# Patient Record
Sex: Female | Born: 1950 | Race: White | Hispanic: No | Smoking: Never smoker
Health system: Southern US, Community
[De-identification: ages and names within clinical notes are randomized; demographics above are authoritative.]

## PROBLEM LIST (undated history)

## (undated) DIAGNOSIS — E785 Hyperlipidemia, unspecified: Secondary | ICD-10-CM

## (undated) DIAGNOSIS — G4700 Insomnia, unspecified: Secondary | ICD-10-CM

## (undated) DIAGNOSIS — C801 Malignant (primary) neoplasm, unspecified: Secondary | ICD-10-CM

## (undated) DIAGNOSIS — N946 Dysmenorrhea, unspecified: Secondary | ICD-10-CM

## (undated) DIAGNOSIS — F419 Anxiety disorder, unspecified: Secondary | ICD-10-CM

## (undated) DIAGNOSIS — C779 Secondary and unspecified malignant neoplasm of lymph node, unspecified: Secondary | ICD-10-CM

## (undated) DIAGNOSIS — E2740 Unspecified adrenocortical insufficiency: Secondary | ICD-10-CM

## (undated) DIAGNOSIS — E538 Deficiency of other specified B group vitamins: Secondary | ICD-10-CM

## (undated) DIAGNOSIS — F329 Major depressive disorder, single episode, unspecified: Secondary | ICD-10-CM

## (undated) DIAGNOSIS — R609 Edema, unspecified: Secondary | ICD-10-CM

## (undated) DIAGNOSIS — Z9151 Personal history of suicidal behavior: Secondary | ICD-10-CM

## (undated) DIAGNOSIS — I10 Essential (primary) hypertension: Secondary | ICD-10-CM

## (undated) DIAGNOSIS — M5412 Radiculopathy, cervical region: Secondary | ICD-10-CM

## (undated) DIAGNOSIS — E271 Primary adrenocortical insufficiency: Secondary | ICD-10-CM

## (undated) DIAGNOSIS — Z915 Personal history of self-harm: Secondary | ICD-10-CM

## (undated) DIAGNOSIS — C4371 Malignant melanoma of right lower limb, including hip: Secondary | ICD-10-CM

## (undated) DIAGNOSIS — N943 Premenstrual tension syndrome: Secondary | ICD-10-CM

## (undated) DIAGNOSIS — Z9221 Personal history of antineoplastic chemotherapy: Secondary | ICD-10-CM

## (undated) DIAGNOSIS — F32A Depression, unspecified: Secondary | ICD-10-CM

## (undated) HISTORY — DX: Anxiety disorder, unspecified: F41.9

## (undated) HISTORY — PX: BREAST BIOPSY: SHX20

## (undated) HISTORY — PX: TUBAL LIGATION: SHX77

## (undated) HISTORY — DX: Premenstrual tension syndrome: N94.3

## (undated) HISTORY — DX: Secondary and unspecified malignant neoplasm of lymph node, unspecified: C77.9

## (undated) HISTORY — DX: Hyperlipidemia, unspecified: E78.5

## (undated) HISTORY — DX: Deficiency of other specified B group vitamins: E53.8

## (undated) HISTORY — DX: Insomnia, unspecified: G47.00

## (undated) HISTORY — DX: Radiculopathy, cervical region: M54.12

## (undated) HISTORY — PX: POLYPECTOMY: SHX149

## (undated) HISTORY — DX: Unspecified adrenocortical insufficiency: E27.40

## (undated) HISTORY — DX: Depression, unspecified: F32.A

## (undated) HISTORY — DX: Personal history of suicidal behavior: Z91.51

## (undated) HISTORY — DX: Personal history of self-harm: Z91.5

## (undated) HISTORY — DX: Essential (primary) hypertension: I10

## (undated) HISTORY — PX: TOTAL ABDOMINAL HYSTERECTOMY W/ BILATERAL SALPINGOOPHORECTOMY: SHX83

## (undated) HISTORY — DX: Dysmenorrhea, unspecified: N94.6

## (undated) HISTORY — DX: Malignant melanoma of right lower limb, including hip: C43.71

## (undated) HISTORY — PX: ABDOMINAL HYSTERECTOMY: SHX81

## (undated) HISTORY — DX: Personal history of antineoplastic chemotherapy: Z92.21

## (undated) HISTORY — DX: Edema, unspecified: R60.9

## (undated) HISTORY — PX: DILATION AND CURETTAGE OF UTERUS: SHX78

## (undated) HISTORY — PX: LUMBAR FUSION: SHX111

## (undated) HISTORY — DX: Primary adrenocortical insufficiency: E27.1

## (undated) HISTORY — PX: CHOLECYSTECTOMY: SHX55

## (undated) HISTORY — DX: Major depressive disorder, single episode, unspecified: F32.9

## (undated) SURGERY — PERIPHERAL VASCULAR THROMBECTOMY
Anesthesia: Moderate Sedation | Laterality: Left

---

## 1998-07-10 ENCOUNTER — Encounter: Admission: RE | Admit: 1998-07-10 | Discharge: 1998-10-09 | Payer: Self-pay | Admitting: *Deleted

## 1998-09-03 ENCOUNTER — Emergency Department (HOSPITAL_COMMUNITY): Admission: EM | Admit: 1998-09-03 | Discharge: 1998-09-03 | Payer: Self-pay | Admitting: Emergency Medicine

## 1998-10-15 ENCOUNTER — Encounter: Admission: RE | Admit: 1998-10-15 | Discharge: 1998-11-05 | Payer: Self-pay

## 1998-11-13 ENCOUNTER — Encounter: Admission: RE | Admit: 1998-11-13 | Discharge: 1999-02-11 | Payer: Self-pay | Admitting: Anesthesiology

## 1999-03-05 ENCOUNTER — Encounter: Admission: RE | Admit: 1999-03-05 | Discharge: 1999-04-11 | Payer: Self-pay | Admitting: Anesthesiology

## 1999-03-28 ENCOUNTER — Encounter: Payer: Self-pay | Admitting: Physical Medicine and Rehabilitation

## 1999-03-28 ENCOUNTER — Ambulatory Visit (HOSPITAL_COMMUNITY)
Admission: RE | Admit: 1999-03-28 | Discharge: 1999-03-28 | Payer: Self-pay | Admitting: Physical Medicine and Rehabilitation

## 1999-07-18 ENCOUNTER — Encounter: Admission: RE | Admit: 1999-07-18 | Discharge: 1999-08-22 | Payer: Self-pay | Admitting: *Deleted

## 1999-08-27 ENCOUNTER — Encounter: Admission: RE | Admit: 1999-08-27 | Discharge: 1999-09-11 | Payer: Self-pay | Admitting: *Deleted

## 1999-09-15 ENCOUNTER — Other Ambulatory Visit: Admission: RE | Admit: 1999-09-15 | Discharge: 1999-09-15 | Payer: Self-pay | Admitting: Internal Medicine

## 1999-10-20 ENCOUNTER — Encounter: Admission: RE | Admit: 1999-10-20 | Discharge: 1999-10-20 | Payer: Self-pay

## 1999-11-18 ENCOUNTER — Encounter
Admission: RE | Admit: 1999-11-18 | Discharge: 1999-12-09 | Payer: Self-pay | Admitting: Physical Medicine and Rehabilitation

## 1999-11-22 ENCOUNTER — Emergency Department (HOSPITAL_COMMUNITY): Admission: EM | Admit: 1999-11-22 | Discharge: 1999-11-23 | Payer: Self-pay | Admitting: *Deleted

## 1999-11-24 ENCOUNTER — Inpatient Hospital Stay (HOSPITAL_COMMUNITY): Admission: EM | Admit: 1999-11-24 | Discharge: 1999-12-01 | Payer: Self-pay

## 1999-11-24 ENCOUNTER — Encounter: Payer: Self-pay | Admitting: Orthopedic Surgery

## 1999-11-26 ENCOUNTER — Encounter: Payer: Self-pay | Admitting: Orthopedic Surgery

## 1999-11-27 ENCOUNTER — Encounter: Payer: Self-pay | Admitting: Orthopedic Surgery

## 2000-02-04 ENCOUNTER — Encounter: Payer: Self-pay | Admitting: Neurological Surgery

## 2000-02-04 ENCOUNTER — Encounter: Admission: RE | Admit: 2000-02-04 | Discharge: 2000-02-04 | Payer: Self-pay | Admitting: Neurological Surgery

## 2000-03-17 ENCOUNTER — Encounter: Admission: RE | Admit: 2000-03-17 | Discharge: 2000-03-17 | Payer: Self-pay | Admitting: Neurological Surgery

## 2000-03-17 ENCOUNTER — Encounter: Payer: Self-pay | Admitting: Neurological Surgery

## 2000-03-24 ENCOUNTER — Encounter: Payer: Self-pay | Admitting: Neurological Surgery

## 2000-03-24 ENCOUNTER — Encounter: Admission: RE | Admit: 2000-03-24 | Discharge: 2000-03-24 | Payer: Self-pay | Admitting: Neurological Surgery

## 2000-07-16 ENCOUNTER — Encounter: Admission: RE | Admit: 2000-07-16 | Discharge: 2000-09-22 | Payer: Self-pay | Admitting: Neurological Surgery

## 2000-10-14 ENCOUNTER — Other Ambulatory Visit: Admission: RE | Admit: 2000-10-14 | Discharge: 2000-10-14 | Payer: Self-pay | Admitting: Internal Medicine

## 2001-01-29 ENCOUNTER — Inpatient Hospital Stay (HOSPITAL_COMMUNITY): Admission: AD | Admit: 2001-01-29 | Discharge: 2001-02-01 | Payer: Self-pay | Admitting: *Deleted

## 2001-08-01 ENCOUNTER — Encounter: Payer: Self-pay | Admitting: Neurological Surgery

## 2001-08-01 ENCOUNTER — Encounter: Admission: RE | Admit: 2001-08-01 | Discharge: 2001-08-01 | Payer: Self-pay | Admitting: Neurological Surgery

## 2001-08-05 ENCOUNTER — Encounter: Payer: Self-pay | Admitting: Emergency Medicine

## 2001-08-05 ENCOUNTER — Inpatient Hospital Stay (HOSPITAL_COMMUNITY): Admission: EM | Admit: 2001-08-05 | Discharge: 2001-08-17 | Payer: Self-pay | Admitting: Emergency Medicine

## 2001-08-06 ENCOUNTER — Encounter: Payer: Self-pay | Admitting: Pulmonary Disease

## 2001-08-07 ENCOUNTER — Encounter: Payer: Self-pay | Admitting: Pulmonary Disease

## 2001-08-08 ENCOUNTER — Encounter: Payer: Self-pay | Admitting: Pulmonary Disease

## 2001-08-09 ENCOUNTER — Encounter: Payer: Self-pay | Admitting: Pulmonary Disease

## 2001-08-10 ENCOUNTER — Encounter: Payer: Self-pay | Admitting: Pulmonary Disease

## 2001-08-12 ENCOUNTER — Encounter: Payer: Self-pay | Admitting: Internal Medicine

## 2001-08-15 ENCOUNTER — Encounter: Payer: Self-pay | Admitting: Internal Medicine

## 2001-08-17 ENCOUNTER — Inpatient Hospital Stay (HOSPITAL_COMMUNITY): Admission: EM | Admit: 2001-08-17 | Discharge: 2001-08-23 | Payer: Self-pay | Admitting: Psychiatry

## 2001-08-24 ENCOUNTER — Other Ambulatory Visit (HOSPITAL_COMMUNITY): Admission: RE | Admit: 2001-08-24 | Discharge: 2001-08-29 | Payer: Self-pay | Admitting: Psychiatry

## 2001-10-07 ENCOUNTER — Encounter: Payer: Self-pay | Admitting: Neurological Surgery

## 2001-10-11 ENCOUNTER — Encounter: Payer: Self-pay | Admitting: Neurological Surgery

## 2001-10-11 ENCOUNTER — Inpatient Hospital Stay (HOSPITAL_COMMUNITY): Admission: RE | Admit: 2001-10-11 | Discharge: 2001-10-20 | Payer: Self-pay | Admitting: Neurological Surgery

## 2001-10-14 ENCOUNTER — Encounter: Payer: Self-pay | Admitting: Neurological Surgery

## 2001-10-17 ENCOUNTER — Encounter: Payer: Self-pay | Admitting: Neurological Surgery

## 2001-11-21 ENCOUNTER — Ambulatory Visit (HOSPITAL_COMMUNITY): Admission: RE | Admit: 2001-11-21 | Discharge: 2001-11-21 | Payer: Self-pay | Admitting: Neurological Surgery

## 2002-01-06 ENCOUNTER — Other Ambulatory Visit: Admission: RE | Admit: 2002-01-06 | Discharge: 2002-01-06 | Payer: Self-pay | Admitting: Internal Medicine

## 2002-06-05 ENCOUNTER — Inpatient Hospital Stay (HOSPITAL_COMMUNITY): Admission: EM | Admit: 2002-06-05 | Discharge: 2002-06-08 | Payer: Self-pay | Admitting: Psychiatry

## 2002-11-03 ENCOUNTER — Other Ambulatory Visit: Admission: RE | Admit: 2002-11-03 | Discharge: 2002-11-03 | Payer: Self-pay | Admitting: Internal Medicine

## 2003-02-06 ENCOUNTER — Encounter (INDEPENDENT_AMBULATORY_CARE_PROVIDER_SITE_OTHER): Payer: Self-pay | Admitting: Specialist

## 2003-02-07 ENCOUNTER — Inpatient Hospital Stay (HOSPITAL_COMMUNITY): Admission: RE | Admit: 2003-02-07 | Discharge: 2003-02-08 | Payer: Self-pay | Admitting: *Deleted

## 2004-02-12 ENCOUNTER — Other Ambulatory Visit (HOSPITAL_COMMUNITY): Admission: RE | Admit: 2004-02-12 | Discharge: 2004-05-12 | Payer: Self-pay | Admitting: Psychiatry

## 2004-02-13 ENCOUNTER — Ambulatory Visit: Payer: Self-pay | Admitting: Psychiatry

## 2004-04-26 ENCOUNTER — Inpatient Hospital Stay: Payer: Self-pay | Admitting: Internal Medicine

## 2004-05-16 ENCOUNTER — Ambulatory Visit (HOSPITAL_COMMUNITY): Admission: RE | Admit: 2004-05-16 | Discharge: 2004-05-16 | Payer: Self-pay | Admitting: Internal Medicine

## 2004-07-18 ENCOUNTER — Emergency Department (HOSPITAL_COMMUNITY): Admission: EM | Admit: 2004-07-18 | Discharge: 2004-07-18 | Payer: Self-pay | Admitting: Emergency Medicine

## 2005-06-10 ENCOUNTER — Encounter: Admission: RE | Admit: 2005-06-10 | Discharge: 2005-06-10 | Payer: Self-pay | Admitting: General Surgery

## 2005-06-10 ENCOUNTER — Encounter (INDEPENDENT_AMBULATORY_CARE_PROVIDER_SITE_OTHER): Payer: Self-pay | Admitting: Specialist

## 2005-12-28 ENCOUNTER — Encounter: Admission: RE | Admit: 2005-12-28 | Discharge: 2005-12-28 | Payer: Self-pay | Admitting: Internal Medicine

## 2006-07-06 ENCOUNTER — Inpatient Hospital Stay (HOSPITAL_COMMUNITY): Admission: RE | Admit: 2006-07-06 | Discharge: 2006-07-08 | Payer: Self-pay | Admitting: Psychiatry

## 2006-07-06 ENCOUNTER — Emergency Department (HOSPITAL_COMMUNITY): Admission: EM | Admit: 2006-07-06 | Discharge: 2006-07-07 | Payer: Self-pay | Admitting: *Deleted

## 2006-07-06 ENCOUNTER — Ambulatory Visit: Payer: Self-pay | Admitting: Psychiatry

## 2007-05-16 ENCOUNTER — Emergency Department (HOSPITAL_COMMUNITY): Admission: EM | Admit: 2007-05-16 | Discharge: 2007-05-17 | Payer: Self-pay | Admitting: Emergency Medicine

## 2007-05-22 ENCOUNTER — Emergency Department: Payer: Self-pay | Admitting: Emergency Medicine

## 2007-05-31 ENCOUNTER — Encounter: Admission: RE | Admit: 2007-05-31 | Discharge: 2007-05-31 | Payer: Self-pay | Admitting: Internal Medicine

## 2007-10-27 ENCOUNTER — Ambulatory Visit: Payer: Self-pay | Admitting: Internal Medicine

## 2008-07-31 ENCOUNTER — Ambulatory Visit: Payer: Self-pay | Admitting: Internal Medicine

## 2009-01-01 ENCOUNTER — Encounter: Admission: RE | Admit: 2009-01-01 | Discharge: 2009-01-01 | Payer: Self-pay | Admitting: Internal Medicine

## 2009-01-01 ENCOUNTER — Ambulatory Visit: Payer: Self-pay | Admitting: Internal Medicine

## 2009-05-09 ENCOUNTER — Inpatient Hospital Stay (HOSPITAL_COMMUNITY): Admission: EM | Admit: 2009-05-09 | Discharge: 2009-05-12 | Payer: Self-pay | Admitting: Emergency Medicine

## 2009-05-12 ENCOUNTER — Ambulatory Visit: Payer: Self-pay | Admitting: Psychiatry

## 2009-05-27 ENCOUNTER — Emergency Department (HOSPITAL_COMMUNITY): Admission: EM | Admit: 2009-05-27 | Discharge: 2009-05-27 | Payer: Self-pay | Admitting: Emergency Medicine

## 2009-05-28 ENCOUNTER — Ambulatory Visit: Payer: Self-pay | Admitting: Internal Medicine

## 2009-08-13 ENCOUNTER — Ambulatory Visit: Payer: Self-pay | Admitting: Internal Medicine

## 2009-12-31 ENCOUNTER — Ambulatory Visit: Payer: Self-pay | Admitting: Internal Medicine

## 2010-02-03 ENCOUNTER — Ambulatory Visit: Admit: 2010-02-03 | Payer: Self-pay | Admitting: Internal Medicine

## 2010-02-11 ENCOUNTER — Ambulatory Visit
Admission: RE | Admit: 2010-02-11 | Discharge: 2010-02-11 | Payer: Self-pay | Source: Home / Self Care | Attending: Internal Medicine | Admitting: Internal Medicine

## 2010-04-08 LAB — COMPREHENSIVE METABOLIC PANEL
ALT: 30 U/L (ref 0–35)
AST: 22 U/L (ref 0–37)
AST: 20 U/L (ref 0–37)
AST: 38 U/L — ABNORMAL HIGH (ref 0–37)
Albumin: 3.2 g/dL — ABNORMAL LOW (ref 3.5–5.2)
Albumin: 4 g/dL (ref 3.5–5.2)
BUN: 11 mg/dL (ref 6–23)
CO2: 25 mEq/L (ref 19–32)
CO2: 30 mEq/L (ref 19–32)
Calcium: 9.1 mg/dL (ref 8.4–10.5)
Calcium: 8.5 mg/dL (ref 8.4–10.5)
Creatinine, Ser: 0.79 mg/dL (ref 0.4–1.2)
Creatinine, Ser: 0.85 mg/dL (ref 0.4–1.2)
Creatinine, Ser: 0.86 mg/dL (ref 0.4–1.2)
GFR calc Af Amer: >60 mL/min (ref >60)
GFR calc non Af Amer: >60 mL/min (ref >60)
Glucose, Bld: 105 mg/dL — ABNORMAL HIGH (ref 70–99)
Glucose, Bld: 98 mg/dL (ref 70–99)
Potassium: 3.2 mEq/L — ABNORMAL LOW (ref 3.5–5.1)
Sodium: 140 mEq/L (ref 135–145)
Total Bilirubin: 0.5 mg/dL (ref 0.3–1.2)
Total Protein: 6.7 g/dL (ref 6.0–8.3)
Total Protein: 6.2 g/dL (ref 6.0–8.3)

## 2010-04-08 LAB — CBC
HCT: 38.3 % (ref 36.0–46.0)
HCT: 42.1 % (ref 36.0–46.0)
Hemoglobin: 14.2 g/dL (ref 12.0–15.0)
MCHC: 33.4 g/dL (ref 30.0–36.0)
MCHC: 33.7 g/dL (ref 30.0–36.0)
MCV: 91.4 fL (ref 78.0–100.0)
Platelets: 223 10*3/uL (ref 150–400)
Platelets: 282 10*3/uL (ref 150–400)
RDW: 13.8 % (ref 11.5–15.5)
RDW: 14.1 % (ref 11.5–15.5)
WBC: 7 10*3/uL (ref 4.0–10.5)
WBC: 13.2 10*3/uL — ABNORMAL HIGH (ref 4.0–10.5)

## 2010-04-08 LAB — HEPATIC FUNCTION PANEL
ALT: 24 U/L (ref 0–35)
Alkaline Phosphatase: 88 U/L (ref 39–117)
Bilirubin, Direct: <0.1 mg/dL (ref 0.0–0.3)
Total Bilirubin: 0.7 mg/dL (ref 0.3–1.2)
Total Protein: 6.1 g/dL (ref 6.0–8.3)

## 2010-04-08 LAB — URINALYSIS, ROUTINE W REFLEX MICROSCOPIC
Bilirubin Urine: NEGATIVE
Glucose, UA: NEGATIVE mg/dL
Glucose, UA: NEGATIVE mg/dL
Ketones, ur: NEGATIVE mg/dL
Nitrite: NEGATIVE
Protein, ur: NEGATIVE mg/dL
Specific Gravity, Urine: 1.024 (ref 1.005–1.030)
Specific Gravity, Urine: 1.013 (ref 1.005–1.030)
Specific Gravity, Urine: 1.018 (ref 1.005–1.030)
Urobilinogen, UA: 0.2 mg/dL (ref 0.0–1.0)
Urobilinogen, UA: 0.2 mg/dL (ref 0.0–1.0)
Urobilinogen, UA: 1 mg/dL (ref 0.0–1.0)
pH: 6 (ref 5.0–8.0)
pH: 6 (ref 5.0–8.0)

## 2010-04-08 LAB — DIFFERENTIAL
Basophils Relative: 0 % (ref 0–1)
Eosinophils Relative: 1 % (ref 0–5)
Eosinophils Relative: 5 % (ref 0–5)
Lymphocytes Relative: 16 % (ref 12–46)
Lymphs Abs: 1.2 10*3/uL (ref 0.7–4.0)
Lymphs Abs: 1.5 10*3/uL (ref 0.7–4.0)
Monocytes Absolute: 0.4 10*3/uL (ref 0.1–1.0)
Monocytes Absolute: 0.4 10*3/uL (ref 0.1–1.0)
Monocytes Relative: 5 % (ref 3–12)
Neutro Abs: 11.2 10*3/uL — ABNORMAL HIGH (ref 1.7–7.7)

## 2010-04-08 LAB — URINE MICROSCOPIC-ADD ON

## 2010-04-08 LAB — BASIC METABOLIC PANEL
CO2: 27 mEq/L (ref 19–32)
Calcium: 8.4 mg/dL (ref 8.4–10.5)
GFR calc non Af Amer: >60 mL/min (ref >60)
Glucose, Bld: 125 mg/dL — ABNORMAL HIGH (ref 70–99)
Potassium: 3.9 mEq/L (ref 3.5–5.1)

## 2010-04-08 LAB — MAGNESIUM: Magnesium: 2.5 mg/dL (ref 1.5–2.5)

## 2010-04-08 LAB — PHOSPHORUS: Phosphorus: 3.5 mg/dL (ref 2.3–4.6)

## 2010-04-08 LAB — RAPID URINE DRUG SCREEN, HOSP PERFORMED
Barbiturates: NOT DETECTED
Benzodiazepines: POSITIVE — AB
Cocaine: NOT DETECTED

## 2010-04-08 LAB — MRSA PCR SCREENING: MRSA by PCR: NEGATIVE

## 2010-05-12 ENCOUNTER — Ambulatory Visit (INDEPENDENT_AMBULATORY_CARE_PROVIDER_SITE_OTHER): Payer: Medicare Other | Admitting: Internal Medicine

## 2010-05-12 DIAGNOSIS — E785 Hyperlipidemia, unspecified: Secondary | ICD-10-CM

## 2010-05-12 DIAGNOSIS — M545 Low back pain: Secondary | ICD-10-CM

## 2010-06-06 NOTE — Discharge Summary (Signed)
[name], [name] NO.:  [id number]   MEDICAL RECORD NO.:  [id number]          PATIENT TYPE:  EMS   LOCATION:  ED                           FACILITY:  [facility]   PHYSICIAN:  Geoffery Lyons, M.D.      DATE OF BIRTH:  09/03/1950   DATE OF ADMISSION:  07/06/2006  DATE OF DISCHARGE:  07/07/2006                               DISCHARGE SUMMARY   CHIEF COMPLAINT AND HISTORY OF PRESENT ILLNESS:  This was the second  admission to [facility] for this 60 year old white  widowed female voluntarily admitted.  History of depression, suicidal  thoughts.  States she may have said or implied to her sister that she  wanted to die between Father's Day and husband's death.  She got very  upset with visions of the husband that wanted her to go with him.  Claimed compliance with medication.   PAST PSYCHIATRIC HISTORY:  Admitted to [facility] in  2004 due to depression and anxiety.  Sees Dr. Nolen Mu and sees Areta Haber for therapy.  Had prior history in 2004 of overdose on was  various medications.   HABITS:  Denies history as a use of any substances.   MEDICAL HISTORY:  Noncontributory.   MEDICATIONS:  1. Neurontin 300 three times a day.  2. Effexor-XR 375 mg per day.  3. Lasix 40 mg per day.  4. Lamictal 100 mg per day.  5. Ambien 10 mg at bedtime for sleep.  6. Klonopin 1 mg three times a day.   PHYSICAL EXAMINATION:  Performed and failed to show any acute findings.   LABORATORY WORK:  Not available in the chart.   MENTAL STATUS EXAM:  Reveals alert cooperative female.  Mood anxious.  Affect anxious.  Thought processes logical, coherent, and relevant  though she did imply that she was going to kill herself and was having a  very difficult time, still grieving the death of the husband.  No  delusions.  No active suicidal ideas, no hallucinations.  Cognition well-  preserved.   DIAGNOSIS:  AXIS I: Major depressive disorder.  AXIS II:  No diagnosis.  AXIS III:  Hypercholesterolemia.  AXIS IV: Moderate.  AXIS V:  On admission 30, highest in the last year 75.   COURSE IN THE HOSPITAL:  She was admitted.  She was started in  individual and group psychotherapy.  She was maintained on her  medications.  She endorsed that Father's Day was too hard,  father had  died in March 26, 2022, a baby- a grandniece died, and 5 years ago she lost  her husband.  Had a vision where she saw her father, husband and grand  niece and they seemed to ask do as her to come with them.  Living by  herself.  She has history of arterial hypertension and they had and the  blood pressure was extremely high when she was admitted.  She was  focusing that she thought the problem with the high blood pressure did  admit to the depression, mostly so, due to the death in the  family.  She  apparently took an overdose of pills five years prior to this admission  when her husband died and the daughter found her.  She had to be  resuscitated but she endorsed that she was not wanting to hurt herself.  On 07/08/2006, she was in full contact with reality.  There were again  no active suicidal or homicidal ideas, no hallucinations or delusions.  She continued to endorse that she needed a plan, that she was going to  herself.  She is grieving the death but endorsed she would never do it.  We spoke with Dr. Nolen Mu that was agreeable with the plan.  We went  ahead and discharged to outpatient follow-up.  She was going to follow  with Dr. Nolen Mu and Areta Haber.   DISCHARGE DIAGNOSIS:  AXIS I: Major depression recurrent.  AXIS II:  No diagnosis.  AXIS III:  Hypercholesterolemia.  Increased blood pressure.  AXIS IV: Moderate.  AXIS V:  On discharge, 60.   DISCHARGE MEDICATIONS:  1. Neurontin 300 three times a day.,  2. Effexor-XR 75 mg per day.  3. Lamictal 100 mg per day.  4. Klonopin 1 mg three times a day.  5. Risperdal 0.25 at night.  6. Lasix 40 mg twice  a day.  7. Ambien 10, one at bedtime as needed for sleep.   FOLLOW UP:  With Dr. Nolen Mu and Areta Haber.      Geoffery Lyons, M.D.  Electronically Signed     IL/MEDQ  D:  07/29/2006  T:  07/30/2006  Job:  [id number]

## 2010-06-06 NOTE — Discharge Summary (Signed)
Sarah [name], Sarah [name]                            ACCOUNT NO.:  [id number]   MEDICAL RECORD NO.:  [id number]                   PATIENT TYPE:  INP   LOCATION:  0478                                 FACILITY:  [facility]   PHYSICIAN:  Pershing Cox, M.D.            DATE OF BIRTH:  [birth year]   DATE OF ADMISSION:  02/06/2003  DATE OF DISCHARGE:  02/08/2003                                 DISCHARGE SUMMARY   ADMISSION DIAGNOSES:  1. Genital prolapse.  2. Abnormal bleeding.   DISCHARGE DIAGNOSES:  1. Genital prolapse.  2. Abnormal bleeding.   CONDITION ON DISCHARGE:  Stable and improved.   HISTORY:  For details of the patient's admission history and physical, see  the dictated note dated February 06, 2003.   HOSPITAL COURSE:  On the morning of admission, the patient was taken to the  operating room and under general anesthesia, exploratory laparotomy,  laparoscopic assisted vaginal hysterectomy, bilateral salpingo-oophorectomy,  anterior and posterior repair, and perineorrhaphy were performed.  The  procedure was uncomplicated.  On the evening of surgery the patient was  alert, conversant, using incentive spirometer well, and beginning to sip  liquids.  On the morning of postoperative day #1, she had no nausea, she had  normal vital signs.  Her laboratory data were normal.  She was begun on  liquids and then a diet.  She was begun on p.o. medication and was given  discharge instructions.  The patient was able to void.  An in-and-out cath  was less than a residual of 100.  She was able to be discharged that evening  to home.   DISCHARGE MEDICATIONS:  1. Percocet one p.o. q.4h. #40.  2. Darvocet one p.o. q.4h. #40.  3. Vivelle Dot 0.05 mg to change twice weekly.  4. KCL 10 mEq to take daily.   The patient had a problem with hypokalemia at the beginning of her  procedure.  Final pathology returned consistent with all benign diagnoses.  The patient had adenomyosis and a small  leiomyoma.                                               Pershing Cox, M.D.    MAJ/MEDQ  D:  02/20/2003  T:  02/20/2003  Job:  [id number]

## 2010-06-06 NOTE — Discharge Summary (Signed)
NAME:  Sarah [name], Sarah [name]                          ACCOUNT NO.:  [id number]   MEDICAL RECORD NO.:  [id number]                   PATIENT TYPE:  INP   LOCATION:  5034                                 FACILITY:  [facility]   PHYSICIAN:  Luanna Cole. Lenord Fellers, M.D.                DATE OF BIRTH:  02/13/1950   DATE OF ADMISSION:  08/05/2001  DATE OF DISCHARGE:  08/17/2001                                 DISCHARGE SUMMARY   FINAL DIAGNOSES:  1. Multidrug overdose - suicide attempt.  2. Depression.  3. Bilateral aspiration pneumonia.  4. Chronic severe low back pain for three years secondary to degenerative     disk disease with prior history of discitis.   HISTORY:  This 60 year old white female with chronic severe lumbar back pain  for the past three years was found at home by her daughter on the morning of  August 05, 2001, after an apparent drug overdose in a suicide attempt.  She  left a note for her daughter.  The patient says that she overdosed on  Ambien, Seroquel, and Restoril.  The patient made a similar gesture in  December 2002.  At that time, her husband was able to stop her from  ingesting pills in a suicide attempt.  He took her to the [facility] where she apparently was hospitalized for three days.  I did not know  about this admission until she came to my office in the spring of this year  and told me.  Her husband was dying with pancreatic cancer at the time.  He  past away four months ago.  The patient has been very depressed.  She and  her husband had a good marriage.  It was a big loss for her.  Her only  child, a daughter, is married and has been supportive.  The patient has  chronic lumbar back pain and currently is under the care of Dr. Barnett Abu  and Dr. Thyra Breed, pain management specialists.  This stems from a fall  she suffered at work during 2000.  She apparently tripped on an object left  on some stairs.  She has been tried on multiple medications without  much  relief from the chronic low back pain according to Dr. Vear Clock.  She is  seeing a psychologist in his office.  Previously was seen by Dr. Elna Breslow  and most recently has been maintained on Lexapro and Seroquel.  She has  complained bitterly of insomnia secondary to back pain.  Has been tried on  Ambien and temazepam (Restoril).  The patient says that she tried to take  her life because of severe back pain.  She felt that her chronic back pain  with resultant disability over the past three years was unbearable.  She is  ambulating with a walker at this point.  She is unable to return to work  because  of the severe back pain.  She has had to limit her activities.  Previously was a very active person and worked full-time job with [employer].  I have been the patient's primary  care physician for a number of years but was not involved initially with  this hospitalization.   HOSPITAL COURSE:  When she presented to the emergency department, I was  contacted by Dr. Bruce Donath, who indicated  she had probable aspiration  pneumonia and had to be intubated because of the multi-drug overdose.  We  then referred the patient to the critical care service where she remained in  the intensive care unit under their care until August 10, 2001, when I was  asked to assume her care.  She ws transferred to the floor from the ICU on  August 10, 2001.  While in the ICU, she was treated for aspiration pneumonia  initially with clindamycin and then subsequently Flagyl and Tequin.  She has  bilateral infiltrates which have been very slow to resolve.  Most recent  chest x-ray was done August 15, 2001, showing a confluent infiltrate in the  posterior and left lower lobe without significant change.  There was  interval improvement in infiltrate in the right lower lobe.  The heart size  is normal.  She did have a percutaneous intravenous central catheter (PICC)  line placed by  radiology in the basilic vein right forearm on August 08, 2001,  for IV access.  This was pulled by the IV team on August 16, 2001.  The  patient currently is on room air without the need for oxygen with just  ordinary activity.  However, it is unlikely the patient will be able to do  much physical activity for the next several weeks, and certainly she is very  limited in what she can do because of her chronic low back pain and need to  ambulate with a walker.  The patient most recently has been somewhat  hypokalemic.  It is not clear why she was so hypokalemic except for frequent  stools approximately four times a day while on antibiotic therapy.  The  diarrhea subsided with use of Imodium AD.  She also had very poor intake,  p.o., over the past week, so she had no appetite to speak of but has been  doing better with oral intake over the last couple of days.  Arterial blood  gas on room air was checked on August 15, 2001, and her PO2 was found to be  58.4 on room air with a pH of 7.48 and a PCO2 of 31.5.  Despite this blood  gas, she looks much better clinically.  She is not short of breath at rest  or with minimal exertion.  She does not seem to need oxygen, and her O2  saturations have been in the mid 90% range.  Her hypokalemia responded to  oral potassium and a small amount of IV potassium.  Potassium rechecked on  August 16, 2001, was normal at 3.7.  The patient did have some elevated serum  glucoses, i.e., 145 on August 15, 2001, but this was a nonfasting glucose  drawn at noon.  Her calcium has been low, but that is secondary to her low  albumin.  Once her albumin corrects with proper nutrition, her calcium  should be just fine.  BUN and creatinine have been normal.  Sodium has been  within normal range.  We did check a  glycosylated hemoglobin to rule out  diabetes mellitus, and that also has proven to be normal.  For complaint of  diarrhea, we did a stool for C. difficile toxin, and that was  negative.  The patient currently is on Flagyl 500 mg q.6h.  She should continue this for  another seven days, and then it can be discontinued.  Dr. Shelle Iron had  maintained her on Protonix 40 mg daily, and I think that is a good idea for  now.  I restarted her Lexapro, but she currently is only on 10 mg daily and  previously I believe was on 40 mg daily.  She is also on Tequin 400 mg daily  for pneumonia and should continue with this for an additional 7 days and  then have it discontinued.  She is also on Fenesin (guaifenesin) 600 mg po  b.i.d., although she has very little sputum production at this point.  This  should be continued for 7 more days and then discontinued.  She is currently  on Ambien 10 mg h.s.  I have taken Dr. Teddy Spike approach and limited her  pain medication.  I did allow her to have some Hycodan 1 tsp. p.o. q.8h. for  cough.  The patient complained bitterly she was unable to sleep secondary to  cough; however, I have not heard much coughing while I was in the room with  her.  She did have some unusual lesions on both feet, her left forearm, and  forehead.  These were seen in consultation by Dr. Amy Swaziland, dermatologist.  It is not clear if this represents some type of trauma or some adverse drug  reaction.  Consideration was given to biopsy of these lesions, but Dr.  Swaziland indicates in her note that the patient decided against a skin biopsy.  Dr. Swaziland suggested that these lesions be treated with Lidex cream 0.05%,  and we have been applying this cream to these lesions b.i.d. and cleaning  these lesions well with peroxide and dressing them b.i.d.  Also, Dr. Shelle Iron  had ordered Ultram 1 p.o. t.i.d. for pain along with extra-strength Tylenol  p.r.n.  I have ordered a multivitamin with iron daily, and that should be  continued until her nutritional status has improved.  She has been receiving  incentive spirometry - has this device at the bed side and should continue   with that an additional seven days.  She is going to need follow-up with  either Dr. Shelle Iron or myself in approximately four weeks and will need repeat  chest x-ray at that time.  Dr. Danielle Dess stopped by to see her and indicated he  would like to follow up with her again once she has been discharged from the  [facility].  The patient exhibits, I think, dependent  personality traits.  She said that Dr. Danielle Dess has in mind some type of  spinal fusion surgery.  She seems to think that that is going to take care  of her problem.  I have told her that it would not be wise to count on this  procedure, and she must come to terms with the possibility of having long-  term chronic low back pain affecting some of her activities.  Her daughter  has expressed to me some frustrations.  She has been sad and also angry with  her mother.  She is angry that she found her mother lying in bed in a small  pool of blood.  Apparently the patient  had vomited and aspirated after taking the overdose.  The daughter works a part-time job has a Interior and spatial designer  and also has a Astronomer.  Indicates that she is very concerned that the  patient may try another suicide attempt and this time thinks that it will be  very devastating.  She is very concerned about her mother's mood swings,  raising the possible question of bipolar disorder.  She indicates that her  mother has had multiple containers of narcotics and other drugs in the house  for some time and seems to have them hidden.  I have asked the patient's  daughter to go to the home and remove any guns or medications that could  possibly be used in a suicide attempt.  I do not know if this has been done.  The patient's mother has a history of psychiatric illness.  Both of her  parents are still living and reside in [place] near [place].  She wants to  return to their home after discharge from [facility], but I do  not know if that is wise.  I do  not know if they have guns or medications  available in their home.  The patient has been worried quite a bit about an  impending case with [facility].  Apparently, she has employed a Clinical research associate,  Mr. Levonne Spiller Dinnon, who is handling her workman's compensation case against  the county.  The patient has been on disability after a fall at work some  three years ago and has a Teacher, music.  She is also  worried with details of settling her husband's estate.  She says a Conservation officer, historic buildings came to call on her recently and assured her that things would be  okay, but she indicates she has had to borrow money from her parents and  siblings to make house payments, etc.  The patient has been on a regular  medical floor since August 10, 2001, on suicide precautions and has had a  sitter with her at all times.  I might add she was on the ventilator in the  ICU from August 05, 2001 until August 08, 2001, at which time she was extubated  and has done well.  The patient says that she is not going to try suicide  again, but her daughter is very skeptical about this.  She told a number of  people in the ICU that she would try suicide again, and this is very  concerning.  Apparently over the past three years, the patient has tried  epidural steroid injections which did not work.  There was a procedure done  on her back in [place] by an orthopedist, Dr. Alois Cliche, which also  failed to provide pain relief.  She continued to have severe pain and was  later found here to have a discitis which was treated with antibiotics.  Dr.  Danielle Dess has followed her closely over the past several months and has found  no recurrence of the discitis, but the patient relates that she has  degenerative disk disease and "arthritis."  She is unable to sit in a chair  for long periods of time, so she cannot get comfortable in the bed or in any  chair that was provided here at the hospital.  However, when I have been  in to see her on rounds, she does not appear to be in any extreme pain, and we  have just been maintaining her on small amounts of  Hycodan and Ultram.  The  patient says she likes her psychologist in Dr. Thyra Breed' office very  much and would like to continue with that outpatient counseling.  I might  add that the patient has a prior history of hypertension.  She had been  maintained on Maxzide in the past, but her blood pressure here in the  hospital has been normal, and we have not restarted that medication.  Thus,  the patient is being discharged to the [facility].  It is her  daughter's hope that the patient will be kept at the [facility] for a period of several weeks to get the proper help.  She is very  concerned that the patient will be discharged "too soon."  I have explained  to the patient's daughter that length of stay may well depend upon her  insurance coverage and other factors.  I have assured her that the  [facility] will take every precaution and do a good job of  evaluating the patient.  She has mentioned possibility of having the patient  admitted elsewhere but has not done anything that I am aware of to look into  any other facilities.  I have not actually not seen her at the hospital, but  I know that she has been by to visit the patient a number of times.   DISCHARGE MEDICATIONS:  1. Lexapro 10 mg q.d.  2. Protonix 40 mg q.d. which should be continued while the patient is at the     [facility].  3. Flagyl 500 mg p.o. q.6h. x 7 more days, then discontinue.  4. Tequin 400 mg q.d. x 7 more days, then discontinue.  5. Fenesin 600 mg p.o. b.i.d. x 7 more days, then discontinue.  6. Ambien 10 mg p.o. h.s.  7. Lidex 0.05% cream to skin lesions b.i.d. until healed.  8. Hycodan 1 tsp. p.o. q.8h. p.r.n. cough x 7 days, then would recommend     discontinuing it.  9. Ultram 1 p.o. t.i.d. for back pain.  10.       Multivitamin with iron daily.  11.      Extra-Strength Tylenol 2 p.o. q.i.d. p.r.n. pain.   DIET SUGGESTED:  A 4 gram sodium, fat modified.  No glucose restrictions.    DISCHARGE INSTRUCTIONS:  The patient has incentive spirometer at bedside,  and that should be used q.i.d. for seven more days, and then it can be  discontinued.  She will need a follow-up chest x-ray in four weeks.  If the  [facility] physicians have any questions about her general medical  care, please feel free to contact me.  Pulmonary questions can be directed  to Dr. Marcelyn Bruins.                                               Luanna Cole. Lenord Fellers, M.D.    MJB/MEDQ  D:  08/16/2001  T:  08/16/2001  Job:  [id number]   cc:   Barbaraann Share, M.D.   Adelene Amas. Will[place]ord, M.D.   [name], M.D.   Kathrin Penner. Vear Clock, M.D.

## 2010-06-06 NOTE — H&P (Signed)
[facility]  Patient:    Sarah [name], BECHTEL Visit Number: [id number] MRN: [id number]          Service Type: PSY Location: 300 0300 01 Attending Physician:  Rachael Fee Dictated by:   Candi Leash. Orsini, N.P. Admit Date:  01/29/2001 Discharge Date: 02/01/2001                     Psychiatric Admission Assessment  DATE OF ADMISSION:  January 29, 2001  PATIENT IDENTIFICATION:  This is a 60 year old married white female voluntarily admitted on January 29, 2001, for depression and suicidal thoughts.  HISTORY OF PRESENT ILLNESS:  The patient presents with a history of depression and suicidal ideation.  The patient was told that during her last visit with her M.D. that her back pain was not going to improve and that she would just have to live with it like it is.  The patient was feeling very hopeless and helpless and felt she was unable to take care of things like she used to such as caring for her husband who was ill.  The patient was having suicidal thoughts to overdose, was stopped by her husband.  The patient stated she had been isolating herself and experiencing anxiety in crowds.  Her sleep had been decreased.  Her appetite had decreased.  She had a 30 pound weight loss. Denied any psychotic symptoms.  PAST PSYCHIATRIC HISTORY:  First visit to [facility].  No other hospitalization.  On an outpatient basis sees Dr. Elna Breslow; last visit was on Tuesday of this week.  No prior suicide attempts.  SUBSTANCE ABUSE HISTORY:  She is a nonsmoker.  She denies any alcohol or substance abuse.  PAST MEDICAL HISTORY:  Primary care Sharifah Champine: Luanna Cole. Lenord Fellers, M.D. in [place].  Medical problems: Chronic back pain, hypertension.  Sees [name], M.D. for her back pain.  MEDICATIONS: 1. Celexa 20 mg q.h.s. 2. Lexapro 5 mg q.a.m.; the patient is titrating her Celexa and increasing her    Lexapro. 3. Xanax 2 mg p.o. b.i.d. 4. Maxzide 37.5  mg q.d. 5. Percocet 10/325 mg every four hours p.r.n. for pain.  DRUG ALLERGIES:  PENICILLIN, OXYCONTIN.  PHYSICAL EXAMINATION:  GENERAL:  Performed at [facility] Emergency Department.  The patient does walk with a walker.  LABORATORY DATA:  Urine drug screen was positive for benzodiazepines.  CBC was within normal limits.  Potassium initially was 2.9, is up to 3.4.  Urine pregnancy test was negative.  SOCIAL HISTORY:  She is a 60 year old married white female married for 30 years, one child.  Lives with her husband.  Her husband has liver cancer.  FAMILY HISTORY:  Mother with depression, is on Celexa.  MENTAL STATUS EXAMINATION:  Alert, middle-aged Caucasian female cooperative with good eye contact.  Speech is normal and relevant.  Mood is depressed. Affect is sad, depressed, crying, flat.  Thought processes are coherent; No evidence of psychosis, no auditory or visual hallucinations, no suicidal or homicidal ideations, no paranoia.  Cognitive: Intact.  Judgment is fair. Insight is fair.  Memory is good.  ADMISSION DIAGNOSES: Axis I:    Major depressive disorder. Axis II:   Deferred. Axis III:  Chronic back pain. Axis IV:   Problems with primary support group and medical problems. Axis V:    Current is 30, estimated this past year is 70.  INITIAL PLAN OF CARE:  Plan is a voluntary admission to [facility] for depression and  suicidal thoughts.  Contract for safety.  Check every 15 minutes.  Will resume her routine medications.  Will replace her potassium and check her laboratories.  The patient is considered to be a high fall risk, is to use her walker as needed.  Will continue to taper her Celexa and titrate her Lexapro.  Goal is to stabilize her mood and thinking so the patient can be safe, to follow up with Dr. Katrinka Blazing.  ESTIMATED LENGTH OF STAY:  Three to five days.Dictated by:   Candi Leash. Orsini, .P. Attending Physician:  Rachael Fee DD:  02/01/01 TD:   02/01/01 Job: [id number]

## 2010-06-06 NOTE — Op Note (Signed)
NAME:  Sarah [name], Sarah [name]                          ACCOUNT NO.:  [id number]   MEDICAL RECORD NO.:  [id number]                   PATIENT TYPE:  INP   LOCATION:  3014                                 FACILITY:  [facility]   PHYSICIAN:  [name], M.D.               DATE OF BIRTH:  Jul 09, 1950   DATE OF PROCEDURE:  10/17/2001  DATE OF DISCHARGE:                                 OPERATIVE REPORT   PREOPERATIVE DIAGNOSES:  Inadequate fixation, L4, L5 and sacrum with pedicle  screws.   POSTOPERATIVE DIAGNOSES:  Inadequate fixation, L4, L5 and sacrum with  pedicle screws.   OPERATION PERFORMED:  Revision of pedicle fixation, L4, L5 and sacrum.   SURGEON:  [name], M.D.   ASSISTANT:  None.   ANESTHESIA:  General endotracheal.   INDICATIONS FOR PROCEDURE:  The patient is a 60 year old individual who  underwent posterior interbody fusion and minimally invasive pedicular  fixation from L4 to the sacrum.  The patient developed postoperative right  leg pain and follow-up films demonstrated that there was cutout of the L5  and the S1 screws medially on the right side.  The patient was advised  regarding the problem and is returned now to the operating room to undergo  revision of her fixation.   DESCRIPTION OF PROCEDURE:  The patient was brought to the operating room  supine on the stretcher.  After smooth induction of general endotracheal  anesthesia, she was turned prone, the back was prepped with alcohol and  after removing the Dermabond dressing, it was then prepped with DuraPrep.  The three stab incisions on the right side from the percutaneous pedicle  screw placement were connected to open incision approximately 8 cm in  length.  Dissection was then continued through the subcutaneous fascia until  the pedicle screw heads were identified.  These areas were then  decompressed.  The screw heads were removed and the pedicle screws were  sequentially removed.  Pedicle entry sites  were revised at L5 and the  sacrum.  However, at L5, no gross cutout could be identified.  At the  sacrum, a cutout could be identified inferiorly and medially.  Screws were  placed further laterally and superiorly and then sounded with pedicle probe.  Localizing radiographs were taken to identify adequate placement of the  holes and they were tapped with a 5.5 mm tap and then the screws at L4, L5  and the sacrum were replaced into the original positions.  At L4 the screw  was removed so that the hole could be probed.  No cut out was noted.  The  inferior margin of the facet which was noted on the CT scan to have a small  crack in it was resected and then by addressing the bone in this region, the  screw was replaced and the rod was reconnected and retorqued into its final  position.  The area was copiously irrigated with antibiotic irrigating  solution and then the lumbodorsal fascia was closed with  #1 Vicryl in interrupted fashion and 2-0 Vicryl was used in the subcutaneous  tissues and subcuticular tissues.  Dermabond was used on the skin.  The  patient tolerated the procedure well and was returned to the recovery room  in stable condition.                                                 [name], M.D.    Merla Riches  D:  10/17/2001  T:  10/18/2001  Job:  [id number]

## 2010-06-06 NOTE — Op Note (Signed)
Sarah [name], Sarah [name]                            ACCOUNT NO.:  [id number]   MEDICAL RECORD NO.:  [id number]                   PATIENT TYPE:  AMB   LOCATION:  DAY                                  FACILITY:  [facility]   PHYSICIAN:  Pershing Cox, M.D.            DATE OF BIRTH:  [birth year]   DATE OF PROCEDURE:  02/06/2003  DATE OF DISCHARGE:                                 OPERATIVE REPORT   PREOPERATIVE DIAGNOSIS:  Genital prolapse and dysfunctional uterine bleeding  with endometrial polyp on sonogram.   POSTOPERATIVE DIAGNOSIS:  Genital prolapse and dysfunctional uterine  bleeding with endometrial polyp on sonogram.   OPERATION/PROCEDURE:  1. Diagnostic laparoscopy.  2. Laparoscopic-assisted vaginal hysterectomy.  3. Bilateral salpingo-oophorectomy.  4. Anterior and posterior repair.  5. Perineorrhaphy.   ANESTHESIA:  General endotracheal anesthesia.   SURGEON:  Pershing Cox, M.D.   ASSISTANT:  Richardean Sale, M.D.   INDICATIONS FOR PROCEDURE:  Sarah [name] presented to my office with  dysfunctional uterine bleeding and heavy vaginal flow.  She underwent  endometrial biopsy which was benign.  There was a filling defect in the  uterus consistent with a polyp.  Her cervix descended to her introitus.  Because of her abnormal bleeding, a plan was made to bring her to the  operating room to remove her uterus.  She requested that she have salpingo-  oophorectomy at the same time and, therefore, laparoscopic approach was  planned.  She had a small cystocele and a very large rectocele and was  scheduled for anterior posterior repair.   OPERATIVE FINDINGS:  The patient's uterus was approximately 8-10 weeks in  size.  The ovaries were normal in appearance and slightly atrophic.  The  patient had a hypertrophic, elongation of her cervix with the cervix being  at least 10 cm in length.  This complicated the surgery significantly,  elongating the entry into both anterior and  posterior cul-de-sacs and  requiring at least an additional hour to an hour-and-a-half of dissection to  complete the procedure.   DESCRIPTION OF PROCEDURE:  The patient was brought to the operating room  with an IV in place.  In the holding area, she received 500 mg of vancomycin  in preparation for surgery.  She also had thigh-high PAS stockings placed.  Supine on the OR table, endotracheal anesthesia was administered.  She was  then placed in the Franklin stirrups and examination under anesthesia was  performed.  Hibiclens was used to prep the anterior abdominal wall and  vagina.  Sterile linens were used to drape for sterile vaginal procedure.   We entered the abdomen using an open scope technique.  A previous  subumbilical incision was opened and brought down to the fascia which was  identified and divided.  We entered the peritoneum atraumatically and a  pursestring suture was placed around the umbilicus and attached to the  Hasson cannula.  The patient was placed into steep Trendelenburg.  She had had a Hulka  tenaculum placed into her uterine cavity for manipulation.  After looking at  the uterus, we decided that it was feasible to proceed with the operation  and two 5 mm ports were placed in the lower abdomen under direct  visualization.  Marcaine anesthesia was used for both the subumbilical and  the lower abdominal ports.   The tripolar cautery was used to complete our incision and cautery.  We  began on the patient's left side.  The infundibulum ligament was lifted and  we could see the ureter and its movement.  Infundibulopelvic ligament was  coagulated.  Mesosalpinx beneath the ovary was coagulated bringing [place] to the  level of the uterus.  Just above the uterine artery stopped the dissection.  The peritoneum was lifted superiorly and separated from the lower uterine  segment, again using the tripolar cautery.   The procedure was identical on the patient's left.  However,  on this side  there was some bleeding from the infundibulopelvic ligament and it had  retracted significantly.  Attempts to complete the cauterization  were  probably adequate.  However, my lack of security in this area lead me to  place several clips along the top of this infundibulopelvic ligament.  There  was no evidence of bleeding from this during the remainder of this  procedure, even after we deflated our pneumoperitoneum.  On the right side  of the pelvis, the ureter was identified and the infundibulopelvic ligament  was transected in a manner identical to the left.  We brought the dissection  down to the level of the uterine artery and connected it to the peritoneal  dissection which separated the bladder.  Using the tripolar cautery, the  bladder was separated using blunt dissection.  This lifted it off the lower  uterine segment.  At this point, we decided to go below and complete the  operation.   Weighted vaginal speculum was placed in the vagina and Gerilyn Pilgrim tenaculums were  placed on the anterior and posterior cervix.  Dilute Pitressin solution was  infiltrated in the pericervical tissues and cautery was used to incise  approximately 2 cm from the exocervix.  The midline was marked carefully for  the beginning of our anterior repair.  From this point, we began to have  significant difficulty because of the length of the patient's cervix.  The  uterosacral ligaments could not be adequately identified and, therefore, I  was unable to find the cul-de-sac of  Douglas.  Clamps were placed on the  posterior vaginal wall in an area where I felt the uterosacral ligaments  came closest to approximation and I began a dissection here.  The dissection  of the posterior cul-de-sac took approximately 45 minutes.  This was a very  lengthy procedure as we were trying very hard to enter the cul-de-sac of Douglas without injuring the underlying rectum.  Peritoneum had been  significantly  advanced by the growth of the cervix.  I was finally able to  enter the cul-de-sac of Douglas and when I did, the peritoneum was then  identified and sewn to the incision on the posterior vaginal wall.  At this  point, I was able to identify the uterosacral ligaments.  It was impossible  to use a curved clamp and, therefore, a straight Heaney was used to clamp  both of these ligaments.  They were held until later in the case and a  second bite was taken to be certain that I had uterosacral ligaments in my  grasp.  These were later sewn together.   At this point, I was able to go to the front and enter the anterior cul-de-  sac and the bladder was protected by placing a narrow Deaver and lifting the  bladder.   The LigaSure was then used to dissect the lateral walls from the uterosacral  ligaments to the area of the utero-ovarian artery.  The uterine arteries  were individually identified on each side and coagulated separately.  Once  these had been coagulated, the cervix was excised from the body of the  uterus.  I was not able to reach the upper attachments of the broad ligament  and, therefore, the uterus was bivalved.  The previously noted polyp was  seen as this was completed.  I was then able to coagulate the residual  pieces of tissue using the LigaSure.   There was a moderate amount of bleeding from the left side and there were no  specific bleeders seen.  Therefore, figure-of-eight suture was placed with 0  Vicryl suture.  The remainder of the sidewalls were visualized and there was  no evidence of bleeding.  Uterosacral ligaments were then identified and a  McCall stitch was placed from uterosacral to uterosacral webbing across the  peritoneum between these two.  This was tied.  The peritoneum beneath the  bladder was identified and attached to the anterior vagina.  The vagina was  then closed with interrupted sutures of 0 Vicryl.  The uterosacral ligaments  were tied together  in the midline.  Estrace-soaked packing was used to pack  the vagina.   At this point, anterior repair was completed.  There was a very small  cystocele.  An incision was made on just above the closure of the vagina and  the anterior vaginal mucosa was separated from the bladder a length of  approximately 3.5-4 cm.  I stopped 1 cm below the urethra.  The bladder was  dissected off the vaginal wall to a length of approximately 2 cm on each  side.  A pursestring suture buried the bladder and the redundant vaginal  mucosa was then excised and the vaginal edges were brought together with  interrupted 0 Vicryl suture.   Vasopressin was injected into the perineum.  The patient had virtually no  perineal body.  The posterior vaginal wall was also infiltrated with this  solution.  Allis clamps were used to grasp the lateral edges of what should be the perineal body.  A knife was used to incise a very shallow triangle of  skin here.  Using Allis clamps then the vaginal mucosa was lifted and using  the Strully scissors and sharp dissection, the mucosa was separated from the  underlying rectum.  This was a Producer, television/film/video.  The total length was  approximately 10 cm prior to the completion.  Placing Allises along the  vaginal margin, the lateral vaginal walls were lifted and dissected from the  underlying rectum.  The rectum was made hemostatic and then a very large  pursestring suture was placed to invert it.  Interrupted mattress sutures  were then placed over the top of the pursestring repair.  Redundant vaginal  mucosa was excised and discarded.  0 Vicryl suture was used to close the  posterior vaginal wall over the rectal repair.  This was closed down to the  level of the hymen. The perineum was then  brought together with interrupted  0 Vicryl sutures.  2-0 Vicryl suture was used to close the skin over this  area.   At this point we went above again and after creating a pneumoperitoneum,  were  able to visualize the repair.  There was no evidence of bleeding from  the residual left infundibulopelvic ligament.  There was very little  bleeding from the vaginal cuff.  The tripolar cautery was used here to  obtain good hemostasis.  The gas was evacuated and under direct  visualization all of our operative sites were carefully inspected.  There  was no evidence of bleeding.                                               Pershing Cox, M.D.    MAJ/MEDQ  D:  02/06/2003  T:  02/06/2003  Job:  [id number]   cc:   Luanna Cole. Lenord Fellers, M.D.  [address], [place]  [place]  [place] [address]  Fax: [phone]   Richardean Sale, M.D.

## 2010-06-06 NOTE — Discharge Summary (Signed)
[facility]  Patient:    Sarah [name], Sarah [name] Visit Number: [id number] MRN: [id number]          Service Type: PSY Location: 300 0300 01 Attending Physician:  Rachael Fee Dictated by:   [name] Dub Mikes, M.D. Admit Date:  01/29/2001 Discharge Date: 02/01/2001                             Discharge Summary  CHIEF COMPLAINT AND HISTORY OF PRESENT ILLNESS:  This was the first admission to [facility] for this 60 year old female admitted for depression and suicidal thoughts.  Presented with a history of depression. Told during her last visit with her M.D. that her back pain was not going to improve and that she would just have to live with it.  The patient was feeling very hopeless and helpless, said she was unable to take care of things like she used to do such as caring for her husband who was ill.  The patient was having suicidal thoughts to overdose, was stopped by her husband.  The patient stated that she had been isolating herself and experiencing anxiety.  Sleep has been decreased.  Appetite has decreased.  She has had a 30 pound weight loss.  Denied any psychotic symptoms.  PAST PSYCHIATRIC HISTORY:  First time at [facility].  Sees Dr. Elna Breslow for outpatient treatment.  SUBSTANCE ABUSE HISTORY:  Denies alcohol or drugs.  PAST MEDICAL HISTORY: 1. Arterial hypertension. 2. Chronic back pain.  MEDICATIONS ON ADMISSION: 1. Celexa 20 mg per day. 2. Lexapro 5 mg in the morning. 3. Xanax 2 mg twice a day. 4. Maxzide 37.5 mg every day. 5. Percocet 10/325 mg every four hours as needed.  DRUG ALLERGIES:  Denied any drug allergies.  PHYSICAL EXAMINATION:  GENERAL:  Performed and failed to show any acute findings.  MENTAL STATUS EXAMINATION ON ADMISSION:  Well-nourished, well-developed female, good eye contact.  Speech was normal and relevant.  Mood was depressed.  Affect was depressed, crying, flat.  Thought  processes: Coherent, no evidence of psychosis, no auditory or visual hallucinations, no suicidal or homicidal ideation, no paranoia.  Cognitive: Well preserved.  ADMITTING DIAGNOSES: Axis I:    Major depression. Axis II:   No diagnosis. Axis III:  Chronic back pain. Axis IV:   Moderate. Axis V:    Global assessment of functioning upon admission 30, highest global            assessment of functioning in the last year 70.  HOSPITAL COURSE:  She was admitted and started in intensive individual and group psychotherapy.  In counseling she was able to address the stressors the way she felt about the situation that she has had at home.  She did some grieving having to do with her physical health.  We went ahead and finally discontinued Celexa and increased Lexapro to 10 mg per day.  She was given Seroquel 25 mg at bedtime to help with sleep.  By January 14, she was much better, in full contact with reality, mood improved, affect brighter, no suicidal ideas, no homicidal ideas, willing and motivated to pursue further outpatient treatment, more accepting of her situation and willing to work further on adjusting to it.  No suicidal ideas, no homicidal ideas for which discharge was considered and granted.  DISCHARGE DIAGNOSES: Axis I:    Major depression. Axis II:   No diagnosis. Axis III:  Chronic back pain. Axis IV:   Moderate. Axis V:    Global assessment of functioning upon discharge 55-60.  DISCHARGE MEDICATIONS: 1. Xanax 2 mg twice a day. 2. Maxzide 37.5/25 mg. 3. Zocor 20 mg daily. 4. Peri-Colace one twice a day. 5. Lexapro 10 mg daily. 6. Seroquel 25 mg at bedtime.  FOLLOWUP:  Dr. Elna Breslow. Dictated by:   [name] Dub Mikes, M.D. Attending Physician:  Rachael Fee DD:  03/09/01 TD:  03/10/01 Job: [id number]

## 2010-06-06 NOTE — Discharge Summary (Signed)
NAME:  Sarah [name], Sarah [name] NO.:  [id number]   MEDICAL RECORD NO.:  [id number]                   PATIENT TYPE:  IPS   LOCATION:  0507                                 FACILITY:  [facility]   PHYSICIAN:  Jeanice Lim, MD                DATE OF BIRTH:  11/03/1950   DATE OF ADMISSION:  08/17/2001  DATE OF DISCHARGE:  08/23/2001                                 DISCHARGE SUMMARY   IDENTIFYING DATA:  This is a 60 year old Caucasian female voluntarily  admitted after self-inflicted suicide attempt taking nearly full overdose of  Ambien, Risperdal and Seroquel.  Intubated for five days with developing  aspiration pneumonia.  Husband died 2001/03/23 and depression had  increased since that time.   PAST PSYCHIATRIC HISTORY:  The patient is followed by Dr. Jerrye Beavers and  Rozanna Box.  Has a history of major depression and psychosis.   MEDICATIONS:  Xanax, Ambien, Seroquel and Lexapro.   ALLERGIES:  PENICILLIN and OXYCONTIN.   PHYSICAL EXAMINATION:  Essentially within normal limits.  Medically cleared  before transfer to [facility].   LABORATORY DATA:  Routine admission labs essentially within normal limits.   MENTAL STATUS EXAM:  Middle-aged female pale, neutral, fully alert,  cooperative.  Speech within normal limits.  Mood mostly euthymic.  Affect  restricted.  Thought process goal directed.  Thought content with some  minimization of seriousness of suicide attempt.  Cognitively intact.  Judgment and insight fair.   ADMISSION DIAGNOSES:   AXIS I:  Major depression, recurrent, severe.   AXIS II:  None.   AXIS III:  1. Chronic back pain.  2. Status post polypharmacy overdose.  3. Status post aspiration pneumonia.   AXIS IV:  Severe (lifestyle changes).   AXIS V:  29/55.   HOSPITAL COURSE:  The patient was admitted and underwent further monitoring.  She was ordered routine p.r.n. medications and encouraged to participate in  individual, group and milieu therapy.  The patient was continued on Lexapro,  Protonix, Flagyl, Tequin, Ambien, Lidex, incentive spirometry.  Family  sessions were held to discuss aftercare plan and treatment issues and safety  issues.  The patient denied suicidal or dangerous ideation throughout  hospitalization, appeared to have gained insight and improved judgment  following the overdose and had a positive sense of the future.   CONDITION ON DISCHARGE:  Improved.  Mood was more euthymic.  Affect  brighter.  Thought processes goal directed.  Thought content negative for  dangerous ideation or psychotic symptoms.  The patient reported motivation  to be compliant with the followup plan.   DISCHARGE MEDICATIONS:  1. Ambien 10 mg q.h.s.  2. Ultram 50 mg t.i.d.  3. Lexapro 10 mg q. a.m.  4. Protonix 40 mg q. a.m.  5. Flagyl 500 mg q.i.d.  6. Tequin 400 mg at 4 p.m. for one day.   FOLLOWUP:  [facility]ensive [facility]patient Program on August 24, 2001 at 9 a.m.   DISCHARGE DIAGNOSES:   AXIS I:  Major depression, recurrent, severe.   AXIS II:  None.   AXIS III:  1. Chronic back pain.  2. Status post polypharmacy overdose.  3. Status post aspiration pneumonia.   AXIS IV:  Severe (lifestyle changes).   AXIS V:  Global Assessment of Functioning on discharge 50.                                               Jeanice Lim, MD    JEM/MEDQ  D:  09/22/2001  T:  09/22/2001  Job:  [id number]

## 2010-06-06 NOTE — Op Note (Signed)
NAME:  Sarah [name], Sarah [name]                          ACCOUNT NO.:  [id number]   MEDICAL RECORD NO.:  [id number]                   PATIENT TYPE:  INP   LOCATION:  3014                                 FACILITY:  [facility]   PHYSICIAN:  [name], M.D.               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  Status post  L4-L5 diskitis.   PROCEDURES:  Posterolateral laminotomy, posterior interbody arthrodesis with  TLIF bone spacer and Vitoss bone expander plus local autograft L4-L5 and L5-  S1 segmental pedicle fixation with Sexton minimally-invasive pedicle screw  construct L4, L5, and sacrum.   SURGEON:  [name], M.D.   ASSISTANT:  Mena Goes. Franky Macho, M.D.   ANESTHESIA:  General endotracheal.   INDICATIONS:  The patient is a 60 year old individual, who has had  significant back pain and bilateral leg pain more significantly on the left  side.  She had slipped and fell initially in June 2000.  The patient  complained of significant back pain and was treated conservatively for a  long period of time.  She was requiring increasing doses of pain medication,  including OxyContin and ultimately underwent an IDET procedure in May 2001  at L4-L5.  The patient was noted to have significant degeneration of the  endplates at L5 and S1.  She had had continued problems with chronic back  pain and had a severe diskitis, which ultimately was treated with  antibiotics.  She continued to have persistent back and leg pain.  The  patient had become depressed and despondent because of chronic back pain, in  addition to the recent passing of her husband secondary to cancer.  She  became so depressed that she attempted suicide.  She is  also taking high  doses of OxyContin.  After this episode, she was weaned off of all narcotic  medication and at this time although the patient has chronic back pain and  leg pain, she did undergo a diskogram, which demonstrated concordant  reproduction of her symptoms at L4-5 and L5-S1.  L3-4 was used as a control  and this was negative.  The patient was advised regarding surgical fusion by  minimally-invasive technique using __________ to do a posterior interbody  arthrodesis and then a pedicle fixation.  She is now admitted for that  procedure.   PROCEDURE:  The patient was brought to the operating room supine on the  stretcher.  After smooth induction of general endotracheal anesthesia, she  was turned prone, the back was shaved, prepped with Duraprep, and draped in  a sterile fashion.  Using fluoroscopic illumination, the entry sites of the  pedicles at  L4, L5 and the sacrum were marked off.  Then, a paramedian  incision on the left side was made to place a Sexton evenly divided between  L4-5 and L5-S1.  Pedicle probes were then passed over a K-wire to the L4-5  interspace and the area was dilated to a 22 mm dilator on the left side then  a laminotomy was created at L4-5 and taken out to the lateral aspect of the  facet.  The facet was taken down at L4-5.  The common dural tube was  retracted medially.  Underneath this was noted to be a significant bulge of  the disk at L4-5.  This was opened with a 15-blade then using a combination  of curets and rongeurs, the disk space was evacuated of a significant  quantity of markedly degenerated disk material.  Through this aperture, the  diskectomy was performed.  However, to control venous bleeding,  microdissection with the use of the operating microscope was performed so as  to dissect the epidural veins and resect them from the common dural tube and  the takeoff of the L5 nerve root and the L4 nerve root superiorly.  The bulk  of the  diskectomy itself however was done with loupe magnification.  Once  this was achieved, the endplates were curetted smooth and clear.  Once all  of the bony material was cleared from the endplates, a TLIF spacer was  placed in the interspace.  This was fitted with the 11 mm TLIF spacer at the  L4-L5 space.  Then, behind this, Vitoss bone expander was mixed with some of  the patient's local autograft and this was packed into the interspace.  Once  this was sufficiently packed, hemostasis was achieved and attention was  turned to L5-S1.  A laminotomy was created here and dissection was carried  out to the lateral recess.  The L5 nerve root was decompressed as was the S1  nerve root.  The disk space here was entered and it was noted to be severely  compressed and rather sclerotic.  The disk was evacuated in a similar  fashion using a combination of curets and rongeurs to evacuate the bulk of  the disk at L5-S1.  In the end, curettage of the endplates was performed so  as to remove any remnants of cartilaginous endplates.  This space was then  similarly sized and it was found to be fitted with a 9 mm TLIF spacer.  This  was placed into position and tamped ventrally.  Beyond this, again, a  combination of autograft and Vitoss was impacted into the interspaces.  Once  this was accomplished, attention was then turned to choosing pedicle entry  sites fluoroscopically at L4 and the sacrum first on the left side.  The  sacral hole was a little difficult to find at first secondary to the  performance of a laminotomy, however, with some dissection and replacement  of the _________ cannula to identify an entry site, the entry site was found  and a probe was passed through the pedicle of the sacrum into the vertebral  body.  At L4, 45mm x 6.5 mm screws were tapped into position after being carefully located in the center of the pedicle with a K-wire.  At the  sacrum, a 40 mm screw was placed.  At L5 in  between a 40 mm screw was placed  after drilling and tapping using the Sexton system to identify the best  positioning of the screw  itself.  Once the screws were tapped, the Henderson Newcomer  was used to pass a 70 mm precurved rod between L4, L5 and the sacrum.  The  screws were then anchored in slight compression.  The rod was released, the  Henderson Newcomer was removed, and attention was turned to the right side, where a  similar procedure was carried out.  Again, locating the pedicle in the  sacrum on that right side was somewhat difficult, however, once this  adequately achieved and the radiographic landmarks were all in line the same  series of screws were placed on the right side in slight compression.  The  rods were released, the screw caps were released and the system was removed.  The patient's wounds were then irrigated copiously with antibiotic  irrigating solution and 3-0 Vicryl was used to close the subcuticular skin.  Dermabond was placed on the skin.  Blood loss was estimated 250 cc.  The  procedure lasted a little over 8 hours in totality.                                               [name], M.D.    Merla Riches  D:  10/11/2001  T:  10/12/2001  Job:  [id number]

## 2010-06-06 NOTE — H&P (Signed)
NAME:  Sarah [name], WEASE NO.:  [id number]   MEDICAL RECORD NO.:  [id number]                   PATIENT TYPE:  IPS   LOCATION:  0304                                 [facility]ACILITY[facility]  [facility]   PHYSICIAN:  Jeanice Lim, M.D.              DATE OF BIRTH:  09-Sep-1950   DATE OF ADMISSION:  06/05/2002  DATE OF DISCHARGE:  06/08/2002                         PSYCHIATRIC ADMISSION ASSESSMENT   IDENTIFYING INFORMATION:  This is a 60 year old married white female who is  a voluntary admission.   HISTORY OF PRESENT ILLNESS:  This patient with a history of depression and  previous overdose after her husband's death remarried in 04-04-2022 of this year  and they currently have a good relationship.  The patient however reports  increased depression and anxiety over the past 2 weeks as she has been  dealing more attentively with her worker's comp lawsuit over her chronic  back pain.  She was injured in 2000 and was forced to retire from work  because of the injury and feels hopeless and powerless dealing with  insurance company.  She reports suicidal ideation for the past 2-4 days and  did not realize that she was expressing suicidal thoughts until her husband  noted that she had made allusions to making out her will and not wanting to  live and wanting to say goodbye to her family.  After talking with her, she  agreed to allow him to bring her to the hospital for assessment and she was  referred for inpatient care.  The patient endorses panic in crowds for the  past 1-2 weeks, markedly worse since meeting with her attorney about the  worker's comp lawsuit.  She also reports a depressed mood, feeling anxious,  insecure and terminal insomnia, sleeping about 6 hours per night, and  awakening at 4 a.m.  She also notes some increased irritability, a strong  sense of hopelessness and worthlessness and powerlessness.   PAST PSYCHIATRIC HISTORY:  The patient sees Dr. Elna Breslow.  This is  her [facility]  admission.  She has a history of prior admissions in August of 2003 and  January of 2003.  She does have a history of prior overdose  in August of  2003.   SOCIAL HISTORY:  The patient was widowed in 2003 after caring for her  husband of many years who had cancer for the 14 months prior to his death.  She currently has a second husband and reports a supportive relationship.  They have been married since 04-Apr-2002.  The patient also has 1 daughter  and 1 grandchild and states that they are her primary deterrents to suicide.  The patient was a former Merchandiser, retail for [employer]  and has been impaired, unable to work since she suffered a back injury,  slipping and falling there in 2000.  She has no legal charges.   FAMILY  HISTORY:  The patient denies any family history of mental illness or  substance abuse.   ALCOHOL AND DRUG HISTORY:  She endorses rare use of alcohol, denies any  other substance abuse.   PAST MEDICAL HISTORY:  The patient is followed by Dr. Danielle Dess, M.D., who is  her neurosurgeon, for her chronic back pain.  Her primary care physician is  Marlan Palau, M.D.  Medical problems include chronic back pain and  hypertension.  Past medical history is remarkable for a bilateral tubal  ligation and 2 prior back surgeries.   MEDICATIONS:  Effexor XR 150 for the past 2 months, Klonopin 1 mg 3-4  tablets daily, usually 4 tablets daily, Amaryl 50 mg b.i.d. for pain.  She  has been weaning herself down consistently and now takes 1 early in the  morning and 1 before bed.  Triamterene 75 mg daily, hydrochlorothiazide 50  mg daily.  She has discussed the use of Neurontin to control her pain with  Dr. Danielle Dess but has not yet started on that.   DRUG ALLERGIES:  PENICILLIN which causes a rash and shortness of breath.   POSITIVE PHYSICAL FINDINGS:  This is a well-nourished, well-developed female  who is in no acute distress.  She is healthy in appearance,  meticulously  groomed, hair is done, nails are done.  Vital signs, temperature 98.2, pulse  67, respirations 24, blood pressure 153/97 at the time of admission.  She  weighs approximately 178 pounds and is 5 feet 6 inches tall.  Her physical  examination is noted in the progress notes.  We do find that motor movements  are smooth, motor is normal, sensory is grossly intact, facial symmetry  present, grip strength equal bilaterally, no focal neurological signs.   REVIEW OF SYSTEMS:  Remarkable for malodorous urine but no symptoms of  dysuria.  The patient denies frequent urinary tract infection and states she  does not think she has ever had one before in her life.   LABORATORY DATA:  Normal CBC.  Her metabolic panel also reveals very  slightly decreased potassium at 3.3 but otherwise within normal limits.  BUN  18, creatinine 1.1.  Thyroid panel is within normal limits.  Her urine drug  screen is positive for benzodiazepines.  Urinalysis is remarkable for cloudy  urine with a specific gravity of 1.014.  She has moderate leukocyte  esterase, many bacteria and urine WBCs of 11-20 per high powered field.   MENTAL STATUS EXAM:  This is a fully alert female who is meticulously made  up, with neat grooming, some psychomotor slowing is present but it is very  slight.  Speech is within normal limits.  Mood is definitely depressed, with  sense of hopelessness and powerlessness.  Thought process is remarkable for  suicidal ideation without any clear plan.  It is somewhat passive.  She  wishes that it would all go away and she would not have to wake up in the  morning or get up and face these problems and she sees no end to them.  Hospital evidence of homicidal ideation, no paranoia or psychosis.  Cognitively she is intact and oriented x3.   ADMISSION DIAGNOSIS:   AXIS I:  Major depressive disorder, recurrent, severe.   AXIS II:  No diagnosis.  AXIS III:  Chronic back pain, hypertension, and  urinary tract infection.   AXIS IV:  Moderate stress, with legal and worker's compensation issues.  Having a supportive husband and other family members is an  asset to her.   AXIS V:  Current 36, past year 71.   INITIAL PLAN OF CARE:  Voluntarily admit the patient to treat her suicidal  ideation and to evaluate her anxiety with a goal of alleviating her suicidal  ideation.  We are going to keep her on her Klonopin but reduce it slightly  at 1 mg t.i.d.  We will continue her Demerol as is at 50 mg b.i.d., increase  her Effexor XR to 225 mg p.o. daily, and add Neurontin 300 mg q.a.m. and  h.s. and we are increasing this to address both her anxiety and her pain.  Meanwhile, we will start her on Septra b.i.d. for urinary tract infection  and we will get a clean catch urine for culture and sensitivity.  We have  discussed the risks and benefits of the plan with the patient and she is in  agreement with the plan.   ESTIMATED LENGTH OF STAY:  5 days.     Margaret A. Stephannie Peters                   Jeanice Lim, M.D.   MAS/MEDQ  D:  06/08/2002  T:  06/08/2002  Job:  [id number]

## 2010-06-06 NOTE — Discharge Summary (Signed)
NAME:  Sarah [name], Sarah [name] NO.:  [id number]   MEDICAL RECORD NO.:  [id number]                   PATIENT TYPE:  IPS   LOCATION:  0304                                 FACILITY:  [facility]   PHYSICIAN:  Jeanice Lim, M.D.              DATE OF BIRTH:  22-Apr-1950   DATE OF ADMISSION:  06/05/2002  DATE OF DISCHARGE:  06/08/2002                                 DISCHARGE SUMMARY   IDENTIFYING DATA:  This is a 60 year old married Caucasian female  voluntarily admitted with a history of depression and previous overdose.  The patient had increased depression and anxiety over the past two weeks,  dealing more intensively with workman's compensation lawsuit over chronic  back pain, forced to retire from work.  She felt hopeless and powerless  dealing with the insurance company.  She had suicidal ideation for two to  four days.   MEDICATIONS:  1. Effexor XR 150 mg.  2. Klonopin 1 mg three to four times daily.  3. Amaryl 50 mg b.i.d.  4. Triamterene 75 mg daily.  5. Hydrochlorothiazide 50 mg daily.  6. Neurontin to control pain.  [name], M.D., had recommended this     but it had not been started yet.   DRUG ALLERGIES:  PENICILLIN.   PHYSICAL EXAMINATION:  GENERAL:  Essentially within normal limits.  NEUROLOGIC:  Nonfocal.   LABORATORY DATA:  Routine admission labs: Normal CBC.  CMET: Within normal  limits except for a low potassium of 3.3.  Thyroid panel was within normal  limits.  Urine drug screen: Positive for benzodiazepines.  Wbc's in the UA.   MENTAL STATUS EXAM:  Fully alert female, very neatly groomed with some  psychomotor slowing.  Speech: Within normal limits.  Mood: Depressed with  hopelessness and powerlessness.  Thought process: Goal directed.  Thought  content: Positive for suicidal ideation without plan, somewhat passive,  wished that she would go all the way so that she would not wake up.  Cognitive: Intact.  Judgment and insight: Fair  to poor.   ADMISSION DIAGNOSES:   AXIS I:  Major depressive disorder, recurrent, severe.   AXIS II:  Deferred.   AXIS III:  1. Chronic back pain.  2. Hypertension.  3. Urinary tract infection.   AXIS IV:  Moderate stress over legal and workman's compensation issues.   AXIS V:  [id number]   HOSPITAL COURSE:  The patient was admitted, ordered routine p.r.n.  medications, underwent further monitoring, and was encouraged to participate  in individual, group, and milieu therapy, learn healthier coping skills, and  was monitored regarding her safety.  The patient was resumed on  psychotropics.  Demerol and Ultram were used to control pain.  The patient  reported a positive response to clinical intervention, reported resolution  of suicidal thoughts.   CONDITION ON DISCHARGE:  Her condition on discharge was markedly improved.  Mood  was more euthymic.  Affect: Brighter.  Thought processes: Goal  directed.  Thought content: Negative for dangerous ideation or psychotic  symptoms.  The patient reported motivation to be compliant with the  aftercare plan.   DISCHARGE MEDICATIONS:  1. Effexor XR 75 mg three q.a.m.  2. Neurontin 300 mg b.i.d.  3. Klonopin 1 mg t.i.d.  4. Maxzide 75/50 mg in the a.m.  5. Protonix 40 mg q.a.m.  6. Naprosyn 500 mg b.i.d.  7. Septra DS twice a day.  8. Ultram 100 mg q.8h. as needed.  9. The patient was to stop Demerol as tolerated.   FOLLOW UP:  The patient was to follow up with [name], M.D., and  [name] on May 27.   DISCHARGE DIAGNOSES:   AXIS I:  Major depressive disorder, recurrent, severe.   AXIS II:  Deferred.   AXIS III:  1. Chronic back pain.  2. Hypertension.  3. Urinary tract infection.   AXIS IV:  Moderate stress over legal and workman's compensation issues.   AXIS V:  Global assessment of functioning on discharge was 55.                                               Jeanice Lim, M.D.    JEM/MEDQ  D:   07/06/2002  T:  07/06/2002  Job:  [id number]

## 2010-06-06 NOTE — Discharge Summary (Signed)
NAME:  Sarah [name], Sarah [name]                          ACCOUNT NO.:  [id number]   MEDICAL RECORD NO.:  [id number]                   PATIENT TYPE:  INP   LOCATION:  3014                                 FACILITY:  [facility]   PHYSICIAN:  [name], M.D.               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   OPERATIONS:  1. L4-5, L5-S1 posterior lumbar interbody fusion with Synthase T-lift bone     spacer and structural allograft, minimally invasive pedicle screw     fixation L4, L5 and the sacrum segmentally on 10/11/01.  2. Revision of right pedical fixation L4, right L5 and S1 on 10/17/01.   CONDITION ON DISCHARGE:  Improved.   HOSPITAL COURSE:  The patient is a 60 year old individual who has had  significant back pain with bilateral leg pain.  She had undergone extensive  conservative management and ultimately was treated with interdistal  electrothermy by another specialist at [facility].  She did poorly  after that and was found to have a diskitis at the L4-5 level.  She was  treated with antibiotics.  After treatment she had continued problems with  severe back pain and bilateral leg pain.  The patient had become very  despondent over the pain, and in fact had a suicide attempt.  Ultimately she  underwent further evaluation including diskography which was concordant at  the L4-5 and L5-S1 levels.  After careful consideration of her options she  was admitted to undergo surgical decompression and stabilization with a  minimally invasive technique from L4 to the sacrum.  This was carried out on  10/11/01.  During her postoperative period the patient noted that she had  significant  right lower extremity pain.  Followup studies included a CT scan  which demonstrated that there was cut-out of the pedicle screws on the right  side at the L5 and S1 levels.  She responded well to anti-inflammatories and  some use of steroids, however, because of the findings on the radiographs I  decided to take her back to the operating room on the 29th of September  where the right-sided screws were replaced.   Postoperatively she in fact had significant improvement in the right lumbar  radiculopathy and the patient was ambulatory the day after surgery with  significantly decreased right lower extremity pain.  Her incisions have been  clean, dry and are healing nicely and at this time the patient is discharged  home with a prescription for [facility] #100 without refills, Flexeril  10 mg #60 with p.r.n. refills as needed for muscle spasms.  Her followup  will be in  three weeks in my office.  She is walking independently though  she is sent home with the use of a walker.  Her family has been supportive  and the patient in fact feels significantly improved and it is hopeful that  her pain syndrome is indeed changing and improving all for the better.  We  will see how she does as an outpatient.                                               [name], M.D.    Merla Riches  D:  10/20/2001  T:  10/24/2001  Job:  [id number]

## 2010-06-06 NOTE — Discharge Summary (Signed)
[facility]  Patient:    Sarah [name], Sarah [name]                       MRN: [id number] Adm. Date:  [id number] Disc. Date: [id number] Attending:  Dominica Severin Dictator:   Alexzandrew L. Julien Girt, P.A.-C. CC:         Bari Edward, M.D.  [name], M.D.   Discharge Summary  ADMITTING DIAGNOSIS:  Intractable low back pain.  DISCHARGE DIAGNOSIS:  Intractable low back pain.  PROCEDURES:  No operative procedures were performed during this hospitalization.  CONSULTS:  Infectious disease - Dr. Carolyn Stare, Dr. Ninetta Lights.  Pending consult following discharge - Dr. Danielle Dess.  BRIEF HISTORY:  The patient is a 60 year old female who has been seen by several physicians concerning her low back pain with consults and evaluations done by Dr. Turner Daniels, Dr. Gerlene Fee, Dr. Neomia Dear, Dr. Murray Hodgkins, and recently Dr. Ethelene Hal.  The patient has been seen by Dr. Ethelene Hal with an injection approximately three weeks ago without relief concerning her intractable low back pain.  She also has been undergoing physical therapy which she believes has reexacerbated her pain.  She is seen and evaluated in the [facility] emergency department for the above-stated condition.  Pain has been increasing and unrelenting over the past two weeks.  Pain is located into the lower lumbar spine.  Upon evaluation in the emergency room, it was determined that she has evaluation currently pending with Dr. Danielle Dess, however, she was unable to carry on until the time of her visit.  Therefore, she was seen and evaluated in the emergency department.  Due to the fact that she was originally seen by Dr. Ethelene Hal, [facility] was consulted.  Dr. Cheree Ditto was on call.  Patient was seen and evaluated and due to the level of her pain, she was admitted for pain control and evaluation of workup.  It would be decided, if needed, we would have Dr. Danielle Dess evaluate the patient in-house or have her follow back up following the  in-house evaluation.  The patient was subsequently admitted to [facility].  LABORATORY DATA:  CBC on admission showed a hemoglobin of 12.2, hematocrit of 33.1.  She had a normal white count of 9.2, a red cell count of 3.93. Differential showed slight elevation of her neutrophils of 78, remaining differential within normal limits.  Sed rate was elevated on admission at 40. PT and PTT on admission were 12.5 and 28, respectively, with an INR of 1.0. Chem panel is slightly low.  Potassium of 3.3, slightly elevated glucose of 128, slightly decreased albumin of 3.4.  She had an elevated ALT level of 61.  Remaining chem panel all within normal limits. Her C-reactive protein was normal at 0.7.  Urinalysis on November 25, 1999 showed trace ketones, small leukocyte esterase with 6-10 white cells and many bacteria. Urine culture was taken which proved to be positive for E. coli with pansensitivities noted in chart.  Blood cultures x 2 taken November 28, 1999, both negative for growth at five days.  X-RAYS AND DIAGNOSTIC STUDIES:  Lumbar spine films taken on admission of November 24, 1999 showed no acute abnormalities with some mild degenerative disk disease noted at L4-5.  SI joints were normal.  Slight loss of disk space at L4-5, some degenerative changes in the facet joints at L4-5 and L5-S1.  MRI of the lumbar spine taken on November 25, 1999 - I do not have the transcribed report.  This  is taken from Dr. Brunetta Genera notes.  Mild spinal stenosis at L4-5 secondary to a broad-based shallow disk herniation with some flattening of the anterior thecal sac.  Also some right foraminal disk protrusion but no mass effect or cord compression.  Again noted, some mild disk degeneration.  Apparently an MRI of the pelvis was ordered also on November 25, 1999 and was unable to be performed.  On the following day, the patient was taken for the MRI of the pelvis done on November 26, 1999.  Unremarkable MR of  the pelvis.  Bony pelvis and hips are normal.  No significant intrapelvic process.  Endometrial stripe is thickened but this could be due to secretory phase of ovulation.  Please note that the patient is not postmenopausal and appeared to be a normal finding.  There was also noted an abnormal signal intensity noted in the L5 and S1 vertebral body from the left-hand side.  Recommend correlation with recent MRI of the lumbar spine.  Recommended follow-up MR with contrast.  On the following day, the patient was taken back for CT of the abdomen and pelvis with contrast.  CT of the abdomen and pelvis taken on November 27, 1999 shows post cholecystectomy changes noted.  A 1 cm nonspecific mass in the left adrenal region which may represent an adenoma; however, recommend follow-up limited CT scan in three months without IV or oral contrast.  No evidence of abdominal adenopathy or fluid collections.  Within the pelvis, there was no evidence of pelvic masses, adenopathy, or fluid collections.  Bone windows for the entire study were removed and no acute abnormalities were noted.  Negative pelvis CT.  Patient did undergo a follow-up MRI of the lumbar spine.  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  There may be associated discitis but no enhancement of the disks was noted.  The vertebral end plates appeared intact.  Findings were called to Dr. Amanda Pea.  HOSPITAL COURSE:  Patient is subsequently admitted following evaluation in the ER on November 24, 1999.  She was placed at bed rest, analgesic pain medications were ordered.  Workup was initiated as per Dr. Amanda Pea. Diagnostic tests were ordered throughout this hospitalization.  Please refer to the laboratory data, and x-ray data and diagnostic study dictations as above.  Patient was found to have a high level of pain, therefore, she was  admitted, placed  at bed rest.  The initial MRI was ordered which was performed on November 6.  This did show mild shallow disk herniation with also degenerative disk disease.  Otherwise no obvious abnormalities were seen.  She was unable to perform the MRI of the pelvis due to pain levels at that time. She was transferred back from the MRI unit over to the hospital, again placed at bedrest.  These findings were discussed with her.  It was felt that we would need the MRI of the pelvis, therefore, she was transferred back to [facility] on the following day.  She did undergo the MRI of the pelvis. The uterine stripe was noted which was felt to be normal for a premenopausal patient.  During this exam, there was an abnormal signal intensity noted at the L5-S1.  Follow up MRI was recommended.  She was initially started on PO and IM analgesic for pain control following surgery.  These meditations had to be adjusted early in the hospital course due to multiple transfers to the  MRI and stretcher.  However, throughout the hospital course, after bed rest, her pain level did improve and she was later weaned over to p.o. analgesics. Following the MRI of the pelvis, patient had follow up studies of the abdomen and pelvis via CT with contrast.  These results are as above. Again, it was recommended by the radiologist that the patient undergo repeat MRI of the lumbar with contrast due to the significant findings as stated earlier in the level of L5-S1.  It was felt there was a chance or some findings consistent with possible osteomyelitis.  This was discussed at length with the patient by Dr. Amanda Pea at the bedside.  During the hospital course, due to the findings found on admission urine with the bacteria, a urine culture was sent.  Urine culture proved to be positive for E. coli.  She was started on Cipro by mouth. Blood cultures were taken to rule out uroseptocemia.  Blood cultures were found to be negative.  The  follow-up lumbar MRI did have findings consistent with possible osteomyelitis which was felt to be the possible cause of the intractable back pain.  After extensive questioning and discussions with the patient, it was found that she had an IDET procedure months prior to admission.  These findings were discussed at length with several radiologists. They felt there was a possibility that this could be some changes associated with her previous procedure, however, due to the level of her pain, it was concerning that this could also possibly be possible infection.  There was some consideration with the small fluid collection that was noted anterior to the vertebral bodies that the patient undergo a CT-guided aspiration.  An infectious disease consult was called in.  Patient was initially seen by Dr. Roxan Hockey on November 29, 1999.  It was felt that, by infectious disease, that the patient possibly may not have a deep osteomyelitis but it was felt the patient would benefit from continued antibiotics for several weeks.  It is recommended by infectious disease to have a repeat MRI in two weeks.  The aspiration had not been done at that time.  In open surgery, however, definitive would certainly not help her back pain.  Therefore, it was recommended for antibiotics and follow-up study.  Due to this lengthy hospitalization and multiple testing, the patient had heard several things thorughout this hospitalization that varied in opinion from disk herniation ro possible infection to possible no infection.  Dr. Amanda Pea took much care in talking to the patient and her husband about the different findings, cross coverage care from ID also included Dr. Ninetta Lights.  Dr. Ninetta Lights evaluated the patient also and changed the patients Cipro antibiotic over to Tequin due to the fact the Cipro was being rationed nationally.  He also agreed with continued antibiotics with repeat imaging and studies in two weeks. After  much discussion by Dr. Amanda Pea and the fact that the patient had an outpatient visit already scheduled with Dr. Danielle Dess, Dr. Amanda Pea talked with Dr. Danielle Dess personally about the patient and her case and it was decided that since the patient had improved on her pain control and had been able to start ambulating with a walker assistant, we would allow the patient to be discharged home on p.o. antibiotics, schedule her for follow-up diagnostic study, and have her follow up with Dr. Danielle Dess for further care and evaluation of her current condition.  She and her husband were in agreement with this and it was decided that the patient be  later discharged from the hospital on December 01, 1999.  DISCHARGE PLAN:  Patient was discharged home on December 01, 1999.  DISCHARGE DIAGNOSIS:  Same as above.  DISCHARGE MEDICATIONS:  1.  Percocet p.r.n. pain.  2.  Tequin 400 mg daily for two weeks.  3.  Robaxin p.r.n. spasm.  4.  Colace.  DIET:  As tolerated.  ACTIVITY:  She is placed on bed rest with only limited activity and bathroom privileges.  She is also placed on back precautions.  FOLLOW-UP:  She is going to follow up with Dr. Danielle Dess on Wednesday following discharge and to follow-up with Dr. Danielle Dess for further subsequent care concerning her condition.  DISPOSITION:  She is discharged home with her husband.  CONDITION UPON DISCHARGE:  Stable.  FINAL DIAGNOSIS:  Intractable back pain, etiology unknown.  Currently undergoing further workup and followup Dr. Danielle Dess. DD:  01/01/00 TD:  01/02/00 Job: [id number]

## 2010-08-23 ENCOUNTER — Inpatient Hospital Stay (HOSPITAL_COMMUNITY)
Admission: EM | Admit: 2010-08-23 | Discharge: 2010-08-26 | DRG: 917 | Disposition: A | Discharge status: Other Acute Inpatient Hospital | Payer: Medicare Other | Attending: Pulmonary Disease | Admitting: Pulmonary Disease

## 2010-08-23 ENCOUNTER — Emergency Department (HOSPITAL_COMMUNITY): Payer: Medicare Other

## 2010-08-23 DIAGNOSIS — F332 Major depressive disorder, recurrent severe without psychotic features: Secondary | ICD-10-CM | POA: Diagnosis present

## 2010-08-23 DIAGNOSIS — T424X4A Poisoning by benzodiazepines, undetermined, initial encounter: Secondary | ICD-10-CM

## 2010-08-23 DIAGNOSIS — E876 Hypokalemia: Secondary | ICD-10-CM | POA: Diagnosis not present

## 2010-08-23 DIAGNOSIS — T4271XA Poisoning by unspecified antiepileptic and sedative-hypnotic drugs, accidental (unintentional), initial encounter: Secondary | ICD-10-CM | POA: Diagnosis present

## 2010-08-23 DIAGNOSIS — K922 Gastrointestinal hemorrhage, unspecified: Secondary | ICD-10-CM | POA: Diagnosis present

## 2010-08-23 DIAGNOSIS — J9600 Acute respiratory failure, unspecified whether with hypoxia or hypercapnia: Secondary | ICD-10-CM | POA: Diagnosis present

## 2010-08-23 DIAGNOSIS — M549 Dorsalgia, unspecified: Secondary | ICD-10-CM | POA: Diagnosis present

## 2010-08-23 DIAGNOSIS — T43591A Poisoning by other antipsychotics and neuroleptics, accidental (unintentional), initial encounter: Secondary | ICD-10-CM

## 2010-08-23 DIAGNOSIS — J690 Pneumonitis due to inhalation of food and vomit: Secondary | ICD-10-CM | POA: Diagnosis present

## 2010-08-23 DIAGNOSIS — T503X4A Poisoning by electrolytic, caloric and water-balance agents, undetermined, initial encounter: Secondary | ICD-10-CM | POA: Diagnosis present

## 2010-08-23 DIAGNOSIS — T426X1A Poisoning by other antiepileptic and sedative-hypnotic drugs, accidental (unintentional), initial encounter: Secondary | ICD-10-CM | POA: Diagnosis present

## 2010-08-23 DIAGNOSIS — G4700 Insomnia, unspecified: Secondary | ICD-10-CM | POA: Diagnosis present

## 2010-08-23 DIAGNOSIS — I10 Essential (primary) hypertension: Secondary | ICD-10-CM | POA: Diagnosis present

## 2010-08-23 DIAGNOSIS — Z880 Allergy status to penicillin: Secondary | ICD-10-CM

## 2010-08-23 DIAGNOSIS — I959 Hypotension, unspecified: Secondary | ICD-10-CM | POA: Diagnosis present

## 2010-08-23 DIAGNOSIS — Z79899 Other long term (current) drug therapy: Secondary | ICD-10-CM

## 2010-08-23 DIAGNOSIS — T426X2A Poisoning by other antiepileptic and sedative-hypnotic drugs, intentional self-harm, initial encounter: Secondary | ICD-10-CM | POA: Diagnosis present

## 2010-08-23 DIAGNOSIS — G8929 Other chronic pain: Secondary | ICD-10-CM | POA: Diagnosis present

## 2010-08-23 DIAGNOSIS — E785 Hyperlipidemia, unspecified: Secondary | ICD-10-CM | POA: Diagnosis present

## 2010-08-23 DIAGNOSIS — Y92009 Unspecified place in unspecified non-institutional (private) residence as the place of occurrence of the external cause: Secondary | ICD-10-CM

## 2010-08-23 DIAGNOSIS — T4272XA Poisoning by unspecified antiepileptic and sedative-hypnotic drugs, intentional self-harm, initial encounter: Secondary | ICD-10-CM | POA: Diagnosis present

## 2010-08-23 DIAGNOSIS — T43502A Poisoning by unspecified antipsychotics and neuroleptics, intentional self-harm, initial encounter: Secondary | ICD-10-CM | POA: Diagnosis present

## 2010-08-23 DIAGNOSIS — T43294A Poisoning by other antidepressants, undetermined, initial encounter: Secondary | ICD-10-CM | POA: Diagnosis present

## 2010-08-23 DIAGNOSIS — T50992A Poisoning by other drugs, medicaments and biological substances, intentional self-harm, initial encounter: Secondary | ICD-10-CM | POA: Diagnosis present

## 2010-08-23 LAB — URINALYSIS, ROUTINE W REFLEX MICROSCOPIC
Bilirubin Urine: NEGATIVE
Hgb urine dipstick: NEGATIVE
Specific Gravity, Urine: 1.014 (ref 1.005–1.030)
Urobilinogen, UA: 0.2 mg/dL (ref 0.0–1.0)
pH: 5 (ref 5.0–8.0)

## 2010-08-23 LAB — POCT I-STAT, CHEM 8
BUN: 40 mg/dL — ABNORMAL HIGH (ref 6–23)
Chloride: 100 mEq/L (ref 96–112)
Creatinine, Ser: 1.7 mg/dL — ABNORMAL HIGH (ref 0.50–1.10)
Glucose, Bld: 119 mg/dL — ABNORMAL HIGH (ref 70–99)
HCT: 42 % (ref 36.0–46.0)
Potassium: 3.9 mEq/L (ref 3.5–5.1)

## 2010-08-23 LAB — BASIC METABOLIC PANEL
BUN: 41 mg/dL — ABNORMAL HIGH (ref 6–23)
CO2: 25 mEq/L (ref 19–32)
Calcium: 9.4 mg/dL (ref 8.4–10.5)
Chloride: 93 mEq/L — ABNORMAL LOW (ref 96–112)
Creatinine, Ser: 1.74 mg/dL — ABNORMAL HIGH (ref 0.50–1.10)
Glucose, Bld: 134 mg/dL — ABNORMAL HIGH (ref 70–99)

## 2010-08-23 LAB — HEPATIC FUNCTION PANEL
Alkaline Phosphatase: 104 U/L (ref 39–117)
Indirect Bilirubin: 0.7 mg/dL (ref 0.3–0.9)
Total Bilirubin: 0.8 mg/dL (ref 0.3–1.2)

## 2010-08-23 LAB — BLOOD GAS, ARTERIAL
Acid-Base Excess: 1.4 mmol/L (ref 0.0–2.0)
Drawn by: 331471
MECHVT: 500 mL
RATE: 12 resp/min
pCO2 arterial: 41.8 mmHg (ref 35.0–45.0)
pH, Arterial: 7.406 — ABNORMAL HIGH (ref 7.350–7.400)

## 2010-08-23 LAB — ETHANOL: Alcohol, Ethyl (B): <11 mg/dL (ref 0–11)

## 2010-08-23 LAB — CBC
HCT: 43 % (ref 36.0–46.0)
MCV: 87 fL (ref 78.0–100.0)
RDW: 13.3 % (ref 11.5–15.5)
WBC: 17.4 10*3/uL — ABNORMAL HIGH (ref 4.0–10.5)

## 2010-08-23 LAB — RAPID URINE DRUG SCREEN, HOSP PERFORMED
Amphetamines: NOT DETECTED
Cocaine: NOT DETECTED
Opiates: NOT DETECTED
Tetrahydrocannabinol: NOT DETECTED

## 2010-08-23 LAB — DIFFERENTIAL
Eosinophils Relative: 1 % (ref 0–5)
Lymphocytes Relative: 7 % — ABNORMAL LOW (ref 12–46)
Lymphs Abs: 1.3 10*3/uL (ref 0.7–4.0)

## 2010-08-24 ENCOUNTER — Inpatient Hospital Stay (HOSPITAL_COMMUNITY): Payer: Medicare Other

## 2010-08-24 DIAGNOSIS — J9600 Acute respiratory failure, unspecified whether with hypoxia or hypercapnia: Secondary | ICD-10-CM

## 2010-08-24 DIAGNOSIS — T43591A Poisoning by other antipsychotics and neuroleptics, accidental (unintentional), initial encounter: Secondary | ICD-10-CM

## 2010-08-24 DIAGNOSIS — F331 Major depressive disorder, recurrent, moderate: Secondary | ICD-10-CM

## 2010-08-24 DIAGNOSIS — T424X4A Poisoning by benzodiazepines, undetermined, initial encounter: Secondary | ICD-10-CM

## 2010-08-24 LAB — GLUCOSE, CAPILLARY
Glucose-Capillary: 87 mg/dL (ref 70–99)
Glucose-Capillary: 97 mg/dL (ref 70–99)
Glucose-Capillary: 106 mg/dL — ABNORMAL HIGH (ref 70–99)

## 2010-08-24 LAB — CBC
HCT: 38.7 % (ref 36.0–46.0)
Hemoglobin: 12.9 g/dL (ref 12.0–15.0)
MCV: 88.8 fL (ref 78.0–100.0)
RBC: 4.36 MIL/uL (ref 3.87–5.11)
WBC: 13.4 10*3/uL — ABNORMAL HIGH (ref 4.0–10.5)

## 2010-08-24 LAB — BASIC METABOLIC PANEL
BUN: 36 mg/dL — ABNORMAL HIGH (ref 6–23)
CO2: 27 mEq/L (ref 19–32)
CO2: 26 mEq/L (ref 19–32)
Calcium: 8.1 mg/dL — ABNORMAL LOW (ref 8.4–10.5)
Chloride: 107 mEq/L (ref 96–112)
Creatinine, Ser: 1.3 mg/dL — ABNORMAL HIGH (ref 0.50–1.10)
GFR calc non Af Amer: 48 mL/min — ABNORMAL LOW (ref >60)
Glucose, Bld: 94 mg/dL (ref 70–99)
Potassium: 3.6 mEq/L (ref 3.5–5.1)
Potassium: 3.4 mEq/L — ABNORMAL LOW (ref 3.5–5.1)
Sodium: 144 mEq/L (ref 135–145)

## 2010-08-25 DIAGNOSIS — F331 Major depressive disorder, recurrent, moderate: Secondary | ICD-10-CM

## 2010-08-25 LAB — BASIC METABOLIC PANEL
Chloride: 109 mEq/L (ref 96–112)
GFR calc Af Amer: >60 mL/min (ref >60)
GFR calc non Af Amer: >60 mL/min (ref >60)
Potassium: 3.2 mEq/L — ABNORMAL LOW (ref 3.5–5.1)
Sodium: 143 mEq/L (ref 135–145)

## 2010-08-25 LAB — GLUCOSE, CAPILLARY
Glucose-Capillary: 115 mg/dL — ABNORMAL HIGH (ref 70–99)
Glucose-Capillary: 89 mg/dL (ref 70–99)
Glucose-Capillary: 130 mg/dL — ABNORMAL HIGH (ref 70–99)
Glucose-Capillary: 104 mg/dL — ABNORMAL HIGH (ref 70–99)
Glucose-Capillary: 116 mg/dL — ABNORMAL HIGH (ref 70–99)

## 2010-08-25 LAB — CBC
HCT: 32.8 % — ABNORMAL LOW (ref 36.0–46.0)
MCHC: 32.9 g/dL (ref 30.0–36.0)
RDW: 13.4 % (ref 11.5–15.5)
WBC: 9.6 10*3/uL (ref 4.0–10.5)

## 2010-08-26 DIAGNOSIS — J9600 Acute respiratory failure, unspecified whether with hypoxia or hypercapnia: Secondary | ICD-10-CM

## 2010-08-26 DIAGNOSIS — T424X4A Poisoning by benzodiazepines, undetermined, initial encounter: Secondary | ICD-10-CM

## 2010-08-26 DIAGNOSIS — T43591A Poisoning by other antipsychotics and neuroleptics, accidental (unintentional), initial encounter: Secondary | ICD-10-CM

## 2010-08-26 LAB — BASIC METABOLIC PANEL
BUN: 13 mg/dL (ref 6–23)
GFR calc Af Amer: >60 mL/min (ref >60)
GFR calc non Af Amer: >60 mL/min (ref >60)
Potassium: 3.3 mEq/L — ABNORMAL LOW (ref 3.5–5.1)

## 2010-08-26 LAB — GLUCOSE, CAPILLARY: Glucose-Capillary: 136 mg/dL — ABNORMAL HIGH (ref 70–99)

## 2010-08-28 NOTE — Consult Note (Signed)
  NAMEORA, Sarah [name] NO.:  [id number]  MEDICAL RECORD NO.:  [id number]  LOCATION:  1224                         FACILITY:  [facility]  PHYSICIAN:  Eulogio Ditch, MD DATE OF BIRTH:  1950/04/01  DATE OF CONSULTATION:  08/24/2010 DATE OF DISCHARGE:                                CONSULTATION   REASON FOR CONSULTATION:  Drug overdose.  HISTORY OF PRESENT ILLNESS:  60 year old female who was admitted in the ICU after she overdosed on Effexor and Ativan.  The patient was brought to the hospital by the daughter as she was found on the floor.  The patient has a long history of major depressive disorder, recurrent type. The patient told me that she was feeling lonely.  She is living alone for the last 10 years after the death of her husband because of the cancer.  The patient's family told me that the mother was telling them that once discharged from the hospital she is going to do that again as she really wanted to kill herself.  The patient do not want to be admitted to behavioral health for a short-term.  Family is very concerned about the patient and they want long-term treatment for the patient.  They told me the medications for depression and the counseling outside is not helping her.  PAST MEDICAL HISTORY: 1. Hypertension. 2. Dyslipidemia. 3. Chronic back pain.  ALLERGIES:  The patient is allergic to: 1. PENICILLIN. 2. OXYCODONE. 3. CLINDAMYCIN. 4. LATEX.  SUBSTANCE ABUSE HISTORY:  The patient denies abusing any illicit drugs or alcohol.  MENTAL STATUS EXAMINATION:  The patient is calm, cooperative during the interview, but is not forthcoming in providing the history.  Her speech is soft, slow.  Mood depressed.  The patient was crying during the interview.  Was redirectable during the interview.  No psychotic or manic symptoms noticed.  The patient is alert, awake, oriented x3. Memory immediate, recent remote fair.  Attention and concentration  poor. Abstraction ability fair.  Insight and judgment poor.  DIAGNOSES:  Axis I:  Major depressive disorder, recurrent type. Axis II:  Deferred. Axis III:  See medical notes. Axis IV:  Long history of depression, living alone. Axis V:  30.  RECOMMENDATIONS:  I discussed with the family and they want the patient to be admitted for long-term facility.  [facility] the patient [facility].  I will follow up on this patient in the morning.     Eulogio Ditch, MD     SA/MEDQ  D:  08/24/2010  T:  08/24/2010  Job:  [id number]  Electronically Signed by Eulogio Ditch  on 08/28/2010 08:55:55 AM

## 2010-08-30 LAB — CULTURE, BLOOD (ROUTINE X 2)
Culture  Setup Time: 201208050051
Culture: NO GROWTH

## 2010-08-31 NOTE — Discharge Summary (Signed)
  NAMESHAOLIN, ARMAS NO.:  [id number]  MEDICAL RECORD NO.:  [id number]  LOCATION:  1224                         FACILITY:  [facility]  PHYSICIAN:  Oretha Milch, MD      DATE OF BIRTH:  12-Jul-1950  DATE OF ADMISSION:  08/23/2010 DATE OF DISCHARGE:                              DISCHARGE SUMMARY   DISCHARGE DIAGNOSES: 1. Acute respiratory failure, suspected aspiration. 2. Hypertension. 3. Benzodiazepine overdose. 4. Change in mental status and presence of depression, severe. 5. Hypocalcemia.  HISTORY OF PRESENT ILLNESS:  Ms. Littles is a 60 year old white female with known depression, hypertension, who had a suicidal overdose with lorazepam, Effexor XR and possibly other medications.  She is intubated for airway protection and questionable lingual aspiration and pneumonia. She was admitted to the ICU, [facility] for further evaluation and treatment.  Lines and endotracheal tube from August 4, to August 5, and arterial line from August 5 to August 6.  Antibiotics consist of Avelox from August 4 to August 6.  No culture data with positive evidence.  On August 4, she was hypertensive, responded to fluids and no further incidents.  LABORATORY DATA:  Hemoglobin 10.8, hematocrit 32.8, platelets were 208, and WBCs 9.6.  Sodium 143, potassium 3.3, chloride 107, CO2 is 28, BUN is 13, creatinine 0.85, glucose 114, albumin is 3.4, AST is 18, ALT is 17, alkaline phosphatase is 104, total bilirubin 0.8, and calcium 8.0. No positive blood cultures.  RADIOGRAPHIC DATA:  Chest x-ray on August 5, demonstrates increasing atelectasis on the left base.  HOSPITAL COURSE/DISCHARGE DIAGNOSES: 1. Acute respiratory failure secondary to overdose.  She was     successfully provided mechanical ventilatory support within 24     hours.  There was a suspected aspiration and antibiotics have been     discontinued.  She has no further indication of being infected. 2. Hypertension.   This was transient, resolved with IV fluids. 3. Benzodiazepine overdose. 4. Effexor overdose and questionable other medications overdose.  She     had a psychiatric evaluation per Psychiatry.  She will be     transported to a long-term inpatient psychiatric institution at the     time of discharge. 5. Depression.  This is improved. 6. Hypoxemia and hypokalemia.  These both have been repleted.  She     awaits transfer to inpatient psychiatric unit.  DISCHARGE MEDICATIONS:  She is on no medications at this time.  DIET:  Heart-healthy diet.  DISPOSITION AT THE TIME OF TRANSFER:  Ventilator-dependent respiratory failure has resolved.  Her suicide ideation has been treated with further treatment in an inpatient psychiatric facility.  She is being transferred in improved condition.     Devra Dopp, MSN, ACNP   ______________________________ Oretha Milch, MD    SM/MEDQ  D:  08/26/2010  T:  08/26/2010  Job:  [id number]  Electronically Signed by Devra Dopp MSN ACNP on 08/28/2010 03:01:59 PM Electronically Signed by Cyril Mourning MD on 08/31/2010 09:33:15 PM

## 2010-08-31 NOTE — H&P (Signed)
NAMEKAESHA, KIRSCH NO.:  [id number]  MEDICAL RECORD NO.:  [id number]  LOCATION:  WLED                         FACILITY:  [facility]  PHYSICIAN:  Oretha Milch, MD      DATE OF BIRTH:  21-Oct-1950  DATE OF ADMISSION:  08/23/2010 DATE OF DISCHARGE:                             HISTORY & PHYSICAL   PRIMARY CARE PHYSICIAN:  Luanna Cole. Lenord Fellers, MD  PSYCHIATRIST:  ? Andee Poles, MD  REASON FOR ADMISSION:  Acute respiratory failure, benzodiazepine overdose.  HISTORY OF PRESENT ILLNESS:  Ms. Faro is currently intubated and the history is obtained after discussing with the daughter who only has limited knowledge of her medical history, reviewing her medical record, and speaking to the emergency room attending and nurse.  She was last seen normal by her daughter on Thursday.  The daughter was called by other family members, including her grandmother, about 2 hours prior to admission that her mother was acting strange on the phone.  She went over to her house and found her on the floor of her bedroom.  There was some coffee-ground emesis and blood around her nose and mouth.  She admitted to taking a handful of her Effexor pills and said that she wanted to die.  She apparently also admitted this to EMS and to the trooper who responded.  On arrival to the emergency room at 17:07, temperature was 97.4, blood pressure was 74/49, heart rate 71, respirations 14.  She was lethargic and started dropping her oxygen saturation, at which point she was intubated using etomidate and rocuronium.  Naloxone 2 mg IV was administered prior to this.  For the low blood pressure, 2 L of IV fluids of normal saline was administered. Her urine output in the emergency room so far was 1.2 L.  Blood pressure seems to have improved and stayed in the low 100s for the last 30 minutes or more.  After review of her medication list, it is postulated that she may have consumed lorazepam, Zolpidem,  Effexor, and also possibly Zocor and furosemide.  Please note the medications listed below.  She has history of a similar overdose in April 2011 and admission and psychiatric evaluation noted in the E-chart.  PAST MEDICAL HISTORY:  Includes major depression, hypertension, dyslipidemia, insomnia, and chronic back pain.  ALLERGIES:  Include PENICILLIN, OXYCODONE, CLINDAMYCIN, and LATEX.  PAST SURGICAL HISTORY:  Back surgery in 2003, laparoscopic-assisted vaginal hysterectomy and bilateral salpingo-oophorectomy in January 2005.  SOCIAL HISTORY:  She lives alone.  There is no history of tobacco, alcohol or illicit drug use.  FAMILY HISTORY:  Mother had diabetes and coronary artery disease. Father died of cancer of unknown kind.  REVIEW OF SYSTEMS:  Unobtainable at the current time.  MEDICATION LIST:  As obtained by reviewing the empty pill bottles that the daughter has brought with her: 1. Lorazepam 1 mg t.i.d. p.r.n. x 90 pills, filled on 07/20. 2. Zolpidem 10 mg x 30 pills, filled on 07/20. 3. Potassium chloride CR 20 mEq 2 tablets b.i.d. x 60, filled on     07/10/09. 4. Furosemide 40 b.i.d. x 60, filled on 08/08/10. 5. Gabapentin 300 mg t.i.d. x  90 pills, filled on 02/14/09. 6. Furosemide 40 b.i.d. x 60 pills, filled on 08/08/10. 7. Benzonatate 100 mg daily x 60 pills, filled on 12/31/09. 8. Effexor XR 150 mg x 30 pills, filled on 08/08/10. 9. Losartan 50 mg daily x 30 pills, filled on 02/10/10. 10.There is an unknown quantity of vitamin E and another pill bottle     that the daughter does not know about.  PHYSICAL EXAM:  GENERAL:  Adult woman, well-nourished, orally intubated, appears sedated in no apparent respiratory distress. VITAL SIGNS:  Heart rate 78 per minute, sinus on monitor; blood pressure 118/72; respirations 14 per minute.  Ventilator settings PRVC 500, respiratory rate of 12%, 100% PEEP of 5. HEENT:  Pupils 3 mm reactive to light. NECK:  Supple.  No JVD.  No  lymphadenopathy.  No meningeal signs. CARDIOVASCULAR:  S1, S2 normal.  No murmurs. CHEST:  Bilateral air entry.  Peak pressure 20 on the respirator. ABDOMEN:  Soft, nontender. NEUROLOGIC:  Nonfocal. EXTREMITIES:  No edema.  PERTINENT LABORATORY AND X-RAY DATA:  Chest x-ray initially showed edema prior and a lingular infiltrate.  On the film post intubation, ET tube is 1 cm above the carina directed towards the right mainstem bronchus and there is hyperinflation of the left lower lobe.  WBC count 17.5, hemoglobin 14.7, platelets 317, 88% neutrophils.  Sodium 140, potassium 3.9, BUN/creatinine 40/1.7, glucose 119.  Ionized calcium 1.0.  UA:  Nitrite negative, leukocyte esterase negative, lactate 3.0. Urine drug screen positive for benzodiazepine.  Tylenol level less than 15.  Normal LFTs.  Alcohol and salicylate levels normal.  INR 1.0. Arterial blood gas was 7.41/42/356/100%  on 100% PEEP of 5.  IMPRESSION: 1. Acute respiratory failure. 2. Overdose of multiple pills including benzodiazepines (lorazepam and     zolpidem), Effexor, potassium, and possibly other pills.  RECOMMEND: 1. Ventilator settings will be PRVC/500/100%/PEEP of 5 with a     respiratory rate of 12.  Arterial blood gas seems to be okay on     these settings.  FIO2 will be lowered.  ET tube will be withdrawn 1     cm. 2. Neuro:  Very likely she will not need sedation.  Will wait for     these long-acting medicines to wear off. 3. Urine output appears to be good.  Potassium will be checked every 6     hours x3. 4. Hypotension seems to have resolved with fluids and a good urine     output indicates good organ perfusion.  Normal saline will be     continued at 100 an hour and D5-half will be started 50 an hour to     avoid hypoglycemia. 5. Upper GI bleeding:  Hemoglobin will be monitored.  IV Protonix will     be used for likely gastritis induced by these medications. 6. If she develops a fever or if infiltrate  worsens on x-ray,     antibiotic can be started for aspiration.  Note that she is     allergic to penicillin and clindamycin.  Likely Avelox can be used. 7. SCDs will be used for DVT prophylaxis.  If GI bleeding does not     worsen, Lovenox can be started in 24 hours.  This plan of care was discussed with the daughter.  She will bring in the remaining pill bottles and contact the nurse after she gets home to check on that.  Total Critical Care time spent was 50 minutes.     Comer Locket  Vassie Loll, MD     RVA/MEDQ  D:  08/23/2010  T:  08/23/2010  Job:  [id number]  cc:   Luanna Cole. Lenord Fellers, M.D. Fax: [phone]  Electronically Signed by Cyril Mourning MD on 08/31/2010 09:33:12 PM

## 2010-09-12 ENCOUNTER — Encounter: Payer: Self-pay | Admitting: Internal Medicine

## 2010-09-16 ENCOUNTER — Encounter: Payer: Self-pay | Admitting: Internal Medicine

## 2010-09-16 ENCOUNTER — Ambulatory Visit (INDEPENDENT_AMBULATORY_CARE_PROVIDER_SITE_OTHER): Payer: Medicare Other | Admitting: Internal Medicine

## 2010-09-16 DIAGNOSIS — E039 Hypothyroidism, unspecified: Secondary | ICD-10-CM

## 2010-09-16 DIAGNOSIS — E538 Deficiency of other specified B group vitamins: Secondary | ICD-10-CM

## 2010-09-16 DIAGNOSIS — Z79899 Other long term (current) drug therapy: Secondary | ICD-10-CM

## 2010-09-16 DIAGNOSIS — E119 Type 2 diabetes mellitus without complications: Secondary | ICD-10-CM

## 2010-09-16 DIAGNOSIS — R609 Edema, unspecified: Secondary | ICD-10-CM

## 2010-09-16 DIAGNOSIS — F329 Major depressive disorder, single episode, unspecified: Secondary | ICD-10-CM

## 2010-09-16 DIAGNOSIS — I10 Essential (primary) hypertension: Secondary | ICD-10-CM

## 2010-09-16 DIAGNOSIS — E785 Hyperlipidemia, unspecified: Secondary | ICD-10-CM

## 2010-09-16 DIAGNOSIS — E876 Hypokalemia: Secondary | ICD-10-CM

## 2010-09-16 LAB — BASIC METABOLIC PANEL
BUN: 11 mg/dL (ref 6–23)
CO2: 25 mEq/L (ref 19–32)
Chloride: 105 mEq/L (ref 96–112)
Creat: 0.78 mg/dL (ref 0.50–1.10)
Potassium: 3.5 mEq/L (ref 3.5–5.3)

## 2010-09-17 ENCOUNTER — Encounter: Payer: Self-pay | Admitting: Internal Medicine

## 2010-09-17 DIAGNOSIS — E785 Hyperlipidemia, unspecified: Secondary | ICD-10-CM | POA: Insufficient documentation

## 2010-09-17 DIAGNOSIS — F39 Unspecified mood [affective] disorder: Secondary | ICD-10-CM | POA: Insufficient documentation

## 2010-09-17 DIAGNOSIS — E1159 Type 2 diabetes mellitus with other circulatory complications: Secondary | ICD-10-CM | POA: Insufficient documentation

## 2010-09-17 DIAGNOSIS — R609 Edema, unspecified: Secondary | ICD-10-CM | POA: Insufficient documentation

## 2010-09-17 DIAGNOSIS — E119 Type 2 diabetes mellitus without complications: Secondary | ICD-10-CM | POA: Insufficient documentation

## 2010-09-17 DIAGNOSIS — F32A Depression, unspecified: Secondary | ICD-10-CM | POA: Insufficient documentation

## 2010-09-17 DIAGNOSIS — F329 Major depressive disorder, single episode, unspecified: Secondary | ICD-10-CM | POA: Insufficient documentation

## 2010-09-17 DIAGNOSIS — E1169 Type 2 diabetes mellitus with other specified complication: Secondary | ICD-10-CM | POA: Insufficient documentation

## 2010-09-17 DIAGNOSIS — I152 Hypertension secondary to endocrine disorders: Secondary | ICD-10-CM | POA: Insufficient documentation

## 2010-09-17 DIAGNOSIS — E876 Hypokalemia: Secondary | ICD-10-CM | POA: Insufficient documentation

## 2010-09-17 DIAGNOSIS — I10 Essential (primary) hypertension: Secondary | ICD-10-CM | POA: Insufficient documentation

## 2010-09-17 NOTE — Progress Notes (Signed)
Subjective:    Patient ID: Sarah [name], female    DOB: 06/26/1950, 60 y.o.   MRN: [id number]  HPI  60 year old white female recently admitted to intensive care unit 08/23/2010 through 08/26/2010 with acute respiratory failure failure with suspected aspiration after an overdose with lorazepam, Effexor and possibly other medications. She was intubated for airway protection and had questionable lingular pneumonia. Was admitted to ICU at [facility] long period white blood cell count was 9600. Antibiotics were prescribed and subsequently discontinued. She was noted to be hypertensive. She had a psychiatric evaluation and was subsequently was transferred to [facility] psychiatric unit. She is now living back at home. Long-standing history of major depressive disorder. She says psychiatrist found her to be B12 deficient. However hemoglobin in the hospital was 10.8 g with an MCV of 88.9. She was given IM B12 weekly and is now on oral B12. B12 level now is within normal limits. Potassium in the hospital was 3.2. She does take Lasix twice daily for dependent edema. This is long-standing. Patient tells me that she does not remember what happened to her. She says everything was going well and she was not acutely depressed. Doesn't remember taking the overdose.  Hospitalized previously April 2011 with drug overdose including clonazepam Ambien and gabapentin.  Long-standing history of hypertension, hyperlipidemia, dependent edema, hypokalemia secondary to diuretic therapy.  Family history remarkable for mother with mental problems.  Social history: she has one daughter who has been married twice,  one grandson ; relationship with daughter has frequently been strained due to daughter being not very responsible with money. Husband worked for [employer] but died of complications of pancreatic cancer and she has been depressed ever since. In reviewing old records she was actually being treated for depression since  Jun 05, 1996. Husband died in Jun 05, 2000. She subsequently remarried but that marriage did not work out and she is now divorced.  Past medical history bilateral tubal ligation by Dr. Judson Roch, D&C in 06/06/87, laparoscopic cholecystectomy 1992-06-05, lumbar fusion 06/05/2001, hysterectomy and bilateral salpingo-oophorectomy 06-Jun-2003. Fractured left elbow 06/05/04. Also diagnosed with diabetes Jun 05, 2009.  In July 2003 she took a nearly full dose of Ambien Restoril and Seroquel. [facility]-06-02 [facility] She's had several different psychiatrist. She has seen Dr. Elna Breslow and is now seeing PA in Dr. Loralie Champagne office. While she worked for [employer] she slipped on a PN lying on some steps in Jun 06, 1998 and began to have severe back problems which subsequently resulted in her getting disability. She was under the care of Dr. Vear Clock for pain management for a long time. She began to get better after Dr. Danielle Dess did lumbar fusion in Jun 05, 2001.    Review of Systems     Objective:   Physical Exam  chest clear; cardiac exam regular rate and rhythm; extremities trace edema. Patient smiles when she tells me she doesn't recall anything that happened to result in her recent her recent hospitalization which is disturbing to me. She denies being depressed or having a suicide attempt. However she's had 2 similar drug overdoses that were suicide attempts. She is to see Dr. Loralie Champagne PA and followup very soon. She is now on lithium. Lithium level was drawn today. She now thinks she is B12 deficient based on what she was told with recent psychiatric admission. She is taking oral B12 now. She's also back on Premarin for hot flashes.       Assessment &  Plan:  Respiratory failure secondary to multidrug ingestion-suspect suicide attempt  Depression  Hyperlipidemia  Diabetes mellitus  Dependent edema  Hypokalemia secondary to diuretic therapy  Hypertension  ? B12 deficiency  Plan:.  Encourage her to followup with Dr. Loralie Champagne physician assistant. I will see her again in 3 months and draw a B12 level at that time. We'll asked patient to get lab studies showing where B12 level was drawn and was supposedly low from [facility].    Time spent with patient 40 minutes

## 2010-10-14 LAB — POCT I-STAT, CHEM 8
BUN: 17
Calcium, Ion: 0.97 — ABNORMAL LOW
Chloride: 105
Creatinine, Ser: 1.1
Sodium: 140
TCO2: 27

## 2010-10-31 ENCOUNTER — Other Ambulatory Visit: Payer: Self-pay | Admitting: *Deleted

## 2010-10-31 MED ORDER — SIMVASTATIN 10 MG PO TABS: 10.0000 mg | ORAL_TABLET | Freq: Every day | ORAL | Status: DC

## 2010-11-05 LAB — CBC
Hemoglobin: 13.7
MCHC: 33.6
MCV: 85.3
RBC: 4.77
RDW: 12.8

## 2010-11-05 LAB — BASIC METABOLIC PANEL
CO2: 27
Calcium: 9.3
Chloride: 106
Glucose, Bld: 105 — ABNORMAL HIGH
Sodium: 140

## 2010-11-05 LAB — DIFFERENTIAL
Basophils Absolute: 0
Basophils Relative: 1
Eosinophils Absolute: 0.2
Eosinophils Relative: 2
Monocytes Absolute: 0.5
Monocytes Relative: 6
Neutro Abs: 5.2

## 2010-11-18 ENCOUNTER — Ambulatory Visit: Payer: Medicare Other | Admitting: Internal Medicine

## 2010-12-19 ENCOUNTER — Ambulatory Visit (INDEPENDENT_AMBULATORY_CARE_PROVIDER_SITE_OTHER): Payer: Medicare Other | Admitting: Internal Medicine

## 2010-12-19 ENCOUNTER — Encounter: Payer: Self-pay | Admitting: Internal Medicine

## 2010-12-19 VITALS — BP 108/72 | HR 64 | Temp 97.8°F | Wt 205.0 lb

## 2010-12-19 DIAGNOSIS — E785 Hyperlipidemia, unspecified: Secondary | ICD-10-CM

## 2010-12-19 DIAGNOSIS — R609 Edema, unspecified: Secondary | ICD-10-CM

## 2010-12-19 DIAGNOSIS — I10 Essential (primary) hypertension: Secondary | ICD-10-CM

## 2010-12-19 DIAGNOSIS — E119 Type 2 diabetes mellitus without complications: Secondary | ICD-10-CM

## 2010-12-19 DIAGNOSIS — E876 Hypokalemia: Secondary | ICD-10-CM

## 2010-12-19 DIAGNOSIS — Z79899 Other long term (current) drug therapy: Secondary | ICD-10-CM

## 2010-12-19 DIAGNOSIS — Z23 Encounter for immunization: Secondary | ICD-10-CM

## 2010-12-19 DIAGNOSIS — F329 Major depressive disorder, single episode, unspecified: Secondary | ICD-10-CM

## 2010-12-19 LAB — LIPID PANEL
Cholesterol: 266 mg/dL — ABNORMAL HIGH (ref 0–200)
HDL: 53 mg/dL (ref >39)
LDL Cholesterol: 181 mg/dL — ABNORMAL HIGH (ref 0–99)
Total CHOL/HDL Ratio: 5 Ratio
Triglycerides: 158 mg/dL — ABNORMAL HIGH (ref <150)
VLDL: 32 mg/dL (ref 0–40)

## 2010-12-19 LAB — HEPATIC FUNCTION PANEL
ALT: 8 U/L (ref 0–35)
AST: 10 U/L (ref 0–37)
Albumin: 4.2 g/dL (ref 3.5–5.2)
Alkaline Phosphatase: 77 U/L (ref 39–117)
Bilirubin, Direct: 0.1 mg/dL (ref 0.0–0.3)
Indirect Bilirubin: 0.4 mg/dL (ref 0.0–0.9)
Total Bilirubin: 0.5 mg/dL (ref 0.3–1.2)
Total Protein: 6.5 g/dL (ref 6.0–8.3)

## 2010-12-19 LAB — HEMOGLOBIN A1C
Hgb A1c MFr Bld: 5.9 % — ABNORMAL HIGH (ref <5.7)
Mean Plasma Glucose: 123 mg/dL — ABNORMAL HIGH (ref <117)

## 2010-12-20 NOTE — Progress Notes (Signed)
  Subjective:    Patient ID: Sarah [name], female    DOB: 1950-04-09, 60 y.o.   MRN: [id number]  HPI patient here today for six-month recheck. Has run out of simvastatin 10 mg daily just today. History of dependent edema and hypertension. History of depression and has had several overdoses. The last one was a few months ago and she says she does not remember anything about it. She sees PA with Dr. Andee Poles for psychiatric treatment. Says she's doing well today with no complaints and seems happy. Spend Thanksgiving with her daughter and grandson. Is not taking potassium supplement. History of glucose intolerance.    Review of Systems     Objective:   Physical Exam chest clear; cardiac exam regular rate and rhythm without murmur; extremities without edema. Diabetic foot exam is normal.        Assessment & Plan:  Hypertension  Hyperlipidemia  Hypokalemia secondary to diuretic therapy  Dependent edema  Glucose intolerance  Depression with history of several suicide attempts  Plan: Fasting lipid panel, liver functions,  and hemoglobin A1c drawn today along with b- met. Return in 6 months for physical examination. Influenza immunization given today  Addendum: Based on fasting lipid panel would increase simvastatin to 20 mg daily and recheck in 6 and a new prescription mailed to her today with six-month refills

## 2010-12-20 NOTE — Patient Instructions (Signed)
Continue current antihypertensive medications. Would encourage you to take potassium supplement. We have drawn today fasting lipid panel liver functions basic metabolic panel and hemoglobin J1B. Please try to watch her diet. Try to exercise. Return in 6 months for physical exam. Influenza immunization given today. We will notify you if we need to increase simvastatin dose based on your blood work today

## 2011-03-03 ENCOUNTER — Other Ambulatory Visit: Payer: Self-pay

## 2011-03-03 MED ORDER — FUROSEMIDE 40 MG PO TABS: 40.0000 mg | ORAL_TABLET | Freq: Two times a day (BID) | ORAL | Status: DC

## 2011-04-06 ENCOUNTER — Other Ambulatory Visit: Payer: Self-pay

## 2011-04-06 MED ORDER — ESTROGENS CONJUGATED 0.625 MG PO TABS: 0.6250 mg | ORAL_TABLET | Freq: Every day | ORAL | Status: DC

## 2011-06-03 ENCOUNTER — Observation Stay: Payer: Self-pay | Admitting: Internal Medicine

## 2011-06-03 LAB — COMPREHENSIVE METABOLIC PANEL
Albumin: 3.3 g/dL — ABNORMAL LOW (ref 3.4–5.0)
Bilirubin,Total: 0.4 mg/dL (ref 0.2–1.0)
Calcium, Total: 8.2 mg/dL — ABNORMAL LOW (ref 8.5–10.1)
Chloride: 108 mmol/L — ABNORMAL HIGH (ref 98–107)
EGFR (African American): >60
EGFR (Non-African Amer.): >60
Glucose: 107 mg/dL — ABNORMAL HIGH (ref 65–99)
Osmolality: 284 (ref 275–301)
Potassium: 3.4 mmol/L — ABNORMAL LOW (ref 3.5–5.1)
SGOT(AST): 15 U/L (ref 15–37)
SGPT (ALT): 12 U/L
Sodium: 142 mmol/L (ref 136–145)
Total Protein: 6.3 g/dL — ABNORMAL LOW (ref 6.4–8.2)

## 2011-06-03 LAB — URINALYSIS, COMPLETE
Blood: NEGATIVE
Ketone: NEGATIVE
Leukocyte Esterase: NEGATIVE
Protein: 30
RBC,UR: 1 /HPF (ref 0–5)
Specific Gravity: 1.025 (ref 1.003–1.030)
Squamous Epithelial: 30
WBC UR: 2 /HPF (ref 0–5)

## 2011-06-03 LAB — CBC
HGB: 13.3 g/dL (ref 12.0–16.0)
MCH: 30.8 pg (ref 26.0–34.0)
MCHC: 35.3 g/dL (ref 32.0–36.0)
MCV: 87 fL (ref 80–100)
Platelet: 237 10*3/uL (ref 150–440)
RBC: 4.33 10*6/uL (ref 3.80–5.20)
RDW: 13.2 % (ref 11.5–14.5)
WBC: 6.2 10*3/uL (ref 3.6–11.0)

## 2011-06-03 LAB — CK TOTAL AND CKMB (NOT AT ARMC)
CK, Total: 54 U/L (ref 21–215)
CK-MB: 1 ng/mL (ref 0.5–3.6)

## 2011-06-03 LAB — TROPONIN I
Troponin-I: <0.02 ng/mL
Troponin-I: <0.02 ng/mL

## 2011-06-03 LAB — PROTIME-INR: Prothrombin Time: 13 secs (ref 11.5–14.7)

## 2011-06-04 DIAGNOSIS — I200 Unstable angina: Secondary | ICD-10-CM

## 2011-06-04 LAB — LIPID PANEL
Ldl Cholesterol, Calc: 106 mg/dL — ABNORMAL HIGH (ref 0–100)
Triglycerides: 256 mg/dL — ABNORMAL HIGH (ref 0–200)
VLDL Cholesterol, Calc: 51 mg/dL — ABNORMAL HIGH (ref 5–40)

## 2011-06-04 LAB — MAGNESIUM: Magnesium: 1.9 mg/dL

## 2011-06-04 LAB — CBC WITH DIFFERENTIAL/PLATELET
Basophil #: 0.1 10*3/uL (ref 0.0–0.1)
HCT: 34.8 % — ABNORMAL LOW (ref 35.0–47.0)
Lymphocyte #: 2 10*3/uL (ref 1.0–3.6)
Lymphocyte %: 34.1 %
MCH: 30.2 pg (ref 26.0–34.0)
MCHC: 34.3 g/dL (ref 32.0–36.0)
MCV: 88 fL (ref 80–100)
Monocyte #: 0.4 x10 3/mm (ref 0.2–0.9)
Neutrophil %: 54.6 %
Platelet: 219 10*3/uL (ref 150–440)
RBC: 3.95 10*6/uL (ref 3.80–5.20)
RDW: 13.2 % (ref 11.5–14.5)

## 2011-06-04 LAB — BASIC METABOLIC PANEL
Anion Gap: 10 (ref 7–16)
BUN: 10 mg/dL (ref 7–18)
Chloride: 110 mmol/L — ABNORMAL HIGH (ref 98–107)
Creatinine: 0.7 mg/dL (ref 0.60–1.30)
EGFR (African American): >60
Osmolality: 290 (ref 275–301)
Sodium: 146 mmol/L — ABNORMAL HIGH (ref 136–145)

## 2011-06-04 LAB — CK TOTAL AND CKMB (NOT AT ARMC)
CK, Total: 44 U/L (ref 21–215)
CK-MB: 0.6 ng/mL (ref 0.5–3.6)

## 2011-06-16 ENCOUNTER — Ambulatory Visit (INDEPENDENT_AMBULATORY_CARE_PROVIDER_SITE_OTHER): Payer: Medicare Other | Admitting: Internal Medicine

## 2011-06-16 ENCOUNTER — Encounter: Payer: Self-pay | Admitting: Internal Medicine

## 2011-06-16 VITALS — BP 138/82 | HR 80 | Ht 64.25 in | Wt 218.0 lb

## 2011-06-16 DIAGNOSIS — M792 Neuralgia and neuritis, unspecified: Secondary | ICD-10-CM

## 2011-06-16 DIAGNOSIS — G5790 Unspecified mononeuropathy of unspecified lower limb: Secondary | ICD-10-CM

## 2011-06-16 DIAGNOSIS — D539 Nutritional anemia, unspecified: Secondary | ICD-10-CM

## 2011-06-16 DIAGNOSIS — F329 Major depressive disorder, single episode, unspecified: Secondary | ICD-10-CM

## 2011-06-16 DIAGNOSIS — I10 Essential (primary) hypertension: Secondary | ICD-10-CM

## 2011-06-16 DIAGNOSIS — E119 Type 2 diabetes mellitus without complications: Secondary | ICD-10-CM

## 2011-06-16 DIAGNOSIS — E559 Vitamin D deficiency, unspecified: Secondary | ICD-10-CM

## 2011-06-16 DIAGNOSIS — E785 Hyperlipidemia, unspecified: Secondary | ICD-10-CM

## 2011-06-16 DIAGNOSIS — E669 Obesity, unspecified: Secondary | ICD-10-CM

## 2011-06-16 LAB — LIPID PANEL
Cholesterol: 235 mg/dL — ABNORMAL HIGH (ref 0–200)
HDL: 46 mg/dL (ref >39)
LDL Cholesterol: 149 mg/dL — ABNORMAL HIGH (ref 0–99)
Triglycerides: 198 mg/dL — ABNORMAL HIGH (ref <150)
VLDL: 40 mg/dL (ref 0–40)

## 2011-06-17 LAB — VITAMIN D 25 HYDROXY (VIT D DEFICIENCY, FRACTURES): Vit D, 25-Hydroxy: 36 ng/mL (ref 30–89)

## 2011-07-03 ENCOUNTER — Other Ambulatory Visit: Payer: Self-pay

## 2011-07-03 MED ORDER — SIMVASTATIN 10 MG PO TABS: 10.0000 mg | ORAL_TABLET | Freq: Every day | ORAL | Status: DC

## 2011-09-19 NOTE — Patient Instructions (Addendum)
Continue same medications and return in 6 months 

## 2011-09-19 NOTE — Progress Notes (Signed)
Subjective:    Patient ID: Sarah [name], female    DOB: [birth year], 61 y.o.   MRN: [id number]  HPI 61 year old white female with history of type 2 diabetes mellitus, hypertension, hyperlipidemia, dependent edema, depression, hypokalemia secondary to diuretic therapy in today for health maintenance and evaluation of medical problems. She has had multiple suicide attempts by overdose. Is followed by physician assistant in Sarah [name]'s office. Last attempt was August 2012. At that time was sent to [facility] psychiatric unit. Long-standing history of major depressive disorder.  Hospitalized April 2011 with drug overdose including clonazepam, Ambien, and gabapentin. Husband died of complications of pancreatic cancer in 2002. She has been depressed since that time but actually was treated for depression since 1998. She subsequently remarried but that marriage did not work out. She is now divorced and resides alone. Has one daughter and one grandson.  In July 2003 she overdosed on Ambien Restoril and Seroquel. In 2004 she was hospitalized for 3 days at [facility]. She's had several different psychiatrist.  While she worked for [employer], she slipped on some steps in 2000 and began to have severe back problems which subsequently resulted in her getting disability. She was under the care of Sarah [name] for pain management for a long time. She began to get better after Sarah [name] did lumbar fusion in 2003.  Past medical history: Bilateral tubal ligation 1982, D&C 1989, laparoscopic cholecystectomy 1994, lumbar fusion 2003, hysterectomy with BSO 2005. Fractured left elbow 2006. Diagnosed with diabetes in May 2011  In May was hospitalized at [facility] with chest pain. MI was ruled out. There is some history of having B12 deficiency when hospitalized at [facility]. Apparently had low hemoglobin at that time but recent  hemoglobin at [facility] was normal at 13.3 g. She's been taking some oral B12. She is on Viibryd and Ativan for depression& she had a stress test the day after admission which showed no evidence of  ischemia. Was felt to have anxiety.  She is allergic to penicillin  Family history: Mother with history of bipolar disorder and and history of CABG. Father died with lung cancer but was a nonsmoker.  Patient denies smoking or alcohol consumption.     Review of Systems  Constitutional: Positive for fatigue.  HENT: Negative.   Eyes: Negative.   Respiratory: Negative.   Cardiovascular: Positive for leg swelling.  Hematological: Negative.   Psychiatric/Behavioral: Positive for dysphoric mood.       Depression and multiple admissions for drug overdoses in the past several years       Objective:   Physical Exam  Vitals reviewed. Constitutional: She is oriented to person, place, and time. She appears well-developed and well-nourished. No distress.  HENT:  Head: Normocephalic and atraumatic.  Right Ear: External ear normal.  Left Ear: External ear normal.  Mouth/Throat: Oropharynx is clear and moist. No oropharyngeal exudate.  Eyes: Conjunctivae and EOM are normal. Pupils are equal, round, and reactive to light. Right eye exhibits no discharge. Left eye exhibits no discharge. No scleral icterus.  Neck: Neck supple. No JVD present. No thyromegaly present.  Cardiovascular: Normal rate, regular rhythm and normal heart sounds.   No murmur heard. Pulmonary/Chest: Effort normal and breath sounds normal. No respiratory distress. She has no wheezes. She has no rales.       Tender parasternal area consistent with chest wall pain  Abdominal: Soft. Bowel sounds are normal. She  exhibits no distension and no mass. There is no tenderness. There is no rebound and no guarding.  Genitourinary:       Deferred status post hysterectomy BSO  Musculoskeletal: Normal range of motion. She  exhibits no edema.  Lymphadenopathy:    She has no cervical adenopathy.  Neurological: She is alert and oriented to person, place, and time. No cranial nerve deficit. Coordination normal.  Skin: Skin is warm and dry. No rash noted. She is not diaphoretic.  Psychiatric: She has a normal mood and affect. Her behavior is normal. Judgment and thought content normal.          Assessment & Plan:   Type 2 diabetes mellitus-controlled  Hypertension-controlled  Hyperlipidemia  Recent hospitalization for chest pain-negative stress test-suspect chest wall pain  Long-standing history of major depression status post multiple suicide attempts with drug overdoses last one was in August 2012. Has psychiatric followup on an ongoing basis  Dependent edema-treated with diuretics  Hypokalemia secondary to diuretic therapy  Obesity  Plan: Patient will return in 6 months for office visit, hemoglobin A1c, fasting lipid panel, B12 level and b-met. Recommend annual influenza immunization. Needs annual mammogram. Has not had recent bone density study. Suggested colonoscopy. Given 3 Hemoccult cards

## 2011-09-30 ENCOUNTER — Other Ambulatory Visit: Payer: Self-pay

## 2011-09-30 MED ORDER — ESTROGENS CONJUGATED 0.625 MG PO TABS: 0.6250 mg | ORAL_TABLET | Freq: Every day | ORAL | Status: DC

## 2011-12-24 ENCOUNTER — Encounter: Payer: Self-pay | Admitting: Internal Medicine

## 2011-12-24 ENCOUNTER — Ambulatory Visit (INDEPENDENT_AMBULATORY_CARE_PROVIDER_SITE_OTHER): Payer: Medicare Other | Admitting: Internal Medicine

## 2011-12-24 VITALS — BP 166/100 | HR 80 | Temp 97.1°F | Wt 218.0 lb

## 2011-12-24 DIAGNOSIS — M25512 Pain in left shoulder: Secondary | ICD-10-CM

## 2011-12-24 DIAGNOSIS — E119 Type 2 diabetes mellitus without complications: Secondary | ICD-10-CM

## 2011-12-24 DIAGNOSIS — Z23 Encounter for immunization: Secondary | ICD-10-CM

## 2011-12-24 DIAGNOSIS — M25519 Pain in unspecified shoulder: Secondary | ICD-10-CM

## 2011-12-24 DIAGNOSIS — F329 Major depressive disorder, single episode, unspecified: Secondary | ICD-10-CM

## 2011-12-24 DIAGNOSIS — E785 Hyperlipidemia, unspecified: Secondary | ICD-10-CM

## 2011-12-24 DIAGNOSIS — R609 Edema, unspecified: Secondary | ICD-10-CM

## 2011-12-24 DIAGNOSIS — I10 Essential (primary) hypertension: Secondary | ICD-10-CM

## 2011-12-24 DIAGNOSIS — Z79899 Other long term (current) drug therapy: Secondary | ICD-10-CM

## 2011-12-24 DIAGNOSIS — F319 Bipolar disorder, unspecified: Secondary | ICD-10-CM

## 2011-12-24 LAB — HEMOGLOBIN A1C
Hgb A1c MFr Bld: 5.8 % — ABNORMAL HIGH (ref <5.7)
Mean Plasma Glucose: 120 mg/dL — ABNORMAL HIGH (ref <117)

## 2011-12-24 LAB — BASIC METABOLIC PANEL
Calcium: 9.4 mg/dL (ref 8.4–10.5)
Glucose, Bld: 108 mg/dL — ABNORMAL HIGH (ref 70–99)
Potassium: 4.1 mEq/L (ref 3.5–5.3)
Sodium: 139 mEq/L (ref 135–145)

## 2011-12-24 LAB — HEPATIC FUNCTION PANEL
ALT: 12 U/L (ref 0–35)
Albumin: 4.4 g/dL (ref 3.5–5.2)
Bilirubin, Direct: 0.1 mg/dL (ref 0.0–0.3)
Total Protein: 6.8 g/dL (ref 6.0–8.3)

## 2011-12-24 LAB — LIPID PANEL
HDL: 51 mg/dL (ref >39)
Total CHOL/HDL Ratio: 4 Ratio
VLDL: 36 mg/dL (ref 0–40)

## 2011-12-24 MED ORDER — CLONIDINE HCL 0.1 MG PO TABS: 0.1000 mg | ORAL_TABLET | Freq: Two times a day (BID) | ORAL | Status: DC

## 2011-12-24 MED ORDER — LOSARTAN POTASSIUM 50 MG PO TABS: 50.0000 mg | ORAL_TABLET | Freq: Every day | ORAL | Status: DC

## 2011-12-25 DIAGNOSIS — Z23 Encounter for immunization: Secondary | ICD-10-CM

## 2011-12-26 DIAGNOSIS — F3341 Major depressive disorder, recurrent, in partial remission: Secondary | ICD-10-CM | POA: Insufficient documentation

## 2011-12-26 DIAGNOSIS — F319 Bipolar disorder, unspecified: Secondary | ICD-10-CM | POA: Insufficient documentation

## 2011-12-26 NOTE — Patient Instructions (Addendum)
Restart antihypertensive medication. Blood pressures not well controlled. Hemoglobin A1c is stable. Lipid panel is stable. Flu vaccine given today. Return in 3 weeks for reevaluation. Take pain medication sparingly for shoulder pain. Consider physical therapy if shoulder pain has not improved.

## 2011-12-26 NOTE — Progress Notes (Signed)
  Subjective:    Patient ID: Sarah [name], female    DOB: [birth year], 61 y.o.   MRN: [id number]  HPI 61 year old white female in today for followup of multiple medical problems including hypertension, hyperlipidemia, bipolar disorder, depression, hypokalemia secondary to diuretic therapy, dependent edema, type 2 diabetes mellitus.  Unfortunately she has stopped taking all antihypertensive medications including Cozaar and Lasix. She can't give a clear reason why she discontinued these medications. Suspect she may not have been taking generic Zocor either. We did a lipid panel liver functions today. Blood pressure is markedly elevated today at 166/100. B-met was drawn today before we knew she had not been taking medication.  Followed for bipolar disorder and depression and Dr. Rolly Pancake [facility]'s office. History of multiple drug overdoses do to mental issues.  Patient complaining of left shoulder and arm pain which is new. Just started a few days ago. She can move her arm up over her head although it is somewhat painful.    Review of Systems     Objective:   Physical Exam Chest is clear to auscultation; cardiac exam regular rate and rhythm; extremities without pitting edema; affect is slightly flat insight had, diabetic foot exam normal without calluses or fungal infection. She can abduct and adduct her left upper extremity. Muscle strength is normal in the left upper extremity. Deep tendon reflexes 2+ and symmetrical. Tender in the left parascapular area.       Assessment & Plan:  Hypertension-has not been taking Cozaar  Hyperlipidemia-not sure she's compliant with statin. However results showed lipids to be under fairly good control so I suspect she is compliant with this.  Bipolar disorder  Depression  History of hypokalemia secondary to diuretic therapy-currently not compliant with antihypertensive medication-stable  Dependent edema- suspect she's been taking diuretic but not  Cozaar  Type 2 diabetes mellitus-stable with hemoglobin A1c 5.8%  Left parascapular pain-musculoskeletal  Noncompliance with medication regimen  Plan: Patient agrees to restart medications. Is return in 2-3 weeks for reevaluation. May take Norco 5/325 up to twice daily for musculoskeletal pain. Patient advised to use sparingly. Pain does not improve in arm and shoulder, refer to physical therapy.

## 2012-01-05 ENCOUNTER — Telehealth: Payer: Self-pay | Admitting: Internal Medicine

## 2012-01-05 NOTE — Telephone Encounter (Signed)
Patient advised.

## 2012-01-05 NOTE — Telephone Encounter (Signed)
Patient should call [facility] orthopedics for appt. [phone]

## 2012-01-05 NOTE — Telephone Encounter (Signed)
Pt stated that she's still having right shoulder pain and finished taking Vicodin but pain is still there. She stated that she's been putting ice on it for 20 minutes but its not helping and she started taking two Advil last night but nothing is helping with her pain. Wants to know what Dr. Lenord Fellers recommends to do.

## 2012-01-18 ENCOUNTER — Ambulatory Visit (INDEPENDENT_AMBULATORY_CARE_PROVIDER_SITE_OTHER): Payer: Medicare Other | Admitting: Internal Medicine

## 2012-01-18 ENCOUNTER — Encounter: Payer: Self-pay | Admitting: Internal Medicine

## 2012-01-18 VITALS — BP 122/64 | Temp 97.8°F | Wt 220.0 lb

## 2012-01-18 DIAGNOSIS — I10 Essential (primary) hypertension: Secondary | ICD-10-CM

## 2012-01-18 DIAGNOSIS — E785 Hyperlipidemia, unspecified: Secondary | ICD-10-CM

## 2012-01-18 DIAGNOSIS — E669 Obesity, unspecified: Secondary | ICD-10-CM

## 2012-01-18 DIAGNOSIS — E119 Type 2 diabetes mellitus without complications: Secondary | ICD-10-CM

## 2012-01-18 DIAGNOSIS — Z8659 Personal history of other mental and behavioral disorders: Secondary | ICD-10-CM

## 2012-01-18 DIAGNOSIS — R609 Edema, unspecified: Secondary | ICD-10-CM

## 2012-01-18 NOTE — Progress Notes (Signed)
  Subjective:    Patient ID: Sarah [name], female    DOB: Mar 12, 1950, 61 y.o.   MRN: [id number]  HPI Patient here for follow up of HTN and hyperlipidemia. Had quit taking all meds . Now back on meds. Orthopedist treated her recently with a 6 day steroid dose pack for shoulder pain but did not help.  Sees orthopedist again on January 8th. No other complaints. No depression symptoms. Now on Lexapro instead of Vybryd per psychiatrist.    Review of Systems     Objective:   Physical Exam Chest clear to auscultation. Cardiac exam: regular rate and rhythm normal S1 and S2. Extremities: without significant pitting edema        Assessment & Plan:  Hypertension  Hyperlipidemia  Type 2 diabetes mellitus  History of depression  Left shoulder pain-under treatment orthopedist  Plan: Return in 6 months for office visit, lipid panel liver functions and hemoglobin A1c. Counseled patient to continue taking medications as directed.

## 2012-01-18 NOTE — Patient Instructions (Addendum)
Return in 6 months for office visit lipid panel liver functions and hemoglobin A1c. Continue with medications

## 2012-01-29 ENCOUNTER — Other Ambulatory Visit: Payer: Self-pay

## 2012-01-29 MED ORDER — SIMVASTATIN 10 MG PO TABS: 10.0000 mg | ORAL_TABLET | Freq: Every day | ORAL | Status: DC

## 2012-07-05 ENCOUNTER — Other Ambulatory Visit: Payer: Self-pay

## 2012-07-05 MED ORDER — LOSARTAN POTASSIUM 50 MG PO TABS: 50.0000 mg | ORAL_TABLET | Freq: Every day | ORAL | Status: DC

## 2012-07-19 ENCOUNTER — Encounter: Payer: Self-pay | Admitting: Internal Medicine

## 2012-07-19 ENCOUNTER — Ambulatory Visit (INDEPENDENT_AMBULATORY_CARE_PROVIDER_SITE_OTHER): Payer: Medicare Other | Admitting: Internal Medicine

## 2012-07-19 VITALS — BP 116/74 | HR 72 | Wt 211.0 lb

## 2012-07-19 DIAGNOSIS — Z79899 Other long term (current) drug therapy: Secondary | ICD-10-CM

## 2012-07-19 DIAGNOSIS — E785 Hyperlipidemia, unspecified: Secondary | ICD-10-CM

## 2012-07-19 DIAGNOSIS — E876 Hypokalemia: Secondary | ICD-10-CM

## 2012-07-19 DIAGNOSIS — I10 Essential (primary) hypertension: Secondary | ICD-10-CM

## 2012-07-19 DIAGNOSIS — G609 Hereditary and idiopathic neuropathy, unspecified: Secondary | ICD-10-CM

## 2012-07-19 DIAGNOSIS — E119 Type 2 diabetes mellitus without complications: Secondary | ICD-10-CM

## 2012-07-19 LAB — LIPID PANEL
Cholesterol: 201 mg/dL — ABNORMAL HIGH (ref 0–200)
HDL: 48 mg/dL (ref >39)
Triglycerides: 117 mg/dL (ref <150)

## 2012-07-19 LAB — HEPATIC FUNCTION PANEL
ALT: 12 U/L (ref 0–35)
AST: 12 U/L (ref 0–37)
Albumin: 3.9 g/dL (ref 3.5–5.2)
Alkaline Phosphatase: 62 U/L (ref 39–117)
Indirect Bilirubin: 0.4 mg/dL (ref 0.0–0.9)
Total Protein: 6.3 g/dL (ref 6.0–8.3)

## 2012-07-19 LAB — BASIC METABOLIC PANEL
CO2: 27 mEq/L (ref 19–32)
Calcium: 9.1 mg/dL (ref 8.4–10.5)
Chloride: 102 mEq/L (ref 96–112)
Creat: 1.02 mg/dL (ref 0.50–1.10)
Sodium: 139 mEq/L (ref 135–145)

## 2012-07-19 LAB — FOLATE: Folate: 12.1 ng/mL

## 2012-07-19 LAB — TSH: TSH: 1.654 u[IU]/mL (ref 0.350–4.500)

## 2012-07-19 NOTE — Progress Notes (Signed)
  Subjective:    Patient ID: Sarah [name], female    DOB: [birth year], 62 y.o.   MRN: [id number]  HPI 62 year old White female with history of bipolar disorder/ depression status post multiple suicide attempts. History of diabetes mellitus, hypertension, hypokalemia, hyperlipidemia, dependent edema. Has developed some numbness in her toes. Mother has similar issues. Diabetic control is always been good so not sure she has neuropathy due to diabetes. She does have history of lumbar spine issues and suspect is more related to that. It's not very disabling. She feels well. Her affect is bright today. Says life is good. She's taking care of grandchildren. Does a lot of quilting. Says Accu-Cheks are running quite good around 102.  Labs pending today include b- met, hemoglobin A1c, lipid panel liver functions. Will also check TSH B12 and folate  No issues with diabetic feet in terms of calluses or ulcers.   Reminded about annual eye exam.    Review of Systems     Objective:   Physical Exam Skin is warm and dry. Nodes none. Neck is supple without thyromegaly. No JVD. No carotid bruits. Chest clear to auscultation. Cardiac exam regular rate and rhythm normal S1 and S2. Extremities. No pitting edema. Pulses are normal in feet. No diabetic foot ulcers or calluses.        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 Continue same medications. Lab work is pending.

## 2012-08-25 ENCOUNTER — Other Ambulatory Visit: Payer: Self-pay

## 2012-08-25 MED ORDER — SIMVASTATIN 10 MG PO TABS: 10.0000 mg | ORAL_TABLET | Freq: Every day | ORAL | Status: DC

## 2012-10-03 ENCOUNTER — Other Ambulatory Visit: Payer: Self-pay

## 2012-10-03 MED ORDER — FUROSEMIDE 40 MG PO TABS: 40.0000 mg | ORAL_TABLET | Freq: Two times a day (BID) | ORAL | Status: DC

## 2012-12-27 ENCOUNTER — Other Ambulatory Visit: Payer: Self-pay | Admitting: *Deleted

## 2012-12-27 MED ORDER — LOSARTAN POTASSIUM 50 MG PO TABS: 50.0000 mg | ORAL_TABLET | Freq: Every day | ORAL | Status: DC

## 2013-01-15 NOTE — Patient Instructions (Signed)
Continue same medications and return in 6 months. Reminded about diabetic eye exam. Watch diet.

## 2013-01-26 ENCOUNTER — Other Ambulatory Visit: Payer: Self-pay | Admitting: Internal Medicine

## 2013-01-26 ENCOUNTER — Encounter: Payer: Medicare Other | Admitting: Internal Medicine

## 2013-02-16 ENCOUNTER — Ambulatory Visit (INDEPENDENT_AMBULATORY_CARE_PROVIDER_SITE_OTHER): Payer: Medicare Other | Admitting: Internal Medicine

## 2013-02-16 ENCOUNTER — Encounter: Payer: Self-pay | Admitting: Internal Medicine

## 2013-02-16 VITALS — BP 120/74 | HR 72 | Temp 97.7°F | Ht 64.5 in | Wt 219.0 lb

## 2013-02-16 DIAGNOSIS — Z130 Encounter for screening for diseases of the blood and blood-forming organs and certain disorders involving the immune mechanism: Secondary | ICD-10-CM

## 2013-02-16 DIAGNOSIS — E8881 Metabolic syndrome: Secondary | ICD-10-CM

## 2013-02-16 DIAGNOSIS — Z79899 Other long term (current) drug therapy: Secondary | ICD-10-CM

## 2013-02-16 DIAGNOSIS — G609 Hereditary and idiopathic neuropathy, unspecified: Secondary | ICD-10-CM

## 2013-02-16 DIAGNOSIS — E785 Hyperlipidemia, unspecified: Secondary | ICD-10-CM

## 2013-02-16 DIAGNOSIS — Z1329 Encounter for screening for other suspected endocrine disorder: Secondary | ICD-10-CM

## 2013-02-16 DIAGNOSIS — Z8659 Personal history of other mental and behavioral disorders: Secondary | ICD-10-CM

## 2013-02-16 DIAGNOSIS — I10 Essential (primary) hypertension: Secondary | ICD-10-CM

## 2013-02-16 DIAGNOSIS — E119 Type 2 diabetes mellitus without complications: Secondary | ICD-10-CM

## 2013-02-16 DIAGNOSIS — Z0000 Encounter for general adult medical examination without abnormal findings: Secondary | ICD-10-CM

## 2013-02-16 DIAGNOSIS — R609 Edema, unspecified: Secondary | ICD-10-CM

## 2013-02-16 DIAGNOSIS — Z13228 Encounter for screening for other metabolic disorders: Secondary | ICD-10-CM

## 2013-02-16 DIAGNOSIS — Z23 Encounter for immunization: Secondary | ICD-10-CM

## 2013-02-16 LAB — CBC WITH DIFFERENTIAL/PLATELET
Basophils Absolute: 0 10*3/uL (ref 0.0–0.1)
Basophils Relative: 0 % (ref 0–1)
EOS ABS: 0.2 10*3/uL (ref 0.0–0.7)
EOS PCT: 4 % (ref 0–5)
HCT: 37.6 % (ref 36.0–46.0)
Hemoglobin: 13.1 g/dL (ref 12.0–15.0)
LYMPHS ABS: 1.5 10*3/uL (ref 0.7–4.0)
Lymphocytes Relative: 22 % (ref 12–46)
MCH: 30 pg (ref 26.0–34.0)
MCHC: 34.8 g/dL (ref 30.0–36.0)
MCV: 86 fL (ref 78.0–100.0)
MONO ABS: 0.4 10*3/uL (ref 0.1–1.0)
MONOS PCT: 6 % (ref 3–12)
Neutro Abs: 4.6 10*3/uL (ref 1.7–7.7)
Neutrophils Relative %: 68 % (ref 43–77)
PLATELETS: 282 10*3/uL (ref 150–400)
RBC: 4.37 MIL/uL (ref 3.87–5.11)
RDW: 13.3 % (ref 11.5–15.5)
WBC: 6.8 10*3/uL (ref 4.0–10.5)

## 2013-02-16 LAB — POCT URINALYSIS DIPSTICK
Bilirubin, UA: NEGATIVE
Blood, UA: NEGATIVE
GLUCOSE UA: NEGATIVE
Ketones, UA: NEGATIVE
Leukocytes, UA: NEGATIVE
Nitrite, UA: NEGATIVE
PROTEIN UA: NEGATIVE
Spec Grav, UA: 1.015
Urobilinogen, UA: NEGATIVE
pH, UA: 6.5

## 2013-02-16 LAB — COMPREHENSIVE METABOLIC PANEL
ALT: 11 U/L (ref 0–35)
AST: 11 U/L (ref 0–37)
Albumin: 4 g/dL (ref 3.5–5.2)
Alkaline Phosphatase: 67 U/L (ref 39–117)
BILIRUBIN TOTAL: 0.5 mg/dL (ref 0.2–1.2)
BUN: 14 mg/dL (ref 6–23)
CO2: 31 meq/L (ref 19–32)
CREATININE: 0.75 mg/dL (ref 0.50–1.10)
Calcium: 8.8 mg/dL (ref 8.4–10.5)
Chloride: 100 mEq/L (ref 96–112)
GLUCOSE: 112 mg/dL — AB (ref 70–99)
Potassium: 4.1 mEq/L (ref 3.5–5.3)
Sodium: 140 mEq/L (ref 135–145)
Total Protein: 6.3 g/dL (ref 6.0–8.3)

## 2013-02-16 LAB — LIPID PANEL
CHOL/HDL RATIO: 4.1 ratio
CHOLESTEROL: 211 mg/dL — AB (ref 0–200)
HDL: 51 mg/dL (ref >39)
LDL Cholesterol: 125 mg/dL — ABNORMAL HIGH (ref 0–99)
TRIGLYCERIDES: 175 mg/dL — AB (ref <150)
VLDL: 35 mg/dL (ref 0–40)

## 2013-02-16 LAB — HEMOGLOBIN A1C
Hgb A1c MFr Bld: 6.1 % — ABNORMAL HIGH (ref <5.7)
Mean Plasma Glucose: 128 mg/dL — ABNORMAL HIGH (ref <117)

## 2013-02-16 LAB — TSH: TSH: 1.37 u[IU]/mL (ref 0.350–4.500)

## 2013-02-16 MED ORDER — GABAPENTIN 300 MG PO CAPS: ORAL_CAPSULE | ORAL | Status: DC

## 2013-02-16 MED ORDER — TETANUS-DIPHTH-ACELL PERTUSSIS 5-2.5-18.5 LF-MCG/0.5 IM SUSP: 0.5000 mL | Freq: Once | INTRAMUSCULAR | Status: DC

## 2013-02-16 NOTE — Patient Instructions (Addendum)
Increase Gabapentin to 2 caps tid. Return in 6 months. Otherwise continue same medications. Return in 3 Hemoccult cards.

## 2013-02-17 LAB — MICROALBUMIN, URINE: Microalb, Ur: 0.5 mg/dL (ref 0.00–1.89)

## 2013-02-17 LAB — VITAMIN D 25 HYDROXY (VIT D DEFICIENCY, FRACTURES): VIT D 25 HYDROXY: 29 ng/mL — AB (ref 30–89)

## 2013-02-24 ENCOUNTER — Other Ambulatory Visit: Payer: Self-pay | Admitting: Internal Medicine

## 2013-04-09 NOTE — Progress Notes (Signed)
Subjective:    Patient ID: Sarah [name], female    DOB: 1950/03/24, 63 y.o.   MRN: [id number]  HPI 63 year old white female in today for health maintenance and evaluation of medical issues. Patient is on disability for back issues and has issues of significant depression for a number of years. History of type 2 diabetes mellitus, hypertension, hyperlipidemia, dependent edema, hypokalemia secondary to diuretic therapy. Has had multiple suicide attempts by overdose. Last attempt was August 2012. Long-standing history of major depressive disorder.  Hospitalized April 2011 with drug overdose including clonazepam, Ambien, and gabapentin. Has been depressed since husband died of complications of pancreatic cancer in 2002. In July 2003 she overdosed on Ambien, Restoril, and Seroquel. In 2004 she was hospitalized for 3 days at [facility]. She's had several different psychiatrists. Currently followed by Dr. Caprice Beaver.  Social history: She has an adult daughter and one grandson. Patient remarried but that marriage did not work out and she is now divorced and resides alone. She formerly worked for [employer] She slipped on some steps in 2000 and began to have severe back problems which subsequently resulted in her obtaining disability. She was under the care of Dr. Hardin Negus for pain management for a long time. She began to get some better after Dr. Ellene Route did lumbar fusion in 2003.  Past medical history: Bilateral tubal ligation 1982, D&C 1989, laparoscopic cholecystectomy 1994, lumbar fusion 2003, hysterectomy with BSO in 2005. Fractured left elbow 2006.  Previous hospitalization for chest pain at [facility] where MI was ruled out.  She is allergic to Penicillin.  Patient denies smoking or alcohol consumption.  Family history: Mother with history of bipolar disorder and history of CABG. Father died with lung cancer but was a  nonsmoker.      Review of Systems  Constitutional: Negative for appetite change and fatigue.  Respiratory: Negative.   Cardiovascular: Negative.   Gastrointestinal: Negative.   Endocrine: Negative.   Genitourinary: Negative.   Neurological: Negative.        Complains of numbness in feet which is previously been treated with gabapentin  Hematological: Negative.   Psychiatric/Behavioral:       Travels to [place] from time to time to take care of her mother which she says is stressful.       Objective:   Physical Exam  Vitals reviewed. Constitutional: She is oriented to person, place, and time. She appears well-developed and well-nourished. No distress.  HENT:  Head: Normocephalic and atraumatic.  Right Ear: External ear normal.  Left Ear: External ear normal.  Mouth/Throat: Oropharynx is clear and moist. No oropharyngeal exudate.  Eyes: Conjunctivae and EOM are normal. Right eye exhibits no discharge. No scleral icterus.  Neck: Neck supple. No JVD present. No thyromegaly present.  Cardiovascular: Normal rate, regular rhythm, normal heart sounds and intact distal pulses.   No murmur heard. Pulmonary/Chest: No respiratory distress. She has no rales. She exhibits no tenderness.  Breasts normal female  Abdominal: Soft. Bowel sounds are normal. She exhibits no distension and no mass. There is no tenderness. There is no rebound and no guarding.  Genitourinary:  Deferred status post hysterectomy with BSO  Musculoskeletal: Normal range of motion. She exhibits no edema.  Lymphadenopathy:    She has no cervical adenopathy.  Neurological: She is alert and oriented to person, place, and time. She has normal reflexes. No cranial nerve deficit. Coordination normal.  Skin: Skin is dry. No rash noted. She  is not diaphoretic.  Psychiatric: She has a normal mood and affect. Her behavior is normal. Judgment and thought content normal.          Assessment & Plan:  History of major  depressive disorder followed by psychiatrist  Hyperlipidemia  Hypertension  History of vitamin D deficiency  Obesity  History of type 2 diabetes controlled  Metabolic syndrome  Peripheral neuropathy-etiology unclear  Plan: Continue same medications and return in 6 months. Encouraged diet and exercise. Overall seems to be doing fairly well. Increase gabapentin to 2 tablets 3 times daily. Subjective:   Patient presents for Medicare Annual/Subsequent preventive examination.   Review Past Medical/Family/Social: See above   Risk Factors  Current exercise habits: Mostly sedentary Dietary issues discussed: Low-fat low-carb  Cardiac risk factors: Hypertension, hyperlipidemia, diabetes  Depression Screen  (Note: if answer to either of the following is "Yes", a more complete depression screening is indicated)   Over the past two weeks, have you felt down, depressed or hopeless? Sometimes down but not frankly depressed Over the past two weeks, have you felt little interest or pleasure in doing things? No-most stressful thing right now is taking care of her mother Have you lost interest or pleasure in daily life? No Do you often feel hopeless? No Do you cry easily over simple problems? No   Activities of Daily Living  In your present state of health, do you have any difficulty performing the following activities?:   Driving? No  Managing money? No  Feeding yourself? No  Getting from bed to chair? No  Climbing a flight of stairs? No  Preparing food and eating?: No  Bathing or showering? No  Getting dressed: No  Getting to the toilet? No  Using the toilet:No  Moving around from place to place: No  In the past year have you fallen or had a near fall?:No  Are you sexually active? No  Do you have more than one partner? No   Hearing Difficulties: No  Do you often ask people to speak up or repeat themselves? No  Do you experience ringing or noises in your ears? No  Do you have  difficulty understanding soft or whispered voices? No  Do you feel that you have a problem with memory? No Do you often misplace items? No    Home Safety:  Do you have a smoke alarm at your residence? Yes Do you have grab bars in the bathroom? - Do you have throw rugs in your house? -   Cognitive Testing  Alert? Yes Normal Appearance?Yes  Oriented to person? Yes Place? Yes  Time? Yes  Recall of three objects? Yes  Can perform simple calculations? Yes  Displays appropriate judgment?Yes  Can read the correct time from a watch face?Yes   List the Names of Other Physician/Practitioners you currently use:  Dr. Caprice Beaver    Review of Systems: See above   Objective:     General appearance: Appears stated age and mildly obese  Head: Normocephalic, without obvious abnormality, atraumatic  Eyes: conj clear, EOMi PEERLA  Ears: normal TM's and external ear canals both ears  Nose: Nares normal. Septum midline. Mucosa normal. No drainage or sinus tenderness.  Throat: lips, mucosa, and tongue normal; teeth and gums normal  Neck: no adenopathy, no carotid bruit, no JVD, supple, symmetrical, trachea midline and thyroid not enlarged, symmetric, no tenderness/mass/nodules  No CVA tenderness.  Lungs: clear to auscultation bilaterally  Breasts: normal appearance, no masses or tenderness, top of the  pacemaker on left upper chest. Incision well-healed. It is tender.  Heart: regular rate and rhythm, S1, S2 normal, no murmur, click, rub or gallop  Abdomen: soft, non-tender; bowel sounds normal; no masses, no organomegaly  Musculoskeletal: ROM normal in all joints, no crepitus, no deformity, Normal muscle strengthen. Back  is symmetric, no curvature. Skin: Skin color, texture, turgor normal. No rashes or lesions  Lymph nodes: Cervical, supraclavicular, and axillary nodes normal.  Neurologic: CN 2 -12 Normal, Normal symmetric reflexes. Normal coordination and gait  Psych: Alert & Oriented x 3,  Mood appear stable.    Assessment:    Annual wellness medicare exam   Plan:    During the course of the visit the patient was educated and counseled about appropriate screening and preventive services including:  Annual mammogram  3 Hemoccult cards      Patient Instructions (the written plan) was given to the patient.  Medicare Attestation  I have personally reviewed:  The patient's medical and social history  Their use of alcohol, tobacco or illicit drugs  Their current medications and supplements  The patient's functional ability including ADLs,fall risks, home safety risks, cognitive, and hearing and visual impairment  Diet and physical activities  Evidence for depression or mood disorders  The patient's weight, height, BMI, and visual acuity have been recorded in the chart. I have made referrals, counseling, and provided education to the patient based on review of the above and I have provided the patient with a written personalized care plan for preventive services.

## 2013-07-26 ENCOUNTER — Other Ambulatory Visit: Payer: Self-pay | Admitting: Internal Medicine

## 2013-08-17 ENCOUNTER — Ambulatory Visit: Payer: Medicare Other | Admitting: Internal Medicine

## 2013-09-04 ENCOUNTER — Ambulatory Visit (INDEPENDENT_AMBULATORY_CARE_PROVIDER_SITE_OTHER): Payer: Medicare Other | Admitting: Internal Medicine

## 2013-09-04 VITALS — BP 122/76 | HR 68 | Temp 98.3°F | Wt 206.0 lb

## 2013-09-04 DIAGNOSIS — R609 Edema, unspecified: Secondary | ICD-10-CM

## 2013-09-04 DIAGNOSIS — Z862 Personal history of diseases of the blood and blood-forming organs and certain disorders involving the immune mechanism: Secondary | ICD-10-CM

## 2013-09-04 DIAGNOSIS — E119 Type 2 diabetes mellitus without complications: Secondary | ICD-10-CM

## 2013-09-04 DIAGNOSIS — Z79899 Other long term (current) drug therapy: Secondary | ICD-10-CM

## 2013-09-04 DIAGNOSIS — Z8639 Personal history of other endocrine, nutritional and metabolic disease: Secondary | ICD-10-CM

## 2013-09-04 DIAGNOSIS — F319 Bipolar disorder, unspecified: Secondary | ICD-10-CM

## 2013-09-04 DIAGNOSIS — E785 Hyperlipidemia, unspecified: Secondary | ICD-10-CM

## 2013-09-05 LAB — HEMOGLOBIN A1C
Hgb A1c MFr Bld: 5.9 % — ABNORMAL HIGH (ref <5.7)
MEAN PLASMA GLUCOSE: 123 mg/dL — AB (ref <117)

## 2013-09-05 LAB — LIPID PANEL
CHOL/HDL RATIO: 3.8 ratio
Cholesterol: 213 mg/dL — ABNORMAL HIGH (ref 0–200)
HDL: 56 mg/dL (ref >39)
LDL CALC: 133 mg/dL — AB (ref 0–99)
Triglycerides: 119 mg/dL (ref <150)
VLDL: 24 mg/dL (ref 0–40)

## 2013-09-05 LAB — HEPATIC FUNCTION PANEL
ALK PHOS: 78 U/L (ref 39–117)
ALT: 13 U/L (ref 0–35)
AST: 13 U/L (ref 0–37)
Albumin: 4.4 g/dL (ref 3.5–5.2)
BILIRUBIN DIRECT: 0.1 mg/dL (ref 0.0–0.3)
BILIRUBIN INDIRECT: 0.4 mg/dL (ref 0.2–1.2)
Total Bilirubin: 0.5 mg/dL (ref 0.2–1.2)
Total Protein: 6.5 g/dL (ref 6.0–8.3)

## 2013-09-07 ENCOUNTER — Ambulatory Visit: Payer: Medicare Other | Admitting: Internal Medicine

## 2013-10-05 ENCOUNTER — Other Ambulatory Visit: Payer: Self-pay | Admitting: Internal Medicine

## 2013-10-15 ENCOUNTER — Encounter: Payer: Self-pay | Admitting: Internal Medicine

## 2013-10-15 NOTE — Progress Notes (Signed)
   Subjective:    Patient ID: Alejandrina Raimer, female    DOB: [birth year], 63 y.o.   MRN: [id number]  HPI  In today for six-month recheck of hypertension, hyperlipidemia, dependent edema. Says things are going well. Depression seems to be stable. Enjoying life. Bipolar disorder under the care of Dr. Caprice Beaver. History of hypokalemia related to diuretic therapy.  With regard to hemoglobin A1c it has improved from 6.1% to 5.9%. LDL cholesterol has increased from 125-133. Triglycerides have improved from 175-119.  Patient probably doesn't diet and exercise as much as she should.    Review of Systems     Objective:   Physical Exam  Neck is supple without JVD thyromegaly or carotid bruits. Chest clear to auscultation. Cardiac exam regular rate and rhythm normal S1 and S2. Extremities without edema.      Assessment & Plan:  Hypertension-stable  Hyperlipidemia-triglycerides improved. Encouraged diet and exercise  Type 2 diabetes mellitus-hemoglobin A1c stable  Dependent edema-on diuretic therapy  History of hypokalemia secondary to diuretic therapy  History of bipolar disorder  Obesity  Plan: Continue same medications and return in 6 months. Encouraged diet exercise and weight loss

## 2013-10-15 NOTE — Patient Instructions (Signed)
Encouraged diet exercise and weight loss. Continue same medications. Return in 6 months for physical exam.

## 2013-11-10 ENCOUNTER — Other Ambulatory Visit: Payer: Self-pay | Admitting: Internal Medicine

## 2014-01-15 ENCOUNTER — Other Ambulatory Visit: Payer: Self-pay | Admitting: Internal Medicine

## 2014-02-13 ENCOUNTER — Other Ambulatory Visit: Payer: Self-pay | Admitting: Internal Medicine

## 2014-02-13 NOTE — Telephone Encounter (Signed)
Refill sent in for Catapres

## 2014-02-13 NOTE — Telephone Encounter (Signed)
Refill x 6 months 

## 2014-03-09 ENCOUNTER — Encounter: Payer: Medicare Other | Admitting: Internal Medicine

## 2014-03-14 ENCOUNTER — Other Ambulatory Visit: Payer: Self-pay | Admitting: Internal Medicine

## 2014-03-19 ENCOUNTER — Encounter: Payer: Self-pay | Admitting: Internal Medicine

## 2014-03-19 ENCOUNTER — Ambulatory Visit (INDEPENDENT_AMBULATORY_CARE_PROVIDER_SITE_OTHER): Payer: Medicare Other | Admitting: Internal Medicine

## 2014-03-19 VITALS — BP 100/64 | HR 66 | Temp 97.6°F | Wt 215.0 lb

## 2014-03-19 DIAGNOSIS — E8881 Metabolic syndrome: Secondary | ICD-10-CM

## 2014-03-19 DIAGNOSIS — E119 Type 2 diabetes mellitus without complications: Secondary | ICD-10-CM

## 2014-03-19 DIAGNOSIS — E669 Obesity, unspecified: Secondary | ICD-10-CM | POA: Diagnosis not present

## 2014-03-19 DIAGNOSIS — G609 Hereditary and idiopathic neuropathy, unspecified: Secondary | ICD-10-CM

## 2014-03-19 DIAGNOSIS — F329 Major depressive disorder, single episode, unspecified: Secondary | ICD-10-CM

## 2014-03-19 DIAGNOSIS — E559 Vitamin D deficiency, unspecified: Secondary | ICD-10-CM

## 2014-03-19 DIAGNOSIS — F32A Depression, unspecified: Secondary | ICD-10-CM

## 2014-03-19 DIAGNOSIS — I10 Essential (primary) hypertension: Secondary | ICD-10-CM | POA: Diagnosis not present

## 2014-03-19 DIAGNOSIS — E785 Hyperlipidemia, unspecified: Secondary | ICD-10-CM

## 2014-03-19 DIAGNOSIS — E875 Hyperkalemia: Secondary | ICD-10-CM | POA: Diagnosis not present

## 2014-03-19 DIAGNOSIS — Z0000 Encounter for general adult medical examination without abnormal findings: Secondary | ICD-10-CM | POA: Diagnosis not present

## 2014-03-19 DIAGNOSIS — R5383 Other fatigue: Secondary | ICD-10-CM

## 2014-03-19 DIAGNOSIS — G629 Polyneuropathy, unspecified: Secondary | ICD-10-CM | POA: Diagnosis not present

## 2014-03-19 LAB — CBC WITH DIFFERENTIAL/PLATELET
BASOS PCT: 1 % (ref 0–1)
Basophils Absolute: 0.1 10*3/uL (ref 0.0–0.1)
Eosinophils Absolute: 1.7 10*3/uL — ABNORMAL HIGH (ref 0.0–0.7)
Eosinophils Relative: 22 % — ABNORMAL HIGH (ref 0–5)
HCT: 39.3 % (ref 36.0–46.0)
Hemoglobin: 13.2 g/dL (ref 12.0–15.0)
LYMPHS ABS: 1.7 10*3/uL (ref 0.7–4.0)
Lymphocytes Relative: 21 % (ref 12–46)
MCH: 30 pg (ref 26.0–34.0)
MCHC: 33.6 g/dL (ref 30.0–36.0)
MCV: 89.3 fL (ref 78.0–100.0)
MPV: 10.2 fL (ref 8.6–12.4)
Monocytes Absolute: 0.5 10*3/uL (ref 0.1–1.0)
Monocytes Relative: 6 % (ref 3–12)
NEUTROS ABS: 4 10*3/uL (ref 1.7–7.7)
NEUTROS PCT: 50 % (ref 43–77)
Platelets: 259 10*3/uL (ref 150–400)
RBC: 4.4 MIL/uL (ref 3.87–5.11)
RDW: 13.9 % (ref 11.5–15.5)
WBC: 7.9 10*3/uL (ref 4.0–10.5)

## 2014-03-19 LAB — HEMOGLOBIN A1C
Hgb A1c MFr Bld: 6.3 % — ABNORMAL HIGH (ref <5.7)
MEAN PLASMA GLUCOSE: 134 mg/dL — AB (ref <117)

## 2014-03-19 LAB — COMPREHENSIVE METABOLIC PANEL
ALK PHOS: 82 U/L (ref 39–117)
ALT: 13 U/L (ref 0–35)
AST: 13 U/L (ref 0–37)
Albumin: 4.2 g/dL (ref 3.5–5.2)
BILIRUBIN TOTAL: 0.6 mg/dL (ref 0.2–1.2)
BUN: 14 mg/dL (ref 6–23)
CO2: 29 mEq/L (ref 19–32)
Calcium: 9.5 mg/dL (ref 8.4–10.5)
Chloride: 102 mEq/L (ref 96–112)
Creat: 0.84 mg/dL (ref 0.50–1.10)
GLUCOSE: 104 mg/dL — AB (ref 70–99)
Potassium: 4.6 mEq/L (ref 3.5–5.3)
Sodium: 143 mEq/L (ref 135–145)
Total Protein: 6.2 g/dL (ref 6.0–8.3)

## 2014-03-19 LAB — LIPID PANEL
Cholesterol: 226 mg/dL — ABNORMAL HIGH (ref 0–200)
HDL: 53 mg/dL (ref >=46)
LDL Cholesterol: 139 mg/dL — ABNORMAL HIGH (ref 0–99)
Total CHOL/HDL Ratio: 4.3 Ratio
Triglycerides: 172 mg/dL — ABNORMAL HIGH (ref <150)
VLDL: 34 mg/dL (ref 0–40)

## 2014-03-19 LAB — TSH: TSH: 1.677 u[IU]/mL (ref 0.350–4.500)

## 2014-03-19 LAB — POCT URINALYSIS DIPSTICK
Bilirubin, UA: NEGATIVE
Blood, UA: NEGATIVE
GLUCOSE UA: NEGATIVE
Ketones, UA: NEGATIVE
Leukocytes, UA: NEGATIVE
NITRITE UA: NEGATIVE
Protein, UA: NEGATIVE
SPEC GRAV UA: 1.01
UROBILINOGEN UA: NEGATIVE
pH, UA: 6

## 2014-03-19 LAB — VITAMIN B12: Vitamin B-12: 354 pg/mL (ref 211–911)

## 2014-03-19 NOTE — Patient Instructions (Addendum)
Continue same medications and return in 6 months. Lab work is pending. Diet exercise and weight loss. Try to lose 15 pounds before next visit. 3 Hemoccult cards is not willing to have colonoscopy.

## 2014-03-19 NOTE — Progress Notes (Signed)
Subjective:    Patient ID: Sarah [name], female    DOB: 1950/03/20, 64 y.o.   MRN: [id number]  HPI 64 year old white female in today for health maintenance exam and evaluation of medical problems. She is on disability for back issues and has issues of significant depression for a number of years. History of type 2 diabetes mellitus, hypertension, obesity, hyperlipidemia, dependent edema, hypokalemia secondary to diuretic therapy, metabolic syndrome. Has had multiple suicide attempts by overdose. Last attempt was August 2012. Long-standing history of major depressive disorder followed by psychiatrist. Currently seeing Karie Georges in Dr. Arvil Persons office for counseling.  Hospitalized April 2011 with drug overdose including clonazepam, Ambien, gabapentin. Has been depressed since her husband died of complications of pancreatic cancer in 2002. In July 2003 she overdosed on Ambien, Restoril, and Seroquel. In 2004 she was hospitalized for 3 days at [facility].  Social history: She has an adult daughter and one grandson. Patient remarried after husband died of that marriage did not work out. She is now divorced and resides alone. Recently met a man that she has dinner with once weekly. Not sexually active. She formerly worked for [employer] She slipped on some steps in 2000 and began to have severe back problems which subsequently resulted in her obtaining disability. She was under the care of Dr. Hardin Negus for pain management for long. Of time. She began to get some better after Dr. Ellene Route did lumbar fusion in 2003.  Patient does not smoke or consume alcohol  She is allergic penicillin  Hospitalized for chest pain at [facility] where MI was ruled out a few years ago.  Family history: Mother with history of bipolar disorder and history of CABG. Father died with lung cancer but was a nonsmoker. One brother age 45 in good health. One  sister.  Past history: Bilateral tubal ligation 1982, D&C 1989, laparoscopic cholecystectomy 1994, lumbar fusion 2003, hysterectomy with BSO in 2005. Fractured left elbow 2006.    Review of Systems  Constitutional: Negative.   HENT: Negative.   Respiratory: Negative.   Cardiovascular: Negative.   Gastrointestinal: Negative.   Neurological:       History of numbness in feet and which she has taken gabapentin.       Objective:   Physical Exam  Constitutional: She is oriented to person, place, and time. She appears well-developed and well-nourished. No distress.  HENT:  Head: Normocephalic and atraumatic.  Right Ear: External ear normal.  Left Ear: External ear normal.  Mouth/Throat: Oropharynx is clear and moist. No oropharyngeal exudate.  Eyes: Conjunctivae and EOM are normal. Pupils are equal, round, and reactive to light. Right eye exhibits no discharge. Left eye exhibits no discharge. No scleral icterus.  Neck: Neck supple. No JVD present. No thyromegaly present.  Cardiovascular: Normal rate, regular rhythm and normal heart sounds.   No murmur heard. Pulmonary/Chest: Effort normal and breath sounds normal. No respiratory distress. She has no wheezes. She has no rales. She exhibits no tenderness.  Breasts normal female without masses  Abdominal: Bowel sounds are normal. She exhibits no distension and no mass. There is no tenderness. There is no rebound and no guarding.  Genitourinary:  Deferred status post hysterectomy BSO  Musculoskeletal: Normal range of motion. She exhibits no edema.  Neurological: She is alert and oriented to person, place, and time. She has normal reflexes. She displays normal reflexes. No cranial nerve deficit. Coordination normal.  Skin: Skin is warm and  dry. No rash noted. She is not diaphoretic.  Psychiatric: She has a normal mood and affect. Her behavior is normal. Judgment and thought content normal.  Vitals reviewed.         Assessment &  Plan:  Type 2 diabetes mellitus-says glucoses been higher recently. Labs pending  Essential hypertension-blood pressure stable  Hyperlipidemia-labs pending  History of depression-treated by psychiatrist  Metabolic syndrome  Obesity  Hypokalemia secondary to diuretic therapy on potassium replacement  Dependent edema-none present today  Peripheral neuropathy-check B-12 level today  Plan: Continue same medications and return in 6 months. Fasting labs drawn and are pending. Recommend annual mammogram. Patient has lost 4 pounds since last physical exam. Would like for her to weigh under 200 pounds. She says she tried Nutrisystem which was very expensive and lost 17 pounds in one month. Considering Weight Watchers.     Subjective:   Patient presents for Medicare Annual/Subsequent preventive examination.  Review Past Medical/Family/Social: See above   Risk Factors  Current exercise habits: Says she walks 10,000 steps daily Dietary issues discussed: Diabetic diet-low carb low-fat  Cardiac risk factors: Diabetes mellitus, hypertension, hyperlipidemia  Depression Screen  (Note: if answer to either of the following is "Yes", a more complete depression screening is indicated)   Over the past two weeks, have you felt down, depressed or hopeless? No  Over the past two weeks, have you felt little interest or pleasure in doing things? No Have you lost interest or pleasure in daily life? No Do you often feel hopeless? No Do you cry easily over simple problems? No   Activities of Daily Living  In your present state of health, do you have any difficulty performing the following activities?:   Driving? No  Managing money? No  Feeding yourself? No  Getting from bed to chair? No  Climbing a flight of stairs? No  Preparing food and eating?: No  Bathing or showering? No  Getting dressed: No  Getting to the toilet? No  Using the toilet:No  Moving around from place to place: No  In  the past year have you fallen or had a near fall?:yes Are you sexually active? No  Do you have more than one partner? No   Hearing Difficulties: No  Do you often ask people to speak up or repeat themselves? No  Do you experience ringing or noises in your ears? No  Do you have difficulty understanding soft or whispered voices? No  Do you feel that you have a problem with memory? No Do you often misplace items? No    Home Safety:  Do you have a smoke alarm at your residence? Yes Do you have grab bars in the bathroom? No Do you have throw rugs in your house? Yes   Cognitive Testing  Alert? Yes Normal Appearance?Yes  Oriented to person? Yes Place? Yes  Time? Yes  Recall of three objects? Yes  Can perform simple calculations? Yes  Displays appropriate judgment?Yes  Can read the correct time from a watch face?Yes   List the Names of Other Physician/Practitioners you currently use:  See referral list for the physicians patient is currently seeing.  Counselor with Dr. Caprice Beaver   Review of Systems: See above   Objective:     General appearance: Appears stated age and  obese  Head: Normocephalic, without obvious abnormality, atraumatic  Eyes: conj clear, EOMi PEERLA  Ears: normal TM's and external ear canals both ears  Nose: Nares normal. Septum midline. Mucosa normal. No  drainage or sinus tenderness.  Throat: lips, mucosa, and tongue normal; teeth and gums normal  Neck: no adenopathy, no carotid bruit, no JVD, supple, symmetrical, trachea midline and thyroid not enlarged, symmetric, no tenderness/mass/nodules  No CVA tenderness.  Lungs: clear to auscultation bilaterally  Breasts: normal appearance, no masses or tenderness Heart: regular rate and rhythm, S1, S2 normal, no murmur, click, rub or gallop  Abdomen: soft, non-tender; bowel sounds normal; no masses, no organomegaly  Musculoskeletal: ROM normal in all joints, no crepitus, no deformity, Normal muscle strengthen. Back   is symmetric, no curvature. Skin: Skin color, texture, turgor normal. No rashes or lesions  Lymph nodes: Cervical, supraclavicular, and axillary nodes normal.  Neurologic: CN 2 -12 Normal, Normal symmetric reflexes. Normal coordination and gait  Psych: Alert & Oriented x 3, Mood appear stable.    Assessment:    Annual wellness medicare exam   Plan:    During the course of the visit the patient was educated and counseled about appropriate screening and preventive services including:   Annual mammogram     Patient Instructions (the written plan) was given to the patient.  Medicare Attestation  I have personally reviewed:  The patient's medical and social history  Their use of alcohol, tobacco or illicit drugs  Their current medications and supplements  The patient's functional ability including ADLs,fall risks, home safety risks, cognitive, and hearing and visual impairment  Diet and physical activities  Evidence for depression or mood disorders  The patient's weight, height, BMI, and visual acuity have been recorded in the chart. I have made referrals, counseling, and provided education to the patient based on review of the above and I have provided the patient with a written personalized care plan for preventive services.

## 2014-03-20 LAB — MICROALBUMIN / CREATININE URINE RATIO
Creatinine, Urine: 29.2 mg/dL
Microalb Creat Ratio: 6.8 mg/g (ref 0.0–30.0)
Microalb, Ur: 0.2 mg/dL

## 2014-03-20 LAB — VITAMIN D 25 HYDROXY (VIT D DEFICIENCY, FRACTURES): VIT D 25 HYDROXY: 16 ng/mL — AB (ref 30–100)

## 2014-05-13 NOTE — Discharge Summary (Signed)
PATIENT NAME:  Sarah [name], Sarah [name] MR#:  [id number] DATE OF BIRTH:  1950/04/10  DATE OF ADMISSION:  06/03/2011 DATE OF DISCHARGE:  06/04/2011  HISTORY OF PRESENT ILLNESS: For a detailed note, please take a look at the history and physical done by Dr. Gladstone Lighter on admission.   DIAGNOSES AT DISCHARGE:  1. Chest pain, likely related to anxiety.  2. Hypertension.  3. Hyperlipidemia.  4. Depression.   DIET: The patient is being discharged on a low-sodium diet.   ACTIVITY: As tolerated.   FOLLOW-UP: Follow-up Dr. Tedra Senegal in [place] in the next 1 to 2 weeks.   DISCHARGE MEDICATIONS:  1. Ambien 10 mg at bedtime as needed.  2. Lasix 40 mg b.i.d.  3. Viibryd 40 mg daily.  4. Lorazepam 1 mg t.i.d. as needed.  5. Simvastatin 20 mg daily.  6. Gabapentin 300 mg daily.  7. Clonidine 0.1 mg b.i.d.  8. Premarin 0.625 one tab daily.  9. Vitamin D3 400 units 1 tab daily.   PERTINENT STUDIES DURING HOSPITAL COURSE: Chest x-ray done on admission showing no acute cardiopulmonary disease. Nuclear medicine stress test done on the day after admission showed no evidence of ischemia or infarct, normal LV function, ejection fraction 60%,   HOSPITAL COURSE: This is a 64 year old female with medical problems as mentioned above presented to the hospital with chest pain suspicious for angina.   1. Chest pain. The patient's chest pain is likely related to anxiety. The patient was observed on telemetry, had three sets of cardiac markers which were negative. She was maintained on aspirin, nitroglycerin, and statin while here. Had a stress test day the after admission which showed no evidence of any acute myocardial ischemia and, therefore, was discharged home.  2. Hypertension. The patient remained hemodynamically stable. She will resume clonidine and Lasix.  3. Depression. The patient was maintained on Viibryd and Ativan. She will resume that upon discharge.  4. Hyperlipidemia. The patient was maintained  on simvastatin. She will resume that upon discharge.   CODE STATUS: The patient is a FULL CODE.   TIME SPENT: 35 minutes.    ____________________________ Belia Heman. Verdell Carmine, MD [id number] D: 06/12/2011 15:38:28 ET T: 06/15/2011 11:51:45 ET JOB#: [id number]  cc: Belia Heman. Verdell Carmine, MD, <Dictator> Dr. Tedra Senegal in [facility] MD ELECTRONICALLY SIGNED 06/15/2011 15:25

## 2014-05-13 NOTE — H&P (Signed)
PATIENT NAME:  Sarah [name], Sarah [name] MR#:  [id number] DATE OF BIRTH:  1950/11/09  DATE OF ADMISSION:  06/03/2011  ADMITTING PHYSICIAN: Dr. Gladstone Lighter PRIMARY CARE PHYSICIAN: Dr. Tedra Senegal in [place]: Chest pain.   HISTORY OF PRESENT ILLNESS: Sarah [name] is a 64 year old Caucasian female with past medical history significant for hypertension, depression and anxiety who lives at home by herself comes to the hospital complaining of chest pain that started last night. Patient says she did some housework and yard work over the last couple of days which she usually does, nothing over exertional. By the end of the day she started having heavy pain in her chest in the midsternal area which was nonradiating, was quite tearful about it when she spoke. Was also associated with difficulty catching breathing and also dizziness. She tried to lie down but couldn't sleep because of the pain. Pain persisted in the morning and her neighbor came over to check on her and insisted on coming to the hospital. Patient's blood pressure on arrival to the ED was 233/105, but she was also in pain and very anxious. She did have a cardiac catheterization back in 2006 which was normal at that time. Her blood pressure has been elevated during her PCPs visit last fall which is when she was started on clonidine.    PAST MEDICAL HISTORY:  1. Hypertension.  2. Depression and anxiety.   PAST SURGICAL HISTORY:  1. Cholecystectomy.  2. Hysterectomy.  3. Back surgery twice.  4. Cardiac catheterization in 2006, which was normal   ALLERGIES TO MEDICATIONS: Penicillin.   MEDICATIONS AT HOME:  1. Ambien 10 mg p.o. at bedtime.  2. Clonidine 0.1 mg p.o. b.i.d.  3. Lasix 40 mg p.o. b.i.d.  4. Gabapentin 300 mg p.o. daily.   5. Lorazepam 1 mg p.o. t.i.d.  6. Premarin 0.625 mg daily.  7. Simvastatin 20 mg p.o. daily.  8. Viibryd 40 mg p.o. daily.  9. Vitamin D3 1 tablet daily.   SOCIAL HISTORY: She is widowed.  Lives at home by herself. No smoking or alcohol use.   FAMILY HISTORY: Mom is still alive but has heart disease and also history of bypass graft surgery. Father died young with lung cancer, though he was not a smoker.   REVIEW OF SYSTEMS: CONSTITUTIONAL: No fever, fatigue or weakness. EYES: No blurred vision, double vision. Uses reading glasses. No glaucoma or cataracts. ENT: No tinnitus, ear pain, hearing loss, epistaxis or discharge. RESPIRATORY: No cough, wheeze, hemoptysis, or chronic obstructive pulmonary disease. CARDIOVASCULAR: Positive for chest pain. No orthopnea, edema, arrhythmia, palpitations, or syncope. GASTROINTESTINAL: No nausea, vomiting, abdominal pain, hematemesis, or melena. GENITOURINARY: No dysuria, hematuria, renal calculus, frequency, or incontinence. ENDOCRINE: No polyuria, nocturia, thyroid problems, heat or cold intolerance. HEMATOLOGY: No anemia, easy bruising or bleeding. SKIN: No acne, rash, or lesions. MUSCULOSKELETAL: No neck, back, shoulder pain, arthritis, or gout. NEUROLOGIC: No numbness, weakness, cerebrovascular accident, transient ischemic attack, or seizures. PSYCHOLOGICAL: Positive for anxiety. No insomnia or depression.   PHYSICAL EXAMINATION:  VITAL SIGNS: Temperature 97.6 degrees Fahrenheit, pulse 80, respirations 22, blood pressure 233/105, improved up to 139/90, pulse ox 100% on room air.   GENERAL: Well built, well nourished female, very tearful currently, not in any acute distress.   HEENT: Normocephalic, atraumatic. Pupils equal, round, reacting to light. Anicteric sclerae. Extraocular movements intact. Oropharynx clear without erythema, mass, or exudates.   NECK: Supple. No thyromegaly, JVD or carotid bruits. No lymphadenopathy.   LUNGS: Clear  to auscultation bilaterally. No wheeze or crackles. No use of accessory muscles for breathing.   CARDIOVASCULAR: S1, S2 regular rate and rhythm. No murmurs, rubs, or gallops.   ABDOMEN: Soft, nontender,  nondistended. No hepatosplenomegaly. Normal bowel sounds.   EXTREMITIES: No pedal edema. No clubbing or cyanosis. 2+ dorsalis pedis pulses palpable bilaterally.   SKIN: No acne, rash, or lesions.   LYMPHATICS: No cervical lymphadenopathy.   NEUROLOGICAL: Cranial nerves intact. No focal motor or sensory deficits.   PSYCH: Patient is awake, alert, oriented x3.   LABORATORY, DIAGNOSTIC AND RADIOLOGICAL DATA: WBC 6.2, hemoglobin 13.3, hematocrit 37.7, platelet count 237.Marland Kitchen   Sodium 142, potassium 3.4, chloride 108, bicarbonate 26, BUN 13, creatinine 0.74, glucose 107, calcium 8.2.  ALT 12, AST 15, alkaline phosphatase 56, total bilirubin 0.4, albumin 3.3. CK 61, CK-MB 1.1, troponin less than 0.02. INR 0.9. Urinalysis negative for any infection. Chest x-ray showing clear lung fields. No acute changes identified. EKG showing normal sinus rhythm with left axis deviation. No acute ST-T wave changes present.   ASSESSMENT AND PLAN: 64 year old female with hypertension, depression and anxiety comes in with chest pain. Her cardiac enzymes, both sets, are negative and blood pressure elevated at 233/105.  1. Chest pain. Could be unstable angina. She does have risk factors with hypertension and family history. Last cardiac catheterization was in 2006, which was normal. Has not had any stress test lately so will admit the patient to telemetry under observation. Get Myoview in the morning. Continue aspirin and nitroglycerin for now. Check lipid profile and add statin if needed. Blood pressure poorly controlled. Cannot add any beta blocker because her heart rate is in the 50s.  2. Hypertensive urgency. Continue clonidine and Lasix which are her home medications. Blood pressure is at 139/60. Cannot add any other blood pressure medications at this time so will do hydralazine IV p.r.n. She is also on nitro paste currently.  3. Depression and anxiety. Continue home medications. 4. GI and DVT prophylaxis. On Protonix  and Lovenox.  5. CODE STATUS: FULL CODE.   TIME SPENT ON ADMISSION: 50 minutes.   ____________________________ Gladstone Lighter, MD rk:cms D: 06/03/2011 12:48:39 ET T: 06/03/2011 13:20:33 ET JOB#: [id number]  cc: Gladstone Lighter, MD, <Dictator> Dr. Tedra Senegal in [facility] MD ELECTRONICALLY SIGNED 06/03/2011 14:23

## 2014-05-14 ENCOUNTER — Other Ambulatory Visit: Payer: Self-pay | Admitting: Internal Medicine

## 2014-08-10 ENCOUNTER — Other Ambulatory Visit: Payer: Self-pay | Admitting: Internal Medicine

## 2014-09-20 ENCOUNTER — Encounter: Payer: Self-pay | Admitting: Internal Medicine

## 2014-09-20 ENCOUNTER — Ambulatory Visit
Admission: RE | Admit: 2014-09-20 | Discharge: 2014-09-20 | Disposition: A | Discharge status: Home / Self Care | Payer: Medicare Other | Source: Ambulatory Visit | Attending: Internal Medicine | Admitting: Internal Medicine

## 2014-09-20 ENCOUNTER — Ambulatory Visit (INDEPENDENT_AMBULATORY_CARE_PROVIDER_SITE_OTHER): Payer: Medicare Other | Admitting: Internal Medicine

## 2014-09-20 VITALS — BP 108/68 | HR 63 | Temp 97.9°F | Wt 202.5 lb

## 2014-09-20 DIAGNOSIS — M542 Cervicalgia: Secondary | ICD-10-CM

## 2014-09-20 DIAGNOSIS — E119 Type 2 diabetes mellitus without complications: Secondary | ICD-10-CM

## 2014-09-20 DIAGNOSIS — Z23 Encounter for immunization: Secondary | ICD-10-CM | POA: Diagnosis not present

## 2014-09-20 DIAGNOSIS — E785 Hyperlipidemia, unspecified: Secondary | ICD-10-CM

## 2014-09-20 DIAGNOSIS — Z79899 Other long term (current) drug therapy: Secondary | ICD-10-CM | POA: Diagnosis not present

## 2014-09-20 LAB — HEPATIC FUNCTION PANEL
ALBUMIN: 4.2 g/dL (ref 3.6–5.1)
ALK PHOS: 77 U/L (ref 33–130)
ALT: 11 U/L (ref 6–29)
AST: 11 U/L (ref 10–35)
BILIRUBIN TOTAL: 0.6 mg/dL (ref 0.2–1.2)
Bilirubin, Direct: 0.1 mg/dL (ref <=0.2)
Indirect Bilirubin: 0.5 mg/dL (ref 0.2–1.2)
TOTAL PROTEIN: 6.4 g/dL (ref 6.1–8.1)

## 2014-09-20 LAB — HEMOGLOBIN A1C
HEMOGLOBIN A1C: 5.9 % — AB (ref <5.7)
MEAN PLASMA GLUCOSE: 123 mg/dL — AB (ref <117)

## 2014-09-20 LAB — LIPID PANEL
CHOL/HDL RATIO: 4 ratio (ref <=5.0)
Cholesterol: 179 mg/dL (ref 125–200)
HDL: 45 mg/dL — ABNORMAL LOW (ref >=46)
LDL CALC: 112 mg/dL (ref <130)
Triglycerides: 110 mg/dL (ref <150)
VLDL: 22 mg/dL (ref <30)

## 2014-09-20 MED ORDER — CYCLOBENZAPRINE HCL 10 MG PO TABS
ORAL_TABLET | ORAL | Status: DC
Start: 2014-09-20 — End: 2016-08-11

## 2014-09-20 NOTE — Addendum Note (Signed)
Addended by: Leota Jacobsen on: 09/20/2014 10:30 AM   Modules accepted: Orders

## 2014-09-20 NOTE — Patient Instructions (Signed)
Neck Xrays. Ice to neck 20 minutes twice a day. Flexeril at night. RTC in 6 months for CPE Labs pending.

## 2014-09-20 NOTE — Progress Notes (Signed)
   Subjective:    Patient ID: Sarah [name], female    DOB: [birth year], 64 y.o.   MRN: [id number]  HPI Here today for six-month recheck on diabetes essential hypertension, bipolar disorder, hypokalemia secondary to diuretic therapy, hyperlipidemia. Fasting labs drawn and pending.  On Tuesday, August 23 she missed a step going up several steps inside her home and fell backwards down approximately 10 steps. Having some left neck pain. No numbness or tingling in the extremities. No weakness. Hurts to turn her neck to the right.    Review of Systems     Objective:   Physical Exam Deep tendon reflexes in the upper extremity steep plus and symmetrical. Muscle strength is normal. Has palpable muscle spasm left sternocleidomastoid muscle area. She is intact without focal deficits. Funduscopic exam is benign.       Assessment & Plan:  Neck pain secondary to fall down steps on August 23 with muscle spasm  Control tight 2 diabetes mellitus-hemoglobin A1c drawn and pending  Essential hypertension-stable on current regimen  Hyperlipidemia-lipid panel liver functions pending  Plan: Reminded about diabetic eye exam. Flu vaccine given. Return in 6 months for physical exam. Have C-spine films. Flexeril 10 mg 1/2-1 by mouth daily at bedtime. Ice neck down for 20 minutes twice daily. If neck pain does not improve, refer to physical therapy

## 2014-09-21 ENCOUNTER — Telehealth: Payer: Self-pay | Admitting: *Deleted

## 2014-09-21 NOTE — Telephone Encounter (Signed)
Spoke with patient reviewed xray results. 

## 2014-09-21 NOTE — Telephone Encounter (Signed)
Left message for patient to call back to review xray results

## 2014-10-10 ENCOUNTER — Other Ambulatory Visit: Payer: Self-pay | Admitting: Internal Medicine

## 2014-11-05 ENCOUNTER — Other Ambulatory Visit: Payer: Self-pay | Admitting: Internal Medicine

## 2015-02-20 ENCOUNTER — Other Ambulatory Visit: Payer: Self-pay | Admitting: Internal Medicine

## 2015-03-07 ENCOUNTER — Encounter (HOSPITAL_COMMUNITY): Payer: Self-pay | Admitting: Emergency Medicine

## 2015-03-07 ENCOUNTER — Emergency Department (HOSPITAL_COMMUNITY)
Admission: EM | Admit: 2015-03-07 | Discharge: 2015-03-07 | Disposition: A | Discharge status: Home / Self Care | Payer: Medicare Other | Attending: Emergency Medicine | Admitting: Emergency Medicine

## 2015-03-07 ENCOUNTER — Emergency Department (HOSPITAL_COMMUNITY): Payer: Medicare Other

## 2015-03-07 ENCOUNTER — Other Ambulatory Visit: Payer: Self-pay

## 2015-03-07 DIAGNOSIS — S3992XA Unspecified injury of lower back, initial encounter: Secondary | ICD-10-CM | POA: Insufficient documentation

## 2015-03-07 DIAGNOSIS — M545 Low back pain, unspecified: Secondary | ICD-10-CM

## 2015-03-07 DIAGNOSIS — G4700 Insomnia, unspecified: Secondary | ICD-10-CM | POA: Diagnosis not present

## 2015-03-07 DIAGNOSIS — S4992XA Unspecified injury of left shoulder and upper arm, initial encounter: Secondary | ICD-10-CM | POA: Diagnosis not present

## 2015-03-07 DIAGNOSIS — Z8719 Personal history of other diseases of the digestive system: Secondary | ICD-10-CM | POA: Diagnosis not present

## 2015-03-07 DIAGNOSIS — F419 Anxiety disorder, unspecified: Secondary | ICD-10-CM | POA: Diagnosis not present

## 2015-03-07 DIAGNOSIS — Y9389 Activity, other specified: Secondary | ICD-10-CM | POA: Diagnosis not present

## 2015-03-07 DIAGNOSIS — E785 Hyperlipidemia, unspecified: Secondary | ICD-10-CM | POA: Diagnosis not present

## 2015-03-07 DIAGNOSIS — E119 Type 2 diabetes mellitus without complications: Secondary | ICD-10-CM | POA: Diagnosis not present

## 2015-03-07 DIAGNOSIS — Z915 Personal history of self-harm: Secondary | ICD-10-CM | POA: Insufficient documentation

## 2015-03-07 DIAGNOSIS — F329 Major depressive disorder, single episode, unspecified: Secondary | ICD-10-CM | POA: Diagnosis not present

## 2015-03-07 DIAGNOSIS — R079 Chest pain, unspecified: Secondary | ICD-10-CM

## 2015-03-07 DIAGNOSIS — Y998 Other external cause status: Secondary | ICD-10-CM | POA: Insufficient documentation

## 2015-03-07 DIAGNOSIS — I10 Essential (primary) hypertension: Secondary | ICD-10-CM | POA: Insufficient documentation

## 2015-03-07 DIAGNOSIS — M542 Cervicalgia: Secondary | ICD-10-CM

## 2015-03-07 DIAGNOSIS — Z880 Allergy status to penicillin: Secondary | ICD-10-CM | POA: Diagnosis not present

## 2015-03-07 DIAGNOSIS — Y9289 Other specified places as the place of occurrence of the external cause: Secondary | ICD-10-CM | POA: Insufficient documentation

## 2015-03-07 DIAGNOSIS — S29001A Unspecified injury of muscle and tendon of front wall of thorax, initial encounter: Secondary | ICD-10-CM | POA: Diagnosis not present

## 2015-03-07 DIAGNOSIS — Z8742 Personal history of other diseases of the female genital tract: Secondary | ICD-10-CM | POA: Insufficient documentation

## 2015-03-07 DIAGNOSIS — W108XXA Fall (on) (from) other stairs and steps, initial encounter: Secondary | ICD-10-CM | POA: Insufficient documentation

## 2015-03-07 DIAGNOSIS — Z79899 Other long term (current) drug therapy: Secondary | ICD-10-CM | POA: Diagnosis not present

## 2015-03-07 DIAGNOSIS — S199XXA Unspecified injury of neck, initial encounter: Secondary | ICD-10-CM | POA: Diagnosis not present

## 2015-03-07 LAB — CBC WITH DIFFERENTIAL/PLATELET
BASOS PCT: 0 %
Basophils Absolute: 0 10*3/uL (ref 0.0–0.1)
EOS ABS: 0.4 10*3/uL (ref 0.0–0.7)
Eosinophils Relative: 5 %
HCT: 40.8 % (ref 36.0–46.0)
HEMOGLOBIN: 13.7 g/dL (ref 12.0–15.0)
Lymphocytes Relative: 24 %
Lymphs Abs: 2 10*3/uL (ref 0.7–4.0)
MCH: 29.9 pg (ref 26.0–34.0)
MCHC: 33.6 g/dL (ref 30.0–36.0)
MCV: 89.1 fL (ref 78.0–100.0)
MONOS PCT: 6 %
Monocytes Absolute: 0.5 10*3/uL (ref 0.1–1.0)
NEUTROS PCT: 65 %
Neutro Abs: 5.4 10*3/uL (ref 1.7–7.7)
Platelets: 241 10*3/uL (ref 150–400)
RBC: 4.58 MIL/uL (ref 3.87–5.11)
RDW: 13.1 % (ref 11.5–15.5)
WBC: 8.4 10*3/uL (ref 4.0–10.5)

## 2015-03-07 LAB — COMPREHENSIVE METABOLIC PANEL
ALK PHOS: 80 U/L (ref 38–126)
ALT: 25 U/L (ref 14–54)
AST: 28 U/L (ref 15–41)
Albumin: 4.2 g/dL (ref 3.5–5.0)
Anion gap: 12 (ref 5–15)
BILIRUBIN TOTAL: 0.4 mg/dL (ref 0.3–1.2)
BUN: 21 mg/dL — ABNORMAL HIGH (ref 6–20)
CALCIUM: 9.2 mg/dL (ref 8.9–10.3)
CO2: 28 mmol/L (ref 22–32)
CREATININE: 1.09 mg/dL — AB (ref 0.44–1.00)
Chloride: 102 mmol/L (ref 101–111)
GFR, EST NON AFRICAN AMERICAN: 53 mL/min — AB (ref >60)
Glucose, Bld: 126 mg/dL — ABNORMAL HIGH (ref 65–99)
Potassium: 3.7 mmol/L (ref 3.5–5.1)
Sodium: 142 mmol/L (ref 135–145)
TOTAL PROTEIN: 7.1 g/dL (ref 6.5–8.1)

## 2015-03-07 LAB — I-STAT TROPONIN, ED: TROPONIN I, POC: 0 ng/mL (ref 0.00–0.08)

## 2015-03-07 MED ORDER — OXYCODONE-ACETAMINOPHEN 5-325 MG PO TABS: 1.0000 | ORAL_TABLET | ORAL | Status: DC | PRN

## 2015-03-07 MED ORDER — METHOCARBAMOL 500 MG PO TABS: 500.0000 mg | ORAL_TABLET | Freq: Two times a day (BID) | ORAL | Status: DC

## 2015-03-07 MED ORDER — ONDANSETRON 4 MG PO TBDP
4.0000 mg | ORAL_TABLET | Freq: Once | ORAL | Status: AC
  Administered 2015-03-07: 4 mg via ORAL
  Filled 2015-03-07: qty 1

## 2015-03-07 MED ORDER — IOHEXOL 300 MG/ML  SOLN
100.0000 mL | Freq: Once | INTRAMUSCULAR | Status: AC | PRN
  Administered 2015-03-07: 100 mL via INTRAVENOUS

## 2015-03-07 MED ORDER — IBUPROFEN 800 MG PO TABS: 800.0000 mg | ORAL_TABLET | Freq: Three times a day (TID) | ORAL | Status: DC

## 2015-03-07 MED ORDER — GI COCKTAIL ~~LOC~~
30.0000 mL | Freq: Once | ORAL | Status: AC
  Administered 2015-03-07: 30 mL via ORAL
  Filled 2015-03-07: qty 30

## 2015-03-07 MED ORDER — METHOCARBAMOL 500 MG PO TABS
1000.0000 mg | ORAL_TABLET | Freq: Once | ORAL | Status: AC
  Administered 2015-03-07: 1000 mg via ORAL
  Filled 2015-03-07: qty 2

## 2015-03-07 MED ORDER — OXYCODONE-ACETAMINOPHEN 5-325 MG PO TABS
2.0000 | ORAL_TABLET | Freq: Once | ORAL | Status: AC
  Administered 2015-03-07: 2 via ORAL
  Filled 2015-03-07: qty 2

## 2015-03-07 MED ORDER — KETOROLAC TROMETHAMINE 60 MG/2ML IM SOLN
30.0000 mg | Freq: Once | INTRAMUSCULAR | Status: AC
  Administered 2015-03-07: 30 mg via INTRAMUSCULAR
  Filled 2015-03-07: qty 2

## 2015-03-07 MED ORDER — OXYCODONE-ACETAMINOPHEN 5-325 MG PO TABS
1.0000 | ORAL_TABLET | Freq: Once | ORAL | Status: AC
  Administered 2015-03-07: 1 via ORAL
  Filled 2015-03-07: qty 1

## 2015-03-07 MED ORDER — HYDROMORPHONE HCL 1 MG/ML IJ SOLN
1.0000 mg | Freq: Once | INTRAMUSCULAR | Status: AC
  Administered 2015-03-07: 1 mg via INTRAMUSCULAR
  Filled 2015-03-07: qty 1

## 2015-03-07 NOTE — ED Notes (Signed)
Patient complains of upper abdominal pain.  PA at the bedside.  Patient reports feeling nauseated.

## 2015-03-07 NOTE — ED Notes (Signed)
MD at bedside. 

## 2015-03-07 NOTE — ED Provider Notes (Signed)
2323 - Patient care assumed from [place], [place] at shift change. Patient pending CT chest/abd/pelvis to evaluate for lower chest/epigastric pain which began as patient was getting ready to be discharge. Pain has improved, per patient, but is starting to become more severe. Will add Percocet. Troponin negative. EKG stable compared to priors. CT scans are also reassuring; negative for acute findings. Suspect indigestion vs MSK etiology from fall. Patient stable for d/c with instructions for supportive care. Return precautions given at discharge.    Results for orders placed or performed during the hospital encounter of 03/07/15  Comprehensive metabolic panel  Result Value Ref Range   Sodium 142 135 - 145 mmol/L   Potassium 3.7 3.5 - 5.1 mmol/L   Chloride 102 101 - 111 mmol/L   CO2 28 22 - 32 mmol/L   Glucose, Bld 126 (H) 65 - 99 mg/dL   BUN 21 (H) 6 - 20 mg/dL   Creatinine, Ser 1.09 (H) 0.44 - 1.00 mg/dL   Calcium 9.2 8.9 - 10.3 mg/dL   Total Protein 7.1 6.5 - 8.1 g/dL   Albumin 4.2 3.5 - 5.0 g/dL   AST 28 15 - 41 U/L   ALT 25 14 - 54 U/L   Alkaline Phosphatase 80 38 - 126 U/L   Total Bilirubin 0.4 0.3 - 1.2 mg/dL   GFR calc non Af Amer 53 (L) >60 mL/min   GFR calc Af Amer >60 >60 mL/min   Anion gap 12 5 - 15  CBC with Differential  Result Value Ref Range   WBC 8.4 4.0 - 10.5 K/uL   RBC 4.58 3.87 - 5.11 MIL/uL   Hemoglobin 13.7 12.0 - 15.0 g/dL   HCT 40.8 36.0 - 46.0 %   MCV 89.1 78.0 - 100.0 fL   MCH 29.9 26.0 - 34.0 pg   MCHC 33.6 30.0 - 36.0 g/dL   RDW 13.1 11.5 - 15.5 %   Platelets 241 150 - 400 K/uL   Neutrophils Relative % 65 %   Neutro Abs 5.4 1.7 - 7.7 K/uL   Lymphocytes Relative 24 %   Lymphs Abs 2.0 0.7 - 4.0 K/uL   Monocytes Relative 6 %   Monocytes Absolute 0.5 0.1 - 1.0 K/uL   Eosinophils Relative 5 %   Eosinophils Absolute 0.4 0.0 - 0.7 K/uL   Basophils Relative 0 %   Basophils Absolute 0.0 0.0 - 0.1 K/uL  I-stat troponin, ED  Result Value Ref Range    Troponin i, poc 0.00 0.00 - 0.08 ng/mL   Comment 3           Dg Lumbar Spine Complete  03/07/2015  CLINICAL DATA:  65 year old female with left shoulder pain and left-sided lumbar spine pain after falling down 18 stairs today at home. EXAM: LUMBAR SPINE - COMPLETE 4+ VIEW COMPARISON:  Prior lumbar spine MRI 05/16/2007 FINDINGS: Surgical changes of prior L4-S1 posterior lumbar interbody fusion with bilateral pedicle screw and rod construct. There is successful bony ankylosis of both levels. Heterotopic ossification with productive bone growth about the right-sided L4 pedicle screw posteriorly. This likely represents hypertrophy of the facet. Unremarkable visualized bowel gas pattern. No lytic or blastic osseous lesion. IMPRESSION: 1. Surgical changes of prior L4-S1 posterior lumbar interbody fusion with bilateral pedicle screw and rod construct. Successful bony ankylosis of both levels. 2. Lower lumbar facet hypertrophy most significant on the right at L4 with bone growth around the pedicle screw and rod interface. Electronically Signed   By: Jacqulynn Cadet  M.D.   On: 03/07/2015 19:08   Ct Head Wo Contrast  03/07/2015  CLINICAL DATA:  Acute neck and left shoulder pain after falling down stairs today. EXAM: CT HEAD WITHOUT CONTRAST CT CERVICAL SPINE WITHOUT CONTRAST TECHNIQUE: Multidetector CT imaging of the head and cervical spine was performed following the standard protocol without intravenous contrast. Multiplanar CT image reconstructions of the cervical spine were also generated. COMPARISON:  CT scan of head of July 06, 2006. FINDINGS: CT HEAD FINDINGS Bony calvarium appears intact. No mass effect or midline shift is noted. Ventricular size is within normal limits. There is no evidence of mass lesion, hemorrhage or acute infarction. CT CERVICAL SPINE FINDINGS No fracture is noted. Grade 1 anterolisthesis of C5-6 is noted secondary to posterior facet joint hypertrophy. Mild degenerative disc disease is  noted at C6-7. Degenerative joint disease is seen involving multiple facet joints bilaterally. Visualized lung apices appear normal. IMPRESSION: Normal head CT. Mild degenerative changes as described above. No acute abnormality seen in the cervical spine. Electronically Signed   By: Marijo Conception, M.D.   On: 03/07/2015 19:19   Ct Chest W Contrast  03/07/2015  CLINICAL DATA:  65 year old female with upper abdominal pain and nausea EXAM: CT CHEST, ABDOMEN, AND PELVIS WITH CONTRAST TECHNIQUE: Multidetector CT imaging of the chest, abdomen and pelvis was performed following the standard protocol during bolus administration of intravenous contrast. CONTRAST:  129mL OMNIPAQUE IOHEXOL 300 MG/ML  SOLN COMPARISON:  Prior CT abdomen/ pelvis 05/22/2007; Prior adrenal protocol MRI 05/31/2007 FINDINGS: CT CHEST Mediastinum: Unremarkable CT appearance of the thyroid gland. No suspicious mediastinal or hilar adenopathy. No soft tissue mediastinal mass. The thoracic esophagus is unremarkable. Heart/Vascular: No evidence of aortic aneurysm or dissection. Conventional 3 vessel arch anatomy. No central pulmonary embolus. Calcifications along the left anterior descending coronary artery. Heart is normal in size. No pericardial effusion. Lungs/Pleura: The lungs are clear.  No pleural effusion. Bones/Soft Tissues: No acute fracture or aggressive appearing lytic or blastic osseous lesion. CT ABDOMEN AND PELVIS Abdomen: Unremarkable CT appearance of the stomach, duodenum, spleen, and right adrenal gland. 2.9 cm enhancing left adrenal nodule is slightly increased compared to 2.5 cm in May of 2009. This was previously characterized as a benign adenoma. The slight interval size change over more than 10 years is not unexpected. Normal hepatic contour and morphology. Geographic high attenuation in the posterior aspect of hepatic segment 3 is nonspecific but very likely a transient hepatic attenuation difference rather than true lesion. The  gallbladder is surgically absent. No intra or extrahepatic biliary ductal dilatation. Unremarkable appearance of the bilateral kidneys. No focal solid lesion, hydronephrosis or nephrolithiasis. No evidence of obstruction or focal bowel wall thickening. Normal appendix in the right lower quadrant. The terminal ileum is unremarkable. No free fluid or suspicious adenopathy. Pelvis: Surgical changes of prior hysterectomy. Pelvic floor laxity. Unremarkable bladder. No free fluid or adenopathy. Musculoskeletal: No acute fracture or aggressive appearing lytic or blastic osseous lesion. Prior L4-S1 posterior lumbar interbody fusion with bilateral pedicle screw and rod construct. Vascular: Atherosclerotic vascular disease without significant stenosis or aneurysmal dilatation. IMPRESSION: CT CHEST 1. No acute cardiopulmonary process. 2. Atherosclerotic calcifications along the left anterior descending coronary artery. Please note that although the presence of coronary artery calcium documents the presence of coronary artery disease, the severity of this disease and any potential stenosis cannot be assessed on this non-gated CT examination. Assessment for potential risk factor modification, dietary therapy or pharmacologic therapy may be warranted, if clinically  indicated. CT ABD/PELVIS 1. No acute abnormality in the abdomen or pelvis. 2. Left adrenal adenoma has minimally enlarged compared to May of 2009. 3. Pelvic floor laxity. 4. Prior L4-S1 PLIF. 5. Scattered mild atherosclerotic vascular calcifications. 6. Probable incidental and benign hepatic perfusion anomaly in the posterior aspect of segment 3. 7. Surgical changes of prior cholecystectomy and hysterectomy. Electronically Signed   By: Jacqulynn Cadet M.D.   On: 03/07/2015 22:55   Ct Cervical Spine Wo Contrast  03/07/2015  CLINICAL DATA:  Acute neck and left shoulder pain after falling down stairs today. EXAM: CT HEAD WITHOUT CONTRAST CT CERVICAL SPINE WITHOUT  CONTRAST TECHNIQUE: Multidetector CT imaging of the head and cervical spine was performed following the standard protocol without intravenous contrast. Multiplanar CT image reconstructions of the cervical spine were also generated. COMPARISON:  CT scan of head of July 06, 2006. FINDINGS: CT HEAD FINDINGS Bony calvarium appears intact. No mass effect or midline shift is noted. Ventricular size is within normal limits. There is no evidence of mass lesion, hemorrhage or acute infarction. CT CERVICAL SPINE FINDINGS No fracture is noted. Grade 1 anterolisthesis of C5-6 is noted secondary to posterior facet joint hypertrophy. Mild degenerative disc disease is noted at C6-7. Degenerative joint disease is seen involving multiple facet joints bilaterally. Visualized lung apices appear normal. IMPRESSION: Normal head CT. Mild degenerative changes as described above. No acute abnormality seen in the cervical spine. Electronically Signed   By: Marijo Conception, M.D.   On: 03/07/2015 19:19   Ct Abdomen Pelvis W Contrast  03/07/2015  CLINICAL DATA:  65 year old female with upper abdominal pain and nausea EXAM: CT CHEST, ABDOMEN, AND PELVIS WITH CONTRAST TECHNIQUE: Multidetector CT imaging of the chest, abdomen and pelvis was performed following the standard protocol during bolus administration of intravenous contrast. CONTRAST:  158mL OMNIPAQUE IOHEXOL 300 MG/ML  SOLN COMPARISON:  Prior CT abdomen/ pelvis 05/22/2007; Prior adrenal protocol MRI 05/31/2007 FINDINGS: CT CHEST Mediastinum: Unremarkable CT appearance of the thyroid gland. No suspicious mediastinal or hilar adenopathy. No soft tissue mediastinal mass. The thoracic esophagus is unremarkable. Heart/Vascular: No evidence of aortic aneurysm or dissection. Conventional 3 vessel arch anatomy. No central pulmonary embolus. Calcifications along the left anterior descending coronary artery. Heart is normal in size. No pericardial effusion. Lungs/Pleura: The lungs are clear.   No pleural effusion. Bones/Soft Tissues: No acute fracture or aggressive appearing lytic or blastic osseous lesion. CT ABDOMEN AND PELVIS Abdomen: Unremarkable CT appearance of the stomach, duodenum, spleen, and right adrenal gland. 2.9 cm enhancing left adrenal nodule is slightly increased compared to 2.5 cm in May of 2009. This was previously characterized as a benign adenoma. The slight interval size change over more than 10 years is not unexpected. Normal hepatic contour and morphology. Geographic high attenuation in the posterior aspect of hepatic segment 3 is nonspecific but very likely a transient hepatic attenuation difference rather than true lesion. The gallbladder is surgically absent. No intra or extrahepatic biliary ductal dilatation. Unremarkable appearance of the bilateral kidneys. No focal solid lesion, hydronephrosis or nephrolithiasis. No evidence of obstruction or focal bowel wall thickening. Normal appendix in the right lower quadrant. The terminal ileum is unremarkable. No free fluid or suspicious adenopathy. Pelvis: Surgical changes of prior hysterectomy. Pelvic floor laxity. Unremarkable bladder. No free fluid or adenopathy. Musculoskeletal: No acute fracture or aggressive appearing lytic or blastic osseous lesion. Prior L4-S1 posterior lumbar interbody fusion with bilateral pedicle screw and rod construct. Vascular: Atherosclerotic vascular disease without significant stenosis  or aneurysmal dilatation. IMPRESSION: CT CHEST 1. No acute cardiopulmonary process. 2. Atherosclerotic calcifications along the left anterior descending coronary artery. Please note that although the presence of coronary artery calcium documents the presence of coronary artery disease, the severity of this disease and any potential stenosis cannot be assessed on this non-gated CT examination. Assessment for potential risk factor modification, dietary therapy or pharmacologic therapy may be warranted, if clinically  indicated. CT ABD/PELVIS 1. No acute abnormality in the abdomen or pelvis. 2. Left adrenal adenoma has minimally enlarged compared to May of 2009. 3. Pelvic floor laxity. 4. Prior L4-S1 PLIF. 5. Scattered mild atherosclerotic vascular calcifications. 6. Probable incidental and benign hepatic perfusion anomaly in the posterior aspect of segment 3. 7. Surgical changes of prior cholecystectomy and hysterectomy. Electronically Signed   By: Jacqulynn Cadet M.D.   On: 03/07/2015 22:55   Dg Shoulder Left  03/07/2015  CLINICAL DATA:  Left shoulder pain.  Fall down steps today. EXAM: LEFT SHOULDER - 2+ VIEW COMPARISON:  None. FINDINGS: There is no evidence of fracture or dislocation. Soft tissues are unremarkable. IMPRESSION: Negative. Electronically Signed   By: Monte Fantasia M.D.   On: 03/07/2015 19:07     EKG Interpretation  Date/Time:  Thursday March 07 2015 22:00:21 EST Ventricular Rate:  56 PR Interval:  162 QRS Duration: 108 QT Interval:  468 QTC Calculation: 451 R Axis:   -35 Text Interpretation:  Sinus bradycardia Left axis deviation Abnormal ECG No significant change since last tracing Confirmed by KNAPP  MD-J, JON ([id number]) on 03/07/2015 10:14:03 PM          Antonietta Breach, PA-C 03/07/15 [id number]  Varney Biles, MD 03/08/15 0020

## 2015-03-07 NOTE — Discharge Instructions (Signed)
You have been seen today for evaluation following a fall. Your imaging showed no abnormalities. Follow up with orthopedics should symptoms persist. Follow up with PCP as needed. Return to ED should symptoms worsen.   Our imaging is normal in the ER. We think you are having a cervical sprain/spasms, however, to be absolutely sure we are not missing a significant ligament injury - we are sending you home with a cervical collar. Keep the collar on until the pain ceases, at which point you can take the collar off. If the symptoms get worse, you start having numbness, tingling, weakness in your arms or hands, return to the ER right away. If the symptoms don't improve in 1 week, call the primary care doctor for a followup.

## 2015-03-07 NOTE — ED Provider Notes (Signed)
CSN: [id number]     Arrival date & time 03/07/15  1739 History  By signing my name below, I, Sarah [name], attest that this documentation has been prepared under the direction and in the presence of Aneliese Beaudry, PA-C. Electronically Signed: Jolayne [name], Scribe. 03/07/2015. 6:26 PM.   Chief Complaint  Patient presents with  . Fall   The history is provided by the patient. No language interpreter was used.   HPI Comments: Sarah [name] is a 65 y.o. female with a hx of two back surgeries, who presents to the Emergency Department complaining of sudden onset, constant, moderate, left-sided neck pain with radiation down the left side of her back, after falling down 18 steps about two hours ago while trying to carry her dog down the stairs. Pt also reports associated nausea and left shoulder pain with movement. She also states that she received a cortisone shot in her left shoulder two weeks ago as well, for treatment of possible degenerative neck discs. She denies onset of dizziness, chest pain and SOB prior to the fall and currently denies chest pain, jaw pain, dysuria, head pain, and bowel and bladder incontinence. Pt does not take anticoagulants or aspirin.   Past Medical History  Diagnosis Date  . Hypertension   . Depression   . Anxiety   . Diabetes mellitus   . Hyperlipidemia   . Dependent edema   . Insomnia   . PMS (premenstrual syndrome)   . Dysmenorrhea   . Constipation   . H/O: suicide attempt    Past Surgical History  Procedure Laterality Date  . Abdominal hysterectomy    . Cholecystectomy    . Lumbar fusion    . Tubal ligation    . Dilation and curettage of uterus     Family History  Problem Relation Age of Onset  . Diabetes Mother   . Mental illness Mother    Social History  Substance Use Topics  . Smoking status: Never Smoker   . Smokeless tobacco: None  . Alcohol Use: None   OB History    No data available     Review of Systems  Respiratory:  Negative for shortness of breath.   Cardiovascular: Negative for chest pain.  Gastrointestinal: Positive for nausea.  Genitourinary: Negative for dysuria.  Musculoskeletal: Positive for myalgias, back pain, arthralgias and neck pain.  Neurological: Negative for dizziness and headaches.    Allergies  Penicillins  Home Medications   Prior to Admission medications   Medication Sig Start Date End Date Taking? Authorizing Provider  buPROPion [facility]) 100 MG tablet  10/31/11   Historical Provider, MD  clonazePAM (KLONOPIN) 1 MG tablet  08/24/13   Historical Provider, MD  cloNIDine (CATAPRES) 0.1 MG tablet TAKE 1 TABLET BY MOUTH TWICE A DAY 02/21/15   Elby Showers, MD  cyclobenzaprine (FLEXERIL) 10 MG tablet One half to one tablet by mouth daily at bedtime when necessary neck pain 09/20/14   Elby Showers, MD  escitalopram (LEXAPRO) 20 MG tablet Take 20 mg by mouth daily.    Historical Provider, MD  etodolac (LODINE) 400 MG tablet  02/06/14   Historical Provider, MD  furosemide (LASIX) 40 MG tablet TAKE 1 TABLET BY MOUTH TWICE A DAY 10/10/14   Elby Showers, MD  gabapentin (NEURONTIN) 300 MG capsule Take 2 po tid for peripheral neuropathy 02/16/13   Elby Showers, MD  ibuprofen (ADVIL,MOTRIN) 800 MG tablet Take 1 tablet (800 mg total) by mouth 3 (three)  times daily. 03/07/15   Leanda Padmore C Stanisha Lorenz, PA-C  LORazepam (ATIVAN) 1 MG tablet Take 1 mg by mouth 3 (three) times daily as needed.      Historical Provider, MD  losartan (COZAAR) 50 MG tablet TAKE 1 TABLET BY MOUTH DAILY 11/05/14   Elby Showers, MD  methocarbamol (ROBAXIN) 500 MG tablet Take 1 tablet (500 mg total) by mouth 2 (two) times daily. 03/07/15   Capri Raben C Chantea Surace, PA-C  oxyCODONE-acetaminophen (PERCOCET/ROXICET) 5-325 MG tablet Take 1-2 tablets by mouth every 4 (four) hours as needed for severe pain. 03/07/15   Christon Parada C Reshonda Koerber, PA-C  potassium chloride SA (K-DUR,KLOR-CON) 20 MEQ tablet Take 20 mEq by mouth 2 (two) times daily.      Historical Provider, MD   simvastatin (ZOCOR) 10 MG tablet TAKE ONE TABLET BY MOUTH EVERY NIGHT AT BEDTIME 11/10/13   Elby Showers, MD  zolpidem (AMBIEN) 10 MG tablet Take 10 mg by mouth at bedtime as needed.      Historical Provider, MD   BP 185/91 mmHg  Pulse 65  Temp(Src) 97.7 F (36.5 C) (Oral)  Resp 15  SpO2 95% Physical Exam  Constitutional: She is oriented to person, place, and time. She appears well-developed and well-nourished. No distress.  HENT:  Head: Normocephalic and atraumatic.  Eyes: Conjunctivae and EOM are normal. Pupils are equal, round, and reactive to light.  Neck: Normal range of motion. Neck supple.  Cardiovascular: Normal rate, regular rhythm and normal heart sounds.   Pulmonary/Chest: Effort normal and breath sounds normal.  Abdominal: Soft. She exhibits no distension. There is no tenderness.  Musculoskeletal: She exhibits no edema or tenderness.  Tenderness to left musculature of the neck. Tenderness to the lumbar musculature. No paraspinal tenderness. Full ROM in all extremities and spine.   Neurological: She is alert and oriented to person, place, and time. She has normal reflexes.  No sensory deficits. Strength 5/5 in all extremities. No gait disturbance. Coordination intact. Cranial nerves III-XII grossly intact. No facial droop.   Skin: Skin is warm and dry.  Psychiatric: She has a normal mood and affect.  Nursing note and vitals reviewed.   ED Course  Procedures  DIAGNOSTIC STUDIES:    Oxygen Saturation is 95% on RA, adequate by my interpretation.   COORDINATION OF CARE:  6:04 PM Will order CT scan of pt's neck and head. Will order x ray of lower back. Discussed treatment plan with pt at bedside and pt agreed to plan.   Imaging Review Dg Lumbar Spine Complete  03/07/2015  CLINICAL DATA:  65 year old female with left shoulder pain and left-sided lumbar spine pain after falling down 18 stairs today at home. EXAM: LUMBAR SPINE - COMPLETE 4+ VIEW COMPARISON:  Prior lumbar  spine MRI 05/16/2007 FINDINGS: Surgical changes of prior L4-S1 posterior lumbar interbody fusion with bilateral pedicle screw and rod construct. There is successful bony ankylosis of both levels. Heterotopic ossification with productive bone growth about the right-sided L4 pedicle screw posteriorly. This likely represents hypertrophy of the facet. Unremarkable visualized bowel gas pattern. No lytic or blastic osseous lesion. IMPRESSION: 1. Surgical changes of prior L4-S1 posterior lumbar interbody fusion with bilateral pedicle screw and rod construct. Successful bony ankylosis of both levels. 2. Lower lumbar facet hypertrophy most significant on the right at L4 with bone growth around the pedicle screw and rod interface. Electronically Signed   By: Jacqulynn Cadet M.D.   On: 03/07/2015 19:08   Ct Head Wo Contrast  03/07/2015  CLINICAL DATA:  Acute neck and left shoulder pain after falling down stairs today. EXAM: CT HEAD WITHOUT CONTRAST CT CERVICAL SPINE WITHOUT CONTRAST TECHNIQUE: Multidetector CT imaging of the head and cervical spine was performed following the standard protocol without intravenous contrast. Multiplanar CT image reconstructions of the cervical spine were also generated. COMPARISON:  CT scan of head of July 06, 2006. FINDINGS: CT HEAD FINDINGS Bony calvarium appears intact. No mass effect or midline shift is noted. Ventricular size is within normal limits. There is no evidence of mass lesion, hemorrhage or acute infarction. CT CERVICAL SPINE FINDINGS No fracture is noted. Grade 1 anterolisthesis of C5-6 is noted secondary to posterior facet joint hypertrophy. Mild degenerative disc disease is noted at C6-7. Degenerative joint disease is seen involving multiple facet joints bilaterally. Visualized lung apices appear normal. IMPRESSION: Normal head CT. Mild degenerative changes as described above. No acute abnormality seen in the cervical spine. Electronically Signed   By: Marijo Conception,  M.D.   On: 03/07/2015 19:19   Ct Cervical Spine Wo Contrast  03/07/2015  CLINICAL DATA:  Acute neck and left shoulder pain after falling down stairs today. EXAM: CT HEAD WITHOUT CONTRAST CT CERVICAL SPINE WITHOUT CONTRAST TECHNIQUE: Multidetector CT imaging of the head and cervical spine was performed following the standard protocol without intravenous contrast. Multiplanar CT image reconstructions of the cervical spine were also generated. COMPARISON:  CT scan of head of July 06, 2006. FINDINGS: CT HEAD FINDINGS Bony calvarium appears intact. No mass effect or midline shift is noted. Ventricular size is within normal limits. There is no evidence of mass lesion, hemorrhage or acute infarction. CT CERVICAL SPINE FINDINGS No fracture is noted. Grade 1 anterolisthesis of C5-6 is noted secondary to posterior facet joint hypertrophy. Mild degenerative disc disease is noted at C6-7. Degenerative joint disease is seen involving multiple facet joints bilaterally. Visualized lung apices appear normal. IMPRESSION: Normal head CT. Mild degenerative changes as described above. No acute abnormality seen in the cervical spine. Electronically Signed   By: Marijo Conception, M.D.   On: 03/07/2015 19:19   Dg Shoulder Left  03/07/2015  CLINICAL DATA:  Left shoulder pain.  Fall down steps today. EXAM: LEFT SHOULDER - 2+ VIEW COMPARISON:  None. FINDINGS: There is no evidence of fracture or dislocation. Soft tissues are unremarkable. IMPRESSION: Negative. Electronically Signed   By: Monte Fantasia M.D.   On: 03/07/2015 19:07   I have personally reviewed and evaluated these images as part of my medical decision-making.   MDM   Final diagnoses:  Fall (on) (from) other stairs and steps, initial encounter  Neck pain  Left-sided low back pain without sciatica    Gertrude Claywell presents for evaluation following a fall. Pt complains of neck, back and left shoulder pain.   Findings and plan of care discussed with Varney Biles, MD. Dr. Kathrynn Humble personally assessed and examined this patient.   Suspect that the patient's symptoms are stemming from MSK injury. Patient is well-appearing, alert and oriented, has a normal neuro exam, and is in no apparent distress. Pain management and pertinent imaging ordered. 7:19 PM Patient states that her pain has not improved despite Toradol and 2 Percocet. Patient's pain improved after 1 mg of Dilaudid. Pt began to have midline c-spine tenderness upon re-exam, despite no acute abnormalities on CT. Pt was placed in a soft C-collar and advised to follow up with orthopedics within the next week.  9:06 PM Pt has now began to complain  of epigastric pain. Pt states, "It feels like indigestion, but I don't know." Pt states she has had this pain before and it tends to go away on its own. Pt adds, "I had some chili before I fell. Maybe that is giving me indigestion." Pt localizes the pain to the epigastric region, rates it 8/10, describes it as a burning, and it radiates up into her chest. Zofran and a GI cocktail were ordered.  9:25 PM End of shift patient care handoff report given to Antonietta Breach, PA-C. Plan: Follow up on patient's epigastric pain and discharge with previously laid out plan.  9:42 PM Spoke with Dr. Kathrynn Humble, who was updated on the patient's new complaint. Dr. Kathrynn Humble and I agreed that the patient should be further evaluated due to her mechanism of injury. A chest and abdominal CT was ordered as well as an EKG and labs. These assessment tools were pending upon the end of my shift. Antonietta Breach was updated with this new development. The charge nurse, Karna Christmas, was also updated on the patient's status as well as the need to move the patient to the acute side of the ED.  I personally performed the services described in this documentation, which was scribed in my presence. The recorded information has been reviewed and is accurate.   Lorayne Bender, PA-C 03/08/15 [place], MD 03/09/15 [phone]

## 2015-03-07 NOTE — ED Notes (Signed)
Pt states she was walking down stairs and fell.  C/o neck and back pain

## 2015-03-07 NOTE — ED Notes (Addendum)
Patient transported to X-ray 

## 2015-03-21 ENCOUNTER — Encounter: Payer: Medicare Other | Admitting: Internal Medicine

## 2015-03-25 ENCOUNTER — Telehealth: Payer: Self-pay | Admitting: Internal Medicine

## 2015-03-25 NOTE — Telephone Encounter (Signed)
Patient fell 2 weeks ago down a flight of 18 stairs.  She's being seen and followed by Dr. Vickki Hearing [facility]) for this injury.  She is going to put off her CPE until this injury has healed.  She'll call back to reschedule at that time.  Notified Dr. Renold Genta to make her aware of this information (verbally).

## 2015-05-06 ENCOUNTER — Other Ambulatory Visit: Payer: Self-pay | Admitting: Internal Medicine

## 2015-05-23 ENCOUNTER — Other Ambulatory Visit: Payer: Self-pay | Admitting: Internal Medicine

## 2015-05-23 ENCOUNTER — Other Ambulatory Visit: Payer: Medicare Other | Admitting: Internal Medicine

## 2015-05-23 DIAGNOSIS — Z1329 Encounter for screening for other suspected endocrine disorder: Secondary | ICD-10-CM

## 2015-05-23 DIAGNOSIS — E559 Vitamin D deficiency, unspecified: Secondary | ICD-10-CM

## 2015-05-23 DIAGNOSIS — Z130 Encounter for screening for diseases of the blood and blood-forming organs and certain disorders involving the immune mechanism: Secondary | ICD-10-CM

## 2015-05-23 DIAGNOSIS — E785 Hyperlipidemia, unspecified: Secondary | ICD-10-CM

## 2015-05-23 DIAGNOSIS — I10 Essential (primary) hypertension: Secondary | ICD-10-CM

## 2015-05-23 DIAGNOSIS — Z0000 Encounter for general adult medical examination without abnormal findings: Secondary | ICD-10-CM

## 2015-05-23 DIAGNOSIS — Z79899 Other long term (current) drug therapy: Secondary | ICD-10-CM

## 2015-05-23 LAB — CBC WITH DIFFERENTIAL/PLATELET
BASOS PCT: 0 %
Basophils Absolute: 0 cells/uL (ref 0–200)
EOS PCT: 6 %
Eosinophils Absolute: 432 cells/uL (ref 15–500)
HCT: 39.1 % (ref 35.0–45.0)
Hemoglobin: 13.1 g/dL (ref 11.7–15.5)
LYMPHS PCT: 18 %
Lymphs Abs: 1296 cells/uL (ref 850–3900)
MCH: 30.3 pg (ref 27.0–33.0)
MCHC: 33.5 g/dL (ref 32.0–36.0)
MCV: 90.3 fL (ref 80.0–100.0)
MONO ABS: 504 {cells}/uL (ref 200–950)
MONOS PCT: 7 %
MPV: 10.5 fL (ref 7.5–12.5)
Neutro Abs: 4968 cells/uL (ref 1500–7800)
Neutrophils Relative %: 69 %
PLATELETS: 260 10*3/uL (ref 140–400)
RBC: 4.33 MIL/uL (ref 3.80–5.10)
RDW: 13.9 % (ref 11.0–15.0)
WBC: 7.2 10*3/uL (ref 3.8–10.8)

## 2015-05-23 LAB — COMPLETE METABOLIC PANEL WITH GFR
ALT: 14 U/L (ref 6–29)
AST: 11 U/L (ref 10–35)
Albumin: 3.8 g/dL (ref 3.6–5.1)
Alkaline Phosphatase: 83 U/L (ref 33–130)
BILIRUBIN TOTAL: 0.6 mg/dL (ref 0.2–1.2)
BUN: 13 mg/dL (ref 7–25)
CO2: 27 mmol/L (ref 20–31)
CREATININE: 0.83 mg/dL (ref 0.50–0.99)
Calcium: 8.9 mg/dL (ref 8.6–10.4)
Chloride: 101 mmol/L (ref 98–110)
GFR, EST NON AFRICAN AMERICAN: 75 mL/min (ref >=60)
GFR, Est African American: 87 mL/min (ref >=60)
GLUCOSE: 137 mg/dL — AB (ref 65–99)
Potassium: 4 mmol/L (ref 3.5–5.3)
SODIUM: 140 mmol/L (ref 135–146)
TOTAL PROTEIN: 5.8 g/dL — AB (ref 6.1–8.1)

## 2015-05-23 LAB — LIPID PANEL
Cholesterol: 259 mg/dL — ABNORMAL HIGH (ref 125–200)
HDL: 46 mg/dL (ref >=46)
LDL CALC: 182 mg/dL — AB (ref <130)
Total CHOL/HDL Ratio: 5.6 Ratio — ABNORMAL HIGH (ref <=5.0)
Triglycerides: 157 mg/dL — ABNORMAL HIGH (ref <150)
VLDL: 31 mg/dL — ABNORMAL HIGH (ref <30)

## 2015-05-23 LAB — TSH: TSH: 1.68 m[IU]/L

## 2015-05-24 ENCOUNTER — Encounter: Payer: Self-pay | Admitting: Internal Medicine

## 2015-05-24 ENCOUNTER — Ambulatory Visit (INDEPENDENT_AMBULATORY_CARE_PROVIDER_SITE_OTHER): Payer: Medicare Other | Admitting: Internal Medicine

## 2015-05-24 VITALS — BP 106/60 | HR 76 | Temp 98.0°F | Resp 18 | Ht 65.5 in | Wt 214.0 lb

## 2015-05-24 DIAGNOSIS — Z23 Encounter for immunization: Secondary | ICD-10-CM | POA: Diagnosis not present

## 2015-05-24 DIAGNOSIS — M542 Cervicalgia: Secondary | ICD-10-CM | POA: Diagnosis not present

## 2015-05-24 DIAGNOSIS — E119 Type 2 diabetes mellitus without complications: Secondary | ICD-10-CM | POA: Diagnosis not present

## 2015-05-24 DIAGNOSIS — E559 Vitamin D deficiency, unspecified: Secondary | ICD-10-CM

## 2015-05-24 DIAGNOSIS — I10 Essential (primary) hypertension: Secondary | ICD-10-CM

## 2015-05-24 DIAGNOSIS — Z0000 Encounter for general adult medical examination without abnormal findings: Secondary | ICD-10-CM

## 2015-05-24 DIAGNOSIS — E669 Obesity, unspecified: Secondary | ICD-10-CM | POA: Diagnosis not present

## 2015-05-24 DIAGNOSIS — E785 Hyperlipidemia, unspecified: Secondary | ICD-10-CM | POA: Diagnosis not present

## 2015-05-24 DIAGNOSIS — Z8659 Personal history of other mental and behavioral disorders: Secondary | ICD-10-CM

## 2015-05-24 DIAGNOSIS — R609 Edema, unspecified: Secondary | ICD-10-CM | POA: Diagnosis not present

## 2015-05-24 LAB — POCT URINALYSIS DIPSTICK
BILIRUBIN UA: NEGATIVE
GLUCOSE UA: NEGATIVE
KETONES UA: NEGATIVE
Leukocytes, UA: NEGATIVE
Nitrite, UA: NEGATIVE
Protein, UA: NEGATIVE
RBC UA: NEGATIVE
SPEC GRAV UA: 1.015
Urobilinogen, UA: 0.2
pH, UA: 7.5

## 2015-05-24 LAB — VITAMIN D 25 HYDROXY (VIT D DEFICIENCY, FRACTURES): Vit D, 25-Hydroxy: 15 ng/mL — ABNORMAL LOW (ref 30–100)

## 2015-05-24 LAB — HEMOGLOBIN A1C
HEMOGLOBIN A1C: 6.4 % — AB (ref <5.7)
Mean Plasma Glucose: 137 mg/dL

## 2015-05-24 MED ORDER — ERGOCALCIFEROL 1.25 MG (50000 UT) PO CAPS: ORAL_CAPSULE | ORAL | Status: DC

## 2015-05-24 NOTE — Progress Notes (Signed)
Subjective:    Patient ID: Sarah [name], female    DOB: Mar 02, 1950, 65 y.o.   MRN: [id number]  HPI 65 year old Female receives disability benefits for health maintenance exam and evaluation of medical issues. She has a long-standing history of back issues and issues of significant depression for a number of years. History of type 2 diabetes mellitus, hypertension, obesity, hyperlipidemia, dependent edema, hypokalemia secondary to diuretic therapy, metabolic syndrome. Has had multiple suicide attempts by overdose. Last attempt was August 2012. Currently followed in Dr. Arvil Persons office for major depressive disorder.  Hospitalized April 2011 with drug overdose including clonazepam, Ambien, gabapentin. Has been depressed since her husband died a couple occasions of pancreatic cancer in 2002. In July 2003 she overdosed on Ambien, Restoril, Seroquel. In 2004 she was hospitalized for 3 days at [facility].  Patient does not smoke or consume alcohol  She is allergic to penicillin  Was hospitalized for chest pain at [facility] where MI was ruled out a few years ago.  Bilateral tubal ligation 1982, D&C 1989, laparoscopic cholecystectomy 1994, lumbar fusion 2003, hysterectomy with BSO 2005, fractured left elbow 2006  Social history: She has an adult daughter and one grandson. Patient remarried after her husband died but that marriage did not work out. She is now divorced and resides alone. Formerly worked for [employer] She slipped on some steps in 2000 and began to have severe back problems which subsequently resulted in her obtaining disability. She was under the care of Dr. Hardin Negus for pain management for a long time. She did get some better after Dr. Ellene Route did lumbar fusion in 2003.  Family history: Mother with history of bipolar disorder and history of CABG still living. Some dementia issues with mother. Father died with lung  cancer but was a nonsmoker. One brother age 58 in good health. One sister.       Review of Systems history of numbness in feet for which she has taken gabapentin. New issue today is left-sided neck pain likely musculoskeletal pain/spasm     Objective:   Physical Exam  Constitutional: She is oriented to person, place, and time. She appears well-developed and well-nourished. No distress.  HENT:  Head: Normocephalic and atraumatic.  Right Ear: External ear normal.  Left Ear: External ear normal.  Mouth/Throat: Oropharynx is clear and moist. No oropharyngeal exudate.  Eyes: Conjunctivae and EOM are normal. Pupils are equal, round, and reactive to light. Right eye exhibits no discharge. Left eye exhibits no discharge. No scleral icterus.  Neck: Neck supple. No JVD present. No thyromegaly present.  Cardiovascular: Normal rate, regular rhythm, normal heart sounds and intact distal pulses.   No murmur heard. Pulmonary/Chest: Effort normal and breath sounds normal. She has no wheezes. She has no rales.  Breasts normal female  Abdominal: She exhibits no distension and no mass. There is no tenderness. There is no rebound and no guarding.  Genitourinary:  Deferred status post hysterectomy BSO  Musculoskeletal: Normal range of motion. She exhibits no edema.  Lymphadenopathy:    She has no cervical adenopathy.  Neurological: She is alert and oriented to person, place, and time. She has normal reflexes. No cranial nerve deficit. Coordination normal.  Skin: Skin is warm and dry. No rash noted. She is not diaphoretic.  Psychiatric: She has a normal mood and affect. Her behavior is normal. Judgment and thought content normal.  Vitals reviewed.         Assessment &  Plan:  Normal health maintenance exam  Controlled type 2 diabetes mellitus-hemoglobin A1c increased from 5.9% to 6.4%. Needs to diet and exercise.  Essential hypertension-excellent on current regimen  Hyperlipidemia-total  cholesterol 259, triglycerides 157, LDL cholesterol 182. Previously these IUs were normal 6 months ago. Exercise taking her medication and working on diet.Reminded to take lipid medication daily  History of depression-mildly depressed over daughter's sickness  Metabolic syndrome  Dependent edema-no dependent edema at present time  Hypokalemia secondary to diuretic therapy on potassium replacement-potassium normal today  Peripheral neuropathy  Neck pain-musculoskeletal. She has muscle relaxants. Apply ice for 20 minutes a couple of times daily. Consider physical therapy.  Vitamin D deficiency-50,000 units Drisdol weekly for 12 weeks then take 2000 units vitamin D 3 daily  Plan: Continue same medications and return in 3 months. Continue to work on diet and exercise. Make sure you are taking her medications regularly.  Subjective:   Patient presents for Medicare Annual/Subsequent preventive examination.  Review Past Medical/Family/Social:See above   Risk Factors  Current exercise habits: Mostly sedentary Dietary issues discussed: Low fat low carbohydrate recommended  Cardiac risk factors:Hypertension, hyperlipidemia, diabetes mellitus, family history of CABG in mother  Depression Screen  (Note: if answer to either of the following is "Yes", a more complete depression screening is indicated)   Over the past two weeks, have you felt down, depressed or hopeless? Yes due to daughter having possible surgery in [place] in July Over the past two weeks, have you felt little interest or pleasure in doing things? No Have you lost interest or pleasure in daily life? No Do you often feel hopeless? No Do you cry easily over simple problems? No   Activities of Daily Living  In your present state of health, do you have any difficulty performing the following activities?:   Driving? No  Managing money? No  Feeding yourself? No  Getting from bed to chair? No  Climbing a flight of  stairs? No  Preparing food and eating?: No  Bathing or showering? No  Getting dressed: No  Getting to the toilet? No  Using the toilet:No  Moving around from place to place: No  In the past year have you fallen or had a near fall?:Yes-fell down 18 steps at home Are you sexually active? No  Do you have more than one partner? No   Hearing Difficulties: No  Do you often ask people to speak up or repeat themselves? No  Do you experience ringing or noises in your ears? No  Do you have difficulty understanding soft or whispered voices? No  Do you feel that you have a problem with memory? No Do you often misplace items? No    Home Safety:  Do you have a smoke alarm at your residence? Yes Do you have grab bars in the bathroom?No Do you have throw rugs in your house? Yes   Cognitive Testing  Alert? Yes Normal Appearance?Yes  Oriented to person? Yes Place? Yes  Time? Yes  Recall of three objects? Yes  Can perform simple calculations? Yes  Displays appropriate judgment?Yes  Can read the correct time from a watch face?Yes   List the Names of Other Physician/Practitioners you currently use:  See referral list for the physicians patient is currently seeing.  See above   Review of Systems: As above   Objective:     General appearance: Appears stated age and obese  Head: Normocephalic, without obvious abnormality, atraumatic  Eyes: conj clear, EOMi PEERLA  Ears: normal TM's and external ear canals both ears  Nose: Nares normal. Septum midline. Mucosa normal. No drainage or sinus tenderness.  Throat: lips, mucosa, and tongue normal; teeth and gums normal  Neck: no adenopathy, no carotid bruit, no JVD, supple, symmetrical, trachea midline and thyroid not enlarged, symmetric, no tenderness/mass/nodules  No CVA tenderness.  Lungs: clear to auscultation bilaterally  Breasts: normal appearance, no masses or tenderness,  Heart: regular rate and rhythm, S1, S2 normal, no murmur, click,  rub or gallop  Abdomen: soft, non-tender; bowel sounds normal; no masses, no organomegaly  Musculoskeletal: ROM normal in all joints, no crepitus, no deformity, Normal muscle strengthen. Back  is symmetric, no curvature. Skin: Skin color, texture, turgor normal. No rashes or lesions  Lymph nodes: Cervical, supraclavicular, and axillary nodes normal.  Neurologic: CN 2 -12 Normal, Normal symmetric reflexes. Normal coordination and gait  Psych: Alert & Oriented x 3, Mood appear stable.    Assessment:    Annual wellness medicare exam   Plan:    During the course of the visit the patient was educated and counseled about appropriate screening and preventive services including:   Annual mammogram  Annual flu vaccine     Patient Instructions (the written plan) was given to the patient.  Medicare Attestation  I have personally reviewed:  The patient's medical and social history  Their use of alcohol, tobacco or illicit drugs  Their current medications and supplements  The patient's functional ability including ADLs,fall risks, home safety risks, cognitive, and hearing and visual impairment  Diet and physical activities  Evidence for depression or mood disorders  The patient's weight, height, BMI, and visual acuity have been recorded in the chart. I have made referrals, counseling, and provided education to the patient based on review of the above and I have provided the patient with a written personalized care plan for preventive services.

## 2015-05-24 NOTE — Patient Instructions (Signed)
Take 50,000 units weekly of Drisdol for 12 weeks in 2000 units vitamin D 3 daily. Have added hemoglobin A1c today to lab work. Return in 3 months for fasting lipid panel and liver functions without office visit. Make sure you are taking statin medication on a daily basis. Try to diet exercise and lose weight. It was a pleasure to see you today. Prevnar given.

## 2015-05-25 LAB — MICROALBUMIN, URINE

## 2015-05-27 ENCOUNTER — Other Ambulatory Visit: Payer: Self-pay

## 2015-05-27 MED ORDER — CLONIDINE HCL 0.1 MG PO TABS: 0.1000 mg | ORAL_TABLET | Freq: Two times a day (BID) | ORAL | Status: DC

## 2015-05-27 MED ORDER — LOSARTAN POTASSIUM 50 MG PO TABS: 50.0000 mg | ORAL_TABLET | Freq: Every day | ORAL | Status: DC

## 2015-08-13 ENCOUNTER — Other Ambulatory Visit: Payer: Self-pay | Admitting: Internal Medicine

## 2015-08-20 ENCOUNTER — Other Ambulatory Visit: Payer: Medicare Other | Admitting: Internal Medicine

## 2015-08-20 DIAGNOSIS — I10 Essential (primary) hypertension: Secondary | ICD-10-CM

## 2015-08-20 DIAGNOSIS — E785 Hyperlipidemia, unspecified: Secondary | ICD-10-CM

## 2015-08-20 LAB — HEPATIC FUNCTION PANEL
ALBUMIN: 3.8 g/dL (ref 3.6–5.1)
ALK PHOS: 92 U/L (ref 33–130)
ALT: 19 U/L (ref 6–29)
AST: 12 U/L (ref 10–35)
BILIRUBIN TOTAL: 0.6 mg/dL (ref 0.2–1.2)
Bilirubin, Direct: 0.1 mg/dL (ref <=0.2)
Indirect Bilirubin: 0.5 mg/dL (ref 0.2–1.2)
TOTAL PROTEIN: 6 g/dL — AB (ref 6.1–8.1)

## 2015-08-20 LAB — LIPID PANEL
CHOLESTEROL: 222 mg/dL — AB (ref 125–200)
HDL: 51 mg/dL (ref >=46)
LDL Cholesterol: 146 mg/dL — ABNORMAL HIGH (ref <130)
TRIGLYCERIDES: 125 mg/dL (ref <150)
Total CHOL/HDL Ratio: 4.4 Ratio (ref <=5.0)
VLDL: 25 mg/dL (ref <30)

## 2015-11-22 ENCOUNTER — Ambulatory Visit: Payer: Medicare Other | Admitting: Internal Medicine

## 2015-12-02 ENCOUNTER — Encounter: Payer: Self-pay | Admitting: Internal Medicine

## 2015-12-02 ENCOUNTER — Ambulatory Visit (INDEPENDENT_AMBULATORY_CARE_PROVIDER_SITE_OTHER): Payer: Medicare Other | Admitting: Internal Medicine

## 2015-12-02 VITALS — BP 110/74 | HR 74 | Temp 97.0°F | Ht 64.5 in | Wt 209.0 lb

## 2015-12-02 DIAGNOSIS — Z23 Encounter for immunization: Secondary | ICD-10-CM | POA: Diagnosis not present

## 2015-12-02 DIAGNOSIS — E7800 Pure hypercholesterolemia, unspecified: Secondary | ICD-10-CM

## 2015-12-02 DIAGNOSIS — I10 Essential (primary) hypertension: Secondary | ICD-10-CM | POA: Diagnosis not present

## 2015-12-02 DIAGNOSIS — E559 Vitamin D deficiency, unspecified: Secondary | ICD-10-CM | POA: Diagnosis not present

## 2015-12-02 DIAGNOSIS — F3289 Other specified depressive episodes: Secondary | ICD-10-CM

## 2015-12-02 DIAGNOSIS — R7302 Impaired glucose tolerance (oral): Secondary | ICD-10-CM

## 2015-12-02 LAB — HEPATIC FUNCTION PANEL
ALK PHOS: 91 U/L (ref 33–130)
ALT: 14 U/L (ref 6–29)
AST: 9 U/L — AB (ref 10–35)
Albumin: 4 g/dL (ref 3.6–5.1)
BILIRUBIN DIRECT: 0.1 mg/dL (ref <=0.2)
BILIRUBIN INDIRECT: 0.5 mg/dL (ref 0.2–1.2)
BILIRUBIN TOTAL: 0.6 mg/dL (ref 0.2–1.2)
Total Protein: 6.2 g/dL (ref 6.1–8.1)

## 2015-12-02 LAB — BASIC METABOLIC PANEL
BUN: 10 mg/dL (ref 7–25)
CO2: 30 mmol/L (ref 20–31)
CREATININE: 0.86 mg/dL (ref 0.50–0.99)
Calcium: 9.1 mg/dL (ref 8.6–10.4)
Chloride: 101 mmol/L (ref 98–110)
GLUCOSE: 131 mg/dL — AB (ref 65–99)
Potassium: 3.7 mmol/L (ref 3.5–5.3)
Sodium: 141 mmol/L (ref 135–146)

## 2015-12-02 LAB — LIPID PANEL
CHOL/HDL RATIO: 3.9 ratio (ref <5.0)
CHOLESTEROL: 189 mg/dL (ref <200)
HDL: 49 mg/dL — ABNORMAL LOW (ref >50)
LDL Cholesterol: 112 mg/dL — ABNORMAL HIGH (ref <100)
Triglycerides: 142 mg/dL (ref <150)
VLDL: 28 mg/dL (ref <30)

## 2015-12-02 LAB — HEMOGLOBIN A1C
Hgb A1c MFr Bld: 6.2 % — ABNORMAL HIGH (ref <5.7)
Mean Plasma Glucose: 131 mg/dL

## 2015-12-02 NOTE — Patient Instructions (Signed)
Flu vaccine given. Lab work drawn and pending with further instructions to follow. 3 Hemoccult cards given to be returned and processed. Return in 6 months for physical exam.

## 2015-12-02 NOTE — Progress Notes (Signed)
   Subjective:    Patient ID: Sarah [name], female    DOB: 12/01/1950, 65 y.o.   MRN: [id number]  HPI   In today for 6 month recheck. Submitted hemoccult cards in may but no record of having developed them in chart. Needs follow up on AIC, HTN and Hyperlipidemia. New set of Hemoccult cards given today. She declines colonoscopy. Vitamin D level drawn as well. History of hypokalemia on diuretic therapy. Potassium checked today. Long-standing history of dependent edema and hypertension. No new complaints.  Has had some left neck injections at [facility] for neck pain which is chronic.  She is on Neurontin for peripheral neuropathy. She is on Zocor for hyperlipidemia, losartan for hypertension and Lasix for dependent edema.  She is taking Klonopin Wellbutrin and Lexapro for long-standing history of depression   Review of Systems see above. Has some tingling in her toes which she has had for many years even prior to developing glucose intolerance.  At last visit hemoglobin A1c was elevated at 6.4% and previously was 5.9% year ago.     Objective:   Physical Exam Skin warm and dry. Nodes none. Neck is supple without JVD thyromegaly or carotid bruits. Chest clear to auscultation. Cardiac exam regular rate and rhythm. Extremities without pitting edema       Assessment & Plan:  History of depression-stable  Hyperlipidemia-lipid panel pending on Zocor  Essential hypertension-stable on current regimen  Hypokalemia-b- met checked  Screen for vitamin D deficiency-vitamin D level drawn  Screen for colon cancer-3 Hemoccult cards given  Controlled type 2 diabetes mellitus-hemoglobin A1c pending. No known diabetic medication at the present time. Just controlling it with diet alone.  Plan: Physical exam in 6 months with further instructions after lab work results obtained.

## 2015-12-03 LAB — VITAMIN D 25 HYDROXY (VIT D DEFICIENCY, FRACTURES): Vit D, 25-Hydroxy: 19 ng/mL — ABNORMAL LOW (ref 30–100)

## 2015-12-07 ENCOUNTER — Other Ambulatory Visit: Payer: Self-pay | Admitting: Internal Medicine

## 2016-01-20 HISTORY — PX: FOOT SURGERY: SHX648

## 2016-01-20 HISTORY — PX: OTHER SURGICAL HISTORY: SHX169

## 2016-02-10 ENCOUNTER — Other Ambulatory Visit: Payer: Self-pay | Admitting: Internal Medicine

## 2016-05-25 ENCOUNTER — Other Ambulatory Visit: Payer: Medicare Other | Admitting: Internal Medicine

## 2016-05-28 ENCOUNTER — Encounter: Payer: Medicare Other | Admitting: Internal Medicine

## 2016-06-05 ENCOUNTER — Other Ambulatory Visit: Payer: Self-pay | Admitting: Internal Medicine

## 2016-06-08 ENCOUNTER — Other Ambulatory Visit: Payer: Self-pay | Admitting: Internal Medicine

## 2016-07-20 ENCOUNTER — Encounter: Payer: Self-pay | Admitting: Internal Medicine

## 2016-07-20 ENCOUNTER — Ambulatory Visit (INDEPENDENT_AMBULATORY_CARE_PROVIDER_SITE_OTHER): Payer: Medicare Other | Admitting: Internal Medicine

## 2016-07-20 VITALS — BP 110/72 | HR 72 | Temp 98.0°F | Ht 64.25 in | Wt 228.0 lb

## 2016-07-20 DIAGNOSIS — Z1239 Encounter for other screening for malignant neoplasm of breast: Secondary | ICD-10-CM

## 2016-07-20 DIAGNOSIS — E8881 Metabolic syndrome: Secondary | ICD-10-CM | POA: Diagnosis not present

## 2016-07-20 DIAGNOSIS — L84 Corns and callosities: Secondary | ICD-10-CM

## 2016-07-20 DIAGNOSIS — E119 Type 2 diabetes mellitus without complications: Secondary | ICD-10-CM

## 2016-07-20 DIAGNOSIS — Z1329 Encounter for screening for other suspected endocrine disorder: Secondary | ICD-10-CM | POA: Diagnosis not present

## 2016-07-20 DIAGNOSIS — R609 Edema, unspecified: Secondary | ICD-10-CM | POA: Diagnosis not present

## 2016-07-20 DIAGNOSIS — Z0000 Encounter for general adult medical examination without abnormal findings: Secondary | ICD-10-CM | POA: Diagnosis not present

## 2016-07-20 DIAGNOSIS — Z6838 Body mass index (BMI) 38.0-38.9, adult: Secondary | ICD-10-CM

## 2016-07-20 DIAGNOSIS — E785 Hyperlipidemia, unspecified: Secondary | ICD-10-CM

## 2016-07-20 DIAGNOSIS — Z1231 Encounter for screening mammogram for malignant neoplasm of breast: Secondary | ICD-10-CM | POA: Diagnosis not present

## 2016-07-20 DIAGNOSIS — E876 Hypokalemia: Secondary | ICD-10-CM | POA: Diagnosis not present

## 2016-07-20 DIAGNOSIS — I10 Essential (primary) hypertension: Secondary | ICD-10-CM | POA: Diagnosis not present

## 2016-07-20 LAB — COMPLETE METABOLIC PANEL WITH GFR
ALK PHOS: 94 U/L (ref 33–130)
ALT: 14 U/L (ref 6–29)
AST: 10 U/L (ref 10–35)
Albumin: 3.9 g/dL (ref 3.6–5.1)
BILIRUBIN TOTAL: 0.5 mg/dL (ref 0.2–1.2)
BUN: 13 mg/dL (ref 7–25)
CALCIUM: 8.9 mg/dL (ref 8.6–10.4)
CO2: 26 mmol/L (ref 20–31)
CREATININE: 0.84 mg/dL (ref 0.50–0.99)
Chloride: 101 mmol/L (ref 98–110)
GFR, EST AFRICAN AMERICAN: 85 mL/min (ref >=60)
GFR, Est Non African American: 74 mL/min (ref >=60)
Glucose, Bld: 149 mg/dL — ABNORMAL HIGH (ref 65–99)
Potassium: 3.9 mmol/L (ref 3.5–5.3)
Sodium: 140 mmol/L (ref 135–146)
TOTAL PROTEIN: 6.1 g/dL (ref 6.1–8.1)

## 2016-07-20 LAB — CBC WITH DIFFERENTIAL/PLATELET
BASOS ABS: 0 {cells}/uL (ref 0–200)
BASOS PCT: 0 %
EOS ABS: 355 {cells}/uL (ref 15–500)
Eosinophils Relative: 5 %
HEMATOCRIT: 40.2 % (ref 35.0–45.0)
Hemoglobin: 13 g/dL (ref 11.7–15.5)
Lymphocytes Relative: 21 %
Lymphs Abs: 1491 cells/uL (ref 850–3900)
MCH: 28.6 pg (ref 27.0–33.0)
MCHC: 32.3 g/dL (ref 32.0–36.0)
MCV: 88.5 fL (ref 80.0–100.0)
MONO ABS: 497 {cells}/uL (ref 200–950)
MPV: 10.6 fL (ref 7.5–12.5)
Monocytes Relative: 7 %
NEUTROS ABS: 4757 {cells}/uL (ref 1500–7800)
Neutrophils Relative %: 67 %
Platelets: 266 10*3/uL (ref 140–400)
RBC: 4.54 MIL/uL (ref 3.80–5.10)
RDW: 14 % (ref 11.0–15.0)
WBC: 7.1 10*3/uL (ref 3.8–10.8)

## 2016-07-20 LAB — POCT URINALYSIS DIPSTICK
Bilirubin, UA: NEGATIVE
Blood, UA: NEGATIVE
Glucose, UA: NEGATIVE
KETONES UA: NEGATIVE
Leukocytes, UA: NEGATIVE
Nitrite, UA: NEGATIVE
PROTEIN UA: NEGATIVE
SPEC GRAV UA: 1.015 (ref 1.010–1.025)
Urobilinogen, UA: 0.2 E.U./dL
pH, UA: 6.5 (ref 5.0–8.0)

## 2016-07-20 LAB — LIPID PANEL
CHOL/HDL RATIO: 4 ratio (ref <5.0)
CHOLESTEROL: 183 mg/dL (ref <200)
HDL: 46 mg/dL — ABNORMAL LOW (ref >50)
LDL Cholesterol: 109 mg/dL — ABNORMAL HIGH (ref <100)
Triglycerides: 138 mg/dL (ref <150)
VLDL: 28 mg/dL (ref <30)

## 2016-07-20 LAB — TSH: TSH: 1.6 m[IU]/L

## 2016-07-20 MED ORDER — DOXYCYCLINE HYCLATE 100 MG PO TABS: 100.0000 mg | ORAL_TABLET | Freq: Two times a day (BID) | ORAL | 0 refills | Status: DC

## 2016-07-20 MED ORDER — FUROSEMIDE 40 MG PO TABS: 40.0000 mg | ORAL_TABLET | Freq: Two times a day (BID) | ORAL | 3 refills | Status: DC

## 2016-07-20 NOTE — Progress Notes (Signed)
Subjective:    Patient ID: Sarah [name], female    DOB: 1950/05/03, 66 y.o.   MRN: [id number]  HPI 66 year old Female for health maintenance exam and evaluation of medical issues.  Now on Lyrica instead of gabapentin per Dr. Rolena Infante. Having steroid neck injections at [facility] for hx of cervical disc disease.  Has been going to [facility] for knee injections- seems to have helped.  Has gained 14 pounds since last CPE  Bipolar disorder used to be treated by Dr. Caprice Beaver- now sees Hancock O'Neil,NP and has been stable.  No recent mammogram on file and was reminded of this. Order placed.  History of type 2 diabetes mellitus, hypertension, obesity, hyperlipidemia, dependent edema, metabolic syndrome, hypokalemia secondary to diuretic therapy. Has had multiple suicide attempts by overdose. Last attempt was August 2012.  Hospitalized April 2011 with drug overdose including clonazepam, Ambien, gabapentin. Has been depressed since her husband died of complications of pancreatic cancer in 2002. In July 2003 she overdosed on Ambien, Restoril, Seroquel. In 2004 she was hospitalized for 3 days at [facility].  She does not smoke or consume alcohol.  She is allergic penicillin  Several years ago was hospitalized for chest pain at [facility] where MI was ruled out.  Bilateral tubal ligation 1982, D&C 1989, laparoscopic cholecystectomy 1994, lumbar fusion 2003, hysterectomy with BSO 2005, fractured left elbow 2006.  Social history: She has noted dull daughter and one grandson. She remarried after her husband died but that marriage did not work out. She is now divorced and resides alone. Formerly worked for [employer] She slipped on some steps in 2000 and began to have severe back problems which subsequently resulted her obtaining disability benefits.  She was under the care of Dr. Hardin Negus for pain management for a long time.  She did get some better after Dr. Ellene Route did lumbar fusion in 2003.  Family history: Mother with history of bipolar disorder and history of CABG still living. Some dementia issues with mother. Father died with lung cancer but was a nonsmoker. One brother with history of lupus and one sister with history of COPD.   Review of Systems  Respiratory: Negative.   Cardiovascular: Negative.   Gastrointestinal: Negative.   Musculoskeletal: Positive for arthralgias.       Objective:   Physical Exam  Constitutional: She is oriented to person, place, and time. She appears well-developed and well-nourished. No distress.  HENT:  Head: Normocephalic and atraumatic.  Right Ear: External ear normal.  Left Ear: External ear normal.  Mouth/Throat: Oropharynx is clear and moist. No oropharyngeal exudate.  Eyes: Conjunctivae and EOM are normal. Pupils are equal, round, and reactive to light. Right eye exhibits no discharge. Left eye exhibits no discharge. No scleral icterus.  Neck: Neck supple. No JVD present. No thyromegaly present.  Cardiovascular: Normal rate, regular rhythm, normal heart sounds and intact distal pulses.   No murmur heard. Pulmonary/Chest: Effort normal and breath sounds normal. She has no wheezes. She has no rales.  Abdominal: Soft. Bowel sounds are normal. She exhibits no distension and no mass. There is no tenderness. There is no rebound and no guarding.  Musculoskeletal: She exhibits no edema.  Lymphadenopathy:    She has no cervical adenopathy.  Neurological: She is alert and oriented to person, place, and time. She has normal reflexes. No cranial nerve deficit. Coordination normal.  Skin: No rash noted. She is not diaphoretic.  Psychiatric: She has  a normal mood and affect. Her behavior is normal. Judgment and thought content normal.  Vitals reviewed.         Assessment & Plan:  Dependent edema-Long-standing issue. Hot weather this summer has not helped this.  Obesity-  14 pound weight gain- advise return to [employer]  Abrasion right foot-? Infection-This was a result of having a pedicure for callus was debridement with an instrument at nail salon. Doxycycline 100 mg twice daily for 10 days follow-up July 24.  Essential hypertension-stable on current regimen  Controlled type 2 diabetes mellitus-hemoglobin A1c 6.9% and previously was 6.2%. Needs to work on diet exercise and weight loss.  Hyperlipidemia  Metabolic syndrome  History of hypokalemia secondary to diuretic therapy  Dependent edema-watch salt intake and take Lasix twice daily.  History of depression and bipolar disorder  Plan: See above return July 24 of follow-up on foot issue and dependent edema.  Subjective:   Patient presents for Medicare Annual/Subsequent preventive examination.  Review Past Medical/Family/Social:See above   Risk Factors  Current exercise habits: Not a lot of exercise recently Dietary issues discussed: Low fat low carbohydrate  Cardiac risk factors:Family history, diabetes, hyperlipidemia, hypertension  Depression Screen  (Note: if answer to either of the following is "Yes", a more complete depression screening is indicated)   Over the past two weeks, have you felt down, depressed or hopeless? No  Over the past two weeks, have you felt little interest or pleasure in doing things? No Have you lost interest or pleasure in daily life? No Do you often feel hopeless? No Do you cry easily over simple problems? No   Activities of Daily Living  In your present state of health, do you have any difficulty performing the following activities?:   Driving? No  Managing money? No  Feeding yourself? No  Getting from bed to chair? No  Climbing a flight of stairs?Stairs are steep and narrow and mild home so I do have difficult  Preparing food and eating?: No  Bathing or showering? No  Getting dressed: No  Getting to the toilet? No  Using the toilet:No  Moving  around from place to place: No  In the past year have you fallen or had a near fall?: Yes lost balance Are you sexually active? No  Do you have more than one partner? No   Hearing Difficulties: No  Do you often ask people to speak up or repeat themselves? Yes Do you experience ringing or noises in your ears? No  Do you have difficulty understanding soft or whispered voices? Yes Do you feel that you have a problem with memory? No Do you often misplace items? No    Home Safety:  Do you have a smoke alarm at your residence? No Do you have grab bars in the bathroom? No Do you have throw rugs in your house? Yes   Cognitive Testing  Alert? Yes Normal Appearance?Yes  Oriented to person? Yes Place? Yes  Time? Yes  Recall of three objects? Yes  Can perform simple calculations? Yes  Displays appropriate judgment?Yes  Can read the correct time from a watch face?Yes   List the Names of Other Physician/Practitioners you currently use:  See referral list for the physicians patient is currently seeing.     Review of Systems: See above   Objective:     General appearance: Appears stated age and  obese  Head: Normocephalic, without obvious abnormality, atraumatic  Eyes: conj clear, EOMi PEERLA  Ears: normal TM's  and external ear canals both ears  Nose: Nares normal. Septum midline. Mucosa normal. No drainage or sinus tenderness.  Throat: lips, mucosa, and tongue normal; teeth and gums normal  Neck: no adenopathy, no carotid bruit, no JVD, supple, symmetrical, trachea midline and thyroid not enlarged, symmetric, no tenderness/mass/nodules  No CVA tenderness.  Lungs: clear to auscultation bilaterally  Breasts: normal appearance, no masses or tenderness Heart: regular rate and rhythm, S1, S2 normal, no murmur, click, rub or gallop  Abdomen: soft, non-tender; bowel sounds normal; no masses, no organomegaly  Musculoskeletal: ROM normal in all joints, no crepitus, no deformity, Normal  muscle strengthen. Back  is symmetric, no curvature. Skin: Skin color, texture, turgor normal. No rashes or lesions  Lymph nodes: Cervical, supraclavicular, and axillary nodes normal.  Neurologic: CN 2 -12 Normal, Normal symmetric reflexes. Normal coordination and gait  Psych: Alert & Oriented x 3, Mood appear stable.    Assessment:    Annual wellness medicare exam   Plan:    During the course of the visit the patient was educated and counseled about appropriate screening and preventive services including:   Annual mammogram  Recommend annual flu vaccine     Patient Instructions (the written plan) was given to the patient.  Medicare Attestation  I have personally reviewed:  The patient's medical and social history  Their use of alcohol, tobacco or illicit drugs  Their current medications and supplements  The patient's functional ability including ADLs,fall risks, home safety risks, cognitive, and hearing and visual impairment  Diet and physical activities  Evidence for depression or mood disorders  The patient's weight, height, BMI, and visual acuity have been recorded in the chart. I have made referrals, counseling, and provided education to the patient based on review of the above and I have provided the patient with a written personalized care plan for preventive services.

## 2016-07-21 LAB — HEMOGLOBIN A1C
HEMOGLOBIN A1C: 6.9 % — AB (ref <5.7)
MEAN PLASMA GLUCOSE: 151 mg/dL

## 2016-07-21 LAB — MICROALBUMIN / CREATININE URINE RATIO
Creatinine, Urine: 21 mg/dL (ref 20–320)
Microalb, Ur: <0.2 mg/dL

## 2016-08-03 ENCOUNTER — Ambulatory Visit
Admission: RE | Admit: 2016-08-03 | Discharge: 2016-08-03 | Disposition: A | Discharge status: Home / Self Care | Payer: Medicare Other | Source: Ambulatory Visit | Attending: Internal Medicine | Admitting: Internal Medicine

## 2016-08-03 ENCOUNTER — Encounter (INDEPENDENT_AMBULATORY_CARE_PROVIDER_SITE_OTHER): Payer: Self-pay

## 2016-08-03 DIAGNOSIS — Z1239 Encounter for other screening for malignant neoplasm of breast: Secondary | ICD-10-CM

## 2016-08-03 DIAGNOSIS — Z0000 Encounter for general adult medical examination without abnormal findings: Secondary | ICD-10-CM

## 2016-08-11 ENCOUNTER — Ambulatory Visit
Admission: RE | Admit: 2016-08-11 | Discharge: 2016-08-11 | Disposition: A | Discharge status: Home / Self Care | Payer: Medicare Other | Source: Ambulatory Visit | Attending: Internal Medicine | Admitting: Internal Medicine

## 2016-08-11 ENCOUNTER — Encounter: Payer: Self-pay | Admitting: Internal Medicine

## 2016-08-11 ENCOUNTER — Ambulatory Visit (INDEPENDENT_AMBULATORY_CARE_PROVIDER_SITE_OTHER): Payer: Medicare Other | Admitting: Internal Medicine

## 2016-08-11 VITALS — BP 104/68 | HR 74 | Temp 98.2°F | Wt 225.0 lb

## 2016-08-11 DIAGNOSIS — M79671 Pain in right foot: Secondary | ICD-10-CM

## 2016-08-11 DIAGNOSIS — R609 Edema, unspecified: Secondary | ICD-10-CM | POA: Diagnosis not present

## 2016-08-11 DIAGNOSIS — I10 Essential (primary) hypertension: Secondary | ICD-10-CM | POA: Diagnosis not present

## 2016-08-11 DIAGNOSIS — F3176 Bipolar disorder, in full remission, most recent episode depressed: Secondary | ICD-10-CM

## 2016-08-11 LAB — BASIC METABOLIC PANEL
BUN: 11 mg/dL (ref 7–25)
CALCIUM: 8.6 mg/dL (ref 8.6–10.4)
CO2: 28 mmol/L (ref 20–31)
CREATININE: 0.87 mg/dL (ref 0.50–0.99)
Chloride: 100 mmol/L (ref 98–110)
GLUCOSE: 129 mg/dL — AB (ref 65–99)
Potassium: 3.3 mmol/L — ABNORMAL LOW (ref 3.5–5.3)
SODIUM: 141 mmol/L (ref 135–146)

## 2016-08-11 MED ORDER — TORSEMIDE 20 MG PO TABS: 20.0000 mg | ORAL_TABLET | Freq: Two times a day (BID) | ORAL | 1 refills | Status: DC

## 2016-08-11 NOTE — Progress Notes (Signed)
   Subjective:    Patient ID: Sarah [name], female    DOB: [birth year], 66 y.o.   MRN: [id number]  HPI   66 year old Female with dependent edema here today for follow-up. I thought at last visit she was taking Lasix just once daily but she actually has been taking it twice daily which is something I overlooked. She still complaining bitterly of edema by the end of the day although today it doesn't look very bad at all here in the office around 11 AM. The weather is cooler this week.  Regarding plantar aspect of right foot. She had a callus that was debrided at a local nail salon several weeks ago. Bleeding ensued during the procedure. At last visit I placed her on doxycycline for 10 days. There was an abraded area in the center of the callus that had not healed. I was concerned about a secondary infection. She says it still quite sore. I have ordered  Xray of her right foot today. We will refer her to podiatrist for further evaluation. X-ray is to see if there is osteomyelitis.  She is still concerned about her daughter. Daughter is having lots of medical issues.    Review of Systems see above     Objective:   Physical Exam No significant edema of the lower extremities today. She has a callus over right distal metatarsal plantar aspect with central abraded area.       Assessment & Plan:  ? Infected callus-rule out osteomyelitis. Patient to  have x-ray of foot. Refer to podiatrist.  Dependent edema-not an issue today but patient says gets worse during the day. We are going to change Lasix to torsemide 20 mg twice daily and she will follow-up in 3-4 weeks with B- met and nurse visit

## 2016-08-11 NOTE — Patient Instructions (Addendum)
X-ray of right foot to check for osteomyelitis. Change Lasix to torsemide 20 mg twice daily and follow-up with nurse visit and basic metabolic panel in 3-4 weeks. Refer to podiatrist regarding callus.

## 2016-08-16 NOTE — Patient Instructions (Signed)
Doxycycline for possible foot infection and return July 24 for follow-up on that and dependent edema.

## 2016-09-03 ENCOUNTER — Other Ambulatory Visit: Payer: Medicare Other | Admitting: Internal Medicine

## 2016-09-03 DIAGNOSIS — R609 Edema, unspecified: Secondary | ICD-10-CM

## 2016-09-04 LAB — BASIC METABOLIC PANEL WITH GFR
BUN: 14 mg/dL (ref 7–25)
CO2: 24 mmol/L (ref 20–32)
Calcium: 8.8 mg/dL (ref 8.6–10.4)
Chloride: 100 mmol/L (ref 98–110)
Creat: 0.89 mg/dL (ref 0.50–0.99)
Glucose, Bld: 136 mg/dL — ABNORMAL HIGH (ref 65–99)
Potassium: 3.7 mmol/L (ref 3.5–5.3)
Sodium: 142 mmol/L (ref 135–146)

## 2016-09-13 ENCOUNTER — Other Ambulatory Visit: Payer: Self-pay | Admitting: Internal Medicine

## 2016-09-13 NOTE — Telephone Encounter (Signed)
Refill all  For 6 months

## 2016-11-17 DIAGNOSIS — C4371 Malignant melanoma of right lower limb, including hip: Secondary | ICD-10-CM | POA: Insufficient documentation

## 2016-12-18 DIAGNOSIS — L089 Local infection of the skin and subcutaneous tissue, unspecified: Secondary | ICD-10-CM | POA: Insufficient documentation

## 2016-12-21 DIAGNOSIS — T8149XA Infection following a procedure, other surgical site, initial encounter: Secondary | ICD-10-CM | POA: Insufficient documentation

## 2016-12-24 MED ORDER — GABAPENTIN 300 MG PO CAPS
300.00 | ORAL_CAPSULE | ORAL | Status: DC
Start: 2016-12-24 — End: 2016-12-24

## 2016-12-24 MED ORDER — POLYETHYLENE GLYCOL 3350 17 G PO PACK
17.00 | PACK | ORAL | Status: DC
Start: 2016-12-24 — End: 2016-12-24

## 2016-12-24 MED ORDER — BUPROPION HCL ER (SR) 100 MG PO TB12
100.00 | ORAL_TABLET | ORAL | Status: DC
Start: 2016-12-24 — End: 2016-12-24

## 2016-12-24 MED ORDER — GENERIC EXTERNAL MEDICATION
1000.00 | Status: DC
Start: 2016-12-25 — End: 2016-12-24

## 2016-12-24 MED ORDER — OXYCODONE HCL 5 MG PO TABS
5.00 | ORAL_TABLET | ORAL | Status: DC
Start: ? — End: 2016-12-24

## 2016-12-24 MED ORDER — IBUPROFEN 400 MG PO TABS
400.00 | ORAL_TABLET | ORAL | Status: DC
Start: ? — End: 2016-12-24

## 2016-12-24 MED ORDER — LORAZEPAM 1 MG PO TABS
1.00 | ORAL_TABLET | ORAL | Status: DC
Start: 2016-12-24 — End: 2016-12-24

## 2016-12-24 MED ORDER — BISACODYL 10 MG RE SUPP
10.00 | RECTAL | Status: DC
Start: ? — End: 2016-12-24

## 2016-12-24 MED ORDER — ESCITALOPRAM OXALATE 10 MG PO TABS
10.00 | ORAL_TABLET | ORAL | Status: DC
Start: 2016-12-25 — End: 2016-12-24

## 2016-12-24 MED ORDER — ENOXAPARIN SODIUM 40 MG/0.4ML ~~LOC~~ SOLN
40.00 | SUBCUTANEOUS | Status: DC
Start: 2016-12-25 — End: 2016-12-24

## 2016-12-24 MED ORDER — ONDANSETRON HCL 4 MG/2ML IJ SOLN
4.00 | INTRAMUSCULAR | Status: DC
Start: ? — End: 2016-12-24

## 2016-12-24 MED ORDER — LOSARTAN POTASSIUM 50 MG PO TABS
50.00 | ORAL_TABLET | ORAL | Status: DC
Start: 2016-12-25 — End: 2016-12-24

## 2016-12-24 MED ORDER — DOXYCYCLINE MONOHYDRATE 100 MG PO TABS
100.00 | ORAL_TABLET | ORAL | Status: DC
Start: 2016-12-24 — End: 2016-12-24

## 2016-12-24 MED ORDER — SIMVASTATIN 20 MG PO TABS
10.00 | ORAL_TABLET | ORAL | Status: DC
Start: 2016-12-24 — End: 2016-12-24

## 2016-12-24 MED ORDER — ZOLPIDEM TARTRATE 10 MG PO TABS
10.00 | ORAL_TABLET | ORAL | Status: DC
Start: 2016-12-24 — End: 2016-12-24

## 2016-12-24 MED ORDER — ACETAMINOPHEN 325 MG PO TABS
975.00 | ORAL_TABLET | ORAL | Status: DC
Start: 2016-12-24 — End: 2016-12-24

## 2017-01-06 MED ORDER — GENERIC EXTERNAL MEDICATION
Status: DC
Start: ? — End: 2017-01-06

## 2017-01-06 MED ORDER — SIMVASTATIN 20 MG PO TABS
10.00 | ORAL_TABLET | ORAL | Status: DC
Start: 2017-01-06 — End: 2017-01-06

## 2017-01-06 MED ORDER — HYDROXYZINE HCL 10 MG PO TABS
10.00 | ORAL_TABLET | ORAL | Status: DC
Start: ? — End: 2017-01-06

## 2017-01-06 MED ORDER — GABAPENTIN 300 MG PO CAPS
300.00 | ORAL_CAPSULE | ORAL | Status: DC
Start: 2017-01-06 — End: 2017-01-06

## 2017-01-06 MED ORDER — MAGNESIUM HYDROXIDE 400 MG/5ML PO SUSP
15.00 | ORAL | Status: DC
Start: 2017-01-06 — End: 2017-01-06

## 2017-01-06 MED ORDER — POTASSIUM CHLORIDE CRYS ER 20 MEQ PO TBCR
20.00 | EXTENDED_RELEASE_TABLET | ORAL | Status: DC
Start: 2017-01-07 — End: 2017-01-06

## 2017-01-06 MED ORDER — HYDROCORTISONE 1 % EX CREA
TOPICAL_CREAM | CUTANEOUS | Status: DC
Start: ? — End: 2017-01-06

## 2017-01-06 MED ORDER — GENERIC EXTERNAL MEDICATION
750.00 | Status: DC
Start: 2017-01-06 — End: 2017-01-06

## 2017-01-06 MED ORDER — LOSARTAN POTASSIUM 50 MG PO TABS
50.00 | ORAL_TABLET | ORAL | Status: DC
Start: 2017-01-07 — End: 2017-01-06

## 2017-01-06 MED ORDER — BUPROPION HCL ER (SR) 100 MG PO TB12
100.00 | ORAL_TABLET | ORAL | Status: DC
Start: 2017-01-06 — End: 2017-01-06

## 2017-01-06 MED ORDER — ENOXAPARIN SODIUM 40 MG/0.4ML ~~LOC~~ SOLN
40.00 | SUBCUTANEOUS | Status: DC
Start: 2017-01-07 — End: 2017-01-06

## 2017-01-06 MED ORDER — CLONAZEPAM 0.25 MG PO TBDP
1.00 | ORAL_TABLET | ORAL | Status: DC
Start: ? — End: 2017-01-06

## 2017-01-06 MED ORDER — EPINEPHRINE PF 1 MG/ML IJ SOLN
0.30 | INTRAMUSCULAR | Status: DC
Start: ? — End: 2017-01-06

## 2017-01-06 MED ORDER — FLUTICASONE PROPIONATE 50 MCG/ACT NA SUSP
1.00 | NASAL | Status: DC
Start: ? — End: 2017-01-06

## 2017-01-06 MED ORDER — POLYETHYLENE GLYCOL 3350 17 G PO PACK
17.00 | PACK | ORAL | Status: DC
Start: 2017-01-06 — End: 2017-01-06

## 2017-01-06 MED ORDER — DIPHENHYDRAMINE HCL 50 MG/ML IJ SOLN
25.00 | INTRAMUSCULAR | Status: DC
Start: ? — End: 2017-01-06

## 2017-01-06 MED ORDER — ACETAMINOPHEN 325 MG PO TABS
975.00 | ORAL_TABLET | ORAL | Status: DC
Start: 2017-01-06 — End: 2017-01-06

## 2017-01-06 MED ORDER — ESCITALOPRAM OXALATE 10 MG PO TABS
10.00 | ORAL_TABLET | ORAL | Status: DC
Start: 2017-01-07 — End: 2017-01-06

## 2017-01-06 MED ORDER — CALCIUM CARBONATE ANTACID 750 MG PO CHEW
CHEWABLE_TABLET | ORAL | Status: DC
Start: ? — End: 2017-01-06

## 2017-01-06 MED ORDER — ZOLPIDEM TARTRATE 5 MG PO TABS
10.00 | ORAL_TABLET | ORAL | Status: DC
Start: 2017-01-06 — End: 2017-01-06

## 2017-01-06 MED ORDER — TRIAMCINOLONE ACETONIDE 0.1 % EX CREA
TOPICAL_CREAM | CUTANEOUS | Status: DC
Start: 2017-01-06 — End: 2017-01-06

## 2017-01-07 DIAGNOSIS — C779 Secondary and unspecified malignant neoplasm of lymph node, unspecified: Secondary | ICD-10-CM

## 2017-01-07 DIAGNOSIS — C439 Malignant melanoma of skin, unspecified: Secondary | ICD-10-CM | POA: Insufficient documentation

## 2017-01-07 DIAGNOSIS — Z85820 Personal history of malignant melanoma of skin: Secondary | ICD-10-CM | POA: Insufficient documentation

## 2017-03-25 ENCOUNTER — Other Ambulatory Visit: Payer: Self-pay | Admitting: Internal Medicine

## 2017-04-15 ENCOUNTER — Other Ambulatory Visit: Payer: Self-pay | Admitting: Internal Medicine

## 2017-04-28 DIAGNOSIS — R110 Nausea: Secondary | ICD-10-CM | POA: Insufficient documentation

## 2017-05-21 DIAGNOSIS — F332 Major depressive disorder, recurrent severe without psychotic features: Secondary | ICD-10-CM | POA: Insufficient documentation

## 2017-06-19 ENCOUNTER — Other Ambulatory Visit: Payer: Self-pay | Admitting: Internal Medicine

## 2017-07-07 ENCOUNTER — Telehealth: Payer: Self-pay

## 2017-07-07 NOTE — Telephone Encounter (Signed)
Left voice mail, patient is due for a physical.

## 2017-09-03 ENCOUNTER — Emergency Department (HOSPITAL_COMMUNITY): Payer: Medicare Other

## 2017-09-03 ENCOUNTER — Other Ambulatory Visit: Payer: Self-pay

## 2017-09-03 ENCOUNTER — Observation Stay (HOSPITAL_COMMUNITY)
Admission: EM | Admit: 2017-09-03 | Discharge: 2017-09-06 | Disposition: A | Discharge status: Home / Self Care | Payer: Medicare Other | Attending: Internal Medicine | Admitting: Internal Medicine

## 2017-09-03 DIAGNOSIS — R2689 Other abnormalities of gait and mobility: Secondary | ICD-10-CM | POA: Diagnosis not present

## 2017-09-03 DIAGNOSIS — R5381 Other malaise: Secondary | ICD-10-CM

## 2017-09-03 DIAGNOSIS — F39 Unspecified mood [affective] disorder: Secondary | ICD-10-CM | POA: Diagnosis present

## 2017-09-03 DIAGNOSIS — I10 Essential (primary) hypertension: Secondary | ICD-10-CM | POA: Diagnosis present

## 2017-09-03 DIAGNOSIS — E1159 Type 2 diabetes mellitus with other circulatory complications: Secondary | ICD-10-CM | POA: Diagnosis present

## 2017-09-03 DIAGNOSIS — E1169 Type 2 diabetes mellitus with other specified complication: Secondary | ICD-10-CM

## 2017-09-03 DIAGNOSIS — R197 Diarrhea, unspecified: Secondary | ICD-10-CM

## 2017-09-03 DIAGNOSIS — E876 Hypokalemia: Secondary | ICD-10-CM | POA: Diagnosis not present

## 2017-09-03 DIAGNOSIS — E860 Dehydration: Secondary | ICD-10-CM

## 2017-09-03 DIAGNOSIS — E119 Type 2 diabetes mellitus without complications: Secondary | ICD-10-CM

## 2017-09-03 DIAGNOSIS — F419 Anxiety disorder, unspecified: Secondary | ICD-10-CM | POA: Diagnosis not present

## 2017-09-03 DIAGNOSIS — R627 Adult failure to thrive: Secondary | ICD-10-CM | POA: Diagnosis present

## 2017-09-03 DIAGNOSIS — M6281 Muscle weakness (generalized): Secondary | ICD-10-CM | POA: Diagnosis not present

## 2017-09-03 DIAGNOSIS — R0600 Dyspnea, unspecified: Secondary | ICD-10-CM | POA: Diagnosis not present

## 2017-09-03 DIAGNOSIS — R531 Weakness: Secondary | ICD-10-CM

## 2017-09-03 DIAGNOSIS — I152 Hypertension secondary to endocrine disorders: Secondary | ICD-10-CM | POA: Diagnosis present

## 2017-09-03 DIAGNOSIS — F32A Depression, unspecified: Secondary | ICD-10-CM | POA: Diagnosis present

## 2017-09-03 DIAGNOSIS — R112 Nausea with vomiting, unspecified: Secondary | ICD-10-CM | POA: Diagnosis present

## 2017-09-03 DIAGNOSIS — R2681 Unsteadiness on feet: Secondary | ICD-10-CM | POA: Diagnosis not present

## 2017-09-03 DIAGNOSIS — R51 Headache: Secondary | ICD-10-CM | POA: Diagnosis not present

## 2017-09-03 DIAGNOSIS — F329 Major depressive disorder, single episode, unspecified: Secondary | ICD-10-CM | POA: Diagnosis present

## 2017-09-03 DIAGNOSIS — W19XXXA Unspecified fall, initial encounter: Secondary | ICD-10-CM

## 2017-09-03 DIAGNOSIS — I4581 Long QT syndrome: Secondary | ICD-10-CM | POA: Diagnosis not present

## 2017-09-03 DIAGNOSIS — R9431 Abnormal electrocardiogram [ECG] [EKG]: Secondary | ICD-10-CM | POA: Diagnosis present

## 2017-09-03 DIAGNOSIS — K529 Noninfective gastroenteritis and colitis, unspecified: Secondary | ICD-10-CM | POA: Diagnosis not present

## 2017-09-03 LAB — URINALYSIS, ROUTINE W REFLEX MICROSCOPIC
Bilirubin Urine: NEGATIVE
Glucose, UA: NEGATIVE mg/dL
Hgb urine dipstick: NEGATIVE
KETONES UR: 5 mg/dL — AB
LEUKOCYTES UA: NEGATIVE
NITRITE: NEGATIVE
PH: 5 (ref 5.0–8.0)
Protein, ur: NEGATIVE mg/dL
Specific Gravity, Urine: 1.016 (ref 1.005–1.030)

## 2017-09-03 LAB — COMPREHENSIVE METABOLIC PANEL
ALT: 19 U/L (ref 0–44)
ANION GAP: 16 — AB (ref 5–15)
AST: 19 U/L (ref 15–41)
Albumin: 3.7 g/dL (ref 3.5–5.0)
Alkaline Phosphatase: 69 U/L (ref 38–126)
BUN: 12 mg/dL (ref 8–23)
CHLORIDE: 95 mmol/L — AB (ref 98–111)
CO2: 26 mmol/L (ref 22–32)
Calcium: 9.1 mg/dL (ref 8.9–10.3)
Creatinine, Ser: 1.09 mg/dL — ABNORMAL HIGH (ref 0.44–1.00)
GFR calc non Af Amer: 52 mL/min — ABNORMAL LOW (ref >60)
GFR, EST AFRICAN AMERICAN: 60 mL/min — AB (ref >60)
Glucose, Bld: 158 mg/dL — ABNORMAL HIGH (ref 70–99)
Potassium: 3.4 mmol/L — ABNORMAL LOW (ref 3.5–5.1)
SODIUM: 137 mmol/L (ref 135–145)
Total Bilirubin: 1.1 mg/dL (ref 0.3–1.2)
Total Protein: 6.7 g/dL (ref 6.5–8.1)

## 2017-09-03 LAB — CBC
HCT: 41.9 % (ref 36.0–46.0)
Hemoglobin: 14.9 g/dL (ref 12.0–15.0)
MCH: 31 pg (ref 26.0–34.0)
MCHC: 35.6 g/dL (ref 30.0–36.0)
MCV: 87.1 fL (ref 78.0–100.0)
PLATELETS: 317 10*3/uL (ref 150–400)
RBC: 4.81 MIL/uL (ref 3.87–5.11)
RDW: 12.7 % (ref 11.5–15.5)
WBC: 6.3 10*3/uL (ref 4.0–10.5)

## 2017-09-03 LAB — BRAIN NATRIURETIC PEPTIDE: B NATRIURETIC PEPTIDE 5: 23.2 pg/mL (ref 0.0–100.0)

## 2017-09-03 LAB — TROPONIN I

## 2017-09-03 MED ORDER — PROCHLORPERAZINE EDISYLATE 10 MG/2ML IJ SOLN
5.0000 mg | Freq: Once | INTRAMUSCULAR | Status: AC
  Administered 2017-09-03: 5 mg via INTRAVENOUS
  Filled 2017-09-03: qty 2

## 2017-09-03 MED ORDER — SODIUM CHLORIDE 0.9 % IV BOLUS
1000.0000 mL | Freq: Once | INTRAVENOUS | Status: AC
  Administered 2017-09-03: 1000 mL via INTRAVENOUS

## 2017-09-03 MED ORDER — DIPHENHYDRAMINE HCL 50 MG/ML IJ SOLN: 25.0000 mg | Freq: Once | INTRAMUSCULAR | Status: DC

## 2017-09-03 MED ORDER — DIPHENHYDRAMINE HCL 50 MG/ML IJ SOLN
12.5000 mg | Freq: Once | INTRAMUSCULAR | Status: AC
  Administered 2017-09-03: 12.5 mg via INTRAVENOUS
  Filled 2017-09-03: qty 1

## 2017-09-03 MED ORDER — ONDANSETRON HCL 4 MG/2ML IJ SOLN
4.0000 mg | Freq: Once | INTRAMUSCULAR | Status: AC
  Administered 2017-09-03: 4 mg via INTRAVENOUS
  Filled 2017-09-03: qty 2

## 2017-09-03 NOTE — ED Notes (Signed)
Placed Purwik

## 2017-09-03 NOTE — ED Triage Notes (Addendum)
Pt to ED via POV with c/o N/V/D for the past 2 weeks. 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 Pt is currently not taking any chemo treatments. Pt states progressive weakness that has caused her to fall down the stairs two days ago. Pt is complaining of left shoulder and left foot pain 6/10. Pt is A&O x4.

## 2017-09-03 NOTE — ED Provider Notes (Signed)
[facility] Emergency Department Provider Note MRN:  [id number]  Arrival date & time: 09/04/17     Chief Complaint   Fall; Nausea; and Weakness   History of Present Illness   Sarah [name] is a 67 y.o. year-old female with a history of stage IV melanoma presenting to the ED with chief complaint of weakness, malaise.  Patient has been taking a biweekly IV medication to treat her stage IV melanoma.  For the past 1 to 2 months she is experienced significant malaise, nausea, weight loss that appears to be a side effect of this medication.  Has not had this medication for the past month due to intolerance.  Patient fell down 10 stairs 2 days ago, denies head trauma, no loss of consciousness, no significant pain since the fall.  Her malaise and nausea have been worse since that time.  Now with constant mild to moderate severity headaches.  Denies fever, no chest pain, endorsing dyspnea with exertion.  No abdominal pain, no dysuria.  No exacerbating or relieving factors.  Review of Systems  A complete 10 system review of systems was obtained and all systems are negative except as noted in the HPI and PMH.   Patient's Health History    Past Medical History:  Diagnosis Date  . Anxiety   . Constipation   . Dependent edema   . Depression   . Diabetes mellitus   . Dysmenorrhea   . H/O: suicide attempt   . Hyperlipidemia   . Hypertension   . Insomnia   . PMS (premenstrual syndrome)     Past Surgical History:  Procedure Laterality Date  . ABDOMINAL HYSTERECTOMY    . BREAST BIOPSY Right    benign  . CHOLECYSTECTOMY    . DILATION AND CURETTAGE OF UTERUS    . LUMBAR FUSION    . TUBAL LIGATION      Family History  Problem Relation Age of Onset  . Diabetes Mother   . Mental illness Mother     Social History   Socioeconomic History  . Marital status: Widowed    Spouse name: Not on file  . Number of children: Not on file  . Years of education: Not on file  .  Highest education level: Not on file  Occupational History  . Not on file  Social Needs  . Financial resource strain: Not on file  . Food insecurity:    Worry: Not on file    Inability: Not on file  . Transportation needs:    Medical: Not on file    Non-medical: Not on file  Tobacco Use  . Smoking status: Never Smoker  . Smokeless tobacco: Never Used  Substance and Sexual Activity  . Alcohol use: Not on file  . Drug use: Not on file  . Sexual activity: Not on file  Lifestyle  . Physical activity:    Days per week: Not on file    Minutes per session: Not on file  . Stress: Not on file  Relationships  . Social connections:    Talks on phone: Not on file    Gets together: Not on file    Attends religious service: Not on file    Active member of club or organization: Not on file    Attends meetings of clubs or organizations: Not on file    Relationship status: Not on file  . Intimate partner violence:    Fear of current or ex partner: Not on file  Emotionally abused: Not on file    Physically abused: Not on file    Forced sexual activity: Not on file  Other Topics Concern  . Not on file  Social History Narrative  . Not on file     Physical Exam  Vital Signs and Nursing Notes reviewed Vitals:   09/03/17 1921 09/03/17 2329  BP: 108/72 (!) 123/98  Pulse: 81 79  Resp: 15 16  Temp: 99.2 F (37.3 C) 98.4 F (36.9 C)  SpO2: 95% 95%    CONSTITUTIONAL: Tired-appearing, NAD NEURO:  Alert and oriented x 3, no focal deficits EYES:  eyes equal and reactive ENT/NECK:  no LAD, no JVD CARDIO: Regular rate, well-perfused, normal S1 and S2 PULM:  CTAB no wheezing or rhonchi GI/GU:  normal bowel sounds, non-distended, non-tender MSK/SPINE:  No gross deformities, no edema SKIN:  no rash, atraumatic PSYCH:  Appropriate speech and behavior  Diagnostic and Interventional Summary    EKG Interpretation  Date/Time:  Friday September 03 2017 16:39:14 EDT Ventricular Rate:   84 PR Interval:    QRS Duration: 127 QT Interval:  448 QTC Calculation: 530 R Axis:   -45 Text Interpretation:  Sinus rhythm Left bundle branch block Confirmed by Gerlene Fee [phone]) on 09/03/2017 4:52:50 PM      Labs Reviewed  COMPREHENSIVE METABOLIC PANEL - Abnormal; Notable for the following components:      Result Value   Potassium 3.4 (*)    Chloride 95 (*)    Glucose, Bld 158 (*)    Creatinine, Ser 1.09 (*)    GFR calc non Af Amer 52 (*)    GFR calc Af Amer 60 (*)    Anion gap 16 (*)    All other components within normal limits  URINALYSIS, ROUTINE W REFLEX MICROSCOPIC - Abnormal; Notable for the following components:   Ketones, ur 5 (*)    All other components within normal limits  CBC  BRAIN NATRIURETIC PEPTIDE  TROPONIN I    CT Head Wo Contrast  Final Result    DG Chest 2 View  Final Result      Medications  sodium chloride 0.9 % bolus 1,000 mL (0 mLs Intravenous Stopped 09/03/17 1813)  ondansetron (ZOFRAN) injection 4 mg (4 mg Intravenous Given 09/03/17 1652)  prochlorperazine (COMPAZINE) injection 5 mg (5 mg Intravenous Given 09/03/17 1653)  diphenhydrAMINE (BENADRYL) injection 12.5 mg (12.5 mg Intravenous Given 09/03/17 1653)  sodium chloride 0.9 % bolus 1,000 mL (0 mLs Intravenous Stopped 09/03/17 2157)     Procedures Critical Care  ED Course and Medical Decision Making  I have reviewed the triage vital signs and the nursing notes.  Pertinent labs & imaging results that were available during my care of the patient were reviewed by me and considered in my medical decision making (see below for details). Clinical Course as of Sep 04 5  Fri Sep 04, 1547  2541 67 year old female history of metastatic melanoma here with acute on chronic worsening of vague symptoms of malaise, fatigue ever since starting chemotherapy.  No chemo for 1 month but continuation of symptoms.  Considering continued side effect of chemotherapy, metabolic disarray, UTI.  Also  considering subdural hematoma given recent fall.  Work-up largely unremarkable, awaiting UA.  Patient still feeling unwell, will provide further IV fluids and reassess.   [MB]    Clinical Course User Index [MB] Maudie Flakes, MD    Patient with no urine production for 6 hours despite 2 L normal saline.  Required INO catheter to confirm no UTI today.  Continued malaise, p.o. intolerance.  Will consult hospitalist for admission.  Barth Kirks. Sedonia Small, MD [place] [email or web address]  Final Clinical Impressions(s) / ED Diagnoses     ICD-10-CM   1. Dehydration E86.0   2. Dyspnea R06.00 DG Chest 2 View    DG Chest 2 View  3. Weakness R53.1   4. Fall, initial encounter W19.XXXA   5. Malaise R53.81     ED Discharge Orders    None         Maudie Flakes, MD 09/04/17 [phone]

## 2017-09-04 ENCOUNTER — Observation Stay (HOSPITAL_COMMUNITY): Payer: Medicare Other

## 2017-09-04 ENCOUNTER — Encounter (HOSPITAL_COMMUNITY): Payer: Self-pay | Admitting: *Deleted

## 2017-09-04 DIAGNOSIS — R627 Adult failure to thrive: Secondary | ICD-10-CM | POA: Diagnosis present

## 2017-09-04 DIAGNOSIS — F419 Anxiety disorder, unspecified: Secondary | ICD-10-CM | POA: Diagnosis present

## 2017-09-04 DIAGNOSIS — W19XXXA Unspecified fall, initial encounter: Secondary | ICD-10-CM

## 2017-09-04 DIAGNOSIS — F3289 Other specified depressive episodes: Secondary | ICD-10-CM

## 2017-09-04 DIAGNOSIS — I10 Essential (primary) hypertension: Secondary | ICD-10-CM

## 2017-09-04 DIAGNOSIS — R197 Diarrhea, unspecified: Secondary | ICD-10-CM | POA: Diagnosis not present

## 2017-09-04 DIAGNOSIS — E876 Hypokalemia: Secondary | ICD-10-CM

## 2017-09-04 DIAGNOSIS — R112 Nausea with vomiting, unspecified: Secondary | ICD-10-CM | POA: Diagnosis not present

## 2017-09-04 DIAGNOSIS — I4581 Long QT syndrome: Secondary | ICD-10-CM | POA: Diagnosis not present

## 2017-09-04 DIAGNOSIS — E118 Type 2 diabetes mellitus with unspecified complications: Secondary | ICD-10-CM

## 2017-09-04 DIAGNOSIS — K529 Noninfective gastroenteritis and colitis, unspecified: Secondary | ICD-10-CM | POA: Diagnosis not present

## 2017-09-04 DIAGNOSIS — R9431 Abnormal electrocardiogram [ECG] [EKG]: Secondary | ICD-10-CM | POA: Diagnosis present

## 2017-09-04 DIAGNOSIS — E860 Dehydration: Secondary | ICD-10-CM

## 2017-09-04 LAB — C DIFFICILE QUICK SCREEN W PCR REFLEX
C DIFFICILE (CDIFF) INTERP: NOT DETECTED
C DIFFICLE (CDIFF) ANTIGEN: NEGATIVE
C Diff toxin: NEGATIVE

## 2017-09-04 LAB — LIPASE, BLOOD: Lipase: 27 U/L (ref 11–51)

## 2017-09-04 LAB — GLUCOSE, CAPILLARY
GLUCOSE-CAPILLARY: 123 mg/dL — AB (ref 70–99)
GLUCOSE-CAPILLARY: 93 mg/dL (ref 70–99)
GLUCOSE-CAPILLARY: 117 mg/dL — AB (ref 70–99)
GLUCOSE-CAPILLARY: 111 mg/dL — AB (ref 70–99)
Glucose-Capillary: 107 mg/dL — ABNORMAL HIGH (ref 70–99)

## 2017-09-04 LAB — BASIC METABOLIC PANEL
Anion gap: 11 (ref 5–15)
BUN: 10 mg/dL (ref 8–23)
CHLORIDE: 101 mmol/L (ref 98–111)
CO2: 25 mmol/L (ref 22–32)
Calcium: 8.4 mg/dL — ABNORMAL LOW (ref 8.9–10.3)
Creatinine, Ser: 0.89 mg/dL (ref 0.44–1.00)
GFR calc Af Amer: >60 mL/min (ref >60)
GFR calc non Af Amer: >60 mL/min (ref >60)
Glucose, Bld: 122 mg/dL — ABNORMAL HIGH (ref 70–99)
POTASSIUM: 2.8 mmol/L — AB (ref 3.5–5.1)
SODIUM: 137 mmol/L (ref 135–145)

## 2017-09-04 LAB — CBC
HCT: 37.3 % (ref 36.0–46.0)
Hemoglobin: 12.7 g/dL (ref 12.0–15.0)
MCH: 30 pg (ref 26.0–34.0)
MCHC: 34 g/dL (ref 30.0–36.0)
MCV: 88.2 fL (ref 78.0–100.0)
Platelets: 254 10*3/uL (ref 150–400)
RBC: 4.23 MIL/uL (ref 3.87–5.11)
RDW: 12.9 % (ref 11.5–15.5)
WBC: 5.6 10*3/uL (ref 4.0–10.5)

## 2017-09-04 LAB — TSH: TSH: 1.673 u[IU]/mL (ref 0.350–4.500)

## 2017-09-04 LAB — HIV ANTIBODY (ROUTINE TESTING W REFLEX): HIV Screen 4th Generation wRfx: NONREACTIVE

## 2017-09-04 LAB — MAGNESIUM: MAGNESIUM: 1.5 mg/dL — AB (ref 1.7–2.4)

## 2017-09-04 LAB — HEMOGLOBIN A1C
Hgb A1c MFr Bld: 6.1 % — ABNORMAL HIGH (ref 4.8–5.6)
MEAN PLASMA GLUCOSE: 128.37 mg/dL

## 2017-09-04 MED ORDER — ONDANSETRON HCL 4 MG/2ML IJ SOLN: 4.0000 mg | Freq: Four times a day (QID) | INTRAMUSCULAR | Status: DC | PRN

## 2017-09-04 MED ORDER — ENSURE ENLIVE PO LIQD
237.0000 mL | Freq: Two times a day (BID) | ORAL | Status: DC
  Administered 2017-09-04 (×2): 237 mL via ORAL

## 2017-09-04 MED ORDER — ZOLPIDEM TARTRATE 5 MG PO TABS
2.5000 mg | ORAL_TABLET | Freq: Every day | ORAL | Status: DC
  Administered 2017-09-04: 5 mg via ORAL
  Administered 2017-09-05: 2.5 mg via ORAL
  Filled 2017-09-04 (×2): qty 1

## 2017-09-04 MED ORDER — ACETAMINOPHEN 650 MG RE SUPP: 650.0000 mg | Freq: Four times a day (QID) | RECTAL | Status: DC | PRN

## 2017-09-04 MED ORDER — POTASSIUM CHLORIDE CRYS ER 20 MEQ PO TBCR
20.0000 meq | EXTENDED_RELEASE_TABLET | Freq: Two times a day (BID) | ORAL | Status: DC
  Administered 2017-09-04 – 2017-09-06 (×5): 20 meq via ORAL
  Filled 2017-09-04 (×5): qty 1

## 2017-09-04 MED ORDER — SIMVASTATIN 10 MG PO TABS
10.0000 mg | ORAL_TABLET | Freq: Every day | ORAL | Status: DC
  Administered 2017-09-04 – 2017-09-05 (×3): 10 mg via ORAL
  Filled 2017-09-04 (×3): qty 1

## 2017-09-04 MED ORDER — IOHEXOL 300 MG/ML  SOLN
100.0000 mL | Freq: Once | INTRAMUSCULAR | Status: AC | PRN
  Administered 2017-09-04: 100 mL via INTRAVENOUS

## 2017-09-04 MED ORDER — IOPAMIDOL (ISOVUE-300) INJECTION 61%
INTRAVENOUS | Status: AC
  Filled 2017-09-04: qty 30

## 2017-09-04 MED ORDER — CLONAZEPAM 1 MG PO TABS: 1.0000 mg | ORAL_TABLET | Freq: Three times a day (TID) | ORAL | Status: DC | PRN

## 2017-09-04 MED ORDER — ORAL CARE MOUTH RINSE
15.0000 mL | Freq: Two times a day (BID) | OROMUCOSAL | Status: DC
  Administered 2017-09-04 – 2017-09-05 (×2): 15 mL via OROMUCOSAL

## 2017-09-04 MED ORDER — ENOXAPARIN SODIUM 40 MG/0.4ML ~~LOC~~ SOLN
40.0000 mg | SUBCUTANEOUS | Status: DC
  Administered 2017-09-04 – 2017-09-06 (×3): 40 mg via SUBCUTANEOUS
  Filled 2017-09-04 (×2): qty 0.4

## 2017-09-04 MED ORDER — LOSARTAN POTASSIUM 50 MG PO TABS
50.0000 mg | ORAL_TABLET | Freq: Every day | ORAL | Status: DC
  Administered 2017-09-04 – 2017-09-06 (×3): 50 mg via ORAL
  Filled 2017-09-04 (×3): qty 1

## 2017-09-04 MED ORDER — FLUOXETINE HCL 20 MG PO CAPS
40.0000 mg | ORAL_CAPSULE | Freq: Every day | ORAL | Status: DC
  Administered 2017-09-04 – 2017-09-06 (×3): 40 mg via ORAL
  Filled 2017-09-04 (×3): qty 2

## 2017-09-04 MED ORDER — INSULIN ASPART 100 UNIT/ML ~~LOC~~ SOLN
0.0000 [IU] | Freq: Three times a day (TID) | SUBCUTANEOUS | Status: DC
  Administered 2017-09-05 (×2): 1 [IU] via SUBCUTANEOUS

## 2017-09-04 MED ORDER — ONDANSETRON HCL 4 MG PO TABS: 4.0000 mg | ORAL_TABLET | Freq: Four times a day (QID) | ORAL | Status: DC | PRN

## 2017-09-04 MED ORDER — MAGNESIUM SULFATE 2 GM/50ML IV SOLN
2.0000 g | Freq: Once | INTRAVENOUS | Status: AC
  Administered 2017-09-04: 2 g via INTRAVENOUS
  Filled 2017-09-04: qty 50

## 2017-09-04 MED ORDER — ACETAMINOPHEN 325 MG PO TABS
650.0000 mg | ORAL_TABLET | Freq: Four times a day (QID) | ORAL | Status: DC | PRN
  Administered 2017-09-04 – 2017-09-06 (×3): 650 mg via ORAL
  Filled 2017-09-04 (×3): qty 2

## 2017-09-04 MED ORDER — CLONIDINE HCL 0.1 MG PO TABS
0.1000 mg | ORAL_TABLET | Freq: Two times a day (BID) | ORAL | Status: DC
  Administered 2017-09-04 – 2017-09-06 (×5): 0.1 mg via ORAL
  Filled 2017-09-04 (×5): qty 1

## 2017-09-04 MED ORDER — ALBUTEROL SULFATE (2.5 MG/3ML) 0.083% IN NEBU: 2.5000 mg | INHALATION_SOLUTION | Freq: Four times a day (QID) | RESPIRATORY_TRACT | Status: DC | PRN

## 2017-09-04 MED ORDER — SODIUM CHLORIDE 0.9 % IV SOLN
INTRAVENOUS | Status: DC
  Administered 2017-09-04 – 2017-09-06 (×5): via INTRAVENOUS

## 2017-09-04 MED ORDER — INSULIN ASPART 100 UNIT/ML ~~LOC~~ SOLN: 0.0000 [IU] | Freq: Every day | SUBCUTANEOUS | Status: DC

## 2017-09-04 MED ORDER — LIP MEDEX EX OINT
TOPICAL_OINTMENT | CUTANEOUS | Status: DC | PRN
  Filled 2017-09-04: qty 7

## 2017-09-04 MED ORDER — POTASSIUM CHLORIDE 10 MEQ/100ML IV SOLN
10.0000 meq | INTRAVENOUS | Status: AC
  Administered 2017-09-04 (×3): 10 meq via INTRAVENOUS
  Filled 2017-09-04 (×3): qty 100

## 2017-09-04 MED ORDER — POTASSIUM CHLORIDE CRYS ER 20 MEQ PO TBCR
20.0000 meq | EXTENDED_RELEASE_TABLET | ORAL | Status: AC
  Administered 2017-09-04: 20 meq via ORAL
  Filled 2017-09-04: qty 1

## 2017-09-04 NOTE — Progress Notes (Signed)
[facility]                                                                              Patient Demographics  Sarah [name], is a 67 y.o. female, DOB - [birth year], UJW:[id number]  Admit date - 09/03/2017   Admitting Physician Norval Morton, MD  Outpatient Primary MD for the patient is Baxley, Cresenciano Lick, MD  Outpatient specialists:   LOS - 0  days   Medical records reviewed and are as summarized below:    Chief Complaint  Patient presents with  . Fall  . Nausea  . Weakness       Brief summary   Sarah [name] is a 67 y.o. female with medical history significant of  metastatic melanoma followed Dr. Raynelle Chary at [facility], HTN, HLD, diabetes mellitus type 2, anxiety, presented with 3 weeks history of nausea, vomiting, diarrhea with generalized weakness.  She reports being unable to tolerate any food.  She feels like food does not even get down her throat prior to her regurgitating it up.  Emesis is nonbloody and nonbilious in appearance.  She also complains of having 6-7 episodes of nonbloody diarrhea per day.  Associated symptoms include intermittent subjective fevers, chills, dry cough, intermittent palpitations, 60 pound weight loss over last 4 months, falls, anxiety, depression, lower extremity swelling, and shortness of breath with exertion.  The torsemide  that she takes for high blood pressure was recently increased because of lower extremity swelling.  Denies having any abdominal pain, dysuria. Patient had been on biweekly infusions of nivolumab up until 6/26.  However, reported developing a rash which treatments have been on hold.    Assessment & Plan    Principal Problem:   Nausea, vomiting, and diarrhea -Unclear etiology, patient reports persistent symptoms over the last 3 weeks -UA negative for UTI, follow C. difficile, GI pathogen panel -Abdominal x-ray negative -Obtain CT abdomen for further work-up, rule out colitis -Continue IV fluids, antiemetics  Active  Problems: Failure to thrive with weight loss -Patient has a history of melanoma, obtain CT abdomen pelvis.  If any progression noted, will obtain CT chest and consult oncology -PT OT evaluation  Hypokalemia, hypomagnesemia -Replaced IV  Prolonged QTC -Correct electrolytes, will recheck EKG  Mild acute kidney injury -Presented with creatinine of 1.09, improved to 0.8 -Holding diuretics.  History of metastatic melanoma -Currently followed by Dr. Raynelle Chary at [facility], has been on infusions of nivolumab weekly, last treatment on 6/26 -If progression of melanoma noted, will consult oncology  Essential hypertension -Continue clonidine, losartan, hold torsemide for now  Diabetes mellitus type 2 -Hemoglobin A1c 6.1 on 8/17 -Continue carb modified diet  Hyperlipidemia -Continue simvastatin   Anxiety/depression -Continue Prozac, Klonopin as needed   Code Status: Full CODE STATUS DVT Prophylaxis:  Lovenox  Family Communication: Discussed in detail with the patient, all imaging results, lab results explained to the patient   Disposition Plan: When medically ready  Time Spent in minutes:35 minutes  Procedures:  None  Consultants:   None  Antimicrobials:      Medications  Scheduled Meds: . cloNIDine  0.1 mg Oral BID  . enoxaparin (LOVENOX)  injection  40 mg Subcutaneous Q24H  . feeding supplement (ENSURE ENLIVE)  237 mL Oral BID BM  . FLUoxetine  40 mg Oral Daily  . insulin aspart  0-5 Units Subcutaneous QHS  . insulin aspart  0-9 Units Subcutaneous TID WC  . losartan  50 mg Oral Daily  . mouth rinse  15 mL Mouth Rinse BID  . potassium chloride SA  20 mEq Oral BID  . simvastatin  10 mg Oral QHS  . zolpidem  2.5-5 mg Oral QHS   Continuous Infusions: . sodium chloride Stopped (09/04/17 0553)   PRN Meds:.acetaminophen **OR** acetaminophen, albuterol, clonazePAM, lip balm   Antibiotics   Anti-infectives (From admission, onward)   None        Subjective:    Haniyah Maciolek was seen and examined today.  Still feels weak, no diarrhea this morning.  Patient denies dizziness, chest pain, shortness of breath, new weakness, numbess, tingling.  Low-grade temp of 99.2 F.    Objective:   Vitals:   09/03/17 1857 09/03/17 1921 09/03/17 2329 09/04/17 0308  BP: 113/81 108/72 (!) 123/98 (!) 135/98  Pulse:  81 79 80  Resp: 20 15 16 20   Temp:  99.2 F (37.3 C) 98.4 F (36.9 C) 98.4 F (36.9 C)  TempSrc:  Oral Oral Oral  SpO2:  95% 95% 97%  Weight:    85 kg  Height:    5\' 5"  (1.651 m)    Intake/Output Summary (Last 24 hours) at 09/04/2017 1245 Last data filed at 09/04/2017 0600 Gross per 24 hour  Intake 1247.12 ml  Output -  Net 1247.12 ml     Wt Readings from Last 3 Encounters:  09/04/17 85 kg  08/11/16 102.1 kg  07/20/16 103.4 kg     Exam  General: Alert and oriented x 3, NAD  Eyes:  HEENT:  Atraumatic, normocephalic  Cardiovascular: S1 S2 auscultated, Regular rate and rhythm.  respiratory: Clear to auscultation bilaterally, no wheezing, rales or rhonchi  Gastrointestinal: Soft, nontender, nondistended, + bowel sounds  Ext: no pedal edema bilaterally  Neuro: no new deficits  Musculoskeletal: No digital cyanosis, clubbing  Skin: No rashes  Psych: Normal affect and demeanor, alert and oriented x3    Data Reviewed:  I have personally reviewed following labs and imaging studies  Micro Results No results found for this or any previous visit (from the past 240 hour(s)).  Radiology Reports Dg Chest 2 View  Result Date: 09/03/2017 CLINICAL DATA:  Metastatic melanoma.  Nausea and vomiting EXAM: CHEST - 2 VIEW COMPARISON:  Chest radiograph Jun 03, 2011; chest CT March 07, 2015 FINDINGS: Lungs are clear. Heart size and pulmonary vascularity are normal. No evident adenopathy. No bone lesions are appreciable. IMPRESSION: No neoplastic focus evident.  Lungs clear.  No evident adenopathy. Electronically Signed   By: Lowella Grip III M.D.   On: 09/03/2017 17:22   Ct Head Wo Contrast  Result Date: 09/03/2017 CLINICAL DATA:  67 y/o F; progressive weakness recent fall. History of stage IV melanoma. EXAM: CT HEAD WITHOUT CONTRAST TECHNIQUE: Contiguous axial images were obtained from the base of the skull through the vertex without intravenous contrast. COMPARISON:  None. FINDINGS: Brain: No evidence of acute infarction, hemorrhage, hydrocephalus, extra-axial collection or mass lesion/mass effect. Vascular: Mild calcific atherosclerosis of the carotid siphons. No hyperdense vessel identified. Skull: Normal. Negative for fracture or focal lesion. Sinuses/Orbits: No acute finding. Other: None. IMPRESSION: Stable negative CT of the head. Electronically Signed   By: Mia Creek  F[place]-Stratton M.D.   On: 09/03/2017 17:41   Dg Abd Acute W/chest  Result Date: 09/04/2017 CLINICAL DATA:  Patient with nausea, vomiting and diarrhea. EXAM: DG ABDOMEN ACUTE W/ 1V CHEST COMPARISON:  Chest radiograph 09/03/2017 FINDINGS: Monitoring leads overlie the patient. Stable cardiac and mediastinal contours. No consolidative pulmonary opacities. Gas is demonstrated within nondilated loops of large and small bowel. No evidence for free intraperitoneal air. Cholecystectomy clips. Lumbar spinal fusion hardware. IMPRESSION: No acute cardiopulmonary process. Nonobstructed bowel gas pattern. Electronically Signed   By: Lovey Newcomer M.D.   On: 09/04/2017 02:30    Lab Data:  CBC: Recent Labs  Lab 09/03/17 1623 09/04/17 0308  WBC 6.3 5.6  HGB 14.9 12.7  HCT 41.9 37.3  MCV 87.1 88.2  PLT 317 409   Basic Metabolic Panel: Recent Labs  Lab 09/03/17 1623 09/04/17 0308  NA 137 137  K 3.4* 2.8*  CL 95* 101  CO2 26 25  GLUCOSE 158* 122*  BUN 12 10  CREATININE 1.09* 0.89  CALCIUM 9.1 8.4*  MG  --  1.5*   GFR: Estimated Creatinine Clearance: 66.9 mL/min (by C-G formula based on SCr of 0.89 mg/dL). Liver Function Tests: Recent Labs  Lab  09/03/17 1623  AST 19  ALT 19  ALKPHOS 69  BILITOT 1.1  PROT 6.7  ALBUMIN 3.7   Recent Labs  Lab 09/04/17 0308  LIPASE 27   No results for input(s): AMMONIA in the last 168 hours. Coagulation Profile: No results for input(s): INR, PROTIME in the last 168 hours. Cardiac Enzymes: Recent Labs  Lab 09/03/17 1623  TROPONINI <0.03   BNP (last 3 results) No results for input(s): PROBNP in the last 8760 hours. HbA1C: Recent Labs    09/04/17 0308  HGBA1C 6.1*   CBG: Recent Labs  Lab 09/04/17 0246 09/04/17 0742 09/04/17 1230  GLUCAP 123* 107* 93   Lipid Profile: No results for input(s): CHOL, HDL, LDLCALC, TRIG, CHOLHDL, LDLDIRECT in the last 72 hours. Thyroid Function Tests: Recent Labs    09/04/17 0308  TSH 1.673   Anemia Panel: No results for input(s): VITAMINB12, FOLATE, FERRITIN, TIBC, IRON, RETICCTPCT in the last 72 hours. Urine analysis:    Component Value Date/Time   COLORURINE YELLOW 09/03/2017 2314   APPEARANCEUR CLEAR 09/03/2017 2314   APPEARANCEUR Cloudy 06/03/2011 0945   LABSPEC 1.016 09/03/2017 2314   LABSPEC 1.025 06/03/2011 0945   PHURINE 5.0 09/03/2017 2314   GLUCOSEU NEGATIVE 09/03/2017 2314   GLUCOSEU Negative 06/03/2011 0945   HGBUR NEGATIVE 09/03/2017 [place] 09/03/2017 2314   BILIRUBINUR negative 07/20/2016 1019   BILIRUBINUR Negative 06/03/2011 0945   KETONESUR 5 (A) 09/03/2017 2314   PROTEINUR NEGATIVE 09/03/2017 2314   UROBILINOGEN 0.2 07/20/2016 1019   UROBILINOGEN 0.2 08/23/2010 1709   NITRITE NEGATIVE 09/03/2017 2314   LEUKOCYTESUR NEGATIVE 09/03/2017 2314   LEUKOCYTESUR Negative 06/03/2011 0945     Bellina Tokarczyk M.D. [facility] 09/04/2017, 12:45 PM  Pager: [phone] Between 7am to 7pm - call Pager - [phone]  After 7pm go to [email or web address] - password [email or web address]  Call night coverage person covering after 7pm

## 2017-09-04 NOTE — ED Notes (Signed)
ED TO INPATIENT HANDOFF REPORT  Name/Age/Gender Sarah [name] 67 y.o. female  Code Status    Code Status Orders  (From admission, onward)         Start     Ordered   09/04/17 0055  Full code  Continuous     09/04/17 0058        Code Status History    This patient has a current code status but no historical code status.    Advance Directive Documentation     Most Recent Value  Type of Advance Directive  Living will, Healthcare Power of Attorney  Pre-existing out of facility DNR order (yellow form or pink MOST form)  -  "MOST" Form in Place?  -      Home/SNF/Other Home  Chief Complaint Fall (2 days ago), emesis  Level of Care/Admitting Diagnosis ED Disposition    ED Disposition Condition [place]: [place] [100102]  Level of Care: Med-Surg [16]  Diagnosis: Failure to thrive in adult [358490]  Admitting Physician: Norval Morton [[id number]]  Attending Physician: Norval Morton [[id number]]  PT Class (Do Not Modify): Observation [104]  PT Acc Code (Do Not Modify): Observation [10022]       Medical History Past Medical History:  Diagnosis Date  . Anxiety   . Constipation   . Dependent edema   . Depression   . Diabetes mellitus   . Dysmenorrhea   . H/O: suicide attempt   . Hyperlipidemia   . Hypertension   . Insomnia   . PMS (premenstrual syndrome)     Allergies Allergies  Allergen Reactions  . Doxycycline Rash  . Ciprofloxacin Rash    "very bad rash"  . Penicillins Rash    Has patient had a PCN reaction causing immediate rash, facial/tongue/throat swelling, SOB or lightheadedness with hypotension: No Has patient had a PCN reaction causing severe rash involving mucus membranes or skin necrosis: No Has patient had a PCN reaction that required hospitalization: No Has patient had a PCN reaction occurring within the last 10 years: No If all of the above answers are "NO", then may proceed with Cephalosporin  use.     IV Location/Drains/Wounds Patient Lines/Drains/Airways Status   Active Line/Drains/Airways    Name:   Placement date:   Placement time:   Site:   Days:   Peripheral IV 09/03/17 Right Forearm   09/03/17    1651    Forearm   1          Labs/Imaging Results for orders placed or performed during the hospital encounter of 09/03/17 (from the past 48 hour(s))  CBC     Status: None   Collection Time: 09/03/17  4:23 PM  Result Value Ref Range   WBC 6.3 4.0 - 10.5 K/uL   RBC 4.81 3.87 - 5.11 MIL/uL   Hemoglobin 14.9 12.0 - 15.0 g/dL   HCT 41.9 36.0 - 46.0 %   MCV 87.1 78.0 - 100.0 fL   MCH 31.0 26.0 - 34.0 pg   MCHC 35.6 30.0 - 36.0 g/dL   RDW 12.7 11.5 - 15.5 %   Platelets 317 150 - 400 K/uL    Comment: Performed at [facility], [place] [address] [place], [place] [address]  Comprehensive metabolic panel     Status: Abnormal   Collection Time: 09/03/17  4:23 PM  Result Value Ref Range   Sodium 137 135 - 145 mmol/L   Potassium 3.4 (L)  3.5 - 5.1 mmol/L   Chloride 95 (L) 98 - 111 mmol/L   CO2 26 22 - 32 mmol/L   Glucose, Bld 158 (H) 70 - 99 mg/dL   BUN 12 8 - 23 mg/dL   Creatinine, Ser 1.09 (H) 0.44 - 1.00 mg/dL   Calcium 9.1 8.9 - 10.3 mg/dL   Total Protein 6.7 6.5 - 8.1 g/dL   Albumin 3.7 3.5 - 5.0 g/dL   AST 19 15 - 41 U/L   ALT 19 0 - 44 U/L   Alkaline Phosphatase 69 38 - 126 U/L   Total Bilirubin 1.1 0.3 - 1.2 mg/dL   GFR calc non Af Amer 52 (L) >60 mL/min   GFR calc Af Amer 60 (L) >60 mL/min    Comment: (NOTE) The eGFR has been calculated using the CKD EPI equation. This calculation has not been validated in all clinical situations. eGFR's persistently <60 mL/min signify possible Chronic Kidney Disease.    Anion gap 16 (H) 5 - 15    Comment: Performed at [facility], [place] [address] [place], [place] [address]  Brain natriuretic peptide     Status: None   Collection Time: 09/03/17  4:23 PM  Result Value Ref Range   B  Natriuretic Peptide 23.2 0.0 - 100.0 pg/mL    Comment: Performed at [facility], [place] [address] [place], [place] [address]  Troponin I     Status: None   Collection Time: 09/03/17  4:23 PM  Result Value Ref Range   Troponin I <0.03 <0.03 ng/mL    Comment: Performed at [facility], [place] [address], [place], [place] [address]  Urinalysis, Routine w reflex microscopic     Status: Abnormal   Collection Time: 09/03/17 11:14 PM  Result Value Ref Range   Color, Urine YELLOW YELLOW   APPearance CLEAR CLEAR   Specific Gravity, Urine 1.016 1.005 - 1.030   pH 5.0 5.0 - 8.0   Glucose, UA NEGATIVE NEGATIVE mg/dL   Hgb urine dipstick NEGATIVE NEGATIVE   Bilirubin Urine NEGATIVE NEGATIVE   Ketones, ur 5 (A) NEGATIVE mg/dL   Protein, ur NEGATIVE NEGATIVE mg/dL   Nitrite NEGATIVE NEGATIVE   Leukocytes, UA NEGATIVE NEGATIVE    Comment: Performed at [place] [address] [place], [place] [address]   Dg Chest 2 View  Result Date: 09/03/2017 CLINICAL DATA:  Metastatic melanoma.  Nausea and vomiting EXAM: CHEST - 2 VIEW COMPARISON:  Chest radiograph Jun 03, 2011; chest CT March 07, 2015 FINDINGS: Lungs are clear. Heart size and pulmonary vascularity are normal. No evident adenopathy. No bone lesions are appreciable. IMPRESSION: No neoplastic focus evident.  Lungs clear.  No evident adenopathy. Electronically Signed   By: Lowella Grip III M.D.   On: 09/03/2017 17:22   Ct Head Wo Contrast  Result Date: 09/03/2017 CLINICAL DATA:  67 y/o F; progressive weakness recent fall. History of stage IV melanoma. EXAM: CT HEAD WITHOUT CONTRAST TECHNIQUE: Contiguous axial images were obtained from the base of the skull through the vertex without intravenous contrast. COMPARISON:  None. FINDINGS: Brain: No evidence of acute infarction, hemorrhage, hydrocephalus, extra-axial collection or mass lesion/mass effect. Vascular: Mild calcific atherosclerosis  of the carotid siphons. No hyperdense vessel identified. Skull: Normal. Negative for fracture or focal lesion. Sinuses/Orbits: No acute finding. Other: None. IMPRESSION: Stable negative CT of the head. Electronically Signed   By: Kristine Garbe M.D.   On: 09/03/2017 17:41  Pending Labs Unresulted Labs (From admission, onward)    Start     Ordered   09/04/17 0500  HIV antibody (Routine Testing)  Tomorrow morning,   R     09/04/17 0058   09/04/17 0500  Hemoglobin A1c  Tomorrow morning,   R     09/04/17 0058   09/04/17 0500  CBC  Tomorrow morning,   R     09/04/17 0058   09/04/17 7159  Basic metabolic panel  Tomorrow morning,   R     09/04/17 0058   09/04/17 0136  C difficile quick scan w PCR reflex  (C Difficile quick screen w PCR reflex panel)  Once, for 24 hours,   R     09/04/17 0136   09/04/17 0136  Gastrointestinal Panel by PCR , Stool  (Gastrointestinal Panel by PCR, Stool)  Once,   R     09/04/17 0136   09/04/17 0105  TSH  Add-on,   R     09/04/17 0104   09/04/17 0056  Lipase, blood  Add-on,   R     09/04/17 0058          Vitals/Pain Today's Vitals   09/03/17 1844 09/03/17 1857 09/03/17 1921 09/03/17 2329  BP: (!) 120/50 113/81 108/72 (!) 123/98  Pulse:   81 79  Resp: '15 20 15 16  ' Temp:   99.2 F (37.3 C) 98.4 F (36.9 C)  TempSrc:   Oral Oral  SpO2:   95% 95%  Weight:      Height:      PainSc:        Isolation Precautions Enteric precautions (UV disinfection)  Medications Medications  cloNIDine (CATAPRES) tablet 0.1 mg (has no administration in time range)  losartan (COZAAR) tablet 50 mg (has no administration in time range)  simvastatin (ZOCOR) tablet 10 mg (has no administration in time range)  clonazePAM (KLONOPIN) tablet 1 mg (has no administration in time range)  zolpidem (AMBIEN) tablet 2.5-5 mg (has no administration in time range)  FLUoxetine (PROZAC) capsule 40 mg (has no administration in time range)  enoxaparin (LOVENOX) injection 40  mg (has no administration in time range)  potassium chloride SA (K-DUR,KLOR-CON) CR tablet 20 mEq (has no administration in time range)  0.9 %  sodium chloride infusion (has no administration in time range)  ondansetron (ZOFRAN) tablet 4 mg (has no administration in time range)    Or  ondansetron (ZOFRAN) injection 4 mg (has no administration in time range)  acetaminophen (TYLENOL) tablet 650 mg (has no administration in time range)    Or  acetaminophen (TYLENOL) suppository 650 mg (has no administration in time range)  albuterol (PROVENTIL) (2.5 MG/3ML) 0.083% nebulizer solution 2.5 mg (has no administration in time range)  insulin aspart (novoLOG) injection 0-9 Units (has no administration in time range)  insulin aspart (novoLOG) injection 0-5 Units (has no administration in time range)  sodium chloride 0.9 % bolus 1,000 mL (0 mLs Intravenous Stopped 09/03/17 1813)  ondansetron (ZOFRAN) injection 4 mg (4 mg Intravenous Given 09/03/17 1652)  prochlorperazine (COMPAZINE) injection 5 mg (5 mg Intravenous Given 09/03/17 1653)  diphenhydrAMINE (BENADRYL) injection 12.5 mg (12.5 mg Intravenous Given 09/03/17 1653)  sodium chloride 0.9 % bolus 1,000 mL (0 mLs Intravenous Stopped 09/03/17 2157)    Mobility Walks

## 2017-09-04 NOTE — Evaluation (Signed)
Physical Therapy Evaluation Patient Details Name: Sarah [name] MRN: [id number] DOB: 1950/04/09 Today's Date: 09/04/2017   History of Present Illness  Patient is a 67 y/o female presenting to the ED on 09/03/17 with nausea, vomiting, and diarrhea. PMH  significant ofmetastatic melanoma followed Dr. Margarita Mail, HTN, HLD, diabetes mellitus type 2, anxiety, depression, history of suicide attempt. Continued work-up.    Clinical Impression  [name] is a very pleasant 67 y/o female admitted with the above listed diagnosis. Patient reports that prior to admission she was independent with all mobility, ADLs, and IADLs. Patient today requiring general min guard assist for mobility for general safety - no physical assist needed for stability. Patient currently using RW for mobility hopeful for progression to no AD. Will assess stair navigation at next visit. PT to follow acutely to maximize functional mobility prior to d/c.    Follow Up Recommendations No PT follow up    Equipment Recommendations  Rolling walker with 5" wheels(will continue to assess need for AD - hopeful will not need)    Recommendations for Other Services       Precautions / Restrictions Precautions Precautions: Fall Restrictions Weight Bearing Restrictions: No      Mobility  Bed Mobility Overal bed mobility: Modified Independent                Transfers Overall transfer level: Needs assistance Equipment used: Rolling walker (2 wheeled) Transfers: Sit to/from Stand Sit to Stand: Supervision         General transfer comment: for safety and immedaite standing balance  Ambulation/Gait Ambulation/Gait assistance: Min guard Gait Distance (Feet): 100 Feet Assistive device: Rolling walker (2 wheeled) Gait Pattern/deviations: Step-through pattern;Decreased stride length Gait velocity: decreased   General Gait Details: min guard for general safety - does not require physical assist. limited by  fatigue  Stairs            Wheelchair Mobility    Modified Rankin (Stroke Patients Only)       Balance Overall balance assessment: Mild deficits observed, not formally tested                                           Pertinent Vitals/Pain Pain Assessment: No/denies pain    Home Living Family/patient expects to be discharged to:: Private residence Living Arrangements: Alone   Type of Home: House Home Access: Level entry     Home Layout: Two level Home Equipment: None      Prior Function Level of Independence: Independent               Hand Dominance        Extremity/Trunk Assessment        Lower Extremity Assessment Lower Extremity Assessment: Generalized weakness    Cervical / Trunk Assessment Cervical / Trunk Assessment: Normal  Communication   Communication: No difficulties  Cognition Arousal/Alertness: Awake/alert Behavior During Therapy: WFL for tasks assessed/performed Overall Cognitive Status: Within Functional Limits for tasks assessed                                        General Comments      Exercises     Assessment/Plan    PT Assessment Patient needs continued PT services  PT Problem List Decreased strength;Decreased balance;Decreased activity tolerance;Decreased  mobility;Decreased knowledge of use of DME;Decreased safety awareness       PT Treatment Interventions DME instruction;Gait training;Stair training;Functional mobility training;Therapeutic activities;Therapeutic exercise;Balance training;Patient/family education    PT Goals (Current goals can be found in the Care Plan section)  Acute Rehab PT Goals Patient Stated Goal: regain strength PT Goal Formulation: With patient Time For Goal Achievement: 09/18/17 Potential to Achieve Goals: Good    Frequency Min 3X/week   Barriers to discharge        Co-evaluation               AM-PAC PT "6 Clicks" Daily Activity   Outcome Measure Difficulty turning over in bed (including adjusting bedclothes, sheets and blankets)?: A Little Difficulty moving from lying on back to sitting on the side of the bed? : A Little Difficulty sitting down on and standing up from a chair with arms (e.g., wheelchair, bedside commode, etc,.)?: A Little Help needed moving to and from a bed to chair (including a wheelchair)?: A Little Help needed walking in hospital room?: A Little Help needed climbing 3-5 steps with a railing? : A Little 6 Click Score: 18    End of Session Equipment Utilized During Treatment: Gait belt Activity Tolerance: Patient tolerated treatment well Patient left: in chair;with call bell/phone within reach Nurse Communication: Mobility status PT Visit Diagnosis: Unsteadiness on feet (R26.81);Other abnormalities of gait and mobility (R26.89);Muscle weakness (generalized) (M62.81)    Time: 0929-5747 PT Time Calculation (min) (ACUTE ONLY): 22 min   Charges:   PT Evaluation $PT Eval Moderate Complexity: 1 Mod         Lanney Gins, PT, DPT 09/04/17 2:34 PM Pager: [phone]

## 2017-09-04 NOTE — H&P (Signed)
History and Physical    Sarah [name] RWE:[id number] DOB: 01-Jan-1951 DOA: 09/03/2017  Referring MD/NP/PA: Kristeen Miss, MD PCP: Elby Showers, MD  Patient coming from: Home  Chief Complaint: Nausea, vomiting, and diarrhea  I have personally briefly reviewed patient's old medical records in [place]   HPI: Sarah [name] is a 67 y.o. female with medical history significant of  metastatic melanoma followed Dr. Raynelle Chary at [facility], HTN, HLD, diabetes mellitus type 2, anxiety, depression, history of suicide attempt; who presents with planes of 3 weeks of nausea, vomiting, diarrhea, with generalized weakness.  She reports being unable to tolerate any food.  She feels like food does not even get down her throat prior to her regurgitating it up.  Emesis is nonbloody and nonbilious in appearance.  She also complains of having 6-7 episodes of nonbloody diarrhea per day.  Associated symptoms include intermittent subjective fevers, chills, dry cough, intermittent palpitations, 60 pound weight loss over last 4 months, falls, anxiety, depression, lower extremity swelling, and shortness of breath with exertion.  The torsemide  that she takes for high blood pressure was recently increased because of lower extremity swelling.  Denies having any abdominal pain, dysuria.   Patient had been on biweekly infusions of nivolumab up until 6/26.  However, reported developing a rash which treatments have been on hold.   ED Course: Vital signs were noted to be relatively within normal limits.  Labs revealed normal CBC, potassium 3.4, BUN 12, creatinine 1.09, and glucose 158.  Review of Systems  Constitutional: Positive for chills, fever, malaise/fatigue and weight loss.  HENT: Negative for congestion and nosebleeds.   Eyes: Negative for pain.  Respiratory: Positive for cough and shortness of breath. Negative for sputum production.   Cardiovascular: Positive for palpitations and leg swelling. Negative for chest pain.    Gastrointestinal: Positive for diarrhea, nausea and vomiting. Negative for abdominal pain and blood in stool.  Genitourinary: Negative for dysuria and frequency.  Musculoskeletal: Positive for back pain and falls.  Neurological: Positive for weakness.  Psychiatric/Behavioral: Negative for substance abuse. The patient is nervous/anxious.     Past Medical History:  Diagnosis Date  . Anxiety   . Constipation   . Dependent edema   . Depression   . Diabetes mellitus   . Dysmenorrhea   . H/O: suicide attempt   . Hyperlipidemia   . Hypertension   . Insomnia   . PMS (premenstrual syndrome)     Past Surgical History:  Procedure Laterality Date  . ABDOMINAL HYSTERECTOMY    . BREAST BIOPSY Right    benign  . CHOLECYSTECTOMY    . DILATION AND CURETTAGE OF UTERUS    . LUMBAR FUSION    . TUBAL LIGATION       reports that she has never smoked. She has never used smokeless tobacco. Her alcohol and drug histories are not on file.  Allergies  Allergen Reactions  . Doxycycline Rash  . Ciprofloxacin Rash    "very bad rash"  . Penicillins Rash    Has patient had a PCN reaction causing immediate rash, facial/tongue/throat swelling, SOB or lightheadedness with hypotension: No Has patient had a PCN reaction causing severe rash involving mucus membranes or skin necrosis: No Has patient had a PCN reaction that required hospitalization: No Has patient had a PCN reaction occurring within the last 10 years: No If all of the above answers are "NO", then may proceed with Cephalosporin use.     Family History  Problem Relation  Age of Onset  . Diabetes Mother   . Mental illness Mother     Prior to Admission medications   Medication Sig Start Date End Date Taking? Authorizing Provider  clonazePAM (KLONOPIN) 1 MG tablet Take 1 mg by mouth 3 (three) times daily as needed for anxiety.  08/24/13  Yes [provider]  cloNIDine (CATAPRES) 0.1 MG tablet Take 1 tablet (0.1 mg total) by  mouth 2 (two) times daily. 05/27/15  Yes Baxley, Cresenciano Lick, MD  ergocalciferol (VITAMIN D2) 50000 units capsule Take 1 weekly for 12 weeks 05/24/15  Yes Elby Showers, MD  FLUoxetine (PROZAC) 40 MG capsule Take 40 mg by mouth daily. 07/02/17  Yes [provider]  gabapentin (NEURONTIN) 300 MG capsule Take 300 mg by mouth 3 (three) times daily. 07/23/17  Yes [provider]  losartan (COZAAR) 50 MG tablet TAKE 1 TABLET BY MOUTH DAILY 06/21/17  Yes Baxley, Cresenciano Lick, MD  potassium chloride SA (K-DUR,KLOR-CON) 20 MEQ tablet Take 20 mEq by mouth 2 (two) times daily.     Yes [provider]  simvastatin (ZOCOR) 10 MG tablet TAKE ONE TABLET BY MOUTH EVERY NIGHT AT BEDTIME 03/26/17  Yes Elby Showers, MD  torsemide (DEMADEX) 20 MG tablet TAKE 1 TABLET BY MOUTH TWICE A DAY 04/15/17  Yes Baxley, Cresenciano Lick, MD  triamcinolone cream (KENALOG) 0.1 % Apply 1 application topically 2 (two) times daily. 08/19/17  Yes [provider]  vitamin B-12 (CYANOCOBALAMIN) 1000 MCG tablet Take 1,000 mcg by mouth daily.   Yes [provider]  zolpidem (AMBIEN) 10 MG tablet Take 2.5-5 mg by mouth at bedtime. Alternate 2.5mg  and 5mg  QOD   Yes [provider]    Physical Exam:  Constitutional: NAD, calm, comfortable Vitals:   09/03/17 1844 09/03/17 1857 09/03/17 1921 09/03/17 2329  BP: (!) 120/50 113/81 108/72 (!) 123/98  Pulse:   81 79  Resp: 15 20 15 16   Temp:   99.2 F (37.3 C) 98.4 F (36.9 C)  TempSrc:   Oral Oral  SpO2:   95% 95%  Weight:      Height:       Eyes: PERRL, lids and conjunctivae normal ENMT: Mucous membranes are moist. Posterior pharynx clear of any exudate or lesions.Normal dentition.  Neck: normal, supple, no masses, no thyromegaly Respiratory: clear to auscultation bilaterally, no wheezing, no crackles. Normal respiratory effort. No accessory muscle use.  Cardiovascular: Regular rate and rhythm, no murmurs / rubs / gallops. No extremity edema. 2+ pedal pulses.  No carotid bruits.  Abdomen: no tenderness, no masses palpated. No hepatosplenomegaly. Bowel sounds positive.  Musculoskeletal: no clubbing / cyanosis. No joint deformity upper and lower extremities. Good ROM, no contractures. Normal muscle tone.  Skin: no rashes, lesions, ulcers. No induration Neurologic: CN 2-12 grossly intact. Sensation intact, DTR normal. Strength 5/5 in all 4.  Psychiatric: Normal judgment and insight. Alert and oriented x 3. Normal mood.     Labs on Admission: I have personally reviewed following labs and imaging studies  CBC: Recent Labs  Lab 09/03/17 1623  WBC 6.3  HGB 14.9  HCT 41.9  MCV 87.1  PLT 093   Basic Metabolic Panel: Recent Labs  Lab 09/03/17 1623  NA 137  K 3.4*  CL 95*  CO2 26  GLUCOSE 158*  BUN 12  CREATININE 1.09*  CALCIUM 9.1   GFR: Estimated Creatinine Clearance: 53.5 mL/min (A) (by C-G formula based on SCr of 1.09 mg/dL (H)). Liver Function  Tests: Recent Labs  Lab 09/03/17 1623  AST 19  ALT 19  ALKPHOS 69  BILITOT 1.1  PROT 6.7  ALBUMIN 3.7   No results for input(s): LIPASE, AMYLASE in the last 168 hours. No results for input(s): AMMONIA in the last 168 hours. Coagulation Profile: No results for input(s): INR, PROTIME in the last 168 hours. Cardiac Enzymes: Recent Labs  Lab 09/03/17 1623  TROPONINI <0.03   BNP (last 3 results) No results for input(s): PROBNP in the last 8760 hours. HbA1C: No results for input(s): HGBA1C in the last 72 hours. CBG: No results for input(s): GLUCAP in the last 168 hours. Lipid Profile: No results for input(s): CHOL, HDL, LDLCALC, TRIG, CHOLHDL, LDLDIRECT in the last 72 hours. Thyroid Function Tests: No results for input(s): TSH, T4TOTAL, FREET4, T3FREE, THYROIDAB in the last 72 hours. Anemia Panel: No results for input(s): VITAMINB12, FOLATE, FERRITIN, TIBC, IRON, RETICCTPCT in the last 72 hours. Urine analysis:    Component Value Date/Time   COLORURINE YELLOW 09/03/2017  2314   APPEARANCEUR CLEAR 09/03/2017 2314   APPEARANCEUR Cloudy 06/03/2011 0945   LABSPEC 1.016 09/03/2017 2314   LABSPEC 1.025 06/03/2011 0945   PHURINE 5.0 09/03/2017 2314   GLUCOSEU NEGATIVE 09/03/2017 2314   GLUCOSEU Negative 06/03/2011 0945   HGBUR NEGATIVE 09/03/2017 2314   BILIRUBINUR NEGATIVE 09/03/2017 2314   BILIRUBINUR negative 07/20/2016 1019   BILIRUBINUR Negative 06/03/2011 0945   KETONESUR 5 (A) 09/03/2017 2314   PROTEINUR NEGATIVE 09/03/2017 2314   UROBILINOGEN 0.2 07/20/2016 1019   UROBILINOGEN 0.2 08/23/2010 1709   NITRITE NEGATIVE 09/03/2017 2314   LEUKOCYTESUR NEGATIVE 09/03/2017 2314   LEUKOCYTESUR Negative 06/03/2011 0945   Sepsis Labs: No results found for this or any previous visit (from the past 240 hour(s)).   Radiological Exams on Admission: Dg Chest 2 View  Result Date: 09/03/2017 CLINICAL DATA:  Metastatic melanoma.  Nausea and vomiting EXAM: CHEST - 2 VIEW COMPARISON:  Chest radiograph Jun 03, 2011; chest CT March 07, 2015 FINDINGS: Lungs are clear. Heart size and pulmonary vascularity are normal. No evident adenopathy. No bone lesions are appreciable. IMPRESSION: No neoplastic focus evident.  Lungs clear.  No evident adenopathy. Electronically Signed   By: Lowella Grip III M.D.   On: 09/03/2017 17:22   Ct Head Wo Contrast  Result Date: 09/03/2017 CLINICAL DATA:  67 y/o F; progressive weakness recent fall. History of stage IV melanoma. EXAM: CT HEAD WITHOUT CONTRAST TECHNIQUE: Contiguous axial images were obtained from the base of the skull through the vertex without intravenous contrast. COMPARISON:  None. FINDINGS: Brain: No evidence of acute infarction, hemorrhage, hydrocephalus, extra-axial collection or mass lesion/mass effect. Vascular: Mild calcific atherosclerosis of the carotid siphons. No hyperdense vessel identified. Skull: Normal. Negative for fracture or focal lesion. Sinuses/Orbits: No acute finding. Other: None. IMPRESSION: Stable  negative CT of the head. Electronically Signed   By: Kristine Garbe M.D.   On: 09/03/2017 17:41    EKG: Independently reviewed.  Sinus rhythm at 84 bpm with QT prolonged at 530  Assessment/Plan Nausea, vomiting, and diarrhea: Acute.  Patient reports having persistent symptoms over the last 3 weeks.  She denies any abdominal pain complaints.  Urinalysis was negative for signs of infection.  Unclear cause of symptoms at this time.  Question if symptoms related to diuretic vs. Infection vs. other. - Admit to a MedSurg bed - Monitor intake and output - Advance diet as tolerated - Check x-ray of abdomen - Check C. difficile and GI panel -  Check lipase, hemoglobin A1c, and TSH (TSH on care everywhere 2.58 on 07/2017) - Antiemetics on hold due to prolonged QT interval - IV fluids of normal saline at 75 mL/h overnight  - May warrant further investigation with further testing and/or imaging  Failure to thrive, weight loss: Patient notes 60 pound weight loss over the last 4 months.  Question possible progression of melanoma.   Falls and generalized weakness. - Physical therapy to eval and treat  Hypokalemia: Acute.  Mild hypokalemia noted at 3.4 on admission. - Give 20 mEq of potassium chloride  - Continue to monitor and replace as needed  Prolonged QT interval: Acute.  Initial EKG noted to QTc interval of 530. - Correct electrolyte abnormalities - recheck EKG  Renal insufficiency: Patient presents with a mild elevation in creatinine to 1.09 with BUN 12.  Patient on high-dose torsemide. - IV fluids as seen above - Held diuretic - Recheck kidney function in a.m.  History of metastatic melanoma: Patient currently being followed by Dr. Raynelle Chary at [facility].  Patient had been on infusions of nivolumab biweekly with last treatment 6/26 due to reports of rash. - Continue outpatient follow-up  Essential hypertension  - Continue clonidine and losartan as tolerated - Held torsemide    Diabetes mellitus type 2: Patient reports no history of ever being diagnosed with diabetes and currently is not on any medication for treatment.  Last available hemoglobin A1c noted to be 6.9 on 07/20/2016. - Hypoglycemic protocol - Carb modified diet - Check hemoglobin A1c - CBGs q. before meals and at bedtime with sensitive SSI  Hyperlipidemia  - Continue simvastatin  Depression/anxiety - Continue Prozac, Klonopin as needed for anxiety  DVT prophylaxis: Lovenox Code Status: full Family Communication: Discussed plan of care with the patient family present at bedside Disposition Plan: Likely discharge home in 1 to 2 days Consults called: none Admission status: Observation  Norval Morton MD [facility] Pager [phone]   If 7PM-7AM, please contact night-coverage [email or web address] Password [email or web address]  09/04/2017, 12:30 AM    \

## 2017-09-05 DIAGNOSIS — R197 Diarrhea, unspecified: Secondary | ICD-10-CM | POA: Diagnosis not present

## 2017-09-05 DIAGNOSIS — R112 Nausea with vomiting, unspecified: Secondary | ICD-10-CM | POA: Diagnosis not present

## 2017-09-05 DIAGNOSIS — E860 Dehydration: Secondary | ICD-10-CM | POA: Diagnosis not present

## 2017-09-05 DIAGNOSIS — F419 Anxiety disorder, unspecified: Secondary | ICD-10-CM | POA: Diagnosis not present

## 2017-09-05 LAB — CBC
HCT: 33.7 % — ABNORMAL LOW (ref 36.0–46.0)
HEMOGLOBIN: 11.7 g/dL — AB (ref 12.0–15.0)
MCH: 30.7 pg (ref 26.0–34.0)
MCHC: 34.7 g/dL (ref 30.0–36.0)
MCV: 88.5 fL (ref 78.0–100.0)
PLATELETS: 267 10*3/uL (ref 150–400)
RBC: 3.81 MIL/uL — AB (ref 3.87–5.11)
RDW: 13 % (ref 11.5–15.5)
WBC: 4.6 10*3/uL (ref 4.0–10.5)

## 2017-09-05 LAB — GASTROINTESTINAL PANEL BY PCR, STOOL (REPLACES STOOL CULTURE)

## 2017-09-05 LAB — BASIC METABOLIC PANEL
ANION GAP: 6 (ref 5–15)
BUN: 6 mg/dL — ABNORMAL LOW (ref 8–23)
CHLORIDE: 106 mmol/L (ref 98–111)
CO2: 24 mmol/L (ref 22–32)
Calcium: 8.4 mg/dL — ABNORMAL LOW (ref 8.9–10.3)
Creatinine, Ser: 0.79 mg/dL (ref 0.44–1.00)
GFR calc Af Amer: >60 mL/min (ref >60)
Glucose, Bld: 126 mg/dL — ABNORMAL HIGH (ref 70–99)
POTASSIUM: 3.4 mmol/L — AB (ref 3.5–5.1)
SODIUM: 136 mmol/L (ref 135–145)

## 2017-09-05 LAB — GLUCOSE, CAPILLARY
GLUCOSE-CAPILLARY: 134 mg/dL — AB (ref 70–99)
GLUCOSE-CAPILLARY: 140 mg/dL — AB (ref 70–99)
Glucose-Capillary: 113 mg/dL — ABNORMAL HIGH (ref 70–99)

## 2017-09-05 MED ORDER — ALUM & MAG HYDROXIDE-SIMETH 200-200-20 MG/5ML PO SUSP
30.0000 mL | Freq: Four times a day (QID) | ORAL | Status: DC | PRN
  Administered 2017-09-05: 30 mL via ORAL
  Filled 2017-09-05: qty 30

## 2017-09-05 MED ORDER — LOPERAMIDE HCL 2 MG PO CAPS: 2.0000 mg | ORAL_CAPSULE | Freq: Four times a day (QID) | ORAL | Status: DC | PRN

## 2017-09-05 MED ORDER — PHENOL 1.4 % MT LIQD
1.0000 | OROMUCOSAL | Status: DC | PRN
  Filled 2017-09-05: qty 177

## 2017-09-05 MED ORDER — ONDANSETRON HCL 4 MG/2ML IJ SOLN
4.0000 mg | Freq: Four times a day (QID) | INTRAMUSCULAR | Status: DC | PRN
  Administered 2017-09-05: 4 mg via INTRAVENOUS
  Filled 2017-09-05: qty 2

## 2017-09-05 NOTE — Progress Notes (Signed)
[facility]                                                                              Patient Demographics  Sarah [name], is a 67 y.o. female, DOB - 03/12/1950, SFK:[id number]  Admit date - 09/03/2017   Admitting Physician Norval Morton, MD  Outpatient Primary MD for the patient is Baxley, Cresenciano Lick, MD  Outpatient specialists:   LOS - 0  days   Medical records reviewed and are as summarized below:    Chief Complaint  Patient presents with  . Fall  . Nausea  . Weakness       Brief summary   Sarah [name] is a 67 y.o. female with medical history significant of  metastatic melanoma followed Dr. Raynelle Chary at [facility], HTN, HLD, diabetes mellitus type 2, anxiety, presented with 3 weeks history of nausea, vomiting, diarrhea with generalized weakness.  She reports being unable to tolerate any food.  She feels like food does not even get down her throat prior to her regurgitating it up.  Emesis is nonbloody and nonbilious in appearance.  She also complains of having 6-7 episodes of nonbloody diarrhea per day.  Associated symptoms include intermittent subjective fevers, chills, dry cough, intermittent palpitations, 60 pound weight loss over last 4 months, falls, anxiety, depression, lower extremity swelling, and shortness of breath with exertion.  The torsemide  that she takes for high blood pressure was recently increased because of lower extremity swelling.  Denies having any abdominal pain, dysuria. Patient had been on biweekly infusions of nivolumab up until 6/26.  However, reported developing a rash which treatments have been on hold.    Assessment & Plan    Principal Problem:   Nausea, vomiting, and diarrhea, likely viral gastroenteritis -Unclear etiology, patient reports persistent symptoms over the last 3 weeks -UA negative for UTI, C. difficile negative, GI pathogen panel negative -Abdominal x-ray negative.  CT abdomen pelvis negative for acute abdominal  pathology. -Feels a lot better today, advance to solid diet  Active Problems: Failure to thrive with weight loss -Patient has a history of melanoma, CT abdomen pelvis negative for any metastasis or acute abdominal pathology.   -PT OT evaluation recommended no PT follow-up, rolling walker  Hypokalemia -Patient on oral replacement  Prolonged QTC -Correct electrolytes, will recheck EKG  Mild acute kidney injury -Presented with creatinine of 1.09, improved to 0.8 -Holding diuretics.  History of metastatic melanoma -Currently followed by Dr. Raynelle Chary at [facility], has been on infusions of nivolumab weekly, last treatment on 6/26   Essential hypertension -Continue clonidine, losartan, hold torsemide for now  Diabetes mellitus type 2 -Hemoglobin A1c 6.1 on 8/17 -Continue carb modified diet  Hyperlipidemia -Continue simvastatin  Anxiety/depression -Continue Prozac, Klonopin as needed   Code Status: Full CODE STATUS DVT Prophylaxis:  Lovenox  Family Communication: Discussed in detail with the patient, all imaging results, lab results explained to the patient   Disposition Plan: Possible DC home in a.m. if tolerating solid diet, no further vomiting or diarrhea  Time Spent in minutes: 25 minutes  Procedures:  CT abdomen pelvis  Consultants:   None  Antimicrobials:  Medications  Scheduled Meds: . cloNIDine  0.1 mg Oral BID  . enoxaparin (LOVENOX) injection  40 mg Subcutaneous Q24H  . feeding supplement (ENSURE ENLIVE)  237 mL Oral BID BM  . FLUoxetine  40 mg Oral Daily  . insulin aspart  0-5 Units Subcutaneous QHS  . insulin aspart  0-9 Units Subcutaneous TID WC  . losartan  50 mg Oral Daily  . mouth rinse  15 mL Mouth Rinse BID  . potassium chloride SA  20 mEq Oral BID  . simvastatin  10 mg Oral QHS  . zolpidem  2.5-5 mg Oral QHS   Continuous Infusions: . sodium chloride 75 mL/hr at 09/05/17 0600   PRN Meds:.acetaminophen **OR** acetaminophen,  albuterol, clonazePAM, lip balm, loperamide, phenol   Antibiotics   Anti-infectives (From admission, onward)   None        Subjective:   Sarah [name] was seen and examined today.  Feels a lot better today, states had diarrhea yesterday 3 times watery but none today.  No nausea vomiting.  Feeling better.  No fevers or chills.  Objective:   Vitals:   09/04/17 0308 09/04/17 1401 09/04/17 2100 09/05/17 0405  BP: (!) 135/98 (!) 100/52 105/62 90/65  Pulse: 80 72 70 79  Resp: 20 20 16 18   Temp: 98.4 F (36.9 C) 98.1 F (36.7 C)  99.5 F (37.5 C)  TempSrc: Oral   Oral  SpO2: 97% 99% 100% 100%  Weight: 85 kg     Height: 5\' 5"  (1.651 m)       Intake/Output Summary (Last 24 hours) at 09/05/2017 1222 Last data filed at 09/05/2017 0600 Gross per 24 hour  Intake 2031.46 ml  Output -  Net 2031.46 ml     Wt Readings from Last 3 Encounters:  09/04/17 85 kg  08/11/16 102.1 kg  07/20/16 103.4 kg     Exam   General: Alert and oriented x 3, NAD  Eyes:   HEENT:  Atraumatic, normocephalic  Cardiovascular: S1 S2 auscultated, Regular rate and rhythm. No pedal edema b/l  Respiratory: Clear to auscultation bilaterally, no wheezing, rales or rhonchi  Gastrointestinal: Soft, nontender, nondistended, + bowel sounds  Ext: no pedal edema bilaterally  Neuro: no new deficits  Musculoskeletal: No digital cyanosis, clubbing  Skin: No rashes  Psych: Normal affect and demeanor, alert and oriented x3      Data Reviewed:  I have personally reviewed following labs and imaging studies  Micro Results Recent Results (from the past 240 hour(s))  C difficile quick scan w PCR reflex     Status: None   Collection Time: 09/04/17  1:40 PM  Result Value Ref Range Status   C Diff antigen NEGATIVE NEGATIVE Final   C Diff toxin NEGATIVE NEGATIVE Final   C Diff interpretation No C. difficile detected.  Final    Comment: Performed at [facility], [place] [address]  [place], [place] [address]  Gastrointestinal Panel by PCR , Stool     Status: None   Collection Time: 09/04/17  1:40 PM  Result Value Ref Range Status   Campylobacter species NOT DETECTED NOT DETECTED Final   Plesimonas shigelloides NOT DETECTED NOT DETECTED Final   Salmonella species NOT DETECTED NOT DETECTED Final   Yersinia enterocolitica NOT DETECTED NOT DETECTED Final   Vibrio species NOT DETECTED NOT DETECTED Final   Vibrio cholerae NOT DETECTED NOT DETECTED Final   Enteroaggregative E coli (EAEC) NOT DETECTED NOT DETECTED Final   Enteropathogenic E coli (  EPEC) NOT DETECTED NOT DETECTED Final   Enterotoxigenic E coli (ETEC) NOT DETECTED NOT DETECTED Final   Shiga like toxin producing E coli (STEC) NOT DETECTED NOT DETECTED Final   Shigella/Enteroinvasive E coli (EIEC) NOT DETECTED NOT DETECTED Final   Cryptosporidium NOT DETECTED NOT DETECTED Final   Cyclospora cayetanensis NOT DETECTED NOT DETECTED Final   Entamoeba histolytica NOT DETECTED NOT DETECTED Final   Giardia lamblia NOT DETECTED NOT DETECTED Final   Adenovirus F40/41 NOT DETECTED NOT DETECTED Final   Astrovirus NOT DETECTED NOT DETECTED Final   Norovirus GI/GII NOT DETECTED NOT DETECTED Final   Rotavirus A NOT DETECTED NOT DETECTED Final   Sapovirus (I, II, IV, and V) NOT DETECTED NOT DETECTED Final    Comment: Performed at [facility], [address], [place], [place] [address]    Radiology Reports Dg Chest 2 View  Result Date: 09/03/2017 CLINICAL DATA:  Metastatic melanoma.  Nausea and vomiting EXAM: CHEST - 2 VIEW COMPARISON:  Chest radiograph Jun 03, 2011; chest CT March 07, 2015 FINDINGS: Lungs are clear. Heart size and pulmonary vascularity are normal. No evident adenopathy. No bone lesions are appreciable. IMPRESSION: No neoplastic focus evident.  Lungs clear.  No evident adenopathy. Electronically Signed   By: Lowella Grip III M.D.   On: 09/03/2017 17:22   Ct Head Wo Contrast  Result Date:  09/03/2017 CLINICAL DATA:  67 y/o F; progressive weakness recent fall. History of stage IV melanoma. EXAM: CT HEAD WITHOUT CONTRAST TECHNIQUE: Contiguous axial images were obtained from the base of the skull through the vertex without intravenous contrast. COMPARISON:  None. FINDINGS: Brain: No evidence of acute infarction, hemorrhage, hydrocephalus, extra-axial collection or mass lesion/mass effect. Vascular: Mild calcific atherosclerosis of the carotid siphons. No hyperdense vessel identified. Skull: Normal. Negative for fracture or focal lesion. Sinuses/Orbits: No acute finding. Other: None. IMPRESSION: Stable negative CT of the head. Electronically Signed   By: Kristine Garbe M.D.   On: 09/03/2017 17:41   Ct Abdomen Pelvis W Contrast  Result Date: 09/04/2017 CLINICAL DATA:  Nausea vomiting diarrhea.  Weight loss. EXAM: CT ABDOMEN AND PELVIS WITH CONTRAST TECHNIQUE: Multidetector CT imaging of the abdomen and pelvis was performed using the standard protocol following bolus administration of intravenous contrast. CONTRAST:  184mL OMNIPAQUE IOHEXOL 300 MG/ML  SOLN COMPARISON:  Body CT 03/07/2015 FINDINGS: Lower chest: Calcific atherosclerotic disease of the coronary arteries. Hepatobiliary: Hepatic steatosis.  Postcholecystectomy. Pancreas: Unremarkable. No pancreatic ductal dilatation or surrounding inflammatory changes. Spleen: Normal in size without focal abnormality. Adrenals/Urinary Tract: 3.0 cm left adrenal mass, stable from 2017. Normal appearance of bilateral kidneys, right adrenal gland, ureters and urinary bladder. Stomach/Bowel: Stomach is within normal limits. Appendix appears normal. No evidence of bowel wall thickening, distention, or inflammatory changes. Vascular/Lymphatic: Aortic atherosclerosis. No enlarged abdominal or pelvic lymph nodes. Shotty sub pathologic by CT criteria mesenteric and retroperitoneal lymph nodes. Reproductive: Status post hysterectomy. No adnexal masses.  Other: No abdominal wall hernia or abnormality. No abdominopelvic ascites. Musculoskeletal: Stable appearance of L4-S1 PLIF. IMPRESSION: Hepatic steatosis. Stable 3 cm left adrenal mass. Small hiatal hernia. Calcific atherosclerotic disease of the coronary arteries and aorta. Electronically Signed   By: Fidela Salisbury M.D.   On: 09/04/2017 18:33   Dg Abd Acute W/chest  Result Date: 09/04/2017 CLINICAL DATA:  Patient with nausea, vomiting and diarrhea. EXAM: DG ABDOMEN ACUTE W/ 1V CHEST COMPARISON:  Chest radiograph 09/03/2017 FINDINGS: Monitoring leads overlie the patient. Stable cardiac and mediastinal contours. No consolidative pulmonary  opacities. Gas is demonstrated within nondilated loops of large and small bowel. No evidence for free intraperitoneal air. Cholecystectomy clips. Lumbar spinal fusion hardware. IMPRESSION: No acute cardiopulmonary process. Nonobstructed bowel gas pattern. Electronically Signed   By: Lovey Newcomer M.D.   On: 09/04/2017 02:30    Lab Data:  CBC: Recent Labs  Lab 09/03/17 1623 09/04/17 0308 09/05/17 0451  WBC 6.3 5.6 4.6  HGB 14.9 12.7 11.7*  HCT 41.9 37.3 33.7*  MCV 87.1 88.2 88.5  PLT 317 254 277   Basic Metabolic Panel: Recent Labs  Lab 09/03/17 1623 09/04/17 0308 09/05/17 0451  NA 137 137 136  K 3.4* 2.8* 3.4*  CL 95* 101 106  CO2 26 25 24   GLUCOSE 158* 122* 126*  BUN 12 10 6*  CREATININE 1.09* 0.89 0.79  CALCIUM 9.1 8.4* 8.4*  MG  --  1.5*  --    GFR: Estimated Creatinine Clearance: 74.5 mL/min (by C-G formula based on SCr of 0.79 mg/dL). Liver Function Tests: Recent Labs  Lab 09/03/17 1623  AST 19  ALT 19  ALKPHOS 69  BILITOT 1.1  PROT 6.7  ALBUMIN 3.7   Recent Labs  Lab 09/04/17 0308  LIPASE 27   No results for input(s): AMMONIA in the last 168 hours. Coagulation Profile: No results for input(s): INR, PROTIME in the last 168 hours. Cardiac Enzymes: Recent Labs  Lab 09/03/17 1623  TROPONINI <0.03   BNP (last 3  results) No results for input(s): PROBNP in the last 8760 hours. HbA1C: Recent Labs    09/04/17 0308  HGBA1C 6.1*   CBG: Recent Labs  Lab 09/04/17 1230 09/04/17 1629 09/04/17 2111 09/05/17 0724 09/05/17 1132  GLUCAP 93 117* 111* 113* 134*   Lipid Profile: No results for input(s): CHOL, HDL, LDLCALC, TRIG, CHOLHDL, LDLDIRECT in the last 72 hours. Thyroid Function Tests: Recent Labs    09/04/17 0308  TSH 1.673   Anemia Panel: No results for input(s): VITAMINB12, FOLATE, FERRITIN, TIBC, IRON, RETICCTPCT in the last 72 hours. Urine analysis:    Component Value Date/Time   COLORURINE YELLOW 09/03/2017 2314   APPEARANCEUR CLEAR 09/03/2017 2314   APPEARANCEUR Cloudy 06/03/2011 0945   LABSPEC 1.016 09/03/2017 2314   LABSPEC 1.025 06/03/2011 0945   PHURINE 5.0 09/03/2017 2314   GLUCOSEU NEGATIVE 09/03/2017 2314   GLUCOSEU Negative 06/03/2011 0945   HGBUR NEGATIVE 09/03/2017 [place] 09/03/2017 2314   BILIRUBINUR negative 07/20/2016 1019   BILIRUBINUR Negative 06/03/2011 0945   KETONESUR 5 (A) 09/03/2017 2314   PROTEINUR NEGATIVE 09/03/2017 2314   UROBILINOGEN 0.2 07/20/2016 1019   UROBILINOGEN 0.2 08/23/2010 1709   NITRITE NEGATIVE 09/03/2017 2314   LEUKOCYTESUR NEGATIVE 09/03/2017 2314   LEUKOCYTESUR Negative 06/03/2011 0945     Ripudeep Rai M.D. [facility] 09/05/2017, 12:22 PM  Pager: [phone] Between 7am to 7pm - call Pager - [phone]  After 7pm go to [email or web address] - password [email or web address]  Call night coverage person covering after 7pm

## 2017-09-06 DIAGNOSIS — R197 Diarrhea, unspecified: Secondary | ICD-10-CM | POA: Diagnosis not present

## 2017-09-06 DIAGNOSIS — F419 Anxiety disorder, unspecified: Secondary | ICD-10-CM | POA: Diagnosis not present

## 2017-09-06 DIAGNOSIS — R112 Nausea with vomiting, unspecified: Secondary | ICD-10-CM | POA: Diagnosis not present

## 2017-09-06 DIAGNOSIS — E860 Dehydration: Secondary | ICD-10-CM | POA: Diagnosis not present

## 2017-09-06 LAB — BASIC METABOLIC PANEL
ANION GAP: 5 (ref 5–15)
CHLORIDE: 111 mmol/L (ref 98–111)
CO2: 22 mmol/L (ref 22–32)
Calcium: 8.3 mg/dL — ABNORMAL LOW (ref 8.9–10.3)
Creatinine, Ser: 0.74 mg/dL (ref 0.44–1.00)
Glucose, Bld: 106 mg/dL — ABNORMAL HIGH (ref 70–99)
POTASSIUM: 3.8 mmol/L (ref 3.5–5.1)
SODIUM: 138 mmol/L (ref 135–145)

## 2017-09-06 LAB — CBC
HCT: 33.7 % — ABNORMAL LOW (ref 36.0–46.0)
Hemoglobin: 11.5 g/dL — ABNORMAL LOW (ref 12.0–15.0)
MCH: 30.4 pg (ref 26.0–34.0)
MCHC: 34.1 g/dL (ref 30.0–36.0)
MCV: 89.2 fL (ref 78.0–100.0)
Platelets: 233 10*3/uL (ref 150–400)
RBC: 3.78 MIL/uL — AB (ref 3.87–5.11)
RDW: 13 % (ref 11.5–15.5)
WBC: 3.9 10*3/uL — AB (ref 4.0–10.5)

## 2017-09-06 LAB — GLUCOSE, CAPILLARY: GLUCOSE-CAPILLARY: 102 mg/dL — AB (ref 70–99)

## 2017-09-06 MED ORDER — LOPERAMIDE HCL 2 MG PO CAPS: 2.0000 mg | ORAL_CAPSULE | Freq: Four times a day (QID) | ORAL | 0 refills | Status: DC | PRN

## 2017-09-06 MED ORDER — PROCHLORPERAZINE MALEATE 10 MG PO TABS: 10.0000 mg | ORAL_TABLET | Freq: Three times a day (TID) | ORAL | 0 refills | Status: DC | PRN

## 2017-09-06 NOTE — Progress Notes (Signed)
Physical Therapy Treatment Patient Details Name: Sarah [name] MRN: [id number] DOB: [birth year] Today's Date: 09/06/2017    History of Present Illness Patient is a 67 y/o female presenting to the ED on 09/03/17 with nausea, vomiting, and diarrhea. PMH  significant ofmetastatic melanoma followed Dr. Margarita Mail, HTN, HLD, diabetes mellitus type 2, anxiety, depression, history of suicide attempt. Continued work-up.    PT Comments    Assisted OOB to bathroom then in hallway.  Pt required use of walker for mild instability and overall weakness.  Pt plans to live on main level of home until she feels stronger.    Follow Up Recommendations  No PT follow up     Equipment Recommendations  Rolling walker with 5" wheels    Recommendations for Other Services       Precautions / Restrictions Precautions Precautions: Fall Restrictions Weight Bearing Restrictions: No    Mobility  Bed Mobility Overal bed mobility: Modified Independent             General bed mobility comments: increased time  Transfers Overall transfer level: Needs assistance Equipment used: Rolling walker (2 wheeled) Transfers: Sit to/from Stand Sit to Stand: Supervision         General transfer comment: increased time  Ambulation/Gait Ambulation/Gait assistance: Supervision;Min guard Gait Distance (Feet): 120 Feet Assistive device: Rolling walker (2 wheeled) Gait Pattern/deviations: Step-through pattern;Decreased stride length     General Gait Details: good alternating gait and safety cognition   Stairs             Wheelchair Mobility    Modified Rankin (Stroke Patients Only)       Balance                                            Cognition Arousal/Alertness: Awake/alert Behavior During Therapy: WFL for tasks assessed/performed Overall Cognitive Status: Within Functional Limits for tasks assessed                                         Exercises      General Comments        Pertinent Vitals/Pain Pain Assessment: No/denies pain    Home Living                      Prior Function            PT Goals (current goals can now be found in the care plan section) Progress towards PT goals: Progressing toward goals    Frequency    Min 3X/week      PT Plan Current plan remains appropriate    Co-evaluation              AM-PAC PT "6 Clicks" Daily Activity  Outcome Measure  Difficulty turning over in bed (including adjusting bedclothes, sheets and blankets)?: A Little Difficulty moving from lying on back to sitting on the side of the bed? : A Little Difficulty sitting down on and standing up from a chair with arms (e.g., wheelchair, bedside commode, etc,.)?: A Little Help needed moving to and from a bed to chair (including a wheelchair)?: A Little Help needed walking in hospital room?: A Little Help needed climbing 3-5 steps with a railing? : A Little 6 Click Score: 18  End of Session Equipment Utilized During Treatment: Gait belt Activity Tolerance: Patient tolerated treatment well Patient left: in bed;with call bell/phone within reach Nurse Communication: Mobility status PT Visit Diagnosis: Unsteadiness on feet (R26.81);Other abnormalities of gait and mobility (R26.89);Muscle weakness (generalized) (M62.81)     Time: 1025-1050 PT Time Calculation (min) (ACUTE ONLY): 25 min  Charges:  $Gait Training: 8-22 mins $Therapeutic Activity: 8-22 mins                     Rica Koyanagi  PTA [facility]  Acute  Rehab Pager      [phone]

## 2017-09-06 NOTE — Discharge Summary (Signed)
Physician Discharge Summary   Patient ID: Sarah [name] MRN: [id number] DOB/AGE: April 22, 1950 67 y.o.  Admit date: 09/03/2017 Discharge date: 09/06/2017  Primary Care Physician:  Sarah Showers, MD   Recommendations for Outpatient Follow-up:  1. Follow up with PCP in 1-2 weeks  Home Health: None Equipment/Devices: None  Discharge Condition: stable  CODE STATUS: FULL  Diet recommendation: Patient recommended soft bland diet for couple days and then advance as tolerated   Discharge Diagnoses:   Acute gastroenteritis . Failure to thrive in adult . Prolonged Q-T interval on ECG . Nausea, vomiting, and diarrhea resolved . Hypokalemia . Hypertension . Depression . Anxiety   Consults: None    Allergies:   Allergies  Allergen Reactions  . Doxycycline Rash  . Ciprofloxacin Rash    "very bad rash"  . Penicillins Rash    Has patient had a PCN reaction causing immediate rash, facial/tongue/throat swelling, SOB or lightheadedness with hypotension: No Has patient had a PCN reaction causing severe rash involving mucus membranes or skin necrosis: No Has patient had a PCN reaction that required hospitalization: No Has patient had a PCN reaction occurring within the last 10 years: No If all of the above answers are "NO", then may proceed with Cephalosporin use.      DISCHARGE MEDICATIONS: Allergies as of 09/06/2017      Reactions   Doxycycline Rash   Ciprofloxacin Rash   "very bad rash"   Penicillins Rash   Has patient had a PCN reaction causing immediate rash, facial/tongue/throat swelling, SOB or lightheadedness with hypotension: No Has patient had a PCN reaction causing severe rash involving mucus membranes or skin necrosis: No Has patient had a PCN reaction that required hospitalization: No Has patient had a PCN reaction occurring within the last 10 years: No If all of the above answers are "NO", then may proceed with Cephalosporin use.      Medication List    TAKE  these medications   clonazePAM 1 MG tablet Commonly known as:  KLONOPIN Take 1 mg by mouth 3 (three) times daily as needed for anxiety.   cloNIDine 0.1 MG tablet Commonly known as:  CATAPRES Take 1 tablet (0.1 mg total) by mouth 2 (two) times daily.   ergocalciferol 50000 units capsule Commonly known as:  VITAMIN D2 Take 1 weekly for 12 weeks   FLUoxetine 40 MG capsule Commonly known as:  PROZAC Take 40 mg by mouth daily.   gabapentin 300 MG capsule Commonly known as:  NEURONTIN Take 300 mg by mouth 3 (three) times daily.   loperamide 2 MG capsule Commonly known as:  IMODIUM Take 1 capsule (2 mg total) by mouth every 6 (six) hours as needed for diarrhea or loose stools.   losartan 50 MG tablet Commonly known as:  COZAAR TAKE 1 TABLET BY MOUTH DAILY   potassium chloride SA 20 MEQ tablet Commonly known as:  K-DUR,KLOR-CON Take 20 mEq by mouth 2 (two) times daily.   prochlorperazine 10 MG tablet Commonly known as:  COMPAZINE Take 1 tablet (10 mg total) by mouth every 8 (eight) hours as needed for nausea or vomiting.   simvastatin 10 MG tablet Commonly known as:  ZOCOR TAKE ONE TABLET BY MOUTH EVERY NIGHT AT BEDTIME   torsemide 20 MG tablet Commonly known as:  DEMADEX TAKE 1 TABLET BY MOUTH TWICE A DAY   triamcinolone cream 0.1 % Commonly known as:  KENALOG Apply 1 application topically 2 (two) times daily.   vitamin B-12 1000 MCG tablet  Commonly known as:  CYANOCOBALAMIN Take 1,000 mcg by mouth daily.   zolpidem 10 MG tablet Commonly known as:  AMBIEN Take 2.5-5 mg by mouth at bedtime. Alternate 2.5mg  and 5mg  QOD        Brief H and P: For complete details please refer to admission H and P, but in brief Sarah [name] a 67 y.o.femalewith medical history significant ofmetastatic melanoma followed Dr. Margarita [name], HTN, HLD, diabetes mellitus type 2, anxiety, presented with 3 weeks history of nausea, vomiting, diarrhea with generalized weakness. She  reports being unable to tolerate any food. She feels like food does not even get down her throat prior to her regurgitating it up. Emesis is nonbloody and nonbilious in appearance. She also complains of having 6-7 episodes of nonbloody diarrhea per day. Associated symptoms include intermittent subjective fevers, chills, dry cough, intermittent palpitations, 60 pound weight loss over last 4 months, falls, anxiety, depression, lower extremity swelling, and shortness of breath with exertion. The torsemidethat she takes for high blood pressure was recently increased because of lower extremity swelling. Denies having any abdominal pain, dysuria. Patient had been on biweekly infusions ofnivolumabup until 6/26. However, reported developing a rash which treatments have been on hold.    Hospital Course:  Nausea, vomiting, and diarrhea, likely viral gastroenteritis -Unclear etiology, patient reports persistent symptoms over the last 3 weeks -UA negative for UTI, C. difficile negative, GI pathogen panel negative -Abdominal x-ray negative.  CT abdomen pelvis negative for acute abdominal pathology. -Patient has been tolerating solid diet without any difficulty, no further nausea vomiting or diarrhea.   Failure to thrive with weight loss -Patient has a history of melanoma, CT abdomen pelvis negative for any metastasis or acute abdominal pathology.   -PT OT evaluation recommended no PT follow-up, rolling walker  Hypokalemia -Resolved, 3.8 at the time of discharge.  Prolonged QTC -Follow EKG intermittently, avoid QTC prolonging medications   Mild acute kidney injury -Presented with creatinine of 1.09 Diuretics were held.  Creatinine 0.74, patient may restart torsemide tomorrow  History of metastatic melanoma -Currently followed by Dr. Raynelle [name] at [facility], has been on infusions of nivolumab weekly, last treatment on 6/26   Essential hypertension -Continue clonidine, losartan  Diabetes  mellitus type 2 -Hemoglobin A1c 6.1 on 8/17 -Continue carb modified diet  Hyperlipidemia -Continue simvastatin  Anxiety/depression -Continue Prozac, Klonopin as needed  Day of Discharge S: Feels a lot better today, no further nausea vomiting or diarrhea, hoping to go home  BP 117/60 (BP Location: Left Arm)   Pulse 72   Temp 98.3 F (36.8 C) (Oral)   Resp 16   Ht 5\' 5"  (1.651 m)   Wt 85 kg   SpO2 100%   BMI 31.18 kg/m   Physical Exam: General: Alert and awake oriented x3 not in any acute distress. HEENT: anicteric sclera, pupils reactive to light and accommodation CVS: S1-S2 clear no murmur rubs or gallops Chest: clear to auscultation bilaterally, no wheezing rales or rhonchi Abdomen: soft nontender, nondistended, normal bowel sounds Extremities: no cyanosis, clubbing or edema noted bilaterally Neuro: Cranial nerves II-XII intact, no focal neurological deficits   The results of significant diagnostics from this hospitalization (including imaging, microbiology, ancillary and laboratory) are listed below for reference.      Procedures/Studies:  Dg Chest 2 View  Result Date: 09/03/2017 CLINICAL DATA:  Metastatic melanoma.  Nausea and vomiting EXAM: CHEST - 2 VIEW COMPARISON:  Chest radiograph Jun 03, 2011; chest CT March 07, 2015 FINDINGS: Lungs are clear.  Heart size and pulmonary vascularity are normal. No evident adenopathy. No bone lesions are appreciable. IMPRESSION: No neoplastic focus evident.  Lungs clear.  No evident adenopathy. Electronically Signed   By: Lowella Grip III M.D.   On: 09/03/2017 17:22   Ct Head Wo Contrast  Result Date: 09/03/2017 CLINICAL DATA:  67 y/o F; progressive weakness recent fall. History of stage IV melanoma. EXAM: CT HEAD WITHOUT CONTRAST TECHNIQUE: Contiguous axial images were obtained from the base of the skull through the vertex without intravenous contrast. COMPARISON:  None. FINDINGS: Brain: No evidence of acute infarction,  hemorrhage, hydrocephalus, extra-axial collection or mass lesion/mass effect. Vascular: Mild calcific atherosclerosis of the carotid siphons. No hyperdense vessel identified. Skull: Normal. Negative for fracture or focal lesion. Sinuses/Orbits: No acute finding. Other: None. IMPRESSION: Stable negative CT of the head. Electronically Signed   By: Kristine Garbe M.D.   On: 09/03/2017 17:41   Ct Abdomen Pelvis W Contrast  Result Date: 09/04/2017 CLINICAL DATA:  Nausea vomiting diarrhea.  Weight loss. EXAM: CT ABDOMEN AND PELVIS WITH CONTRAST TECHNIQUE: Multidetector CT imaging of the abdomen and pelvis was performed using the standard protocol following bolus administration of intravenous contrast. CONTRAST:  131mL OMNIPAQUE IOHEXOL 300 MG/ML  SOLN COMPARISON:  Body CT 03/07/2015 FINDINGS: Lower chest: Calcific atherosclerotic disease of the coronary arteries. Hepatobiliary: Hepatic steatosis.  Postcholecystectomy. Pancreas: Unremarkable. No pancreatic ductal dilatation or surrounding inflammatory changes. Spleen: Normal in size without focal abnormality. Adrenals/Urinary Tract: 3.0 cm left adrenal mass, stable from 2017. Normal appearance of bilateral kidneys, right adrenal gland, ureters and urinary bladder. Stomach/Bowel: Stomach is within normal limits. Appendix appears normal. No evidence of bowel wall thickening, distention, or inflammatory changes. Vascular/Lymphatic: Aortic atherosclerosis. No enlarged abdominal or pelvic lymph nodes. Shotty sub pathologic by CT criteria mesenteric and retroperitoneal lymph nodes. Reproductive: Status post hysterectomy. No adnexal masses. Other: No abdominal wall hernia or abnormality. No abdominopelvic ascites. Musculoskeletal: Stable appearance of L4-S1 PLIF. IMPRESSION: Hepatic steatosis. Stable 3 cm left adrenal mass. Small hiatal hernia. Calcific atherosclerotic disease of the coronary arteries and aorta. Electronically Signed   By: Fidela Salisbury M.D.    On: 09/04/2017 18:33   Dg Abd Acute W/chest  Result Date: 09/04/2017 CLINICAL DATA:  Patient with nausea, vomiting and diarrhea. EXAM: DG ABDOMEN ACUTE W/ 1V CHEST COMPARISON:  Chest radiograph 09/03/2017 FINDINGS: Monitoring leads overlie the patient. Stable cardiac and mediastinal contours. No consolidative pulmonary opacities. Gas is demonstrated within nondilated loops of large and small bowel. No evidence for free intraperitoneal air. Cholecystectomy clips. Lumbar spinal fusion hardware. IMPRESSION: No acute cardiopulmonary process. Nonobstructed bowel gas pattern. Electronically Signed   By: Lovey Newcomer M.D.   On: 09/04/2017 02:30       LAB RESULTS: Basic Metabolic Panel: Recent Labs  Lab 09/04/17 0308 09/05/17 0451 09/06/17 0533  NA 137 136 138  K 2.8* 3.4* 3.8  CL 101 106 111  CO2 25 24 22   GLUCOSE 122* 126* 106*  BUN 10 6* <5*  CREATININE 0.89 0.79 0.74  CALCIUM 8.4* 8.4* 8.3*  MG 1.5*  --   --    Liver Function Tests: Recent Labs  Lab 09/03/17 1623  AST 19  ALT 19  ALKPHOS 69  BILITOT 1.1  PROT 6.7  ALBUMIN 3.7   Recent Labs  Lab 09/04/17 0308  LIPASE 27   No results for input(s): AMMONIA in the last 168 hours. CBC: Recent Labs  Lab 09/05/17 0451 09/06/17 0533  WBC 4.6 3.9*  HGB  11.7* 11.5*  HCT 33.7* 33.7*  MCV 88.5 89.2  PLT 267 233   Cardiac Enzymes: Recent Labs  Lab 09/03/17 1623  TROPONINI <0.03   BNP: Invalid input(s): POCBNP CBG: Recent Labs  Lab 09/05/17 1759 09/06/17 1206  GLUCAP 140* 102*      Disposition and Follow-up: Discharge Instructions    Diet - low sodium heart healthy   Complete by:  As directed    Discharge instructions   Complete by:  As directed    DIET: soft bland diet for a couple of days, then advance as tolerated   Increase activity slowly   Complete by:  As directed        DISPOSITION: Home   DISCHARGE FOLLOW-UP Follow-up Information    Sarah Showers, MD. Schedule an appointment as soon as  possible for a visit in 2 week(s).   Specialty:  Internal Medicine Contact information: [address] [address] [phone]            Time coordinating discharge:  35 minutes  Signed:   Estill Cotta M.D. [facility] 09/06/2017, 12:44 PM Pager: [phone]

## 2017-09-07 ENCOUNTER — Telehealth: Payer: Self-pay | Admitting: Internal Medicine

## 2017-09-08 LAB — GLUCOSE, CAPILLARY
Glucose-Capillary: 93 mg/dL (ref 70–99)
Glucose-Capillary: 108 mg/dL — ABNORMAL HIGH (ref 70–99)

## 2017-09-20 ENCOUNTER — Other Ambulatory Visit: Payer: Self-pay | Admitting: Internal Medicine

## 2017-09-21 ENCOUNTER — Telehealth: Payer: Self-pay

## 2017-09-21 NOTE — Telephone Encounter (Signed)
Her insurance company is expecting a follow up here as is medicare. Please reshedule.

## 2017-09-21 NOTE — Telephone Encounter (Signed)
Patient called to cancel today's appointment she was scheduled for a hospital follow up at 12:00pm, she said she goes to [facility] 3-4 times a week to see different specialists and she doesn't seem the need to follow up here. She said she has an appt with dermatology for her skin issues. She said she will have [facility] send you office notes.

## 2017-09-27 ENCOUNTER — Other Ambulatory Visit: Payer: Self-pay | Admitting: Internal Medicine

## 2017-09-27 NOTE — Telephone Encounter (Signed)
LM for patient on her voicemail.  Stressed the importance of following up with her PCP.  Also advised this is a guideline that Medicare/Insurance Companies set in place and not something that the physicians ask that she does.  So, due to insurance guidelines, we are asking that she make her f/u appointment.    Waiting for her to call back and make her F/U hospital discharge appointment.

## 2017-10-04 ENCOUNTER — Emergency Department (HOSPITAL_COMMUNITY): Payer: Medicare Other

## 2017-10-04 ENCOUNTER — Other Ambulatory Visit: Payer: Self-pay

## 2017-10-04 ENCOUNTER — Encounter (HOSPITAL_COMMUNITY): Payer: Self-pay | Admitting: Emergency Medicine

## 2017-10-04 ENCOUNTER — Emergency Department (HOSPITAL_COMMUNITY)
Admission: EM | Admit: 2017-10-04 | Discharge: 2017-10-04 | Disposition: A | Discharge status: Home / Self Care | Payer: Medicare Other | Attending: Emergency Medicine | Admitting: Emergency Medicine

## 2017-10-04 DIAGNOSIS — Y939 Activity, unspecified: Secondary | ICD-10-CM | POA: Insufficient documentation

## 2017-10-04 DIAGNOSIS — Z79899 Other long term (current) drug therapy: Secondary | ICD-10-CM | POA: Insufficient documentation

## 2017-10-04 DIAGNOSIS — E119 Type 2 diabetes mellitus without complications: Secondary | ICD-10-CM | POA: Diagnosis not present

## 2017-10-04 DIAGNOSIS — R51 Headache: Secondary | ICD-10-CM | POA: Diagnosis present

## 2017-10-04 DIAGNOSIS — R42 Dizziness and giddiness: Secondary | ICD-10-CM | POA: Diagnosis not present

## 2017-10-04 DIAGNOSIS — I10 Essential (primary) hypertension: Secondary | ICD-10-CM | POA: Insufficient documentation

## 2017-10-04 DIAGNOSIS — Y929 Unspecified place or not applicable: Secondary | ICD-10-CM | POA: Insufficient documentation

## 2017-10-04 DIAGNOSIS — W19XXXA Unspecified fall, initial encounter: Secondary | ICD-10-CM

## 2017-10-04 DIAGNOSIS — W010XXA Fall on same level from slipping, tripping and stumbling without subsequent striking against object, initial encounter: Secondary | ICD-10-CM | POA: Insufficient documentation

## 2017-10-04 DIAGNOSIS — Y999 Unspecified external cause status: Secondary | ICD-10-CM | POA: Insufficient documentation

## 2017-10-04 DIAGNOSIS — M6281 Muscle weakness (generalized): Secondary | ICD-10-CM | POA: Diagnosis not present

## 2017-10-04 DIAGNOSIS — Z85820 Personal history of malignant melanoma of skin: Secondary | ICD-10-CM | POA: Insufficient documentation

## 2017-10-04 DIAGNOSIS — R519 Headache, unspecified: Secondary | ICD-10-CM

## 2017-10-04 HISTORY — DX: Malignant (primary) neoplasm, unspecified: C80.1

## 2017-10-04 LAB — URINALYSIS, ROUTINE W REFLEX MICROSCOPIC
BACTERIA UA: NONE SEEN
Bilirubin Urine: NEGATIVE
Glucose, UA: NEGATIVE mg/dL
Hgb urine dipstick: NEGATIVE
Ketones, ur: NEGATIVE mg/dL
Nitrite: NEGATIVE
PH: 5 (ref 5.0–8.0)
Protein, ur: NEGATIVE mg/dL
Specific Gravity, Urine: 1.009 (ref 1.005–1.030)

## 2017-10-04 LAB — BASIC METABOLIC PANEL
Anion gap: 12 (ref 5–15)
BUN: 24 mg/dL — AB (ref 8–23)
CHLORIDE: 103 mmol/L (ref 98–111)
CO2: 26 mmol/L (ref 22–32)
Calcium: 9.5 mg/dL (ref 8.9–10.3)
Creatinine, Ser: 0.91 mg/dL (ref 0.44–1.00)
GFR calc Af Amer: >60 mL/min (ref >60)
GFR calc non Af Amer: >60 mL/min (ref >60)
Glucose, Bld: 166 mg/dL — ABNORMAL HIGH (ref 70–99)
Potassium: 4 mmol/L (ref 3.5–5.1)
Sodium: 141 mmol/L (ref 135–145)

## 2017-10-04 LAB — CBC
HCT: 42.8 % (ref 36.0–46.0)
Hemoglobin: 14.5 g/dL (ref 12.0–15.0)
MCH: 30.4 pg (ref 26.0–34.0)
MCHC: 33.9 g/dL (ref 30.0–36.0)
MCV: 89.7 fL (ref 78.0–100.0)
PLATELETS: 246 10*3/uL (ref 150–400)
RBC: 4.77 MIL/uL (ref 3.87–5.11)
RDW: 13 % (ref 11.5–15.5)
WBC: 12.2 10*3/uL — ABNORMAL HIGH (ref 4.0–10.5)

## 2017-10-04 LAB — BRAIN NATRIURETIC PEPTIDE: B Natriuretic Peptide: 22 pg/mL (ref 0.0–100.0)

## 2017-10-04 MED ORDER — FLUTICASONE PROPIONATE 50 MCG/ACT NA SUSP: 1.0000 | Freq: Every day | NASAL | 0 refills | Status: DC

## 2017-10-04 MED ORDER — MECLIZINE HCL 25 MG PO TABS: 25.0000 mg | ORAL_TABLET | Freq: Three times a day (TID) | ORAL | 0 refills | Status: DC | PRN

## 2017-10-04 NOTE — ED Notes (Signed)
Patient transported to MRI 

## 2017-10-04 NOTE — ED Triage Notes (Signed)
Pt reports that she fell this morning after getting dizzy when getting out of the bed going to the restroom.  Pt unsure if lost consciousness or not. Pt reports that she had headache since yesterday. Denies taking blood thinners.

## 2017-10-04 NOTE — ED Provider Notes (Signed)
   Face-to-face evaluation   History: She is here for evaluation of worsening dizziness.  She contacted her doctor at [facility] who was concerned so recommended that she come to the ED.  Physical exam: Alert, ambulating with a walker, steady gait.   Medical screening examination/treatment/procedure(s) were conducted as a shared visit with non-physician practitioner(s) and myself.  I personally evaluated the patient during the encounter     Daleen Bo, MD 10/07/17 2216

## 2017-10-04 NOTE — Discharge Instructions (Signed)
Continue taking home medication as prescribed. Make sure you are staying well-hydrated with water. Use Flonase daily for nasal congestion and dizziness. Use meclizine as needed for persistent dizziness.  Have caution, as this may make you more dehydrated. Follow-up with your primary care doctor for further evaluation of your dizziness and weakness. Return if you develop slurred speech, vision changes, numbness/weakness of the arm or leg, or with any new or concerning symptoms

## 2017-10-04 NOTE — ED Provider Notes (Signed)
[name]   CSN: [id number] Arrival date & time: 10/04/17  1447     History   Chief Complaint Chief Complaint  Patient presents with  . Fall  . Headache  . Loss of Consciousness    HPI Sarah [name] is a 67 y.o. female presenting for evaluation of dizziness, fall, and headache.  Patient states she felt dizzy when she went from sitting to standing today when waking up.  This normally resolves when she stands for extended period of time, but she had continued dizziness today.  This caused her to fall, she hit her head and she is unsure if she lost consciousness.  She reports a mild headache which is been present since yesterday.  This is not improved with Tylenol.  Patient states she called her doctor at [facility], who recommended she comes to the emergency room for further evaluation.  Patient reports over the past several weeks, she has been having increasing tiredness/weakness.  She has not been eating very well.  She has stage III melanoma, currently not receiving any treatment.  States she has been having increasing difficulty walking over the past month, possibly due to weakness but also due to dizziness.  She is not on blood thinners. Patient denies recent fevers, chills, chest pain, shortness of breath, nausea, vomiting, abdominal pain, urinary symptoms, abnormal bowel movements.  Reports mild dizziness currently, worse when going from sitting to standing.  Not worse when moving her head from one side to the other  Additional history obtained from chart review, patient with a history of diabetes, hyperlipidemia, hypertension, depression, failure to thrive, melanoma.  Pt recently diagnosed with adrenal insufficiency, on steroids. Has been taking it as prescribed.   HPI  Past Medical History:  Diagnosis Date  . Anxiety   . Cancer ([place])    melinoma  . Constipation   . Dependent edema   . Depression   . Diabetes mellitus   .  Dysmenorrhea   . H/O: suicide attempt   . Hyperlipidemia   . Hypertension   . Insomnia   . PMS (premenstrual syndrome)     Patient Active Problem List   Diagnosis Date Noted  . Failure to thrive in adult 09/04/2017  . Prolonged Q-T interval on ECG 09/04/2017  . Nausea, vomiting, and diarrhea 09/04/2017  . Anxiety 09/04/2017  . Bipolar disorder ([place]) 12/26/2011  . Depression 09/17/2010  . Hypokalemia 09/17/2010  . Hypertension 09/17/2010  . Hyperlipidemia 09/17/2010  . Dependent edema 09/17/2010  . Diabetes mellitus ([place]) 09/17/2010    Past Surgical History:  Procedure Laterality Date  . ABDOMINAL HYSTERECTOMY    . BREAST BIOPSY Right    benign  . CHOLECYSTECTOMY    . DILATION AND CURETTAGE OF UTERUS    . LUMBAR FUSION    . TUBAL LIGATION       OB History   None      Home Medications    Prior to Admission medications   Medication Sig Start Date End Date Taking? Authorizing Provider  acetaminophen (TYLENOL) 500 MG tablet Take 1,000 mg by mouth daily as needed for moderate pain.   Yes [provider]  clonazePAM (KLONOPIN) 1 MG disintegrating tablet Take 1 mg by mouth 3 (three) times daily. 09/27/17  Yes [provider]  cloNIDine (CATAPRES) 0.1 MG tablet Take 1 tablet (0.1 mg total) by mouth 2 (two) times daily. 05/27/15  Yes Baxley, Cresenciano Lick, MD  ergocalciferol (VITAMIN D2) 50000 units capsule Take  1 weekly for 12 weeks 05/24/15  Yes Baxley, Cresenciano Lick, MD  FLUoxetine (PROZAC) 40 MG capsule Take 40 mg by mouth daily. 07/02/17  Yes [provider]  gabapentin (NEURONTIN) 300 MG capsule Take 300 mg by mouth 3 (three) times daily. 07/23/17  Yes [provider]  hydrocortisone (CORTEF) 10 MG tablet Take 20 mg by mouth 3 (three) times daily. 09/27/17  Yes [provider]  losartan (COZAAR) 50 MG tablet TAKE 1 TABLET BY MOUTH DAILY 06/21/17  Yes Baxley, Cresenciano Lick, MD  potassium chloride SA (K-DUR,KLOR-CON) 20 MEQ tablet Take 20 mEq by mouth 2 (two)  times daily.     Yes [provider]  simvastatin (ZOCOR) 10 MG tablet TAKE ONE TABLET BY MOUTH EVERY NIGHT AT BEDTIME 09/20/17  Yes Elby Showers, MD  torsemide (DEMADEX) 20 MG tablet TAKE 1 TABLET BY MOUTH TWICE A DAY 04/15/17  Yes Baxley, Cresenciano Lick, MD  triamcinolone ointment (KENALOG) 0.5 % Apply 1 application topically 2 (two) times daily. 09/19/17  Yes [provider]  zolpidem (AMBIEN) 10 MG tablet Take 2.5-5 mg by mouth at bedtime. Alternate 2.5mg  and 5mg  QOD   Yes [provider]  fluticasone (FLONASE) 50 MCG/ACT nasal spray Place 1 spray into both nostrils daily. 10/04/17   Diara Chaudhari, PA-C  loperamide (IMODIUM) 2 MG capsule Take 1 capsule (2 mg total) by mouth every 6 (six) hours as needed for diarrhea or loose stools. Patient not taking: Reported on 10/04/2017 09/06/17   Rai, Vernelle Emerald, MD  meclizine (ANTIVERT) 25 MG tablet Take 1 tablet (25 mg total) by mouth 3 (three) times daily as needed for dizziness. 10/04/17   Celene Pippins, PA-C  prochlorperazine (COMPAZINE) 10 MG tablet Take 1 tablet (10 mg total) by mouth every 8 (eight) hours as needed for nausea or vomiting. Patient not taking: Reported on 10/04/2017 09/06/17   Mendel Corning, MD    Family History Family History  Problem Relation Age of Onset  . Diabetes Mother   . Mental illness Mother     Social History Social History   Tobacco Use  . Smoking status: Never Smoker  . Smokeless tobacco: Never Used  Substance Use Topics  . Alcohol use: Not Currently  . Drug use: Not on file     Allergies   Doxycycline; Ciprofloxacin; and Penicillins   Review of Systems Review of Systems  Constitutional: Positive for appetite change (Decreased appetite).  Neurological: Positive for dizziness, weakness (Over the past several months) and headaches.  All other systems reviewed and are negative.    Physical Exam Updated Vital Signs BP 112/71   Pulse 61   Temp 98.1 F (36.7 C) (Oral)   Resp  13   Ht 5\' 5"  (1.651 m)   Wt 80.7 kg   SpO2 95%   BMI 29.62 kg/m   Physical Exam  Constitutional: She is oriented to person, place, and time. She appears well-developed and well-nourished. No distress.  Elderly female in no acute distress  HENT:  Head: Normocephalic and atraumatic.  No obvious head injury.  OP clear without tonsillar swelling or exudate.  Uvula midline with equal palate rise.  TMs nonerythematous and not bulging bilaterally.  Eyes: Pupils are equal, round, and reactive to light. Conjunctivae and EOM are normal.  EOMI and PERRLA.  No nystagmus.  Neck: Normal range of motion. Neck supple.  Cardiovascular: Normal rate, regular rhythm and intact distal pulses.  Pulmonary/Chest: Effort normal and breath sounds normal. No respiratory distress.  She has no wheezes.  Abdominal: Soft. She exhibits no distension and no mass. There is no tenderness. There is no rebound and no guarding.  Musculoskeletal: Normal range of motion.  Strength intact x4.  Sensation intact x4.  Radial and pedal pulses intact bilaterally.  Soft compartments.  Neurological: She is alert and oriented to person, place, and time. No cranial nerve deficit or sensory deficit. Coordination normal.  No obvious neurologic deficits.  Nose to finger intact.  Grip strength intact.  CN intact.  Fine movement and coordination intact.  Skin: Skin is warm and dry. Capillary refill takes less than 2 seconds.  Psychiatric: She has a normal mood and affect.  Nursing note and vitals reviewed.    ED Treatments / Results  Labs (all labs ordered are listed, but only abnormal results are displayed) Labs Reviewed  BASIC METABOLIC PANEL - Abnormal; Notable for the following components:      Result Value   Glucose, Bld 166 (*)    BUN 24 (*)    All other components within normal limits  CBC - Abnormal; Notable for the following components:   WBC 12.2 (*)    All other components within normal limits  URINALYSIS, ROUTINE W  REFLEX MICROSCOPIC - Abnormal; Notable for the following components:   Color, Urine STRAW (*)    Leukocytes, UA LARGE (*)    All other components within normal limits  URINE CULTURE  BRAIN NATRIURETIC PEPTIDE    EKG None  Radiology Dg Chest 2 View  Result Date: 10/04/2017 CLINICAL DATA:  Patient fell this morning after being dizzy.  Cough. EXAM: CHEST - 2 VIEW COMPARISON:  CXR 09/04/2017 FINDINGS: The heart size and mediastinal contours are within normal limits. Both lungs are clear. The visualized skeletal structures are unremarkable. IMPRESSION: No active cardiopulmonary disease. Electronically Signed   By: Ashley Royalty M.D.   On: 10/04/2017 19:01   Mr Brain Wo Contrast  Result Date: 10/04/2017 CLINICAL DATA:  67 y/o F; fall this morning with dizziness getting out of bed. Uncertain loss of consciousness. Headache since yesterday. EXAM: MRI HEAD WITHOUT CONTRAST MRA HEAD WITHOUT CONTRAST TECHNIQUE: Multiplanar, multiecho pulse sequences of the brain and surrounding structures were obtained without intravenous contrast. Angiographic images of the head were obtained using MRA technique without contrast. COMPARISON:  09/03/2017 CT head. FINDINGS: MRI HEAD FINDINGS Brain: No acute infarction, hemorrhage, hydrocephalus, extra-axial collection or mass lesion. No structural or signal abnormality of the brain identified. Vascular: Normal flow voids. Skull and upper cervical spine: Normal marrow signal. Sinuses/Orbits: Moderate right sphenoid sinus mucosal thickening. No abnormal signal of mastoid air cells. Orbits are unremarkable. Other: None. MRA HEAD FINDINGS Internal carotid arteries:  Patent. Anterior cerebral arteries:  Patent. Middle cerebral arteries: Patent. Anterior communicating artery: Patent. Posterior communicating arteries:  Patent.  Fetal right PCA. Posterior cerebral arteries:  Patent. Basilar artery:  Patent. Vertebral arteries: Dominant vertebral artery. The right vertebral artery  largely terminates in the right PICA, normal variant. No evidence of high-grade stenosis, large vessel occlusion, or aneurysm. IMPRESSION: 1. No acute intracranial abnormality. Unremarkable MRI of the brain. 2. Moderate right sphenoid sinus disease. 3. Normal MRA of the head. Electronically Signed   By: Kristine Garbe M.D.   On: 10/04/2017 20:52   Mr Jodene Nam Head (cerebral Arteries)  Result Date: 10/04/2017 CLINICAL DATA:  67 y/o F; fall this morning with dizziness getting out of bed. Uncertain loss of consciousness. Headache since yesterday. EXAM: MRI HEAD WITHOUT CONTRAST MRA HEAD WITHOUT CONTRAST  TECHNIQUE: Multiplanar, multiecho pulse sequences of the brain and surrounding structures were obtained without intravenous contrast. Angiographic images of the head were obtained using MRA technique without contrast. COMPARISON:  09/03/2017 CT head. FINDINGS: MRI HEAD FINDINGS Brain: No acute infarction, hemorrhage, hydrocephalus, extra-axial collection or mass lesion. No structural or signal abnormality of the brain identified. Vascular: Normal flow voids. Skull and upper cervical spine: Normal marrow signal. Sinuses/Orbits: Moderate right sphenoid sinus mucosal thickening. No abnormal signal of mastoid air cells. Orbits are unremarkable. Other: None. MRA HEAD FINDINGS Internal carotid arteries:  Patent. Anterior cerebral arteries:  Patent. Middle cerebral arteries: Patent. Anterior communicating artery: Patent. Posterior communicating arteries:  Patent.  Fetal right PCA. Posterior cerebral arteries:  Patent. Basilar artery:  Patent. Vertebral arteries: Dominant vertebral artery. The right vertebral artery largely terminates in the right PICA, normal variant. No evidence of high-grade stenosis, large vessel occlusion, or aneurysm. IMPRESSION: 1. No acute intracranial abnormality. Unremarkable MRI of the brain. 2. Moderate right sphenoid sinus disease. 3. Normal MRA of the head. Electronically Signed   By:  Kristine Garbe M.D.   On: 10/04/2017 20:52    Procedures Procedures (including critical care time)  Medications Ordered in ED Medications - No data to display   Initial Impression / Assessment and Plan / ED Course  I have reviewed the triage vital signs and the nursing notes.  Pertinent labs & imaging results that were available during my care of the patient were reviewed by me and considered in my medical decision making (see chart for details).     Patient presenting for evaluation of dizziness, headache, and fall.  Physical exam reassuring, no obvious neurologic deficits.  However, patient reporting more persistent dizziness today than her baseline.  At higher risk for stroke due to nontreated melanoma.  Labs show mild leukocytosis at 12.2.  Otherwise reassuring.  Creatinine stable.  Urine pending.  Will obtain chest x-ray and urine to rule out infection.  Will obtain MRI due to worsening dizziness.  EKG without STEMI.  Chest x-ray viewed interpreted by me, no pneumonia, pneumothorax, effusion.  MRI without acute findings including stroke, swelling, tumor, or bleed. Shows possible sinus inflammation.  Urine without obvious infection.  Shows large leuks, no bacteria.  Patient without urinary symptoms.  Will not treat for UTI at this time. Urine cx sent.  On reassessment, patient reports dizziness has continued to improve. HA resolving. She is no longer feeling dizzy.  Discussed sinus inflammation.  Will treat with Flonase, meclizine given as needed for dizziness.  Patient to follow-up with her PCP regarding decreased appetite, weakness, and dizziness.  Patient encouraged to drink more water.  This time, patient appears safe for discharge.  Return precautions given.  Patient and daughter state they understand and agree to plan.   Final Clinical Impressions(s) / ED Diagnoses   Final diagnoses:  Dizziness  Fall, initial encounter  Acute nonintractable headache, unspecified  headache type    ED Discharge Orders         Ordered    fluticasone (FLONASE) 50 MCG/ACT nasal spray  Daily     10/04/17 2153    meclizine (ANTIVERT) 25 MG tablet  3 times daily PRN     10/04/17 2153           Franchot Heidelberg, PA-C 10/04/17 2316    Daleen Bo, MD 10/07/17 2216

## 2017-10-05 LAB — URINE CULTURE

## 2017-10-05 NOTE — Telephone Encounter (Signed)
Patient was hospitalized 8/16 - 8/19.  Contacted patient on 8/20 and left message @ 4:35 p.m. For patient to contact me back to set up a 2 wk follow up appointment for hospital f/u with Dr. Renold Genta.  Patient never called back.    Araceli then spoke with patient on 9/3.  See that message for further details.

## 2017-10-11 ENCOUNTER — Ambulatory Visit (INDEPENDENT_AMBULATORY_CARE_PROVIDER_SITE_OTHER): Payer: Medicare Other | Admitting: Internal Medicine

## 2017-10-11 ENCOUNTER — Encounter: Payer: Self-pay | Admitting: Internal Medicine

## 2017-10-11 VITALS — BP 90/60 | HR 78 | Temp 98.1°F | Ht 65.0 in | Wt 188.0 lb

## 2017-10-11 DIAGNOSIS — Z8639 Personal history of other endocrine, nutritional and metabolic disease: Secondary | ICD-10-CM

## 2017-10-11 DIAGNOSIS — R531 Weakness: Secondary | ICD-10-CM | POA: Diagnosis not present

## 2017-10-11 DIAGNOSIS — R8290 Unspecified abnormal findings in urine: Secondary | ICD-10-CM

## 2017-10-11 DIAGNOSIS — R42 Dizziness and giddiness: Secondary | ICD-10-CM

## 2017-10-11 DIAGNOSIS — R7302 Impaired glucose tolerance (oral): Secondary | ICD-10-CM

## 2017-10-11 DIAGNOSIS — I951 Orthostatic hypotension: Secondary | ICD-10-CM | POA: Diagnosis not present

## 2017-10-11 DIAGNOSIS — C4371 Malignant melanoma of right lower limb, including hip: Secondary | ICD-10-CM

## 2017-10-11 LAB — POCT URINALYSIS DIPSTICK
BILIRUBIN UA: NEGATIVE
Blood, UA: NEGATIVE
GLUCOSE UA: NEGATIVE
Ketones, UA: NEGATIVE
Nitrite, UA: NEGATIVE
Protein, UA: NEGATIVE
Spec Grav, UA: 1.01 (ref 1.010–1.025)
Urobilinogen, UA: 0.2 E.U./dL
pH, UA: 6.5 (ref 5.0–8.0)

## 2017-10-11 NOTE — Progress Notes (Signed)
Subjective:    Patient ID: Sarah [name], female    DOB: 08/27/1950, 68 y.o.   MRN: [id number]  HPI 67 year old Female with adrenal insufficiency thought to be due to [facility] for follow-up of episode of dizziness associated with a fall on September 16 resulting in emergency department evaluation.  MRI of the brain was normal without evidence of stroke or tumor.  She is on hydrocortisone orally per endocrinologist at [facility] for adrenal insufficiency.  At the time of her evaluation at the emergency department her white blood cell count was elevated at 12,200.  Patient says that she had reached up to get a towel out of a closet to take shower and became acutely dizzy.  Apparently about 1 month ago she had a white blood cell count of 3900.  She is been taken off of Nivolumab which was prescribed for stage IIb melanoma of right foot.  Patient has a history of bipolar disorder and currently seeing psychiatrist at [facility].  Patient has been depressed recently.  Her mother died earlier in the summer.  She was in her 62s and apparently suffered a stroke.  She had a female friend who lives in [place] that visited her from time to time who had COPD and he became ill and passed away.  Her daughter has GI issues, has gained a lot of weight and is considering bariatric surgery.  In October 2018 podiatrist performed right foot biopsy and diagnosis was malignant melanoma resolute 2.8 mm.  She had CT and showing no evidence of metastatic disease in the chest abdomen and pelvis in November 2018.  She also at that time had wide local excision was found to have invasive melanoma, acral lentiginous thickness 4.9 mm.  Right inguinal lymph node positive for melanoma x2.  In December 2018 was readmitted with right lower extremity soft tissue infection.  It was thought to have cellulitis based on MRI of the right foot.  In April 2019 MRI of the brain showed no evidence of tumor.  In May 2019 CT of  the abdomen chest and pelvis revealed stable changes in the right groin with no evidence of metastatic disease.     Had Hgb AIC of 6.1 % in August and was 6.9% one year previously Review of Systems weight loss from 225 to 188 pounds since July 2018. Now on Prozac instead of Lexapro per psychiatrist at [facility]. Trying to wean off Ambien. TSH in August was WNL.     Objective:   Physical Exam Neck is supple.  Chest clear.  Cardiac exam regular rate and rhythm.  Extremities without edema.       Assessment & Plan:  Orthostatic hypotension likely due to adrenal insufficiency.  Needs to continue with oral hydrocortisone.  Recently had fall related to dizziness which was likely orthostatic hypotension.  History of malignant melanoma right foot stage IIb with right inguinal lymph node x 2 intolerant of Nivolumab  Impaired glucose tolerance  Abnormal urine dipstick in the emergency department September 16.  Culture grew multiple species.  Dipstick UA today shows small LE and culture was sent today.   weight loss- continue to monitor  History of depression treated by psychiatrist at [facility] with Klonopin and Prozac  Grief reaction and situational stress due to loss of mother and female friend  GE reflux treated with omeprazole  History of essential hypertension currently on losartan and Demadex twice daily for history of dependent edema which she says [facility]  wants her to continue.  I think we should consider holding losartan since she is dizzy today.  Blood pressure drops from 583 systolically lying to 90 systolically standing.  She will need to continue to monitor blood pressure at home.

## 2017-10-11 NOTE — Patient Instructions (Addendum)
Hold losartan continue to monitor blood pressure.  Follow-up here in December for physical examination.  Urine culture sent

## 2017-10-12 LAB — URINE CULTURE
MICRO NUMBER:: 91139471
SPECIMEN QUALITY:: ADEQUATE

## 2017-10-13 ENCOUNTER — Telehealth: Payer: Self-pay | Admitting: Internal Medicine

## 2017-10-13 NOTE — Telephone Encounter (Signed)
Spoke with patient this morning; ask patient to discontinue the Losartan per Dr. Verlene Mayer orders to see if her BP improves as well as her dizziness.  She is to check her BP 1-2 times daily and record these readings and call with these readings in 3-4 days.    Patient states she will have to get a monitor.  She will do this and begin recording pressures and call back.

## 2017-10-17 DIAGNOSIS — Z9221 Personal history of antineoplastic chemotherapy: Secondary | ICD-10-CM | POA: Insufficient documentation

## 2017-10-18 ENCOUNTER — Telehealth: Payer: Self-pay | Admitting: Internal Medicine

## 2017-10-18 NOTE — Telephone Encounter (Signed)
Can see her tomorrow. Put note in to get orthostatic BPs on check in

## 2017-10-18 NOTE — Telephone Encounter (Signed)
Patient has stopped taking the Losartin and is calling with readings from Thursday until today.    Thursday:  5:45 p.m.  138/65    9:47 p.m.  131/63 Friday:  9/27  12:20 p.m.  176/79  3:15 p.m.  153/77     Saturday:  9/28  11:15 a.m.  112/78  4:00 p.m.  107/69    Sunday:  9/29   11:00 a.m.  102/62 Monday:  9/30  10:50 a.m.  143/86    States she has been keeping a headache that just won't go away.  She fell Saturday.  She had a shower and when she got out of the shower, she fell up against the vanity.  She hit her head against the vanity and she caught herself.  She showered about 2:00 p.m.  Her daughter stated that she was very cold.  She didn't have any complaints.  She was sore from the hit, but didn't have a bruise on the head.  Didn't hit any other part of her body.   She is still complaining of a dry, hacky cough, non productive.  Little to no congestion.  A little ear ache, but not much to speak of.  Under her eyes are sore, probably her sinuses.  No fever.  Little sore throat, maybe from drainage.  She took some Nyquil day care (gel caps) today (2 capsules).  The cough has been going on for a while, but it seems to be getting worse over the past 2 days.  Her daughter says she coughed all night last night.    Do you want to see her for the cough symptoms?      Phone:  [phone]

## 2017-10-19 ENCOUNTER — Ambulatory Visit (INDEPENDENT_AMBULATORY_CARE_PROVIDER_SITE_OTHER): Payer: Medicare Other | Admitting: Internal Medicine

## 2017-10-19 ENCOUNTER — Encounter: Payer: Self-pay | Admitting: Internal Medicine

## 2017-10-19 VITALS — BP 120/80 | HR 80 | Temp 98.0°F | Ht 65.0 in | Wt 172.0 lb

## 2017-10-19 DIAGNOSIS — C4371 Malignant melanoma of right lower limb, including hip: Secondary | ICD-10-CM | POA: Diagnosis not present

## 2017-10-19 DIAGNOSIS — I951 Orthostatic hypotension: Secondary | ICD-10-CM | POA: Diagnosis not present

## 2017-10-19 DIAGNOSIS — J22 Unspecified acute lower respiratory infection: Secondary | ICD-10-CM

## 2017-10-19 MED ORDER — CEFTRIAXONE SODIUM 1 G IJ SOLR
1.0000 g | Freq: Once | INTRAMUSCULAR | Status: AC
  Administered 2017-10-19: 1 g via INTRAMUSCULAR

## 2017-10-19 MED ORDER — BENZONATATE 100 MG PO CAPS: 100.0000 mg | ORAL_CAPSULE | Freq: Three times a day (TID) | ORAL | 1 refills | Status: DC | PRN

## 2017-10-19 MED ORDER — AZITHROMYCIN 250 MG PO TABS: ORAL_TABLET | ORAL | 0 refills | Status: DC

## 2017-10-19 NOTE — Progress Notes (Signed)
   Subjective:    Patient ID: Monzerat Handler, female    DOB: 1950-10-13, 67 y.o.   MRN: [id number]  HPI 67 year old Female in today with complaint of cough for several days.  Was seen in late September diagnosed with orthostatic hypotension.  Seems to be feeling better and staying well-hydrated.  Does continue to have orthostatic drop with position change from lying to standing.  Was seen by Oncology at [facility] September 25.  Had 10 cycles of Nivolumab from [facility] 2019.  History of melanoma of right foot.  Stage III.  Breslow 2.8 mm with no evidence of metastatic disease in chest abdomen and pelvis.  Had wide local excision with positive right inguinal lymph node x2    Review of Systems no fever or shaking chills     Objective:   Physical Exam Skin warm and dry.  Cervical nodes: None.  Neck is supple.  Chest clear to auscultation without rales or wheezing.  TMs and pharynx are clear.       Assessment & Plan:  Acute lower respiratory infection present for several days  Plan: Rocephin 1 g IM.  Zithromax 2 p.o. day 1 followed by 1 p.o. days 2 through 5.  Tessalon Perles 100 mg 3 times a day as needed for cough.  Patient request referral to local neurologist for peripheral neuropathy as neurologist at [facility] has left.

## 2017-10-19 NOTE — Patient Instructions (Addendum)
Rocephin one gram IM. Zithromax 2 po day 1 followed by one po days 2-5. Pt requests referral for peripheral neuropathy as Neurologist at [facility] has left. Lavella Lemons perles 100 mg 3 times a day as needed for cough

## 2017-10-20 NOTE — Telephone Encounter (Signed)
Patient was scheduled for 10/1 at 4:00 p.m.

## 2017-11-13 ENCOUNTER — Other Ambulatory Visit: Payer: Self-pay | Admitting: Internal Medicine

## 2017-11-16 IMAGING — MG 2D DIGITAL SCREENING BILATERAL MAMMOGRAM WITH CAD AND ADJUNCT TO
8 of 13 series · 8 of 29 positions shown · non-contrast
Comparison: Previous exam(s).

ACR Breast Density Category a: The breast tissue is almost entirely
fatty.

CLINICAL DATA: Screening.

EXAM:
2D DIGITAL SCREENING BILATERAL MAMMOGRAM WITH CAD AND ADJUNCT TOMO

[R CC (1 of 2)]
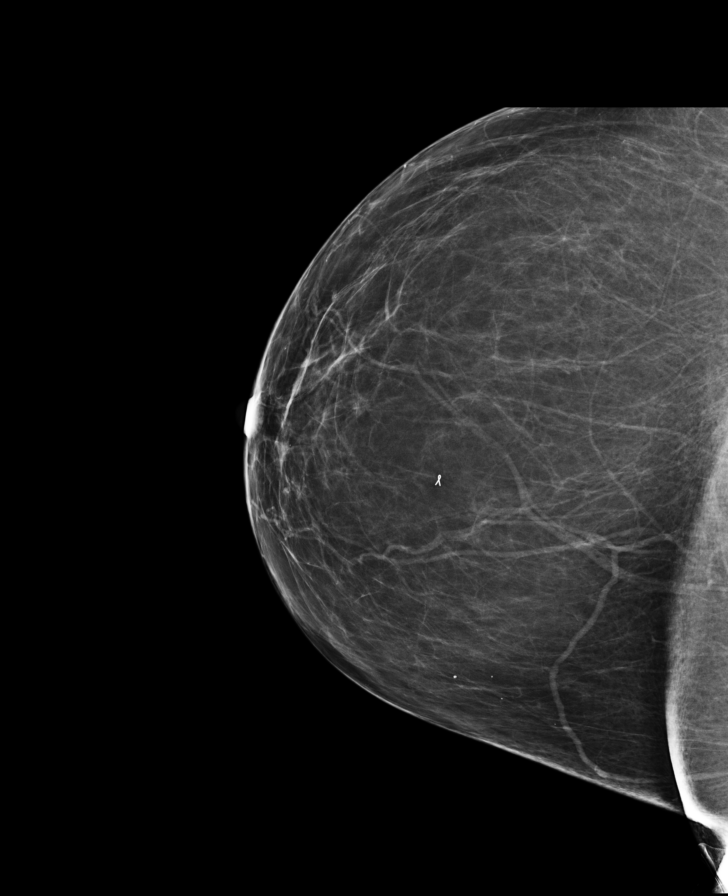

[R MLO]
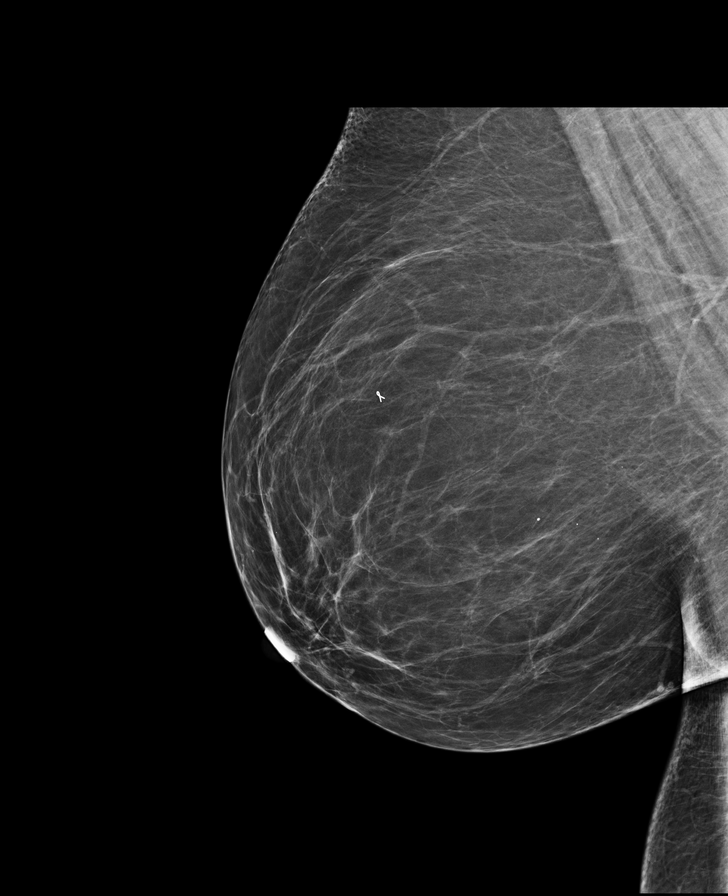

[L MLO]
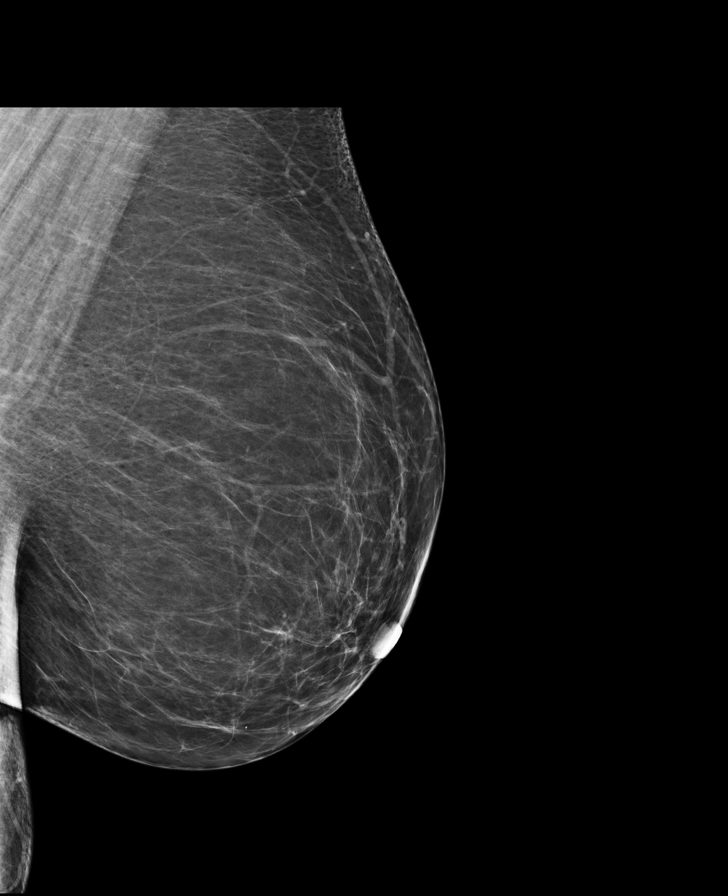

[R CC synth-2D]
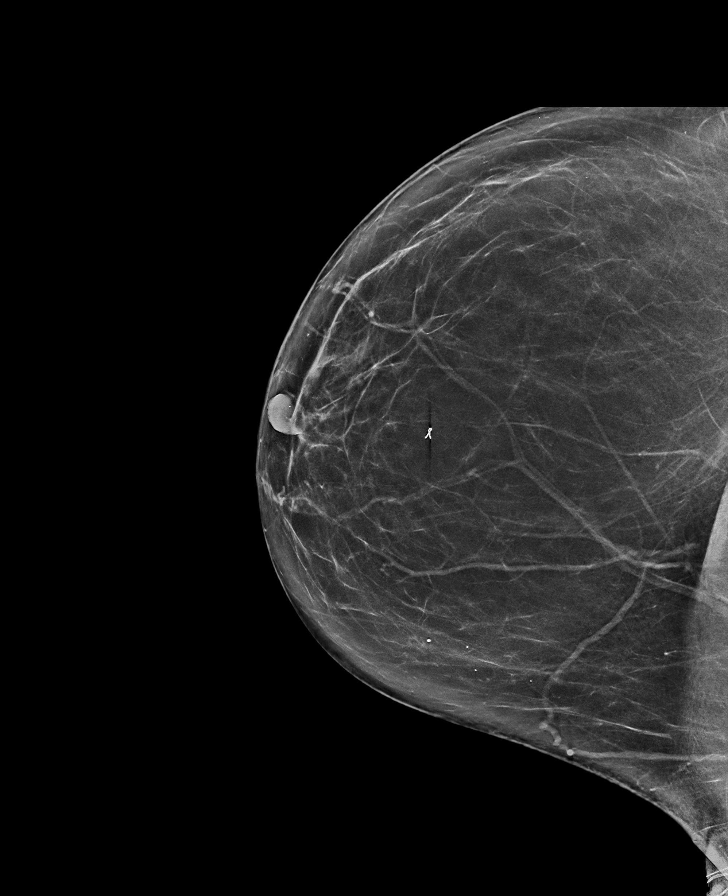

[R CC (2 of 2)]
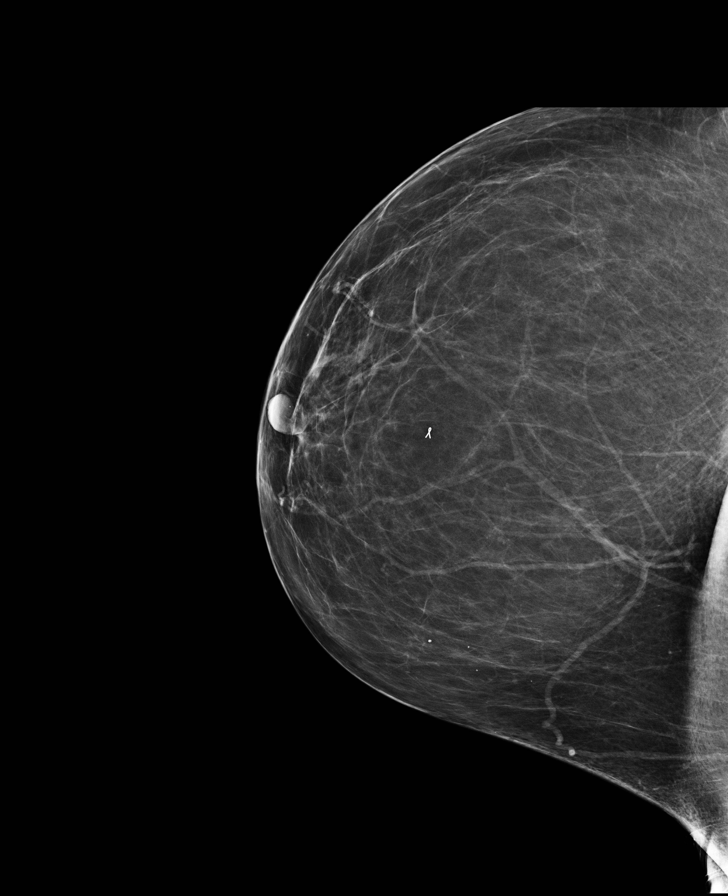

[R MLO synth-2D]
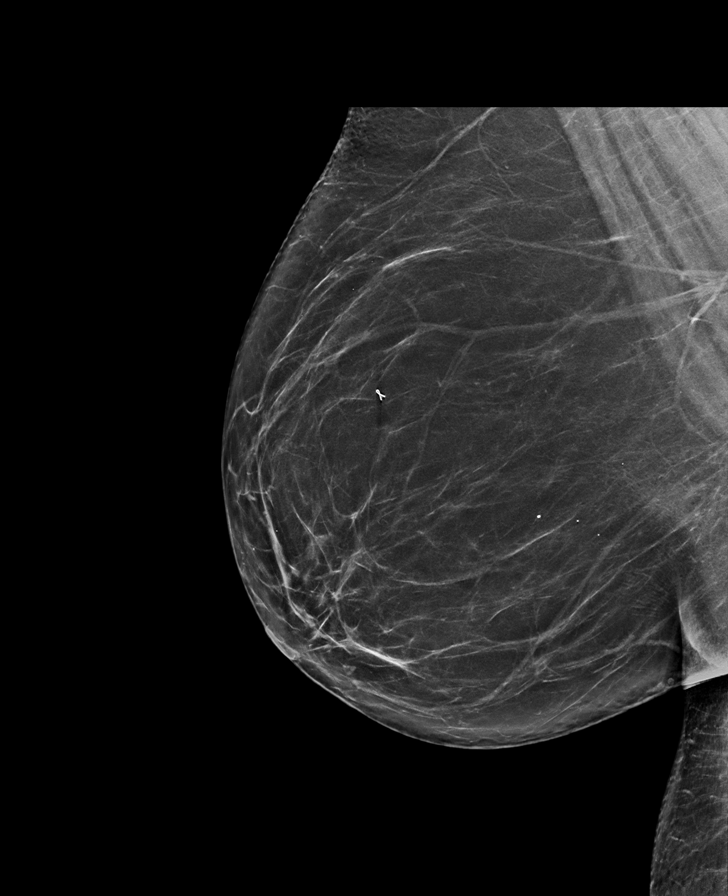

[L CC]
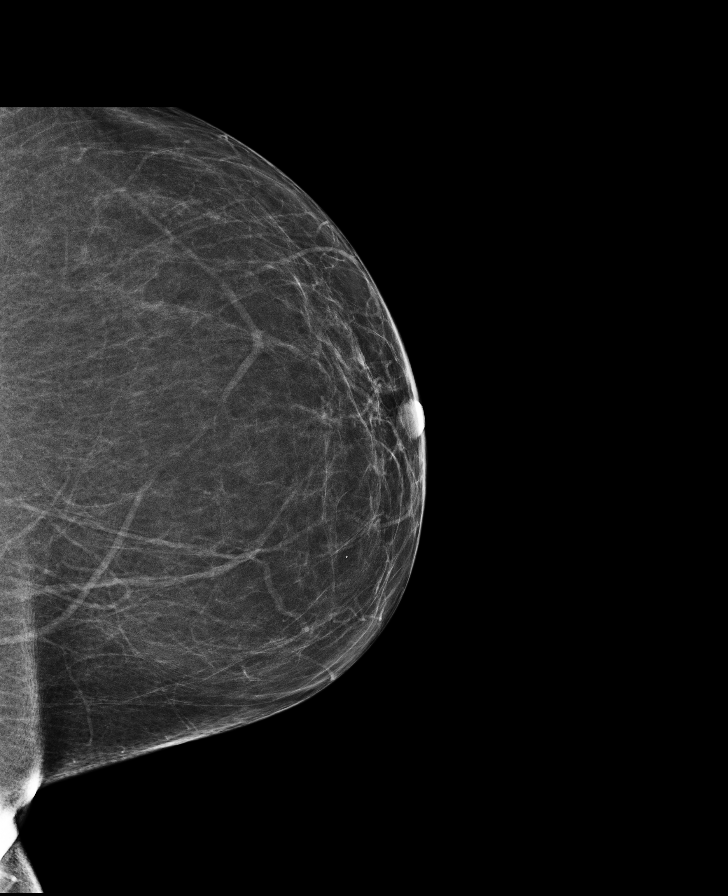

[L CC synth-2D]
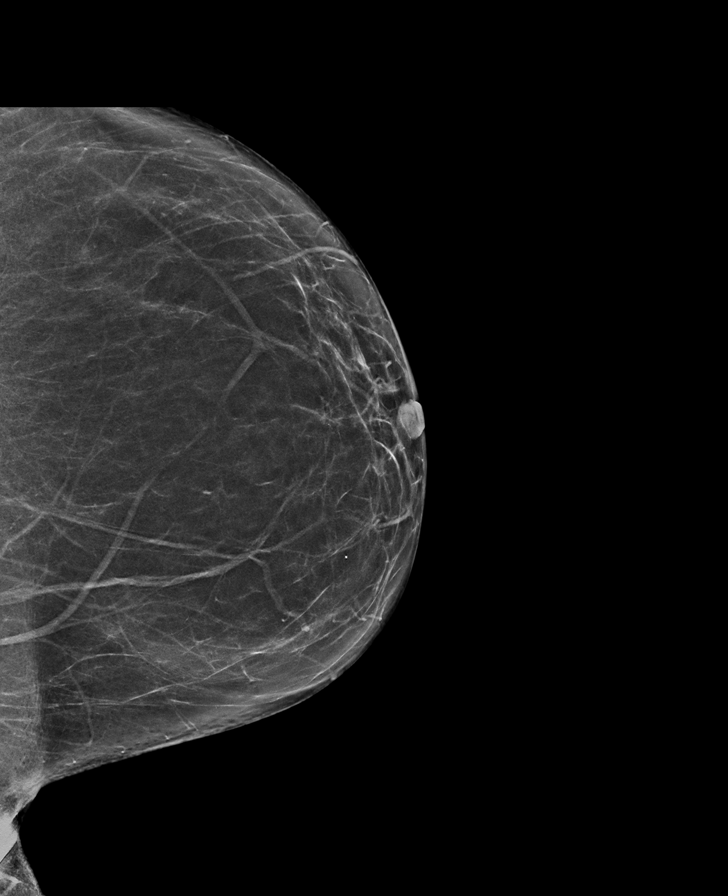

[8 of 29 positions shown; findings below may reference images not displayed]

FINDINGS: There are no findings suspicious for malignancy. Images were
processed with CAD.
IMPRESSION: No mammographic evidence of malignancy. A result letter of this
screening mammogram will be mailed directly to the patient.

RECOMMENDATION:
Screening mammogram in one year. (Code:1B-X-8P0)

BI-RADS CATEGORY  1: Negative.

## 2017-11-18 ENCOUNTER — Other Ambulatory Visit: Payer: Self-pay | Admitting: Internal Medicine

## 2017-12-17 ENCOUNTER — Other Ambulatory Visit: Payer: Self-pay

## 2017-12-17 NOTE — Patient Outreach (Signed)
[facility]) Care Management  12/17/2017  Sarah [name] 23-Jun-1950 [id number]   Medication Adherence call to Sarah [name] left a message fro patient to call back patient is due on Losartan 50 mg under [place].   [facility] Management Direct Dial [phone]  Fax [phone] [name]

## 2018-01-09 ENCOUNTER — Other Ambulatory Visit: Payer: Self-pay | Admitting: Internal Medicine

## 2018-01-10 ENCOUNTER — Encounter: Payer: Self-pay | Admitting: Internal Medicine

## 2018-01-10 ENCOUNTER — Ambulatory Visit (INDEPENDENT_AMBULATORY_CARE_PROVIDER_SITE_OTHER): Payer: Medicare Other | Admitting: Internal Medicine

## 2018-01-10 VITALS — BP 120/80 | HR 72 | Temp 98.0°F | Ht 64.0 in | Wt 185.0 lb

## 2018-01-10 DIAGNOSIS — Z6838 Body mass index (BMI) 38.0-38.9, adult: Secondary | ICD-10-CM

## 2018-01-10 DIAGNOSIS — R7302 Impaired glucose tolerance (oral): Secondary | ICD-10-CM

## 2018-01-10 DIAGNOSIS — J029 Acute pharyngitis, unspecified: Secondary | ICD-10-CM | POA: Diagnosis not present

## 2018-01-10 DIAGNOSIS — I10 Essential (primary) hypertension: Secondary | ICD-10-CM

## 2018-01-10 DIAGNOSIS — Z0000 Encounter for general adult medical examination without abnormal findings: Secondary | ICD-10-CM | POA: Diagnosis not present

## 2018-01-10 DIAGNOSIS — Z85820 Personal history of malignant melanoma of skin: Secondary | ICD-10-CM

## 2018-01-10 DIAGNOSIS — E785 Hyperlipidemia, unspecified: Secondary | ICD-10-CM

## 2018-01-10 DIAGNOSIS — I951 Orthostatic hypotension: Secondary | ICD-10-CM

## 2018-01-10 DIAGNOSIS — E7800 Pure hypercholesterolemia, unspecified: Secondary | ICD-10-CM

## 2018-01-10 DIAGNOSIS — F321 Major depressive disorder, single episode, moderate: Secondary | ICD-10-CM

## 2018-01-10 DIAGNOSIS — E2839 Other primary ovarian failure: Secondary | ICD-10-CM

## 2018-01-10 DIAGNOSIS — E271 Primary adrenocortical insufficiency: Secondary | ICD-10-CM

## 2018-01-10 DIAGNOSIS — E8881 Metabolic syndrome: Secondary | ICD-10-CM

## 2018-01-10 LAB — POCT URINALYSIS DIPSTICK
Appearance: NEGATIVE
Bilirubin, UA: NEGATIVE
Blood, UA: NEGATIVE
Glucose, UA: NEGATIVE
KETONES UA: NEGATIVE
LEUKOCYTES UA: NEGATIVE
NITRITE UA: NEGATIVE
ODOR: NEGATIVE
PH UA: 6 (ref 5.0–8.0)
PROTEIN UA: NEGATIVE
SPEC GRAV UA: 1.01 (ref 1.010–1.025)
UROBILINOGEN UA: 0.2 U/dL

## 2018-01-10 LAB — POCT RAPID STREP A (OFFICE): Rapid Strep A Screen: NEGATIVE

## 2018-01-10 MED ORDER — SIMVASTATIN 10 MG PO TABS: 10.0000 mg | ORAL_TABLET | Freq: Every day | ORAL | 3 refills | Status: DC

## 2018-01-10 NOTE — Progress Notes (Signed)
Subjective:    Patient ID: Sarah [name], female    DOB: [birth year], 67 y.o.   MRN: [id number]  HPI 67 year old Female  For Medicare wellness, health maintenance exam and evaluattion of medical issues.  Hx stage III melanoma diagnosed October 2018 after podiatrist performed a right foot biopsy on lesion.  Breslow 2.8 mm with ulceration.  Treated at [facility] with wide local excision, took Nivolumab x 10 treatments.  No evidence of metastatic disease in chest abdomen or pelvis.  Had right inguinal node positive for melanoma x2.  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  Continues to have frequent CT surveillance.  Sees psychiatrist at [facility] for depression.  Notes from [facility] indicate that Prozac may be not as effective for depression.  History of left shoulder arthropathy treated in the fall with cortisone injection with some improvement.  Peripheral neuropathy- due for testing at [facility] soon.  Has had steroid neck injections at [facility] clinic with history of cervical disc disease.  Has been tried on Lyrica and gabapentin by physiatrist at [facility] around 2018  Has had knee injections at [place]  Has gained 13 pounds since October.   Had orthostasis Sept 2019. No more episodes.  Fasting labs drawn and pending.  Needs mammogram.  Last mammogram on file was 2018  Needs bone density study  History of bipolar disorder used to be treated by Dr. Letta Moynahan who retired from [employer] and then saw Jill Poling nurse practitioner.  Now being seen at [facility].  History of type 2 diabetes mellitus, hypertension, obesity, hyperlipidemia, dependent edema, metabolic syndrome, hypokalemia secondary to diuretic therapy.  Has had multiple suicide attempts by overdose.  Last attempt was August 2012.  Hospitalized April 2011 with drug overdose including clonazepam, Ambien, gabapentin.  Has been depressed since her husband died of complications of  pancreatic cancer in 2002.  In July 2003 she overdosed on Ambien, Restoril, Seroquel.  In 2004 she was hospitalized for 3 days at [facility].  She is allergic to Penicillin  She does not smoke or consume alcohol  Several years ago she was hospitalized for chest pain at [facility] where MI was ruled out.  Bilateral tubal ligation 1982, D&C 1989, laparoscopic cholecystectomy 1994, lumbar fusion 2003, hysterectomy with BSO 2005, fractured left elbow 2006  She was under the care of Dr Elta Guadeloupe. Phillips for chronic pain management for a long time.  She did have lumbar fusion in 2003 for chronic back pain by Dr. Ellene Route and that did seem to help her chronic back pain.  She slipped on some steps at work in 2000 and began to have severe back problems which subsequently resulted in her obtaining disability benefits.  Social history: She has 1 adult daughter and 1 grandson.  Daughter has had some health problems.  Patient remarried after her husband died but that second marriage did not work out.  She is now divorced.  Formerly worked for [employer]  Family history: Mother resided in [place] died approximately  2 years ago.  There were some issues settling her estate.  Mother had history of bipolar disorder and dementia with history of CABG.  Father died with lung cancer but was a non-smoker.  One brother with history of lupus.  One sister with history of COPD.        Review of Systems  Constitutional: Positive for fatigue.  Respiratory: Negative.   Cardiovascular: Negative.   Gastrointestinal: Negative.  Genitourinary: Negative.   Neurological:       Complaining of paresthesias in feet  Psychiatric/Behavioral: Positive for dysphoric mood. Negative for suicidal ideas.       Affect is flat       Objective:   Physical Exam Vitals signs reviewed.  Constitutional:      General: She is not in acute distress.     Appearance: Normal appearance. She is obese. She is not ill-appearing, toxic-appearing or diaphoretic.  HENT:     Head: Normocephalic and atraumatic.     Right Ear: Tympanic membrane normal.     Left Ear: Tympanic membrane normal.     Nose: Nose normal. No congestion.     Mouth/Throat:     Mouth: Mucous membranes are moist.     Pharynx: Oropharynx is clear.  Eyes:     General: No scleral icterus.       Right eye: No discharge.        Left eye: No discharge.     Extraocular Movements: Extraocular movements intact.     Conjunctiva/sclera: Conjunctivae normal.     Pupils: Pupils are equal, round, and reactive to light.  Neck:     Musculoskeletal: Neck supple. No neck rigidity.     Vascular: No carotid bruit.  Cardiovascular:     Rate and Rhythm: Normal rate and regular rhythm.     Pulses: Normal pulses.     Heart sounds: Normal heart sounds. No murmur.     Comments: Breasts normal female without masses Pulmonary:     Effort: Pulmonary effort is normal.     Breath sounds: Normal breath sounds. No wheezing.  Abdominal:     General: Bowel sounds are normal.     Palpations: Abdomen is soft. There is no mass.     Tenderness: There is no abdominal tenderness. There is no rebound.  Genitourinary:    Comments: Status post hysterectomy BSO 2005 Musculoskeletal:     Right lower leg: No edema.     Left lower leg: No edema.  Lymphadenopathy:     Cervical: No cervical adenopathy.  Skin:    General: Skin is warm and dry.  Neurological:     General: No focal deficit present.     Mental Status: She is alert and oriented to person, place, and time.     Cranial Nerves: No cranial nerve deficit.     Sensory: No sensory deficit.     Motor: No weakness.     Coordination: Coordination normal.  Psychiatric:        Behavior: Behavior normal.        Thought Content: Thought content normal.        Judgment: Judgment normal.     Comments: Affect is flat           Assessment & Plan:    History of melanoma treated at [facility] stage III with positive lymph nodes in right inguinal area x2 status post 10 chemotherapy treatments  Peripheral neuropathy-likely related to chemotherapy  History of depression longstanding with history of bipolar disorder-now seeing psychiatrist at [place] hypertension-stable on current regimen  History of dependent edema-not present today but is on diuretic and potassium therapy  History of hypokalemia secondary to diuretic therapy  Controlled type 2 diabetes mellitus-encourage diet exercise and weight loss  History of left shoulder arthropathy  Family history of coronary artery disease in mother  Orthostasis-diagnosed with adrenal insufficiency at [facility] in November and now on oral cortisone.  Prior to that was having syncope and falls.  Has improved.  Had follow-up in August 2019 with syncope.  MRI was done at [facility] which was negative.  Plan: Continue current treatment and follow-up in 6 to 12 months.  She will continue to be seen and carefully followed in [facility].  Subjective:   Patient presents for Medicare Annual/Subsequent preventive examination.  Review Past Medical/Family/Social: See above   Risk Factors  Current exercise habits: Sedentary Dietary issues discussed: Low-fat low carbohydrate  Cardiac risk factors: Family history in mother, hyperlipidemia, impaired glucose tolerance  Depression Screen  (Note: if answer to either of the following is "Yes", a more complete depression screening is indicated)   Over the past two weeks, have you felt down, depressed or hopeless?  Yes Over the past two weeks, have you felt little interest or pleasure in doing things?  Yes Have you lost interest or pleasure in daily life?  Yes Do you often feel hopeless?  Yes Do you cry easily over simple problems? No   Activities of Daily Living  In your present state of health, do you have any  difficulty performing the following activities?:   Driving? No  Managing money? No  Feeding yourself? No  Getting from bed to chair? No  Climbing a flight of stairs? No  Preparing food and eating?: No  Bathing or showering? No  Getting dressed: No  Getting to the toilet? No  Using the toilet:No  Moving around from place to place: No  In the past year have you fallen or had a near fall?:  Yes August 2019 associated with orthostasis Are you sexually active? No  Do you have more than one partner? No   Hearing Difficulties: No  Do you often ask people to speak up or repeat themselves?  Yes Do you experience ringing or noises in your ears? No  Do you have difficulty understanding soft or whispered voices?  Yes do you feel that you have a problem with memory? No Do you often misplace items? No    Home Safety:  Do you have a smoke alarm at your residence? Yes Do you have grab bars in the bathroom?  Yes Do you have throw rugs in your house?  Yes   Cognitive Testing  Alert? Yes Normal Appearance?Yes  Oriented to person? Yes Place? Yes  Time? Yes  Recall of three objects? Yes  Can perform simple calculations? Yes  Displays appropriate judgment?Yes  Can read the correct time from a watch face?Yes   List the Names of Other Physician/Practitioners you currently use:  See referral list for the physicians patient is currently seeing.  Endocrinologist at [facility], oncologist at [place]: See above   Objective:     General appearance: Appears stated age and obese  Head: Normocephalic, without obvious abnormality, atraumatic  Eyes: conj clear, EOMi PEERLA  Ears: normal TM's and external ear canals both ears  Nose: Nares normal. Septum midline. Mucosa normal. No drainage or sinus tenderness.  Throat: lips, mucosa, and tongue normal; teeth and gums normal  Neck: no adenopathy, no carotid bruit, no JVD, supple, symmetrical, trachea midline and  thyroid not enlarged, symmetric, no tenderness/mass/nodules  No CVA tenderness.  Lungs: clear to auscultation bilaterally  Breasts: normal appearance, no masses or tenderness Heart: regular rate and rhythm, S1, S2 normal, no murmur, click, rub or gallop  Abdomen: soft, non-tender; bowel sounds normal; no masses, no organomegaly  Musculoskeletal: ROM normal in all joints, no crepitus, no deformity, Normal muscle strengthen. Back  is symmetric, no curvature. Skin: Skin color, texture, turgor normal. No rashes or lesions  Lymph nodes: Cervical, supraclavicular, and axillary nodes normal.  Neurologic: CN 2 -12 Normal, Normal symmetric reflexes. Normal coordination and gait  Psych: Alert & Oriented x 3, Mood appear stable.    Assessment:    Annual wellness medicare exam   Plan:    During the course of the visit the patient was educated and counseled about appropriate screening and preventive services including:   Have recommended annual flu vaccine  Had Prevnar 13 in 2017, had Zostavax vaccine in 2015, had Tdap vaccine in 2015  We will need Oncology permission to repeat pneumococcal vaccines  Needs to have mammogram .  Last one on file 2018.  New order placed.  History of falls-related to orthostasis and adrenal insufficiency.  Have ordered bone density study  Needs colon cancer screening-cannot see recent colonoscopy     Patient Instructions (the written plan) was given to the patient.  Medicare Attestation  I have personally reviewed:  The patient's medical and social history  Their use of alcohol, tobacco or illicit drugs  Their current medications and supplements  The patient's functional ability including ADLs,fall risks, home safety risks, cognitive, and hearing and visual impairment  Diet and physical activities  Evidence for depression or mood disorders  The patient's weight, height, BMI, and visual acuity have been recorded in the chart. I have made referrals,  counseling, and provided education to the patient based on review of the above and I have provided the patient with a written personalized care plan for preventive services.

## 2018-01-11 LAB — CBC WITH DIFFERENTIAL/PLATELET
Absolute Monocytes: 756 cells/uL (ref 200–950)
Basophils Absolute: 101 cells/uL (ref 0–200)
Basophils Relative: 0.8 %
EOS ABS: 365 {cells}/uL (ref 15–500)
Eosinophils Relative: 2.9 %
HCT: 38.1 % (ref 35.0–45.0)
Hemoglobin: 13.1 g/dL (ref 11.7–15.5)
Lymphs Abs: 1474 cells/uL (ref 850–3900)
MCH: 30.3 pg (ref 27.0–33.0)
MCHC: 34.4 g/dL (ref 32.0–36.0)
MCV: 88.2 fL (ref 80.0–100.0)
MPV: 10.5 fL (ref 7.5–12.5)
Monocytes Relative: 6 %
Neutro Abs: 9904 cells/uL — ABNORMAL HIGH (ref 1500–7800)
Neutrophils Relative %: 78.6 %
Platelets: 304 10*3/uL (ref 140–400)
RBC: 4.32 10*6/uL (ref 3.80–5.10)
RDW: 13 % (ref 11.0–15.0)
Total Lymphocyte: 11.7 %
WBC: 12.6 10*3/uL — ABNORMAL HIGH (ref 3.8–10.8)

## 2018-01-11 LAB — HEMOGLOBIN A1C
EAG (MMOL/L): 7 (calc)
HEMOGLOBIN A1C: 6 %{Hb} — AB (ref <5.7)
MEAN PLASMA GLUCOSE: 126 (calc)

## 2018-01-11 LAB — COMPLETE METABOLIC PANEL WITH GFR
AG Ratio: 1.7 (calc) (ref 1.0–2.5)
ALBUMIN MSPROF: 3.8 g/dL (ref 3.6–5.1)
ALT: 18 U/L (ref 6–29)
AST: 10 U/L (ref 10–35)
Alkaline phosphatase (APISO): 74 U/L (ref 33–130)
BUN: 17 mg/dL (ref 7–25)
CO2: 33 mmol/L — AB (ref 20–32)
Calcium: 8.8 mg/dL (ref 8.6–10.4)
Chloride: 98 mmol/L (ref 98–110)
Creat: 0.83 mg/dL (ref 0.50–0.99)
GFR, EST AFRICAN AMERICAN: 85 mL/min/{1.73_m2} (ref >=60)
GFR, Est Non African American: 73 mL/min/{1.73_m2} (ref >=60)
GLOBULIN: 2.2 g/dL (ref 1.9–3.7)
Glucose, Bld: 114 mg/dL — ABNORMAL HIGH (ref 65–99)
Potassium: 3.6 mmol/L (ref 3.5–5.3)
SODIUM: 140 mmol/L (ref 135–146)
TOTAL PROTEIN: 6 g/dL — AB (ref 6.1–8.1)
Total Bilirubin: 0.7 mg/dL (ref 0.2–1.2)

## 2018-01-11 LAB — LIPID PANEL
CHOLESTEROL: 323 mg/dL — AB (ref <200)
HDL: 52 mg/dL (ref >50)
LDL CHOLESTEROL (CALC): 231 mg/dL — AB
Non-HDL Cholesterol (Calc): 271 mg/dL (calc) — ABNORMAL HIGH (ref <130)
TRIGLYCERIDES: 213 mg/dL — AB (ref <150)
Total CHOL/HDL Ratio: 6.2 (calc) — ABNORMAL HIGH (ref <5.0)

## 2018-01-11 LAB — MICROALBUMIN / CREATININE URINE RATIO
Creatinine, Urine: 14 mg/dL — ABNORMAL LOW (ref 20–275)
Microalb, Ur: <0.2 mg/dL

## 2018-01-11 LAB — TSH: TSH: 1.41 m[IU]/L (ref 0.40–4.50)

## 2018-02-01 DIAGNOSIS — E538 Deficiency of other specified B group vitamins: Secondary | ICD-10-CM | POA: Insufficient documentation

## 2018-02-01 DIAGNOSIS — G6289 Other specified polyneuropathies: Secondary | ICD-10-CM | POA: Insufficient documentation

## 2018-02-11 ENCOUNTER — Encounter: Payer: Self-pay | Admitting: Internal Medicine

## 2018-02-11 NOTE — Patient Instructions (Signed)
Please try to diet exercise and lose weight.  Return in 6 months.  Have mammogram and bone density studies.  Please ask oncologist at [facility] if you can have pneumococcal vaccines.

## 2018-05-10 ENCOUNTER — Other Ambulatory Visit: Payer: Medicare Other

## 2018-05-10 ENCOUNTER — Ambulatory Visit: Payer: Medicare Other

## 2018-05-12 ENCOUNTER — Telehealth: Payer: Self-pay | Admitting: Internal Medicine

## 2018-05-12 NOTE — Telephone Encounter (Signed)
LVM to CB and schedule 6 month fu due in June

## 2018-06-27 ENCOUNTER — Ambulatory Visit: Payer: Medicare Other

## 2018-06-27 ENCOUNTER — Other Ambulatory Visit: Payer: Medicare Other

## 2018-07-04 ENCOUNTER — Telehealth: Payer: Self-pay | Admitting: Internal Medicine

## 2018-07-04 NOTE — Telephone Encounter (Signed)
Needs to make appt fro December CPE before refilling diuretic. Pharmacy informed

## 2018-07-04 NOTE — Telephone Encounter (Signed)
Schedule 6 month fu

## 2018-07-04 NOTE — Telephone Encounter (Signed)
Scheduled 6 month fu and CPE

## 2018-07-11 ENCOUNTER — Encounter: Payer: Self-pay | Admitting: Internal Medicine

## 2018-07-11 ENCOUNTER — Ambulatory Visit (INDEPENDENT_AMBULATORY_CARE_PROVIDER_SITE_OTHER): Payer: Medicare Other | Admitting: Internal Medicine

## 2018-07-11 ENCOUNTER — Other Ambulatory Visit: Payer: Self-pay | Admitting: Internal Medicine

## 2018-07-11 ENCOUNTER — Other Ambulatory Visit: Payer: Self-pay

## 2018-07-11 VITALS — BP 130/80 | Ht 64.0 in | Wt 196.0 lb

## 2018-07-11 DIAGNOSIS — R7302 Impaired glucose tolerance (oral): Secondary | ICD-10-CM

## 2018-07-11 DIAGNOSIS — E8881 Metabolic syndrome: Secondary | ICD-10-CM

## 2018-07-11 DIAGNOSIS — Z0000 Encounter for general adult medical examination without abnormal findings: Secondary | ICD-10-CM | POA: Diagnosis not present

## 2018-07-11 DIAGNOSIS — E876 Hypokalemia: Secondary | ICD-10-CM

## 2018-07-11 DIAGNOSIS — I10 Essential (primary) hypertension: Secondary | ICD-10-CM | POA: Diagnosis not present

## 2018-07-11 DIAGNOSIS — F3176 Bipolar disorder, in full remission, most recent episode depressed: Secondary | ICD-10-CM

## 2018-07-11 DIAGNOSIS — Z85820 Personal history of malignant melanoma of skin: Secondary | ICD-10-CM

## 2018-07-11 DIAGNOSIS — E7800 Pure hypercholesterolemia, unspecified: Secondary | ICD-10-CM

## 2018-07-11 DIAGNOSIS — R609 Edema, unspecified: Secondary | ICD-10-CM

## 2018-07-11 DIAGNOSIS — F321 Major depressive disorder, single episode, moderate: Secondary | ICD-10-CM

## 2018-07-11 NOTE — Progress Notes (Signed)
   Subjective:    Patient ID: Sarah [name], female    DOB: Apr 17, 1950, 68 y.o.   MRN: [id number]  HPI In today for 6 month follow up on multiple issues. Says melanoma follow up at [facility] continues. No recent changes.  History of impaired glucose tolerance  History of depression and bipolar disorder  Longstanding history of obesity  Hx of hypertension, hyperlipidemia, dependent edema. Has not been exercising. Would at least like for her to walk some.  TSH normal at 2.98.  LDL cholesterol 108, glucose 108, BUN and creatinine are normal, hemoglobin A1c 6.2%.  Affect seems normal.  No significant depression despite the pandemic.  Some stress with daughter who has had gastric bypass surgery recently.  Blood pressure stable on current regimen  History of hypokalemia stable on potassium supplement  Medications reviewed including Ambien at bedtime, Zocor 10 mg daily, Cozaar 50 mg daily, gabapentin for peripheral neuropathy, Lasix 40 mg daily for hypertension and dependent edema, Catapres twice daily for hypertension    Review of Systems no new complaints     Objective:   Physical Exam Blood pressure 130/80.  Weight 196 pounds.  BMI 33.64  Skin warm and dry.  No thyromegaly.  No carotid bruits.  Chest clear to auscultation.  Cardiac exam regular rate and rhythm normal S1 and S2.  Trace lower extremity edema without pitting.     Assessment & Plan:  BMI 33.64-encourage diet exercise and weight loss  Essential hypertension-stable on current regimen  Peripheral neuropathy likely related to chemotherapy for melanoma  History of depression longstanding with history of bipolar disorder now seeing psychiatrist today  Dependent edema stable on diuretic and potassium therapy  History of hypokalemia secondary to diuretic therapy on potassium therapy  History of left shoulder arthropathy  History of melanoma treated at [facility] stage III with positive lymph nodes in the right  inguinal area x2 status post chemotherapy treatments  Family history of coronary artery disease in mother  Plan: Encourage diet exercise and weight loss and continue current medications.  Follow-up in December for annual health maintenance exam and Medicare wellness visit.  30 minutes spent with patient.

## 2018-07-13 LAB — BASIC METABOLIC PANEL
BUN: 10 mg/dL (ref 7–25)
CO2: 23 mmol/L (ref 20–32)
Calcium: 9.2 mg/dL (ref 8.6–10.4)
Chloride: 107 mmol/L (ref 98–110)
Creat: 0.91 mg/dL (ref 0.50–0.99)
Glucose, Bld: 108 mg/dL — ABNORMAL HIGH (ref 65–99)
Potassium: 3.9 mmol/L (ref 3.5–5.3)
Sodium: 141 mmol/L (ref 135–146)

## 2018-07-13 LAB — LIPID PANEL
Cholesterol: 190 mg/dL (ref <200)
HDL: 61 mg/dL (ref >=50)
LDL Cholesterol (Calc): 108 mg/dL (calc) — ABNORMAL HIGH
Non-HDL Cholesterol (Calc): 129 mg/dL (calc) (ref <130)
Total CHOL/HDL Ratio: 3.1 (calc) (ref <5.0)
Triglycerides: 110 mg/dL (ref <150)

## 2018-07-13 LAB — HEMOGLOBIN A1C
Hgb A1c MFr Bld: 6.2 % of total Hgb — ABNORMAL HIGH (ref <5.7)
Mean Plasma Glucose: 131 (calc)
eAG (mmol/L): 7.3 (calc)

## 2018-07-13 LAB — HOUSE ACCOUNT TRACKING

## 2018-07-13 LAB — TSH: TSH: 2.98 mIU/L (ref 0.40–4.50)

## 2018-07-14 ENCOUNTER — Telehealth: Payer: Self-pay | Admitting: Internal Medicine

## 2018-07-14 MED ORDER — FUROSEMIDE 40 MG PO TABS: 40.0000 mg | ORAL_TABLET | Freq: Every day | ORAL | 3 refills | Status: DC

## 2018-07-14 NOTE — Telephone Encounter (Signed)
Sarah [name] [phone]  [facility] [address] [address]  [name] called about a prescription being called in from her visit on Monday for Lassix.

## 2018-07-14 NOTE — Telephone Encounter (Signed)
Please discontinue Torsemide and prescribe Lasix 40 mg daily

## 2018-07-16 NOTE — Patient Instructions (Addendum)
It was a pleasure to see you today.  Please work on diet exercise and weight loss.  Continue current medications.  Glucose intolerance is stable and his blood pressure.  Lipids are stable.

## 2018-09-14 ENCOUNTER — Telehealth: Payer: Self-pay | Admitting: Internal Medicine

## 2018-09-14 NOTE — Telephone Encounter (Signed)
LVM for [place] to CB

## 2018-09-14 NOTE — Telephone Encounter (Signed)
Sarah [name] [phone]  [name] called to see if she needs to contact doctor about colonoscopy since her CT at [facility] or are we contacting them.

## 2018-09-14 NOTE — Telephone Encounter (Signed)
If she wants to go to [facility], I need for her to find [place] a phone number for the referral. I do not know whom to refer her to there specifically. Could her oncologist help with this ????

## 2018-09-16 NOTE — Telephone Encounter (Signed)
Referral put in for [facility]

## 2018-09-16 NOTE — Telephone Encounter (Signed)
Sarah [name] called back to say she wants to go here in [place] for a colonoscopy, that is more convenient for her family.

## 2018-09-21 ENCOUNTER — Encounter: Payer: Self-pay | Admitting: Gastroenterology

## 2018-10-25 ENCOUNTER — Encounter: Payer: Self-pay | Admitting: Gastroenterology

## 2018-10-25 ENCOUNTER — Ambulatory Visit (INDEPENDENT_AMBULATORY_CARE_PROVIDER_SITE_OTHER): Payer: Medicare Other | Admitting: Gastroenterology

## 2018-10-25 ENCOUNTER — Other Ambulatory Visit: Payer: Self-pay

## 2018-10-25 VITALS — BP 140/76 | HR 80 | Temp 97.9°F | Ht 65.0 in | Wt 203.4 lb

## 2018-10-25 DIAGNOSIS — R933 Abnormal findings on diagnostic imaging of other parts of digestive tract: Secondary | ICD-10-CM

## 2018-10-25 DIAGNOSIS — K5909 Other constipation: Secondary | ICD-10-CM

## 2018-10-25 MED ORDER — PEG 3350-KCL-NA BICARB-NACL 420 G PO SOLR: 4000.0000 mL | Freq: Once | ORAL | 0 refills | Status: AC

## 2018-10-25 NOTE — Progress Notes (Signed)
[facility] Consult Note:  History: Sarah [name] 10/25/2018  Referring provider: Elby Showers, MD  Reason for consult/chief complaint: Abnormal CT, Constipation, Gastroesophageal Reflux, and Hemorrhoids   Subjective  HPI:  This is a very pleasant 68 year old woman referred by primary care with concerns about an abnormal finding on CT scan of the abdomen.  Sarah [name] has no personal or family history of colon cancer, no previous colon cancer screening. She has been treated by [facility] oncology for metastatic melanoma, and took a chemotherapeutic agent and now is on radiographic surveillance.  CT scan from last month describes a 1.2 cm hyperdensity in the transverse colon (no more specific description of its appearance or location is in the report), and it is reportedly stable to perhaps slightly increased from a scan in April. There is no bowel wall thickening or obstruction. There are some other incidental findings in the chest and abdomen as noted in the report. Full report is on file in care everywhere.  Sarah [name] has had lifelong constipation, and typically has a BM every 3 or 4 days.  If she takes a stimulant laxative it will help somewhat.  She has tried high-fiber diet but that only makes her bloated. Occasional bleeding that she has attributed to hemorrhoids from constipation.  ROS:  Review of Systems  Constitutional: Negative for appetite change and unexpected weight change.  HENT: Negative for mouth sores and voice change.   Eyes: Negative for pain and redness.  Respiratory: Negative for cough and shortness of breath.   Cardiovascular: Negative for chest pain and palpitations.  Genitourinary: Negative for dysuria and hematuria.  Musculoskeletal: Negative for arthralgias and myalgias.  Skin: Negative for pallor and rash.  Neurological: Negative for weakness and headaches.  Hematological: Negative for adenopathy.  Psychiatric/Behavioral:       Depression in the  past, no current medicines for that     Past Medical History: Past Medical History:  Diagnosis Date  . 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[place])   . Adrenal insufficiency ([place])   . Anxiety   . B12 deficiency   . Cancer ([place])    melinoma  . Cervical radiculitis   . Constipation   . Dependent edema   . Depression   . Diabetes mellitus   . Dysmenorrhea   . H/O: suicide attempt   . History of antineoplastic chemotherapy   . History of chemotherapy   . Hyperlipidemia   . Hypertension   . Insomnia   . 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[place])   . 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[place])   . PMS (premenstrual syndrome)      Past Surgical History: Past Surgical History:  Procedure Laterality Date  . ABDOMINAL HYSTERECTOMY    . BREAST BIOPSY Right    benign  . CHOLECYSTECTOMY    . DILATION AND CURETTAGE OF UTERUS    . LUMBAR FUSION    . TUBAL LIGATION       Family History: Family History  Problem Relation Age of Onset  . Diabetes Mother   . Mental illness Mother   . Heart disease Mother   . Lung cancer Father   . Colon cancer Neg Hx   . Liver cancer Neg Hx   . Pancreatic cancer Neg Hx   . Esophageal cancer Neg Hx   . Stomach cancer Neg Hx     Social History: Social History   Socioeconomic History  . Marital status: Widowed    Spouse name: Not on file  . Number of  children: Not on file  . Years of education: Not on file  . Highest education level: Not on file  Occupational History  . Not on file  Social Needs  . Financial resource strain: Not on file  . Food insecurity    Worry: Not on file    Inability: Not on file  . Transportation needs    Medical: Not on file    Non-medical: Not on file  Tobacco Use  . Smoking status: Never Smoker  . Smokeless tobacco: Never Used  Substance and Sexual Activity  . Alcohol use: Not Currently  . Drug use: Never  . Sexual activity: Not on file  Lifestyle  . Physical activity    Days per week: Not on file    Minutes per session:  Not on file  . Stress: Not on file  Relationships  . Social Herbalist on phone: Not on file    Gets together: Not on file    Attends religious service: Not on file    Active member of club or organization: Not on file    Attends meetings of clubs or organizations: Not on file    Relationship status: Not on file  Other Topics Concern  . Not on file  Social History Narrative  . Not on file    Allergies: Allergies  Allergen Reactions  . Doxycycline Rash  . Ciprofloxacin Rash    "very bad rash"  . Penicillins Rash    Has patient had a PCN reaction causing immediate rash, facial/tongue/throat swelling, SOB or lightheadedness with hypotension: No Has patient had a PCN reaction causing severe rash involving mucus membranes or skin necrosis: No Has patient had a PCN reaction that required hospitalization: No Has patient had a PCN reaction occurring within the last 10 years: No If all of the above answers are "NO", then may proceed with Cephalosporin use.     Outpatient Meds: Current Outpatient Medications  Medication Sig Dispense Refill  . acetaminophen (TYLENOL) 500 MG tablet Take 1,000 mg by mouth daily as needed for moderate pain.    . cloNIDine (CATAPRES) 0.1 MG tablet Take 1 tablet (0.1 mg total) by mouth 2 (two) times daily. 180 tablet 2  . ergocalciferol (VITAMIN D2) 50000 units capsule Take 1 weekly for 12 weeks 12 capsule 0  . furosemide (LASIX) 40 MG tablet Take 1 tablet (40 mg total) by mouth daily. 30 tablet 3  . hydrocortisone (CORTEF) 10 MG tablet Take 20 mg by mouth 3 (three) times daily.  3  . losartan (COZAAR) 50 MG tablet TAKE 1 TABLET BY MOUTH DAILY 90 tablet 1  . simvastatin (ZOCOR) 10 MG tablet Take 1 tablet (10 mg total) by mouth at bedtime. 90 tablet 3  . triamcinolone ointment (KENALOG) 0.5 % Apply 1 application topically 2 (two) times daily.  1  . zolpidem (AMBIEN) 10 MG tablet Take 0.5 mg by mouth at bedtime. Alternate 1/2 a tab QHS    .  polyethylene glycol-electrolytes (NULYTELY/GOLYTELY) 420 g solution Take 4,000 mLs by mouth once for 1 dose. 4000 mL 0   No current facility-administered medications for this visit.       ___________________________________________________________________ Objective   Exam:  BP 140/76   Pulse 80   Temp 97.9 F (36.6 C)   Ht 5\' 5"  (1.651 m)   Wt 203 lb 6.4 oz (92.3 kg)   BMI 33.85 kg/m    General: Well-appearing, pleasant and conversational  Eyes: sclera anicteric, no redness  ENT: oral mucosa moist without lesions, no cervical or supraclavicular lymphadenopathy  CV: RRR without murmur, S1/S2, no JVD, no peripheral edema  Resp: clear to auscultation bilaterally, normal RR and effort noted  GI: soft, no tenderness, with active bowel sounds. No guarding or palpable organomegaly noted.  Skin; warm and dry, no rash or jaundice noted  Neuro: awake, alert and oriented x 3. Normal gross motor function and fluent speech  Labs:  CBC Latest Ref Rng & Units 01/10/2018 10/04/2017 09/06/2017  WBC 3.8 - 10.8 Thousand/uL 12.6(H) 12.2(H) 3.9(L)  Hemoglobin 11.7 - 15.5 g/dL 13.1 14.5 11.5(L)  Hematocrit 35.0 - 45.0 % 38.1 42.8 33.7(L)  Platelets 140 - 400 Thousand/uL 304 246 233   CT chest and abdomen as noted above.  Assessment: Encounter Diagnoses  Name Primary?  . Abnormal finding on GI tract imaging Yes  . Chronic constipation     Reported abnormality of the transverse colon on CT scan, difficult to characterize.  Longstanding constipation, no current therapy.  Plan:  Recommended daily MiraLAX, both for relief of constipation and for better bowel preparation for upcoming procedure.  Colonoscopy to evaluate CT scan finding.  She is agreeable after discussion of procedure and risks.  The benefits and risks of the planned procedure were described in detail with the patient or (when appropriate) their health care proxy.  Risks were outlined as including, but not limited to,  bleeding, infection, perforation, adverse medication reaction leading to cardiac or pulmonary decompensation, pancreatitis (if ERCP).  The limitation of incomplete mucosal visualization was also discussed.  No guarantees or warranties were given.  4 L split dose PEG prep due to constipation.  Thank you for the courtesy of this consult.  Please call me with any questions or concerns.  Nelida Meuse III  CC: Referring provider noted above

## 2018-10-25 NOTE — Patient Instructions (Addendum)
If you are age 67 or older, your body mass index should be between 23-30. Your Body mass index is 33.85 kg/m. If this is out of the aforementioned range listed, please consider follow up with your Primary Care Provider.  If you are age 29 or younger, your body mass index should be between 19-25. Your Body mass index is 33.85 kg/m. If this is out of the aformentioned range listed, please consider follow up with your Primary Care Provider.   You have been scheduled for a colonoscopy. Please follow written instructions given to you at your visit today.  Please pick up your prep supplies at the pharmacy within the next 1-3 days. If you use inhalers (even only as needed), please bring them with you on the day of your procedure. Your physician has requested that you go to www.startemmi.com and enter the access code given to you at your visit today. This web site gives a general overview about your procedure. However, you should still follow specific instructions given to you by our office regarding your preparation for the procedure.  Please start 1 capful of Miralax daily.   It was a pleasure to see you today!  Dr. Loletha Carrow

## 2018-11-02 ENCOUNTER — Other Ambulatory Visit: Payer: Self-pay

## 2018-11-02 DIAGNOSIS — Z1159 Encounter for screening for other viral diseases: Secondary | ICD-10-CM

## 2018-11-03 ENCOUNTER — Encounter: Payer: Self-pay | Admitting: Gastroenterology

## 2018-11-04 LAB — SARS CORONAVIRUS 2 (TAT 6-24 HRS): SARS Coronavirus 2: NEGATIVE

## 2018-11-06 ENCOUNTER — Other Ambulatory Visit: Payer: Self-pay | Admitting: Internal Medicine

## 2018-11-07 ENCOUNTER — Telehealth: Payer: Self-pay

## 2018-11-07 NOTE — Telephone Encounter (Signed)
Covid-19 screening questions   Do you now or have you had a fever in the last 14 days?  Do you have any respiratory symptoms of shortness of breath or cough now or in the last 14 days?  Do you have any family members or close contacts with diagnosed or suspected Covid-19 in the past 14 days?  Have you been tested for Covid-19 and found to be positive?       

## 2018-11-07 NOTE — Telephone Encounter (Signed)
Pt answered "NO" to all Covid Questions °

## 2018-11-08 ENCOUNTER — Encounter: Payer: Self-pay | Admitting: Gastroenterology

## 2018-11-08 ENCOUNTER — Ambulatory Visit (AMBULATORY_SURGERY_CENTER): Payer: Medicare Other | Admitting: Gastroenterology

## 2018-11-08 VITALS — BP 144/88 | HR 63 | Temp 98.4°F | Resp 16 | Ht 65.0 in | Wt 203.0 lb

## 2018-11-08 DIAGNOSIS — D122 Benign neoplasm of ascending colon: Secondary | ICD-10-CM

## 2018-11-08 DIAGNOSIS — D120 Benign neoplasm of cecum: Secondary | ICD-10-CM

## 2018-11-08 DIAGNOSIS — D125 Benign neoplasm of sigmoid colon: Secondary | ICD-10-CM

## 2018-11-08 DIAGNOSIS — D123 Benign neoplasm of transverse colon: Secondary | ICD-10-CM

## 2018-11-08 DIAGNOSIS — D124 Benign neoplasm of descending colon: Secondary | ICD-10-CM

## 2018-11-08 DIAGNOSIS — R933 Abnormal findings on diagnostic imaging of other parts of digestive tract: Secondary | ICD-10-CM | POA: Diagnosis not present

## 2018-11-08 HISTORY — PX: COLONOSCOPY: SHX174

## 2018-11-08 MED ORDER — SODIUM CHLORIDE 0.9 % IV SOLN: 500.0000 mL | Freq: Once | INTRAVENOUS | Status: DC

## 2018-11-08 NOTE — Progress Notes (Signed)
Pt had to make position changes to aid help passing flatus.  No other problems noted in the recovery room. maw

## 2018-11-08 NOTE — Progress Notes (Signed)
A/ox3, pleased with MAC, report to RN 

## 2018-11-08 NOTE — Patient Instructions (Addendum)
YOU HAD AN ENDOSCOPIC PROCEDURE TODAY AT [facility]:   Refer to the procedure report that was given to you for any specific questions about what was found during the examination.  If the procedure report does not answer your questions, please call your gastroenterologist to clarify.  If you requested that your care partner not be given the details of your procedure findings, then the procedure report has been included in a sealed envelope for you to review at your convenience later.  YOU SHOULD EXPECT: Some feelings of bloating in the abdomen. Passage of more gas than usual.  Walking can help get rid of the air that was put into your GI tract during the procedure and reduce the bloating. If you had a lower endoscopy (such as a colonoscopy or flexible sigmoidoscopy) you may notice spotting of blood in your stool or on the toilet paper. If you underwent a bowel prep for your procedure, you may not have a normal bowel movement for a few days.  Please Note:  You might notice some irritation and congestion in your nose or some drainage.  This is from the oxygen used during your procedure.  There is no need for concern and it should clear up in a day or so.  SYMPTOMS TO REPORT IMMEDIATELY:   Following lower endoscopy (colonoscopy or flexible sigmoidoscopy):  Excessive amounts of blood in the stool  Significant tenderness or worsening of abdominal pains  Swelling of the abdomen that is new, acute  Fever of 100F or higher   For urgent or emergent issues, a gastroenterologist can be reached at any hour by calling [phone].   DIET:  We do recommend a small meal at first, but then you may proceed to your regular diet.  Drink plenty of fluids but you should avoid alcoholic beverages for 24 hours.  ACTIVITY:  You should plan to take it easy for the rest of today and you should NOT DRIVE or use heavy machinery until tomorrow (because of the sedation medicines used during the test).     FOLLOW UP: Our staff will call the number listed on your records 48-72 hours following your procedure to check on you and address any questions or concerns that you may have regarding the information given to you following your procedure. If we do not reach you, we will leave a message.  We will attempt to reach you two times.  During this call, we will ask if you have developed any symptoms of COVID 19. If you develop any symptoms (ie: fever, flu-like symptoms, shortness of breath, cough etc.) before then, please call [phone].  If you test positive for Covid 19 in the 2 weeks post procedure, please call and report this information to [place].    If any biopsies were taken you will be contacted by phone or by letter within the next 1-3 weeks.  Please call us at [phone] if you have not heard about the biopsies in 3 weeks.    SIGNATURES/CONFIDENTIALITY: You and/or your care partner have signed paperwork which will be entered into your electronic medical record.  These signatures attest to the fact that that the information above on your After Visit Summary has been reviewed and is understood.  Full responsibility of the confidentiality of this discharge information lies with you and/or your care-partner.    Handouts were given to you on polyps and diverticulosis. NO ASPIRIN, ASPIRIN CONTAINING PRODUCTS (BC OR GOODY POWDERS) OR NSAIDS (IBUPROFEN, ADVIL, ALEVE, AND  MOTRIN) FOR 7 days; TYLENOL IS OK TO TAKE You may resume your other current medications today. Await biopsy results. Please call if any questions or concerns.

## 2018-11-08 NOTE — Progress Notes (Signed)
Temp check by JB. Vital check by [place].  Medical and surgical history reviewed and updated with patient.

## 2018-11-08 NOTE — Op Note (Signed)
[facility] Patient Name: Sarah [name] Procedure Date: 11/08/2018 3:14 PM MRN: [id number] Endoscopist: Mallie Mussel L. Loletha Carrow , MD Age: 68 Referring MD:  Date of Birth: 1950-11-28 Gender: Female Account #: [id number] Procedure:                Colonoscopy Indications:              Abnormal CT of the GI tract (surveillance CT scan                            reported 1.2 cm abnormality in transverse colon) Medicines:                Monitored Anesthesia Care Procedure:                Pre-Anesthesia Assessment:                           - Prior to the procedure, a History and Physical                            was performed, and patient medications and                            allergies were reviewed. The patient's tolerance of                            previous anesthesia was also reviewed. The risks                            and benefits of the procedure and the sedation                            options and risks were discussed with the patient.                            All questions were answered, and informed consent                            was obtained. Prior Anticoagulants: The patient has                            taken no previous anticoagulant or antiplatelet                            agents. ASA Grade Assessment: II - A patient with                            mild systemic disease. After reviewing the risks                            and benefits, the patient was deemed in                            satisfactory condition to undergo the procedure.  After obtaining informed consent, the colonoscope                            was passed under direct vision. Throughout the                            procedure, the patient's blood pressure, pulse, and                            oxygen saturations were monitored continuously. The                            Colonoscope was introduced through the anus and                            advanced to  the the cecum, identified by                            appendiceal orifice and ileocecal valve. The                            colonoscopy was somewhat difficult due to                            significant looping. Successful completion of the                            procedure was aided by using manual pressure. The                            patient tolerated the procedure well. The quality                            of the bowel preparation was good. The ileocecal                            valve, appendiceal orifice, and rectum were                            photographed. Scope In: 3:19:39 PM Scope Out: 4:15:49 PM Scope Withdrawal Time: 0 hours 50 minutes 55 seconds  Total Procedure Duration: 0 hours 56 minutes 10 seconds  Findings:                 The perianal and digital rectal examinations were                            normal.                           Three sessile polyps were found in the proximal                            ascending colon and cecum. The polyps were  diminutive in size. These polyps were removed with                            a cold snare. Resection and retrieval were                            complete. (Jar 1)                           Four sessile polyps were found in the proximal                            ascending colon. The polyps were 4 to 6 mm in size.                            These polyps were removed with a cold snare.                            Resection and retrieval were complete. (Jar 2)                           Three sessile polyps were found in the proximal                            transverse colon and ascending colon. The polyps                            were 4 to 6 mm in size. These polyps were removed                            with a cold snare. Resection and retrieval were                            complete. (Jar 3)                           Two semi-pedunculated polyps were found in the mid                             transverse colon. The polyps were 6 to 8 mm in                            size. These polyps were removed with a hot snare.                            Resection and retrieval were complete. (Jar 4)                           A 12 mm polyp was found in the mid transverse                            colon. The polyp was pedunculated. The polyp was  removed with a hot snare. Resection and retrieval                            were complete. This is likely the lesion seen on CT                            scan. (Jar 5)                           Four sessile polyps were found in the proximal                            descending colon. The polyps were 6 to 8 mm in                            size. These polyps were removed with both a hot and                            cold snare. Resection and retrieval were complete.                            (Jar 6)                           A 6 mm polyp was found in the descending colon. The                            polyp was pedunculated. The polyp was removed with                            a hot snare. Resection and retrieval were complete.                           Three sessile polyps were found in the sigmoid                            colon and descending colon. The polyps were 4 to 6                            mm in size. These polyps were removed with a cold                            snare. Resection and retrieval were complete.                           Multiple small-mouthed diverticula were found in                            the sigmoid colon. The sigmoid colon was also                            redundant, causing scope looping.  The exam was otherwise without abnormality on                            direct and retroflexion views. Complications:            No immediate complications. Estimated Blood Loss:     Estimated blood loss was minimal. Impression:               -  Three diminutive polyps in the proximal ascending                            colon and in the cecum, removed with a cold snare.                            Resected and retrieved.                           - Four 4 to 6 mm polyps in the proximal ascending                            colon, removed with a cold snare. Resected and                            retrieved.                           - Three 4 to 6 mm polyps in the proximal transverse                            colon and in the ascending colon, removed with a                            cold snare. Resected and retrieved.                           - Two 6 to 8 mm polyps in the mid transverse colon,                            removed with a hot snare. Resected and retrieved.                           - One 12 mm polyp in the mid transverse colon,                            removed with a hot snare. Resected and retrieved.                           - Four 6 to 8 mm polyps in the proximal descending                            colon, removed with a hot snare. Resected and  retrieved.                           - One 6 mm polyp in the descending colon, removed                            with a hot snare. Resected and retrieved.                           - Three 4 to 6 mm polyps in the sigmoid colon and                            in the descending colon, removed with a cold snare.                            Resected and retrieved.                           - Diverticulosis in the sigmoid colon.                           - The examination was otherwise normal on direct                            and retroflexion views. Recommendation:           - Patient has a contact number available for                            emergencies. The signs and symptoms of potential                            delayed complications were discussed with the                            patient. Return to normal activities tomorrow.                             Written discharge instructions were provided to the                            patient.                           - Resume previous diet.                           - Continue present medications.                           - No aspirin, ibuprofen, naproxen, or other                            non-steroidal anti-inflammatory drugs for 7 days  after polyp removal.                           - Await pathology results.                           - Repeat colonoscopy is recommended for                            surveillance. The colonoscopy date will be                            determined after pathology results from today's                            exam become available for review. Jourden Delmont L. Loletha Carrow, MD 11/08/2018 4:29:09 PM This report has been signed electronically.

## 2018-11-10 ENCOUNTER — Telehealth: Payer: Self-pay

## 2018-11-10 NOTE — Telephone Encounter (Signed)
  Follow up Call-  Call back number 11/08/2018  Post procedure Call Back phone  # [phone]  Permission to leave phone message Yes  Some recent data might be hidden     Patient questions:  Do you have a fever, pain , or abdominal swelling? No. Pain Score  0 *  Have you tolerated food without any problems? Yes.    Have you been able to return to your normal activities? Yes.    Do you have any questions about your discharge instructions: Diet   No. Medications  No. Follow up visit  No.  Do you have questions or concerns about your Care? No.  Actions: * If pain score is 4 or above: No action needed, pain <4.  1. Have you developed a fever since your procedure? no  2.   Have you had an respiratory symptoms (SOB or cough) since your procedure? no  3.   Have you tested positive for COVID 19 since your procedure no  4.   Have you had any family members/close contacts diagnosed with the COVID 19 since your procedure?  no   If yes to any of these questions please route to Joylene John, RN and Alphonsa Gin, Therapist, sports.

## 2018-11-11 ENCOUNTER — Encounter: Payer: Self-pay | Admitting: Gastroenterology

## 2019-01-16 ENCOUNTER — Ambulatory Visit (INDEPENDENT_AMBULATORY_CARE_PROVIDER_SITE_OTHER): Payer: Medicare Other | Admitting: Internal Medicine

## 2019-01-16 ENCOUNTER — Other Ambulatory Visit: Payer: Self-pay

## 2019-01-16 ENCOUNTER — Encounter: Payer: Self-pay | Admitting: Internal Medicine

## 2019-01-16 VITALS — BP 130/80 | HR 73 | Temp 98.3°F | Ht 65.0 in | Wt 200.0 lb

## 2019-01-16 DIAGNOSIS — E271 Primary adrenocortical insufficiency: Secondary | ICD-10-CM | POA: Diagnosis not present

## 2019-01-16 DIAGNOSIS — I10 Essential (primary) hypertension: Secondary | ICD-10-CM | POA: Diagnosis not present

## 2019-01-16 DIAGNOSIS — E8881 Metabolic syndrome: Secondary | ICD-10-CM

## 2019-01-16 DIAGNOSIS — F3176 Bipolar disorder, in full remission, most recent episode depressed: Secondary | ICD-10-CM | POA: Diagnosis not present

## 2019-01-16 DIAGNOSIS — R7302 Impaired glucose tolerance (oral): Secondary | ICD-10-CM | POA: Diagnosis not present

## 2019-01-16 DIAGNOSIS — E876 Hypokalemia: Secondary | ICD-10-CM

## 2019-01-16 DIAGNOSIS — Z23 Encounter for immunization: Secondary | ICD-10-CM | POA: Diagnosis not present

## 2019-01-16 DIAGNOSIS — C4371 Malignant melanoma of right lower limb, including hip: Secondary | ICD-10-CM

## 2019-01-16 DIAGNOSIS — R8290 Unspecified abnormal findings in urine: Secondary | ICD-10-CM

## 2019-01-16 DIAGNOSIS — E785 Hyperlipidemia, unspecified: Secondary | ICD-10-CM

## 2019-01-16 DIAGNOSIS — R609 Edema, unspecified: Secondary | ICD-10-CM

## 2019-01-16 DIAGNOSIS — Z6833 Body mass index (BMI) 33.0-33.9, adult: Secondary | ICD-10-CM

## 2019-01-16 DIAGNOSIS — Z0000 Encounter for general adult medical examination without abnormal findings: Secondary | ICD-10-CM

## 2019-01-16 DIAGNOSIS — Z8639 Personal history of other endocrine, nutritional and metabolic disease: Secondary | ICD-10-CM

## 2019-01-16 LAB — POCT URINALYSIS DIPSTICK
Bilirubin, UA: NEGATIVE
Blood, UA: NEGATIVE
Glucose, UA: NEGATIVE
Ketones, UA: NEGATIVE
Nitrite, UA: NEGATIVE
Protein, UA: NEGATIVE
Spec Grav, UA: 1.015 (ref 1.010–1.025)
Urobilinogen, UA: 0.2 E.U./dL
pH, UA: 6.5 (ref 5.0–8.0)

## 2019-01-16 NOTE — Patient Instructions (Addendum)
Labs drawn and pending with further instructions to follow.  Please try the diet exercise and lose weight.  Continue current medications.  Stay safe during the pandemic and continue to social distance until vaccine is available.  Follow-up in 6 months.  Flu vaccine given today

## 2019-01-16 NOTE — Progress Notes (Signed)
Subjective:    Patient ID: Sarah [name], female    DOB: Nov 25, 1950, 68 y.o.   MRN: [id number]  HPI  68 year old Female seen for health maintenance exam and evaluation of medical issues.   Had numerous polyps removed on colonoscopy in October by Dr. Loletha Carrow. Report reviewed. Follow up colonoscopy in one year.  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  Breslow 2.8 mm with ulceration.  Treated at [facility] with wide local excision, took 10 treatments of Nivolumab.  There has been no evidence of metastatic disease in chest abdomen or pelvis.  She had right inguinal node positive for melanoma x2.  Has close surveillance at [facility].  See psychiatrist at [facility] for depression.  History of left shoulder arthropathy treated fall 02/07/2017 with cortisone injection.  History of peripheral neuropathy.   Has had steroid neck injections at removal clinic with history of cervical disc disease has been tried on Lyrica and gabapentin by physiatrist Dr. Everett [facility] around 2018.  Had knee injections at [facility]  in 2018  History of adrenal insufficiency followed at [facility].  History of impaired glucose tolerance.  Hemoglobin A1c in June was 6.2%  Urine specimen today is cloudy.  Has moderate LE and culture will be sent.  Total cholesterol was 234, triglycerides 181 and LDL cholesterol 146.  Has not been exercising.  She is on statin therapy consisting of Zocor 10 mg daily.  May need to increase this.  Bone density study and mammogram ordered.  History of dependent edema and hypokalemia secondary to diuretic therapy.  History of hypertension.  Has had multiple suicide attempts by overdose.  Last attempt was August 2012.  Hospitalized April 2011 with drug overdose including clonazepam, Ambien, gabapentin.  Has been depressed since her husband died of complications of pancreatic cancer in 2002.  In July 2003, she overdosed on Ambien,  Restoril, Seroquel.  In 2004 she was hospitalized for 3 days at [facility] behavioral health.  History of bipolar disorder and needs to be followed by Dr. Letta Moynahan is retired from [employer].  Now being seen at [facility].  History of orthostasis September 2019 with no further episodes.  In 2019 she did gain 13 pounds and now has gained an additional 15 pounds.  She is allergic to penicillin.  She does not smoke or consume alcohol.  Several years ago she was hospitalized for chest pain at [facility] where MI was ruled out.  Bilateral tubal ligation 1982, D&C 1989, laparoscopic cholecystectomy 1994, lumbar fusion 2003, hysterectomy with BSO 2005, fractured left elbow 2006.  She did have lumbar fusion in 2003 for chronic back pain by Dr. Ellene Route and that did seem to help chronic back pain.  She slipped on some steps at work in 2000 and began to have severe back problems which subsequently resulted in her obtaining disability benefits.  She was under the care of Dr. Nicholaus Bloom for chronic pain management for a long time.  Social history: She has 1 adult daughter and 1 grandson.  Daughter has had some health problems.  Patient remarried after her husband died but second marriage did not work out.  She is now divorced.  Formerly worked for [employer]  Family history: Mother resided in [place] and died about 3 years ago.  Mother had history of bipolar disorder and dementia with history of CABG.  Father died with lung cancer but was a non-smoker.  1 brother with history of lupus.  1 sister with history of COPD.      Review of Systems  Constitutional: Negative.   Respiratory: Negative.   Cardiovascular: Negative.   Gastrointestinal: Negative.   Genitourinary:       Abnormal urine specimen culture sent  Neurological: Negative.    no new complaints-realizes she has gained weight     Objective:   Physical Exam Blood pressure 130/80, pulse  73, temperature 98.3 pulse oximetry 98% weight 200 pounds.  Increase of 15 pounds since December 2019.  BMI 33.28 Skin warm and dry.  Nodes none.  TMs are clear.  Neck is supple without JVD thyromegaly or carotid bruits.  Chest clear.  Breast without masses.  Cardiac exam regular rate and rhythm.  Abdomen no hepatosplenomegaly masses or tenderness.  No pitting edema of the lower extremities.  Neuro no gross focal deficits on brief neurological exam.  Affect is within normal limits.      Assessment & Plan:  History of stage III melanoma followed at [facility]  History of depression longstanding and followed at [facility]  History of adrenal insufficiency followed at [facility]  Peripheral neuropathy-likely related to chemotherapy  Essential hypertension-stable on current regimen  History of dependent edema-stable and not present today  History of hypokalemia secondary to diuretic therapy  Controlled type 2 diabetes mellitus-continue diet exercise and weight loss  Weight gain-has gained 15 pounds since last year and last year gained 13 pounds.  Family history of coronary artery disease in mother  History of left shoulder arthropathy  History of orthostasis-?  Related to adrenal insufficiency.  Prior to that was having issues with syncope and falls but has improved.  Plan: Continue current medications.  Continue follow-up at [facility] noted above and follow-up in 6 months.  Subjective:   Patient presents for Medicare Annual/Subsequent preventive examination.  Review Past Medical/Family/Social: See above   Risk Factors  Current exercise habits: Not much during the pandemic Dietary issues discussed: Low-fat low carbohydrate advised  Cardiac risk factors: Family history in mother, impaired glucose tolerance hyperlipidemia  Depression Screen  (Note: if answer to either of the following is "Yes", a more complete depression screening is indicated)   Over the past two weeks, have you felt down,  depressed or hopeless? No  Over the past two weeks, have you felt little interest or pleasure in doing things? No Have you lost interest or pleasure in daily life? No Do you often feel hopeless? No Do you cry easily over simple problems? No   Activities of Daily Living  In your present state of health, do you have any difficulty performing the following activities?:   Driving? No  Managing money? No  Feeding yourself? No  Getting from bed to chair? No  Climbing a flight of stairs? No  Preparing food and eating?: No  Bathing or showering? No  Getting dressed: No  Getting to the toilet? No  Using the toilet:No  Moving around from place to place: No  In the past year have you fallen or had a near fall?:  Yes  are you sexually active? No  Do you have more than one partner? No   Hearing Difficulties: No  Do you often ask people to speak up or repeat themselves? No  Do you experience ringing or noises in your ears? No  Do you have difficulty understanding soft or whispered voices?  Yes Do you feel that you have a problem with memory? No Do you often  misplace items? No    Home Safety:  Do you have a smoke alarm at your residence? Yes Do you have grab bars in the bathroom?  No Do you have throw rugs in your house?  Yes   Cognitive Testing  Alert? Yes Normal Appearance?Yes  Oriented to person? Yes Place? Yes  Time? Yes  Recall of three objects? Yes  Can perform simple calculations? Yes  Displays appropriate judgment?Yes  Can read the correct time from a watch face?Yes   List the Names of Other Physician/Practitioners you currently use:  See referral list for the physicians patient is currently seeing.  See care everywhere notes from [facility]: See above   Objective:     General appearance: Appears stated age and  obese  Head: Normocephalic, without obvious abnormality, atraumatic  Eyes: conj clear, EOMi PEERLA  Ears: normal TM's and external ear  canals both ears  Nose: Nares normal. Septum midline. Mucosa normal. No drainage or sinus tenderness.  Throat: lips, mucosa, and tongue normal; teeth and gums normal  Neck: no adenopathy, no carotid bruit, no JVD, supple, symmetrical, trachea midline and thyroid not enlarged, symmetric, no tenderness/mass/nodules  No CVA tenderness.  Lungs: clear to auscultation bilaterally  Breasts: normal appearance, no masses or tenderness Heart: regular rate and rhythm, S1, S2 normal, no murmur, click, rub or gallop  Abdomen: soft, non-tender; bowel sounds normal; no masses, no organomegaly  Musculoskeletal: ROM normal in all joints, no crepitus, no deformity, Normal muscle strengthen. Back  is symmetric, no curvature. Skin: Skin color, texture, turgor normal. No rashes or lesions  Lymph nodes: Cervical, supraclavicular, and axillary nodes normal.  Neurologic: CN 2 -12 Normal, Normal symmetric reflexes. Normal coordination and gait  Psych: Alert & Oriented x 3, Mood appear stable.    Assessment:    Annual wellness medicare exam   Plan:    During the course of the visit the patient was educated and counseled about appropriate screening and preventive services including:  Flu vaccine up-to-date  Has not had Shingrix vaccine and should defer that until after the pandemic  Mammogram and bone density study ordered      Patient Instructions (the written plan) was given to the patient.  Medicare Attestation  I have personally reviewed:  The patient's medical and social history  Their use of alcohol, tobacco or illicit drugs  Their current medications and supplements  The patient's functional ability including ADLs,fall risks, home safety risks, cognitive, and hearing and visual impairment  Diet and physical activities  Evidence for depression or mood disorders  The patient's weight, height, BMI, and visual acuity have been recorded in the chart. I have made referrals, counseling, and provided  education to the patient based on review of the above and I have provided the patient with a written personalized care plan for preventive services.

## 2019-01-17 LAB — COMPLETE METABOLIC PANEL WITH GFR
AG Ratio: 2.2 (calc) (ref 1.0–2.5)
ALT: 24 U/L (ref 6–29)
AST: 16 U/L (ref 10–35)
Albumin: 4.3 g/dL (ref 3.6–5.1)
Alkaline phosphatase (APISO): 99 U/L (ref 37–153)
BUN: 14 mg/dL (ref 7–25)
CO2: 28 mmol/L (ref 20–32)
Calcium: 9.2 mg/dL (ref 8.6–10.4)
Chloride: 102 mmol/L (ref 98–110)
Creat: 0.91 mg/dL (ref 0.50–0.99)
GFR, Est African American: 76 mL/min/{1.73_m2} (ref >=60)
GFR, Est Non African American: 65 mL/min/{1.73_m2} (ref >=60)
Globulin: 2 g/dL (calc) (ref 1.9–3.7)
Glucose, Bld: 127 mg/dL — ABNORMAL HIGH (ref 65–99)
Potassium: 4 mmol/L (ref 3.5–5.3)
Sodium: 142 mmol/L (ref 135–146)
Total Bilirubin: 0.5 mg/dL (ref 0.2–1.2)
Total Protein: 6.3 g/dL (ref 6.1–8.1)

## 2019-01-17 LAB — CBC WITH DIFFERENTIAL/PLATELET
Absolute Monocytes: 605 cells/uL (ref 200–950)
Basophils Absolute: 80 cells/uL (ref 0–200)
Basophils Relative: 0.9 %
Eosinophils Absolute: 240 cells/uL (ref 15–500)
Eosinophils Relative: 2.7 %
HCT: 43.9 % (ref 35.0–45.0)
Hemoglobin: 14.9 g/dL (ref 11.7–15.5)
Lymphs Abs: 2359 cells/uL (ref 850–3900)
MCH: 29.3 pg (ref 27.0–33.0)
MCHC: 33.9 g/dL (ref 32.0–36.0)
MCV: 86.2 fL (ref 80.0–100.0)
MPV: 10.7 fL (ref 7.5–12.5)
Monocytes Relative: 6.8 %
Neutro Abs: 5616 cells/uL (ref 1500–7800)
Neutrophils Relative %: 63.1 %
Platelets: 277 10*3/uL (ref 140–400)
RBC: 5.09 10*6/uL (ref 3.80–5.10)
RDW: 12 % (ref 11.0–15.0)
Total Lymphocyte: 26.5 %
WBC: 8.9 10*3/uL (ref 3.8–10.8)

## 2019-01-17 LAB — LIPID PANEL
Cholesterol: 234 mg/dL — ABNORMAL HIGH (ref <200)
HDL: 56 mg/dL (ref >=50)
LDL Cholesterol (Calc): 146 mg/dL (calc) — ABNORMAL HIGH
Non-HDL Cholesterol (Calc): 178 mg/dL (calc) — ABNORMAL HIGH (ref <130)
Total CHOL/HDL Ratio: 4.2 (calc) (ref <5.0)
Triglycerides: 181 mg/dL — ABNORMAL HIGH (ref <150)

## 2019-01-17 LAB — TSH: TSH: 2.17 mIU/L (ref 0.40–4.50)

## 2019-01-17 LAB — HEMOGLOBIN A1C
Hgb A1c MFr Bld: 6.4 % of total Hgb — ABNORMAL HIGH (ref <5.7)
Mean Plasma Glucose: 137 (calc)
eAG (mmol/L): 7.6 (calc)

## 2019-01-18 LAB — URINE CULTURE
MICRO NUMBER:: 1232821
SPECIMEN QUALITY:: ADEQUATE

## 2019-02-04 ENCOUNTER — Other Ambulatory Visit: Payer: Self-pay | Admitting: Internal Medicine

## 2019-03-19 ENCOUNTER — Other Ambulatory Visit: Payer: Self-pay | Admitting: Internal Medicine

## 2019-04-14 ENCOUNTER — Ambulatory Visit: Payer: Medicare Other | Attending: Internal Medicine

## 2019-04-14 DIAGNOSIS — Z23 Encounter for immunization: Secondary | ICD-10-CM

## 2019-04-14 NOTE — Progress Notes (Signed)
   Covid-19 Vaccination Clinic  Name:  Sarah [name]    MRN: [id number] DOB: 1950/10/27  04/14/2019  Sarah [name] was observed post Covid-19 immunization for 15 minutes without incident. She was provided with Vaccine Information Sheet and instruction to access the V-Safe system.   Sarah [name] was instructed to call 911 with any severe reactions post vaccine: Marland Kitchen Difficulty breathing  . Swelling of face and throat  . A fast heartbeat  . A bad rash all over body  . Dizziness and weakness   Immunizations Administered    Name Date Dose VIS Date Route   Pfizer COVID-19 Vaccine 04/14/2019 12:51 PM 0.3 mL 12/30/2018 Intramuscular   Manufacturer: [place]   Lot: [id number]   [place]: [id number]

## 2019-05-02 ENCOUNTER — Other Ambulatory Visit: Payer: Self-pay | Admitting: Internal Medicine

## 2019-05-02 DIAGNOSIS — Z1231 Encounter for screening mammogram for malignant neoplasm of breast: Secondary | ICD-10-CM

## 2019-05-09 ENCOUNTER — Ambulatory Visit: Payer: Medicare Other | Attending: Internal Medicine

## 2019-05-09 DIAGNOSIS — Z23 Encounter for immunization: Secondary | ICD-10-CM

## 2019-05-09 NOTE — Progress Notes (Signed)
   Covid-19 Vaccination Clinic  Name:  Sarah [name]    MRN: [id number] DOB: July 19, 1950  05/09/2019  [name] was observed post Covid-19 immunization for 15 minutes without incident. She was provided with Vaccine Information Sheet and instruction to access the V-Safe system.   [name] was instructed to call 911 with any severe reactions post vaccine: Marland Kitchen Difficulty breathing  . Swelling of face and throat  . A fast heartbeat  . A bad rash all over body  . Dizziness and weakness   Immunizations Administered    Name Date Dose VIS Date Route   Pfizer COVID-19 Vaccine 05/09/2019  1:25 PM 0.3 mL 03/15/2018 Intramuscular   Manufacturer: [place]   Lot: [id number]   [place]: [id number]

## 2019-06-30 ENCOUNTER — Other Ambulatory Visit: Payer: Self-pay | Admitting: Internal Medicine

## 2019-07-18 ENCOUNTER — Ambulatory Visit: Payer: Medicare Other | Admitting: Internal Medicine

## 2019-07-31 ENCOUNTER — Other Ambulatory Visit: Payer: Self-pay

## 2019-07-31 ENCOUNTER — Ambulatory Visit (INDEPENDENT_AMBULATORY_CARE_PROVIDER_SITE_OTHER): Payer: Medicare Other | Admitting: Internal Medicine

## 2019-07-31 ENCOUNTER — Encounter: Payer: Self-pay | Admitting: Internal Medicine

## 2019-07-31 VITALS — BP 120/70 | HR 84 | Ht 65.0 in | Wt 194.0 lb

## 2019-07-31 DIAGNOSIS — I10 Essential (primary) hypertension: Secondary | ICD-10-CM

## 2019-07-31 DIAGNOSIS — H8110 Benign paroxysmal vertigo, unspecified ear: Secondary | ICD-10-CM

## 2019-07-31 DIAGNOSIS — Z8639 Personal history of other endocrine, nutritional and metabolic disease: Secondary | ICD-10-CM

## 2019-07-31 DIAGNOSIS — R42 Dizziness and giddiness: Secondary | ICD-10-CM | POA: Diagnosis not present

## 2019-07-31 DIAGNOSIS — C4371 Malignant melanoma of right lower limb, including hip: Secondary | ICD-10-CM

## 2019-07-31 DIAGNOSIS — E8881 Metabolic syndrome: Secondary | ICD-10-CM | POA: Diagnosis not present

## 2019-07-31 DIAGNOSIS — R7302 Impaired glucose tolerance (oral): Secondary | ICD-10-CM | POA: Diagnosis not present

## 2019-07-31 DIAGNOSIS — F3176 Bipolar disorder, in full remission, most recent episode depressed: Secondary | ICD-10-CM

## 2019-07-31 DIAGNOSIS — E782 Mixed hyperlipidemia: Secondary | ICD-10-CM

## 2019-07-31 DIAGNOSIS — R609 Edema, unspecified: Secondary | ICD-10-CM

## 2019-07-31 DIAGNOSIS — Z6832 Body mass index (BMI) 32.0-32.9, adult: Secondary | ICD-10-CM

## 2019-07-31 MED ORDER — MECLIZINE HCL 25 MG PO TABS: 25.0000 mg | ORAL_TABLET | Freq: Three times a day (TID) | ORAL | 0 refills | Status: DC | PRN

## 2019-07-31 NOTE — Progress Notes (Signed)
   Subjective:    Patient ID: Sarah [name], female    DOB: [birth year], 69 y.o.   MRN: [id number]  HPI 69 year old Female for 6 month follow up.  She has a history of stage III melanoma right foot( Breslow 2.8 mm).  Was treated at [facility] with wide local excision and took 10 treatments of Nivolumab.   She had 2 positive inguinal lymph nodes.  Has close surveillance at [facility].  See psychiatrist at [facility] for depression.  History of bipolar disorder.  History of cervical disc disease and has been seen at [facility] currently being treated with Lyrica.  History of adrenal insufficiency followed at [facility].  She is on hydrocortisone 20 mg 3 times a day.  History of impaired glucose tolerance.  History of hyperlipidemia treated with Zocor 10 mg daily.  Longstanding history of hypertension.  Currently on clonidine 0.1 mg twice daily and Lasix 40 mg daily.  She is also on losartan 50 mg daily.   Recent lipid panel shows total cholesterol 208, HDL 48 triglycerides 204 and LDL cholesterol 126.  Asked patient to watch diet and will reassess in 6 months.  Fasting glucose is 130.  BUN and creatinine are normal.  Hemoglobin A1c 6.4%.  Currently not on medication for diabetes.  It is diet-controlled.  Seems a little down and depressed today.   Review of Systems reports dizziness daily since last Thursday.     Objective:   Physical Exam Orthostatic blood pressures taken blood pressure lying is 110/80, sitting 120/80, standing 98/70.  BMI 32.28 Skin warm and dry.  No cervical adenopathy.  No thyromegaly.  No carotid bruits.  Chest is clear to auscultation.  Cardiac exam regular rate and rhythm normal S1 and S2 without murmurs or gallops.  No lower extremity pitting edema.  Cranial nerves II through XII are grossly intact.  She does have rightward nystagmus with extraocular movement testing.  PERRLA.  Muscle strength is normal in upper and lower extremities.      Assessment & Plan:  My feeling is that  she has acute positional vertigo.  Have prescribed meclizine 25 mg 3 times a day as needed for dizziness.  She is to stay out of PE and hydrate well.  If symptoms persist she is to check with endocrinologist regarding her adrenal insufficiency.  TSH is normal.  She has no anemia.  White blood cell count is normal.  Mixed hyperlipidemia-total cholesterol is 208.  She has a low HDL of 48 and triglycerides are elevated at 204 as well as LDL elevated at 126.  Currently not motivated to diet and exercise.  Impaired glucose tolerance-hemoglobin A1c 6.4%  Essential hypertension-stable on current regimen-has orthostasis today.  Needs to be well-hydrated and monitor blood pressure at home.  BMI 32.28-not really motivated to diet and exercise  History of melanoma metastatic to inguinal area and status post treatment with Nivolumab  History of depression and bipolar disorder seen by psychiatrist at [facility]  History of adrenal insufficiency treated with hydrocortisone  Plan: Take meclizine up to 25 mg 3 times a day for dizziness.  Rest and stay well-hydrated.  Stay out of the heat.  Call if symptoms persist.  Continue other medications as previously prescribed.  Watch diet.  Triglycerides are elevated at 204 and LDL cholesterol elevated at 126.  Try to walk some daily.  Follow-up in 6 months.

## 2019-08-01 LAB — CBC WITH DIFFERENTIAL/PLATELET
Absolute Monocytes: 576 cells/uL (ref 200–950)
Basophils Absolute: 67 cells/uL (ref 0–200)
Basophils Relative: 0.7 %
Eosinophils Absolute: 288 cells/uL (ref 15–500)
Eosinophils Relative: 3 %
HCT: 44.6 % (ref 35.0–45.0)
Hemoglobin: 15.3 g/dL (ref 11.7–15.5)
Lymphs Abs: 2198 cells/uL (ref 850–3900)
MCH: 29.4 pg (ref 27.0–33.0)
MCHC: 34.3 g/dL (ref 32.0–36.0)
MCV: 85.8 fL (ref 80.0–100.0)
MPV: 11.3 fL (ref 7.5–12.5)
Monocytes Relative: 6 %
Neutro Abs: 6470 cells/uL (ref 1500–7800)
Neutrophils Relative %: 67.4 %
Platelets: 279 10*3/uL (ref 140–400)
RBC: 5.2 10*6/uL — ABNORMAL HIGH (ref 3.80–5.10)
RDW: 12.5 % (ref 11.0–15.0)
Total Lymphocyte: 22.9 %
WBC: 9.6 10*3/uL (ref 3.8–10.8)

## 2019-08-01 LAB — LIPID PANEL
Cholesterol: 208 mg/dL — ABNORMAL HIGH (ref <200)
HDL: 48 mg/dL — ABNORMAL LOW (ref >=50)
LDL Cholesterol (Calc): 126 mg/dL (calc) — ABNORMAL HIGH
Non-HDL Cholesterol (Calc): 160 mg/dL (calc) — ABNORMAL HIGH (ref <130)
Total CHOL/HDL Ratio: 4.3 (calc) (ref <5.0)
Triglycerides: 204 mg/dL — ABNORMAL HIGH (ref <150)

## 2019-08-01 LAB — HEMOGLOBIN A1C
Hgb A1c MFr Bld: 6.4 % of total Hgb — ABNORMAL HIGH (ref <5.7)
Mean Plasma Glucose: 137 (calc)
eAG (mmol/L): 7.6 (calc)

## 2019-08-01 LAB — COMPLETE METABOLIC PANEL WITH GFR
AG Ratio: 2.1 (calc) (ref 1.0–2.5)
ALT: 26 U/L (ref 6–29)
AST: 20 U/L (ref 10–35)
Albumin: 4.5 g/dL (ref 3.6–5.1)
Alkaline phosphatase (APISO): 90 U/L (ref 37–153)
BUN: 17 mg/dL (ref 7–25)
CO2: 24 mmol/L (ref 20–32)
Calcium: 9.7 mg/dL (ref 8.6–10.4)
Chloride: 104 mmol/L (ref 98–110)
Creat: 0.93 mg/dL (ref 0.50–0.99)
GFR, Est African American: 73 mL/min/{1.73_m2} (ref >=60)
GFR, Est Non African American: 63 mL/min/{1.73_m2} (ref >=60)
Globulin: 2.1 g/dL (calc) (ref 1.9–3.7)
Glucose, Bld: 131 mg/dL — ABNORMAL HIGH (ref 65–99)
Potassium: 4.1 mmol/L (ref 3.5–5.3)
Sodium: 142 mmol/L (ref 135–146)
Total Bilirubin: 0.6 mg/dL (ref 0.2–1.2)
Total Protein: 6.6 g/dL (ref 6.1–8.1)

## 2019-08-01 LAB — TSH: TSH: 2.42 mIU/L (ref 0.40–4.50)

## 2019-08-03 NOTE — Patient Instructions (Addendum)
Take Meclizine up to 3 times daily for dizziness. Call if not better in 7-10 days or sooner if worse.  Try to walk some daily.  Stay well-hydrated.  Watch diet.  Continue current medications.  Return in 6 months.

## 2019-08-16 ENCOUNTER — Encounter: Payer: Self-pay | Admitting: Internal Medicine

## 2019-10-17 ENCOUNTER — Other Ambulatory Visit: Payer: Self-pay | Admitting: Internal Medicine

## 2019-11-09 ENCOUNTER — Telehealth: Payer: Self-pay | Admitting: Internal Medicine

## 2019-11-09 ENCOUNTER — Encounter: Payer: Self-pay | Admitting: Internal Medicine

## 2019-11-09 NOTE — Telephone Encounter (Signed)
rescheduled

## 2019-11-09 NOTE — Telephone Encounter (Signed)
Unable to LVM, mail box was full, need to reschedule CPE to different day, office going to be closed that afternoon.

## 2019-11-21 ENCOUNTER — Other Ambulatory Visit: Payer: Self-pay

## 2019-11-21 ENCOUNTER — Ambulatory Visit (AMBULATORY_SURGERY_CENTER): Payer: Self-pay | Admitting: *Deleted

## 2019-11-21 VITALS — Ht 65.0 in | Wt 201.0 lb

## 2019-11-21 DIAGNOSIS — Z86010 Personal history of colonic polyps: Secondary | ICD-10-CM

## 2019-11-21 MED ORDER — PEG 3350-KCL-NA BICARB-NACL 420 G PO SOLR: 4000.0000 mL | Freq: Once | ORAL | 0 refills | Status: AC

## 2019-11-21 NOTE — Progress Notes (Signed)
Patient is here in-person for PV. Patient denies any allergies to eggs or soy. Patient denies any problems with anesthesia/sedation. Patient denies any oxygen use at home. Patient denies taking any diet/weight loss medications or blood thinners. Patient is not being treated for MRSA or C-diff. Patient is aware of our care-partner policy and UCLTV-98 safety protocol.  COVID-19 vaccines completed on 04/2019, per patient.

## 2019-11-22 ENCOUNTER — Encounter: Payer: Self-pay | Admitting: Gastroenterology

## 2019-12-05 ENCOUNTER — Other Ambulatory Visit: Payer: Self-pay

## 2019-12-05 ENCOUNTER — Encounter: Payer: Self-pay | Admitting: Gastroenterology

## 2019-12-05 ENCOUNTER — Ambulatory Visit (AMBULATORY_SURGERY_CENTER): Payer: Medicare Other | Admitting: Gastroenterology

## 2019-12-05 VITALS — BP 160/93 | HR 69 | Temp 96.8°F | Resp 21 | Ht 65.0 in | Wt 201.0 lb

## 2019-12-05 DIAGNOSIS — Z86010 Personal history of colonic polyps: Secondary | ICD-10-CM | POA: Diagnosis not present

## 2019-12-05 DIAGNOSIS — D122 Benign neoplasm of ascending colon: Secondary | ICD-10-CM | POA: Diagnosis not present

## 2019-12-05 DIAGNOSIS — D124 Benign neoplasm of descending colon: Secondary | ICD-10-CM | POA: Diagnosis not present

## 2019-12-05 DIAGNOSIS — D123 Benign neoplasm of transverse colon: Secondary | ICD-10-CM

## 2019-12-05 HISTORY — PX: COLONOSCOPY: SHX174

## 2019-12-05 MED ORDER — SODIUM CHLORIDE 0.9 % IV SOLN: 500.0000 mL | Freq: Once | INTRAVENOUS | Status: DC

## 2019-12-05 NOTE — Progress Notes (Signed)
PT taken to PACU. Monitors in place. VSS. Report given to RN. 

## 2019-12-05 NOTE — Progress Notes (Signed)
Pt's states no medical or surgical changes since previsit or office visit.  [facility] [place] 

## 2019-12-05 NOTE — Op Note (Signed)
[facility] Patient Name: Sarah [name] Procedure Date: 12/05/2019 1:21 PM MRN: [id number] Endoscopist: Mallie Mussel L. Loletha Carrow , MD Age: 69 Referring MD:  Date of Birth: 1950/07/04 Gender: Female Account #: [id number] Procedure:                Colonoscopy Indications:              Surveillance: History of numerous (> 10) adenomas                            on last colonoscopy (< 3 yrs) - [age] TA, TVA                            and SSP polyps on patient's first colonoscopy Oct                            2020 Medicines:                Monitored Anesthesia Care Procedure:                Pre-Anesthesia Assessment:                           - Prior to the procedure, a History and Physical                            was performed, and patient medications and                            allergies were reviewed. The patient's tolerance of                            previous anesthesia was also reviewed. The risks                            and benefits of the procedure and the sedation                            options and risks were discussed with the patient.                            All questions were answered, and informed consent                            was obtained. Prior Anticoagulants: The patient has                            taken no previous anticoagulant or antiplatelet                            agents. ASA Grade Assessment: III - A patient with                            severe systemic disease. After reviewing the risks  and benefits, the patient was deemed in                            satisfactory condition to undergo the procedure.                           After obtaining informed consent, the colonoscope                            was passed under direct vision. Throughout the                            procedure, the patient's blood pressure, pulse, and                            oxygen saturations were monitored continuously. The                             Colonoscope was introduced through the anus and                            advanced to the the cecum, identified by                            appendiceal orifice and ileocecal valve. The                            colonoscopy was somewhat difficult due to a                            redundant colon and significant looping. Successful                            completion of the procedure was aided by using                            manual pressure. The patient tolerated the                            procedure well. The quality of the bowel                            preparation was good. The ileocecal valve,                            appendiceal orifice, and rectum were photographed.                            The bowel preparation used was GoLYTELY. Scope In: 1:28:14 PM Scope Out: 1:51:27 PM Scope Withdrawal Time: 0 hours 16 minutes 57 seconds  Total Procedure Duration: 0 hours 23 minutes 13 seconds  Findings:                 The perianal and digital rectal examinations were  normal.                           Four sessile polyps were found in the transverse                            colon and ascending colon. The polyps were 2 to 5                            mm in size. These polyps were removed with a cold                            snare. Resection and retrieval were complete.                           A 8 mm polyp was found in the proximal descending                            colon. The polyp was semi-pedunculated. The polyp                            was removed with a hot snare. Resection and                            retrieval were complete.                           A few diverticula were found in the left colon.                           The sigmoid colon was redundant.                           The exam was otherwise without abnormality on                            direct and retroflexion views. Complications:             No immediate complications. Estimated Blood Loss:     Estimated blood loss was minimal. Impression:               - Four 2 to 5 mm polyps in the transverse colon and                            in the ascending colon, removed with a cold snare.                            Resected and retrieved.                           - One 8 mm polyp in the proximal descending colon,                            removed with a hot snare. Resected and retrieved.                           -  Diverticulosis in the left colon.                           - Redundant colon.                           - The examination was otherwise normal on direct                            and retroflexion views. Recommendation:           - Patient has a contact number available for                            emergencies. The signs and symptoms of potential                            delayed complications were discussed with the                            patient. Return to normal activities tomorrow.                            Written discharge instructions were provided to the                            patient.                           - Resume previous diet.                           - Continue present medications.                           - Await pathology results.                           - Repeat colonoscopy in 1 year for surveillance. Sarah [name] L. Loletha Carrow, MD 12/05/2019 2:00:37 PM This report has been signed electronically.

## 2019-12-05 NOTE — Patient Instructions (Signed)
Please read handouts provided. Continue present medications. Await pathology results. Repeat colonoscopy in 1 year for surveillance.     YOU HAD AN ENDOSCOPIC PROCEDURE TODAY AT THE [facility]:   Refer to the procedure report that was given to you for any specific questions about what was found during the examination.  If the procedure report does not answer your questions, please call your gastroenterologist to clarify.  If you requested that your care partner not be given the details of your procedure findings, then the procedure report has been included in a sealed envelope for you to review at your convenience later.  YOU SHOULD EXPECT: Some feelings of bloating in the abdomen. Passage of more gas than usual.  Walking can help get rid of the air that was put into your GI tract during the procedure and reduce the bloating. If you had a lower endoscopy (such as a colonoscopy or flexible sigmoidoscopy) you may notice spotting of blood in your stool or on the toilet paper. If you underwent a bowel prep for your procedure, you may not have a normal bowel movement for a few days.  Please Note:  You might notice some irritation and congestion in your nose or some drainage.  This is from the oxygen used during your procedure.  There is no need for concern and it should clear up in a day or so.  SYMPTOMS TO REPORT IMMEDIATELY:   Following lower endoscopy (colonoscopy or flexible sigmoidoscopy):  Excessive amounts of blood in the stool  Significant tenderness or worsening of abdominal pains  Swelling of the abdomen that is new, acute  Fever of 100F or higher    For urgent or emergent issues, a gastroenterologist can be reached at any hour by calling [phone]. Do not use MyChart messaging for urgent concerns.    DIET:  We do recommend a small meal at first, but then you may proceed to your regular diet.  Drink plenty of fluids but you should avoid alcoholic beverages for 24  hours.  ACTIVITY:  You should plan to take it easy for the rest of today and you should NOT DRIVE or use heavy machinery until tomorrow (because of the sedation medicines used during the test).    FOLLOW UP: Our staff will call the number listed on your records 48-72 hours following your procedure to check on you and address any questions or concerns that you may have regarding the information given to you following your procedure. If we do not reach you, we will leave a message.  We will attempt to reach you two times.  During this call, we will ask if you have developed any symptoms of COVID 19. If you develop any symptoms (ie: fever, flu-like symptoms, shortness of breath, cough etc.) before then, please call [phone].  If you test positive for Covid 19 in the 2 weeks post procedure, please call and report this information to us.    If any biopsies were taken you will be contacted by phone or by letter within the next 1-3 weeks.  Please call us at [phone] if you have not heard about the biopsies in 3 weeks.    SIGNATURES/CONFIDENTIALITY: You and/or your care partner have signed paperwork which will be entered into your electronic medical record.  These signatures attest to the fact that that the information above on your After Visit Summary has been reviewed and is understood.  Full responsibility of the confidentiality of this discharge information lies with   and/or your care-partner. 

## 2019-12-05 NOTE — Progress Notes (Signed)
Called to room to assist during endoscopic procedure.  Patient ID and intended procedure confirmed with present staff. Received instructions for my participation in the procedure from the performing physician.  

## 2019-12-07 ENCOUNTER — Telehealth: Payer: Self-pay

## 2019-12-07 NOTE — Telephone Encounter (Signed)
Second post procedure follow up call, no answer 

## 2019-12-07 NOTE — Telephone Encounter (Signed)
First post procedure follow up call, no answer 

## 2019-12-12 ENCOUNTER — Encounter: Payer: Self-pay | Admitting: Gastroenterology

## 2020-01-15 ENCOUNTER — Encounter: Payer: Self-pay | Admitting: Internal Medicine

## 2020-01-15 ENCOUNTER — Other Ambulatory Visit: Payer: Self-pay

## 2020-01-15 ENCOUNTER — Telehealth: Payer: Self-pay | Admitting: Internal Medicine

## 2020-01-15 ENCOUNTER — Ambulatory Visit (INDEPENDENT_AMBULATORY_CARE_PROVIDER_SITE_OTHER): Payer: Medicare Other | Admitting: Internal Medicine

## 2020-01-15 VITALS — HR 84 | Temp 98.1°F

## 2020-01-15 DIAGNOSIS — R609 Edema, unspecified: Secondary | ICD-10-CM

## 2020-01-15 DIAGNOSIS — C4371 Malignant melanoma of right lower limb, including hip: Secondary | ICD-10-CM | POA: Diagnosis not present

## 2020-01-15 DIAGNOSIS — I10 Essential (primary) hypertension: Secondary | ICD-10-CM

## 2020-01-15 DIAGNOSIS — R059 Cough, unspecified: Secondary | ICD-10-CM | POA: Diagnosis not present

## 2020-01-15 DIAGNOSIS — E8881 Metabolic syndrome: Secondary | ICD-10-CM

## 2020-01-15 DIAGNOSIS — Z6832 Body mass index (BMI) 32.0-32.9, adult: Secondary | ICD-10-CM

## 2020-01-15 DIAGNOSIS — E782 Mixed hyperlipidemia: Secondary | ICD-10-CM

## 2020-01-15 DIAGNOSIS — R7302 Impaired glucose tolerance (oral): Secondary | ICD-10-CM | POA: Diagnosis not present

## 2020-01-15 DIAGNOSIS — E785 Hyperlipidemia, unspecified: Secondary | ICD-10-CM

## 2020-01-15 DIAGNOSIS — Z8639 Personal history of other endocrine, nutritional and metabolic disease: Secondary | ICD-10-CM

## 2020-01-15 DIAGNOSIS — F3176 Bipolar disorder, in full remission, most recent episode depressed: Secondary | ICD-10-CM

## 2020-01-15 MED ORDER — ONDANSETRON HCL 4 MG PO TABS: 4.0000 mg | ORAL_TABLET | Freq: Three times a day (TID) | ORAL | 0 refills | Status: DC | PRN

## 2020-01-15 MED ORDER — BENZONATATE 100 MG PO CAPS: 100.0000 mg | ORAL_CAPSULE | Freq: Three times a day (TID) | ORAL | 0 refills | Status: DC | PRN

## 2020-01-15 MED ORDER — AZITHROMYCIN 250 MG PO TABS: ORAL_TABLET | ORAL | 0 refills | Status: DC

## 2020-01-15 NOTE — Telephone Encounter (Signed)
We are able to do car visit

## 2020-01-15 NOTE — Progress Notes (Signed)
   Subjective:    Patient ID: Sarah [name], female    DOB: 09-May-1950, 69 y.o.   MRN: [id number]  HPI 69 year old Female seen with onset of URI symptoms in the night with cough and congestion. Did go to church yesterday. Has malaise and fatigue. Non productive cough. No fever. Vaccinated for Covid. Did not have flu vaccine.  History of adrenal insufficiency followed at [facility].  History of impaired glucose tolerance.  History of hyper cholesterolemia treated with Zocor.  History of left shoulder arthropathy treated in the fall 2019 with a cortisone injection.  History of stage III melanoma diagnosed 2018 after podiatrist performed a right foot biopsy on the lesion.  Treated at [facility] with wide local excision and took 10 treatments of Nivolumab.  There has been no evidence of metastatic disease in chest abdomen or pelvis.  She had a right inguinal node positive for melanoma x2 and has close surveillance at [facility].  Breast lump 2.5 mm with ulceration.  Followed by psychiatrist for depression.  History of chronic back pain.  Does not smoke or consume alcohol.  She is allergic to penicillin.  History of dependent edema and hypokalemia secondary to diuretic therapy.  History of hypertension.  Review of Systems-see above-says she feels awful.     Objective:   Physical Exam Looks pale. Covid-19 test obtained. Has coarse breath sounds LLL. TMs clear. Pharynx clear. Sounds nasally congested.  She is afebrile.  Pulse oximetry is normal. Respiratory virus panel and COVID-19 tests are obtained.     Assessment & Plan:  Acute respiratory infection- tests pending.  Plan: Respiratory virus panel and COVID-19 test are pending.  She was treated with Zithromax Z-PAK 2 p.o. day 1 followed by 1 p.o. days 2 through 5 as well as Tessalon Perles 100 mg up to 3 times daily as needed for cough.  Rest and drink plenty of fluids.  She is complaining of nausea and I have given her Zofran to take every 8 hours as needed  for nausea.  COVID-19 and respiratory virus panels are pending.

## 2020-01-15 NOTE — Telephone Encounter (Signed)
Sarah [name] [phone]  Called to talk with Luiza about canceling labs for CPE and she was sick, Cough, sore throat, headache, left ear ache, runny nose, runny eyes, just does not feel well at all. No Fever, No COVID exposure that she knows of. Had COVID-19 Booster vaccine last week.

## 2020-01-15 NOTE — Patient Instructions (Signed)
COVID-19 test and respiratory virus panel are pending.  Take Zithromax Z-PAK as directed and Tessalon perles as needed for cough.  Drink plenty of fluids and rest.

## 2020-01-15 NOTE — Telephone Encounter (Signed)
Scheduled

## 2020-01-16 ENCOUNTER — Other Ambulatory Visit: Payer: Medicare Other | Admitting: Internal Medicine

## 2020-01-17 ENCOUNTER — Encounter: Payer: Medicare Other | Admitting: Internal Medicine

## 2020-01-17 LAB — RESPIRATORY VIRUS PANEL
Adenovirus B: NOT DETECTED
HUMAN PARAINFLU VIRUS 1: NOT DETECTED
HUMAN PARAINFLU VIRUS 2: NOT DETECTED
HUMAN PARAINFLU VIRUS 3: NOT DETECTED
INFLUENZA A SUBTYPE H1: NOT DETECTED
INFLUENZA A SUBTYPE H3: NOT DETECTED
Influenza A: NOT DETECTED
Influenza B: NOT DETECTED
Metapneumovirus: NOT DETECTED
Respiratory Syncytial Virus A: NOT DETECTED
Respiratory Syncytial Virus B: NOT DETECTED
Rhinovirus: DETECTED — AB

## 2020-01-17 LAB — SARS-COV-2 RNA,(COVID-19) QUALITATIVE NAAT: SARS CoV2 RNA: NOT DETECTED

## 2020-01-18 ENCOUNTER — Encounter: Payer: Medicare Other | Admitting: Internal Medicine

## 2020-01-22 ENCOUNTER — Telehealth (INDEPENDENT_AMBULATORY_CARE_PROVIDER_SITE_OTHER): Payer: Medicare Other | Admitting: Internal Medicine

## 2020-01-22 ENCOUNTER — Encounter: Payer: Self-pay | Admitting: Internal Medicine

## 2020-01-22 DIAGNOSIS — J069 Acute upper respiratory infection, unspecified: Secondary | ICD-10-CM

## 2020-01-22 DIAGNOSIS — H8110 Benign paroxysmal vertigo, unspecified ear: Secondary | ICD-10-CM

## 2020-01-22 MED ORDER — MECLIZINE HCL 25 MG PO TABS: 25.0000 mg | ORAL_TABLET | Freq: Three times a day (TID) | ORAL | 1 refills | Status: DC | PRN

## 2020-01-22 NOTE — Telephone Encounter (Addendum)
[name] [phone]  [name] called to say that her mom seems to be really dizzy since she was sick last week, today is the first day she has come down stairs and she can not go back up because she is so dizzy. Her cough is sounding better.-Phone intake per Salomon Mast.   Telephone note-Dr. Lenord Fellers Patient has a history of benign positional vertigo.  She was seen in July with similar symptoms and was treated with meclizine with success.  I was able to connect with her today by telephone.  She was not able to connect virtually due to power outage is in her area.  She is identified using 2 identifiers as Sarah [name], a patient in this practice.  She is agreeable to visit in this format today.  She is at her home and I am in my office.  On December 27, she was seen in the office with a upper respiratory infection.  She was treated with Zithromax Z-PAK, Tessalon Perles and Zofran for nausea.  She has been slow to improve and still has respiratory congestion.  No documented fever.  Records indicate she has had 2 COVID-19 immunizations.  Patient has history of depression, malignant melanoma followed at [facility], impaired glucose tolerance, hyperlipidemia treated with Zocor, chronic back pain, dependent edema, hypokalemia secondary to diuretic therapy, hypertension.  On December 27 she was tested with respiratory virus panel and COVID-19.  Respiratory virus panel was positive for rhinovirus.  COVID-19 test was negative.  She is not on any immunosuppressant therapy at this time.  Patient says room is going around when she tries to stand.  Says she nearly fell trying to ambulate.  Daughter is with her.  Does not have meclizine on hand at the present time but says that work when she was treated in July for similar symptoms.  Sounds nasally congested when she speaks.  Says cough is not productive.  Denies fever, shaking chills, nausea vomiting diarrhea or dysgeusia.  Feels that she does  not need to go to the hospital at this point in time.  Would like prescription refill for vertigo.  Denies headache or neck stiffness.  Patient resides alone.  Denies being dehydrated.  Is urinating several times daily.  No vomiting.  By history she has benign positional vertigo.  This time it seems to be triggered by an upper respiratory infection.  She was seen here December 27 and treated with antibiotic, Zofran and Tessalon Perles.  Now would like something for vertigo.  Seems to be stable at the present time.  Advised staying at home and drinking plenty of fluids.  Will call in meclizine 25 mg 3 times daily as needed for vertigo.  Monitor symptoms and call if worse.  Time spent with patient on phone and reviewing records as well as a prescribing medication is 10 minutes.

## 2020-02-16 ENCOUNTER — Telehealth: Payer: Self-pay | Admitting: Internal Medicine

## 2020-02-16 MED ORDER — SIMVASTATIN 10 MG PO TABS: ORAL_TABLET | ORAL | 0 refills | Status: DC

## 2020-02-16 NOTE — Telephone Encounter (Signed)
Received Fax RX request from  Pharmacy -   [id number] [place], [address] Phone:  [phone]  Fax:  [phone]       Medication - simvastatin (ZOCOR) 10 MG tablet   Last Refill - 12/20/2019   Last OV - 01/15/2020  Last CPE - 01/16/2019  Next Appointment - Needs CPE it was due 01/17/2020 and was canceled

## 2020-02-16 NOTE — Telephone Encounter (Signed)
Reschedule CPE for April and refill med until then

## 2020-03-12 ENCOUNTER — Other Ambulatory Visit: Payer: Self-pay

## 2020-03-12 DIAGNOSIS — E8881 Metabolic syndrome: Secondary | ICD-10-CM

## 2020-03-12 DIAGNOSIS — E7800 Pure hypercholesterolemia, unspecified: Secondary | ICD-10-CM

## 2020-03-12 DIAGNOSIS — C4371 Malignant melanoma of right lower limb, including hip: Secondary | ICD-10-CM

## 2020-03-12 DIAGNOSIS — F321 Major depressive disorder, single episode, moderate: Secondary | ICD-10-CM

## 2020-03-12 DIAGNOSIS — E782 Mixed hyperlipidemia: Secondary | ICD-10-CM

## 2020-03-12 DIAGNOSIS — Z0000 Encounter for general adult medical examination without abnormal findings: Secondary | ICD-10-CM

## 2020-03-12 DIAGNOSIS — I10 Essential (primary) hypertension: Secondary | ICD-10-CM

## 2020-03-12 DIAGNOSIS — R7302 Impaired glucose tolerance (oral): Secondary | ICD-10-CM

## 2020-03-12 DIAGNOSIS — F3176 Bipolar disorder, in full remission, most recent episode depressed: Secondary | ICD-10-CM

## 2020-03-12 DIAGNOSIS — E785 Hyperlipidemia, unspecified: Secondary | ICD-10-CM

## 2020-03-28 ENCOUNTER — Other Ambulatory Visit: Payer: Self-pay | Admitting: Internal Medicine

## 2020-04-19 LAB — HM DIABETES EYE EXAM

## 2020-04-23 ENCOUNTER — Encounter: Payer: Self-pay | Admitting: Internal Medicine

## 2020-04-23 ENCOUNTER — Other Ambulatory Visit: Payer: Self-pay

## 2020-04-23 ENCOUNTER — Ambulatory Visit (INDEPENDENT_AMBULATORY_CARE_PROVIDER_SITE_OTHER): Payer: Medicare Other | Admitting: Internal Medicine

## 2020-04-23 VITALS — BP 130/80 | HR 85 | Ht 65.0 in | Wt 203.0 lb

## 2020-04-23 DIAGNOSIS — R609 Edema, unspecified: Secondary | ICD-10-CM

## 2020-04-23 DIAGNOSIS — I10 Essential (primary) hypertension: Secondary | ICD-10-CM | POA: Diagnosis not present

## 2020-04-23 DIAGNOSIS — E782 Mixed hyperlipidemia: Secondary | ICD-10-CM

## 2020-04-23 DIAGNOSIS — F321 Major depressive disorder, single episode, moderate: Secondary | ICD-10-CM

## 2020-04-23 DIAGNOSIS — Z8639 Personal history of other endocrine, nutritional and metabolic disease: Secondary | ICD-10-CM

## 2020-04-23 DIAGNOSIS — R7302 Impaired glucose tolerance (oral): Secondary | ICD-10-CM

## 2020-04-23 DIAGNOSIS — E7800 Pure hypercholesterolemia, unspecified: Secondary | ICD-10-CM

## 2020-04-23 DIAGNOSIS — Z0000 Encounter for general adult medical examination without abnormal findings: Secondary | ICD-10-CM

## 2020-04-23 DIAGNOSIS — E8881 Metabolic syndrome: Secondary | ICD-10-CM

## 2020-04-23 DIAGNOSIS — F3176 Bipolar disorder, in full remission, most recent episode depressed: Secondary | ICD-10-CM

## 2020-04-23 DIAGNOSIS — C4371 Malignant melanoma of right lower limb, including hip: Secondary | ICD-10-CM

## 2020-04-23 DIAGNOSIS — Z6833 Body mass index (BMI) 33.0-33.9, adult: Secondary | ICD-10-CM

## 2020-04-23 DIAGNOSIS — E785 Hyperlipidemia, unspecified: Secondary | ICD-10-CM

## 2020-04-23 LAB — POCT URINALYSIS DIPSTICK
Appearance: NEGATIVE
Bilirubin, UA: NEGATIVE
Blood, UA: NEGATIVE
Glucose, UA: NEGATIVE
Ketones, UA: NEGATIVE
Leukocytes, UA: NEGATIVE
Nitrite, UA: NEGATIVE
Odor: NEGATIVE
Protein, UA: NEGATIVE
Spec Grav, UA: 1.015 (ref 1.010–1.025)
Urobilinogen, UA: 0.2 E.U./dL
pH, UA: 6 (ref 5.0–8.0)

## 2020-04-23 MED ORDER — FUROSEMIDE 40 MG PO TABS: 40.0000 mg | ORAL_TABLET | Freq: Every day | ORAL | 1 refills | Status: DC

## 2020-04-23 NOTE — Progress Notes (Signed)
Subjective:    Patient ID: Sarah [name], female    DOB: 10-12-1950, 70 y.o.   MRN: [id number]  HPI 70 year old Female for health maintenance exam and evaluation of medical issues.  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  Resolution depth was 2.8 mm with ulceration.  Treated at [facility] with wide local excision and took 10 treatments of Nivolumab.  There has been no evidence of metastatic disease in chest, abdomen or pelvis..  She had a right inguinal node positive for melanoma x2.  Has close surveillance at [facility].  Is seeing psychiatrist at [facility] for depression.  History of left shoulder arthropathy treated in the fall 2019 with cortisone injection.    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  History of adrenal insufficiency followed at [facility].  Has had knee injections at [facility] in 2018.  Has had steroid neck injections with history of cervical disc disease.  Has been tried on Lyrica and gabapentin by physiatrist at [facility] around 2018.  History of impaired glucose tolerance.  History of hyperlipidemia and is on statin therapy consisting of Zocor.  History of dependent edema treated with diuretic and hypokalemia secondary to diuretic therapy.  History of hypertension.  Has had multiple suicide attempts by overdose.  Last attempt was August 2012.  Hospitalized April 2011 with drug overdose including clonazepam, Ambien, gabapentin.  Has been depressed since her husband died of complications of pancreatic cancer in 2002.  In July 2003 she overdosed on Ambien, Restoril and Seroquel.  In 2004 she was hospitalized for 3 days at [facility].  History of bipolar disorder.  Previously followed by Dr. Letta Moynahan who is retired from [employer].  Now being seen at [facility].  History of orthostasis September 2019 with no further episodes.  In 2019 she  gained 13 pounds.  In 2020 she gained an additional 15 pounds.  In 2021 she weighed 194 pounds. Currently weighs 203 pounds- may want to consider [facility]  She is allergic to Penicillin.  Does not smoke or consume alcohol.  Several years ago she was hospitalized for chest pain in [facility] for MI was ruled out.  She did have lumbar fusion in 2003 for chronic back pain by Dr. Ellene Route and that did seem to help her chronic back pain.  She slipped on some steps at work in 2000 and began to have severe back problems which subsequently resulted in her obtaining disability benefits.  She was under the care of Dr. Nicholaus Bloom for chronic pain management for a long time.  Bilateral tubal ligation 1982, D&C 1989, laparoscopic cholecystectomy 1994, lumbar fusion 2003, hysterectomy with BSO in 2005, fractured left elbow 2006.  Social history: She has 1 adult daughter and 1 grandson.  Daughter has had some health problems.  She remarried after her husband died the second marriage did not work out.  She is now divorced.  Formerly worked for [employer]  Family history: Mother resided in [place] and died about 4 years ago.  Mother had history of bipolar disorder and dementia with history of CABG.  Father died with lung cancer but was a non-smoker.  1 brother with history of lupus.  1 sister with history of COPD.    Review of Systems Had colonoscopy 2021 with follow up November 2022     Objective:   Physical  Exam Blood pressure 130/80 pulse 85 pulse oximetry 97% weight 203 pounds BMI 33.78  Skin: Warm and dry.  Nodes none.  TMs are clear.  Neck is supple, without JVD, thyromegaly or carotid bruits.  Chest is clear to auscultation without rales or wheezing.  Breasts are without masses.  Abdomen is obese soft nondistended without hepatosplenomegaly masses or tenderness.  Cardiac exam: Regular rate and rhythm without murmurs or gallops.  No  pitting edema of the lower extremities.  Neuro: No gross focal deficits on brief neurological exam.  Affect is within normal limits.       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  Potassium is normal.  Controlled type 2 diabetes mellitus-hemoglobin A1c 6.7% and was 6.4% in July 2021.  Weight gain-continues over the last couple of years.  Needs to consider [facility] or some other form of weight loss therapy.  Family history of coronary artery disease in mother  History of left shoulder arthropathy  History of orthostasis-?  Related to adrenal insufficiency.  Prior to her diagnosis of adrenal insufficiency she was having issues with syncope but falls have improved.  Plan: She will continue with current medications.  Encourage diet exercise and weight loss.  Continue to see her physicians at [facility].  Have tried to find out where she had a previous mammogram as she could not remember where she went in [place].  I made several telephone calls but could not identify the place where she went but have ordered a 3D screening mammogram at [place] [facility] at [facility] (this was done on Jun 03, 2020) and is normal.  Follow-up in 1 year or as needed.  Subjective:   Patient presents for Medicare Annual/Subsequent preventive examination.  Review Past Medical/Family/Social: See above   Risk Factors  Current exercise habits: Not a lot of exercise Dietary issues discussed:   Cardiac risk factors: Family history in mother who had CABG, hyperlipidemia  Depression Screen  (Note:  if answer to either of the following is "Yes", a more complete depression screening is indicated)   Over the past two weeks, have you felt down, depressed or hopeless? No  Over the past two weeks, have you felt little interest or pleasure in doing things? No Have you lost interest or pleasure in daily life? No Do you often feel hopeless? No Do you cry easily over simple problems? No   Activities of Daily Living  In your present state of health, do you have any difficulty performing the following activities?:   Driving? No  Managing money? No  Feeding yourself? No  Getting from bed to chair? No  Climbing a flight of stairs? No  Preparing food and eating?: No  Bathing or showering? No  Getting dressed: No  Getting to the toilet? No  Using the toilet:No  Moving around from place to place: No  In the past year have you fallen or had a near fall?:  Yes Are you sexually active? No  Do you have more than one partner? No   Hearing Difficulties: No  Do you often ask people to speak up or repeat themselves?  Yes Do you  experience ringing or noises in your ears?  Yes Do you have difficulty understanding soft or whispered voices?  Yes Do you feel that you have a problem with memory? No Do you often misplace items? No    Home Safety:  Do you have a smoke alarm at your residence? Yes Do you have grab bars in the bathroom?  No Do you have throw rugs in your house?  Yes   Cognitive Testing  Alert? Yes Normal Appearance?Yes  Oriented to person? Yes Place? Yes  Time? Yes  Recall of three objects?  Not tested Can perform simple calculations? Yes  Displays appropriate judgment?Yes  Can read the correct time from a watch face?Yes   List the Names of Other Physician/Practitioners you currently use:  See referral list for the physicians patient is currently seeing.     Review of Systems: See above   Objective:     General appearance: Appears stated age and  obese  Head:  Normocephalic, without obvious abnormality, atraumatic  Eyes: conj clear, EOMi PEERLA  Ears: normal TM's and external ear canals both ears  Nose: Nares normal. Septum midline. Mucosa normal. No drainage or sinus tenderness.  Throat: lips, mucosa, and tongue normal; teeth and gums normal  Neck: no adenopathy, no carotid bruit, no JVD, supple, symmetrical, trachea midline and thyroid not enlarged, symmetric, no tenderness/mass/nodules  No CVA tenderness.  Lungs: clear to auscultation bilaterally  Breasts: normal appearance, no masses or tenderness Heart: regular rate and rhythm, S1, S2 normal, no murmur, click, rub or gallop  Abdomen: soft, non-tender; bowel sounds normal; no masses, no organomegaly  Musculoskeletal: ROM normal in all joints, no crepitus, no deformity, Normal muscle strengthen. Back  is symmetric, no curvature. Skin: Skin color, texture, turgor normal. No rashes or lesions  Lymph nodes: Cervical, supraclavicular, and axillary nodes normal.  Neurologic: CN 2 -12 Normal, Normal symmetric reflexes. Normal coordination and gait  Psych: Alert & Oriented x 3, Mood appear stable.    Assessment:    Annual wellness medicare exam   Plan:    During the course of the visit the patient was educated and counseled about appropriate screening and preventive services including:   Mammogram ordered  Had colonoscopy 2021  Recommend Shingrix vaccine     Patient Instructions (the written plan) was given to the patient.  Medicare Attestation  I have personally reviewed:  The patient's medical and social history  Their use of alcohol, tobacco or illicit drugs  Their current medications and supplements  The patient's functional ability including ADLs,fall risks, home safety risks, cognitive, and hearing and visual impairment  Diet and physical activities  Evidence for depression or mood disorders  The patient's weight, height, BMI, and visual acuity have been recorded in the chart.  I have made referrals, counseling, and provided education to the patient based on review of the above and I have provided the patient with a written personalized care plan for preventive services.

## 2020-04-24 LAB — COMPLETE METABOLIC PANEL WITH GFR
AG Ratio: 1.9 (calc) (ref 1.0–2.5)
ALT: 30 U/L — ABNORMAL HIGH (ref 6–29)
AST: 22 U/L (ref 10–35)
Albumin: 4.4 g/dL (ref 3.6–5.1)
Alkaline phosphatase (APISO): 100 U/L (ref 37–153)
BUN: 17 mg/dL (ref 7–25)
CO2: 29 mmol/L (ref 20–32)
Calcium: 9.6 mg/dL (ref 8.6–10.4)
Chloride: 102 mmol/L (ref 98–110)
Creat: 0.89 mg/dL (ref 0.50–0.99)
GFR, Est African American: 77 mL/min/{1.73_m2} (ref >=60)
GFR, Est Non African American: 67 mL/min/{1.73_m2} (ref >=60)
Globulin: 2.3 g/dL (calc) (ref 1.9–3.7)
Glucose, Bld: 150 mg/dL — ABNORMAL HIGH (ref 65–99)
Potassium: 4.7 mmol/L (ref 3.5–5.3)
Sodium: 142 mmol/L (ref 135–146)
Total Bilirubin: 0.6 mg/dL (ref 0.2–1.2)
Total Protein: 6.7 g/dL (ref 6.1–8.1)

## 2020-04-24 LAB — CBC WITH DIFFERENTIAL/PLATELET
Absolute Monocytes: 375 cells/uL (ref 200–950)
Basophils Absolute: 67 cells/uL (ref 0–200)
Basophils Relative: 1 %
Eosinophils Absolute: 369 cells/uL (ref 15–500)
Eosinophils Relative: 5.5 %
HCT: 40.5 % (ref 35.0–45.0)
Hemoglobin: 14 g/dL (ref 11.7–15.5)
Lymphs Abs: 1702 cells/uL (ref 850–3900)
MCH: 29.9 pg (ref 27.0–33.0)
MCHC: 34.6 g/dL (ref 32.0–36.0)
MCV: 86.4 fL (ref 80.0–100.0)
MPV: 11 fL (ref 7.5–12.5)
Monocytes Relative: 5.6 %
Neutro Abs: 4188 cells/uL (ref 1500–7800)
Neutrophils Relative %: 62.5 %
Platelets: 230 10*3/uL (ref 140–400)
RBC: 4.69 10*6/uL (ref 3.80–5.10)
RDW: 12.7 % (ref 11.0–15.0)
Total Lymphocyte: 25.4 %
WBC: 6.7 10*3/uL (ref 3.8–10.8)

## 2020-04-24 LAB — LIPID PANEL
Cholesterol: 246 mg/dL — ABNORMAL HIGH (ref <200)
HDL: 60 mg/dL (ref >=50)
LDL Cholesterol (Calc): 150 mg/dL (calc) — ABNORMAL HIGH
Non-HDL Cholesterol (Calc): 186 mg/dL (calc) — ABNORMAL HIGH (ref <130)
Total CHOL/HDL Ratio: 4.1 (calc) (ref <5.0)
Triglycerides: 221 mg/dL — ABNORMAL HIGH (ref <150)

## 2020-04-24 LAB — HEMOGLOBIN A1C
Hgb A1c MFr Bld: 6.7 % of total Hgb — ABNORMAL HIGH (ref <5.7)
Mean Plasma Glucose: 146 mg/dL
eAG (mmol/L): 8.1 mmol/L

## 2020-04-24 LAB — TSH: TSH: 2.26 mIU/L (ref 0.40–4.50)

## 2020-05-30 ENCOUNTER — Other Ambulatory Visit: Payer: Self-pay | Admitting: Internal Medicine

## 2020-05-30 DIAGNOSIS — Z1231 Encounter for screening mammogram for malignant neoplasm of breast: Secondary | ICD-10-CM

## 2020-06-03 ENCOUNTER — Other Ambulatory Visit: Payer: Self-pay

## 2020-06-03 ENCOUNTER — Ambulatory Visit
Admission: RE | Admit: 2020-06-03 | Discharge: 2020-06-03 | Disposition: A | Discharge status: Home / Self Care | Payer: Medicare Other | Source: Ambulatory Visit | Attending: Internal Medicine | Admitting: Internal Medicine

## 2020-06-03 DIAGNOSIS — Z1231 Encounter for screening mammogram for malignant neoplasm of breast: Secondary | ICD-10-CM | POA: Insufficient documentation

## 2020-06-15 NOTE — Patient Instructions (Signed)
It was a pleasure to see you today.  Please work on diet exercise and weight loss.  Consider [facility] for weight loss or some other weight loss program in your area.  Return in 1 year or as needed.  Continue to see your physicians at [facility].  No change in medications.

## 2020-08-02 ENCOUNTER — Other Ambulatory Visit: Payer: Self-pay | Admitting: Internal Medicine

## 2020-08-14 ENCOUNTER — Other Ambulatory Visit: Payer: Self-pay | Admitting: Internal Medicine

## 2020-10-09 ENCOUNTER — Telehealth: Payer: Medicare Other | Admitting: Internal Medicine

## 2020-10-09 MED ORDER — ZOLPIDEM TARTRATE 5 MG PO TABS: 5.0000 mg | ORAL_TABLET | Freq: Every evening | ORAL | 0 refills | Status: DC | PRN

## 2020-10-09 NOTE — Telephone Encounter (Signed)
Patient says she is having to find new psychiatrist. Recent psychiatrist was at [facility]. She is asking for referral. There are not many options in [place]. Needs Ambien refilled. Is out of this medication. [facility] [place] There is a prescription on file there that has not been picked up there for 90 days written in June. Patient says there are repairs being done to her house. She says she is staying with her daughter. I can refill this to [place] x 30 days not to exceed 90 days while she finds new psychiatrist. By law, can only send 5 mg electronically so I sent in 5 mg (#30) with no refill to [facility] in [place] where her daughter resides.

## 2020-10-10 DIAGNOSIS — Z0289 Encounter for other administrative examinations: Secondary | ICD-10-CM

## 2020-11-01 ENCOUNTER — Emergency Department (HOSPITAL_COMMUNITY)
Admission: EM | Admit: 2020-11-01 | Discharge: 2020-11-01 | Disposition: A | Discharge status: Home / Self Care | Payer: Medicare Other | Attending: Emergency Medicine | Admitting: Emergency Medicine

## 2020-11-01 ENCOUNTER — Other Ambulatory Visit: Payer: Self-pay

## 2020-11-01 DIAGNOSIS — R519 Headache, unspecified: Secondary | ICD-10-CM | POA: Diagnosis not present

## 2020-11-01 DIAGNOSIS — M79601 Pain in right arm: Secondary | ICD-10-CM | POA: Insufficient documentation

## 2020-11-01 DIAGNOSIS — E119 Type 2 diabetes mellitus without complications: Secondary | ICD-10-CM | POA: Insufficient documentation

## 2020-11-01 DIAGNOSIS — Z85820 Personal history of malignant melanoma of skin: Secondary | ICD-10-CM | POA: Diagnosis not present

## 2020-11-01 DIAGNOSIS — I10 Essential (primary) hypertension: Secondary | ICD-10-CM | POA: Diagnosis not present

## 2020-11-01 MED ORDER — HYDROCODONE-ACETAMINOPHEN 5-325 MG PO TABS
2.0000 | ORAL_TABLET | Freq: Once | ORAL | Status: AC
  Administered 2020-11-01: 2 via ORAL
  Filled 2020-11-01: qty 2

## 2020-11-01 MED ORDER — IBUPROFEN 400 MG PO TABS
400.0000 mg | ORAL_TABLET | Freq: Once | ORAL | Status: AC
  Administered 2020-11-01: 400 mg via ORAL
  Filled 2020-11-01: qty 1

## 2020-11-01 MED ORDER — HYDROCODONE-ACETAMINOPHEN 5-325 MG PO TABS: 1.0000 | ORAL_TABLET | Freq: Four times a day (QID) | ORAL | 0 refills | Status: DC | PRN

## 2020-11-01 NOTE — ED Provider Notes (Signed)
[facility] EMERGENCY DEPARTMENT Provider Note   CSN: [id number] Arrival date & time: 11/01/20  0005     History Chief Complaint  Patient presents with   Arm Pain    Sarah [name] is a 70 y.o. female.  The history is provided by the patient and a relative.  Arm Pain This is a new problem. The current episode started more than 2 days ago. The problem occurs daily. The problem has been gradually worsening. Associated symptoms include headaches. Pertinent negatives include no abdominal pain and no shortness of breath. Exacerbated by: Palpation and movement. The symptoms are relieved by rest.  Patient reports approximately 3 days ago she began having pain in her right forearm.  Over the past day this has  worsened and is now moving from her right forearm into her neck and her right upper chest. She reports mild headache.  No new numbness to her extremities.  She reports that she briefly felt like she could not grip well with her right hand but that has resolved.  No other weakness reported.  No new visual changes.  No shortness of breath. 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[place])    PMS (premenstrual syndrome)     Patient Active Problem List   Diagnosis Date Noted   History of antineoplastic chemotherapy 10/17/2017   Failure to thrive in adult 09/04/2017   Prolonged Q-T interval on ECG 09/04/2017   Nausea, vomiting, and  diarrhea 09/04/2017   Anxiety 09/04/2017   Severe episode of recurrent major depressive disorder, without psychotic features ([place]) 05/21/2017   Nausea 04/28/2017   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[place]) 01/07/2017   Surgical wound infection 12/21/2016   Superficial skin infection 12/18/2016   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[place]) 11/17/2016   Bipolar disorder ([place]) 12/26/2011   Depression 09/17/2010   Hypokalemia 09/17/2010   Hypertension 09/17/2010   Hyperlipidemia 09/17/2010   Dependent edema 09/17/2010   Diabetes mellitus ([place]) 09/17/2010    Past Surgical History:  Procedure Laterality Date   ABDOMINAL HYSTERECTOMY     BREAST BIOPSY Right    benign   CHOLECYSTECTOMY     COLONOSCOPY  11/08/2018   Danis-polyps   COLONOSCOPY  12/05/2019   Danis   DILATION AND CURETTAGE OF UTERUS     FOOT SURGERY Right 2018   removal of melanoma   LUMBAR FUSION     lymph node removal Right 2018   groin    POLYPECTOMY     TUBAL LIGATION       OB History   No obstetric history on file.     Family History  Problem Relation Age of Onset   Diabetes Mother    Mental illness Mother    Heart disease Mother    Lung cancer  Father    Colon cancer Neg Hx    Liver cancer Neg Hx    Pancreatic cancer Neg Hx    Esophageal cancer Neg Hx    Stomach cancer Neg Hx    Rectal cancer Neg Hx    Colon polyps Neg Hx    Breast cancer Neg Hx     Social History   Tobacco Use   Smoking status: Never   Smokeless tobacco: Never  Vaping Use   Vaping Use: Never used  Substance Use Topics   Alcohol use: Not Currently   Drug use: Never    Home Medications Prior to Admission medications   Medication Sig Start Date End Date Taking? Authorizing Provider  HYDROcodone-acetaminophen (NORCO/VICODIN) 5-325 MG tablet Take 1 tablet by mouth every 6 (six) hours as needed for severe pain. 11/01/20  Yes Ripley Fraise, MD  acetaminophen (TYLENOL) 500 MG tablet Take 1,000 mg by mouth daily as  needed for moderate pain.    [provider]  amitriptyline (ELAVIL) 50 MG tablet Take 50 mg by mouth at bedtime. 10/12/19   [provider]  azithromycin (ZITHROMAX) 250 MG tablet 2 po day 1 followed by one po days 2-5 01/15/20   Elby Showers, MD  benzonatate (TESSALON) 100 MG capsule Take 1 capsule (100 mg total) by mouth 3 (three) times daily as needed for cough. 01/15/20   Elby Showers, MD  cloNIDine (CATAPRES) 0.1 MG tablet Take 1 tablet (0.1 mg total) by mouth 2 (two) times daily. 05/27/15   Elby Showers, MD  Cyanocobalamin (VITAMIN B-12 PO) Take by mouth.    [provider]  ergocalciferol (VITAMIN D2) 50000 units capsule Take 1 weekly for 12 weeks 05/24/15   Elby Showers, MD  furosemide (LASIX) 40 MG tablet Take 1 tablet (40 mg total) by mouth daily. 04/23/20   Elby Showers, MD  hydrocortisone (CORTEF) 10 MG tablet Take 20 mg by mouth 3 (three) times daily. 09/27/17   [provider]  levocetirizine (XYZAL) 5 MG tablet Take 5 mg by mouth every evening.    [provider]  losartan (COZAAR) 50 MG tablet TAKE 1 TABLET BY MOUTH DAILY 06/21/17   Elby Showers, MD  meclizine (ANTIVERT) 25 MG tablet Take 1 tablet (25 mg total) by mouth 3 (three) times daily as needed for dizziness. 01/22/20   Elby Showers, MD  ondansetron (ZOFRAN) 4 MG tablet Take 1 tablet (4 mg total) by mouth every 8 (eight) hours as needed for nausea or vomiting. 01/15/20   Elby Showers, MD  pregabalin (LYRICA) 50 MG capsule Take by mouth. 06/30/19   [provider]  simvastatin (ZOCOR) 10 MG tablet TAKE 1 TABLET BY MOUTH EVERYDAY AT BEDTIME 08/14/20   Elby Showers, MD  zolpidem (AMBIEN) 5 MG tablet Take 1 tablet (5 mg total) by mouth at bedtime as needed for sleep. 10/09/20   Elby Showers, MD    Allergies    Doxycycline, Ciprofloxacin, and Penicillins  Review of Systems   Review of Systems  Constitutional:  Negative for fever.  Eyes:  Negative for visual  disturbance.  Respiratory:  Negative for shortness of breath.   Gastrointestinal:  Negative for abdominal pain.  Skin:  Negative for rash.  Neurological:  Positive for headaches. Negative for weakness and numbness.  All other systems reviewed and are negative.  Physical Exam Updated Vital Signs BP (!) 195/90   Pulse 69   Temp 97.8 F (36.6 C) (  Oral)   Resp 16   Ht 1.651 m (5\' 5" )   Wt 87.1 kg   SpO2 96%   BMI 31.95 kg/m   Physical Exam CONSTITUTIONAL: Well developed/well nourished HEAD: Normocephalic/atraumatic EYES: EOMI/PERRL, no ptosis ENMT: Mucous membranes moist NECK: supple no meningeal signs, no bruits Tenderness of the right cervical paraspinal region CV: S1/S2 noted, no murmurs/rubs/gallops noted Chest-mild tenderness to right upper chest.  No bruising or crepitus. LUNGS: Lungs are clear to auscultation bilaterally, no apparent distress ABDOMEN: soft, nontender, no rebound or guarding GU:no cva tenderness NEURO:Awake/alert, face symmetric, no arm or leg drift is noted Equal 5/5 strength with shoulder abduction, elbow flex/extension, wrist flex/extension in upper extremities and equal hand grips bilaterally Equal 5/5 strength with hip flexion,knee flex/extension, foot dorsi/plantar flexion Cranial nerves 3/4/5/6/07/27/08/11/12 tested and intact Sensation to light touch intact in all extremities Equal power (5/5) with hand grip, wrist flex/extension, elbow flex/extension, and equal power with shoulder abduction/adduction.  No focal sensory deficit to light touch is noted in either UE.   Equal biceps/brachioradialis reflex in bilateral UE EXTREMITIES: pulses normal, full ROM Tenderness noted to the right forearm and right bicep. No erythema, no edema.  Arms are symmetric.  Distal pulses equal and intact. No deformities to the upper extremities.  Full range of motion of both shoulders/elbow/wrist SKIN: warm, color normal, no rash noted to chest/arm PSYCH: no  abnormalities of mood noted  ED Results / Procedures / Treatments   Labs (all labs ordered are listed, but only abnormal results are displayed) Labs Reviewed - No data to display  EKG ED ECG REPORT   Date: 11/01/2020 0244  Rate: 67  Rhythm: normal sinus rhythm  QRS Axis: left  Intervals: normal  ST/T Wave abnormalities: normal  Conduction Disutrbances: LVH with repolarization  Narrative Interpretation:   Old EKG Reviewed: unchanged  I have personally reviewed the EKG tracing and agree with the computerized printout as noted.   Radiology No results found.  Procedures Procedures   Medications Ordered in ED Medications  HYDROcodone-acetaminophen (NORCO/VICODIN) 5-325 MG per tablet 2 tablet (2 tablets Oral Given 11/01/20 0218)    ED Course  I have reviewed the triage vital signs and the nursing notes.      MDM Rules/Calculators/A&P                         Patient presents for right arm pain of unclear etiology.  She thinks she may have slept on it wrong but it has not improved.  No trauma.  The pain for started in the forearm and is now in the right side of her neck and upper chest.  Her EKG is unremarkable.  She has no focal weakness in her extremities.  The pain is clearly reproducible on exam.  No signs of any acute neurologic or acute vascular emergency.  No signs of shingles or other rash. Patient does have previous history of left-sided neck pain with radiculopathy which is possible this could be contributing to tonight's complaint No indication for imaging at this time.  Patient request short course of pain medications at discharge Will have her follow-up with her PCP in 1 week Final Clinical Impression(s) / ED Diagnoses Final diagnoses:  Right arm pain    Rx / DC Orders ED Discharge Orders          Ordered    HYDROcodone-acetaminophen (NORCO/VICODIN) 5-325 MG tablet  Every 6 hours PRN        11/01/20  Ambler, Gerber Penza, MD 11/01/20  [phone]

## 2020-11-01 NOTE — ED Triage Notes (Signed)
Pt c/o right arm pain. States pain goes from forearm up to shoulder and into neck. States pain has been going on for the past 3 days.

## 2020-11-01 NOTE — Discharge Instructions (Signed)
Keep arm elevated.  If you have any new weakness in the arm, discoloration, or any new chest pain or shortness of breath in the next 1 to 2 days please return to the ER

## 2020-11-01 NOTE — ED Notes (Signed)
Went over d/c papers. Ambulatory to lobby  ?

## 2020-11-04 ENCOUNTER — Telehealth: Payer: Self-pay | Admitting: Internal Medicine

## 2020-11-04 NOTE — Telephone Encounter (Signed)
Called and let patient know to take Blood pressure 2-3 times a day and bring log to appointment that we scheduled on Friday

## 2020-11-04 NOTE — Telephone Encounter (Signed)
Sarah [name] [phone]  [name] called to say she had been to [facility] over the weekend with elevated blood pressure (197/103 and they had advised to follow up with PCP. I ask if she had been keeping log since she was at the ED and she said she did not have a blood pressure machine, but has ordered one from [place] that will be there tomorrow. I let her know normally when patients have blood pressure issues that Dr Lauree Chandler normally likes them to take blood pressure 2-3 times a day at different time for 2 weeks to see how it is running, so she can get a good idea of when it changes and how it is running.

## 2020-11-08 ENCOUNTER — Other Ambulatory Visit: Payer: Self-pay

## 2020-11-08 ENCOUNTER — Encounter: Payer: Self-pay | Admitting: Internal Medicine

## 2020-11-08 ENCOUNTER — Ambulatory Visit (INDEPENDENT_AMBULATORY_CARE_PROVIDER_SITE_OTHER): Payer: Medicare Other | Admitting: Internal Medicine

## 2020-11-08 ENCOUNTER — Ambulatory Visit
Admission: RE | Admit: 2020-11-08 | Discharge: 2020-11-08 | Disposition: A | Discharge status: Home / Self Care | Payer: Medicare Other | Source: Ambulatory Visit | Attending: Internal Medicine | Admitting: Internal Medicine

## 2020-11-08 VITALS — BP 168/92 | HR 77 | Temp 98.1°F | Ht 63.5 in | Wt 198.0 lb

## 2020-11-08 DIAGNOSIS — R52 Pain, unspecified: Secondary | ICD-10-CM

## 2020-11-08 DIAGNOSIS — I10 Essential (primary) hypertension: Secondary | ICD-10-CM

## 2020-11-08 DIAGNOSIS — H8110 Benign paroxysmal vertigo, unspecified ear: Secondary | ICD-10-CM

## 2020-11-08 DIAGNOSIS — E782 Mixed hyperlipidemia: Secondary | ICD-10-CM

## 2020-11-08 DIAGNOSIS — M79601 Pain in right arm: Secondary | ICD-10-CM

## 2020-11-08 DIAGNOSIS — R030 Elevated blood-pressure reading, without diagnosis of hypertension: Secondary | ICD-10-CM

## 2020-11-08 DIAGNOSIS — Z23 Encounter for immunization: Secondary | ICD-10-CM

## 2020-11-08 MED ORDER — HYDROCODONE-ACETAMINOPHEN 5-325 MG PO TABS: 1.0000 | ORAL_TABLET | Freq: Four times a day (QID) | ORAL | 0 refills | Status: DC | PRN

## 2020-11-08 MED ORDER — LOSARTAN POTASSIUM 50 MG PO TABS: 50.0000 mg | ORAL_TABLET | Freq: Every day | ORAL | 1 refills | Status: DC

## 2020-11-08 MED ORDER — METHYLPREDNISOLONE ACETATE 80 MG/ML IJ SUSP
80.0000 mg | Freq: Once | INTRAMUSCULAR | Status: AC
  Administered 2020-11-08: 80 mg via INTRAMUSCULAR

## 2020-11-08 MED ORDER — MELOXICAM 15 MG PO TABS: 15.0000 mg | ORAL_TABLET | Freq: Every day | ORAL | 0 refills | Status: DC

## 2020-11-08 MED ORDER — MECLIZINE HCL 25 MG PO TABS: 25.0000 mg | ORAL_TABLET | Freq: Three times a day (TID) | ORAL | 1 refills | Status: DC | PRN

## 2020-11-08 NOTE — Progress Notes (Signed)
Subjective:    Patient ID: Sarah [name], female    DOB: May 22, 1950, 70 y.o.   MRN: [id number]  HPI Currently living with daughter in [place] but moving to [facility] in [place] where she will have bedroom [place]. May be more convenient to have Primary Care physician there. Also seen at [facility].  She was seen here in April for health maintenance exam.  She sees psychiatrist daily for depression.  She has a history of left shoulder arthropathy treated in the Fall 2019 with a cortisone injection.  History of adrenal insufficiency followed at  [facility].  History of cervical disc disease and has been treated with Lyrica and gabapentin physiatrist at [facility].  She is on Zocor for hyperlipidemia and takes diuretic for dependent edema.  History of hypokalemia secondary to diuretic therapy.  History of hypertension but it seems she has not been taking antihypertensive medication recently because of changing drugstores due to her change in residence.  She previously was on losartan 50 mg daily.   Her blood pressure was elevated recently when seen in the emergency department in [place] on October 14 for acute right upper extremity pain.  At that time ,she complained of pain in her right forearm which moved to her neck and right upper chest with mild headache.  She reported that briefly she felt like she could not grip well with her right hand but that resolved.  At that time her blood pressure was elevated at 195/90.  EKG showed normal sinus rhythm with no acute changes.  Was noted to have tenderness in right upper chest.  She was treated with hydrocodone APAP which helped a bit.  Still having pain in right elbow extending to right arm.  Denied fall or heavy lifting.  No known trauma to the right upper extremity.  Was told to follow-up here within a week.  Today she is accompanied by her daughter.   Meclizine refill for history of vertigo.  Review of Systems see above complaining of pain  in right elbow and right upper arm     Objective:   Physical Exam Blood pressure 168/92 pulse 77 temperature 98.1 degrees pulse oximetry 99% weight 198 pounds BMI 34.52  Skin: Warm and dry.  Nodes none.  No thyromegaly or adenopathy.  Chest clear.  Cardiac exam regular rate and rhythm.  She has palpable tenderness in her right humerus and both epicondyles of the right elbow.  There is no redness or swelling of the elbow.       Assessment & Plan:  Right bilateral epicondylitis  Right upper extremity pain  Plan: She will have x-ray of right upper arm and right lower arm including epicondyles.  Was given Depo-Medrol 80 mg IM for inflammation/pain with bilateral epicondylitis of the right upper extremity  Was given Rx for  5 tabs of Hydrocodone APAP 5/325 for pain to take sparingly.  May need to see orthopedist regarding this issue.  Was given Mobic 15 mg (#30) 1 tablet by mouth daily.  May need to see orthopedist if not improving.  Regarding elevated blood pressure, it seems that she is currently not on losartan 50 mg daily as we had thought so this was restarted today.  Monitor blood pressure at home after restarting losartan.  It may take 7 to 10 days for BP to come down  Given the fact that she is moving to [facility], she may want to find a primary care physician in that area.  It seems  it will be more convenient to stay in that area particularly since she is also being treated at [facility].  Her medical clinic is a possibility or Physician that  is associated with [facility]

## 2020-11-08 NOTE — Patient Instructions (Signed)
Take Mobic 15 mg daily.  You have been given Depo-Medrol 80 mg IM today for epicondylitis.  Have x-rays of right upper and lower arms today.  Hydrocodone APAP sparingly every 8 hours if needed for severe pain.  Consider seeing orthopedist if not improving.  Regarding Primary care services, since you are leaving to [facility] and is likely to be more convenient for you to be served in the [place] area regarding primary care.  We will be happy to transfer your records to physician of your choice.  We have refilled losartan 50 mg daily for hypertension.  Please keep an eye on your blood pressure and call if not improving.

## 2020-11-11 ENCOUNTER — Ambulatory Visit (INDEPENDENT_AMBULATORY_CARE_PROVIDER_SITE_OTHER): Payer: Medicare Other | Admitting: Behavioral Health

## 2020-11-11 ENCOUNTER — Encounter: Payer: Self-pay | Admitting: Behavioral Health

## 2020-11-11 ENCOUNTER — Other Ambulatory Visit: Payer: Self-pay

## 2020-11-11 VITALS — BP 145/80 | Ht 65.0 in | Wt 199.0 lb

## 2020-11-11 DIAGNOSIS — F411 Generalized anxiety disorder: Secondary | ICD-10-CM | POA: Diagnosis not present

## 2020-11-11 DIAGNOSIS — F99 Mental disorder, not otherwise specified: Secondary | ICD-10-CM

## 2020-11-11 DIAGNOSIS — F3341 Major depressive disorder, recurrent, in partial remission: Secondary | ICD-10-CM | POA: Diagnosis not present

## 2020-11-11 DIAGNOSIS — F5105 Insomnia due to other mental disorder: Secondary | ICD-10-CM

## 2020-11-11 MED ORDER — ESCITALOPRAM OXALATE 10 MG PO TABS: 10.0000 mg | ORAL_TABLET | Freq: Every day | ORAL | 3 refills | Status: DC

## 2020-11-11 MED ORDER — ZOLPIDEM TARTRATE 5 MG PO TABS: 5.0000 mg | ORAL_TABLET | Freq: Every evening | ORAL | 3 refills | Status: DC | PRN

## 2020-11-11 NOTE — Progress Notes (Signed)
[facility] MD/PA/NP Initial Note  11/11/2020 4:26 PM Sarah [name]  MRN:  [id number]  Chief Complaint:  Chief Complaint   Depression; Anxiety; Establish Care; Medication Refill     HPI:  70 year old female presents to this office for initial visit and to establish care. She says that she has been struggling with anxiety and depression since 1998/04/06. Says it became exacerbated after the death of her husband in 2001/04/05. Says she has 2 suicide attempts in the years 2003/04/06 and 04/06/06.  Says that she has not had any SI in years and feels safe at this time. She was receiving treatment for malignant melanoma and mental health care at [facility] until recently when they could not longer see outpatient clients. She was referred by her PCP to find someone who could manage her psychotropic medications. She says that her A&D is stable at this time. She had a previous dx of bipolar but said her physician changed it and does not believe she has bipolar. She is moving in to assisted living facility at [facility] this fall. She reports her anxiety at 3/10 and depression 2/10 at this time. She is sleeping well at 7-8 hours at night. Denies mania, no psychosis. No SI/HI.   No previous medication failures noted this visit.      Visit Diagnosis:    ICD-10-CM   1. Generalized anxiety disorder  F41.1 escitalopram (LEXAPRO) 10 MG tablet    2. Recurrent major depressive disorder, in partial remission (HCC)  F33.41 escitalopram (LEXAPRO) 10 MG tablet    3. 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[place])    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)    PMS (premenstrual syndrome)     Past Surgical History:  Procedure Laterality Date   ABDOMINAL HYSTERECTOMY     BREAST BIOPSY Right    benign   CHOLECYSTECTOMY     COLONOSCOPY  11/08/2018   Danis-polyps   COLONOSCOPY  12/05/2019   Danis   DILATION AND CURETTAGE OF UTERUS     FOOT SURGERY Right Apr 05, 2016   removal of melanoma   LUMBAR FUSION     lymph node removal Right 04/05/2016   groin    POLYPECTOMY     TUBAL LIGATION      Family Psychiatric History: see chart  Family History:  Family History  Problem Relation Age of Onset   Depression Mother    Anxiety disorder Mother    Bipolar disorder Mother    Diabetes Mother    Mental illness Mother    Heart disease Mother    Lung cancer Father    Colon cancer Neg Hx    Liver cancer Neg Hx    Pancreatic cancer Neg Hx    Esophageal  cancer Neg Hx    Stomach cancer Neg Hx    Rectal cancer Neg Hx    Colon polyps Neg Hx    Breast cancer Neg Hx     Social History:  Social History   Socioeconomic History   Marital status: Widowed    Spouse name: deceased   Number of children: 1   Years of education: 15   Highest education level: High school graduate  Occupational History   Occupation: Retired  Tobacco Use   Smoking status: Never   Smokeless tobacco: Never  Vaping Use   Vaping Use: Never used  Substance and Sexual Activity   Alcohol use: Not Currently   Drug use: Never   Sexual activity: Not Currently  Other Topics Concern   Not on file  Social History Narrative   Lives in [place] with daughter but will be moving [place] in [place].    Social Determinants of Health   Financial Resource Strain: Not on file  Food Insecurity: Not on file  Transportation Needs: Not on  file  Physical Activity: Not on file  Stress: Not on file  Social Connections: Not on file    Allergies:  Allergies  Allergen Reactions   Doxycycline Rash   Ciprofloxacin Rash    "very bad rash"   Penicillins Rash    Has patient had a PCN reaction causing immediate rash, facial/tongue/throat swelling, SOB or lightheadedness with hypotension: No Has patient had a PCN reaction causing severe rash involving mucus membranes or skin necrosis: No Has patient had a PCN reaction that required hospitalization: No Has patient had a PCN reaction occurring within the last 10 years: No If all of the above answers are "NO", then may proceed with Cephalosporin use.     Metabolic Disorder Labs: Lab Results  Component Value Date   HGBA1C 6.7 (H) 04/23/2020   MPG 146 04/23/2020   MPG 137 07/31/2019   No results found for: PROLACTIN Lab Results  Component Value Date   CHOL 246 (H) 04/23/2020   TRIG 221 (H) 04/23/2020   HDL 60 04/23/2020   CHOLHDL 4.1 04/23/2020   VLDL 28 07/20/2016   LDLCALC 150 (H) 04/23/2020   LDLCALC 126 (H) 07/31/2019   Lab Results  Component Value Date   TSH 2.26 04/23/2020   TSH 2.42 07/31/2019    Therapeutic Level Labs: Lab Results  Component Value Date   LITHIUM 0.18 (L) 09/16/2010   No results found for: VALPROATE No components found for:  CBMZ  Current Medications: Current Outpatient Medications  Medication Sig Dispense Refill   amitriptyline (ELAVIL) 50 MG tablet Take 50 mg by mouth at bedtime.     Cyanocobalamin (VITAMIN B-12 PO) Take by mouth.     furosemide (LASIX) 40 MG tablet Take 1 tablet (40 mg total) by mouth daily. 90 tablet 1   HYDROcodone-acetaminophen (NORCO/VICODIN) 5-325 MG tablet Take 1 tablet by mouth every 6 (six) hours as needed for severe pain. 5 tablet 0   hydrocortisone (CORTEF) 10 MG tablet 10 mg daily. 15 mg in the am and 5 mg in the pm  3   ibuprofen (ADVIL) 200 MG tablet Take 400 mg by mouth every 6 (six) hours as needed.      losartan (COZAAR) 50 MG tablet Take 1 tablet (50 mg total) by mouth daily. 90 tablet 1   meclizine (ANTIVERT) 25 MG tablet Take 1 tablet (25 mg total) by mouth 3 (three) times daily as needed for dizziness. [place]  tablet 1   meloxicam (MOBIC) 15 MG tablet Take 1 tablet (15 mg total) by mouth daily. 30 tablet 0   pregabalin (LYRICA) 150 MG capsule 150 mg in am and 300 mg in pm     simvastatin (ZOCOR) 10 MG tablet TAKE 1 TABLET BY MOUTH EVERYDAY AT BEDTIME 90 tablet 3   escitalopram (LEXAPRO) 10 MG tablet Take 1 tablet (10 mg total) by mouth daily. 30 tablet 3   zolpidem (AMBIEN) 5 MG tablet Take 1 tablet (5 mg total) by mouth at bedtime as needed for sleep. 30 tablet 3   No current facility-administered medications for this visit.    Medication Side Effects: anxiety  Orders placed this visit:  No orders of the defined types were placed in this encounter.   Psychiatric Specialty Exam:  Review of Systems  HENT:  Positive for congestion and sore throat.   Eyes:  Positive for pain.  Respiratory:  Positive for cough.   Cardiovascular:  Positive for leg swelling.  Musculoskeletal:  Positive for joint swelling.  Neurological:  Positive for dizziness and headaches.  Psychiatric/Behavioral:  Positive for dysphoric mood. The patient is nervous/anxious.    There were no vitals taken for this visit.There is no height or weight on file to calculate BMI.  General Appearance: Casual, Neat, and Well Groomed  Eye Contact:  Good  Speech:  Clear and Coherent  Volume:  Normal  Mood:  NA  Affect:  Appropriate  Thought Process:  Coherent  Orientation:  Full (Time, Place, and Person)  Thought Content: Logical   Suicidal Thoughts:  No  Homicidal Thoughts:  No  Memory:  WNL  Judgement:  Good  Insight:  Good  Psychomotor Activity:  Normal  Concentration:  Concentration: Good  Recall:  Good  Fund of Knowledge: Good  Language: Good  Assets:  Desire for Improvement  ADL's:  Intact  Cognition: WNL   Prognosis:  Good   Screenings:  PHQ2-9    [place] Office Visit from 11/08/2020 in [facility] Office Visit from 04/23/2020 in [facility] MD Office Visit from 01/16/2019 in [facility] MD Office Visit from 01/10/2018 in [facility] MD Office Visit from 10/11/2017 in [facility], MD  PHQ-2 Total Score 1 0 0 4 6  PHQ-9 Total Score 1 0 -- 8 [place] [facility] from 11/01/2020 in [place] No Risk       Receiving Psychotherapy: No   Treatment Plan/Recommendations:   Greater than 50% of 60 min  face to face time with patient was spent on counseling and coordination of care. We discussed long history of anxiety and depression since 1998-05-03. Discussed her husbands death in 05/02/01 and her past multiple suicide attempts. Reviewed her current health issues and explained to Pt about flagged warning of  QT prolongation and risk with Lexapro.  Advised pt to consult with PCP about any history concerning this. Discussed her recent CX and mental health care under [facility]. To continue Lexapro 10 mg daily To continue Zolpidem 5 mg at bedtime To continue Amitriptyline 50 mg at bedtime Will report any worsening symptoms promptly To follow up in 3 months to reassess per pt request Provided emergency contact information Reviewed [place], NP

## 2020-12-05 ENCOUNTER — Other Ambulatory Visit: Payer: Self-pay | Admitting: Internal Medicine

## 2020-12-05 DIAGNOSIS — R52 Pain, unspecified: Secondary | ICD-10-CM

## 2020-12-11 ENCOUNTER — Telehealth: Payer: Self-pay | Admitting: Internal Medicine

## 2020-12-11 DIAGNOSIS — R52 Pain, unspecified: Secondary | ICD-10-CM

## 2020-12-11 MED ORDER — MELOXICAM 15 MG PO TABS: 15.0000 mg | ORAL_TABLET | Freq: Every day | ORAL | 0 refills | Status: DC

## 2020-12-11 NOTE — Telephone Encounter (Signed)
Refilled Meloxicam as requested. 30 days only. She has been given adequate notice to find another PCP.

## 2020-12-11 NOTE — Telephone Encounter (Signed)
Sarah [name]  [phone]  [name] called to see if you would please refill below medication for her mom, they are still working on establishing her a new PCP. Her time is up according to letter. 01/08/2021  meloxicam (MOBIC) 15 MG tablet  [facility] [phone] - [place] Phone:  [phone]  Fax:  [phone]

## 2021-01-09 ENCOUNTER — Other Ambulatory Visit: Payer: Self-pay | Admitting: Internal Medicine

## 2021-01-09 DIAGNOSIS — R52 Pain, unspecified: Secondary | ICD-10-CM

## 2021-01-15 ENCOUNTER — Encounter: Payer: Self-pay | Admitting: Gastroenterology

## 2021-01-27 ENCOUNTER — Encounter: Payer: Self-pay | Admitting: Gastroenterology

## 2021-01-29 ENCOUNTER — Telehealth: Payer: Self-pay | Admitting: *Deleted

## 2021-01-29 NOTE — Telephone Encounter (Signed)
Patient no showed PV today at 830 am -  Called patient at 825 am at [phone] and also [phone]- no answer either number, no voice mail set up to LM on the [phone] number, the 202 number mail box full-  called again 830 am , same numbers, same response, called 835 am, same numbers , same response, called 840 am- both numbers, no answer, not able to LM either number  - sent in a my chart letter about no show PV-  PV and Procedure both canceled- No Show letter  not mailed as mail returned to address we have 01-27-2021.  Sarah [name] PV

## 2021-02-02 DIAGNOSIS — R7309 Other abnormal glucose: Secondary | ICD-10-CM | POA: Insufficient documentation

## 2021-02-02 DIAGNOSIS — F5105 Insomnia due to other mental disorder: Secondary | ICD-10-CM | POA: Insufficient documentation

## 2021-02-02 DIAGNOSIS — M545 Low back pain, unspecified: Secondary | ICD-10-CM | POA: Insufficient documentation

## 2021-02-02 DIAGNOSIS — F99 Mental disorder, not otherwise specified: Secondary | ICD-10-CM | POA: Insufficient documentation

## 2021-02-02 DIAGNOSIS — F411 Generalized anxiety disorder: Secondary | ICD-10-CM | POA: Insufficient documentation

## 2021-02-04 ENCOUNTER — Other Ambulatory Visit: Payer: Self-pay

## 2021-02-04 ENCOUNTER — Encounter: Payer: Self-pay | Admitting: Nurse Practitioner

## 2021-02-04 ENCOUNTER — Ambulatory Visit (INDEPENDENT_AMBULATORY_CARE_PROVIDER_SITE_OTHER): Payer: Medicare Other | Admitting: Nurse Practitioner

## 2021-02-04 ENCOUNTER — Encounter: Payer: Self-pay | Admitting: Gastroenterology

## 2021-02-04 VITALS — BP 120/73 | HR 74 | Temp 98.8°F | Ht 64.75 in | Wt 211.2 lb

## 2021-02-04 DIAGNOSIS — E1159 Type 2 diabetes mellitus with other circulatory complications: Secondary | ICD-10-CM | POA: Diagnosis not present

## 2021-02-04 DIAGNOSIS — Z23 Encounter for immunization: Secondary | ICD-10-CM | POA: Diagnosis not present

## 2021-02-04 DIAGNOSIS — F5105 Insomnia due to other mental disorder: Secondary | ICD-10-CM

## 2021-02-04 DIAGNOSIS — E669 Obesity, unspecified: Secondary | ICD-10-CM

## 2021-02-04 DIAGNOSIS — E559 Vitamin D deficiency, unspecified: Secondary | ICD-10-CM

## 2021-02-04 DIAGNOSIS — G6289 Other specified polyneuropathies: Secondary | ICD-10-CM

## 2021-02-04 DIAGNOSIS — E1169 Type 2 diabetes mellitus with other specified complication: Secondary | ICD-10-CM

## 2021-02-04 DIAGNOSIS — F3341 Major depressive disorder, recurrent, in partial remission: Secondary | ICD-10-CM

## 2021-02-04 DIAGNOSIS — Z7689 Persons encountering health services in other specified circumstances: Secondary | ICD-10-CM

## 2021-02-04 DIAGNOSIS — I152 Hypertension secondary to endocrine disorders: Secondary | ICD-10-CM

## 2021-02-04 DIAGNOSIS — E538 Deficiency of other specified B group vitamins: Secondary | ICD-10-CM

## 2021-02-04 DIAGNOSIS — Z85820 Personal history of malignant melanoma of skin: Secondary | ICD-10-CM

## 2021-02-04 DIAGNOSIS — E785 Hyperlipidemia, unspecified: Secondary | ICD-10-CM

## 2021-02-04 DIAGNOSIS — F411 Generalized anxiety disorder: Secondary | ICD-10-CM

## 2021-02-04 DIAGNOSIS — E271 Primary adrenocortical insufficiency: Secondary | ICD-10-CM | POA: Insufficient documentation

## 2021-02-04 DIAGNOSIS — F99 Mental disorder, not otherwise specified: Secondary | ICD-10-CM

## 2021-02-04 LAB — MICROALBUMIN, URINE WAIVED
Creatinine, Urine Waived: 10 mg/dL (ref 10–300)
Microalb, Ur Waived: 10 mg/L (ref 0–19)

## 2021-02-04 LAB — BAYER DCA HB A1C WAIVED: HB A1C (BAYER DCA - WAIVED): 6.1 % — ABNORMAL HIGH (ref 4.8–5.6)

## 2021-02-04 MED ORDER — SIMVASTATIN 10 MG PO TABS: ORAL_TABLET | ORAL | 4 refills | Status: DC

## 2021-02-04 MED ORDER — FUROSEMIDE 40 MG PO TABS: 40.0000 mg | ORAL_TABLET | Freq: Every day | ORAL | 4 refills | Status: DC

## 2021-02-04 NOTE — Assessment & Plan Note (Signed)
Followed by neurology, recent notes reviewed.  Continue current medication regimen as prescribed by them. 

## 2021-02-04 NOTE — Assessment & Plan Note (Signed)
Chronic, ongoing.  Followed by endocrinology, continue current regimen as ordered by them.  Recent notes reviewed. 

## 2021-02-04 NOTE — Progress Notes (Signed)
New Patient Office Visit  Subjective:  Patient ID: Sarah [name], female    DOB: April 12, 1950  Age: 71 y.o. MRN: [id number]  CC:  Chief Complaint  Patient presents with   Establish Care    Patient is here to establish care. Patient denies having any concerns at today's visit.     HPI Sarah [name] presents for new patient visit to establish care.  Introduced to Designer, jewellery role and practice setting.  All questions answered.  Discussed provider/patient relationship and expectations.  Moved to [place] in November.  Sees [facility] every 6 months for history malignant melanoma in 2018.  Followed by neurology for neuropathy -- last seen 01/05/21. 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  DIABETES Last A1c in April was 6.7%.  Not currently on medication.  Her mother had diabetes, as well as grandmother.   Hypoglycemic episodes: unknown Polydipsia/polyuria: no Visual disturbance: no Chest pain: no Paresthesias:  has neuropathy -- followed  by neurology at [facility], takes Lyrica Glucose Monitoring: no  Accucheck frequency: Not Checking  Fasting glucose:  Post prandial:  Evening:  Before meals: Taking Insulin?: no  Long acting insulin:  Short acting insulin: Blood Pressure Monitoring: daily Retinal Examination: Up to Date -- 2 weeks ago, [facility] Foot Exam: Up to Date Diabetic Education: Not Completed Pneumovax:  provided today Influenza:  provided today Aspirin: no   HYPERTENSION / HYPERLIPIDEMIA Continues on Losartan and Simvastatin + Lasix. Satisfied with current treatment? yes Duration of hypertension: chronic BP monitoring frequency: daily BP range: 137/83 yesterday -- often <130/80 BP medication side effects: no Past BP meds: none Duration of hyperlipidemia: chronic Cholesterol medication side effects:  no Cholesterol supplements: none Past cholesterol medications:  Medication compliance: good compliance Aspirin: no Recent stressors: no Recurrent headaches: no Visual changes: no Palpitations: no Dyspnea: no Chest pain: no Lower extremity edema: yes -- to right foot where history of melanoma was  Dizzy/lightheaded: no   DEPRESSION Sees [facility] every 3 months.  Continues on Lexapro, Ambien, and Amitriptyline.  Does have history of suicide attempts, in 2012 was last after her husband passed away.   Mood status: stable Satisfied with current treatment?: yes Symptom severity: moderate  Duration of current treatment : chronic Side effects: no Medication compliance: good compliance Psychotherapy/counseling: yes in the past Previous psychiatric medications:  Depressed mood: no Anxious mood: no Anhedonia: no Significant weight loss or gain: no Insomnia: yes hard to fall asleep Fatigue: no Feelings of worthlessness or guilt: no Impaired concentration/indecisiveness: no Suicidal ideations: no Hopelessness: no Crying spells: no Depression screen [facility] 2/9 02/04/2021 11/08/2020 04/23/2020 01/16/2019 01/10/2018  Decreased Interest 0 1 0 0 2  Down, Depressed, Hopeless 0 0 0 0 2  PHQ - 2 Score 0 1 0 0 4  Altered sleeping 1 0 0 - 1  Tired, decreased energy 1 0 0 - 1  Change in appetite 2 0 0 - 1  Feeling bad or failure about yourself  0 0 0 - 0  Trouble concentrating 0 0 0 - 0  Moving slowly or fidgety/restless 0 0 0 - 1  Suicidal thoughts 0 0 0 - 0  PHQ-9 Score 4 1 0 - 8  Difficult doing work/chores Not difficult at all Not difficult at all - - -  Some recent data might be hidden     Past Medical History:  Diagnosis Date   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[place])    Adrenal insufficiency ([place])    Anxiety    B12 deficiency    Cancer ([place]) 2018-2019   melanoma   Cervical radiculitis    Constipation    Dependent edema    Depression    Diabetes mellitus    patient denies    Dysmenorrhea    H/O: suicide attempt    History of antineoplastic chemotherapy    History of chemotherapy    Hyperlipidemia    Hypertension    Insomnia    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[place])    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)    PMS (premenstrual syndrome)     Past Surgical History:  Procedure Laterality Date   ABDOMINAL HYSTERECTOMY     BREAST BIOPSY Right    benign   CHOLECYSTECTOMY     COLONOSCOPY  11/08/2018   Danis-polyps   COLONOSCOPY  12/05/2019   Danis   DILATION AND CURETTAGE OF UTERUS     FOOT SURGERY Right 2018   removal of melanoma   LUMBAR FUSION     lymph node removal Right 2018   groin    POLYPECTOMY     TUBAL LIGATION      Family History  Problem Relation Age of Onset   Depression Mother    Anxiety disorder Mother    Bipolar disorder Mother    Diabetes Mother    Mental illness Mother    Heart disease Mother    Stroke Mother    Lung cancer Father    Diabetes Maternal Grandmother    Heart attack Maternal Grandmother    CVA Paternal Grandmother    Colon cancer Neg Hx    Liver cancer Neg Hx    Pancreatic cancer Neg Hx    Esophageal cancer Neg Hx    Stomach cancer Neg Hx    Rectal cancer Neg Hx    Colon polyps Neg Hx    Breast cancer Neg Hx     Social History   Socioeconomic History   Marital status: Widowed    Spouse name: deceased   Number of children: 1   Years of education: 15   Highest education level: High school graduate  Occupational History   Occupation: Retired  Tobacco Use   Smoking status: Never   Smokeless tobacco: Never  Vaping Use   Vaping Use: Never used  Substance and Sexual Activity   Alcohol use: Not Currently   Drug use: Never   Sexual activity: Not Currently  Other Topics Concern   Not on file  Social History Narrative   Daughter lives in [place].  Patient lives [facility] [place] in [place].    Social Determinants of Health   Financial Resource Strain: Low Risk     Difficulty of Paying Living Expenses: Not hard at all  Food Insecurity: No Food Insecurity   Worried About Charity fundraiser in the Last Year: Never true   [place] in the Last Year: Never true  Transportation Needs: No Transportation Needs   Lack of Transportation (Medical): No   Lack of Transportation (Non-Medical): No  Physical Activity: Sufficiently Active   Days of Exercise per Week: 7 days   Minutes of Exercise per Session: 30 min  Stress: No Stress Concern Present   Feeling of Stress : Only a little  Social Connections: Moderately Isolated   Frequency of Communication with Friends and Family: More than three times a week  Frequency of Social Gatherings with Friends and Family: More than three times a week   Attends Religious Services: More than 4 times per year   Active Member of Clubs or Organizations: No   Attends Archivist Meetings: Never   Marital Status: Widowed  Human resources officer Violence: Not At Risk   Fear of Current or Ex-Partner: No   Emotionally Abused: No   Physically Abused: No   Sexually Abused: No    ROS Review of Systems  Constitutional:  Negative for activity change, appetite change, diaphoresis, fatigue and fever.  Respiratory:  Negative for cough, chest tightness and shortness of breath.   Cardiovascular:  Negative for chest pain, palpitations and leg swelling.  Gastrointestinal: Negative.   Endocrine: Negative for cold intolerance, heat intolerance, polydipsia, polyphagia and polyuria.  Neurological:  Positive for numbness. Negative for dizziness, syncope, weakness, light-headedness and headaches.  Psychiatric/Behavioral:  Positive for sleep disturbance. Negative for decreased concentration, self-injury and suicidal ideas. The patient is not nervous/anxious.    Objective:   Today's Vitals: BP 120/73    Pulse 74    Temp 98.8 F (37.1 C) (Oral)    Ht 5' 4.75" (1.645 m)    Wt 211 lb 3.2 oz (95.8 kg)    SpO2 96%    BMI 35.42 kg/m    Physical Exam Vitals and nursing note reviewed.  Constitutional:      General: She is awake. She is not in acute distress.    Appearance: She is well-developed and well-groomed. She is obese. She is not ill-appearing or toxic-appearing.  HENT:     Head: Normocephalic.     Right Ear: Hearing normal.     Left Ear: Hearing normal.  Eyes:     General: Lids are normal.        Right eye: No discharge.        Left eye: No discharge.     Conjunctiva/sclera: Conjunctivae normal.     Pupils: Pupils are equal, round, and reactive to light.  Neck:     Thyroid: No thyromegaly.     Vascular: No carotid bruit.  Cardiovascular:     Rate and Rhythm: Normal rate and regular rhythm.     Heart sounds: Normal heart sounds. No murmur heard.   No gallop.  Pulmonary:     Effort: Pulmonary effort is normal. No accessory muscle usage or respiratory distress.     Breath sounds: Normal breath sounds.  Abdominal:     General: Bowel sounds are normal.     Palpations: Abdomen is soft.  Musculoskeletal:     Cervical back: Normal range of motion and neck supple.     Right lower leg: No edema.     Left lower leg: No edema.  Lymphadenopathy:     Cervical: No cervical adenopathy.  Skin:    General: Skin is warm and dry.  Neurological:     Mental Status: She is alert and oriented to person, place, and time.     Deep Tendon Reflexes:     Reflex Scores:      Brachioradialis reflexes are 1+ on the right side and 1+ on the left side.      Patellar reflexes are 1+ on the right side and 1+ on the left side. Psychiatric:        Attention and Perception: Attention normal.        Mood and Affect: Mood normal.        Speech: Speech normal.  Behavior: Behavior normal. Behavior is cooperative.        Thought Content: Thought content normal.   Diabetic Foot Exam - Simple   Simple Foot Form Visual Inspection See comments: Yes Sensation Testing See comments: Yes Pulse Check Posterior Tibialis and  Dorsalis pulse intact bilaterally: Yes Comments 2+ pulses DP and PT bilaterally.  Decreased sensation bilateral feet R>L.  3 cm callus present to area of previous melanoma upper right foot, pedal aspect under great toe.      Assessment & Plan:   Problem List Items Addressed This Visit       Cardiovascular and Mediastinum   Hypertension associated with diabetes ([place])    Chronic, ongoing.  BP at goal today.  Recommend she monitor BP at least a few mornings a week at home and document.  DASH diet at home.  Continue current medication regimen and adjust as needed.  Labs today: CBC, CMP, TSH, urine ALB.  Refills as needed.  Losartan for kidney protection.       Relevant Medications   simvastatin (ZOCOR) 10 MG tablet   furosemide (LASIX) 40 MG tablet   Other Relevant Orders   Bayer DCA Hb A1c Waived (Completed)   Microalbumin, Urine Waived (Completed)   CBC with Differential/Platelet   Comprehensive metabolic panel   TSH     Endocrine   Adrenal insufficiency (Addison's disease) (HCC)    Chronic, ongoing.  Followed by endocrinology, continue current regimen as ordered by them.  Recent notes reviewed.      Hyperlipidemia associated with type 2 diabetes mellitus (HCC)    Chronic, ongoing.  Continue Simvastatin and adjust as needed.  Lipid panel and CMP today.      Relevant Medications   simvastatin (ZOCOR) 10 MG tablet   furosemide (LASIX) 40 MG tablet   Other Relevant Orders   Bayer DCA Hb A1c Waived (Completed)   Comprehensive metabolic panel   Lipid Panel w/o Chol/HDL Ratio   Type 2 diabetes mellitus with obesity (HCC) - Primary    Chronic, stable.  A1c today trending down to 6.1%, urine ALB obtained.  At this time recommend focus on diet and regular activity.  Monitor A1c closely and initiate medication as needed.  Will provide information on diabetic diet next visit.      Relevant Medications   simvastatin (ZOCOR) 10 MG tablet   Other Relevant Orders   Bayer DCA Hb A1c  Waived (Completed)   Microalbumin, Urine Waived (Completed)     Nervous and Auditory   Axonal sensorimotor neuropathy    Followed by neurology, recent notes reviewed.  Continue current medication regimen as prescribed by them.        Other   B12 deficiency    History of lower levels, check today and continue supplement.      Relevant Orders   Vitamin B12   RESOLVED: GAD (generalized anxiety disorder)   History of malignant melanoma    Followed by dermatology - continue this collaboration, as well as oncology collaboration.      Insomnia due to other mental disorder    Chronic, ongoing.  Followed by psychiatry who fills Ambien, continue current medication regimen as prescribed by them.      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HCC)    Chronic, stable.  Denies SI/HI.  Followed by psychiatry -- recent notes reviewed and will maintain current medication regimen as prescribed by them.      Other Visit Diagnoses     Vitamin D deficiency  History of low levels, check today and initiate supplement as needed.   Relevant Orders   VITAMIN D 25 Hydroxy (Vit-D Deficiency, Fractures)   Flu vaccine need       Flu vaccine provided today.   Relevant Orders   Pneumococcal polysaccharide vaccine 23-valent greater than or equal to 2yo subcutaneous/IM (Completed)   Pneumococcal vaccination given       PPSV23 provided today.   Relevant Orders   Flu Vaccine QUAD High Dose(Fluad) (Completed)   Encounter to establish care       Established care with office and introduced to practice.       Outpatient Encounter Medications as of 02/04/2021  Medication Sig   amitriptyline (ELAVIL) 50 MG tablet Take 50 mg by mouth at bedtime.   Cyanocobalamin (VITAMIN B-12 PO) Take by mouth.   escitalopram (LEXAPRO) 10 MG tablet Take 1 tablet (10 mg total) by mouth daily.   HYDROcodone-acetaminophen (NORCO/VICODIN) 5-325 MG tablet Take 1 tablet by mouth every 6 (six) hours as needed for  severe pain.   hydrocortisone (CORTEF) 10 MG tablet 10 mg daily. 15 mg in the am and 5 mg in the pm   losartan (COZAAR) 50 MG tablet Take 1 tablet (50 mg total) by mouth daily.   meclizine (ANTIVERT) 25 MG tablet Take 1 tablet (25 mg total) by mouth 3 (three) times daily as needed for dizziness.   meloxicam (MOBIC) 15 MG tablet TAKE 1 TABLET(15 MG) BY MOUTH DAILY   pregabalin (LYRICA) 150 MG capsule 150 mg in am and 300 mg in pm   zolpidem (AMBIEN) 5 MG tablet Take 1 tablet (5 mg total) by mouth at bedtime as needed for sleep.   [DISCONTINUED] amitriptyline (ELAVIL) 50 MG tablet Take 1 tablet by mouth at bedtime.   [DISCONTINUED] furosemide (LASIX) 40 MG tablet Take 1 tablet (40 mg total) by mouth daily.   [DISCONTINUED] simvastatin (ZOCOR) 10 MG tablet TAKE 1 TABLET BY MOUTH EVERYDAY AT BEDTIME   furosemide (LASIX) 40 MG tablet Take 1 tablet (40 mg total) by mouth daily.   simvastatin (ZOCOR) 10 MG tablet TAKE 1 TABLET BY MOUTH EVERYDAY AT BEDTIME   [DISCONTINUED] hydrocortisone (CORTEF) 5 MG tablet Take 10 mg by mouth 2 (two) times daily.   [DISCONTINUED] ibuprofen (ADVIL) 200 MG tablet Take 400 mg by mouth every 6 (six) hours as needed. (Patient not taking: Reported on 02/04/2021)   [DISCONTINUED] losartan (COZAAR) 25 MG tablet Take by mouth. (Patient not taking: Reported on 02/04/2021)   No facility-administered encounter medications on file as of 02/04/2021.    Follow-up: Return in about 3 months (around 04/24/2021) for Annual physical.   Venita Lick, NP

## 2021-02-04 NOTE — Assessment & Plan Note (Signed)
Followed by dermatology - continue this collaboration, as well as oncology collaboration. 

## 2021-02-04 NOTE — Assessment & Plan Note (Signed)
Chronic, ongoing.  BP at goal today.  Recommend she monitor BP at least a few mornings a week at home and document.  DASH diet at home.  Continue current medication regimen and adjust as needed.  Labs today: CBC, CMP, TSH, urine ALB.  Refills as needed.  Losartan for kidney protection.

## 2021-02-04 NOTE — Assessment & Plan Note (Signed)
Chronic, stable.  A1c today trending down to 6.1%, urine ALB obtained.  At this time recommend focus on diet and regular activity.  Monitor A1c closely and initiate medication as needed.  Will provide information on diabetic diet next visit.

## 2021-02-04 NOTE — Assessment & Plan Note (Signed)
Chronic, ongoing.  Continue Simvastatin and adjust as needed.  Lipid panel and CMP today.

## 2021-02-04 NOTE — Assessment & Plan Note (Signed)
Chronic, ongoing.  Followed by psychiatry who fills Ambien, continue current medication regimen as prescribed by them.

## 2021-02-04 NOTE — Patient Instructions (Signed)
Prediabetes Eating Plan °Prediabetes is a condition that causes blood sugar (glucose) levels to be higher than normal. This increases the risk for developing type 2 diabetes (type 2 diabetes mellitus). Working with a health care provider or nutrition specialist (dietitian) to make diet and lifestyle changes can help prevent the onset of diabetes. These changes may help you: °Control your blood glucose levels. °Improve your cholesterol levels. °Manage your blood pressure. °What are tips for following this plan? °Reading food labels °Read food labels to check the amount of fat, salt (sodium), and sugar in prepackaged foods. Avoid foods that have: °Saturated fats. °Trans fats. °Added sugars. °Avoid foods that have more than 300 milligrams (mg) of sodium per serving. Limit your sodium intake to less than 2,300 mg each day. °Shopping °Avoid buying pre-made and processed foods. °Avoid buying drinks with added sugar. °Cooking °Cook with olive oil. Do not use butter, lard, or ghee. °Bake, broil, grill, steam, or boil foods. Avoid frying. °Meal planning ° °Work with your dietitian to create an eating plan that is right for you. This may include tracking how many calories you take in each day. Use a food diary, notebook, or mobile application to track what you eat at each meal. °Consider following a Mediterranean diet. This includes: °Eating several servings of fresh fruits and vegetables each day. °Eating fish at least twice a week. °Eating one serving each day of whole grains, beans, nuts, and seeds. °Using olive oil instead of other fats. °Limiting alcohol. °Limiting red meat. °Using nonfat or low-fat dairy products. °Consider following a plant-based diet. This includes dietary choices that focus on eating mostly vegetables and fruit, grains, beans, nuts, and seeds. °If you have high blood pressure, you may need to limit your sodium intake or follow a diet such as the DASH (Dietary Approaches to Stop Hypertension) eating  plan. The DASH diet aims to lower high blood pressure. °Lifestyle °Set weight loss goals with help from your health care team. It is recommended that most people with prediabetes lose 7% of their body weight. °Exercise for at least 30 minutes 5 or more days a week. °Attend a support group or seek support from a mental health counselor. °Take over-the-counter and prescription medicines only as told by your health care provider. °What foods are recommended? °Fruits °Berries. Bananas. Apples. Oranges. Grapes. Papaya. Mango. Pomegranate. Kiwi. Grapefruit. Cherries. °Vegetables °Lettuce. Spinach. Peas. Beets. Cauliflower. Cabbage. Broccoli. Carrots. Tomatoes. Squash. Eggplant. Herbs. Peppers. Onions. Cucumbers. Brussels sprouts. °Grains °Whole grains, such as whole-wheat or whole-grain breads, crackers, cereals, and pasta. Unsweetened oatmeal. Bulgur. Barley. Quinoa. Brown rice. Corn or whole-wheat flour tortillas or taco shells. °Meats and other proteins °Seafood. Poultry without skin. Lean cuts of pork and beef. Tofu. Eggs. Nuts. Beans. °Dairy °Low-fat or fat-free dairy products, such as yogurt, cottage cheese, and cheese. °Beverages °Water. Tea. Coffee. Sugar-free or diet soda. Seltzer water. Low-fat or nonfat milk. Milk alternatives, such as soy or almond milk. °Fats and oils °Olive oil. Canola oil. Sunflower oil. Grapeseed oil. Avocado. Walnuts. °Sweets and desserts °Sugar-free or low-fat pudding. Sugar-free or low-fat ice cream and other frozen treats. °Seasonings and condiments °Herbs. Sodium-free spices. Mustard. Relish. Low-salt, low-sugar ketchup. Low-salt, low-sugar barbecue sauce. Low-fat or fat-free mayonnaise. °The items listed above may not be a complete list of recommended foods and beverages. Contact a dietitian for more information. °What foods are not recommended? °Fruits °Fruits canned with syrup. °Vegetables °Canned vegetables. Frozen vegetables with butter or cream sauce. °Grains °Refined white  flour and flour   products, such as bread, pasta, snack foods, and cereals. °Meats and other proteins °Fatty cuts of meat. Poultry with skin. Breaded or fried meat. Processed meats. °Dairy °Full-fat yogurt, cheese, or milk. °Beverages °Sweetened drinks, such as iced tea and soda. °Fats and oils °Butter. Lard. Ghee. °Sweets and desserts °Baked goods, such as cake, cupcakes, pastries, cookies, and cheesecake. °Seasonings and condiments °Spice mixes with added salt. Ketchup. Barbecue sauce. Mayonnaise. °The items listed above may not be a complete list of foods and beverages that are not recommended. Contact a dietitian for more information. °Where to find more information °American Diabetes Association: www.diabetes.org °Summary °You may need to make diet and lifestyle changes to help prevent the onset of diabetes. These changes can help you control blood sugar, improve cholesterol levels, and manage blood pressure. °Set weight loss goals with help from your health care team. It is recommended that most people with prediabetes lose 7% of their body weight. °Consider following a Mediterranean diet. This includes eating plenty of fresh fruits and vegetables, whole grains, beans, nuts, seeds, fish, and low-fat dairy, and using olive oil instead of other fats. °This information is not intended to replace advice given to you by your health care provider. Make sure you discuss any questions you have with your health care provider. °Document Revised: 04/06/2019 Document Reviewed: 04/06/2019 °Elsevier Patient Education © 2022 [employer] Inc. ° °

## 2021-02-04 NOTE — Assessment & Plan Note (Signed)
Chronic, stable.  Denies SI/HI.  Followed by psychiatry -- recent notes reviewed and will maintain current medication regimen as prescribed by them. 

## 2021-02-04 NOTE — Assessment & Plan Note (Signed)
History of lower levels, check today and continue supplement.

## 2021-02-05 ENCOUNTER — Encounter: Payer: Self-pay | Admitting: Nurse Practitioner

## 2021-02-05 DIAGNOSIS — E559 Vitamin D deficiency, unspecified: Secondary | ICD-10-CM | POA: Insufficient documentation

## 2021-02-05 LAB — COMPREHENSIVE METABOLIC PANEL
ALT: 15 IU/L (ref 0–32)
AST: 12 IU/L (ref 0–40)
Albumin/Globulin Ratio: 2.1 (ref 1.2–2.2)
Albumin: 4.4 g/dL (ref 3.8–4.8)
Alkaline Phosphatase: 93 IU/L (ref 44–121)
BUN/Creatinine Ratio: 22 (ref 12–28)
BUN: 17 mg/dL (ref 8–27)
Bilirubin Total: 0.4 mg/dL (ref 0.0–1.2)
CO2: 21 mmol/L (ref 20–29)
Calcium: 8.8 mg/dL (ref 8.7–10.3)
Chloride: 101 mmol/L (ref 96–106)
Creatinine, Ser: 0.79 mg/dL (ref 0.57–1.00)
Globulin, Total: 2.1 g/dL (ref 1.5–4.5)
Glucose: 108 mg/dL — ABNORMAL HIGH (ref 70–99)
Potassium: 4.3 mmol/L (ref 3.5–5.2)
Sodium: 140 mmol/L (ref 134–144)
Total Protein: 6.5 g/dL (ref 6.0–8.5)
eGFR: 81 mL/min/{1.73_m2} (ref >59)

## 2021-02-05 LAB — CBC WITH DIFFERENTIAL/PLATELET
Basophils Absolute: 0.1 10*3/uL (ref 0.0–0.2)
Basos: 1 %
EOS (ABSOLUTE): 0.3 10*3/uL (ref 0.0–0.4)
Eos: 3 %
Hematocrit: 38.9 % (ref 34.0–46.6)
Hemoglobin: 13.1 g/dL (ref 11.1–15.9)
Immature Grans (Abs): 0.1 10*3/uL (ref 0.0–0.1)
Immature Granulocytes: 1 %
Lymphocytes Absolute: 1.5 10*3/uL (ref 0.7–3.1)
Lymphs: 18 %
MCH: 29.6 pg (ref 26.6–33.0)
MCHC: 33.7 g/dL (ref 31.5–35.7)
MCV: 88 fL (ref 79–97)
Monocytes Absolute: 0.4 10*3/uL (ref 0.1–0.9)
Monocytes: 5 %
Neutrophils Absolute: 6.1 10*3/uL (ref 1.4–7.0)
Neutrophils: 72 %
Platelets: 208 10*3/uL (ref 150–450)
RBC: 4.42 x10E6/uL (ref 3.77–5.28)
RDW: 12.6 % (ref 11.7–15.4)
WBC: 8.4 10*3/uL (ref 3.4–10.8)

## 2021-02-05 LAB — LIPID PANEL W/O CHOL/HDL RATIO
Cholesterol, Total: 212 mg/dL — ABNORMAL HIGH (ref 100–199)
HDL: 60 mg/dL (ref >39)
LDL Chol Calc (NIH): 131 mg/dL — ABNORMAL HIGH (ref 0–99)
Triglycerides: 121 mg/dL (ref 0–149)
VLDL Cholesterol Cal: 21 mg/dL (ref 5–40)

## 2021-02-05 LAB — TSH: TSH: 1.41 u[IU]/mL (ref 0.450–4.500)

## 2021-02-05 LAB — VITAMIN D 25 HYDROXY (VIT D DEFICIENCY, FRACTURES): Vit D, 25-Hydroxy: 19.2 ng/mL — ABNORMAL LOW (ref 30.0–100.0)

## 2021-02-05 LAB — VITAMIN B12: Vitamin B-12: 1872 pg/mL — ABNORMAL HIGH (ref 232–1245)

## 2021-02-05 NOTE — Progress Notes (Signed)
Contacted via MyChart   Good evening Bretta, your labs have returned: - Kidney function, creatinine and eGFR, is normal.  Liver function, AST and ALT, also normal. - Cholesterol levels are above goal -- are you taking Simvastatin consistently?  Were you fasting?  Let me know.  We may need to try another cholesterol medication and stop Simvastatin to get levels to goal. - Vitamin B12 level stable.  I would recommend changing this to every other day dosing. - Vitamin D level is on lower side -- please start taking Vitamin D3 2000 units daily for overall bone and muscle health. - CBC shows no anemia or infection & thyroid lab is normal.  Any questions? Keep being amazing!!  Thank you for allowing me to participate in your care.  I appreciate you. Kindest regards, Sebastian Dzik

## 2021-02-06 ENCOUNTER — Other Ambulatory Visit: Payer: Self-pay | Admitting: Internal Medicine

## 2021-02-06 DIAGNOSIS — R52 Pain, unspecified: Secondary | ICD-10-CM

## 2021-02-07 ENCOUNTER — Encounter: Payer: Self-pay | Admitting: Nurse Practitioner

## 2021-02-11 ENCOUNTER — Ambulatory Visit (INDEPENDENT_AMBULATORY_CARE_PROVIDER_SITE_OTHER): Payer: Medicare Other | Admitting: Behavioral Health

## 2021-02-11 ENCOUNTER — Other Ambulatory Visit: Payer: Self-pay

## 2021-02-11 ENCOUNTER — Encounter: Payer: Self-pay | Admitting: Behavioral Health

## 2021-02-11 DIAGNOSIS — F99 Mental disorder, not otherwise specified: Secondary | ICD-10-CM

## 2021-02-11 DIAGNOSIS — F411 Generalized anxiety disorder: Secondary | ICD-10-CM

## 2021-02-11 DIAGNOSIS — F5105 Insomnia due to other mental disorder: Secondary | ICD-10-CM

## 2021-02-11 DIAGNOSIS — F3341 Major depressive disorder, recurrent, in partial remission: Secondary | ICD-10-CM

## 2021-02-11 MED ORDER — ESCITALOPRAM OXALATE 10 MG PO TABS: 10.0000 mg | ORAL_TABLET | Freq: Every day | ORAL | 3 refills | Status: DC

## 2021-02-11 MED ORDER — ZOLPIDEM TARTRATE 5 MG PO TABS: 5.0000 mg | ORAL_TABLET | Freq: Every evening | ORAL | 3 refills | Status: DC | PRN

## 2021-02-11 NOTE — Progress Notes (Signed)
[facility]  Patient ID: Sarah [name],  MRN: [id number]  PCP: Venita Lick, NP  Date of Evaluation: 02/11/2021 Time spent:30 minutes  Chief Complaint:  Chief Complaint   Anxiety; Depression; Follow-up; Medication Refill     HISTORY/CURRENT STATUS: HPI  71 year old female presents to this office for follow up and medication management. She says that she continues to feel stable with Lexapro and does not need any changes to her medications at this time.  Says that she has not had any SI in years and feels safe at this time. She was receiving treatment for malignant melanoma and mental health care at [facility] until recently when they could not longer see outpatient clients. She is still planning on moving to assisted living/ retirement facility [facility]. She reports her anxiety at 1/10 and depression 1/10. She is sleeping well at 7-8 hours at night. Denies mania, no psychosis. No SI/HI.    No previous medication failures noted this visit.         Individual Medical History/ Review of Systems: Changes? :No   Allergies: Doxycycline, Ciprofloxacin, and Penicillins  Current Medications:  Current Outpatient Medications:    amitriptyline (ELAVIL) 50 MG tablet, Take 50 mg by mouth at bedtime., Disp: , Rfl:    Cyanocobalamin (VITAMIN B-12 PO), Take by mouth., Disp: , Rfl:    escitalopram (LEXAPRO) 10 MG tablet, Take 1 tablet (10 mg total) by mouth daily., Disp: 30 tablet, Rfl: 3   furosemide (LASIX) 40 MG tablet, Take 1 tablet (40 mg total) by mouth daily., Disp: 90 tablet, Rfl: 4   HYDROcodone-acetaminophen (NORCO/VICODIN) 5-325 MG tablet, Take 1 tablet by mouth every 6 (six) hours as needed for severe pain., Disp: 5 tablet, Rfl: 0   hydrocortisone (CORTEF) 10 MG tablet, 10 mg daily. 15 mg in the am and 5 mg in the pm, Disp: , Rfl: 3   losartan (COZAAR) 50 MG tablet, Take 1 tablet (50 mg total) by mouth daily., Disp: 90 tablet, Rfl: 1   meclizine (ANTIVERT) 25 MG  tablet, Take 1 tablet (25 mg total) by mouth 3 (three) times daily as needed for dizziness., Disp: 30 tablet, Rfl: 1   meloxicam (MOBIC) 15 MG tablet, TAKE 1 TABLET(15 MG) BY MOUTH DAILY, Disp: 30 tablet, Rfl: 0   pregabalin (LYRICA) 150 MG capsule, 150 mg in am and 300 mg in pm, Disp: , Rfl:    simvastatin (ZOCOR) 10 MG tablet, TAKE 1 TABLET BY MOUTH EVERYDAY AT BEDTIME, Disp: 90 tablet, Rfl: 4   zolpidem (AMBIEN) 5 MG tablet, Take 1 tablet (5 mg total) by mouth at bedtime as needed for sleep., Disp: 30 tablet, Rfl: 3 Medication Side Effects: none  Family Medical/ Social History: Changes? No  MENTAL HEALTH EXAM:  There were no vitals taken for this visit.There is no height or weight on file to calculate BMI.  General Appearance: Casual, Neat, and Well Groomed  Eye Contact:  Good  Speech:  Clear and Coherent  Volume:  Normal  Mood:  NA  Affect:  Appropriate  Thought Process:  Coherent  Orientation:  Full (Time, Place, and Person)  Thought Content: Logical   Suicidal Thoughts:  No  Homicidal Thoughts:  No  Memory:  WNL  Judgement:  Good  Insight:  Good  Psychomotor Activity:  Normal  Concentration:  Concentration: Good  Recall:  Good  Fund of Knowledge: Good  Language: Good  Assets:  Desire for Improvement  ADL's:  Intact  Cognition: WNL  Prognosis:  Good    DIAGNOSES:    ICD-10-CM   1. Generalized anxiety disorder  F41.1 escitalopram (LEXAPRO) 10 MG tablet    2. Recurrent major depressive disorder, in partial remission (HCC)  F33.41 escitalopram (LEXAPRO) 10 MG tablet    3. Insomnia due to other mental disorder  F51.05 zolpidem (AMBIEN) 5 MG tablet   F99       Receiving Psychotherapy: No    RECOMMENDATIONS:   Greater than 50% of 30 min  face to face time with patient was spent on counseling and coordination of care. We discussed her current stability with her current medications. She is reporting very low levels of anxiety and depression with Lexapro. No changes  are indicated at this time. Reinforced documented previous and current flagged warning of  QT prolongation and risk with Lexapro. She has been on the medication as reported by Pt since 2018.   Advised  her again to also discuss  with PCP about any history concerning this. Discussed her recent CX and mental health care under [facility]. To continue Lexapro 10 mg daily To continue Zolpidem 5 mg at bedtime To continue Amitriptyline 50 mg at bedtime Will report any worsening symptoms promptly To follow up in 3 months to reassess per pt request Provided emergency contact information Reviewed [place], NP

## 2021-02-13 ENCOUNTER — Telehealth: Payer: Self-pay

## 2021-02-13 NOTE — Telephone Encounter (Signed)
Prior Authorization submitted and approved for ZOLPIDEM 5 MG #30 effective [id number] 01/18/2022.

## 2021-02-20 ENCOUNTER — Encounter: Payer: Medicare Other | Admitting: Gastroenterology

## 2021-03-05 ENCOUNTER — Ambulatory Visit (AMBULATORY_SURGERY_CENTER): Payer: Medicare Other | Admitting: *Deleted

## 2021-03-05 ENCOUNTER — Other Ambulatory Visit: Payer: Self-pay

## 2021-03-05 VITALS — Ht 64.75 in | Wt 201.0 lb

## 2021-03-05 DIAGNOSIS — Z86010 Personal history of colonic polyps: Secondary | ICD-10-CM

## 2021-03-05 MED ORDER — PEG 3350-KCL-NA BICARB-NACL 420 G PO SOLR
4000.0000 mL | Freq: Once | ORAL | 0 refills | Status: AC
Start: 2021-03-05 — End: 2021-03-05

## 2021-03-05 NOTE — Progress Notes (Signed)
No egg or soy allergy known to patient  ?No issues known to pt with past sedation with any surgeries or procedures ?Patient denies ever being told they had issues or difficulty with intubation  ?No FH of Malignant Hyperthermia ?Pt is not on diet pills ?Pt is not on  home 02  ?Pt is not on blood thinners  ?Pt denies issues with constipation  ?No A fib or A flutter ? ?Pt is  vaccinated  for Covid  ? ?Due to the COVID-19 pandemic we are asking patients to follow certain guidelines in PV and the [facility]   ?Pt aware of COVID protocols and [facility] guidelines  ? ?PV completed over the phone. Pt verified name, DOB, address and insurance during PV today.  ?Pt mailed instruction packet with copy of consent form to read and not return, and instructions.  ?Pt encouraged to call with questions or issues.  ?If pt has My chart, procedure instructions sent via My Chart   ?

## 2021-03-14 ENCOUNTER — Other Ambulatory Visit: Payer: Self-pay | Admitting: Internal Medicine

## 2021-03-14 DIAGNOSIS — I10 Essential (primary) hypertension: Secondary | ICD-10-CM

## 2021-03-17 ENCOUNTER — Encounter: Payer: Self-pay | Admitting: Gastroenterology

## 2021-03-18 ENCOUNTER — Encounter: Payer: Self-pay | Admitting: Certified Registered Nurse Anesthetist

## 2021-03-19 ENCOUNTER — Ambulatory Visit (AMBULATORY_SURGERY_CENTER): Payer: Medicare Other | Admitting: Gastroenterology

## 2021-03-19 ENCOUNTER — Encounter: Payer: Self-pay | Admitting: Gastroenterology

## 2021-03-19 ENCOUNTER — Other Ambulatory Visit: Payer: Self-pay

## 2021-03-19 VITALS — BP 127/76 | HR 69 | Temp 97.3°F | Resp 16 | Ht 64.0 in | Wt 201.0 lb

## 2021-03-19 DIAGNOSIS — K635 Polyp of colon: Secondary | ICD-10-CM

## 2021-03-19 DIAGNOSIS — D123 Benign neoplasm of transverse colon: Secondary | ICD-10-CM

## 2021-03-19 DIAGNOSIS — Z86010 Personal history of colonic polyps: Secondary | ICD-10-CM

## 2021-03-19 NOTE — Patient Instructions (Signed)
? ?Handouts on polyps & diverticulosis given to you today ? ?Await pathology results on polyp removed  ? ? ?YOU HAD AN ENDOSCOPIC PROCEDURE TODAY AT [facility]:   Refer to the procedure report that was given to you for any specific questions about what was found during the examination.  If the procedure report does not answer your questions, please call your gastroenterologist to clarify.  If you requested that your care partner not be given the details of your procedure findings, then the procedure report has been included in a sealed envelope for you to review at your convenience later. ? ?YOU SHOULD EXPECT: Some feelings of bloating in the abdomen. Passage of more gas than usual.  Walking can help get rid of the air that was put into your GI tract during the procedure and reduce the bloating. If you had a lower endoscopy (such as a colonoscopy or flexible sigmoidoscopy) you may notice spotting of blood in your stool or on the toilet paper. If you underwent a bowel prep for your procedure, you may not have a normal bowel movement for a few days. ? ?Please Note:  You might notice some irritation and congestion in your nose or some drainage.  This is from the oxygen used during your procedure.  There is no need for concern and it should clear up in a day or so. ? ?SYMPTOMS TO REPORT IMMEDIATELY: ? ?Following lower endoscopy (colonoscopy or flexible sigmoidoscopy): ? Excessive amounts of blood in the stool ? Significant tenderness or worsening of abdominal pains ? Swelling of the abdomen that is new, acute ? Fever of 100?F or higher ? ? ?For urgent or emergent issues, a gastroenterologist can be reached at any hour by calling [phone]. ?Do not use MyChart messaging for urgent concerns.  ? ? ?DIET:  We do recommend a small meal at first, but then you may proceed to your regular diet.  Drink plenty of fluids but you should avoid alcoholic beverages for 24 hours. ? ?ACTIVITY:  You should plan to  take it easy for the rest of today and you should NOT DRIVE or use heavy machinery until tomorrow (because of the sedation medicines used during the test).   ? ?FOLLOW UP: ?Our staff will call the number listed on your records 48-72 hours following your procedure to check on you and address any questions or concerns that you may have regarding the information given to you following your procedure. If we do not reach you, we will leave a message.  We will attempt to reach you two times.  During this call, we will ask if you have developed any symptoms of COVID 19. If you develop any symptoms (ie: fever, flu-like symptoms, shortness of breath, cough etc.) before then, please call [phone].  If you test positive for Covid 19 in the 2 weeks post procedure, please call and report this information to [place].   ? ?If any biopsies were taken you will be contacted by phone or by letter within the next 1-3 weeks.  Please call us at [phone] if you have not heard about the biopsies in 3 weeks.  ? ? ?SIGNATURES/CONFIDENTIALITY: ?You and/or your care partner have signed paperwork which will be entered into your electronic medical record.  These signatures attest to the fact that that the information above on your After Visit Summary has been reviewed and is understood.  Full responsibility of the confidentiality of this discharge information lies with you and/or your  care-partner.  ?

## 2021-03-19 NOTE — Progress Notes (Signed)
Report given to PACU, vss 

## 2021-03-19 NOTE — Progress Notes (Signed)
Called to room to assist during endoscopic procedure.  Patient ID and intended procedure confirmed with present staff. Received instructions for my participation in the procedure from the performing physician.  

## 2021-03-19 NOTE — Progress Notes (Signed)
Courtney vitals ?

## 2021-03-19 NOTE — Progress Notes (Signed)
History and Physical: ? This patient presents for endoscopic testing for: ?Encounter Diagnosis  ?Name Primary?  ? Personal history of colonic polyps Yes  ? ? ?Multiple polyps 11/2019 and 10/2018 ?Patient denies chronic abdominal pain, rectal bleeding, constipation or diarrhea. ? ?ROS: ?Patient denies chest pain or shortness of breath ? ? ?Past Medical History: ?Past Medical History:  ?Diagnosis Date  ? 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 Adrenal insufficiency ([place])   ? Anxiety   ? B12 deficiency   ? Cancer [facility]) 2018-2019  ? melanoma  ? Cervical radiculitis   ? Constipation   ? Dependent edema   ? Depression   ? Diabetes mellitus   ? PRE,NO MEDICATIONS AS OF 03/05/21  ? Dysmenorrhea   ? H/O: suicide attempt   ? History of antineoplastic chemotherapy   ? History of chemotherapy   ? Hyperlipidemia   ? Hypertension   ? Insomnia   ? 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 PMS (premenstrual syndrome)   ? ? ? ?Past Surgical History: ?Past Surgical History:  ?Procedure Laterality Date  ? ABDOMINAL HYSTERECTOMY    ? BREAST BIOPSY Right   ? benign  ? CHOLECYSTECTOMY    ? COLONOSCOPY  11/08/2018  ? Danis-polyps  ? COLONOSCOPY  12/05/2019  ? Danis  ? DILATION AND CURETTAGE OF UTERUS    ? FOOT SURGERY Right 2018  ? removal of melanoma  ? LUMBAR FUSION    ? lymph node removal Right 2018  ? groin   ? POLYPECTOMY    ? TUBAL LIGATION    ? ? ?Allergies: ?Allergies  ?Allergen Reactions  ? Doxycycline Rash  ? Ciprofloxacin Rash  ?  "very bad rash"  ? Penicillins Rash  ?  Has patient had a PCN reaction causing immediate rash, facial/tongue/throat swelling, SOB or lightheadedness with hypotension: No ?Has patient had a PCN reaction causing severe rash involving mucus membranes or skin necrosis: No ?Has patient had a PCN reaction that required hospitalization: No ?Has patient had a PCN reaction occurring within the last 10 years: No ?If all of the above answers are "NO", then may proceed with  Cephalosporin use. ?  ? ? ?Outpatient Meds: ?Current Outpatient Medications  ?Medication Sig Dispense Refill  ? amitriptyline (ELAVIL) 50 MG tablet Take 50 mg by mouth at bedtime.    ? Cholecalciferol (VITAMIN D) 50 MCG (2000 UT) CAPS Take by mouth.    ? Cyanocobalamin (VITAMIN B-12 PO) Take by mouth daily.    ? escitalopram (LEXAPRO) 10 MG tablet Take 1 tablet (10 mg total) by mouth daily. 30 tablet 3  ? furosemide (LASIX) 40 MG tablet Take 1 tablet (40 mg total) by mouth daily. 90 tablet 4  ? HYDROcodone-acetaminophen (NORCO/VICODIN) 5-325 MG tablet Take 1 tablet by mouth every 6 (six) hours as needed for severe pain. 5 tablet 0  ? hydrocortisone (CORTEF) 10 MG tablet 10 mg daily. 15 mg in the am and 5 mg in the pm  3  ? losartan (COZAAR) 50 MG tablet Take 1 tablet (50 mg total) by mouth daily. 90 tablet 1  ? pregabalin (LYRICA) 150 MG capsule 150 mg in am and 300 mg in pm    ? simvastatin (ZOCOR) 10 MG tablet TAKE 1 TABLET BY MOUTH EVERYDAY AT BEDTIME 90 tablet 4  ? zolpidem (AMBIEN) 5 MG tablet Take 1 tablet (5 mg total) by mouth at bedtime as needed for sleep. 30 tablet 3  ?  meclizine (ANTIVERT) 25 MG tablet Take 1 tablet (25 mg total) by mouth 3 (three) times daily as needed for dizziness. (Patient not taking: Reported on 03/05/2021) 30 tablet 1  ? meloxicam (MOBIC) 15 MG tablet TAKE 1 TABLET(15 MG) BY MOUTH DAILY (Patient not taking: Reported on 03/05/2021) 30 tablet 0  ? ?No current facility-administered medications for this visit.  ? ? ? ? ?___________________________________________________________________ ?Objective  ? ?Exam: ? ?BP (!) 162/79   Pulse 74   Temp (!) 97.3 ?F (36.3 ?C)   Ht 5\' 4"  (1.626 m)   Wt 201 lb (91.2 kg)   BMI 34.50 kg/m?  ? ?CV: RRR without murmur, S1/S2 ?Resp: clear to auscultation bilaterally, normal RR and effort noted ?GI: soft, no tenderness, with active bowel sounds. ? ? ?Assessment: ?Encounter Diagnosis  ?Name Primary?  ? Personal history of colonic polyps Yes   ? ? ? ?Plan: ?Colonoscopy ? The benefits and risks of the planned procedure were described in detail with the patient or (when appropriate) their health care proxy.  Risks were outlined as including, but not limited to, bleeding, infection, perforation, adverse medication reaction leading to cardiac or pulmonary decompensation, pancreatitis (if ERCP).  The limitation of incomplete mucosal visualization was also discussed.  No guarantees or warranties were given. ? ? ? ?The patient is appropriate for an endoscopic procedure in the ambulatory setting. ? ? - Wilfrid Lund, MD ? ? ? ? ?

## 2021-03-19 NOTE — Op Note (Signed)
[place] ?Patient Name: Sarah [name] ?Procedure Date: 03/19/2021 10:41 AM ?MRN: [id number] ?Endoscopist: Estill Cotta. Loletha Carrow , MD ?Age: 71 ?Referring MD:  ?Date of Birth: 26-Oct-1950 ?Gender: Female ?Account #: [id number] ?Procedure:                Colonoscopy ?Indications:              Surveillance: History of numerous (> 10) adenomas  ?                          on last colonoscopy (< 3 yrs) ?                          21 SSP and TA Oct 2020, 5 TAs Nov 2021 ?Medicines:                Monitored Anesthesia Care ?Procedure:                Pre-Anesthesia Assessment: ?                          - Prior to the procedure, a History and Physical  ?                          was performed, and patient medications and  ?                          allergies were reviewed. The patient's tolerance of  ?                          previous anesthesia was also reviewed. The risks  ?                          and benefits of the procedure and the sedation  ?                          options and risks were discussed with the patient.  ?                          All questions were answered, and informed consent  ?                          was obtained. Prior Anticoagulants: The patient has  ?                          taken no previous anticoagulant or antiplatelet  ?                          agents. ASA Grade Assessment: II - A patient with  ?                          mild systemic disease. After reviewing the risks  ?                          and benefits, the patient was deemed in  ?  satisfactory condition to undergo the procedure. ?                          After obtaining informed consent, the colonoscope  ?                          was passed under direct vision. Throughout the  ?                          procedure, the patient's blood pressure, pulse, and  ?                          oxygen saturations were monitored continuously. The  ?                          CF [id number] #[id number] was introduced through  the anus  ?                          and advanced to the the cecum, identified by  ?                          appendiceal orifice and ileocecal valve. The  ?                          colonoscopy was performed with difficulty due to a  ?                          redundant colon and significant looping. Successful  ?                          completion of the procedure was aided by using  ?                          manual pressure, straightening and shortening the  ?                          scope to obtain bowel loop reduction and lavage.  ?                          The patient tolerated the procedure well. The  ?                          quality of the bowel preparation was generally good  ?                          after lavage, scattered fibrous debris could not be  ?                          completely cleared. The ileocecal valve,  ?                          appendiceal orifice, and rectum were photographed.  ?  The bowel preparation used was GoLYTELY. ?Scope In: 10:50:07 AM ?Scope Out: 11:12:01 AM ?Scope Withdrawal Time: 0 hours 11 minutes 33 seconds  ?Total Procedure Duration: 0 hours 21 minutes 54 seconds  ?Findings:                 The perianal and digital rectal examinations were  ?                          normal. ?                          A diminutive polyp was found in the splenic  ?                          flexure. The polyp was semi-sessile. The polyp was  ?                          removed with a cold biopsy forceps. Resection and  ?                          retrieval were complete. ?                          Multiple diverticula were found in the left colon. ?                          The colon (entire examined portion) was redundant. ?                          The exam was otherwise without abnormality on  ?                          direct and retroflexion views. ?Complications:            No immediate complications. ?Estimated Blood Loss:     Estimated blood loss was  minimal. ?Impression:               - One diminutive polyp at the splenic flexure,  ?                          removed with a cold biopsy forceps. Resected and  ?                          retrieved. ?                          - Diverticulosis in the left colon. ?                          - Redundant colon. ?                          - The examination was otherwise normal on direct  ?                          and retroflexion views. ?Recommendation:           -  Patient has a contact number available for  ?                          emergencies. The signs and symptoms of potential  ?                          delayed complications were discussed with the  ?                          patient. Return to normal activities tomorrow.  ?                          Written discharge instructions were provided to the  ?                          patient. ?                          - Resume previous diet. ?                          - Continue present medications. ?                          - Await pathology results. ?                          - Repeat colonoscopy in 3 years for surveillance. ?Lyden Redner L. Loletha Carrow, MD ?03/19/2021 11:19:58 AM ?This report has been signed electronically. ?

## 2021-03-21 ENCOUNTER — Telehealth: Payer: Self-pay

## 2021-03-21 NOTE — Telephone Encounter (Signed)
?  Follow up Call- ? ?Call back number 03/19/2021 12/05/2019 11/08/2018  ?Post procedure Call Back phone  # [phone]  ?Permission to leave phone message Yes Yes Yes  ?Some recent data might be hidden  ?  ? ?Patient questions: ? ?Do you have a fever, pain , or abdominal swelling? No. ?Pain Score  0 * ? ?Have you tolerated food without any problems? Yes.   ? ?Have you been able to return to your normal activities? Yes.   ? ?Do you have any questions about your discharge instructions: ?Diet   No. ?Medications  No. ?Follow up visit  No. ? ?Do you have questions or concerns about your Care? No. ? ?Actions: ?* If pain score is 4 or above: ?No action needed, pain <4. ? ? ?

## 2021-03-24 ENCOUNTER — Encounter: Payer: Self-pay | Admitting: Gastroenterology

## 2021-04-18 ENCOUNTER — Telehealth: Payer: Self-pay | Admitting: Nurse Practitioner

## 2021-04-18 NOTE — Telephone Encounter (Signed)
N/A unable to leave a message for patient to call back and schedule the Medicare Annual Wellness Visit (AWV) virtually or by telephone. ? ?Last AWV 04/23/20 ? ?Please schedule at anytime with [facility] Health Advisor. ? ?30 minute appointment ? ?Any questions, please call me at [phone]  ?

## 2021-04-19 NOTE — Patient Instructions (Addendum)
Please call to schedule your mammogram and/or bone density: ?[facility] at [facility]  ?Address: [address], [place], [place] [address] ?Phone: [phone]  ? ?Diabetes Mellitus and Nutrition, Adult ?When you have diabetes, or diabetes mellitus, it is very important to have healthy eating habits because your blood sugar (glucose) levels are greatly affected by what you eat and drink. Eating healthy foods in the right amounts, at about the same times every day, can help you: ?Manage your blood glucose. ?Lower your risk of heart disease. ?Improve your blood pressure. ?Reach or maintain a healthy weight. ?What can affect my meal plan? ?Every person with diabetes is different, and each person has different needs for a meal plan. Your health care provider may recommend that you work with a dietitian to make a meal plan that is best for you. Your meal plan may vary depending on factors such as: ?The calories you need. ?The medicines you take. ?Your weight. ?Your blood glucose, blood pressure, and cholesterol levels. ?Your activity level. ?Other health conditions you have, such as heart or kidney disease. ?How do carbohydrates affect me? ?Carbohydrates, also called carbs, affect your blood glucose level more than any other type of food. Eating carbs raises the amount of glucose in your blood. ?It is important to know how many carbs you can safely have in each meal. This is different for every person. Your dietitian can help you calculate how many carbs you should have at each meal and for each snack. ?How does alcohol affect me? ?Alcohol can cause a decrease in blood glucose (hypoglycemia), especially if you use insulin or take certain diabetes medicines by mouth. Hypoglycemia can be a life-threatening condition. Symptoms of hypoglycemia, such as sleepiness, dizziness, and confusion, are similar to symptoms of having too much alcohol. ?Do not drink alcohol if: ?Your health care provider  tells you not to drink. ?You are pregnant, may be pregnant, or are planning to become pregnant. ?If you drink alcohol: ?Limit how much you have to: ?0-1 drink a day for women. ?0-2 drinks a day for men. ?Know how much alcohol is in your drink. In the [place] one drink equals one 12 oz bottle of beer (355 mL), one 5 oz glass of wine (148 mL), or one 1? oz glass of hard liquor (44 mL). ?Keep yourself hydrated with water, diet soda, or unsweetened iced tea. Keep in mind that regular soda, juice, and other mixers may contain a lot of sugar and must be counted as carbs. ?What are tips for following this plan? ?Reading food labels ?Start by checking the serving size on the Nutrition Facts label of packaged foods and drinks. The number of calories and the amount of carbs, fats, and other nutrients listed on the label are based on one serving of the item. Many items contain more than one serving per package. ?Check the total grams (g) of carbs in one serving. ?Check the number of grams of saturated fats and trans fats in one serving. Choose foods that have a low amount or none of these fats. ?Check the number of milligrams (mg) of salt (sodium) in one serving. Most people should limit total sodium intake to less than 2,300 mg per day. ?Always check the nutrition information of foods labeled as "low-fat" or "nonfat." These foods may be higher in added sugar or refined carbs and should be avoided. ?Talk to your dietitian to identify your daily goals for nutrients listed on the label. ?Shopping ?Avoid buying  canned, pre-made, or processed foods. These foods tend to be high in fat, sodium, and added sugar. ?Shop around the outside edge of the grocery store. This is where you will most often find fresh fruits and vegetables, bulk grains, fresh meats, and fresh dairy products. ?Cooking ?Use low-heat cooking methods, such as baking, instead of high-heat cooking methods, such as deep frying. ?Cook using healthy oils, such as olive,  canola, or sunflower oil. ?Avoid cooking with butter, cream, or high-fat meats. ?Meal planning ?Eat meals and snacks regularly, preferably at the same times every day. Avoid going long periods of time without eating. ?Eat foods that are high in fiber, such as fresh fruits, vegetables, beans, and whole grains. ?Eat 4-6 oz (112-168 g) of lean protein each day, such as lean meat, chicken, fish, eggs, or tofu. One ounce (oz) (28 g) of lean protein is equal to: ?1 oz (28 g) of meat, chicken, or fish. ?1 egg. ?? cup (62 g) of tofu. ?Eat some foods each day that contain healthy fats, such as avocado, nuts, seeds, and fish. ?What foods should I eat? ?Fruits ?Berries. Apples. Oranges. Peaches. Apricots. Plums. Grapes. Mangoes. Papayas. Pomegranates. Kiwi. Cherries. ?Vegetables ?Leafy greens, including lettuce, spinach, kale, chard, collard greens, mustard greens, and cabbage. Beets. Cauliflower. Broccoli. Carrots. Green beans. Tomatoes. Peppers. Onions. Cucumbers. Brussels sprouts. ?Grains ?Whole grains, such as whole-wheat or whole-grain bread, crackers, tortillas, cereal, and pasta. Unsweetened oatmeal. Quinoa. Brown or wild rice. ?Meats and other proteins ?Seafood. Poultry without skin. Lean cuts of poultry and beef. Tofu. Nuts. Seeds. ?Dairy ?Low-fat or fat-free dairy products such as milk, yogurt, and cheese. ?The items listed above may not be a complete list of foods and beverages you can eat and drink. Contact a dietitian for more information. ?What foods should I avoid? ?Fruits ?Fruits canned with syrup. ?Vegetables ?Canned vegetables. Frozen vegetables with butter or cream sauce. ?Grains ?Refined white flour and flour products such as bread, pasta, snack foods, and cereals. Avoid all processed foods. ?Meats and other proteins ?Fatty cuts of meat. Poultry with skin. Breaded or fried meats. Processed meat. Avoid saturated fats. ?Dairy ?Full-fat yogurt, cheese, or milk. ?Beverages ?Sweetened drinks, such as soda or  iced tea. ?The items listed above may not be a complete list of foods and beverages you should avoid. Contact a dietitian for more information. ?Questions to ask a health care provider ?Do I need to meet with a certified diabetes care and education specialist? ?Do I need to meet with a dietitian? ?What number can I call if I have questions? ?When are the best times to check my blood glucose? ?Where to find more information: ?American Diabetes Association: diabetes.org ?Academy of Nutrition and Dietetics: eatright.org ?[employer] of Diabetes and Digestive and Kidney Diseases: AmenCredit.is ?Association of Diabetes Care & Education Specialists: diabeteseducator.org ?Summary ?It is important to have healthy eating habits because your blood sugar (glucose) levels are greatly affected by what you eat and drink. It is important to use alcohol carefully. ?A healthy meal plan will help you manage your blood glucose and lower your risk of heart disease. ?Your health care provider may recommend that you work with a dietitian to make a meal plan that is best for you. ?This information is not intended to replace advice given to you by your health care provider. Make sure you discuss any questions you have with your health care provider. ?Document Revised: 08/09/2019 Document Reviewed: 08/09/2019 ?Elsevier Patient Education ? [place]. ? ?

## 2021-04-23 ENCOUNTER — Encounter: Payer: Medicare Other | Admitting: Nurse Practitioner

## 2021-04-24 ENCOUNTER — Encounter: Payer: Medicare Other | Admitting: Internal Medicine

## 2021-04-25 ENCOUNTER — Ambulatory Visit (INDEPENDENT_AMBULATORY_CARE_PROVIDER_SITE_OTHER): Payer: Medicare Other | Admitting: Nurse Practitioner

## 2021-04-25 ENCOUNTER — Encounter: Payer: Self-pay | Admitting: Nurse Practitioner

## 2021-04-25 VITALS — BP 107/68 | HR 76 | Temp 98.2°F | Ht 64.5 in | Wt 220.2 lb

## 2021-04-25 DIAGNOSIS — F3341 Major depressive disorder, recurrent, in partial remission: Secondary | ICD-10-CM

## 2021-04-25 DIAGNOSIS — F5105 Insomnia due to other mental disorder: Secondary | ICD-10-CM

## 2021-04-25 DIAGNOSIS — E271 Primary adrenocortical insufficiency: Secondary | ICD-10-CM

## 2021-04-25 DIAGNOSIS — F99 Mental disorder, not otherwise specified: Secondary | ICD-10-CM

## 2021-04-25 DIAGNOSIS — Z23 Encounter for immunization: Secondary | ICD-10-CM

## 2021-04-25 DIAGNOSIS — Z0000 Encounter for general adult medical examination without abnormal findings: Secondary | ICD-10-CM | POA: Diagnosis not present

## 2021-04-25 DIAGNOSIS — Z1159 Encounter for screening for other viral diseases: Secondary | ICD-10-CM | POA: Diagnosis not present

## 2021-04-25 DIAGNOSIS — E559 Vitamin D deficiency, unspecified: Secondary | ICD-10-CM

## 2021-04-25 DIAGNOSIS — I152 Hypertension secondary to endocrine disorders: Secondary | ICD-10-CM

## 2021-04-25 DIAGNOSIS — E785 Hyperlipidemia, unspecified: Secondary | ICD-10-CM

## 2021-04-25 DIAGNOSIS — E1159 Type 2 diabetes mellitus with other circulatory complications: Secondary | ICD-10-CM

## 2021-04-25 DIAGNOSIS — E1169 Type 2 diabetes mellitus with other specified complication: Secondary | ICD-10-CM | POA: Diagnosis not present

## 2021-04-25 DIAGNOSIS — E669 Obesity, unspecified: Secondary | ICD-10-CM

## 2021-04-25 DIAGNOSIS — Z85820 Personal history of malignant melanoma of skin: Secondary | ICD-10-CM

## 2021-04-25 DIAGNOSIS — G6289 Other specified polyneuropathies: Secondary | ICD-10-CM

## 2021-04-25 LAB — BAYER DCA HB A1C WAIVED: HB A1C (BAYER DCA - WAIVED): 5.8 % — ABNORMAL HIGH (ref 4.8–5.6)

## 2021-04-25 MED ORDER — SHINGRIX 50 MCG/0.5ML IM SUSR: 0.5000 mL | Freq: Once | INTRAMUSCULAR | 0 refills | Status: AC

## 2021-04-25 MED ORDER — LOSARTAN POTASSIUM 100 MG PO TABS: 100.0000 mg | ORAL_TABLET | Freq: Every day | ORAL | 4 refills | Status: DC

## 2021-04-25 NOTE — Assessment & Plan Note (Signed)
Chronic, ongoing.  Followed by endocrinology, continue current regimen as ordered by them.  Recent notes reviewed. 

## 2021-04-25 NOTE — Assessment & Plan Note (Signed)
Chronic, stable.  A1c today trending down to 5.9%, urine ALB 10 in January 2023.  At this time recommend focus on diet and regular activity.  Monitor A1c closely and initiate medication as needed.  Will provide information on diabetic diet next visit. ?

## 2021-04-25 NOTE — Assessment & Plan Note (Signed)
Followed by neurology, recent notes reviewed.  Continue current medication regimen as prescribed by them. 

## 2021-04-25 NOTE — Assessment & Plan Note (Signed)
Chronic, ongoing.  Followed by psychiatry who fills Ambien, continue current medication regimen as prescribed by them. ?

## 2021-04-25 NOTE — Progress Notes (Signed)
? ?BP 107/68   Pulse 76   Temp 98.2 ?F (36.8 ?C) (Oral)   Ht 5' 4.5" (1.638 m)   Wt 220 lb 3.2 oz (99.9 kg)   SpO2 98%   BMI 37.21 kg/m?   ? ?Subjective:  ? ? Patient ID: Sarah [name], female    DOB: 1950-12-17, 71 y.o.   MRN: [id number] ? ?HPI: ?Sarah [name] is a 71 y.o. female presenting on 04/25/2021 for comprehensive medical examination. Current medical complaints include:none ? ?She currently lives with: self at [facility] ?Menopausal Symptoms: no ? ?DIABETES ?Last A1c in January 6.1%.  Not currently on medication.  Her mother had diabetes, as well as grandmother.  Has gained 19 pounds since last visit.  Goes to gym every day -- walks on treadmill and rides bicycles (45 minutes). ? ?Sees [facility] every 6 months for history malignant melanoma in 2018 -- last visit on 04/09/21.  Followed by neurology for neuropathy -- last seen 01/05/21, continues Pregabalin. 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   ?Hypoglycemic episodes: unknown ?Polydipsia/polyuria: no ?Visual disturbance: no ?Chest pain: no ?Paresthesias:  has neuropathy -- followed  by neurology at [facility], takes Lyrica ?Glucose Monitoring: no ?            Accucheck frequency: Not Checking ?            Fasting glucose: ?            Post prandial: ?            Evening: ?            Before meals: ?Taking Insulin?: no ?            Long acting insulin: ?            Short acting insulin: ?Blood Pressure Monitoring: daily ?Retinal Examination: Up to Date -- January had at [facility] ?Foot Exam: Up to Date ?Diabetic Education: Not Completed ?Pneumovax:  Up To Date ?Influenza: Up To Date ?Aspirin: no  ?  ?HYPERTENSION / HYPERLIPIDEMIA ?Continues on Losartan and Simvastatin + Lasix. ?Satisfied with current treatment? yes ?Duration of hypertension: chronic ?BP monitoring frequency: daily ?BP range: 101/62 to 155/84 ?BP  medication side effects: no ?Past BP meds: none ?Duration of hyperlipidemia: chronic ?Cholesterol medication side effects: no ?Cholesterol supplements: none ?Past cholesterol medications:  ?Medication compliance: good compliance ?Aspirin: no ?Recent stressors: no ?Recurrent headaches: no ?Visual changes: no ?Palpitations: no ?Dyspnea: no ?Chest pain: no ?Lower extremity edema: yes -- to right foot where history of melanoma was -- does take Motrin daily in morning ?Dizzy/lightheaded: no  ?  ?DEPRESSION ?Sees [facility] every 5 months.  Continues on Lexapro, Ambien, and Amitriptyline.  Lives at [facility]. ? ?Does have history of suicide attempts, in 2012 was last after her husband passed away.   ?Mood status: stable ?Satisfied with current treatment?: yes ?Symptom severity: moderate  ?Duration of current treatment : chronic ?Side effects: no ?Medication compliance: good compliance ?Psychotherapy/counseling: yes in the past ?Previous psychiatric medications:  ?Depressed mood: no ?Anxious mood: no ?Anhedonia: no ?Significant weight loss or gain: no ?Insomnia: yes hard to fall asleep ?Fatigue: no ?Feelings of worthlessness or guilt: no ?Impaired concentration/indecisiveness: no ?Suicidal ideations: no ?Hopelessness: no ?Crying spells: no ? ?  04/25/2021  ?  1:34 PM 02/04/2021  ? 11:19 AM 11/08/2020  ? 10:37 AM 04/23/2020  ?  11:03 AM 01/16/2019  ? 10:18 AM  ?Depression screen PHQ 2/9  ?Decreased Interest 0 0 1 0 0  ?Down, Depressed, Hopeless 0 0 0 0 0  ?PHQ - 2 Score 0 0 1 0 0  ?Altered sleeping 0 1 0 0   ?Tired, decreased energy 1 1 0 0   ?Change in appetite 1 2 0 0   ?Feeling bad or failure about yourself  0 0 0 0   ?Trouble concentrating 0 0 0 0   ?Moving slowly or fidgety/restless 0 0 0 0   ?Suicidal thoughts 0 0 0 0   ?PHQ-9 Score '2 4 1 '$ 0   ?Difficult doing work/chores Not difficult at all Not difficult at all Not difficult at all    ? ? ? ?  01/16/2019  ? 10:14 AM 04/23/2020  ? 11:07 AM 11/01/2020  ? 12:49 AM  02/04/2021  ? 11:21 AM 04/25/2021  ?  1:11 PM  ?Fall Risk  ?Falls in the past year? 0 1  0 0  ?Was there an injury with Fall? 0 1  0 0  ?Fall Risk Category Calculator 0 3  0 0  ?Fall Risk Category Low High  Low Low  ?Patient Fall Risk Level Low fall risk Low fall risk Low fall risk Low fall risk Low fall risk  ?Patient at Risk for Falls Due to Other (Comment)   No Fall Risks No Fall Risks  ?Fall risk Follow up Education provided Falls evaluation completed  Falls evaluation completed Falls prevention discussed  ?  ?Functional Status Survey: ?Is the patient deaf or have difficulty hearing?: No ?Does the patient have difficulty seeing, even when wearing glasses/contacts?: No ?Does the patient have difficulty concentrating, remembering, or making decisions?: No ?Does the patient have difficulty walking or climbing stairs?: No ?Does the patient have difficulty dressing or bathing?: No ?Does the patient have difficulty doing errands alone such as visiting a doctor's office or shopping?: No. ? ? ?Past Medical History:  ?Past Medical History:  ?Diagnosis Date  ? 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 Adrenal insufficiency ([place])   ? Anxiety   ? B12 deficiency   ? Cancer [facility]) 2018-2019  ? melanoma  ? Cervical radiculitis   ? Constipation   ? Dependent edema   ? Depression   ? Diabetes mellitus   ? PRE,NO MEDICATIONS AS OF 03/05/21  ? Dysmenorrhea   ? H/O: suicide attempt   ? History of antineoplastic chemotherapy   ? History of chemotherapy   ? Hyperlipidemia   ? Hypertension   ? Insomnia   ? 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 PMS (premenstrual syndrome)   ? ? ?Surgical History:  ?Past Surgical History:  ?Procedure Laterality Date  ? ABDOMINAL HYSTERECTOMY    ? BREAST BIOPSY Right   ? benign  ? CHOLECYSTECTOMY    ? COLONOSCOPY  11/08/2018  ? Danis-polyps  ? COLONOSCOPY  12/05/2019  ? Danis  ? DILATION AND CURETTAGE OF UTERUS    ? FOOT SURGERY Right 2018  ? removal of melanoma  ? LUMBAR  FUSION    ? lymph node removal Right 2018  ? groin   ? POLYPECTOMY    ? TUBAL LIGATION    ? ? ?Medications:  ?Current Outpatient Medications on File Prior to Visit  ?Medication Sig  ? amitriptyline (ELAVIL) 50 MG tablet Take 50 mg by mouth at bedtime.  ? Cholecalciferol (VITAMIN D) 50  MCG (2000 UT) CAPS Take by mouth.  ? Cyanocobalamin (VITAMIN B-12 PO) Take by mouth daily.  ? escitalopram (LEXAPRO) 10 MG tablet Take 1 tablet (10 mg total) by mouth daily.  ? furosemide (LASIX) 40 MG tablet Take 1 tablet (40 mg total) by mouth daily.  ? HYDROcodone-acetaminophen (NORCO/VICODIN) 5-325 MG tablet Take 1 tablet by mouth every 6 (six) hours as needed for severe pain.  ? hydrocortisone (CORTEF) 10 MG tablet 10 mg daily. 15 mg in the am and 5 mg in the pm  ? pregabalin (LYRICA) 150 MG capsule 150 mg in am and 300 mg in pm  ? simvastatin (ZOCOR) 10 MG tablet TAKE 1 TABLET BY MOUTH EVERYDAY AT BEDTIME  ? zolpidem (AMBIEN) 5 MG tablet Take 1 tablet (5 mg total) by mouth at bedtime as needed for sleep.  ? ?No current facility-administered medications on file prior to visit.  ? ? ?Allergies:  ?Allergies  ?Allergen Reactions  ? Doxycycline Rash  ? Ciprofloxacin Rash  ?  "very bad rash"  ? Penicillins Rash  ?  Has patient had a PCN reaction causing immediate rash, facial/tongue/throat swelling, SOB or lightheadedness with hypotension: No ?Has patient had a PCN reaction causing severe rash involving mucus membranes or skin necrosis: No ?Has patient had a PCN reaction that required hospitalization: No ?Has patient had a PCN reaction occurring within the last 10 years: No ?If all of the above answers are "NO", then may proceed with Cephalosporin use. ?  ? ? ?Social History:  ?Social History  ? ?Socioeconomic History  ? Marital status: Widowed  ?  Spouse name: deceased  ? Number of children: 1  ? Years of education: 48  ? Highest education level: High school graduate  ?Occupational History  ? Occupation: Retired  ?Tobacco Use  ?  Smoking status: Never  ?  Passive exposure: Never  ? Smokeless tobacco: Never  ?Vaping Use  ? Vaping Use: Never used  ?Substance and Sexual Activity  ? Alcohol use: Not Currently  ? Drug use: Never  ? Sexual activit

## 2021-04-25 NOTE — Assessment & Plan Note (Signed)
Chronic, ongoing.  Continue Simvastatin and adjust as needed.  Lipid panel and CMP today. ?

## 2021-04-25 NOTE — Assessment & Plan Note (Addendum)
Chronic, ongoing.  BP at goal today, however at home is eating more high salt meals and BP has had some elevations on occasion.  Recommend she monitor BP at least a few mornings a week at home and document.  DASH diet at home.  Will increase Losartan to 100 MG daily and continue Lasix at current dosing.  Labs today: CMP today.  Refills as needed.  Losartan for kidney protection.  Return in 6 weeks. ? ?

## 2021-04-25 NOTE — Assessment & Plan Note (Signed)
Followed by dermatology - continue this collaboration, as well as oncology collaboration. 

## 2021-04-25 NOTE — Assessment & Plan Note (Signed)
Chronic, stable.  Denies SI/HI.  Followed by psychiatry -- recent notes reviewed and will maintain current medication regimen as prescribed by them. 

## 2021-04-25 NOTE — Assessment & Plan Note (Signed)
Noted past labs, recommend taking Vitamin D3 2000 units daily and we will obtain DEXA. ?

## 2021-04-26 LAB — COMPREHENSIVE METABOLIC PANEL
ALT: 15 IU/L (ref 0–32)
AST: 10 IU/L (ref 0–40)
Albumin/Globulin Ratio: 2.2 (ref 1.2–2.2)
Albumin: 4.3 g/dL (ref 3.8–4.8)
Alkaline Phosphatase: 87 IU/L (ref 44–121)
BUN/Creatinine Ratio: 15 (ref 12–28)
BUN: 17 mg/dL (ref 8–27)
Bilirubin Total: 0.3 mg/dL (ref 0.0–1.2)
CO2: 23 mmol/L (ref 20–29)
Calcium: 9.3 mg/dL (ref 8.7–10.3)
Chloride: 103 mmol/L (ref 96–106)
Creatinine, Ser: 1.11 mg/dL — ABNORMAL HIGH (ref 0.57–1.00)
Globulin, Total: 2 g/dL (ref 1.5–4.5)
Glucose: 127 mg/dL — ABNORMAL HIGH (ref 70–99)
Potassium: 4.2 mmol/L (ref 3.5–5.2)
Sodium: 143 mmol/L (ref 134–144)
Total Protein: 6.3 g/dL (ref 6.0–8.5)
eGFR: 54 mL/min/{1.73_m2} — ABNORMAL LOW (ref >59)

## 2021-04-26 LAB — LIPID PANEL W/O CHOL/HDL RATIO
Cholesterol, Total: 237 mg/dL — ABNORMAL HIGH (ref 100–199)
HDL: 52 mg/dL (ref >39)
LDL Chol Calc (NIH): 151 mg/dL — ABNORMAL HIGH (ref 0–99)
Triglycerides: 189 mg/dL — ABNORMAL HIGH (ref 0–149)
VLDL Cholesterol Cal: 34 mg/dL (ref 5–40)

## 2021-04-26 LAB — VITAMIN D 25 HYDROXY (VIT D DEFICIENCY, FRACTURES): Vit D, 25-Hydroxy: 29.2 ng/mL — ABNORMAL LOW (ref 30.0–100.0)

## 2021-04-26 LAB — HEPATITIS C ANTIBODY: Hep C Virus Ab: NONREACTIVE

## 2021-04-26 NOTE — Progress Notes (Signed)
Contacted via [place] ?The 10-year ASCVD risk score (Arnett DK, et al., 2019) is: 16% ?  Values used to calculate the score: ?    Age: 71 years ?    Sex: Female ?    Is Non-Hispanic African American: No ?    Diabetic: Yes ?    Tobacco smoker: No ?    Systolic Blood Pressure: 648 mmHg ?    Is BP treated: Yes ?    HDL Cholesterol: 52 mg/dL ?    Total Cholesterol: 237 mg/dL ? ? ?Good morning Sarah [name], it is a rainy day.  Your labs have returned: ?- Kidney function, creatinine and eGFR, is showing some reduction this check -- mild kidney disease.  The increase in Losartan may benefit this and also help lower blood pressure, which can help this.  Please increase water intake, add a little lemon to this, and decrease salt intake.  We will recheck next visit. ?- Liver function, AST and ALT is normal. ?- Vitamin D remains on lower side, but is trending up.  Please ensure to take Vitamin D3 2000 units daily and obtain bone density with mammogram. ?- Cholesterol levels remain above goal, have you taken Rosuvastatin before?  I recommend stopping Simvastatin and starting Rosuvastatin for better stroke prevention and cholesterol control.  Would you be okay with this? ?- One time Hep C screening which is done on all adults is negative:) Any questions? ?Keep being amazing!!  Thank you for allowing me to participate in your care.  I appreciate you. ?Kindest regards, ?Riverlyn Kizziah ? ?

## 2021-05-01 ENCOUNTER — Telehealth: Payer: Self-pay | Admitting: Nurse Practitioner

## 2021-05-01 NOTE — Telephone Encounter (Signed)
Patient states she was having increased dizziness/feeling faint with increased dosing of losartan. Patient states she stopped the medication completely and she is much better. Patient is requesting an alternative medication for her BP. Patient states she is continuing to monitor her BP and it is still in same ranges it was previous- no big changes yet. ?

## 2021-05-01 NOTE — Telephone Encounter (Signed)
Copied from [place] [phone]. Topic: General - Other ?>> May 01, 2021 11:39 AM Tessa Lerner A wrote: ?Reason for CRM: The patient would like to speak with a member of staff when possible ? ?The patient's losartan (COZAAR) 100 MG tablet [[id number]] has been recently increased and they're noticing dizziness and headaches since taking the new medication  ? ?The patient stopped taking the medication on 04/28/21 and shares that they've began to feel better as of 04/30/21 ? ?Please contact further when possible ?

## 2021-05-02 NOTE — Telephone Encounter (Signed)
Patient notified of dose change per Marnee Guarneri NP. Pt voiced understanding.  ?

## 2021-05-25 NOTE — Patient Instructions (Signed)

## 2021-05-30 ENCOUNTER — Ambulatory Visit (INDEPENDENT_AMBULATORY_CARE_PROVIDER_SITE_OTHER): Payer: Medicare Other | Admitting: Nurse Practitioner

## 2021-05-30 ENCOUNTER — Encounter: Payer: Self-pay | Admitting: Nurse Practitioner

## 2021-05-30 VITALS — BP 140/78 | HR 76 | Temp 98.1°F

## 2021-05-30 DIAGNOSIS — E785 Hyperlipidemia, unspecified: Secondary | ICD-10-CM | POA: Diagnosis not present

## 2021-05-30 DIAGNOSIS — E1169 Type 2 diabetes mellitus with other specified complication: Secondary | ICD-10-CM

## 2021-05-30 DIAGNOSIS — I152 Hypertension secondary to endocrine disorders: Secondary | ICD-10-CM | POA: Diagnosis not present

## 2021-05-30 DIAGNOSIS — E1159 Type 2 diabetes mellitus with other circulatory complications: Secondary | ICD-10-CM | POA: Diagnosis not present

## 2021-05-30 MED ORDER — AMLODIPINE BESYLATE 2.5 MG PO TABS: 2.5000 mg | ORAL_TABLET | Freq: Every day | ORAL | 4 refills | Status: DC

## 2021-05-30 MED ORDER — ROSUVASTATIN CALCIUM 10 MG PO TABS: 10.0000 mg | ORAL_TABLET | Freq: Every day | ORAL | 3 refills | Status: DC

## 2021-05-30 MED ORDER — LOSARTAN POTASSIUM 50 MG PO TABS: 50.0000 mg | ORAL_TABLET | Freq: Every day | ORAL | 4 refills | Status: DC

## 2021-05-30 NOTE — Progress Notes (Signed)
? ?BP 140/78 (BP Location: Left Arm, Patient Position: Sitting, Cuff Size: Normal)   Pulse 76   Temp 98.1 ?F (36.7 ?C) (Oral)   SpO2 94%   ? ?Subjective:  ? ? Patient ID: Sarah [name], female    DOB: 1950-03-15, 71 y.o.   MRN: [id number] ? ?HPI: ?Loyd Salvador is a 72 y.o. female ? ?Chief Complaint  ?Patient presents with  ? Hypertension  ? ?HYPERTENSION / HYPERLIPIDEMIA ?Increased Losartan to 100 MG daily on 04/25/21, but she is currently continuing to take 50 MG due to dizziness with increased Losartan dosing.  Continues Lasix 40 MG daily.  For HLD taking Simvastatin, but recent levels were not at goal and recommended Crestor. ?Satisfied with current treatment? yes ?Duration of hypertension: chronic ?BP monitoring frequency: daily ?BP range: 120/68 to 157/86 ?BP medication side effects: no ?Past BP meds: Losartan and Lasix ?Duration of hyperlipidemia: chronic ?Cholesterol medication side effects: no ?Cholesterol supplements: none ?Past cholesterol medications: Simvastatin ?Medication compliance: good compliance ?Aspirin: no ?Recent stressors: no ?Recurrent headaches: no ?Visual changes: no ?Palpitations: no ?Dyspnea: no ?Chest pain: no ?Lower extremity edema: no ?Dizzy/lightheaded: no  ? ?Relevant past medical, surgical, family and social history reviewed and updated as indicated. Interim medical history since our last visit reviewed. ?Allergies and medications reviewed and updated. ? ?Review of Systems  ?Constitutional:  Negative for activity change, appetite change, diaphoresis, fatigue and fever.  ?Respiratory:  Negative for cough, chest tightness and shortness of breath.   ?Cardiovascular:  Negative for chest pain, palpitations and leg swelling.  ?Gastrointestinal: Negative.   ?Neurological: Negative.   ?Psychiatric/Behavioral: Negative.    ? ?Per HPI unless specifically indicated above ? ?   ?Objective:  ?  ?BP 140/78 (BP Location: Left Arm, Patient Position: Sitting, Cuff Size: Normal)   Pulse 76   Temp  98.1 ?F (36.7 ?C) (Oral)   SpO2 94%   ?Wt Readings from Last 3 Encounters:  ?04/25/21 220 lb 3.2 oz (99.9 kg)  ?03/19/21 201 lb (91.2 kg)  ?03/05/21 201 lb (91.2 kg)  ?  ?Physical Exam ?Vitals and nursing note reviewed.  ?Constitutional:   ?   General: She is awake. She is not in acute distress. ?   Appearance: She is well-developed and well-groomed. She is obese. She is not ill-appearing or toxic-appearing.  ?HENT:  ?   Head: Normocephalic.  ?   Right Ear: Hearing normal.  ?   Left Ear: Hearing normal.  ?Eyes:  ?   General: Lids are normal.     ?   Right eye: No discharge.     ?   Left eye: No discharge.  ?   Conjunctiva/sclera: Conjunctivae normal.  ?   Pupils: Pupils are equal, round, and reactive to light.  ?Neck:  ?   Thyroid: No thyromegaly.  ?   Vascular: No carotid bruit.  ?Cardiovascular:  ?   Rate and Rhythm: Normal rate and regular rhythm.  ?   Heart sounds: Normal heart sounds. No murmur heard. ?  No gallop.  ?Pulmonary:  ?   Effort: Pulmonary effort is normal. No accessory muscle usage or respiratory distress.  ?   Breath sounds: Normal breath sounds.  ?Abdominal:  ?   General: Bowel sounds are normal.  ?   Palpations: Abdomen is soft. There is no hepatomegaly or splenomegaly.  ?Musculoskeletal:  ?   Cervical back: Normal range of motion and neck supple.  ?   Right lower leg: 1+ Pitting Edema present.  ?  Left lower leg: 1+ Pitting Edema present.  ?Skin: ?   General: Skin is warm and dry.  ?Neurological:  ?   Mental Status: She is alert and oriented to person, place, and time.  ?   Deep Tendon Reflexes: Reflexes are normal and symmetric.  ?   Reflex Scores: ?     Brachioradialis reflexes are 2+ on the right side and 2+ on the left side. ?     Patellar reflexes are 2+ on the right side and 2+ on the left side. ?Psychiatric:     ?   Attention and Perception: Attention normal.     ?   Mood and Affect: Mood normal.     ?   Speech: Speech normal.     ?   Behavior: Behavior normal. Behavior is cooperative.      ?   Thought Content: Thought content normal.  ? ? ?Results for orders placed or performed in visit on 04/25/21  ?Bayer DCA Hb A1c Waived  ?Result Value Ref Range  ? HB A1C (BAYER DCA - WAIVED) 5.8 (H) 4.8 - 5.6 %  ?Comprehensive metabolic panel  ?Result Value Ref Range  ? Glucose 127 (H) 70 - 99 mg/dL  ? BUN 17 8 - 27 mg/dL  ? Creatinine, Ser 1.11 (H) 0.57 - 1.00 mg/dL  ? eGFR 54 (L) >59 mL/min/1.73  ? BUN/Creatinine Ratio 15 12 - 28  ? Sodium 143 134 - 144 mmol/L  ? Potassium 4.2 3.5 - 5.2 mmol/L  ? Chloride 103 96 - 106 mmol/L  ? CO2 23 20 - 29 mmol/L  ? Calcium 9.3 8.7 - 10.3 mg/dL  ? Total Protein 6.3 6.0 - 8.5 g/dL  ? Albumin 4.3 3.8 - 4.8 g/dL  ? Globulin, Total 2.0 1.5 - 4.5 g/dL  ? Albumin/Globulin Ratio 2.2 1.2 - 2.2  ? Bilirubin Total 0.3 0.0 - 1.2 mg/dL  ? Alkaline Phosphatase 87 44 - 121 IU/L  ? AST 10 0 - 40 IU/L  ? ALT 15 0 - 32 IU/L  ?Lipid Panel w/o Chol/HDL Ratio  ?Result Value Ref Range  ? Cholesterol, Total 237 (H) 100 - 199 mg/dL  ? Triglycerides 189 (H) 0 - 149 mg/dL  ? HDL 52 >39 mg/dL  ? VLDL Cholesterol Cal 34 5 - 40 mg/dL  ? LDL Chol Calc (NIH) 151 (H) 0 - 99 mg/dL  ?VITAMIN D 25 Hydroxy (Vit-D Deficiency, Fractures)  ?Result Value Ref Range  ? Vit D, 25-Hydroxy 29.2 (L) 30.0 - 100.0 ng/mL  ?Hepatitis C antibody  ?Result Value Ref Range  ? Hep C Virus Ab Non Reactive Non Reactive  ? ?   ?Assessment & Plan:  ? ?Problem List Items Addressed This Visit   ? ?  ? Cardiovascular and Mediastinum  ? Hypertension associated with diabetes ([place]) - Primary  ?  Chronic, ongoing.  BP remains above goal more consistently, especially SBP. Increased Losartan to 100 MG caused dizziness. At this time will continue Losartan 50 MG daily and Lasix 40 MG daily (has chronic edema BLE) and add on Amlodipine at very low dose 2.5 MG (suspect more ISH present) -- educated her on this medication and side effects. Recommend she monitor BP at least a few mornings a week at home and document.  DASH diet at home.   Refills as needed.  Losartan for kidney protection.  Return in 6 weeks. ? ? ?  ?  ? Relevant Medications  ? amLODipine (NORVASC) 2.5 MG tablet  ?  rosuvastatin (CRESTOR) 10 MG tablet  ? losartan (COZAAR) 50 MG tablet  ?  ? Endocrine  ? Hyperlipidemia associated with type 2 diabetes mellitus ([place])  ?  Chronic, ongoing.  Levels remain above goal for prevention.  Stop Simvastatin and start Rosuvastatin 10 MG daily, discussed with patient at length.  Lipid panel and CMP next visit. ? ?  ?  ? Relevant Medications  ? amLODipine (NORVASC) 2.5 MG tablet  ? rosuvastatin (CRESTOR) 10 MG tablet  ? losartan (COZAAR) 50 MG tablet  ?  ? ?Follow up plan: ?Return in about 6 weeks (around 07/11/2021) for HTN and HLD -- added Amlodipine and Changed to Rosuvastatin.  ?

## 2021-05-30 NOTE — Assessment & Plan Note (Signed)
Chronic, ongoing.  Levels remain above goal for prevention.  Stop Simvastatin and start Rosuvastatin 10 MG daily, discussed with patient at length.  Lipid panel and CMP next visit. ?

## 2021-05-30 NOTE — Assessment & Plan Note (Addendum)
Chronic, ongoing.  BP remains above goal more consistently, especially SBP. Increased Losartan to 100 MG caused dizziness. At this time will continue Losartan 50 MG daily and Lasix 40 MG daily (has chronic edema BLE) and add on Amlodipine at very low dose 2.5 MG (suspect more ISH present) -- educated her on this medication and side effects. Recommend she monitor BP at least a few mornings a week at home and document.  DASH diet at home.  Refills as needed.  Losartan for kidney protection.  Return in 6 weeks. ? ?

## 2021-06-09 ENCOUNTER — Other Ambulatory Visit: Payer: Self-pay | Admitting: Behavioral Health

## 2021-06-09 DIAGNOSIS — F3341 Major depressive disorder, recurrent, in partial remission: Secondary | ICD-10-CM

## 2021-06-09 DIAGNOSIS — F411 Generalized anxiety disorder: Secondary | ICD-10-CM

## 2021-06-09 NOTE — Telephone Encounter (Signed)
Has appt with Brian this week.  

## 2021-06-10 ENCOUNTER — Telehealth: Payer: Self-pay | Admitting: Nurse Practitioner

## 2021-06-10 NOTE — Telephone Encounter (Signed)
Copied from [place] [id number]. Topic: Medicare AWV >> Jun 10, 2021  3:26 PM Lavonia Drafts wrote: Reason for CRM:  N/A unable to leave a  message for patient to call back and schedule the Medicare Annual Wellness Visit (AWV) virtually or by telephone.  Last AWV 04/23/20  Please schedule at anytime with [facility]-Nurse Health Advisor.  Any questions, please call me at [phone]

## 2021-06-11 ENCOUNTER — Ambulatory Visit (INDEPENDENT_AMBULATORY_CARE_PROVIDER_SITE_OTHER): Payer: Medicare Other | Admitting: Behavioral Health

## 2021-06-11 ENCOUNTER — Encounter: Payer: Self-pay | Admitting: Behavioral Health

## 2021-06-11 DIAGNOSIS — F411 Generalized anxiety disorder: Secondary | ICD-10-CM

## 2021-06-11 DIAGNOSIS — F5105 Insomnia due to other mental disorder: Secondary | ICD-10-CM | POA: Diagnosis not present

## 2021-06-11 DIAGNOSIS — F3341 Major depressive disorder, recurrent, in partial remission: Secondary | ICD-10-CM | POA: Diagnosis not present

## 2021-06-11 DIAGNOSIS — F99 Mental disorder, not otherwise specified: Secondary | ICD-10-CM | POA: Diagnosis not present

## 2021-06-11 MED ORDER — ZOLPIDEM TARTRATE 5 MG PO TABS: 5.0000 mg | ORAL_TABLET | Freq: Every evening | ORAL | 3 refills | Status: DC | PRN

## 2021-06-11 MED ORDER — ESCITALOPRAM OXALATE 10 MG PO TABS: 10.0000 mg | ORAL_TABLET | Freq: Every day | ORAL | 3 refills | Status: DC

## 2021-06-11 NOTE — Progress Notes (Signed)
[facility]  Patient ID: Elliott Quade,  MRN: [id number]  PCP: Venita Lick, NP  Date of Evaluation: 06/11/2021 Time spent:30 minutes  Chief Complaint:  Chief Complaint   Anxiety; Depression; Follow-up; Medication Refill     HISTORY/CURRENT STATUS: HPI 71 year old female presents to this office for follow up and medication management.  No changes this visit. She says that she continues to feel stable with Lexapro and does not need any changes to her medications at this time.  Says that she has not had any SI in years and feels safe at this time. She was receiving treatment for malignant melanoma and mental health care at [facility] until recently when they could not longer see outpatient clients. She is now living in a retirement community. She reports her anxiety at 1/10 and depression 1/10. She is sleeping well at 7-8 hours at night with aid of Ambien but would like to try a medication that is deemed safer for 65 and older. Denies mania, no psychosis. No SI/HI.    No previous medication failures noted this visit.       Individual Medical History/ Review of Systems: Changes? :No   Allergies: Doxycycline, Ciprofloxacin, and Penicillins  Current Medications:  Current Outpatient Medications:    amitriptyline (ELAVIL) 50 MG tablet, Take 50 mg by mouth at bedtime., Disp: , Rfl:    amLODipine (NORVASC) 2.5 MG tablet, Take 1 tablet (2.5 mg total) by mouth daily., Disp: 90 tablet, Rfl: 4   Cholecalciferol (VITAMIN D) 50 MCG (2000 UT) CAPS, Take by mouth., Disp: , Rfl:    Cyanocobalamin (VITAMIN B-12 PO), Take by mouth daily., Disp: , Rfl:    escitalopram (LEXAPRO) 10 MG tablet, Take 1 tablet (10 mg total) by mouth daily., Disp: 30 tablet, Rfl: 3   furosemide (LASIX) 40 MG tablet, Take 1 tablet (40 mg total) by mouth daily., Disp: 90 tablet, Rfl: 4   HYDROcodone-acetaminophen (NORCO/VICODIN) 5-325 MG tablet, Take 1 tablet by mouth every 6 (six) hours as needed for  severe pain., Disp: 5 tablet, Rfl: 0   hydrocortisone (CORTEF) 10 MG tablet, 10 mg daily. 15 mg in the am and 5 mg in the pm, Disp: , Rfl: 3   losartan (COZAAR) 50 MG tablet, Take 1 tablet (50 mg total) by mouth daily., Disp: 90 tablet, Rfl: 4   pregabalin (LYRICA) 150 MG capsule, 150 mg in am and 300 mg in pm, Disp: , Rfl:    rosuvastatin (CRESTOR) 10 MG tablet, Take 1 tablet (10 mg total) by mouth daily., Disp: 90 tablet, Rfl: 3   zolpidem (AMBIEN) 5 MG tablet, Take 1 tablet (5 mg total) by mouth at bedtime as needed for sleep., Disp: 30 tablet, Rfl: 3 Medication Side Effects: none  Family Medical/ Social History: Changes? No  MENTAL HEALTH EXAM:  There were no vitals taken for this visit.There is no height or weight on file to calculate BMI.  General Appearance: Casual, Neat, and Well Groomed  Eye Contact:  Good  Speech:  Clear and Coherent  Volume:  Normal  Mood:  NA  Affect:  Appropriate  Thought Process:  Coherent  Orientation:  Full (Time, Place, and Person)  Thought Content: Logical   Suicidal Thoughts:  No  Homicidal Thoughts:  No  Memory:  WNL  Judgement:  Good  Insight:  Good  Psychomotor Activity:  Normal  Concentration:  Concentration: Good  Recall:  Good  Fund of Knowledge: Good  Language: Good  Assets:  Desire  for Improvement  ADL's:  Intact  Cognition: WNL  Prognosis:  Good    DIAGNOSES:    ICD-10-CM   1. Generalized anxiety disorder  F41.1 escitalopram (LEXAPRO) 10 MG tablet    2. Recurrent major depressive disorder, in partial remission (HCC)  F33.41 escitalopram (LEXAPRO) 10 MG tablet    3. Insomnia due to other mental disorder  F51.05 zolpidem (AMBIEN) 5 MG tablet   F99       Receiving Psychotherapy: No    RECOMMENDATIONS:   Greater than 50% of 30 min  face to face time with patient was spent on counseling and coordination of care. No changes this visit.  We discussed her current stability with her current medications. She is reporting very  low levels of anxiety and depression with Lexapro. No changes are indicated at this time. Reinforced documented previous and current flagged warning of  QT prolongation and risk with Lexapro. She has been on the medication as reported by Pt since 2018.    To continue Lexapro 10 mg daily To continue Zolpidem 5 mg at bedtime To continue Amitriptyline 50 mg at bedtime Provided  #6, Belsomra 10 mg tablets to try at bedtime. Will not take together with Ambien. Will report any worsening symptoms promptly To follow up in 3 months to reassess per pt request Provided emergency contact information Reviewed Allen, NP

## 2021-06-17 ENCOUNTER — Telehealth: Payer: Self-pay | Admitting: Behavioral Health

## 2021-06-17 ENCOUNTER — Other Ambulatory Visit: Payer: Self-pay | Admitting: Behavioral Health

## 2021-06-17 DIAGNOSIS — F411 Generalized anxiety disorder: Secondary | ICD-10-CM

## 2021-06-17 DIAGNOSIS — F3341 Major depressive disorder, recurrent, in partial remission: Secondary | ICD-10-CM

## 2021-06-17 DIAGNOSIS — F5105 Insomnia due to other mental disorder: Secondary | ICD-10-CM

## 2021-06-17 MED ORDER — BELSOMRA 20 MG PO TABS: 20.0000 mg | ORAL_TABLET | Freq: Every day | ORAL | 1 refills | Status: DC

## 2021-06-17 NOTE — Telephone Encounter (Signed)
Sarah [name] called this morning at 10:35 to request refill of Belsomra.  She said the '10mg'$  helped but does fell it should be increased to '20mg'$  as discussed at appt.  Nest appt 9/25.  Send to  [facility] on [address], [address]

## 2021-06-17 NOTE — Telephone Encounter (Signed)
Pt is requesting increase please review

## 2021-06-17 NOTE — Telephone Encounter (Signed)
Rx sent 

## 2021-07-03 ENCOUNTER — Other Ambulatory Visit: Payer: Self-pay | Admitting: Internal Medicine

## 2021-07-06 NOTE — Patient Instructions (Incomplete)

## 2021-07-11 ENCOUNTER — Ambulatory Visit (INDEPENDENT_AMBULATORY_CARE_PROVIDER_SITE_OTHER): Payer: Medicare Other | Admitting: Nurse Practitioner

## 2021-07-11 ENCOUNTER — Encounter: Payer: Self-pay | Admitting: Nurse Practitioner

## 2021-07-11 VITALS — BP 112/67 | HR 80 | Temp 98.1°F | Ht 64.5 in | Wt 224.0 lb

## 2021-07-11 DIAGNOSIS — I152 Hypertension secondary to endocrine disorders: Secondary | ICD-10-CM

## 2021-07-11 DIAGNOSIS — E1159 Type 2 diabetes mellitus with other circulatory complications: Secondary | ICD-10-CM

## 2021-07-11 DIAGNOSIS — Z1231 Encounter for screening mammogram for malignant neoplasm of breast: Secondary | ICD-10-CM | POA: Diagnosis not present

## 2021-07-11 DIAGNOSIS — Z780 Asymptomatic menopausal state: Secondary | ICD-10-CM

## 2021-07-11 DIAGNOSIS — M5412 Radiculopathy, cervical region: Secondary | ICD-10-CM | POA: Diagnosis not present

## 2021-07-11 DIAGNOSIS — E785 Hyperlipidemia, unspecified: Secondary | ICD-10-CM

## 2021-07-11 DIAGNOSIS — E1169 Type 2 diabetes mellitus with other specified complication: Secondary | ICD-10-CM | POA: Diagnosis not present

## 2021-07-11 NOTE — Assessment & Plan Note (Signed)
Chronic, ongoing.  Tolerating switch to Rosuvastatin and stopping Simvastatin, will adjust as needed.  Lipid panel today.

## 2021-07-11 NOTE — Assessment & Plan Note (Signed)
Would like to return to Dr. Yves Dill, had has similar before.  Referral placed.

## 2021-07-12 LAB — BASIC METABOLIC PANEL
BUN/Creatinine Ratio: 15 (ref 12–28)
BUN: 17 mg/dL (ref 8–27)
CO2: 20 mmol/L (ref 20–29)
Calcium: 9.1 mg/dL (ref 8.7–10.3)
Chloride: 102 mmol/L (ref 96–106)
Creatinine, Ser: 1.11 mg/dL — ABNORMAL HIGH (ref 0.57–1.00)
Glucose: 201 mg/dL — ABNORMAL HIGH (ref 70–99)
Potassium: 4.4 mmol/L (ref 3.5–5.2)
Sodium: 139 mmol/L (ref 134–144)
eGFR: 54 mL/min/{1.73_m2} — ABNORMAL LOW (ref >59)

## 2021-07-12 LAB — LIPID PANEL W/O CHOL/HDL RATIO
Cholesterol, Total: 154 mg/dL (ref 100–199)
HDL: 45 mg/dL (ref >39)
LDL Chol Calc (NIH): 78 mg/dL (ref 0–99)
Triglycerides: 185 mg/dL — ABNORMAL HIGH (ref 0–149)
VLDL Cholesterol Cal: 31 mg/dL (ref 5–40)

## 2021-07-15 ENCOUNTER — Ambulatory Visit (INDEPENDENT_AMBULATORY_CARE_PROVIDER_SITE_OTHER): Payer: Medicare Other | Admitting: *Deleted

## 2021-07-15 DIAGNOSIS — Z0000 Encounter for general adult medical examination without abnormal findings: Secondary | ICD-10-CM | POA: Diagnosis not present

## 2021-08-20 ENCOUNTER — Other Ambulatory Visit: Payer: Self-pay | Admitting: Behavioral Health

## 2021-08-20 DIAGNOSIS — F99 Mental disorder, not otherwise specified: Secondary | ICD-10-CM

## 2021-09-01 ENCOUNTER — Encounter: Payer: Self-pay | Admitting: Nurse Practitioner

## 2021-09-02 MED ORDER — MECLIZINE HCL 25 MG PO TABS: 25.0000 mg | ORAL_TABLET | Freq: Three times a day (TID) | ORAL | 0 refills | Status: DC | PRN

## 2021-09-10 ENCOUNTER — Encounter: Payer: Self-pay | Admitting: Occupational Therapy

## 2021-09-10 ENCOUNTER — Ambulatory Visit: Payer: Medicare Other | Attending: Internal Medicine | Admitting: Occupational Therapy

## 2021-09-10 DIAGNOSIS — I890 Lymphedema, not elsewhere classified: Secondary | ICD-10-CM | POA: Diagnosis present

## 2021-09-11 NOTE — Therapy (Signed)
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[place])    Adrenal insufficiency ([place])    Anxiety    B12 deficiency    Cancer ([place]) 2018-2019   melanoma   Cervical radiculitis    Constipation    Dependent edema    Depression    Diabetes mellitus    PRE,NO MEDICATIONS AS OF 03/05/21   Dysmenorrhea    H/O: suicide attempt    History of antineoplastic chemotherapy    History of chemotherapy    Hyperlipidemia    Hypertension    Insomnia    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[place])    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)    PMS (premenstrual syndrome)    Past Surgical History:  Procedure Laterality Date   BREAST BIOPSY Right    benign   CHOLECYSTECTOMY     COLONOSCOPY  11/08/2018   Danis-polyps   COLONOSCOPY  12/05/2019   Danis   DILATION AND CURETTAGE OF UTERUS     FOOT SURGERY Right 2018   removal of melanoma   LUMBAR FUSION     lymph node removal Right 2018   groin    POLYPECTOMY     TOTAL ABDOMINAL HYSTERECTOMY W/ BILATERAL SALPINGOOPHORECTOMY     TUBAL LIGATION     Patient Active Problem List   Diagnosis Date Noted   Cervical radiculitis 07/11/2021   Vitamin D deficiency 02/05/2021   Adrenal insufficiency (Addison's disease) ([place]) 02/04/2021   Chronic lower back pain 02/02/2021   Insomnia due to other mental disorder 02/02/2021   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   B12 deficiency 02/01/2018   History of antineoplastic chemotherapy 10/17/2017   Prolonged Q-T interval on ECG 09/04/2017   History of malignant melanoma 01/07/2017   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[place]) 12/26/2011   Hypertension associated with diabetes ([place]) 09/17/2010   Hyperlipidemia associated with type 2 diabetes mellitus ([place]) 09/17/2010   Type 2 diabetes mellitus with obesity ([place])  09/17/2010    PCP: Sarah Guarneri, NP  REFERRING PROVIDER: April Salama, MD  REFERRING DIAG: I89.0  THERAPY DIAG:  Lymphedema, not elsewhere classified  Rationale for Evaluation and Treatment Rehabilitation  ONSET DATE: 09/10/21  SUBJECTIVE                                                                                                                                                                                           SUBJECTIVE STATEMENT: Sarah [name]  referred to Occupational Therapy by Sarah Salama,  MD for evaluation and treatment of RLE lymphedema with onset soon after surgical intervention for R plantar foot melanoma. Pt reports pain in leg and swelling has progressed over time moving proximally. It fluctuates some, but never goes away. Swelling and associated pain is worse at the end of the day. Pt has not previously undergone lymphedema treatment and she does not currently wear compression stockings.  PERTINENT HISTORY:  S/P R plantar foot melanoma 2018 chemotherapy  PAIN:  Are you having pain? Yes: NPRS scale: 5/10 Pain location: RLE Pain description: burning, sore Aggravating factors: standing , walking, dependent sitting Relieving factors: elevation   PRECAUTIONS: Other: lymphedema  WEIGHT BEARING RESTRICTIONS No  FALLS:  Has patient fallen in last 6 months? 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, systems, Production manager  HAND DOMINANCE : right   PRIOR LEVEL OF FUNCTION: Independent  PATIENT GOALS reduce leg swelling and associated pain    OBJECTIVE  COGNITION:  Overall cognitive status: Within functional limits for tasks assessed   OBSERVATIONS / OTHER ASSESSMENTS:  Mild, Stage  II, Right Lower Extremity Lymphedema 2/2 Surgical intervention for R foot  Melanoma  Skin  Description Hyper-Keratosis Peau' de Orange Shiny Tight Fibrotic/ Indurated Fatty Doughy Spongy/ boggy      x Mild   x   Skin dry Flaky WNL Macerated   mildly      Color Redness Varicosities Blanching Hemosiderin Stain Mottled     x Light x    Odor Malodorous Yeast Fungal infection  WNL      x   Temperature Warm Cool wnl     x    Pitting Edema   1+ 2+ 3+ 4+ Non-pitting     x       Girth Symmetrical Asymmetrical                   Distribution    R>L toes to groin    Stemmer Sign Positive Negative   Strong +    Lymphorrhea History Of:  Present Absent     x    Wounds History Of Present Absent Venous Arterial Pressure Sheer     x        Signs of Infection Redness Warmth Erythema Acute Swelling Drainage Borders                    Sensation Light Touch Deep pressure Hypersensitivty   Present Impaired Present Impaired Absent Impaired   x  x  x     Nails WNL   Fungus nail dystrophy   x     Hair Growth Symmetrical Asymmetrical   x    Skin Creases Base of toes  Ankles   Base of Fingers knees       Abdominal pannus Thigh Lobules  Face/neck   x x          SENSATION: In tact for lght and deep touch. Neuropathy bilater feet and hands  POSTURE:  W NL   BLE ROM: decreased R knee and ankle AROM 2/2 girth from swelling and skin approximation   LYMPHEDEMA ASSESSMENTS:   SURGERY TYPE/DATE: 11/2016  NUMBER OF LYMPH NODES REMOVED: +1/1  CHEMOTHERAPY: yes  RADIATION:no  HORMONE TREATMENT: no  INFECTIONS: post op infection reported in the record. Pt states she has not  ever had celluliti infections   LYMPHEDEMA ASSESSMENTS:  BLE COMPARATIVE LIMB VOLUMETRICS: TBA OT Rx    1 Visit LANDMARK RIGHT  09/10/21  R LEG (A-D) N/A  R THIGH (E-G) ml  R FULL LIMB (A-G) ml  Limb Volume differential (LVD)  %  Volume change since initial %  Volume change overall V  (Blank rows = not tested)  LANDMARK LEFT  09/10/21  L LEG (A-D) N/A   RLTHIGH (E-G) ml  L FULL LIMB (A-G) ml  Limb Volume differential (LVD)  %  Volume change since initial %  Volume change overall %  (Blank rows = not tested)   G LYMPHEDEMA LIFE IMPACT SCALE (LLIS):TBA OT Rx visit 1  TODAY'S TREATMENT  OT evaluation and treatmebnt   PATIENT EDUCATION:  Education details: Provided basic level education regarding lymphatic structure and function, etiology, onset patterns, stages of progression, and prevention to limit infection risk, worsening condition and further functional decline. Pt edu for aught interaction between blood circulatory system and lymphatic circulation.Discussed  impact of gravity and co-morbidities on lymphatic function. Outlined Complete Decongestive Therapy (CDT)  as standard of care and provided in depth information regarding 4 primary components of Intensive and Self Management Phases, including Manual Lymph Drainage (MLD), compression wrapping and garments, skin care, and therapeutic exercise. Pilar Plate discussion with re need for frequent attendance and high burden of care when caregiver is needed, impact of co morbidities. We discussed  the chronic, progressive nature of lymphedema and Importance of daily, ongoing LE self-care essential for limiting progression and infection risk.  Person educated: Patient Education method: Explanation, Demonstration, Tactile cues, Verbal cues, and Handouts Education comprehension: verbalized understanding, returned demonstration, verbal cues required, tactile cues required, and needs further education   Lymphedema Self-Care HOME PROGRAM: Simple self-MLD Lymphatic pumping there ex Skin care Compression   ASSESSMENT:  CLINICAL IMPRESSION: Patient is a Natara Monfort is a 81 y o female presenting with mild, stage II, RLE lymphedema 2/2 surgical intervention for R plantar foot melanoma. Chronic progressive R limb swelling and associated pain limit's Pt's functional performance in all occupational  domains, including self care/ ADLs (functional ambulation, functional transfers,fitting shoes and lower body clothing , bathing, grooming toe nails), leisure pursuits ( spending time with family, any activity requiring standing,walking, or dependent sitting > 20 minutes)) and productive activities (volunteering) and social participation.Pt will benefit from skilled OT for Intensive and self-management phases of Complete Decongestive Therapy. CDT includes manual lymphatic drainage (MLD), skin care to limit infection risk, compression therapy with multilayer gradient compression bandages, then appropriate compression garments, skin care to limit infection risk, and lymphatic pumping therapeutic exercise. CDT will reduce swelling returning the limb to a size comparable to the non-affected limb, and limit lymphedema progression. Without skilled OT for CDT lymphedema and infection risk will progress and further functional decline is expected.   OBJECTIVE IMPAIRMENTS Abnormal gait, decreased activity tolerance, decreased balance, decreased knowledge of condition, decreased knowledge of use of DME, decreased mobility, decreased ROM, increased edema, impaired flexibility, impaired sensation, and pain, chronmic , progressive leg swelling  ACTIVITY LIMITATIONS  ADLs, productive activities, leisure pursuits, social participation   PARTICIPATION LIMITATIONS: cleaning, driving, shopping, community activity, and occupation  PERSONAL FACTORS 1 comorbidity: HTN  are also affecting patient's functional outcome.   REHAB POTENTIAL: Good  CLINICAL DECISION MAKING: Stable/uncomplicated  EVALUATION COMPLEXITY: Low   GOALS: Goals reviewed with patient? Yes  LONG TERM GOALS: Target date: 12/04/2021  (Remove Blue Hyperlink)  Given this patient's Intake score  of TBA visit 1 on the Lymphedema Life Impact Scale (LLIS), patient will experience an increase of 10 % reduction in her perceived level of functional impairment  resulting from  lymphedema to improve functional performance and quality of life  Baseline:Max A Goal status: INITIAL  2.   Pt will demonstrate understanding of lymphedema precautions and prevention strategies with modified independence using a printed reference to identify at least 5 precautions and discussing how s/he may implement them into daily life to reduce risk of progression and to limit infection risk. Baseline: Max A Goal status: INITIAL  3.  Given this patient's Intake score of 60/100% on the functional outcomes FOTO tool, patient will experience an increase in function of 5 points to improve basic and instrumental ADLs performance, including lymphedema self-care. Baseline: 60/100% Goal status: INITIAL  4.  Pt presents with limited knowledge of lymphedema etiology, treatment protocol and precautions. After skilled education Pt will verbalize understanding of lymphedema diagnosis, treatment and essential self-care routines to ensure optimal, long term self-management and to limit progression. Baseline: Max A Goal status: INITIAL  5.   Pt will demonstrate understanding of lymphedema precautions and prevention strategies with modified independence using a printed reference to identify at least 5 precautions and discussing how s/he may implement them into daily life to reduce risk of progression and to limit infection risk. Baseline: Max A Goal status: INITIAL  6.  With modified independence (extra time)  Pt will be able to apply multilayer, thigh length, compression wraps using gradient techniques from base of toes to popliteal fossa  to decrease limb volume, to limit infection risk, and to limit lymphedema progression.  Baseline: Dependent Goal status: INITIAL   7.  Pt will achieve at least 10% limb volume reduction below the R knee  to return limb to more normal size and shape,  to increase body symmetry for increased balance, to limit infection risk, to decrease pain, and to  limit lymphedema progression. Baseline: Dependent Goal status: INITIAL  8.   Pt will achieve and sustain no less than 85% compliance with all LE self-care home program components throughout CDT, including modified simple self-MLD, daily skin care and inspection, lymphatic pumping the ex and appropriate compression to limit lymphedema progression and to limit further functional decline. Baseline: Dependent Goal status: INITIAL   PLAN: PT FREQUENCY: 2x/week  PT DURATION: x 12 weeks, and other: PRN  for support during slef-management phase CDT  PLANNED INTERVENTIONS:  Complete Decongestive Therapy (CDT): Including MLD, Therapeutic lymphatic pumping exercises, Manual lymph drainage (MLD), Compression bandaging, then appropriate compression garment / device measurement and fitting, and skin care to limit infection. Therapeutic activity,  Patient/Family education,  DME instructions, Kinesiotaping  PLAN FOR NEXT SESSION: BLE comparative l;imb volumetrics and multilayer compression wrapping to one leg from base of toes to below knee   Andrey Spearman, MS, OTR/L, CLT-LANA 09/11/21 2:41 PM

## 2021-09-30 ENCOUNTER — Ambulatory Visit: Payer: Medicare Other | Attending: Internal Medicine | Admitting: Occupational Therapy

## 2021-09-30 DIAGNOSIS — I890 Lymphedema, not elsewhere classified: Secondary | ICD-10-CM | POA: Insufficient documentation

## 2021-10-01 NOTE — Therapy (Signed)
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[place])    Adrenal insufficiency ([place])    Anxiety    B12 deficiency    Cancer ([place]) 2018-2019   melanoma   Cervical radiculitis    Constipation    Dependent edema    Depression    Diabetes mellitus    PRE,NO MEDICATIONS AS OF 03/05/21   Dysmenorrhea    H/O: suicide attempt    History of antineoplastic chemotherapy    History of chemotherapy    Hyperlipidemia    Hypertension    Insomnia    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[place])    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)    PMS (premenstrual syndrome)    Past Surgical History:  Procedure Laterality Date   BREAST BIOPSY Right    benign   CHOLECYSTECTOMY     COLONOSCOPY  11/08/2018   Danis-polyps   COLONOSCOPY  12/05/2019   Danis   DILATION AND CURETTAGE OF UTERUS     FOOT SURGERY Right 2018   removal of melanoma   LUMBAR FUSION     lymph node removal Right 2018   groin    POLYPECTOMY     TOTAL ABDOMINAL HYSTERECTOMY W/ BILATERAL SALPINGOOPHORECTOMY     TUBAL LIGATION     Patient Active Problem List   Diagnosis Date Noted   Cervical radiculitis 07/11/2021   Vitamin D deficiency 02/05/2021   Adrenal insufficiency (Addison's disease) ([place]) 02/04/2021   Chronic lower back pain 02/02/2021   Insomnia due to other mental disorder 02/02/2021   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   B12 deficiency 02/01/2018   History of antineoplastic chemotherapy 10/17/2017   Prolonged Q-T interval on ECG 09/04/2017   History of malignant melanoma 01/07/2017   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[place]) 12/26/2011   Hypertension associated with diabetes ([place]) 09/17/2010   Hyperlipidemia associated with type 2 diabetes mellitus ([place]) 09/17/2010   Type 2 diabetes mellitus with obesity ([place])  09/17/2010    PCP: Marnee Guarneri, NP  REFERRING PROVIDER: April Salama, MD  REFERRING DIAG: I89.0  THERAPY DIAG:  Lymphedema, not elsewhere classified  Rationale for Evaluation and Treatment Rehabilitation  ONSET DATE: 09/10/21  SUBJECTIVE                                                                                                                                                                                           SUBJECTIVE STATEMENT: Sarah [name]  presents to Occupational Therapy for initial lymphedema  treatment visit. Pt states pt states leg pain and swelling are unchanged since initial evaluation. She states she has a pedicure appointment immediately following out session. She presents wearing flip flops and wide legged pants. She does not bring lace up tennis shoes as instructed.   PERTINENT HISTORY:  S/P R plantar foot melanoma 2018 chemotherapy  PAIN:  Are you having pain? Yes: NPRS scale: 5/10 Pain location: RLE Pain description: burning, sore Aggravating factors: standing , walking, dependent sitting Relieving factors: elevation   PRECAUTIONS: Other: lymphedema   PATIENT GOALS reduce leg swelling and associated pain      LYMPHEDEMA ASSESSMENTS:    BLE COMPARATIVE LIMB VOLUMETRICS: T LANDMARK RIGHT  09/30/21  R LEG (A-D)  Initial 4262.7 ml  R THIGH (E-G)   R FULL LIMB (A-G)   Limb Volume differential (LVD)    Volume change since initial   Volume change overall V  (Blank rows = not tested)  LANDMARK LEFT 09/30/21  L LEG (A-D) Initial 3498.0  RLTHIGH (E-G)   L FULL LIMB (A-G)   Limb Volume differential (LVD)   18%, R (dom, Rx) > L  Volume change since initial %  Volume change overall %  (Blank rows = not tested)   G LYMPHEDEMA LIFE IMPACT SCALE (LLIS):TBA OT Rx visit 1  TODAY'S TREATMENT  BLE comparative limb volumetrcs No bandages as Pt does not have needed footwear and has pedicure after OT session   PATIENT EDUCATION:  Education  details: PEmphasis of LE self-care training today on Pt and family edu for initial volumetrics comparison and on gradient compression wrap application at home between sessions.  Person educated: Patient Education method: Explanation, Demonstration, Tactile cues, Verbal cues, and Handouts Education comprehension: verbalized understanding, returned demonstration, verbal cues required, tactile cues required, and needs further education   Lymphedema Self-Care HOME PROGRAM: Simple self-MLD Lymphatic pumping there ex Skin care Compression   ASSESSMENT:  CLINICAL IMPRESSION: Initial BLE comparative limb volumetrics reveal an 18% limb volume differential (LVD) R>L. Unable to apply initial knee length compression wraps today as Pt did not bring recommended shoes and has a pedicure planned immediately following OT session.    OBJECTIVE IMPAIRMENTS Abnormal gait, decreased activity tolerance, decreased balance, decreased knowledge of condition, decreased knowledge of use of DME, decreased mobility, decreased ROM, increased edema, impaired flexibility, impaired sensation, and pain, chronic , progressive leg swelling  ACTIVITY LIMITATIONS  ADLs, productive activities, leisure pursuits, social participation   PARTICIPATION LIMITATIONS: cleaning, driving, shopping, community activity, and occupation  PERSONAL FACTORS 1 comorbidity: HTN  are also affecting patient's functional outcome.   REHAB POTENTIAL: Good  CLINICAL DECISION MAKING: Stable/uncomplicated  EVALUATION COMPLEXITY: Low   GOALS: Goals reviewed with patient? Yes  LONG TERM GOALS: Target date: 12/24/2021  (Remove Blue Hyperlink)  Given this patient's Intake score of TBA visit 1 on the Lymphedema Life Impact Scale (LLIS), patient will experience an increase of 10 % reduction in her perceived level of functional impairment resulting from  lymphedema to improve functional performance and quality of life  Baseline:Max A Goal status:  INITIAL  2.   Pt will demonstrate understanding of lymphedema precautions and prevention strategies with modified independence using a printed reference to identify at least 5 precautions and discussing how s/he may implement them into daily life to reduce risk of progression and to limit infection risk. Baseline: Max A Goal status: INITIAL  3.  Given this patient's Intake score of 60/100% on the functional outcomes FOTO tool, patient will experience  an increase in function of 5 points to improve basic and instrumental ADLs performance, including lymphedema self-care. Baseline: 60/100% Goal status: INITIAL  4.  Pt presents with limited knowledge of lymphedema etiology, treatment protocol and precautions. After skilled education Pt will verbalize understanding of lymphedema diagnosis, treatment and essential self-care routines to ensure optimal, long term self-management and to limit progression. Baseline: Max A Goal status: INITIAL  5.   Pt will demonstrate understanding of lymphedema precautions and prevention strategies with modified independence using a printed reference to identify at least 5 precautions and discussing how s/he may implement them into daily life to reduce risk of progression and to limit infection risk. Baseline: Max A Goal status: INITIAL  6.  With modified independence (extra time)  Pt will be able to apply multilayer, thigh length, compression wraps using gradient techniques from base of toes to popliteal fossa  to decrease limb volume, to limit infection risk, and to limit lymphedema progression.  Baseline: Dependent Goal status: INITIAL   7.  Pt will achieve at least 10% limb volume reduction below the R knee  to return limb to more normal size and shape,  to increase body symmetry for increased balance, to limit infection risk, to decrease pain, and to limit lymphedema progression. Baseline: Dependent Goal status: INITIAL  8.   Pt will achieve and sustain no less  than 85% compliance with all LE self-care home program components throughout CDT, including modified simple self-MLD, daily skin care and inspection, lymphatic pumping the ex and appropriate compression to limit lymphedema progression and to limit further functional decline. Baseline: Dependent Goal status: INITIAL   PLAN: PT FREQUENCY: 2x/week  PT DURATION: x 12 weeks, and other: PRN  for support during slef-management phase CDT  PLANNED INTERVENTIONS:  Complete Decongestive Therapy (CDT): Including MLD, Therapeutic lymphatic pumping exercises, Manual lymph drainage (MLD), Compression bandaging, then appropriate compression garment / device measurement and fitting, and skin care to limit infection. Therapeutic activity,  Patient/Family education,  DME instructions, Kinesiotaping  PLAN FOR NEXT SESSION: BRLE knee length multilayer compression bandaging- and Pt edu and opportunities to practice.  Andrey Spearman, MS, OTR/L, CLT-LANA 10/01/21 1:01 PM

## 2021-10-02 ENCOUNTER — Encounter: Payer: Self-pay | Admitting: Nurse Practitioner

## 2021-10-02 ENCOUNTER — Ambulatory Visit
Admission: RE | Admit: 2021-10-02 | Discharge: 2021-10-02 | Disposition: A | Discharge status: Home / Self Care | Payer: Medicare Other | Source: Ambulatory Visit | Attending: Nurse Practitioner | Admitting: Nurse Practitioner

## 2021-10-02 DIAGNOSIS — Z1231 Encounter for screening mammogram for malignant neoplasm of breast: Secondary | ICD-10-CM | POA: Diagnosis present

## 2021-10-02 DIAGNOSIS — Z780 Asymptomatic menopausal state: Secondary | ICD-10-CM | POA: Diagnosis present

## 2021-10-02 DIAGNOSIS — M85851 Other specified disorders of bone density and structure, right thigh: Secondary | ICD-10-CM | POA: Insufficient documentation

## 2021-10-02 NOTE — Progress Notes (Signed)
Contacted via [place]   Your bone density shows thinning bones (osteopenia) but not brittle (osteoporosis). We recommend Vitamin D supplementation of about 2,0000 IUs of over the counter Vitamin D3. In addition, we recommend a diet high in calcium with dairy and dark green leafy vegetables. We would like you to get plenty of weight bearing exercises with walking and resistance training such as light weights or resistance bands available with instructions at places such as [employer].  Repeat DEXA in 5 years.  Any questions?

## 2021-10-03 NOTE — Progress Notes (Signed)
Contacted via MyChart   Normal mammogram, may repeat in one year:)

## 2021-10-07 ENCOUNTER — Ambulatory Visit: Payer: Medicare Other | Admitting: Occupational Therapy

## 2021-10-07 DIAGNOSIS — I890 Lymphedema, not elsewhere classified: Secondary | ICD-10-CM

## 2021-10-07 NOTE — Therapy (Signed)
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[place])    Adrenal insufficiency ([place])    Anxiety    B12 deficiency    Cancer ([place]) 2018-2019   melanoma   Cervical radiculitis    Constipation    Dependent edema    Depression    Diabetes mellitus    PRE,NO MEDICATIONS AS OF 03/05/21   Dysmenorrhea    H/O: suicide attempt    History of antineoplastic chemotherapy    History of chemotherapy    Hyperlipidemia    Hypertension    Insomnia    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[place])    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)    PMS (premenstrual syndrome)    Past Surgical History:  Procedure Laterality Date   BREAST BIOPSY Right    benign   CHOLECYSTECTOMY     COLONOSCOPY  11/08/2018   Danis-polyps   COLONOSCOPY  12/05/2019   Danis   DILATION AND CURETTAGE OF UTERUS     FOOT SURGERY Right 2018   removal of melanoma   LUMBAR FUSION     lymph node removal Right 2018   groin    POLYPECTOMY     TOTAL ABDOMINAL HYSTERECTOMY W/ BILATERAL SALPINGOOPHORECTOMY     TUBAL LIGATION     Patient Active Problem List   Diagnosis Date Noted   Osteopenia of neck of right femur 10/02/2021   Cervical radiculitis 07/11/2021   Vitamin D deficiency 02/05/2021   Adrenal insufficiency (Addison's disease) ([place]) 02/04/2021   Chronic lower back pain 02/02/2021   Insomnia due to other mental disorder 02/02/2021   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   B12 deficiency 02/01/2018   History of antineoplastic chemotherapy 10/17/2017   Prolonged Q-T interval on ECG 09/04/2017   History of malignant melanoma 01/07/2017   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[place]) 12/26/2011   Hypertension associated with diabetes ([place]) 09/17/2010   Hyperlipidemia associated with type 2 diabetes mellitus ([place]) 09/17/2010    Type 2 diabetes mellitus with obesity ([place]) 09/17/2010    PCP: Sarah Guarneri, NP  REFERRING PROVIDER: April Salama, MD  REFERRING DIAG: I89.0  THERAPY DIAG:  Lymphedema, not elsewhere classified  Rationale for Evaluation and Treatment Rehabilitation  ONSET DATE: 09/10/21  SUBJECTIVE                                                                                                                                                                                           SUBJECTIVE STATEMENT: Sarah [name]  [place]  presents to Occupational Therapy for treatment visit. Pt denies LE-related leg pain. She wears recommended lace up style tennis shoes to clinic today.    PERTINENT HISTORY:  S/P R plantar foot melanoma 2018 chemotherapy  PAIN:  Are you having pain? Yes: NPRS scale: 0/10 Pain location: RLE Pain description: burning, sore Aggravating factors: standing , walking, dependent sitting Relieving factors: elevation   PRECAUTIONS: Other: lymphedema   PATIENT GOALS reduce leg swelling and associated pain      LYMPHEDEMA ASSESSMENTS:    BLE COMPARATIVE LIMB VOLUMETRICS: T LANDMARK RIGHT  09/30/21  R LEG (A-D)  Initial 4262.7 ml  R THIGH (E-G)   R FULL LIMB (A-G)   Limb Volume differential (LVD)    Volume change since initial   Volume change overall V  (Blank rows = not tested)  LANDMARK LEFT 09/30/21  L LEG (A-D) Initial 3498.0  RLTHIGH (E-G)   L FULL LIMB (A-G)   Limb Volume differential (LVD)   18%, R (dom, Rx) > L  Volume change since initial %  Volume change overall %  (Blank rows = not tested)   G LYMPHEDEMA LIFE IMPACT SCALE (LLIS):TBA OT Rx visit 1  TODAY'S TREATMENT  BLE comparative limb volumetrcs No bandages as Pt does not have needed footwear and has pedicure after OT session   PATIENT EDUCATION:  Education details: PEmphasis of LE self-care training today on Pt and family edu for initial volumetrics comparison and on gradient compression wrap  application at home between sessions.  Person educated: Patient Education method: Explanation, Demonstration, Tactile cues, Verbal cues, and Handouts Education comprehension: verbalized understanding, returned demonstration, verbal cues required, tactile cues required, and needs further education   Lymphedema Self-Care HOME PROGRAM: Simple self-MLD Lymphatic pumping there ex Skin care Compression   ASSESSMENT:  CLINICAL IMPRESSION: Initial BLE comparative limb volumetrics reveal an 18% limb volume differential (LVD) R>L. Unable to apply initial knee length compression wraps today as Pt did not bring recommended shoes and has a pedicure planned immediately following OT session.    OBJECTIVE IMPAIRMENTS Abnormal gait, decreased activity tolerance, decreased balance, decreased knowledge of condition, decreased knowledge of use of DME, decreased mobility, decreased ROM, increased edema, impaired flexibility, impaired sensation, and pain, chronic , progressive leg swelling  ACTIVITY LIMITATIONS  ADLs, productive activities, leisure pursuits, social participation   PARTICIPATION LIMITATIONS: cleaning, driving, shopping, community activity, and occupation  PERSONAL FACTORS 1 comorbidity: HTN  are also affecting patient's functional outcome.   REHAB POTENTIAL: Good  CLINICAL DECISION MAKING: Stable/uncomplicated  EVALUATION COMPLEXITY: Low   GOALS: Goals reviewed with patient? Yes  LONG TERM GOALS: Target date: 12/30/2021  (Remove Blue Hyperlink)  Given this patient's Intake score of TBA visit 1 on the Lymphedema Life Impact Scale (LLIS), patient will experience an increase of 10 % reduction in her perceived level of functional impairment resulting from  lymphedema to improve functional performance and quality of life  Baseline:Max A Goal status: INITIAL  2.   Pt will demonstrate understanding of lymphedema precautions and prevention strategies with modified independence using a  printed reference to identify at least 5 precautions and discussing how s/he may implement them into daily life to reduce risk of progression and to limit infection risk. Baseline: Max A Goal status: INITIAL  3.  Given this patient's Intake score of 60/100% on the functional outcomes FOTO tool, patient will experience an increase in function of 5 points to improve basic and instrumental ADLs performance, including lymphedema self-care. Baseline: 60/100%  Goal status: INITIAL  4.  Pt presents with limited knowledge of lymphedema etiology, treatment protocol and precautions. After skilled education Pt will verbalize understanding of lymphedema diagnosis, treatment and essential self-care routines to ensure optimal, long term self-management and to limit progression. Baseline: Max A Goal status: INITIAL  5.   Pt will demonstrate understanding of lymphedema precautions and prevention strategies with modified independence using a printed reference to identify at least 5 precautions and discussing how s/he may implement them into daily life to reduce risk of progression and to limit infection risk. Baseline: Max A Goal status: INITIAL  6.  With modified independence (extra time)  Pt will be able to apply multilayer, thigh length, compression wraps using gradient techniques from base of toes to popliteal fossa  to decrease limb volume, to limit infection risk, and to limit lymphedema progression.  Baseline: Dependent Goal status: INITIAL   7.  Pt will achieve at least 10% limb volume reduction below the R knee  to return limb to more normal size and shape,  to increase body symmetry for increased balance, to limit infection risk, to decrease pain, and to limit lymphedema progression. Baseline: Dependent Goal status: INITIAL  8.   Pt will achieve and sustain no less than 85% compliance with all LE self-care home program components throughout CDT, including modified simple self-MLD, daily skin care  and inspection, lymphatic pumping the ex and appropriate compression to limit lymphedema progression and to limit further functional decline. Baseline: Dependent Goal status: INITIAL   PLAN: PT FREQUENCY: 2x/week  PT DURATION: x 12 weeks, and other: PRN  for support during slef-management phase CDT  PLANNED INTERVENTIONS:  Complete Decongestive Therapy (CDT): Including MLD, Therapeutic lymphatic pumping exercises, Manual lymph drainage (MLD), Compression bandaging, then appropriate compression garment / device measurement and fitting, and skin care to limit infection. Therapeutic activity,  Patient/Family education,  DME instructions, Kinesiotaping  PLAN FOR NEXT SESSION: BRLE knee length multilayer compression bandaging- and Pt edu and opportunities to practice.  Andrey Spearman, MS, OTR/L, CLT-[facility] 10/07/21 1:57 PM

## 2021-10-10 ENCOUNTER — Ambulatory Visit: Payer: Medicare Other | Admitting: Occupational Therapy

## 2021-10-13 ENCOUNTER — Encounter: Payer: Self-pay | Admitting: Behavioral Health

## 2021-10-13 ENCOUNTER — Ambulatory Visit (INDEPENDENT_AMBULATORY_CARE_PROVIDER_SITE_OTHER): Payer: Medicare Other | Admitting: Behavioral Health

## 2021-10-13 DIAGNOSIS — F411 Generalized anxiety disorder: Secondary | ICD-10-CM | POA: Diagnosis not present

## 2021-10-13 DIAGNOSIS — F5105 Insomnia due to other mental disorder: Secondary | ICD-10-CM

## 2021-10-13 DIAGNOSIS — F3341 Major depressive disorder, recurrent, in partial remission: Secondary | ICD-10-CM

## 2021-10-13 DIAGNOSIS — F99 Mental disorder, not otherwise specified: Secondary | ICD-10-CM | POA: Diagnosis not present

## 2021-10-13 MED ORDER — ESCITALOPRAM OXALATE 10 MG PO TABS: 10.0000 mg | ORAL_TABLET | Freq: Every day | ORAL | 3 refills | Status: DC

## 2021-10-13 MED ORDER — BELSOMRA 20 MG PO TABS: ORAL_TABLET | ORAL | 3 refills | Status: DC

## 2021-10-13 NOTE — Progress Notes (Signed)
[facility] Check  Patient ID: Sarah [name],  MRN: [id number]  PCP: Venita Lick, NP  Date of Evaluation: 10/13/2021 Time spent:30 minutes  Chief Complaint:  Chief Complaint   Anxiety; Depression; Medication Refill; Follow-up; Patient Education     HISTORY/CURRENT STATUS: HPI  71 year old female presents to this office for follow up and medication management.  No changes this visit. She says that she continues to feel stable with Lexapro and does not need any changes to her medications at this time.  Says that she has not had any SI in years and feels safe at this time. She was receiving treatment for malignant melanoma and mental health care at [facility] until recently when they could not longer see outpatient clients. She is now living in a retirement community. She reports her anxiety at 1/10 and depression 1/10. She is sleeping well at 7-8 hours at night with aid of Ambien but would like to try a medication that is deemed safer for 65 and older. Denies mania, no psychosis. No SI/HI.    No previous medication failures noted this visit.          Individual Medical History/ Review of Systems: Changes? :No   Allergies: Doxycycline, Ciprofloxacin, and Penicillins  Current Medications:  Current Outpatient Medications:    amitriptyline (ELAVIL) 50 MG tablet, Take 50 mg by mouth at bedtime., Disp: , Rfl:    amLODipine (NORVASC) 2.5 MG tablet, Take 1 tablet (2.5 mg total) by mouth daily., Disp: 90 tablet, Rfl: 4   Cholecalciferol (VITAMIN D) 50 MCG (2000 UT) CAPS, Take by mouth., Disp: , Rfl:    Cyanocobalamin (VITAMIN B-12 PO), Take by mouth daily., Disp: , Rfl:    escitalopram (LEXAPRO) 10 MG tablet, Take 1 tablet (10 mg total) by mouth daily., Disp: 30 tablet, Rfl: 3   furosemide (LASIX) 40 MG tablet, Take 1 tablet (40 mg total) by mouth daily., Disp: 90 tablet, Rfl: 4   HYDROcodone-acetaminophen (NORCO/VICODIN) 5-325 MG tablet, Take 1 tablet by mouth every 6 (six)  hours as needed for severe pain., Disp: 5 tablet, Rfl: 0   hydrocortisone (CORTEF) 10 MG tablet, 10 mg daily. 15 mg in the am and 5 mg in the pm, Disp: , Rfl: 3   losartan (COZAAR) 50 MG tablet, Take 1 tablet (50 mg total) by mouth daily., Disp: 90 tablet, Rfl: 4   meclizine (ANTIVERT) 25 MG tablet, Take 1 tablet (25 mg total) by mouth 3 (three) times daily as needed for dizziness., Disp: 30 tablet, Rfl: 0   pregabalin (LYRICA) 150 MG capsule, 150 mg in am and 300 mg in pm, Disp: , Rfl:    rosuvastatin (CRESTOR) 10 MG tablet, Take 1 tablet (10 mg total) by mouth daily., Disp: 90 tablet, Rfl: 3   Suvorexant (BELSOMRA) 20 MG TABS, TAKE 1 TABLET BY MOUTH EVERYDAY AT BEDTIME, Disp: 30 tablet, Rfl: 3 Medication Side Effects: none  Family Medical/ Social History: Changes? No  MENTAL HEALTH EXAM:  There were no vitals taken for this visit.There is no height or weight on file to calculate BMI.  General Appearance: Casual, Neat, and Well Groomed  Eye Contact:  Good  Speech:  Clear and Coherent  Volume:  Normal  Mood:  Anxious, Depressed, and Dysphoric  Affect:  Appropriate and Congruent  Thought Process:  Coherent  Orientation:  Full (Time, Place, and Person)  Thought Content: Logical   Suicidal Thoughts:  No  Homicidal Thoughts:  No  Memory:  WNL  Judgement:  Good  Insight:  Good  Psychomotor Activity:  Normal  Concentration:  Concentration: Good  Recall:  Good  Fund of Knowledge: Good  Language: Good  Assets:  Desire for Improvement  ADL's:  Intact  Cognition: WNL  Prognosis:  Good    DIAGNOSES:    ICD-10-CM   1. Insomnia due to other mental disorder  F51.05 Suvorexant (BELSOMRA) 20 MG TABS   F99     2. Generalized anxiety disorder  F41.1 escitalopram (LEXAPRO) 10 MG tablet    3. Recurrent major depressive disorder, in partial remission (HCC)  F33.41 escitalopram (LEXAPRO) 10 MG tablet      Receiving Psychotherapy: No    RECOMMENDATIONS:   Greater than 50% of 30 min   face to face time with patient was spent on counseling and coordination of care. No changes this visit.  We discussed her current stability with her current medications. She is reporting very low levels of anxiety and depression with Lexapro. No changes are indicated at this time. Reinforced documented previous and current flagged warning of  QT prolongation and risk with Lexapro. She has been on the medication as reported by Pt since 2018.    To continue Lexapro 10 mg daily To continue Zolpidem 5 mg at bedtime To continue Amitriptyline 50 mg at bedtime Provided  #6, Belsomra 10 mg tablets to try at bedtime. Will not take together with Ambien. Will report any worsening symptoms promptly To follow up in 3 months to reassess per pt request Provided emergency contact information Reviewed [place], NP

## 2021-10-14 ENCOUNTER — Ambulatory Visit: Payer: Medicare Other | Admitting: Occupational Therapy

## 2021-10-14 DIAGNOSIS — I890 Lymphedema, not elsewhere classified: Secondary | ICD-10-CM

## 2021-10-15 NOTE — Therapy (Signed)
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[place])    Adrenal insufficiency ([place])    Anxiety    B12 deficiency    Cancer ([place]) 2018-2019   melanoma   Cervical radiculitis    Constipation    Dependent edema    Depression    Diabetes mellitus    PRE,NO MEDICATIONS AS OF 03/05/21   Dysmenorrhea    H/O: suicide attempt    History of antineoplastic chemotherapy    History of chemotherapy    Hyperlipidemia    Hypertension    Insomnia    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[place])    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)    PMS (premenstrual syndrome)    Past Surgical History:  Procedure Laterality Date   BREAST BIOPSY Right    benign   CHOLECYSTECTOMY     COLONOSCOPY  11/08/2018   Danis-polyps   COLONOSCOPY  12/05/2019   Danis   DILATION AND CURETTAGE OF UTERUS     FOOT SURGERY Right 2018   removal of melanoma   LUMBAR FUSION     lymph node removal Right 2018   groin    POLYPECTOMY     TOTAL ABDOMINAL HYSTERECTOMY W/ BILATERAL SALPINGOOPHORECTOMY     TUBAL LIGATION     Patient Active Problem List   Diagnosis Date Noted   Osteopenia of neck of right femur 10/02/2021   Cervical radiculitis 07/11/2021   Vitamin D deficiency 02/05/2021   Adrenal insufficiency (Addison's disease) ([place]) 02/04/2021   Chronic lower back pain 02/02/2021   Insomnia due to other mental disorder 02/02/2021   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   B12 deficiency 02/01/2018   History of antineoplastic chemotherapy 10/17/2017   Prolonged Q-T interval on ECG 09/04/2017   History of malignant melanoma 01/07/2017   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 Pt denies LE-related leg pain. Pt arrives with RLE in compression wraps which she applied several times during visit interval. "It's not pretty, but I stayed wrapped. I was able to do it."   PERTINENT HISTORY:  S/P R plantar melanoma 2018 chemotherapy  PAIN:  Are you having pain? NO: NPRS scale: 0/10 Pain location: RLE Pain description: burning, sore Aggravating factors: standing , walking, dependent sitting Relieving factors: elevation   PRECAUTIONS: Other: lymphedema   PATIENT GOALS reduce leg swelling and associated pain   LYMPHEDEMA ASSESSMENTS:   BLE COMPARATIVE LIMB VOLUMETRICS: T LANDMARK RIGHT  09/30/21  R LEG (A-D)  Initial 4262.7 ml  R THIGH (E-G)   R FULL LIMB (A-G)   Limb Volume differential (LVD)    Volume change since initial   Volume change overall V  (Blank rows = not tested)  LANDMARK LEFT 09/30/21  L LEG (A-D) Initial 3498.0  RLTHIGH (E-G)   L FULL LIMB (A-G)   Limb Volume differential (LVD)   18%, R (dom, Rx) > L  Volume change since initial %  Volume change overall %  (Blank rows = not tested)    LYMPHEDEMA LIFE IMPACT SCALE (LLIS): 10/13/21 72.06% (extent to which lymphedema-associated problems affected your daily life over the past week)  FOTO functional outcome score: Intake 09/10/21 60/100%  TODAY'S TREATMENT  MLD to RLE utilizing short neck sequence,  diaphragmatic breathing, functional inguinal LN and proximal to distal J strokes from thigh to foot. Fibrosis techniques to dorsal foot.  Skin care throughout MLD using low ph castor oil to increase skin excursion and improve hydration to limit infection risk Multilayer RLE compression wraps using short stretch bandages over Rosidal foam from base of toes to popliteal fossa. Gradient pattern.  PATIENT EDUCATION:  Education details: Emphasis of LE self-care training on multi layer compression wrap application using gradient techniques.  Person educated: Patient Education method: Explanation, Demonstration, Tactile cues, Verbal cues, and Handouts Education comprehension: verbalized understanding, returned demonstration, verbal cues required, tactile cues required, and needs further education   Lymphedema Self-Care HOME PROGRAM: Simple self-MLD Lymphatic pumping there ex Skin care Compression   ASSESSMENT:  CLINICAL IMPRESSION: Pt has mastered gradient compression wrapping. Technique is not perfect, but it has been effective at reducing limb volume during visit interval. Pt tolerated MLD to RLE/RLQ without increased pain. Decreased tissue density noted in R leg and foot after manual therapy. Issued 2nd set of compression wraps and applied as established. Pt demonstrates quick Rx response. Cont as per POC.  OBJECTIVE IMPAIRMENTS Abnormal gait, decreased activity tolerance, decreased balance, decreased knowledge of condition, decreased knowledge of use of DME, decreased mobility, decreased ROM, increased edema, impaired flexibility, impaired sensation, and pain, chronic , progressive leg swelling  ACTIVITY LIMITATIONS  ADLs, productive activities, leisure pursuits, social participation   PARTICIPATION LIMITATIONS: cleaning, driving, shopping, community activity, and occupation  PERSONAL FACTORS 1 comorbidity: HTN  are also affecting patient's functional outcome.   REHAB POTENTIAL:  Good  CLINICAL DECISION MAKING: Stable/uncomplicated  EVALUATION COMPLEXITY: Low   GOALS: Goals reviewed with patient? Yes  LONG TERM GOALS: Target date: 01/07/2022  (Remove Blue Hyperlink)  Given this patient's Intake score of 72.06 % on the Lymphedema Life Impact Scale (LLIS), patient will experience an increase of 10 % reduction in her perceived level of functional impairment resulting from  lymphedema to improve functional performance and quality of life  Baseline:Max A Goal status: INITIAL  2.   Pt will demonstrate understanding of lymphedema precautions and prevention strategies with modified independence using a printed reference to identify at least 5 precautions and discussing how s/he may implement them into daily life to reduce risk of progression and to limit infection risk. Baseline: Max A Goal status: INITIAL  3.  Given this patient's Intake score of 60/100% on the functional outcomes FOTO tool, patient will experience an increase in function of 5 points to improve basic and instrumental ADLs performance, including lymphedema self-care. Baseline: 60/100% Goal status: INITIAL  4.  Pt presents with limited knowledge of lymphedema etiology, treatment protocol and precautions. After skilled education Pt will verbalize understanding of lymphedema diagnosis, treatment and essential self-care routines to ensure optimal, long term self-management and to limit progression. Baseline: Max A Goal status: INITIAL  5.   Pt will demonstrate understanding of lymphedema precautions and prevention strategies with modified independence using a printed reference to identify at least 5 precautions and discussing how s/he may implement them into daily life to reduce risk of progression and to limit infection risk. Baseline: Max A Goal status: INITIAL  6.  With modified independence (extra time)  Pt will be able to apply multilayer, thigh length, compression wraps using gradient techniques  from base of toes to popliteal fossa  to decrease limb volume, to limit infection risk, and to limit lymphedema progression.  Baseline: Dependent Goal status: 10/13/21 GOAL MET   7.  Pt will achieve at least 10% limb volume reduction below the R knee  to return limb to more normal size and shape,  to increase body symmetry for increased balance, to limit infection risk, to decrease pain, and to limit lymphedema progression. Baseline: Dependent Goal status: INITIAL  8.   Pt will achieve and sustain no less than 85% compliance with all LE self-care home program components throughout CDT, including modified simple self-MLD, daily skin care and inspection, lymphatic pumping the ex and appropriate compression to limit lymphedema progression and to limit further functional decline. Baseline: Dependent Goal status: INITIAL   PLAN: PT FREQUENCY: 2x/week  PT DURATION: x 12 weeks, and other: PRN  for support during slef-management phase CDT  PLANNED INTERVENTIONS:  Complete Decongestive Therapy (CDT): Including MLD, Therapeutic lymphatic pumping exercises, Manual lymph drainage (MLD), Compression bandaging, then appropriate compression garment / device measurement and fitting, and skin care to limit infection. Therapeutic activity,  Patient/Family education,  DME instructions, Kinesiotaping  PLAN FOR NEXT SESSION: BRLE knee length multilayer compression bandaging- and Pt edu and opportunities to practice.  Andrey Spearman, MS, OTR/L, [facility] 10/15/21 8:47 AM

## 2021-10-20 ENCOUNTER — Ambulatory Visit: Payer: Medicare Other | Admitting: Occupational Therapy

## 2021-10-20 ENCOUNTER — Ambulatory Visit: Payer: Self-pay

## 2021-10-20 NOTE — Telephone Encounter (Signed)
  Chief Complaint: UTI Symptoms: low abdominal and back pain, odor, frequency and urgency Frequency: since last Thursday 10/16/21 Pertinent Negatives: NA Disposition: '[]'$ ED /'[]'$ Urgent Care (no appt availability in office) / '[x]'$ Appointment(In office/virtual)/ '[]'$  [place] [facility]/ '[]'$ Home Care/ '[]'$ Refused Recommended Disposition /'[]'$ [facility]/ '[]'$  Follow-up with PCP Additional Notes: pt has been forcing fluids and cranberry juice plus yogurt but no improvement to sx. Scheduled pt for VV tomorrow at 1300 with Henrine Screws, NP. Pt is able to come to practice to provide UA if needed.   Summary: Pt states UTI no appt available   Pt wants fu call, states thinks as UTI, [name] PCP is sick, No appt available for today, Pt wants advice on what to do. [phone] states nurse call maybe ok. [phone]      Reason for Disposition  Side (flank) or lower back pain present  Answer Assessment - Initial Assessment Questions 1. SYMPTOM: "What's the main symptom you're concerned about?" (e.g., frequency, incontinence)     Possible UTI  2. ONSET: "When did the  sx  start?"     Since last Thursday  5. OTHER SYMPTOMS: "Do you have any other symptoms?" (e.g., blood in urine, fever, flank pain, pain with urination)     Low abdominal and back pain, odor, frequency, urgency  Protocols used: Urinary Symptoms-A-AH

## 2021-10-21 ENCOUNTER — Telehealth (INDEPENDENT_AMBULATORY_CARE_PROVIDER_SITE_OTHER): Payer: Medicare Other | Admitting: Nurse Practitioner

## 2021-10-21 ENCOUNTER — Encounter: Payer: Self-pay | Admitting: Nurse Practitioner

## 2021-10-21 DIAGNOSIS — R8281 Pyuria: Secondary | ICD-10-CM | POA: Diagnosis not present

## 2021-10-21 DIAGNOSIS — R399 Unspecified symptoms and signs involving the genitourinary system: Secondary | ICD-10-CM | POA: Diagnosis not present

## 2021-10-21 LAB — URINALYSIS, ROUTINE W REFLEX MICROSCOPIC
Bilirubin, UA: NEGATIVE
Glucose, UA: NEGATIVE
Ketones, UA: NEGATIVE
Nitrite, UA: NEGATIVE
Protein,UA: NEGATIVE
RBC, UA: NEGATIVE
Specific Gravity, UA: 1.015 (ref 1.005–1.030)
Urobilinogen, Ur: 0.2 mg/dL (ref 0.2–1.0)
pH, UA: 5.5 (ref 5.0–7.5)

## 2021-10-21 LAB — MICROSCOPIC EXAMINATION
Bacteria, UA: NONE SEEN
Epithelial Cells (non renal): NONE SEEN /hpf (ref 0–10)

## 2021-10-21 MED ORDER — NITROFURANTOIN MONOHYD MACRO 100 MG PO CAPS: 100.0000 mg | ORAL_CAPSULE | Freq: Two times a day (BID) | ORAL | 0 refills | Status: AC

## 2021-10-21 NOTE — Progress Notes (Signed)
Contacted via MyChart   Urine has some leukocytes in it, but no other findings,  However, I am concerned based on your symptoms so have sent in [place] (you are allergic to Penicillin I see) and will send this urine for culture.  If no growth or changes needed I will let you know.

## 2021-10-21 NOTE — Patient Instructions (Signed)
Urinary Tract Infection, Adult A urinary tract infection (UTI) is an infection of any part of the urinary tract. The urinary tract includes: The kidneys. The ureters. The bladder. The urethra. These organs make, store, and get rid of pee (urine) in the body. What are the causes? This infection is caused by germs (bacteria) in your genital area. These germs grow and cause swelling (inflammation) of your urinary tract. What increases the risk? The following factors may make you more likely to develop this condition: Using a small, thin tube (catheter) to drain pee. Not being able to control when you pee or poop (incontinence). Being female. If you are female, these things can increase the risk: Using these methods to prevent pregnancy: A medicine that kills sperm (spermicide). A device that blocks sperm (diaphragm). Having low levels of a female hormone (estrogen). Being pregnant. You are more likely to develop this condition if: You have genes that add to your risk. You are sexually active. You take antibiotic medicines. You have trouble peeing because of: A prostate that is bigger than normal, if you are female. A blockage in the part of your body that drains pee from the bladder. A kidney stone. A nerve condition that affects your bladder. Not getting enough to drink. Not peeing often enough. You have other conditions, such as: Diabetes. A weak disease-fighting system (immune system). Sickle cell disease. Gout. Injury of the spine. What are the signs or symptoms? Symptoms of this condition include: Needing to pee right away. Peeing small amounts often. Pain or burning when peeing. Blood in the pee. Pee that smells bad or not like normal. Trouble peeing. Pee that is cloudy. Fluid coming from the vagina, if you are female. Pain in the belly or lower back. Other symptoms include: Vomiting. Not feeling hungry. Feeling mixed up (confused). This may be the first symptom in  older adults. Being tired and grouchy (irritable). A fever. Watery poop (diarrhea). How is this treated? Taking antibiotic medicine. Taking other medicines. Drinking enough water. In some cases, you may need to see a specialist. Follow these instructions at home:  Medicines Take over-the-counter and prescription medicines only as told by your doctor. If you were prescribed an antibiotic medicine, take it as told by your doctor. Do not stop taking it even if you start to feel better. General instructions Make sure you: Pee until your bladder is empty. Do not hold pee for a long time. Empty your bladder after sex. Wipe from front to back after peeing or pooping if you are a female. Use each tissue one time when you wipe. Drink enough fluid to keep your pee pale yellow. Keep all follow-up visits. Contact a doctor if: You do not get better after 1-2 days. Your symptoms go away and then come back. Get help right away if: You have very bad back pain. You have very bad pain in your lower belly. You have a fever. You have chills. You feeling like you will vomit or you vomit. Summary A urinary tract infection (UTI) is an infection of any part of the urinary tract. This condition is caused by germs in your genital area. There are many risk factors for a UTI. Treatment includes antibiotic medicines. Drink enough fluid to keep your pee pale yellow. This information is not intended to replace advice given to you by your health care provider. Make sure you discuss any questions you have with your health care provider. Document Revised: 08/18/2019 Document Reviewed: 08/18/2019 Elsevier Patient Education    2023 Elsevier Inc.  

## 2021-10-21 NOTE — Assessment & Plan Note (Signed)
Acute for several days.  Discussed with patient, will have her come in and provide urine sample to allow provider to better treat symptoms.  Educated her on various bacteria in urine and how different kinds require different abx therapy, so most appropriate step is to check urine.  UA and culture ordered.  Plan on treatment after review of these.  Patient in agreeance with this plan.

## 2021-10-21 NOTE — Addendum Note (Signed)
Addended by: Marnee Guarneri T on: 10/21/2021 04:07 PM   Modules accepted: Orders

## 2021-10-21 NOTE — Progress Notes (Signed)
There were no vitals taken for this visit.   Subjective:    Patient ID: Sarah [name], female    DOB: 30-Aug-1950, 71 y.o.   MRN: [id number]  HPI: Sarah [name] is a 71 y.o. female  Chief Complaint  Patient presents with   Urinary Tract Infection    Patient is here for a possible UTI. Patient says she is having urinary urgency, dysuria, burning with urination. Patient says her symptoms started last Thursday. Patient says she has been drinking Cranberry and eating yogurt.   This visit was completed via video visit through MyChart due to the restrictions of the COVID-19 pandemic. All issues as above were discussed and addressed. Physical exam was done as above through visual confirmation on video through MyChart. If it was felt that the patient should be evaluated in the office, they were directed there. The patient verbally consented to this visit. Location of the patient: home Location of the provider: home Those involved with this call:  Provider: Marnee Guarneri, DNP CMA: Irena Reichmann, [place] Desk/Registration: [employer]  Time spent on call:  21 minutes with patient face to face via video conference. More than 50% of this time was spent in counseling and coordination of care. 15 minutes total spent in review of patient's record and preparation of their chart.  I verified patient identity using two factors (patient name and date of birth). Patient consents verbally to being seen via telemedicine visit today.    URINARY SYMPTOMS Has had symptoms since Thursday last week.   Dysuria: burning Urinary frequency: yes Urgency: yes Small volume voids: yes Symptom severity: yes Urinary incontinence: no Foul odor: yes Hematuria: no Abdominal pain: no Back pain: yes Suprapubic pain/pressure: yes Flank pain: no Fever:  no Vomiting: no Status: stable Previous urinary tract infection: yes Recurrent urinary tract infection: no Sexual activity: No sexually active History of  sexually transmitted disease: no Treatments attempted: cranberry and increasing fluids    Relevant past medical, surgical, family and social history reviewed and updated as indicated. Interim medical history since our last visit reviewed. Allergies and medications reviewed and updated.  Review of Systems  Constitutional:  Positive for fatigue. Negative for activity change, appetite change, diaphoresis and fever.  Respiratory: Negative.    Cardiovascular: Negative.   Gastrointestinal:  Positive for abdominal pain. Negative for abdominal distention, constipation, diarrhea, nausea and vomiting.  Genitourinary:  Positive for decreased urine volume, dysuria, frequency and urgency. Negative for hematuria and vaginal discharge.  Neurological: Negative.   Psychiatric/Behavioral: Negative.      Per HPI unless specifically indicated above     Objective:    There were no vitals taken for this visit.  Wt Readings from Last 3 Encounters:  07/11/21 224 lb (101.6 kg)  04/25/21 220 lb 3.2 oz (99.9 kg)  03/19/21 201 lb (91.2 kg)    Physical Exam Vitals and nursing note reviewed.  Constitutional:      General: She is awake. She is not in acute distress.    Appearance: She is well-developed. She is not ill-appearing or toxic-appearing.  HENT:     Head: Normocephalic.     Right Ear: Hearing normal.     Left Ear: Hearing normal.  Eyes:     General: Lids are normal.        Right eye: No discharge.        Left eye: No discharge.     Conjunctiva/sclera: Conjunctivae normal.  Pulmonary:     Effort: Pulmonary effort is normal.  No accessory muscle usage or respiratory distress.  Musculoskeletal:     Cervical back: Normal range of motion.  Neurological:     Mental Status: She is alert and oriented to person, place, and time.  Psychiatric:        Attention and Perception: Attention normal.        Mood and Affect: Mood normal.        Behavior: Behavior normal. Behavior is cooperative.         Thought Content: Thought content normal.        Judgment: Judgment normal.     Results for orders placed or performed in visit on 83/15/17  Basic metabolic panel  Result Value Ref Range   Glucose 201 (H) 70 - 99 mg/dL   BUN 17 8 - 27 mg/dL   Creatinine, Ser 1.11 (H) 0.57 - 1.00 mg/dL   eGFR 54 (L) >59 mL/min/1.73   BUN/Creatinine Ratio 15 12 - 28   Sodium 139 134 - 144 mmol/L   Potassium 4.4 3.5 - 5.2 mmol/L   Chloride 102 96 - 106 mmol/L   CO2 20 20 - 29 mmol/L   Calcium 9.1 8.7 - 10.3 mg/dL  Lipid Panel w/o Chol/HDL Ratio  Result Value Ref Range   Cholesterol, Total 154 100 - 199 mg/dL   Triglycerides 185 (H) 0 - 149 mg/dL   HDL 45 >39 mg/dL   VLDL Cholesterol Cal 31 5 - 40 mg/dL   LDL Chol Calc (NIH) 78 0 - 99 mg/dL      Assessment & Plan:   Problem List Items Addressed This Visit       Other   Urinary symptom or sign - Primary    Acute for several days.  Discussed with patient, will have her come in and provide urine sample to allow provider to better treat symptoms.  Educated her on various bacteria in urine and how different kinds require different abx therapy, so most appropriate step is to check urine.  UA and culture ordered.  Plan on treatment after review of these.  Patient in agreeance with this plan.      Relevant Orders   Urinalysis, Routine w reflex microscopic   Urine Culture   Other Visit Diagnoses     Pyuria       Will send urine for culture.   Relevant Orders   Urine Culture       I discussed the assessment and treatment plan with the patient. The patient was provided an opportunity to ask questions and all were answered. The patient agreed with the plan and demonstrated an understanding of the instructions.   The patient was advised to call back or seek an in-person evaluation if the symptoms worsen or if the condition fails to improve as anticipated.   I provided 21+ minutes of time during this encounter.    Follow up plan: Return if  symptoms worsen or fail to improve.

## 2021-10-23 LAB — URINE CULTURE

## 2021-10-23 NOTE — Progress Notes (Signed)
Contacted via MyChart  Good morning Sarah [name], your urine returned and showed no growth.  I recommend stopping antibiotic at this time and if symptoms continue return to office for further assessment.  Any questions? Keep being awesome!!  Thank you for allowing me to participate in your care.  I appreciate you. Kindest regards, Daymion Nazaire

## 2021-10-27 ENCOUNTER — Ambulatory Visit: Payer: Medicare Other | Attending: Internal Medicine | Admitting: Occupational Therapy

## 2021-10-27 ENCOUNTER — Encounter: Payer: Self-pay | Admitting: Nurse Practitioner

## 2021-10-27 DIAGNOSIS — I890 Lymphedema, not elsewhere classified: Secondary | ICD-10-CM | POA: Insufficient documentation

## 2021-10-27 NOTE — Therapy (Signed)
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[place])    Adrenal insufficiency ([place])    Anxiety    B12 deficiency    Cancer ([place]) 2018-2019   melanoma   Cervical radiculitis    Constipation    Dependent edema    Depression    Diabetes mellitus    PRE,NO MEDICATIONS AS OF 03/05/21   Dysmenorrhea    H/O: suicide attempt    History of antineoplastic chemotherapy    History of chemotherapy    Hyperlipidemia    Hypertension    Insomnia    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[place])    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)    PMS (premenstrual syndrome)    Past Surgical History:  Procedure Laterality Date   BREAST BIOPSY Right    benign   CHOLECYSTECTOMY     COLONOSCOPY  11/08/2018   Danis-polyps   COLONOSCOPY  12/05/2019   Danis   DILATION AND CURETTAGE OF UTERUS     FOOT SURGERY Right 2018   removal of melanoma   LUMBAR FUSION     lymph node removal Right 2018   groin    POLYPECTOMY     TOTAL ABDOMINAL HYSTERECTOMY W/ BILATERAL SALPINGOOPHORECTOMY     TUBAL LIGATION     Patient Active Problem List   Diagnosis Date Noted   Urinary symptom or sign 10/21/2021   Osteopenia of neck of right femur 10/02/2021   Cervical radiculitis 07/11/2021   Vitamin D deficiency 02/05/2021   Adrenal insufficiency (Addison's disease) ([place]) 02/04/2021   Chronic lower back pain 02/02/2021   Insomnia due to other mental disorder 02/02/2021   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   B12 deficiency 02/01/2018   History of antineoplastic chemotherapy 10/17/2017   Prolonged Q-T interval on ECG 09/04/2017   History of malignant melanoma 01/07/2017   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 Pt reports LE-related leg pain 6/10.  Pt arrives with RLE in compression wraps  in place.She missed last session due to illness. She tells me she also has quite a bit of back pain on the left today that started ~ 2 weeks ago. OT urged Pt to schedule an appointment with her doctor. Moist heat pack to area throughout session provided mild relief.   PERTINENT HISTORY:  S/P R plantar melanoma 2018 chemotherapy  PAIN:  Are you having pain? NO: NPRS scale: 6/10 Pain location: RLE Pain description: burning, sore Aggravating factors: standing , walking, dependent sitting Relieving factors: elevation   PRECAUTIONS: Other: lymphedema   PATIENT GOALS reduce leg swelling and associated pain   LYMPHEDEMA ASSESSMENTS:   BLE COMPARATIVE LIMB VOLUMETRICS: T LANDMARK RIGHT  09/30/21  R LEG (A-D)  Initial 4262.7 ml  R THIGH (E-G)   R FULL LIMB (A-G)   Limb Volume differential (LVD)    Volume change since initial   Volume change overall V  (Blank rows = not tested)  LANDMARK LEFT 09/30/21  L LEG (A-D) Initial 3498.0  RLTHIGH (E-G)   L FULL LIMB (A-G)   Limb Volume differential (LVD)   18%, R (dom, Rx) > L  Volume change since initial %  Volume change overall %  (Blank rows = not tested)    LYMPHEDEMA LIFE IMPACT SCALE (LLIS): 10/13/21 72.06% (extent to which  lymphedema-associated problems affected your daily life over the past week)  FOTO functional outcome score: Intake 09/10/21 60/100%  TODAY'S TREATMENT  MLD to RLE utilizing short neck sequence, diaphragmatic breathing, functional inguinal LN and proximal to distal J strokes from thigh to foot. Fibrosis techniques to dorsal foot.  Skin care throughout MLD using low ph castor oil to increase skin excursion and improve hydration to limit infection risk Multilayer RLE compression wraps using short stretch bandages over Rosidal foam from base of toes to popliteal fossa. Gradient pattern.  PATIENT EDUCATION:  Education details: Continued Pt/ CG edu for lymphedema self care home program throughout session. Topics include outcome of comparative limb volumetrics- starting limb volume differentials (LVDs), technology and gradient techniques used for short stretch, multilayer compression wrapping, simple self-MLD, therapeutic lymphatic pumping exercises, skin/nail care, LE precautions,. compression garment recommendations and specifications, wear and care schedule and compression garment donning / doffing w assistive devices. Discussed progress towards all OT goals since commencing CDT. All questions answered to the Pt's satisfaction. Good return. Person educated: Patient Education method: Explanation, Demonstration, Tactile cues, Verbal cues, and Handouts Education comprehension: verbalized understanding, returned demonstration, verbal cues required, tactile cues required, and needs further education   Lymphedema Self-Care HOME PROGRAM: Simple self-MLD Lymphatic pumping there ex Skin care Compression   ASSESSMENT:  CLINICAL IMPRESSION: Despite being ill and missing a session , Pt has done a really good job with compression wraps during session interval. Pt reports less than 100% compliance with LE self care, but elevation in bed provided anti gravity positioning that reduced impact of gravity on  distal swelling. Pt tolerated MLD to RLE/RLQ without increased pain. Decreased tissue density noted in R leg and foot after manual therapy.Due to quick Rx responsewe'll complete RLE compression garment measurements next session.Considering fitting with Juzo ccl 2 SOFT knee highs (30-40 mmHg). Cont as per POC.  OBJECTIVE IMPAIRMENTS Abnormal gait, decreased activity tolerance, decreased balance, decreased knowledge of condition, decreased knowledge of use of DME, decreased mobility, decreased ROM, increased edema, impaired  flexibility, impaired sensation, and pain, chronic , progressive leg swelling  ACTIVITY LIMITATIONS  ADLs, productive activities, leisure pursuits, social participation   PARTICIPATION LIMITATIONS: cleaning, driving, shopping, community activity, and occupation  PERSONAL FACTORS 1 comorbidity: HTN  are also affecting patient's functional outcome.   REHAB POTENTIAL: Good  CLINICAL DECISION MAKING: Stable/uncomplicated  EVALUATION COMPLEXITY: Low   GOALS: Goals reviewed with patient? Yes  LONG TERM GOALS: Target date: 01/19/2022  (Remove Blue Hyperlink)  Given this patient's Intake score of 72.06 % on the Lymphedema Life Impact Scale (LLIS), patient will experience an increase of 10 % reduction in her perceived level of functional impairment resulting from  lymphedema to improve functional performance and quality of life  Baseline:Max A Goal status: INITIAL  2.   Pt will demonstrate understanding of lymphedema precautions and prevention strategies with modified independence using a printed reference to identify at least 5 precautions and discussing how s/he may implement them into daily life to reduce risk of progression and to limit infection risk. Baseline: Max A Goal status: INITIAL  3.  Given this patient's Intake score of 60/100% on the functional outcomes FOTO tool, patient will experience an increase in function of 5 points to improve basic and instrumental ADLs  performance, including lymphedema self-care. Baseline: 60/100% Goal status: INITIAL  4.  Pt presents with limited knowledge of lymphedema etiology, treatment protocol and precautions. After skilled education Pt will verbalize understanding of lymphedema diagnosis, treatment and essential self-care routines to ensure optimal, long term self-management and to limit progression. Baseline: Max A Goal status: INITIAL  5.   Pt will demonstrate understanding of lymphedema precautions and prevention strategies with modified independence using a printed reference to identify at least 5 precautions and discussing how s/he may implement them into daily life to reduce risk of progression and to limit infection risk. Baseline: Max A Goal status: INITIAL  6.  With modified independence (extra time)  Pt will be able to apply multilayer, thigh length, compression wraps using gradient techniques from base of toes to popliteal fossa  to decrease limb volume, to limit infection risk, and to limit lymphedema progression.  Baseline: Dependent Goal status: 10/13/21 GOAL MET   7.  Pt will achieve at least 10% limb volume reduction below the R knee  to return limb to more normal size and shape,  to increase body symmetry for increased balance, to limit infection risk, to decrease pain, and to limit lymphedema progression. Baseline: Dependent Goal status: INITIAL  8.   Pt will achieve and sustain no less than 85% compliance with all LE self-care home program components throughout CDT, including modified simple self-MLD, daily skin care and inspection, lymphatic pumping the ex and appropriate compression to limit lymphedema progression and to limit further functional decline. Baseline: Dependent Goal status: INITIAL   PLAN: PT FREQUENCY: 2x/week  PT DURATION: x 12 weeks, and other: PRN  for support during slef-management phase CDT  PLANNED INTERVENTIONS:  Complete Decongestive Therapy (CDT): Including MLD,  Therapeutic lymphatic pumping exercises, Manual lymph drainage (MLD), Compression bandaging, then appropriate compression garment / device measurement and fitting, and skin care to limit infection. Therapeutic activity,  Patient/Family education,  DME instructions, Kinesiotaping  PLAN FOR NEXT SESSION: BRLE knee length multilayer compression bandaging- and Pt edu and opportunities to practice.  Andrey Spearman, MS, OTR/L, [facility] 10/27/21 3:43 PM

## 2021-10-29 ENCOUNTER — Other Ambulatory Visit: Payer: Self-pay | Admitting: Behavioral Health

## 2021-10-29 ENCOUNTER — Ambulatory Visit: Payer: Medicare Other | Admitting: Occupational Therapy

## 2021-10-29 DIAGNOSIS — F5105 Insomnia due to other mental disorder: Secondary | ICD-10-CM

## 2021-11-03 ENCOUNTER — Ambulatory Visit: Payer: Medicare Other | Admitting: Occupational Therapy

## 2021-11-05 ENCOUNTER — Ambulatory Visit: Payer: Medicare Other | Admitting: Occupational Therapy

## 2021-11-05 DIAGNOSIS — I890 Lymphedema, not elsewhere classified: Secondary | ICD-10-CM

## 2021-11-07 NOTE — Therapy (Signed)
OUTPATIENT  OCCUPATIONAL THERAPY Treatment Note BILATERAL LOWER EXTREMITY LYMPHEDEMA   Patient Name: Sarah [name] MRN: [id number] DOB:04-17-1950, 71 y.o., female Today's Date: 11/07/2021  OBJECTIVE:  OT End of Session - 11/07/21 0828     Visit Number 6    Number of Visits 36    Date for OT Re-Evaluation 12/09/21    OT Start Time 0200    OT Stop Time 0315    OT Time Calculation (min) 75 min    Activity Tolerance Patient tolerated treatment well;No increased pain   Pt with L back pain throughout session limited sitting tolerance. Provided moist heat pack throughout session for pain relief.   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)    PMS (premenstrual syndrome)    Past Surgical History:  Procedure Laterality Date   BREAST BIOPSY Right    benign   CHOLECYSTECTOMY     COLONOSCOPY  11/08/2018   Danis-polyps   COLONOSCOPY  12/05/2019   Danis   DILATION AND CURETTAGE OF UTERUS     FOOT SURGERY Right 2018   removal of melanoma   LUMBAR FUSION     lymph node removal Right 2018   groin    POLYPECTOMY     TOTAL ABDOMINAL HYSTERECTOMY W/ BILATERAL SALPINGOOPHORECTOMY     TUBAL LIGATION     Patient Active Problem List   Diagnosis Date Noted   Urinary symptom or sign 10/21/2021   Osteopenia of neck of right femur 10/02/2021   Cervical radiculitis 07/11/2021   Vitamin D deficiency 02/05/2021   Adrenal  insufficiency (Addison's disease) ([place]) 02/04/2021   Chronic lower back pain 02/02/2021   Insomnia due to other mental disorder 02/02/2021   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   B12 deficiency 02/01/2018   History of antineoplastic chemotherapy 10/17/2017   Prolonged Q-T interval on ECG 09/04/2017   History of malignant melanoma 01/07/2017   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[place]) 12/26/2011   Hypertension associated with diabetes ([place]) 09/17/2010   Hyperlipidemia associated with type 2 diabetes mellitus ([place]) 09/17/2010   Type 2 diabetes mellitus with obesity ([place]) 09/17/2010    PCP: Sarah Guarneri, NP  REFERRING PROVIDER: April Salama, MD  REFERRING DIAG: I89.0  THERAPY DIAG:  Lymphedema, not elsewhere classified  Rationale for Evaluation and Treatment Rehabilitation  ONSET DATE: 09/10/21  SUBJECTIVE  SUBJECTIVE STATEMENT: Sarah [name]  presents to Occupational Therapy for treatment visit. Pt reports LE-related leg pain 6/10.  Pt arrives with RLE in compression wraps  in place. Pt scheduled MD appointment re ongoing back pain. Moist heat pack to area throughout session for pain relief and relaxation.  PERTINENT HISTORY:  S/P R plantar melanoma 2018 chemotherapy  PAIN:  Are you having pain? NO: NPRS scale: 6/10 Pain location: RLE Pain description: burning, sore Aggravating factors: standing , walking, dependent sitting Relieving factors: elevation   PRECAUTIONS: Other: lymphedema   PATIENT GOALS reduce leg swelling and associated pain   LYMPHEDEMA ASSESSMENTS:   BLE COMPARATIVE LIMB VOLUMETRICS: T LANDMARK RIGHT  09/30/21  R LEG (A-D)  Initial 4262.7 ml  R THIGH (E-G)   R FULL LIMB (A-G)   Limb Volume differential (LVD)    Volume change since initial    Volume change overall V  (Blank rows = not tested)  LANDMARK LEFT 09/30/21  L LEG (A-D) Initial 3498.0  RLTHIGH (E-G)   L FULL LIMB (A-G)   Limb Volume differential (LVD)   18%, R (dom, Rx) > L  Volume change since initial %  Volume change overall %  (Blank rows = not tested)    LYMPHEDEMA LIFE IMPACT SCALE (LLIS): 10/13/21 72.06% (extent to which lymphedema-associated problems affected your daily life over the past week)  FOTO functional outcome score: Intake 09/10/21 60/100%  TODAY'S TREATMENT  MLD to RLE utilizing short neck sequence, diaphragmatic breathing, functional inguinal LN and proximal to distal J strokes from thigh to foot. Fibrosis techniques to dorsal foot.  Skin care throughout MLD using low ph castor oil to increase skin excursion and improve hydration to limit infection risk Multilayer RLE compression wraps using short stretch bandages over Rosidal foam from base of toes to popliteal fossa. Gradient pattern.  PATIENT EDUCATION:  Education details: Pt edu focused on differences between custom  flat knit and off-the-shelf circular elastic compression garments.  Educated Pt re indications for both and pros and cons of each, and rational for therapist's current recommendations  Educated Pt re estimated costs, measuring and fitting process ,DME vendor's involvement and access to insurance benefits.  CDT. All questions answered to the Pt's satisfaction. Good return. Person educated: Patient Education method: Explanation, Demonstration, Tactile cues, Verbal cues, and Handouts Education comprehension: verbalized understanding, returned demonstration, verbal cues required, tactile cues required, and needs further education   Lymphedema Self-Care HOME PROGRAM: Simple self-MLD Lymphatic pumping there ex Skin care Compression   ASSESSMENT:  CLINICAL IMPRESSION: Held off on measuring for garments this date due to recent missed sessions. Cont ede re garment recommendations  and clinical reasoning for comprtession options selected.  Pt tolerated MLD to RLE/RLQ without increased pain. Decreased tissue density noted in R leg and foot after manual therapy.Due to quick Rx responsewe'll complete RLE compression garment measurements next session.Cont as per POC.  OBJECTIVE IMPAIRMENTS Abnormal gait, decreased activity tolerance, decreased balance, decreased knowledge of condition, decreased knowledge of use of DME, decreased mobility, decreased ROM, increased edema, impaired flexibility, impaired sensation, and pain, chronic , progressive leg swelling  ACTIVITY LIMITATIONS  ADLs, productive activities, leisure pursuits, social participation   PARTICIPATION LIMITATIONS: cleaning, driving, shopping, community activity, and occupation  PERSONAL FACTORS 1 comorbidity: HTN  are also affecting patient's functional outcome.   REHAB POTENTIAL: Good  CLINICAL DECISION MAKING: Stable/uncomplicated  EVALUATION COMPLEXITY: Low   GOALS: Goals reviewed with patient? Yes  LONG TERM GOALS: Target date: 01/30/2022  (Remove Blue Hyperlink)  Given this patient's Intake score of 72.06 % on the Lymphedema Life Impact Scale (LLIS), patient will experience an increase of 10 % reduction in her perceived level of functional impairment resulting from  lymphedema to improve functional performance and quality of life  Baseline:Max A Goal status: INITIAL  2.   Pt will demonstrate understanding of lymphedema precautions and prevention strategies with modified independence using a printed reference to identify at least 5 precautions and discussing how s/he may implement them into daily life to reduce risk of progression and to limit infection risk. Baseline: Max A Goal status: INITIAL  3.  Given this patient's Intake score of 60/100% on the functional outcomes FOTO tool, patient will experience an increase in function of 5 points to improve basic and instrumental ADLs performance, including  lymphedema self-care. Baseline: 60/100% Goal status: INITIAL  4.  Pt presents with limited knowledge of lymphedema etiology, treatment protocol and precautions. After skilled education Pt will verbalize understanding of lymphedema diagnosis, treatment and essential self-care routines to ensure optimal, long term self-management and to limit progression. Baseline: Max A Goal status: INITIAL  5.   Pt will demonstrate understanding of lymphedema precautions and prevention strategies with modified independence using a printed reference to identify at least 5 precautions and discussing how s/he may implement them into daily life to reduce risk of progression and to limit infection risk. Baseline: Max A Goal status: INITIAL  6.  With modified independence (extra time)  Pt will be able to apply multilayer, thigh length, compression wraps using gradient techniques from base of toes to popliteal fossa  to decrease limb volume, to limit infection risk, and to limit lymphedema progression.  Baseline: Dependent Goal status: 10/13/21 GOAL MET   7.  Pt will achieve at least 10% limb volume reduction below the R knee  to return limb to more normal size and shape,  to increase body symmetry for increased balance, to limit infection risk, to decrease pain, and to limit lymphedema progression. Baseline: Dependent Goal status: INITIAL  8.   Pt will achieve and sustain no less than 85% compliance with all LE self-care home program components throughout CDT, including modified simple self-MLD, daily skin care and inspection, lymphatic pumping the ex and appropriate compression to limit lymphedema progression and to limit further functional decline. Baseline: Dependent Goal status: INITIAL   PLAN: PT FREQUENCY: 2x/week  PT DURATION: x 12 weeks, and other: PRN  for support during slef-management phase CDT  PLANNED INTERVENTIONS:  Complete Decongestive Therapy (CDT): Including MLD, Therapeutic lymphatic  pumping exercises, Manual lymph drainage (MLD), Compression bandaging, then appropriate compression garment / device measurement and fitting, and skin care to limit infection. Therapeutic activity,  Patient/Family education,  DME instructions, Kinesiotaping  PLAN FOR NEXT SESSION: BRLE knee length multilayer compression bandaging- and Pt edu and opportunities to practice.  Andrey Spearman, MS, OTR/L, CLT-LANA 11/07/21 8:29 AM

## 2021-11-09 NOTE — Patient Instructions (Signed)

## 2021-11-10 ENCOUNTER — Encounter: Payer: Self-pay | Admitting: Nurse Practitioner

## 2021-11-10 ENCOUNTER — Ambulatory Visit (INDEPENDENT_AMBULATORY_CARE_PROVIDER_SITE_OTHER): Payer: Medicare Other | Admitting: Nurse Practitioner

## 2021-11-10 VITALS — BP 133/76 | HR 73 | Temp 97.9°F | Ht 64.49 in | Wt 222.9 lb

## 2021-11-10 DIAGNOSIS — E1169 Type 2 diabetes mellitus with other specified complication: Secondary | ICD-10-CM | POA: Diagnosis not present

## 2021-11-10 DIAGNOSIS — E1159 Type 2 diabetes mellitus with other circulatory complications: Secondary | ICD-10-CM | POA: Diagnosis not present

## 2021-11-10 DIAGNOSIS — F5105 Insomnia due to other mental disorder: Secondary | ICD-10-CM

## 2021-11-10 DIAGNOSIS — F3341 Major depressive disorder, recurrent, in partial remission: Secondary | ICD-10-CM

## 2021-11-10 DIAGNOSIS — Z23 Encounter for immunization: Secondary | ICD-10-CM

## 2021-11-10 DIAGNOSIS — E538 Deficiency of other specified B group vitamins: Secondary | ICD-10-CM

## 2021-11-10 DIAGNOSIS — Z85820 Personal history of malignant melanoma of skin: Secondary | ICD-10-CM

## 2021-11-10 DIAGNOSIS — G6289 Other specified polyneuropathies: Secondary | ICD-10-CM

## 2021-11-10 DIAGNOSIS — E559 Vitamin D deficiency, unspecified: Secondary | ICD-10-CM

## 2021-11-10 DIAGNOSIS — M5441 Lumbago with sciatica, right side: Secondary | ICD-10-CM

## 2021-11-10 DIAGNOSIS — E271 Primary adrenocortical insufficiency: Secondary | ICD-10-CM

## 2021-11-10 DIAGNOSIS — E669 Obesity, unspecified: Secondary | ICD-10-CM

## 2021-11-10 DIAGNOSIS — E785 Hyperlipidemia, unspecified: Secondary | ICD-10-CM

## 2021-11-10 DIAGNOSIS — M85851 Other specified disorders of bone density and structure, right thigh: Secondary | ICD-10-CM

## 2021-11-10 DIAGNOSIS — F99 Mental disorder, not otherwise specified: Secondary | ICD-10-CM

## 2021-11-10 DIAGNOSIS — I152 Hypertension secondary to endocrine disorders: Secondary | ICD-10-CM

## 2021-11-10 DIAGNOSIS — G8929 Other chronic pain: Secondary | ICD-10-CM

## 2021-11-10 DIAGNOSIS — M5442 Lumbago with sciatica, left side: Secondary | ICD-10-CM

## 2021-11-10 LAB — BAYER DCA HB A1C WAIVED: HB A1C (BAYER DCA - WAIVED): 7.4 % — ABNORMAL HIGH (ref 4.8–5.6)

## 2021-11-10 MED ORDER — HYDROCODONE-ACETAMINOPHEN 5-325 MG PO TABS: 1.0000 | ORAL_TABLET | Freq: Four times a day (QID) | ORAL | 0 refills | Status: AC | PRN

## 2021-11-10 MED ORDER — BACLOFEN 5 MG PO TABS: 5.0000 mg | ORAL_TABLET | Freq: Three times a day (TID) | ORAL | 0 refills | Status: DC | PRN

## 2021-11-10 NOTE — Assessment & Plan Note (Signed)
Followed by neurology, recent notes reviewed.  Continue current medication regimen as prescribed by them.

## 2021-11-10 NOTE — Assessment & Plan Note (Signed)
Ongoing with osteopenia. Noted past labs, recommend taking Vitamin D3 2000 units daily and recheck level today.

## 2021-11-10 NOTE — Assessment & Plan Note (Signed)
Chronic, ongoing.  Continue Crestor and adjust as needed.  CMP and Lipid check today.

## 2021-11-10 NOTE — Assessment & Plan Note (Signed)
Chronic, stable.  Denies SI/HI.  Followed by psychiatry -- recent notes reviewed and will maintain current medication regimen as prescribed by them. 

## 2021-11-10 NOTE — Assessment & Plan Note (Addendum)
Chronic, ongoing.  Followed by psychiatry who fills Belsomra, continue current medication regimen as prescribed by them. 

## 2021-11-10 NOTE — Assessment & Plan Note (Signed)
Chronic, with current acute flare.  Will send in short burst of Norco, aware not to take at same time as Belsomra, and send in short period Baclofen.  Already on steroid medication and can not take Tizanidine due to QT prolongation use.  Recommend continue Tylenol at home, but reduce while taking Norco.  Use Voltaren gel and TENS machine.  If worsening or ongoing alert provider and will get her into Dr. Sharlet Salina.

## 2021-11-10 NOTE — Assessment & Plan Note (Signed)
Chronic, stable.  A1c last visit 5.8%, urine ALB 10 in January 2023.  At this time recommend focus on diet and regular activity.  Monitor A1c closely and initiate medication as needed.  Will provide information on diabetic diet.  Recheck A!c today and adjust plan as needed.

## 2021-11-10 NOTE — Assessment & Plan Note (Signed)
Noted on DEXA 10/01/21, educated her on this today.  Recommend she take 2000 units Vitamin D daily and ensure good calcium intake.  Repeat DEXA in 2028.

## 2021-11-10 NOTE — Progress Notes (Signed)
BP 133/76   Pulse 73   Temp 97.9 F (36.6 C) (Oral)   Ht 5' 4.49" (1.638 m)   Wt 222 lb 14.4 oz (101.1 kg)   SpO2 99%   BMI 37.68 kg/m    Subjective:    Patient ID: Sarah [name], female    DOB: 10/12/1950, 71 y.o.   MRN: [id number]  HPI: Sarah [name] is a 71 y.o. female  Chief Complaint  Patient presents with   Hypertension   Diabetes   Back Pain    For past 3 weeks, bent down to put lease on dog and now back is painful   DIABETES Last A1c April 5.8%.  Not currently on medication.  Has not been able to go to gym in past few weeks.   [facility] every 6 months for history malignant melanoma in 2018 -- last visit on 04/09/21 and oncology 08/25/21 (does not need to return).  Followed by neurology for neuropathy -- last seen 01/05/21, continues Pregabalin. 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   Hypoglycemic episodes: unknown Polydipsia/polyuria: no Visual disturbance: no Chest pain: no Paresthesias:  has neuropathy -- followed  by neurology at [facility], takes Lyrica Glucose Monitoring: no             Accucheck frequency: Not Checking             Fasting glucose:             Post prandial:             Evening:             Before meals: Taking Insulin?: no             Long acting insulin:             Short acting insulin: Blood Pressure Monitoring: daily Retinal Examination: Up to Date --  [facility] Foot Exam: Up to Date Pneumovax:  Up To Date Influenza: Up To Date Aspirin: no    HYPERTENSION / HYPERLIPIDEMIA Continues on Losartan, Amlodipine, and Rosuvastatin + Lasix. Satisfied with current treatment? yes Duration of hypertension: chronic BP monitoring frequency: daily BP range: forgot to bring paper, averaging <130/80 BP medication side effects: no Past BP meds: none Duration of hyperlipidemia:  chronic Cholesterol medication side effects: no Cholesterol supplements: none Past cholesterol medications:  Medication compliance: good compliance Aspirin: no Recent stressors: no Recurrent headaches: no Visual changes: no Palpitations: no Dyspnea: no Chest pain: no Lower extremity edema: yes -- to right foot where history of melanoma -- doing wraps and compression socks Dizzy/lightheaded: no   OSTEOPENIA Last DEXA on 10/02/21 noting T-score -1.1. Satisfied with current treatment?: yes Adequate calcium & vitamin D: yes Weight bearing exercises: yes   BACK PAIN Current acute episode.  Has seen Dr. Sharlet Salina in past for injections to cervical spine, last 08/01/21.   Three weeks ago bent down to put leash on dog and then mid-back pain started that radiates down lower back and left leg.   Duration: weeks Mechanism of injury: lifting Location: Left, midline, and low back Onset: gradual Severity: 5/10 Quality: dull, aching, tender, tearing, and throbbing Frequency: constant Radiation: L leg above the knee Aggravating factors: lifting, movement, walking, and bending Alleviating factors: nothing Status: stable Treatments attempted: APAP , heat Relief with NSAIDs?: No NSAIDs Taken Nighttime pain:  no Paresthesias / decreased  sensation:  no Bowel / bladder incontinence:  no Fevers:  no Dysuria / urinary frequency:  no    DEPRESSION Sees [facility] every 5 months, last 10/13/21 -- Lesle Chris NP.  Continues on Lexapro, Belsomra, and Amitriptyline.  Lives at [facility].   History of suicide attempts, in 2012 last after her husband passed away.   Mood status: stable Satisfied with current treatment?: yes Symptom severity: moderate  Duration of current treatment : chronic Side effects: no Medication compliance: good compliance Psychotherapy/counseling: yes in the past Previous psychiatric medications:  Depressed mood: a little bit, was not feeling well Anxious mood:  no Anhedonia: no Significant weight loss or gain: no Insomnia: yes hard to fall asleep -- stable with medication Fatigue: no Feelings of worthlessness or guilt: no Impaired concentration/indecisiveness: no Suicidal ideations: no Hopelessness: no Crying spells: no    11/10/2021    1:14 PM 07/15/2021   12:13 PM 07/11/2021    1:12 PM 05/30/2021    1:36 PM 04/25/2021    1:34 PM  Depression screen PHQ 2/9  Decreased Interest 1 0 1 1 0  Down, Depressed, Hopeless 1 0 1 0 0  PHQ - 2 Score 2 0 2 1 0  Altered sleeping '1  1 1 '$ 0  Tired, decreased energy '1  1 1 1  '$ Change in appetite 1  0 2 1  Feeling bad or failure about yourself  0  0 0 0  Trouble concentrating 0  0 1 0  Moving slowly or fidgety/restless 0  0 0 0  Suicidal thoughts 0  0 0 0  PHQ-9 Score '5  4 6 2  '$ Difficult doing work/chores Not difficult at all  Not difficult at all Not difficult at all Not difficult at all       11/10/2021    1:14 PM 07/11/2021    1:12 PM 05/30/2021    1:36 PM 02/04/2021   11:20 AM  GAD 7 : Generalized Anxiety Score  Nervous, Anxious, on Edge 1 0 1 0  Control/stop worrying 0 0 0 0  Worry too much - different things 0 0 0 1  Trouble relaxing 0 0 1 1  Restless 0 0 0 0  Easily annoyed or irritable 0 0 0 0  Afraid - awful might happen 0 0 0 0  Total GAD 7 Score 1 0 2 2  Anxiety Difficulty Not difficult at all Not difficult at all Not difficult at all Not difficult at all    Relevant past medical, surgical, family and social history reviewed and updated as indicated. Interim medical history since our last visit reviewed. Allergies and medications reviewed and updated.  Review of Systems  Per HPI unless specifically indicated above     Objective:    BP 133/76   Pulse 73   Temp 97.9 F (36.6 C) (Oral)   Ht 5' 4.49" (1.638 m)   Wt 222 lb 14.4 oz (101.1 kg)   SpO2 99%   BMI 37.68 kg/m   Wt Readings from Last 3 Encounters:  11/10/21 222 lb 14.4 oz (101.1 kg)  07/11/21 224 lb (101.6 kg)   04/25/21 220 lb 3.2 oz (99.9 kg)    Physical Exam  Results for orders placed or performed in visit on 10/21/21  Urine Culture   Specimen: Urine   UR  Result Value Ref Range   Urine Culture, Routine Final report    Organism ID, Bacteria Comment   Microscopic Examination   Urine  Result  Value Ref Range   WBC, UA 0-5 0 - 5 /hpf   RBC, Urine 0-2 0 - 2 /hpf   Epithelial Cells (non renal) None seen 0 - 10 /hpf   Bacteria, UA None seen None seen/Few  Urinalysis, Routine w reflex microscopic  Result Value Ref Range   Specific Gravity, UA 1.015 1.005 - 1.030   pH, UA 5.5 5.0 - 7.5   Color, UA Yellow Yellow   Appearance Ur Clear Clear   Leukocytes,UA 1+ (A) Negative   Protein,UA Negative Negative/Trace   Glucose, UA Negative Negative   Ketones, UA Negative Negative   RBC, UA Negative Negative   Bilirubin, UA Negative Negative   Urobilinogen, Ur 0.2 0.2 - 1.0 mg/dL   Nitrite, UA Negative Negative   Microscopic Examination See below:       Assessment & Plan:   Problem List Items Addressed This Visit       Cardiovascular and Mediastinum   Hypertension associated with diabetes ([place])   Relevant Orders   Bayer DCA Hb A1c Waived   Comprehensive metabolic panel     Endocrine   Adrenal insufficiency (Addison's disease) (HCC)    Chronic, ongoing.  Followed by endocrinology, continue current regimen as ordered by them.  Recent notes reviewed.      Relevant Orders   Comprehensive metabolic panel   Hyperlipidemia associated with type 2 diabetes mellitus ([facility])    Chronic, ongoing.  Continue Crestor and adjust as needed.  CMP and Lipid check today.      Relevant Orders   Bayer DCA Hb A1c Waived   Comprehensive metabolic panel   Lipid Panel w/o Chol/HDL Ratio   Type 2 diabetes mellitus with obesity (HCC) - Primary    Chronic, stable.  A1c last visit 5.8%, urine ALB 10 in January 2023.  At this time recommend focus on diet and regular activity.  Monitor A1c closely and initiate  medication as needed.  Will provide information on diabetic diet.  Recheck A!c today and adjust plan as needed.      Relevant Orders   Bayer DCA Hb A1c Waived     Nervous and Auditory   Axonal sensorimotor neuropathy    Followed by neurology, recent notes reviewed.  Continue current medication regimen as prescribed by them.      Relevant Medications   Baclofen 5 MG TABS     Musculoskeletal and Integument   Osteopenia of neck of right femur    Noted on DEXA 10/01/21, educated her on this today.  Recommend she take 2000 units Vitamin D daily and ensure good calcium intake.  Repeat DEXA in 2028.      Relevant Orders   VITAMIN D 25 Hydroxy (Vit-D Deficiency, Fractures)     Other   Chronic lower back pain    Chronic, with current acute flare.  Will send in short burst of Norco, aware not to take at same time as Belsomra, and send in short period Baclofen.  Already on steroid medication and can not take Tizanidine due to QT prolongation use.  Recommend continue Tylenol at home, but reduce while taking Norco.  Use Voltaren gel and TENS machine.  If worsening or ongoing alert provider and will get her into Dr. Sharlet Salina.      Relevant Medications   Baclofen 5 MG TABS   HYDROcodone-acetaminophen (NORCO/VICODIN) 5-325 MG tablet   History of malignant melanoma    Followed by dermatology - continue this collaboration, as well as oncology collaboration.  Insomnia due to other mental disorder    Chronic, ongoing.  Followed by psychiatry who fills Belsomra, continue current medication regimen as prescribed by them.      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HCC)    Chronic, stable.  Denies SI/HI.  Followed by psychiatry -- recent notes reviewed and will maintain current medication regimen as prescribed by them.      Vitamin D deficiency    Ongoing with osteopenia. Noted past labs, recommend taking Vitamin D3 2000 units daily and recheck level today.      Relevant Orders    VITAMIN D 25 Hydroxy (Vit-D Deficiency, Fractures)   Other Visit Diagnoses     Flu vaccine need       Flu vaccine in office today.   Relevant Orders   Flu Vaccine QUAD High Dose(Fluad) (Completed)        Follow up plan: Return in about 3 months (around 02/10/2022) for T2DM, HTN/HLD, MOOD.

## 2021-11-10 NOTE — Assessment & Plan Note (Signed)
Chronic, ongoing.  Followed by endocrinology, continue current regimen as ordered by them.  Recent notes reviewed.

## 2021-11-10 NOTE — Assessment & Plan Note (Signed)
Followed by dermatology - continue this collaboration, as well as oncology collaboration. 

## 2021-11-11 NOTE — Progress Notes (Signed)
Contacted via MyChart   Good evening Konni, only Vitamin D has returned so far -- hoping remainder returns tomorrow.  Vitamin D is normal range, continue supplement.:)

## 2021-11-12 ENCOUNTER — Emergency Department
Admission: EM | Admit: 2021-11-12 | Discharge: 2021-11-12 | Disposition: A | Discharge status: Home / Self Care | Payer: Medicare Other | Attending: Emergency Medicine | Admitting: Emergency Medicine

## 2021-11-12 DIAGNOSIS — I890 Lymphedema, not elsewhere classified: Secondary | ICD-10-CM | POA: Diagnosis not present

## 2021-11-12 DIAGNOSIS — E119 Type 2 diabetes mellitus without complications: Secondary | ICD-10-CM | POA: Diagnosis not present

## 2021-11-12 DIAGNOSIS — R42 Dizziness and giddiness: Secondary | ICD-10-CM

## 2021-11-12 DIAGNOSIS — E860 Dehydration: Secondary | ICD-10-CM | POA: Diagnosis not present

## 2021-11-12 DIAGNOSIS — R531 Weakness: Secondary | ICD-10-CM | POA: Diagnosis present

## 2021-11-12 DIAGNOSIS — I10 Essential (primary) hypertension: Secondary | ICD-10-CM | POA: Insufficient documentation

## 2021-11-12 DIAGNOSIS — R55 Syncope and collapse: Secondary | ICD-10-CM | POA: Insufficient documentation

## 2021-11-12 LAB — CBC
HCT: 33.3 % — ABNORMAL LOW (ref 36.0–46.0)
Hemoglobin: 11.2 g/dL — ABNORMAL LOW (ref 12.0–15.0)
MCH: 28.8 pg (ref 26.0–34.0)
MCHC: 33.6 g/dL (ref 30.0–36.0)
MCV: 85.6 fL (ref 80.0–100.0)
Platelets: 162 10*3/uL (ref 150–400)
RBC: 3.89 MIL/uL (ref 3.87–5.11)
RDW: 12.5 % (ref 11.5–15.5)
WBC: 7.8 10*3/uL (ref 4.0–10.5)
nRBC: 0 % (ref 0.0–0.2)

## 2021-11-12 LAB — BASIC METABOLIC PANEL
Anion gap: 7 (ref 5–15)
BUN: 14 mg/dL (ref 8–23)
CO2: 25 mmol/L (ref 22–32)
Calcium: 8.2 mg/dL — ABNORMAL LOW (ref 8.9–10.3)
Chloride: 108 mmol/L (ref 98–111)
Creatinine, Ser: 1.02 mg/dL — ABNORMAL HIGH (ref 0.44–1.00)
GFR, Estimated: 59 mL/min — ABNORMAL LOW (ref >60)
Glucose, Bld: 163 mg/dL — ABNORMAL HIGH (ref 70–99)
Potassium: 3.5 mmol/L (ref 3.5–5.1)
Sodium: 140 mmol/L (ref 135–145)

## 2021-11-12 LAB — LIPID PANEL W/O CHOL/HDL RATIO
Cholesterol, Total: 133 mg/dL (ref 100–199)
HDL: 45 mg/dL (ref >39)
LDL Chol Calc (NIH): 56 mg/dL (ref 0–99)
Triglycerides: 197 mg/dL — ABNORMAL HIGH (ref 0–149)
VLDL Cholesterol Cal: 32 mg/dL (ref 5–40)

## 2021-11-12 LAB — COMPREHENSIVE METABOLIC PANEL
ALT: 14 IU/L (ref 0–32)
AST: 14 IU/L (ref 0–40)
Albumin/Globulin Ratio: 2.3 — ABNORMAL HIGH (ref 1.2–2.2)
Albumin: 4.1 g/dL (ref 3.9–4.9)
Alkaline Phosphatase: 74 IU/L (ref 44–121)
BUN/Creatinine Ratio: 12 (ref 12–28)
BUN: 13 mg/dL (ref 8–27)
Bilirubin Total: 0.2 mg/dL (ref 0.0–1.2)
CO2: 20 mmol/L (ref 20–29)
Calcium: 8.8 mg/dL (ref 8.7–10.3)
Chloride: 101 mmol/L (ref 96–106)
Creatinine, Ser: 1.06 mg/dL — ABNORMAL HIGH (ref 0.57–1.00)
Globulin, Total: 1.8 g/dL (ref 1.5–4.5)
Glucose: 193 mg/dL — ABNORMAL HIGH (ref 70–99)
Potassium: 4.1 mmol/L (ref 3.5–5.2)
Sodium: 139 mmol/L (ref 134–144)
Total Protein: 5.9 g/dL — ABNORMAL LOW (ref 6.0–8.5)
eGFR: 57 mL/min/{1.73_m2} — ABNORMAL LOW (ref >59)

## 2021-11-12 LAB — VITAMIN D 25 HYDROXY (VIT D DEFICIENCY, FRACTURES): Vit D, 25-Hydroxy: 34.1 ng/mL (ref 30.0–100.0)

## 2021-11-12 MED ORDER — SODIUM CHLORIDE 0.9 % IV BOLUS: 1000.0000 mL | Freq: Once | INTRAVENOUS | Status: DC

## 2021-11-12 NOTE — ED Triage Notes (Signed)
First nurse Note:  [place] via [place].  Fall due to weakness.  Per report, + orthostatic VS.  Received 450 ml IVF.  CBG:  220.  AAOx3.  Skin warm and dry. NAD

## 2021-11-12 NOTE — ED Provider Notes (Signed)
[facility] Provider Note   Event Date/Time   First MD Initiated Contact with Patient 11/12/21 1817     (approximate) History  Weakness  HPI Sarah [name] is a 71 y.o. female with a stated past medical history of hypertension and type 2 diabetes who arrives from her long-term care facility via EMS after a an episode of presyncope.  Patient states that she got up to start walking and felt sudden onset of weakness and had to drop to her knees.  Patient states that she had slightly blurred vision but never lost consciousness.  Patient states that this episode lasted less than 1 minute and she returned to her baseline soon after sitting for a second on the floor.  Patient does state that she had an upper respiratory illness as well as vomiting and diarrhea approximately 1 week prior to the symptoms.  Patient currently denies any complaints at this time ROS: Patient currently denies any vision changes, tinnitus, difficulty speaking, facial droop, sore throat, chest pain, shortness of breath, abdominal pain, nausea/vomiting/diarrhea, dysuria, or weakness/numbness/paresthesias in any extremity   Physical Exam  Triage Vital Signs: ED Triage Vitals  Enc Vitals Group     BP 11/12/21 1344 (!) 103/57     Pulse Rate 11/12/21 1344 72     Resp 11/12/21 1344 16     Temp 11/12/21 1344 98.5 F (36.9 C)     Temp src --      SpO2 11/12/21 1344 97 %     Weight 11/12/21 1344 220 lb (99.8 kg)     Height 11/12/21 1344 '5\' 5"'$  (1.651 m)     Head Circumference --      Peak Flow --      Pain Score 11/12/21 1348 0     Pain Loc --      Pain Edu? --      Excl. in [place]? --    Most recent vital signs: Vitals:   11/12/21 1704 11/12/21 1900  BP: 134/79 130/74  Pulse: 74 66  Resp: 16 18  Temp:  98.3 F (36.8 C)  SpO2: 97% 100%   General: Awake, oriented x4. CV:  Good peripheral perfusion.  Resp:  Normal effort.  Abd:  No distention.  Other:  Elderly overweight Caucasian female laying  in bed in no acute distress.  No orthostatic lightheadedness ED Results / Procedures / Treatments  Labs (all labs ordered are listed, but only abnormal results are displayed) Labs Reviewed  BASIC METABOLIC PANEL - Abnormal; Notable for the following components:      Result Value   Glucose, Bld 163 (*)    Creatinine, Ser 1.02 (*)    Calcium 8.2 (*)    GFR, Estimated 59 (*)    All other components within normal limits  CBC - Abnormal; Notable for the following components:   Hemoglobin 11.2 (*)    HCT 33.3 (*)    All other components within normal limits  URINALYSIS, ROUTINE W REFLEX MICROSCOPIC  CBG MONITORING, ED   PROCEDURES: Critical Care performed: No .1-3 Lead EKG Interpretation  Performed by: Naaman Plummer, MD Authorized by: Naaman Plummer, MD     Interpretation: normal     ECG rate:  65   ECG rate assessment: normal     Rhythm: sinus rhythm     Ectopy: none     Conduction: normal    MEDICATIONS ORDERED IN ED: Medications  sodium chloride 0.9 % bolus 1,000 mL (1,000 mLs Intravenous  Patient Refused/Not Given 11/12/21 1900)   IMPRESSION / MDM / ASSESSMENT AND PLAN / ED COURSE  I reviewed the triage vital signs and the nursing notes.                              Patient's presentation is most consistent with acute presentation with potential threat to life or bodily function. Patient presents with complaints of syncope/presyncope ED Workup:  CBC, BMP, Troponin, BNP, ECG Differential diagnosis includes HF, ICH, seizure, stroke, HOCM, ACS, aortic dissection, malignant arrhythmia, or GI bleed. Findings: No evidence of acute laboratory abnormalities.  Troponin negative x1 EKG: No e/o STEMI. No evidence of Brugadas sign, delta wave, epsilon wave, significantly prolonged QTc, or malignant arrhythmia.  Disposition: Discharge. Patient is at baseline at this time. Return precautions expressed and understood in person. Advised follow up with primary care provider or  clinic physician in next 24 hours.   FINAL CLINICAL IMPRESSION(S) / ED DIAGNOSES   Final diagnoses:  Dehydration  Generalized weakness  Postural dizziness with presyncope   Rx / DC Orders   ED Discharge Orders     None      Note:  This document was prepared using Dragon voice recognition software and may include unintentional dictation errors.   Naaman Plummer, MD 11/12/21 [id number]

## 2021-11-12 NOTE — Progress Notes (Signed)
Contacted via MyChart   Good afternoon Quilla, remainder of labs have returned: - Kidney labs, creatinine and eGFR, continues to show mild kidney disease but no worsening.  We will continue to monitor.  Liver function, AST and ALT, normal. - Cholesterol levels remain stable, continue current medications.  Any questions? Keep being amazing!!  Thank you for allowing me to participate in your care.  I appreciate you. Kindest regards, Siara Gorder

## 2021-11-12 NOTE — ED Triage Notes (Signed)
Pt states she was at work and has a sudden onset of weakness and fell. Pt denies any LOC. Pt reports feeling lightheaded.

## 2021-11-13 ENCOUNTER — Telehealth: Payer: Self-pay

## 2021-11-13 ENCOUNTER — Ambulatory Visit: Payer: Medicare Other | Admitting: Occupational Therapy

## 2021-11-13 NOTE — Telephone Encounter (Signed)
Transition Care Management Unsuccessful Follow-up Telephone Call  Date of discharge and from where:  11/12/21, [facility] ED  Attempts:  1st Attempt  Reason for unsuccessful TCM follow-up call:  Unable to leave message

## 2021-11-14 NOTE — Telephone Encounter (Signed)
Transition Care Management Follow-up Telephone Call Date of discharge and from where: 11/12/21, [facility] How have you been since you were released from the hospital? Fine Any questions or concerns? No  Items Reviewed: Did the pt receive and understand the discharge instructions provided? Yes  Medications obtained and verified? Yes  Other? No  Any new allergies since your discharge? No  Dietary orders reviewed? Yes Do you have support at home? No   Home Care and Equipment/Supplies: Were home health services ordered? no If so, what is the name of the agency? N/A  Has the agency set up a time to come to the patient's home? not applicable Were any new equipment or medical supplies ordered?  No What is the name of the medical supply agency? N/A Were you able to get the supplies/equipment? not applicable Do you have any questions related to the use of the equipment or supplies? No  Functional Questionnaire: (I = Independent and D = Dependent) ADLs: I  Bathing/Dressing- I  Meal Prep- I  Eating- I  Maintaining continence- I  Transferring/Ambulation- I  Managing Meds- I  Follow up appointments reviewed:  PCP Hospital f/u appt confirmed? No . Patient states she feels fine and does not need a f/up at this time.  [facility] f/u appt confirmed? No   Are transportation arrangements needed? No  If their condition worsens, is the pt aware to call PCP or go to the Emergency Dept.? Yes Was the patient provided with contact information for the PCP's office or ED? Yes Was to pt encouraged to call back with questions or concerns? Yes

## 2021-11-19 ENCOUNTER — Telehealth: Payer: Self-pay

## 2021-11-19 NOTE — Telephone Encounter (Signed)
Transition Care Management Unsuccessful Follow-up Telephone Call  Date of discharge and from where:  [facility], 11/18/21  Attempts:  1st Attempt  Reason for unsuccessful TCM follow-up call:  No answer/busy

## 2021-11-20 NOTE — Telephone Encounter (Signed)
Transition Care Management Follow-up Telephone Call Date of discharge and from where: 11/18/21, [facility] How have you been since you were released from the hospital? Fine Any questions or concerns? No  Items Reviewed: Did the pt receive and understand the discharge instructions provided? Yes  Medications obtained and verified? Yes  Other? No  Any new allergies since your discharge? No  Dietary orders reviewed? Yes Do you have support at home? Yes   Home Care and Equipment/Supplies: Were home health services ordered? not applicable If so, what is the name of the agency? N/A  Has the agency set up a time to come to the patient's home? not applicable Were any new equipment or medical supplies ordered?  No What is the name of the medical supply agency? N/A Were you able to get the supplies/equipment? not applicable Do you have any questions related to the use of the equipment or supplies? No  Functional Questionnaire: (I = Independent and D = Dependent) ADLs: I  Bathing/Dressing- I  Meal Prep- I  Eating- I  Maintaining continence- I  Transferring/Ambulation- I  Managing Meds- I  Follow up appointments reviewed:  PCP Hospital f/u appt confirmed? No . Patient states she is fine and does not need an extra f/up at this time.  [facility] f/u appt confirmed? No  Are transportation arrangements needed? No  If their condition worsens, is the pt aware to call PCP or go to the Emergency Dept.? Yes Was the patient provided with contact information for the PCP's office or ED? Yes Was to pt encouraged to call back with questions or concerns? Yes

## 2021-11-24 ENCOUNTER — Ambulatory Visit: Payer: Medicare Other | Admitting: Occupational Therapy

## 2021-12-02 ENCOUNTER — Ambulatory Visit: Payer: Medicare Other | Attending: Internal Medicine | Admitting: Occupational Therapy

## 2021-12-02 ENCOUNTER — Other Ambulatory Visit: Payer: Self-pay | Admitting: Nurse Practitioner

## 2021-12-02 DIAGNOSIS — I890 Lymphedema, not elsewhere classified: Secondary | ICD-10-CM | POA: Diagnosis not present

## 2021-12-02 NOTE — Telephone Encounter (Signed)
Unable to refill per protocol, Rx request is too soon, Last refill on 11/10/21 for 45. Will refuse.  Requested Prescriptions  Pending Prescriptions Disp Refills   Baclofen 5 MG TABS [Pharmacy Med Name: BACLOFEN 5 MG TABLET] 45 tablet 0    Sig: TAKE 5 MG BY MOUTH EVERY 8 (EIGHT) HOURS AS NEEDED.     Analgesics:  Muscle Relaxants - baclofen Failed - 12/02/2021  1:05 PM      Failed - Cr in normal range and within 180 days    Creat  Date Value Ref Range Status  04/23/2020 0.89 0.50 - 0.99 mg/dL Final    Comment:    For patients >64 years of age, the reference limit for Creatinine is approximately 13% higher for people identified as African-American. .    Creatinine, Ser  Date Value Ref Range Status  11/12/2021 1.02 (H) 0.44 - 1.00 mg/dL Final   Creatinine, Urine  Date Value Ref Range Status  01/10/2018 14 (L) 20 - 275 mg/dL Final         Passed - eGFR is 30 or above and within 180 days    GFR, Est African American  Date Value Ref Range Status  04/23/2020 77 > OR = 60 mL/min/1.26m Final   GFR, Est Non African American  Date Value Ref Range Status  04/23/2020 67 > OR = 60 mL/min/1.727mFinal   GFR, Estimated  Date Value Ref Range Status  11/12/2021 59 (L) >60 mL/min Final    Comment:    (NOTE) Calculated using the CKD-EPI Creatinine Equation (2021)    eGFR  Date Value Ref Range Status  11/10/2021 57 (L) >59 mL/min/1.73 Final         Passed - Valid encounter within last 6 months    Recent Outpatient Visits           3 weeks ago Type 2 diabetes mellitus with obesity ([facility], [place]   1 month ago Urinary symptom or sign   [facility], NP   4 months ago Hypertension associated with diabetes ([facility]  [place], NP   6 months ago Hypertension associated with diabetes ([facility]  [place] T, NP   7 months ago Type 2 diabetes mellitus with  obesity ([facility]  CrMettlerJoBarbaraann FasterNP       Future Appointments             In 2 months Cannady, JoBarbaraann FasterNP CrM[facility]

## 2021-12-02 NOTE — Therapy (Signed)
OUTPATIENT  OCCUPATIONAL THERAPY Treatment Note BILATERAL LOWER EXTREMITY LYMPHEDEMA   Patient Name: Sarah [name] MRN: [id number] DOB:December 02, 1950, 71 y.o., female Today's Date: 12/03/2021  OBJECTIVE:  OT End of Session - 12/02/21 1518     Visit Number 7    Number of Visits 36    Date for OT Re-Evaluation 12/09/21    OT Start Time 0300    OT Stop Time 0413    OT Time Calculation (min) 73 min    Activity Tolerance Patient tolerated treatment well;No increased pain   Pt with L back pain throughout session limited sitting tolerance. Provided moist heat pack throughout session for pain relief.   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)    PMS (premenstrual syndrome)    Past Surgical History:  Procedure Laterality Date   BREAST BIOPSY Right    benign   CHOLECYSTECTOMY     COLONOSCOPY  11/08/2018   Danis-polyps   COLONOSCOPY  12/05/2019   Danis   DILATION AND CURETTAGE OF UTERUS     FOOT SURGERY Right 2018   removal of melanoma   LUMBAR FUSION     lymph node removal Right 2018   groin    POLYPECTOMY     TOTAL ABDOMINAL HYSTERECTOMY W/ BILATERAL SALPINGOOPHORECTOMY     TUBAL LIGATION     Patient Active Problem List   Diagnosis Date Noted   Osteopenia of neck of right femur 10/02/2021   Cervical radiculitis 07/11/2021   Vitamin D deficiency 02/05/2021   Adrenal insufficiency (Addison's disease) ([place])  02/04/2021   Chronic lower back pain 02/02/2021   Insomnia due to other mental disorder 02/02/2021   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   B12 deficiency 02/01/2018   History of antineoplastic chemotherapy 10/17/2017   Prolonged Q-T interval on ECG 09/04/2017   History of malignant melanoma 01/07/2017   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 Pt reports LE-related leg pain 2/10.  Pt reporting back pain at 4/10. Pt arrives with RLE in compression wraps  in place. Pt reports Dr prescribed pain medication for back pain. Pt continues to wear compression bandages, which may be contributing to back issues? She was last at OT on 10/20. Discussed plan going forward throughout session. Moist heat pack to area throughout session for pain relief and relaxation. Pt agrees with plan to measure for off-the-shelf compression knee highs today.  PERTINENT HISTORY:  S/P R plantar melanoma 2018 chemotherapy  PAIN:  Are you having lymphedema-related pain? NO: NPRS scale: 2/10 Pain location: RLE Pain description: burning, sore Aggravating factors: standing , walking, dependent sitting Relieving factors: elevation   PRECAUTIONS: Other: lymphedema   PATIENT GOALS reduce leg swelling  and associated pain   LYMPHEDEMA ASSESSMENTS:   BLE COMPARATIVE LIMB VOLUMETRICS: T LANDMARK RIGHT  09/30/21  R LEG (A-D)  Initial 4262.7 ml  R THIGH (E-G)   R FULL LIMB (A-G)   Limb Volume differential (LVD)    Volume change since initial   Volume change overall V  (Blank rows = not tested)  LANDMARK LEFT 09/30/21  L LEG (A-D) Initial 3498.0  RLTHIGH (E-G)   L FULL LIMB (A-G)   Limb Volume differential (LVD)   18%, R (dom, Rx) > L  Volume change since initial %  Volume change overall %  (Blank rows = not tested)    LYMPHEDEMA LIFE IMPACT SCALE (LLIS): 10/13/21 72.06% (extent to which lymphedema-associated problems affected your daily life over the past week)  FOTO functional outcome score: Intake 09/10/21 60/100%  TODAY'S TREATMENT  Completed anatomical measurements for knee length, OTS compression stockings. Provided printed list of 3 online retailers who understand lymphedema garments and provide customer service. Provided instructions for ordering recommended garments. MLD to RLE utilizing short neck sequence, diaphragmatic breathing, functional inguinal LN and proximal to distal J strokes from thigh to foot. Fibrosis techniques to dorsal foot.  Skin care throughout MLD using low ph castor oil to increase skin excursion and improve hydration to limit infection risk Multilayer RLE compression wraps using short stretch bandages over Rosidal foam from base of toes to popliteal fossa. Gradient pattern.  PATIENT EDUCATION:  Education details: Pt edu focused on differences between custom  flat knit and off-the-shelf circular elastic compression garments.  Educated Pt re indications for both and pros and cons of each, and rational for therapist's current recommendations  Educated Pt re estimated costs, measuring and fitting process ,DME vendor's involvement and access to insurance benefits.  CDT. All questions answered to the Pt's satisfaction. Good return. Person educated:  Patient Education method: Explanation, Demonstration, Tactile cues, Verbal cues, and Handouts Education comprehension: verbalized understanding, returned demonstration, verbal cues required, tactile cues required, and needs further education   Lymphedema Self-Care HOME PROGRAM: Simple self-MLD Lymphatic pumping there ex Skin care Daytime Compression garments: worn full time daily JUZO "DYNAMIC", knee highs , (A-D), ccl 1 (20-30 mmHg), open toe, silicone top bband, size IV ,SHORT length   ASSESSMENT:  CLINICAL IMPRESSION: Completed OTS compression garment measurements today and provided online ordering resources for patient. Despite increased ankle and leg swelling compared with more frequent attendance and compression wrapping, we decided to move forward with this later part of our plan in case wrapping may be exacerbating back pain. Pt tolerated MLD, skin care and lighter wrapping today to reduce bulk in shoe which may impact  gait.. Fit garments ASAP. Cont as per POC.  OBJECTIVE IMPAIRMENTS Abnormal gait, decreased activity tolerance, decreased balance, decreased knowledge of condition, decreased knowledge of use of DME, decreased mobility, decreased ROM, increased edema, impaired flexibility, impaired sensation, and pain, chronic , progressive leg swelling  ACTIVITY LIMITATIONS  ADLs, productive activities, leisure pursuits, social participation   PARTICIPATION LIMITATIONS: cleaning, driving, shopping, community activity, and occupation  PERSONAL FACTORS 1 comorbidity: HTN  are also affecting patient's functional outcome.   REHAB POTENTIAL: Good  CLINICAL DECISION MAKING: Stable/uncomplicated  EVALUATION COMPLEXITY: Low   GOALS: Goals reviewed with patient? Yes  LONG TERM GOALS: Target date: 02/25/2022  (Remove Blue Hyperlink)  Given this patient's Intake score of 72.06 % on the Lymphedema Life Impact Scale (LLIS), patient will experience an increase of 10 % reduction in her  perceived level of functional impairment resulting from  lymphedema to improve functional performance and quality of life  Baseline:Max A Goal status: INITIAL  2.   Pt will demonstrate understanding of lymphedema precautions and prevention strategies with modified independence using a printed reference to identify at least 5 precautions and discussing how s/he may implement them into daily life to reduce risk of progression and to limit infection risk. Baseline: Max A Goal status: INITIAL  3.  Given this patient's Intake score of 60/100% on the functional outcomes FOTO tool, patient will experience an increase in function of 5 points to improve basic and instrumental ADLs performance, including lymphedema self-care. Baseline: 60/100% Goal status: INITIAL  4.  Pt presents with limited knowledge of lymphedema etiology, treatment protocol and precautions. After skilled education Pt will verbalize understanding of lymphedema diagnosis, treatment and essential self-care routines to ensure optimal, long term self-management and to limit progression. Baseline: Max A Goal status: INITIAL  5.   Pt will demonstrate understanding of lymphedema precautions and prevention strategies with modified independence using a printed reference to identify at least 5 precautions and discussing how s/he may implement them into daily life to reduce risk of progression and to limit infection risk. Baseline: Max A Goal status: INITIAL  6.  With modified independence (extra time)  Pt will be able to apply multilayer, thigh length, compression wraps using gradient techniques from base of toes to popliteal fossa  to decrease limb volume, to limit infection risk, and to limit lymphedema progression.  Baseline: Dependent Goal status: 10/13/21 GOAL MET   7.  Pt will achieve at least 10% limb volume reduction below the R knee  to return limb to more normal size and shape,  to increase body symmetry for increased balance, to  limit infection risk, to decrease pain, and to limit lymphedema progression. Baseline: Dependent Goal status: INITIAL  8.   Pt will achieve and sustain no less than 85% compliance with all LE self-care home program components throughout CDT, including modified simple self-MLD, daily skin care and inspection, lymphatic pumping the ex and appropriate compression to limit lymphedema progression and to limit further functional decline. Baseline: Dependent Goal status: INITIAL   PLAN: PT FREQUENCY: 2x/week  PT DURATION: x 12 weeks, and other: PRN  for support during slef-management phase CDT  PLANNED INTERVENTIONS:  Complete Decongestive Therapy (CDT): Including MLD, Therapeutic lymphatic pumping exercises, Manual lymph drainage (MLD), Compression bandaging, then appropriate compression garment / device measurement and fitting, and skin care to limit infection. Therapeutic activity,  Patient/Family education,  DME instructions, Kinesiotaping  PLAN FOR NEXT SESSION: BRLE knee length multilayer compression bandaging- and Pt edu and opportunities to practice.  Andrey Spearman, MS, OTR/L, CLT-LANA 12/03/21 4:05 PM

## 2021-12-18 ENCOUNTER — Ambulatory Visit: Payer: Medicare Other | Admitting: Occupational Therapy

## 2021-12-25 ENCOUNTER — Ambulatory Visit: Payer: Medicare Other | Admitting: Occupational Therapy

## 2022-01-01 ENCOUNTER — Ambulatory Visit: Payer: Medicare Other | Admitting: Occupational Therapy

## 2022-01-01 ENCOUNTER — Other Ambulatory Visit: Payer: Self-pay | Admitting: Nurse Practitioner

## 2022-01-01 NOTE — Telephone Encounter (Signed)
Requested Prescriptions  Pending Prescriptions Disp Refills   Baclofen 5 MG TABS [Pharmacy Med Name: BACLOFEN 5 MG TABLET] 45 tablet 0    Sig: TAKE 5 MG BY MOUTH EVERY 8 (EIGHT) HOURS AS NEEDED.     Analgesics:  Muscle Relaxants - baclofen Failed - 01/01/2022  3:46 PM      Failed - Cr in normal range and within 180 days    Creat  Date Value Ref Range Status  04/23/2020 0.89 0.50 - 0.99 mg/dL Final    Comment:    For patients >70 years of age, the reference limit for Creatinine is approximately 13% higher for people identified as African-American. .    Creatinine, Ser  Date Value Ref Range Status  11/12/2021 1.02 (H) 0.44 - 1.00 mg/dL Final   Creatinine, Urine  Date Value Ref Range Status  01/10/2018 14 (L) 20 - 275 mg/dL Final         Passed - eGFR is 30 or above and within 180 days    GFR, Est African American  Date Value Ref Range Status  04/23/2020 77 > OR = 60 mL/min/1.72m Final   GFR, Est Non African American  Date Value Ref Range Status  04/23/2020 67 > OR = 60 mL/min/1.776mFinal   GFR, Estimated  Date Value Ref Range Status  11/12/2021 59 (L) >60 mL/min Final    Comment:    (NOTE) Calculated using the CKD-EPI Creatinine Equation (2021)    eGFR  Date Value Ref Range Status  11/10/2021 57 (L) >59 mL/min/1.73 Final         Passed - Valid encounter within last 6 months    Recent Outpatient Visits           1 month ago Type 2 diabetes mellitus with obesity ([facility]  [facility], NP   2 months ago Urinary symptom or sign   [facility]   5 months ago Hypertension associated with diabetes ([facility]  CrH[facility]Jolene T, NP   7 months ago Hypertension associated with diabetes ([facility]  CrBowmanJolene T, NP   8 months ago Type 2 diabetes mellitus with obesity ([facility]CH[facility]  CrKas[place]JoBarbaraann FasterNP       Future Appointments              In 1 month Cannady, JoBarbaraann FasterNP [facility]

## 2022-01-14 ENCOUNTER — Other Ambulatory Visit: Payer: Self-pay | Admitting: Nurse Practitioner

## 2022-01-16 NOTE — Telephone Encounter (Signed)
Requested by interface surescripts. Requested too soon. Signed 01/01/22 #45. Requested Prescriptions  Refused Prescriptions Disp Refills   Baclofen 5 MG TABS [Pharmacy Med Name: BACLOFEN 5 MG TABLET] 45 tablet 0    Sig: TAKE 1 TABLET BY MOUTH EVERY 8 HOURS AS NEEDED     Analgesics:  Muscle Relaxants - baclofen Failed - 01/14/2022  9:31 PM      Failed - Cr in normal range and within 180 days    Creat  Date Value Ref Range Status  04/23/2020 0.89 0.50 - 0.99 mg/dL Final    Comment:    For patients >12 years of age, the reference limit for Creatinine is approximately 13% higher for people identified as African-American. .    Creatinine, Ser  Date Value Ref Range Status  11/12/2021 1.02 (H) 0.44 - 1.00 mg/dL Final   Creatinine, Urine  Date Value Ref Range Status  01/10/2018 14 (L) 20 - 275 mg/dL Final         Passed - eGFR is 30 or above and within 180 days    GFR, Est African American  Date Value Ref Range Status  04/23/2020 77 > OR = 60 mL/min/1.43m Final   GFR, Est Non African American  Date Value Ref Range Status  04/23/2020 67 > OR = 60 mL/min/1.774mFinal   GFR, Estimated  Date Value Ref Range Status  11/12/2021 59 (L) >60 mL/min Final    Comment:    (NOTE) Calculated using the CKD-EPI Creatinine Equation (2021)    eGFR  Date Value Ref Range Status  11/10/2021 57 (L) >59 mL/min/1.73 Final         Passed - Valid encounter within last 6 months    Recent Outpatient Visits           2 months ago Type 2 diabetes mellitus with obesity (HCCandelaria  [place], NP   2 months ago Urinary symptom or sign   [facility]   6 months ago Hypertension associated with diabetes (HCWar  [place]olene T, NP   7 months ago Hypertension associated with diabetes (HCTensas  CrArtesiaJolene T, NP   8 months ago Type 2 diabetes mellitus with obesity (HCHood  CrNanticokeJoBarbaraann FasterNP       Future Appointments             In 3 weeks Cannady, JoBarbaraann FasterNP [facility]

## 2022-02-02 ENCOUNTER — Encounter: Payer: Self-pay | Admitting: Emergency Medicine

## 2022-02-02 ENCOUNTER — Ambulatory Visit: Payer: Self-pay

## 2022-02-02 ENCOUNTER — Ambulatory Visit
Admission: EM | Admit: 2022-02-02 | Discharge: 2022-02-02 | Disposition: A | Discharge status: Home / Self Care | Payer: Medicare Other | Attending: Urgent Care | Admitting: Urgent Care

## 2022-02-02 ENCOUNTER — Other Ambulatory Visit: Payer: Self-pay

## 2022-02-02 DIAGNOSIS — R300 Dysuria: Secondary | ICD-10-CM

## 2022-02-02 NOTE — ED Notes (Signed)
Has appt this Wednesday with pcp

## 2022-02-02 NOTE — ED Provider Notes (Signed)
Roderic Palau    CSN: [id number] Arrival date & time: 02/02/22  1331      History   Chief Complaint No chief complaint on file.   HPI Sarah [name] is a 72 y.o. female.   HPI  Presents to urgent care with complaint of pain in lower abdomen (groin) as well as vaginal irritation.  She endorses painful urination, it takes her a long time to produce urine, bad smell to urine.  She states her symptoms have been present for about 2 weeks and worsening.  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)    PMS (premenstrual syndrome)     Patient Active Problem List   Diagnosis Date Noted   Osteopenia of neck of right femur 10/02/2021   Cervical radiculitis 07/11/2021   Vitamin D deficiency 02/05/2021   Adrenal insufficiency (Addison's disease) ([place]) 02/04/2021   Chronic lower back pain 02/02/2021   Insomnia due to other mental disorder 02/02/2021   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   B12 deficiency 02/01/2018   History of antineoplastic chemotherapy 10/17/2017   Prolonged Q-T interval on ECG 09/04/2017   History of malignant melanoma 01/07/2017   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[place]) 12/26/2011   Hypertension associated with diabetes ([place]) 09/17/2010   Hyperlipidemia associated with type 2 diabetes mellitus ([place]) 09/17/2010   Type 2 diabetes mellitus with obesity ([place]) 09/17/2010    Past Surgical History:  Procedure Laterality Date    BREAST BIOPSY Right    benign   CHOLECYSTECTOMY     COLONOSCOPY  11/08/2018   Danis-polyps   COLONOSCOPY  12/05/2019   Danis   DILATION AND CURETTAGE OF UTERUS     FOOT SURGERY Right 2018   removal of melanoma   LUMBAR FUSION     lymph node removal Right 2018   groin    POLYPECTOMY     TOTAL ABDOMINAL HYSTERECTOMY W/ BILATERAL SALPINGOOPHORECTOMY     TUBAL LIGATION      OB History   No obstetric history on file.      Home Medications    Prior to Admission medications   Medication Sig Start Date End Date Taking? Authorizing Provider  amitriptyline (ELAVIL) 50 MG tablet Take 50 mg by mouth at bedtime. 10/12/19   [provider]  amLODipine (NORVASC) 2.5 MG tablet Take 1 tablet (2.5 mg total) by mouth daily. 05/30/21   Cannady, Henrine Screws T, NP  Baclofen 5 MG TABS TAKE 5 MG BY MOUTH EVERY 8 (EIGHT) HOURS AS NEEDED. 01/01/22   Marnee Guarneri T, NP  Cholecalciferol (VITAMIN D) 50 MCG (2000 UT) CAPS Take by mouth.    [provider]  Cyanocobalamin (VITAMIN B-12 PO) Take by mouth daily.    [provider]  escitalopram (LEXAPRO) 10 MG tablet Take 1 tablet (10 mg total) by mouth daily. 10/13/21  Lesle Chris A, NP  furosemide (LASIX) 40 MG tablet Take 1 tablet (40 mg total) by mouth daily. 02/04/21   Cannady, Henrine Screws T, NP  hydrocortisone (CORTEF) 10 MG tablet 10 mg daily. 15 mg in the am and 5 mg in the pm 09/27/17   [provider]  losartan (COZAAR) 50 MG tablet Take 1 tablet (50 mg total) by mouth daily. 05/30/21   Cannady, Henrine Screws T, NP  meclizine (ANTIVERT) 25 MG tablet Take 1 tablet (25 mg total) by mouth 3 (three) times daily as needed for dizziness. 09/02/21   Cannady, Henrine Screws T, NP  pregabalin (LYRICA) 150 MG capsule 150 mg in am and 300 mg in pm    [provider]  rosuvastatin (CRESTOR) 10 MG tablet Take 1 tablet (10 mg total) by mouth daily. 05/30/21   Cannady, Henrine Screws T, NP  Suvorexant (BELSOMRA) 20 MG TABS TAKE 1 TABLET BY MOUTH EVERYDAY  AT BEDTIME 10/13/21   White, Louanna Raw, NP    Family History Family History  Problem Relation Age of Onset   Depression Mother    Anxiety disorder Mother    Bipolar disorder Mother    Diabetes Mother    Mental illness Mother    Heart disease Mother    Stroke Mother    Lung cancer Father    Diabetes Maternal Grandmother    Heart attack Maternal Grandmother    CVA Paternal Grandmother    Colon cancer Neg Hx    Liver cancer Neg Hx    Pancreatic cancer Neg Hx    Esophageal cancer Neg Hx    Stomach cancer Neg Hx    Rectal cancer Neg Hx    Colon polyps Neg Hx    Breast cancer Neg Hx     Social History Social History   Tobacco Use   Smoking status: Never    Passive exposure: Never   Smokeless tobacco: Never  Vaping Use   Vaping Use: Never used  Substance Use Topics   Alcohol use: Not Currently   Drug use: Never     Allergies   Doxycycline, Ciprofloxacin, and Penicillins   Review of Systems Review of Systems   Physical Exam Triage Vital Signs ED Triage Vitals  Enc Vitals Group     BP 02/02/22 1412 (!) 97/57     Pulse Rate 02/02/22 1412 78     Resp 02/02/22 1412 18     Temp 02/02/22 1412 98.6 F (37 C)     Temp Source 02/02/22 1412 Oral     SpO2 02/02/22 1412 96 %     Weight --      Height --      Head Circumference --      Peak Flow --      Pain Score 02/02/22 1410 10     Pain Loc --      Pain Edu? --      Excl. in [place]? --    No data found.  Updated Vital Signs BP (!) 97/57 (BP Location: Right Arm)   Pulse 78   Temp 98.6 F (37 C) (Oral)   Resp 18   SpO2 96%   Visual Acuity Right Eye Distance:   Left Eye Distance:   Bilateral Distance:    Right Eye Near:   Left Eye Near:    Bilateral Near:     Physical Exam Vitals reviewed.  Constitutional:      Appearance: Normal appearance.  Skin:    General: Skin is warm and  dry.  Neurological:     General: No focal deficit present.     Mental Status: She is alert and oriented to person, place,  and time.  Psychiatric:        Mood and Affect: Mood normal.        Behavior: Behavior normal.      UC Treatments / Results  Labs (all labs ordered are listed, but only abnormal results are displayed) Labs Reviewed - No data to display  EKG   Radiology No results found.  Procedures Procedures (including critical care time)  Medications Ordered in UC Medications - No data to display  Initial Impression / Assessment and Plan / UC Course  I have reviewed the triage vital signs and the nursing notes.  Pertinent labs & imaging results that were available during my care of the patient were reviewed by me and considered in my medical decision making (see chart for details).   Patient is unable to provide urine sample and was given a specimen contain to obtain specimen at home.   Final Clinical Impressions(s) / UC Diagnoses   Final diagnoses:  None   Discharge Instructions   None    ED Prescriptions   None    PDMP not reviewed this encounter.   Rose Phi, [place] 02/02/22 1503

## 2022-02-02 NOTE — ED Triage Notes (Signed)
Pain in lower abdomen, groin area and vaginal area associated with painful urination and odor to urine.  Started after christmas , and worsened over the last week.    Has not taken any medications for symptoms

## 2022-02-02 NOTE — Discharge Instructions (Signed)
Follow up here or with your primary care provider if your symptoms are worsening or not improving.     

## 2022-02-02 NOTE — ED Notes (Signed)
Given second cup of water, unable to provide urinary sample

## 2022-02-02 NOTE — Telephone Encounter (Signed)
  Chief Complaint: Abdominal/groin pain, Foul smelling urine, Burning, not emptying bladder completely. Symptoms: above Frequency: Christmas Pertinent Negatives: Patient denies  Disposition: '[]'$ ED /'[x]'$ Urgent Care (no appt availability in office) / '[]'$ Appointment(In office/virtual)/ '[]'$  [facility]/ '[]'$ Home Care/ '[]'$ Refused Recommended Disposition /'[]'$ [place] [facility]/ '[]'$  Follow-up with PCP Additional Notes: PT has had s/s since christmas. Pt states that Saturday night groin area was very painful when she stood up.     Reason for Disposition  Bad or foul-smelling urine  Answer Assessment - Initial Assessment Questions 1. SYMPTOM: "What's the main symptom you're concerned about?" (e.g., frequency, incontinence)     Burning, pain, foul smelling urine 2. ONSET: "When did the  s/s  start?"     Christmas 3. PAIN: "Is there any pain?" If Yes, ask: "How bad is it?" (Scale: 1-10; mild, moderate, severe)     yes 4. CAUSE: "What do you think is causing the symptoms?"     Moderate 5. OTHER SYMPTOMS: "Do you have any other symptoms?" (e.g., blood in urine, fever, flank pain, pain with urination)     Unknown 6. PREGNANCY: "Is there any chance you are pregnant?" "When was your last menstrual period?"     na  Answer Assessment - Initial Assessment Questions 1. LOCATION: "Where does it hurt?"      Close to groin - Sharp pain  2. RADIATION: "Does the pain shoot anywhere else?" (e.g., chest, back)     Back pain 3. ONSET: "When did the pain begin?" (e.g., minutes, hours or days ago)      After Christmas 4. SUDDEN: "Gradual or sudden onset?"     Gradual 5. PATTERN "Does the pain come and go, or is it constant?"    - If it comes and goes: "How long does it last?" "Do you have pain now?"     (Note: Comes and goes means the pain is intermittent. It goes away completely between bouts.)    - If constant: "Is it getting better, staying the same, or getting worse?"      (Note: Constant means  the pain never goes away completely; most serious pain is constant and gets worse.)      Pain comes and goes 6. SEVERITY: "How bad is the pain?"  (e.g., Scale 1-10; mild, moderate, or severe)    - MILD (1-3): Doesn't interfere with normal activities, abdomen soft and not tender to touch.     - MODERATE (4-7): Interferes with normal activities or awakens from sleep, abdomen tender to touch.     - SEVERE (8-10): Excruciating pain, doubled over, unable to do any normal activities.       Moderate 7. RECURRENT SYMPTOM: "Have you ever had this type of stomach pain before?" If Yes, ask: "When was the last time?" and "What happened that time?"      *No Answer* 8. CAUSE: "What do you think is causing the stomach pain?"     *No Answer* 9. RELIEVING/AGGRAVATING FACTORS: "What makes it better or worse?" (e.g., antacids, bending or twisting motion, bowel movement)     *No Answer* 10. OTHER SYMPTOMS: "Do you have any other symptoms?" (e.g., back pain, diarrhea, fever, urination pain, vomiting)       *No Answer* 11. PREGNANCY: "Is there any chance you are pregnant?" "When was your last menstrual period?"       *No Answer*  Protocols used: Abdominal Pain - Female-A-AH, Urinary Symptoms-A-AH

## 2022-02-03 ENCOUNTER — Other Ambulatory Visit: Payer: Self-pay | Admitting: Behavioral Health

## 2022-02-03 ENCOUNTER — Other Ambulatory Visit: Payer: Self-pay | Admitting: Nurse Practitioner

## 2022-02-03 DIAGNOSIS — F3341 Major depressive disorder, recurrent, in partial remission: Secondary | ICD-10-CM

## 2022-02-03 DIAGNOSIS — F5105 Insomnia due to other mental disorder: Secondary | ICD-10-CM

## 2022-02-03 DIAGNOSIS — F411 Generalized anxiety disorder: Secondary | ICD-10-CM

## 2022-02-04 ENCOUNTER — Telehealth: Payer: Self-pay | Admitting: Behavioral Health

## 2022-02-04 ENCOUNTER — Ambulatory Visit (INDEPENDENT_AMBULATORY_CARE_PROVIDER_SITE_OTHER): Payer: Medicare Other | Admitting: Nurse Practitioner

## 2022-02-04 ENCOUNTER — Encounter: Payer: Self-pay | Admitting: Nurse Practitioner

## 2022-02-04 VITALS — BP 104/56 | HR 72 | Temp 97.8°F | Ht 64.49 in | Wt 221.5 lb

## 2022-02-04 DIAGNOSIS — R1084 Generalized abdominal pain: Secondary | ICD-10-CM

## 2022-02-04 DIAGNOSIS — R8281 Pyuria: Secondary | ICD-10-CM

## 2022-02-04 DIAGNOSIS — R350 Frequency of micturition: Secondary | ICD-10-CM | POA: Diagnosis not present

## 2022-02-04 LAB — WET PREP FOR TRICH, YEAST, CLUE
Clue Cell Exam: NEGATIVE
Trichomonas Exam: NEGATIVE
Yeast Exam: NEGATIVE

## 2022-02-04 LAB — MICROSCOPIC EXAMINATION
Bacteria, UA: NONE SEEN
Epithelial Cells (non renal): NONE SEEN /hpf (ref 0–10)

## 2022-02-04 LAB — URINALYSIS, ROUTINE W REFLEX MICROSCOPIC
Bilirubin, UA: NEGATIVE
Glucose, UA: NEGATIVE
Ketones, UA: NEGATIVE
Nitrite, UA: NEGATIVE
Protein,UA: NEGATIVE
RBC, UA: NEGATIVE
Specific Gravity, UA: 1.01 (ref 1.005–1.030)
Urobilinogen, Ur: 0.2 mg/dL (ref 0.2–1.0)
pH, UA: 5.5 (ref 5.0–7.5)

## 2022-02-04 MED ORDER — NITROFURANTOIN MONOHYD MACRO 100 MG PO CAPS: 100.0000 mg | ORAL_CAPSULE | Freq: Two times a day (BID) | ORAL | 0 refills | Status: DC

## 2022-02-04 NOTE — Patient Instructions (Signed)
Abdominal Pain, Adult Many things can cause belly (abdominal) pain. Most times, belly pain is not dangerous. Many cases of belly pain can be watched and treated at home. Sometimes, though, belly pain is serious. Your doctor will try to find the cause of your belly pain. Follow these instructions at home:  Medicines Take over-the-counter and prescription medicines only as told by your doctor. Do not take medicines that help you poop (laxatives) unless told by your doctor. General instructions Watch your belly pain for any changes. Drink enough fluid to keep your pee (urine) pale yellow. Keep all follow-up visits as told by your doctor. This is important. Contact a doctor if: Your belly pain changes or gets worse. You are not hungry, or you lose weight without trying. You are having trouble pooping (constipated) or have watery poop (diarrhea) for more than 2-3 days. You have pain when you pee or poop. Your belly pain wakes you up at night. Your pain gets worse with meals, after eating, or with certain foods. You are vomiting and cannot keep anything down. You have a fever. You have blood in your pee. Get help right away if: Your pain does not go away as soon as your doctor says it should. You cannot stop vomiting. Your pain is only in areas of your belly, such as the right side or the left lower part of the belly. You have bloody or black poop, or poop that looks like tar. You have very bad pain, cramping, or bloating in your belly. You have signs of not having enough fluid or water in your body (dehydration), such as: Dark pee, very little pee, or no pee. Cracked lips. Dry mouth. Sunken eyes. Sleepiness. Weakness. You have trouble breathing or chest pain. Summary Many cases of belly pain can be watched and treated at home. Watch your belly pain for any changes. Take over-the-counter and prescription medicines only as told by your doctor. Contact a doctor if your belly pain  changes or gets worse. Get help right away if you have very bad pain, cramping, or bloating in your belly. This information is not intended to replace advice given to you by your health care provider. Make sure you discuss any questions you have with your health care provider. Document Revised: 05/16/2018 Document Reviewed: 05/16/2018 Elsevier Patient Education  2023 Elsevier Inc.  

## 2022-02-04 NOTE — Telephone Encounter (Signed)
Pended.

## 2022-02-04 NOTE — Telephone Encounter (Signed)
Unable to refill per protocol, Rx request is too soon. Will refuse.  Requested Prescriptions  Pending Prescriptions Disp Refills   furosemide (LASIX) 40 MG tablet [Pharmacy Med Name: FUROSEMIDE 40 MG TABLET] 90 tablet 4    Sig: TAKE 1 TABLET BY MOUTH EVERY DAY     Cardiovascular:  Diuretics - Loop Failed - 02/03/2022  4:34 PM      Failed - Ca in normal range and within 180 days    Calcium  Date Value Ref Range Status  11/12/2021 8.2 (L) 8.9 - 10.3 mg/dL Final   Calcium, Total  Date Value Ref Range Status  06/04/2011 7.5 (L) 8.5 - 10.1 mg/dL Final   Calcium, Ion  Date Value Ref Range Status  08/25/2010 1.15 1.12 - 1.32 mmol/L Final         Failed - Cr in normal range and within 180 days    Creat  Date Value Ref Range Status  04/23/2020 0.89 0.50 - 0.99 mg/dL Final    Comment:    For patients >80 years of age, the reference limit for Creatinine is approximately 13% higher for people identified as African-American. .    Creatinine, Ser  Date Value Ref Range Status  11/12/2021 1.02 (H) 0.44 - 1.00 mg/dL Final   Creatinine, Urine  Date Value Ref Range Status  01/10/2018 14 (L) 20 - 275 mg/dL Final         Failed - Mg Level in normal range and within 180 days    Magnesium  Date Value Ref Range Status  09/04/2017 1.5 (L) 1.7 - 2.4 mg/dL Final    Comment:    Performed at [facility], [place] [address] [place],  [address]  06/04/2011 1.9 mg/dL Final    Comment:    1.8-2.4 THERAPEUTIC RANGE: 4-7 mg/dL TOXIC: > 10 mg/dL  -----------------------          Passed - K in normal range and within 180 days    Potassium  Date Value Ref Range Status  11/12/2021 3.5 3.5 - 5.1 mmol/L Final  06/04/2011 3.4 (L) 3.5 - 5.1 mmol/L Final         Passed - Na in normal range and within 180 days    Sodium  Date Value Ref Range Status  11/12/2021 140 135 - 145 mmol/L Final  11/10/2021 139 134 - 144 mmol/L Final  06/04/2011 146 (H) 136 - 145 mmol/L Final          Passed - Cl in normal range and within 180 days    Chloride  Date Value Ref Range Status  11/12/2021 108 98 - 111 mmol/L Final  06/04/2011 110 (H) 98 - 107 mmol/L Final         Passed - Last BP in normal range    BP Readings from Last 1 Encounters:  02/02/22 (!) 97/57         Passed - Valid encounter within last 6 months    Recent Outpatient Visits           2 months ago Type 2 diabetes mellitus with obesity ([place])   [place], [place] T, NP   3 months ago Urinary symptom or sign   [place], [place] T, NP   6 months ago Hypertension associated with diabetes ([place])   [place], [name] T, NP   8 months ago Hypertension associated with diabetes ([place])   [place], Barbaraann Faster, NP  9 months ago Type 2 diabetes mellitus with obesity ([place])   [place], Barbaraann Faster, NP       Future Appointments             Today Cannady, Barbaraann Faster, NP [place], [facility]   In 6 days Cannady, [place] T, NP [employer] [facility], [facility]             rosuvastatin (CRESTOR) 10 MG tablet [employer] Med Name: ROSUVASTATIN CALCIUM 10 MG TAB] 90 tablet 3    Sig: TAKE 1 TABLET BY MOUTH EVERY DAY     Cardiovascular:  Antilipid - Statins 2 Failed - 02/03/2022  4:34 PM      Failed - Cr in normal range and within 360 days    Creat  Date Value Ref Range Status  04/23/2020 0.89 0.50 - 0.99 mg/dL Final    Comment:    For patients >28 years of age, the reference limit for Creatinine is approximately 13% higher for people identified as African-American. .    Creatinine, Ser  Date Value Ref Range Status  11/12/2021 1.02 (H) 0.44 - 1.00 mg/dL Final   Creatinine, Urine  Date Value Ref Range Status  01/10/2018 14 (L) 20 - 275 mg/dL Final         Failed - Lipid Panel in normal range within the last 12 months    Cholesterol, Total  Date Value Ref Range Status  11/10/2021 133  100 - 199 mg/dL Final   Cholesterol  Date Value Ref Range Status  06/04/2011 191 0 - 200 mg/dL Final   Ldl Cholesterol, Calc  Date Value Ref Range Status  06/04/2011 106 (H) 0 - 100 mg/dL Final   LDL Cholesterol (Calc)  Date Value Ref Range Status  04/23/2020 150 (H) mg/dL (calc) Final    Comment:    Reference range: <100 . Desirable range <100 mg/dL for primary prevention;   <70 mg/dL for patients with CHD or diabetic patients  with > or = 2 CHD risk factors. Marland Kitchen LDL-C is now calculated using the Martin-Hopkins  calculation, which is a validated novel method providing  better accuracy than the Friedewald equation in the  estimation of LDL-C.  Cresenciano Genre et al. Annamaria Helling. 3976;734(19): 2061-2068  (http://education.Quest[employer].com/faq/FAQ164)    LDL Chol Calc (NIH)  Date Value Ref Range Status  11/10/2021 56 0 - 99 mg/dL Final   HDL Cholesterol  Date Value Ref Range Status  06/04/2011 34 (L) 40 - 60 mg/dL Final   HDL  Date Value Ref Range Status  11/10/2021 45 >39 mg/dL Final   Triglycerides  Date Value Ref Range Status  11/10/2021 197 (H) 0 - 149 mg/dL Final  06/04/2011 256 (H) 0 - 200 mg/dL Final         Passed - Patient is not pregnant      Passed - Valid encounter within last 12 months    Recent Outpatient Visits           2 months ago Type 2 diabetes mellitus with obesity ([place])   [place], Manuelito T, NP   3 months ago Urinary symptom or sign   [employer], [place] T, NP   6 months ago Hypertension associated with diabetes ([place])   [place], [name] T, NP   8 months ago Hypertension associated with diabetes ([place])   [place], [name] T, NP   9 months ago Type 2 diabetes mellitus with obesity (  [place])   [place], Barbaraann Faster, NP       Future Appointments             Today Venita Lick, NP [place], [facility]   In 6 days [place],  [place] T, NP [employer], P[facility]             amLODipine (NORVASC) 2.5 MG tablet [employer] Med Name: AMLODIPINE BESYLATE 2.5 MG TAB] 90 tablet 4    Sig: TAKE 1 TABLET BY MOUTH EVERY DAY     Cardiovascular: Calcium Channel Blockers 2 Passed - 02/03/2022  4:34 PM      Passed - Last BP in normal range    BP Readings from Last 1 Encounters:  02/02/22 (!) 97/57         Passed - Last Heart Rate in normal range    Pulse Readings from Last 1 Encounters:  02/02/22 78         Passed - Valid encounter within last 6 months    Recent Outpatient Visits           2 months ago Type 2 diabetes mellitus with obesity ([place])   [place], [place] T, NP   3 months ago Urinary symptom or sign   [facility] [place], [place] T, NP   6 months ago Hypertension associated with diabetes ([place])   [place] Cannady, [name] T, NP   8 months ago Hypertension associated with diabetes ([place])   [place] Cannady, [name] T, NP   9 months ago Type 2 diabetes mellitus with obesity ([place])   [place], Barbaraann Faster, NP       Future Appointments             Today Venita Lick, NP [place], [place]   In 6 days [place], Barbaraann Faster, NP [employer] [facility], [facility]

## 2022-02-04 NOTE — Progress Notes (Signed)
BP (!) 104/56   Pulse 72   Temp 97.8 F (36.6 C) (Oral)   Ht 5' 4.49" (1.638 m)   Wt 221 lb 8 oz (100.5 kg)   SpO2 96%   BMI 37.45 kg/m    Subjective:    Patient ID: Sarah [name], female    DOB: 1950/11/28, 72 y.o.   MRN: [id number]  HPI: Sarah [name] is a 72 y.o. female  Chief Complaint  Patient presents with   Abdominal Pain    Groin pain, started a few days after Christmas. Hard to urinate.   ABDOMINAL PAIN  Visit today for groin and abdominal pain since after Christmas.  Is having difficulty with urination with this.  No hematuria.    Last A1c in October 7.4%.  Duration:weeks Onset: sudden Severity: 5/10 Quality: dull and throbbing Location:  suprapubic". "lower abdominal quadrants  Episode duration: intermittent Radiation: no Frequency: intermittent Alleviating factors: nothing Aggravating factors: nothing Status: fluctuating Treatments attempted: heating pad Fever: no Nausea: no Vomiting: no Weight loss: no Decreased appetite: no Diarrhea: no Constipation: no Blood in stool: no Heartburn: no Jaundice: no Rash: no Dysuria/urinary frequency: yes Hematuria: no History of sexually transmitted disease: no Recurrent NSAID use: no   Relevant past medical, surgical, family and social history reviewed and updated as indicated. Interim medical history since our last visit reviewed. Allergies and medications reviewed and updated.  Review of Systems  Constitutional:  Negative for activity change, appetite change, diaphoresis, fatigue and fever.  Respiratory:  Negative for cough, chest tightness and shortness of breath.   Cardiovascular:  Negative for chest pain, palpitations and leg swelling.  Gastrointestinal:  Positive for abdominal pain. Negative for abdominal distention, constipation, diarrhea, nausea and vomiting.  Genitourinary:  Positive for decreased urine volume, dysuria and urgency. Negative for frequency and hematuria.  Neurological: Negative.    Psychiatric/Behavioral: Negative.      Per HPI unless specifically indicated above     Objective:    BP (!) 104/56   Pulse 72   Temp 97.8 F (36.6 C) (Oral)   Ht 5' 4.49" (1.638 m)   Wt 221 lb 8 oz (100.5 kg)   SpO2 96%   BMI 37.45 kg/m   Wt Readings from Last 3 Encounters:  02/04/22 221 lb 8 oz (100.5 kg)  11/12/21 220 lb (99.8 kg)  11/10/21 222 lb 14.4 oz (101.1 kg)    Physical Exam Vitals and nursing note reviewed.  Constitutional:      General: She is awake. She is not in acute distress.    Appearance: She is well-developed and well-groomed. She is obese. She is not ill-appearing or toxic-appearing.  HENT:     Head: Normocephalic.     Right Ear: Hearing normal.     Left Ear: Hearing normal.  Eyes:     General: Lids are normal.        Right eye: No discharge.        Left eye: No discharge.     Conjunctiva/sclera: Conjunctivae normal.     Pupils: Pupils are equal, round, and reactive to light.  Neck:     Thyroid: No thyromegaly.     Vascular: No carotid bruit.  Cardiovascular:     Rate and Rhythm: Normal rate and regular rhythm.     Heart sounds: Normal heart sounds. No murmur heard.    No gallop.  Pulmonary:     Effort: Pulmonary effort is normal. No accessory muscle usage or respiratory distress.     Breath  sounds: Normal breath sounds.  Abdominal:     General: Bowel sounds are normal. There is no distension.     Palpations: Abdomen is soft.     Tenderness: There is abdominal tenderness in the suprapubic area. There is no right CVA tenderness or left CVA tenderness.  Musculoskeletal:     Cervical back: Normal range of motion and neck supple.     Right lower leg: No edema.     Left lower leg: No edema.  Skin:    General: Skin is warm and dry.  Neurological:     Mental Status: She is alert and oriented to person, place, and time.     Deep Tendon Reflexes: Reflexes are normal and symmetric.     Reflex Scores:      Brachioradialis reflexes are 2+ on the  right side and 2+ on the left side.      Patellar reflexes are 2+ on the right side and 2+ on the left side. Psychiatric:        Attention and Perception: Attention normal.        Mood and Affect: Mood normal.        Speech: Speech normal.        Behavior: Behavior normal. Behavior is cooperative.        Thought Content: Thought content normal.     Results for orders placed or performed during the hospital encounter of 02/54/27  Basic metabolic panel  Result Value Ref Range   Sodium 140 135 - 145 mmol/L   Potassium 3.5 3.5 - 5.1 mmol/L   Chloride 108 98 - 111 mmol/L   CO2 25 22 - 32 mmol/L   Glucose, Bld 163 (H) 70 - 99 mg/dL   BUN 14 8 - 23 mg/dL   Creatinine, Ser 1.02 (H) 0.44 - 1.00 mg/dL   Calcium 8.2 (L) 8.9 - 10.3 mg/dL   GFR, Estimated 59 (L) >60 mL/min   Anion gap 7 5 - 15  CBC  Result Value Ref Range   WBC 7.8 4.0 - 10.5 K/uL   RBC 3.89 3.87 - 5.11 MIL/uL   Hemoglobin 11.2 (L) 12.0 - 15.0 g/dL   HCT 33.3 (L) 36.0 - 46.0 %   MCV 85.6 80.0 - 100.0 fL   MCH 28.8 26.0 - 34.0 pg   MCHC 33.6 30.0 - 36.0 g/dL   RDW 12.5 11.5 - 15.5 %   Platelets 162 150 - 400 K/uL   nRBC 0.0 0.0 - 0.2 %      Assessment & Plan:   Problem List Items Addressed This Visit       Other   Generalized abdominal pain - Primary    Acute to suprapubic area.  Wet prep negative.  UA trace leuks.  Will send for culture.  At this time she is symptomatic and does have some positive findings on exam.  Will start Macrobid and adjust as needed based on culture -- she is allergic to [facility] and Cipro.  Could consider Bactrim in future if needed.      Relevant Orders   Urinalysis, Routine w reflex microscopic   WET PREP FOR TRICH, YEAST, CLUE   Other Visit Diagnoses     Frequent urination       Relevant Orders   Urinalysis, Routine w reflex microscopic   WET PREP FOR TRICH, YEAST, CLUE   Pyuria       Urine sent for culture.   Relevant Orders   Urine Culture  Follow up plan: Return  for as scheduled 02/10/22.

## 2022-02-04 NOTE — Assessment & Plan Note (Signed)
Acute to suprapubic area.  Wet prep negative.  UA trace leuks.  Will send for culture.  At this time she is symptomatic and does have some positive findings on exam.  Will start Macrobid and adjust as needed based on culture -- she is allergic to [facility] and Cipro.  Could consider Bactrim in future if needed.

## 2022-02-04 NOTE — Telephone Encounter (Signed)
Next visit is 02/17/22. Requesting refill on her Belsomra 20 mg called to:  [id number]

## 2022-02-05 ENCOUNTER — Other Ambulatory Visit: Payer: Self-pay | Admitting: Nurse Practitioner

## 2022-02-05 NOTE — Telephone Encounter (Signed)
D/C 05/30/21.

## 2022-02-06 NOTE — Progress Notes (Signed)
Contacted via [employer] I sent your urine for culture and started antibiotic.  There is growth of bacteria >100,000 so far, which is level we treat at.  I am waiting on final culture to ensure Macrobid susceptible to this:)

## 2022-02-08 ENCOUNTER — Other Ambulatory Visit: Payer: Self-pay | Admitting: Nurse Practitioner

## 2022-02-08 LAB — URINE CULTURE

## 2022-02-08 MED ORDER — CEPHALEXIN 500 MG PO CAPS: 500.0000 mg | ORAL_CAPSULE | Freq: Two times a day (BID) | ORAL | 0 refills | Status: AC

## 2022-02-08 NOTE — Patient Instructions (Incomplete)
Diabetes Mellitus Basics  Diabetes mellitus, or diabetes, is a long-term (chronic) disease. It occurs when the body does not properly use sugar (glucose) that is released from food after you eat. Diabetes mellitus may be caused by one or both of these problems: Your pancreas does not make enough of a hormone called insulin. Your body does not react in a normal way to the insulin that it makes. Insulin lets glucose enter cells in your body. This gives you energy. If you have diabetes, glucose cannot get into cells. This causes high blood glucose (hyperglycemia). How to treat and manage diabetes You may need to take insulin or other diabetes medicines daily to keep your glucose in balance. If you are prescribed insulin, you will learn how to give yourself insulin by injection. You may need to adjust the amount of insulin you take based on the foods that you eat. You will need to check your blood glucose levels using a glucose monitor as told by your health care provider. The readings can help determine if you have low or high blood glucose. Generally, you should have these blood glucose levels: Before meals (preprandial): 80-130 mg/dL (4.4-7.2 mmol/L). After meals (postprandial): below 180 mg/dL (10 mmol/L). Hemoglobin A1c (HbA1c) level: less than 7%. Your health care provider will set treatment goals for you. Keep all follow-up visits. This is important. Follow these instructions at home: Diabetes medicines Take your diabetes medicines every day as told by your health care provider. List your diabetes medicines here: Name of medicine: ______________________________ Amount (dose): _______________ Time (a.m./p.m.): _______________ Notes: ___________________________________ Name of medicine: ______________________________ Amount (dose): _______________ Time (a.m./p.m.): _______________ Notes: ___________________________________ Name of medicine: ______________________________ Amount (dose):  _______________ Time (a.m./p.m.): _______________ Notes: ___________________________________ Insulin If you use insulin, list the types of insulin you use here: Insulin type: ______________________________ Amount (dose): _______________ Time (a.m./p.m.): _______________Notes: ___________________________________ Insulin type: ______________________________ Amount (dose): _______________ Time (a.m./p.m.): _______________ Notes: ___________________________________ Insulin type: ______________________________ Amount (dose): _______________ Time (a.m./p.m.): _______________ Notes: ___________________________________ Insulin type: ______________________________ Amount (dose): _______________ Time (a.m./p.m.): _______________ Notes: ___________________________________ Insulin type: ______________________________ Amount (dose): _______________ Time (a.m./p.m.): _______________ Notes: ___________________________________ Managing blood glucose  Check your blood glucose levels using a glucose monitor as told by your health care provider. Write down the times that you check your glucose levels here: Time: _______________ Notes: ___________________________________ Time: _______________ Notes: ___________________________________ Time: _______________ Notes: ___________________________________ Time: _______________ Notes: ___________________________________ Time: _______________ Notes: ___________________________________ Time: _______________ Notes: ___________________________________  Low blood glucose Low blood glucose (hypoglycemia) is when glucose is at or below 70 mg/dL (3.9 mmol/L). Symptoms may include: Feeling: Hungry. Sweaty and clammy. Irritable or easily upset. Dizzy. Sleepy. Having: A fast heartbeat. A headache. A change in your vision. Numbness around the mouth, lips, or tongue. Having trouble with: Moving (coordination). Sleeping. Treating low blood glucose To treat low blood  glucose, eat or drink something containing sugar right away. If you can think clearly and swallow safely, follow the 15:15 rule: Take 15 grams of a fast-acting carb (carbohydrate), as told by your health care provider. Some fast-acting carbs are: Glucose tablets: take 3-4 tablets. Hard candy: eat 3-5 pieces. Fruit juice: drink 4 oz (120 mL). Regular (not diet) soda: drink 4-6 oz (120-180 mL). Honey or sugar: eat 1 Tbsp (15 mL). Check your blood glucose levels 15 minutes after you take the carb. If your glucose is still at or below 70 mg/dL (3.9 mmol/L), take 15 grams of a carb again. If your glucose does not go above 70 mg/dL (3.9 mmol/L) after   3 tries, get help right away. After your glucose goes back to normal, eat a meal or a snack within 1 hour. Treating very low blood glucose If your glucose is at or below 54 mg/dL (3 mmol/L), you have very low blood glucose (severe hypoglycemia). This is an emergency. Do not wait to see if the symptoms will go away. Get medical help right away. Call your local emergency services (911 in the [place] Do not drive yourself to the hospital. Questions to ask your health care provider Should I talk with a diabetes educator? What equipment will I need to care for myself at home? What diabetes medicines do I need? When should I take them? How often do I need to check my blood glucose levels? What number can I call if I have questions? When is my follow-up visit? Where can I find a support group for people with diabetes? Where to find more information American Diabetes Association: www.diabetes.org Association of Diabetes Care and Education Specialists: www.diabeteseducator.org Contact a health care provider if: Your blood glucose is at or above 240 mg/dL (13.3 mmol/L) for 2 days in a row. You have been sick or have had a fever for 2 days or more, and you are not getting better. You have any of these problems for more than 6 hours: You cannot eat or  drink. You feel nauseous. You vomit. You have diarrhea. Get help right away if: Your blood glucose is lower than 54 mg/dL (3 mmol/L). You get confused. You have trouble thinking clearly. You have trouble breathing. These symptoms may represent a serious problem that is an emergency. Do not wait to see if the symptoms will go away. Get medical help right away. Call your local emergency services (911 in the [place] Do not drive yourself to the hospital. Summary Diabetes mellitus is a chronic disease that occurs when the body does not properly use sugar (glucose) that is released from food after you eat. Take insulin and diabetes medicines as told. Check your blood glucose every day, as often as told. Keep all follow-up visits. This is important. This information is not intended to replace advice given to you by your health care provider. Make sure you discuss any questions you have with your health care provider. Document Revised: 05/09/2019 Document Reviewed: 05/09/2019 Elsevier Patient Education  2023 Elsevier Inc.  

## 2022-02-08 NOTE — Progress Notes (Signed)
Contacted via [place] day [place] is resistant to what is growing in your urine, meaning it will not work to treat it.  I could try Bactrim, but it could cause severe reaction with your Lexapro, so can not do that.  You are allergic to Penicillin and Cipro.  So I am sending in Keflex, if any issues with this please let me know.  Any questions? Keep being amazing!!  Thank you for allowing me to participate in your care.  I appreciate you. Kindest regards, Vernor Monnig

## 2022-02-10 ENCOUNTER — Ambulatory Visit: Payer: Medicare Other | Admitting: Unknown Physician Specialty

## 2022-02-10 ENCOUNTER — Other Ambulatory Visit: Payer: Self-pay | Admitting: Nurse Practitioner

## 2022-02-10 MED ORDER — MECLIZINE HCL 25 MG PO TABS: 25.0000 mg | ORAL_TABLET | Freq: Three times a day (TID) | ORAL | 0 refills | Status: DC | PRN

## 2022-02-10 NOTE — Telephone Encounter (Signed)
Medication Refill - Medication: meclizine (ANTIVERT) 25 MG tablet   Has the patient contacted their pharmacy? No. (Agent: If no, request that the patient contact the pharmacy for the refill. If patient does not wish to contact the pharmacy document the reason why and proceed with request.) (Agent: If yes, when and what did the pharmacy advise?)  Preferred Pharmacy (with phone number or street name):  [id number] Phone: [phone] Fax: [phone]    Has the patient been seen for an appointment in the last year OR does the patient have an upcoming appointment? Yes.    Agent: Please be advised that RX refills may take up to 3 business days. We ask that you follow-up with your pharmacy.

## 2022-02-10 NOTE — Telephone Encounter (Signed)
Requested medication (s) are due for refill today - yes  Requested medication (s) are on the active medication list -yes  Future visit scheduled -yes  Last refill: 09/02/21 #30  Notes to clinic: non delegated Rx  Requested Prescriptions  Pending Prescriptions Disp Refills   meclizine (ANTIVERT) 25 MG tablet 30 tablet 0    Sig: Take 1 tablet (25 mg total) by mouth 3 (three) times daily as needed for dizziness.     Not Delegated - Gastroenterology: Antiemetics Failed - 02/10/2022 11:51 AM      Failed - This refill cannot be delegated      Passed - Valid encounter within last 6 months    Recent Outpatient Visits           6 days ago Generalized abdominal pain   [place], [place] T, NP   3 months ago Type 2 diabetes mellitus with obesity ([place])   [place], [place] T, NP   3 months ago Urinary symptom or sign   [place], [place] T, NP   7 months ago Hypertension associated with diabetes ([place])   [place], Henrine Screws T, NP   8 months ago Hypertension associated with diabetes [facility])   [place], Henrine Screws T, NP       Future Appointments             In 1 week Venita Lick, NP [place], [facility]               Requested Prescriptions  Pending Prescriptions Disp Refills   meclizine (ANTIVERT) 25 MG tablet 30 tablet 0    Sig: Take 1 tablet (25 mg total) by mouth 3 (three) times daily as needed for dizziness.     Not Delegated - Gastroenterology: Antiemetics Failed - 02/10/2022 11:51 AM      Failed - This refill cannot be delegated      Passed - Valid encounter within last 6 months    Recent Outpatient Visits           6 days ago Generalized abdominal pain   [place], [place] T, NP   3 months ago Type 2 diabetes mellitus with obesity ([place])    [place], [place] T, NP   3 months ago Urinary symptom or sign   [place], [place] T, NP   7 months ago Hypertension associated with diabetes ([place])   [place], Henrine Screws T, NP   8 months ago Hypertension associated with diabetes [facility])   [place], Barbaraann Faster, NP       Future Appointments             In 1 week [place], Barbaraann Faster, NP [place], [facility]

## 2022-02-11 ENCOUNTER — Ambulatory Visit: Payer: Medicare Other | Admitting: Nurse Practitioner

## 2022-02-17 ENCOUNTER — Ambulatory Visit (INDEPENDENT_AMBULATORY_CARE_PROVIDER_SITE_OTHER): Payer: Medicare Other | Admitting: Behavioral Health

## 2022-02-17 ENCOUNTER — Encounter: Payer: Self-pay | Admitting: Behavioral Health

## 2022-02-17 DIAGNOSIS — F3341 Major depressive disorder, recurrent, in partial remission: Secondary | ICD-10-CM

## 2022-02-17 DIAGNOSIS — F99 Mental disorder, not otherwise specified: Secondary | ICD-10-CM

## 2022-02-17 DIAGNOSIS — F5105 Insomnia due to other mental disorder: Secondary | ICD-10-CM | POA: Diagnosis not present

## 2022-02-17 DIAGNOSIS — F411 Generalized anxiety disorder: Secondary | ICD-10-CM

## 2022-02-17 MED ORDER — BELSOMRA 20 MG PO TABS: ORAL_TABLET | ORAL | 3 refills | Status: DC

## 2022-02-17 MED ORDER — AMITRIPTYLINE HCL 50 MG PO TABS: 50.0000 mg | ORAL_TABLET | Freq: Every day | ORAL | 1 refills | Status: DC

## 2022-02-17 MED ORDER — ESCITALOPRAM OXALATE 10 MG PO TABS: 10.0000 mg | ORAL_TABLET | Freq: Every day | ORAL | 1 refills | Status: DC

## 2022-02-17 NOTE — Progress Notes (Signed)
[facility] Med Check  Patient ID: Sarah [name],  MRN: [id number]  PCP: Venita Lick, NP  Date of Evaluation: 02/17/2022 Time spent:30 minutes  Chief Complaint:  Chief Complaint   Depression; Anxiety; Follow-up; Patient Education; Medication Refill     HISTORY/CURRENT STATUS: HPI  72 year old female presents to this office for follow up and medication management.  No changes this visit. She says that she continues to feel stable with Lexapro and does not need any changes to her medications at this time.  She is now living in a retirement community. She reports her anxiety at 1/10 and depression 1/10. She is sleeping well at 7-8 hours at night with aid of Ambien but would like to try a medication that is deemed safer for 65 and older. Denies mania, no psychosis. No SI/HI.    No previous medication failures noted this visit.     Individual Medical History/ Review of Systems: Changes? :No   Allergies: Doxycycline, Ciprofloxacin, and Penicillins  Current Medications:  Current Outpatient Medications:    amitriptyline (ELAVIL) 50 MG tablet, Take 1 tablet (50 mg total) by mouth at bedtime., Disp: 90 tablet, Rfl: 1   amLODipine (NORVASC) 2.5 MG tablet, Take 1 tablet (2.5 mg total) by mouth daily., Disp: 90 tablet, Rfl: 4   Baclofen 5 MG TABS, TAKE 1 TABLET BY MOUTH EVERY 8 HOURS AS NEEDED, Disp: 45 tablet, Rfl: 0   Cholecalciferol (VITAMIN D) 50 MCG (2000 UT) CAPS, Take by mouth., Disp: , Rfl:    Cyanocobalamin (VITAMIN B-12 PO), Take by mouth daily., Disp: , Rfl:    escitalopram (LEXAPRO) 10 MG tablet, Take 1 tablet (10 mg total) by mouth daily., Disp: 90 tablet, Rfl: 1   furosemide (LASIX) 40 MG tablet, Take 1 tablet (40 mg total) by mouth daily., Disp: 90 tablet, Rfl: 4   hydrocortisone (CORTEF) 10 MG tablet, 10 mg daily. 15 mg in the am and 5 mg in the pm, Disp: , Rfl: 3   losartan (COZAAR) 50 MG tablet, Take 1 tablet (50 mg total) by mouth daily., Disp: 90 tablet, Rfl: 4    meclizine (ANTIVERT) 25 MG tablet, Take 1 tablet (25 mg total) by mouth 3 (three) times daily as needed for dizziness., Disp: 30 tablet, Rfl: 0   pregabalin (LYRICA) 150 MG capsule, 150 mg in am and 300 mg in pm, Disp: , Rfl:    rosuvastatin (CRESTOR) 10 MG tablet, Take 1 tablet (10 mg total) by mouth daily., Disp: 90 tablet, Rfl: 3   Suvorexant (BELSOMRA) 20 MG TABS, Take one tablet by mouth at bedtime, Disp: 30 tablet, Rfl: 3 Medication Side Effects: none  Family Medical/ Social History: Changes? No  MENTAL HEALTH EXAM:  There were no vitals taken for this visit.There is no height or weight on file to calculate BMI.  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 Generalized anxiety disorder  F41.1 escitalopram (LEXAPRO) 10 MG tablet    3. Recurrent major depressive disorder, in partial remission (HCC)  F33.41 escitalopram (LEXAPRO) 10 MG tablet      Receiving Psychotherapy: No    RECOMMENDATIONS:   Greater than 50% of 30 min  face to face time with patient was spent on counseling and coordination of care. No changes this visit.  We discussed her current stability with her current medications. She is reporting very low levels of anxiety and depression with Lexapro. No changes are indicated at this time.  Reinforced documented previous and current flagged warning of  QT prolongation and risk with Lexapro. She has been on the medication as reported by Pt since 2018.    To continue Lexapro 10 mg daily To continue Belsomra 20 mg at bedtime To continue Amitriptyline 50 mg at bedtime Will report any worsening symptoms promptly To follow up in 3 months to reassess per pt request Provided emergency contact information Reviewed [place], NP

## 2022-02-23 ENCOUNTER — Ambulatory Visit: Payer: Medicare Other | Admitting: Nurse Practitioner

## 2022-02-25 ENCOUNTER — Other Ambulatory Visit: Payer: Self-pay | Admitting: Nurse Practitioner

## 2022-02-26 ENCOUNTER — Other Ambulatory Visit: Payer: Self-pay | Admitting: Nurse Practitioner

## 2022-02-26 MED ORDER — BACLOFEN 5 MG PO TABS: 1.0000 | ORAL_TABLET | Freq: Three times a day (TID) | ORAL | 2 refills | Status: DC | PRN

## 2022-02-26 MED ORDER — FUROSEMIDE 40 MG PO TABS: 40.0000 mg | ORAL_TABLET | Freq: Every day | ORAL | 4 refills | Status: DC

## 2022-02-26 MED ORDER — LOSARTAN POTASSIUM 50 MG PO TABS: 50.0000 mg | ORAL_TABLET | Freq: Every day | ORAL | 4 refills | Status: DC

## 2022-02-26 MED ORDER — MECLIZINE HCL 25 MG PO TABS: 25.0000 mg | ORAL_TABLET | Freq: Three times a day (TID) | ORAL | 2 refills | Status: DC | PRN

## 2022-02-26 MED ORDER — AMLODIPINE BESYLATE 2.5 MG PO TABS: 2.5000 mg | ORAL_TABLET | Freq: Every day | ORAL | 4 refills | Status: DC

## 2022-02-26 MED ORDER — ROSUVASTATIN CALCIUM 10 MG PO TABS: 10.0000 mg | ORAL_TABLET | Freq: Every day | ORAL | 4 refills | Status: DC

## 2022-02-26 NOTE — Telephone Encounter (Signed)
Requested medication (s) are due for refill today:   Yes    Antivert to be reviewed by provider non delegated  Requested medication (s) are on the active medication list:   Yes for all 6  Future visit scheduled:   Yes 03/09/2022   Last ordered: Lasix 02/04/2021 #90, 4 refills;   rosuvastatin 05/30/2021 #90, 3 refills;   Amlodipine 05/30/2021 #90, 4 refills;   Baclofen 02/05/2022 #45, 0 refills;   Antivert 02/10/2022 #30, 0 refills;     The losartan listed is for 100 mg but this was discontinued 05/30/2021.  Looks like the dose was changed to 50 mg.    Antivert is a non delegated refill.    Requested Prescriptions  Pending Prescriptions Disp Refills   furosemide (LASIX) 40 MG tablet [Pharmacy Med Name: FUROSEMIDE 40 MG TABLET] 90 tablet 4    Sig: TAKE 1 TABLET BY MOUTH EVERY DAY     Cardiovascular:  Diuretics - Loop Failed - 02/25/2022  2:06 PM      Failed - Ca in normal range and within 180 days    Calcium  Date Value Ref Range Status  11/12/2021 8.2 (L) 8.9 - 10.3 mg/dL Final   Calcium, Total  Date Value Ref Range Status  06/04/2011 7.5 (L) 8.5 - 10.1 mg/dL Final   Calcium, Ion  Date Value Ref Range Status  08/25/2010 1.15 1.12 - 1.32 mmol/L Final         Failed - Cr in normal range and within 180 days    Creat  Date Value Ref Range Status  04/23/2020 0.89 0.50 - 0.99 mg/dL Final    Comment:    For patients >81 years of age, the reference limit for Creatinine is approximately 13% higher for people identified as African-American. .    Creatinine, Ser  Date Value Ref Range Status  11/12/2021 1.02 (H) 0.44 - 1.00 mg/dL Final   Creatinine, Urine  Date Value Ref Range Status  01/10/2018 14 (L) 20 - 275 mg/dL Final         Failed - Mg Level in normal range and within 180 days    Magnesium  Date Value Ref Range Status  09/04/2017 1.5 (L) 1.7 - 2.4 mg/dL Final    Comment:    Performed at [facility], [place] [address], [place], [place] [address]  06/04/2011  1.9 mg/dL Final    Comment:    1.8-2.4 THERAPEUTIC RANGE: 4-7 mg/dL TOXIC: > 10 mg/dL  -----------------------          Passed - K in normal range and within 180 days    Potassium  Date Value Ref Range Status  11/12/2021 3.5 3.5 - 5.1 mmol/L Final  06/04/2011 3.4 (L) 3.5 - 5.1 mmol/L Final         Passed - Na in normal range and within 180 days    Sodium  Date Value Ref Range Status  11/12/2021 140 135 - 145 mmol/L Final  11/10/2021 139 134 - 144 mmol/L Final  06/04/2011 146 (H) 136 - 145 mmol/L Final         Passed - Cl in normal range and within 180 days    Chloride  Date Value Ref Range Status  11/12/2021 108 98 - 111 mmol/L Final  06/04/2011 110 (H) 98 - 107 mmol/L Final         Passed - Last BP in normal range    BP Readings from Last 1 Encounters:  02/04/22 (!) 104/56  Passed - Valid encounter within last 6 months    Recent Outpatient Visits           3 weeks ago Generalized abdominal pain   [place] Cut[place], Playita T, NP   3 months ago Type 2 diabetes mellitus with obesity ([place])   [place]ster, Henrine Screws T, NP   4 months ago Urinary symptom or sign   [place], Finley T, NP   7 months ago Hypertension associated with diabetes ([place])   [place] Howell, Henrine Screws T, NP   9 months ago Hypertension associated with diabetes ([place])   [place], Henrine Screws T, NP       Future Appointments             In 1 week Cannady, Barbaraann Faster, NP [place], [facility]             rosuvastatin (CRESTOR) 10 MG tablet [Pharmacy Med Name: ROSUVASTATIN CALCIUM 10 MG TAB] 90 tablet 3    Sig: TAKE 1 TABLET BY MOUTH EVERY DAY     Cardiovascular:  Antilipid - Statins 2 Failed - 02/25/2022  2:06 PM      Failed - Cr in normal range and within 360 days    Creat  Date Value Ref Range Status  04/23/2020  0.89 0.50 - 0.99 mg/dL Final    Comment:    For patients >22 years of age, the reference limit for Creatinine is approximately 13% higher for people identified as African-American. .    Creatinine, Ser  Date Value Ref Range Status  11/12/2021 1.02 (H) 0.44 - 1.00 mg/dL Final   Creatinine, Urine  Date Value Ref Range Status  01/10/2018 14 (L) 20 - 275 mg/dL Final         Failed - Lipid Panel in normal range within the last 12 months    Cholesterol, Total  Date Value Ref Range Status  11/10/2021 133 100 - 199 mg/dL Final   Cholesterol  Date Value Ref Range Status  06/04/2011 191 0 - 200 mg/dL Final   Ldl Cholesterol, Calc  Date Value Ref Range Status  06/04/2011 106 (H) 0 - 100 mg/dL Final   LDL Cholesterol (Calc)  Date Value Ref Range Status  04/23/2020 150 (H) mg/dL (calc) Final    Comment:    Reference range: <100 . Desirable range <100 mg/dL for primary prevention;   <70 mg/dL for patients with CHD or diabetic patients  with > or = 2 CHD risk factors. Marland Kitchen LDL-C is now calculated using the Martin-Hopkins  calculation, which is a validated novel method providing  better accuracy than the Friedewald equation in the  estimation of LDL-C.  Cresenciano Genre et al. Annamaria Helling. 5329;924(26): 2061-2068  (http://education.[employer].com/faq/FAQ164)    LDL Chol Calc (NIH)  Date Value Ref Range Status  11/10/2021 56 0 - 99 mg/dL Final   HDL Cholesterol  Date Value Ref Range Status  06/04/2011 34 (L) 40 - 60 mg/dL Final   HDL  Date Value Ref Range Status  11/10/2021 45 >39 mg/dL Final   Triglycerides  Date Value Ref Range Status  11/10/2021 197 (H) 0 - 149 mg/dL Final  06/04/2011 256 (H) 0 - 200 mg/dL Final         Passed - Patient is not pregnant      Passed - Valid encounter within last 12 months    Recent  Outpatient Visits           3 weeks ago Generalized abdominal pain   [place], [place] T, NP   3 months ago Type 2  diabetes mellitus with obesity ([place])   [place], Henrine Screws T, NP   4 months ago Urinary symptom or sign   [place], [place] T, NP   7 months ago Hypertension associated with diabetes ([place])   [place], Henrine Screws T, NP   9 months ago Hypertension associated with diabetes ([place])   [place], Henrine Screws T, NP       Future Appointments             In 1 week Cannady, Barbaraann Faster, NP [place], [facility]             amLODipine (NORVASC) 2.5 MG tablet [Pharmacy Med Name: AMLODIPINE BESYLATE 2.5 MG TAB] 90 tablet 4    Sig: TAKE 1 TABLET BY MOUTH EVERY DAY     Cardiovascular: Calcium Channel Blockers 2 Passed - 02/25/2022  2:06 PM      Passed - Last BP in normal range    BP Readings from Last 1 Encounters:  02/04/22 (!) 104/56         Passed - Last Heart Rate in normal range    Pulse Readings from Last 1 Encounters:  02/04/22 72         Passed - Valid encounter within last 6 months    Recent Outpatient Visits           3 weeks ago Generalized abdominal pain   [place], [place] T, NP   3 months ago Type 2 diabetes mellitus with obesity ([place])   [place] Optima, [place] T, NP   4 months ago Urinary symptom or sign   [place], [place] T, NP   7 months ago Hypertension associated with diabetes ([place])   [place], [place]gwick T, NP   9 months ago Hypertension associated with diabetes ([place])   [place], Henrine Screws T, NP       Future Appointments             In 1 week Cannady, Barbaraann Faster, NP [place], [facility]             losartan (COZAAR) 100 MG tablet [Pharmacy Med Name: LOSARTAN POTASSIUM 100 MG TAB] 90 tablet 4    Sig: TAKE 1 TABLET BY MOUTH EVERY  DAY     Cardiovascular:  Angiotensin Receptor Blockers Failed - 02/25/2022  2:06 PM      Failed - Cr in normal range and within 180 days    Creat  Date Value Ref Range Status  04/23/2020 0.89 0.50 - 0.99 mg/dL Final    Comment:    For patients >47 years of age, the reference limit for Creatinine is approximately 13% higher for people identified as African-American. .    Creatinine, Ser  Date Value Ref Range Status  11/12/2021 1.02 (H) 0.44 - 1.00 mg/dL Final   Creatinine, Urine  Date Value Ref Range Status  01/10/2018 14 (L) 20 - 275 mg/dL Final         Passed - K in normal range and within 180 days    Potassium  Date Value Ref Range Status  11/12/2021 3.5 3.5 - 5.1 mmol/L Final  06/04/2011 3.4 (L) 3.5 - 5.1 mmol/L Final         Passed - Patient is not pregnant      Passed - Last BP in normal range    BP Readings from Last 1 Encounters:  02/04/22 (!) 104/56         Passed - Valid encounter within last 6 months    Recent Outpatient Visits           3 weeks ago Generalized abdominal pain   [place], [place] T, NP   3 months ago Type 2 diabetes mellitus with obesity ([place])   [place] Bric[place], [place] T, NP   4 months ago Urinary symptom or sign   [place], Durand T, NP   7 months ago Hypertension associated with diabetes ([place])   [place], H[place]hoe Bay T, NP   9 months ago Hypertension associated with diabetes ([place])   [place], Henrine Screws T, NP       Future Appointments             In 1 week Cannady, Barbaraann Faster, NP [place], [facility]             Baclofen 5 MG TABS [Pharmacy Med Name: BACLOFEN 5 MG TABLET] 45 tablet 0    Sig: TAKE 1 TABLET BY MOUTH EVERY 8 HOURS AS NEEDED     Analgesics:  Muscle Relaxants - baclofen Failed - 02/25/2022  2:06 PM      Failed - Cr in normal range  and within 180 days    Creat  Date Value Ref Range Status  04/23/2020 0.89 0.50 - 0.99 mg/dL Final    Comment:    For patients >59 years of age, the reference limit for Creatinine is approximately 13% higher for people identified as African-American. .    Creatinine, Ser  Date Value Ref Range Status  11/12/2021 1.02 (H) 0.44 - 1.00 mg/dL Final   Creatinine, Urine  Date Value Ref Range Status  01/10/2018 14 (L) 20 - 275 mg/dL Final         Passed - eGFR is 30 or above and within 180 days    GFR, Est African American  Date Value Ref Range Status  04/23/2020 77 > OR = 60 mL/min/1.42m Final   GFR, Est Non African American  Date Value Ref Range Status  04/23/2020 67 > OR = 60 mL/min/1.778mFinal   GFR, Estimated  Date Value Ref Range Status  11/12/2021 59 (L) >60 mL/min Final    Comment:    (NOTE) Calculated using the CKD-EPI Creatinine Equation (2021)    eGFR  Date Value Ref Range Status  11/10/2021 57 (L) >59 mL/min/1.73 Final         Passed - Valid encounter within last 6 months    Recent Outpatient Visits           3 weeks ago Generalized abdominal pain   [place], NP   3 months ago Type 2 diabetes mellitus with obesity ([place]  [place], NP   4 months ago Urinary symptom or sign   [place], NP   7 months ago Hypertension associated with diabetes ([place]  [place], [place] T, NP   9 months ago Hypertension associated with diabetes [facility])   [place], Barbaraann Faster, NP       Future Appointments             In 1 week Cannady, Barbaraann Faster, NP [place], [facility]             meclizine (ANTIVERT) 25 MG tablet [Pharmacy Med Name: MECLIZINE 25 MG TABLET] 30 tablet 0    Sig: TAKE 1 TABLET BY MOUTH 3 TIMES DAILY AS NEEDED FOR DIZZINESS.      Not Delegated - Gastroenterology: Antiemetics Failed - 02/25/2022  2:06 PM      Failed - This refill cannot be delegated      Passed - Valid encounter within last 6 months    Recent Outpatient Visits           3 weeks ago Generalized abdominal pain   [place], [place] T, NP   3 months ago Type 2 diabetes mellitus with obesity ([place])   [place], Iyanbito T, NP   4 months ago Urinary symptom or sign   [place], [place] T, NP   7 months ago Hypertension associated with diabetes ([place])   [place], Henrine Screws T, NP   9 months ago Hypertension associated with diabetes [facility])   [place], Barbaraann Faster, NP       Future Appointments             In 1 week Cannady, Barbaraann Faster, NP [place], [facility]

## 2022-03-08 NOTE — Patient Instructions (Incomplete)
Diabetes Mellitus Basics  Diabetes mellitus, or diabetes, is a long-term (chronic) disease. It occurs when the body does not properly use sugar (glucose) that is released from food after you eat. Diabetes mellitus may be caused by one or both of these problems: Your pancreas does not make enough of a hormone called insulin. Your body does not react in a normal way to the insulin that it makes. Insulin lets glucose enter cells in your body. This gives you energy. If you have diabetes, glucose cannot get into cells. This causes high blood glucose (hyperglycemia). How to treat and manage diabetes You may need to take insulin or other diabetes medicines daily to keep your glucose in balance. If you are prescribed insulin, you will learn how to give yourself insulin by injection. You may need to adjust the amount of insulin you take based on the foods that you eat. You will need to check your blood glucose levels using a glucose monitor as told by your health care provider. The readings can help determine if you have low or high blood glucose. Generally, you should have these blood glucose levels: Before meals (preprandial): 80-130 mg/dL (4.4-7.2 mmol/L). After meals (postprandial): below 180 mg/dL (10 mmol/L). Hemoglobin A1c (HbA1c) level: less than 7%. Your health care provider will set treatment goals for you. Keep all follow-up visits. This is important. Follow these instructions at home: Diabetes medicines Take your diabetes medicines every day as told by your health care provider. List your diabetes medicines here: Name of medicine: ______________________________ Amount (dose): _______________ Time (a.m./p.m.): _______________ Notes: ___________________________________ Name of medicine: ______________________________ Amount (dose): _______________ Time (a.m./p.m.): _______________ Notes: ___________________________________ Name of medicine: ______________________________ Amount (dose):  _______________ Time (a.m./p.m.): _______________ Notes: ___________________________________ Insulin If you use insulin, list the types of insulin you use here: Insulin type: ______________________________ Amount (dose): _______________ Time (a.m./p.m.): _______________Notes: ___________________________________ Insulin type: ______________________________ Amount (dose): _______________ Time (a.m./p.m.): _______________ Notes: ___________________________________ Insulin type: ______________________________ Amount (dose): _______________ Time (a.m./p.m.): _______________ Notes: ___________________________________ Insulin type: ______________________________ Amount (dose): _______________ Time (a.m./p.m.): _______________ Notes: ___________________________________ Insulin type: ______________________________ Amount (dose): _______________ Time (a.m./p.m.): _______________ Notes: ___________________________________ Managing blood glucose  Check your blood glucose levels using a glucose monitor as told by your health care provider. Write down the times that you check your glucose levels here: Time: _______________ Notes: ___________________________________ Time: _______________ Notes: ___________________________________ Time: _______________ Notes: ___________________________________ Time: _______________ Notes: ___________________________________ Time: _______________ Notes: ___________________________________ Time: _______________ Notes: ___________________________________  Low blood glucose Low blood glucose (hypoglycemia) is when glucose is at or below 70 mg/dL (3.9 mmol/L). Symptoms may include: Feeling: Hungry. Sweaty and clammy. Irritable or easily upset. Dizzy. Sleepy. Having: A fast heartbeat. A headache. A change in your vision. Numbness around the mouth, lips, or tongue. Having trouble with: Moving (coordination). Sleeping. Treating low blood glucose To treat low blood  glucose, eat or drink something containing sugar right away. If you can think clearly and swallow safely, follow the 15:15 rule: Take 15 grams of a fast-acting carb (carbohydrate), as told by your health care provider. Some fast-acting carbs are: Glucose tablets: take 3-4 tablets. Hard candy: eat 3-5 pieces. Fruit juice: drink 4 oz (120 mL). Regular (not diet) soda: drink 4-6 oz (120-180 mL). Honey or sugar: eat 1 Tbsp (15 mL). Check your blood glucose levels 15 minutes after you take the carb. If your glucose is still at or below 70 mg/dL (3.9 mmol/L), take 15 grams of a carb again. If your glucose does not go above 70 mg/dL (3.9 mmol/L) after   3 tries, get help right away. After your glucose goes back to normal, eat a meal or a snack within 1 hour. Treating very low blood glucose If your glucose is at or below 54 mg/dL (3 mmol/L), you have very low blood glucose (severe hypoglycemia). This is an emergency. Do not wait to see if the symptoms will go away. Get medical help right away. Call your local emergency services (911 in the [place] Do not drive yourself to the hospital. Questions to ask your health care provider Should I talk with a diabetes educator? What equipment will I need to care for myself at home? What diabetes medicines do I need? When should I take them? How often do I need to check my blood glucose levels? What number can I call if I have questions? When is my follow-up visit? Where can I find a support group for people with diabetes? Where to find more information American Diabetes Association: www.diabetes.org Association of Diabetes Care and Education Specialists: www.diabeteseducator.org Contact a health care provider if: Your blood glucose is at or above 240 mg/dL (13.3 mmol/L) for 2 days in a row. You have been sick or have had a fever for 2 days or more, and you are not getting better. You have any of these problems for more than 6 hours: You cannot eat or  drink. You feel nauseous. You vomit. You have diarrhea. Get help right away if: Your blood glucose is lower than 54 mg/dL (3 mmol/L). You get confused. You have trouble thinking clearly. You have trouble breathing. These symptoms may represent a serious problem that is an emergency. Do not wait to see if the symptoms will go away. Get medical help right away. Call your local emergency services (911 in the [place] Do not drive yourself to the hospital. Summary Diabetes mellitus is a chronic disease that occurs when the body does not properly use sugar (glucose) that is released from food after you eat. Take insulin and diabetes medicines as told. Check your blood glucose every day, as often as told. Keep all follow-up visits. This is important. This information is not intended to replace advice given to you by your health care provider. Make sure you discuss any questions you have with your health care provider. Document Revised: 05/09/2019 Document Reviewed: 05/09/2019 Elsevier Patient Education  2023 Elsevier Inc.  

## 2022-03-09 ENCOUNTER — Ambulatory Visit: Payer: Medicare Other | Admitting: Nurse Practitioner

## 2022-03-09 DIAGNOSIS — Z9221 Personal history of antineoplastic chemotherapy: Secondary | ICD-10-CM

## 2022-03-09 DIAGNOSIS — Z85820 Personal history of malignant melanoma of skin: Secondary | ICD-10-CM

## 2022-03-09 DIAGNOSIS — F3341 Major depressive disorder, recurrent, in partial remission: Secondary | ICD-10-CM

## 2022-03-09 DIAGNOSIS — M85851 Other specified disorders of bone density and structure, right thigh: Secondary | ICD-10-CM

## 2022-03-09 DIAGNOSIS — E1159 Type 2 diabetes mellitus with other circulatory complications: Secondary | ICD-10-CM

## 2022-03-09 DIAGNOSIS — G6289 Other specified polyneuropathies: Secondary | ICD-10-CM

## 2022-03-09 DIAGNOSIS — E271 Primary adrenocortical insufficiency: Secondary | ICD-10-CM

## 2022-03-09 DIAGNOSIS — E1169 Type 2 diabetes mellitus with other specified complication: Secondary | ICD-10-CM

## 2022-03-09 DIAGNOSIS — E559 Vitamin D deficiency, unspecified: Secondary | ICD-10-CM

## 2022-03-09 DIAGNOSIS — F99 Mental disorder, not otherwise specified: Secondary | ICD-10-CM

## 2022-03-09 DIAGNOSIS — E538 Deficiency of other specified B group vitamins: Secondary | ICD-10-CM

## 2022-03-14 NOTE — Patient Instructions (Signed)
Osteopenia  Osteopenia is a loss of thickness (density) inside the bones. Another name for osteopenia is low bone mass. Mild osteopenia is a normal part of aging. It is not a disease, and it does not cause symptoms. However, if you have osteopenia and continue to lose bone mass, you could develop a condition that causes the bones to become thin and break more easily (osteoporosis). Osteoporosis can cause you to lose some height, have back pain, and have a stooped posture. Although osteopenia is not a disease, making changes to your lifestyle and diet can help to prevent osteopenia from developing into osteoporosis. What are the causes? Osteopenia is caused by loss of calcium in the bones. Bones are constantly changing. Old bone cells are continually being replaced with new bone cells. This process builds new bone. The mineral calcium is needed to build new bone and maintain bone density. Bone density is usually highest around age 19. After that, most people's bodies cannot replace all the bone they have lost with new bone. What increases the risk? You are more likely to develop this condition if: You are older than age 71. You are a woman who went through menopause early. You have a long illness that keeps you in bed. You do not get enough exercise. You lack certain nutrients (malnutrition). You have an overactive thyroid gland (hyperthyroidism). You use products that contain nicotine or tobacco, such as cigarettes, e-cigarettes and chewing tobacco, or you drink a lot of alcohol. You are taking medicines that weaken the bones, such as steroids. What are the signs or symptoms? This condition does not cause any symptoms. You may have a slightly higher risk for bone breaks (fractures), so getting fractures more easily than normal may be an indication of osteopenia. How is this diagnosed? This condition may be diagnosed based on an X-ray exam that measures bone density (dual-energy X-ray  absorptiometry, or DEXA). This test can measure bone density in your hips, spine, and wrists. Osteopenia has no symptoms, so this condition is usually diagnosed after a routine bone density screening test is done for osteoporosis. This routine screening is usually done for: Women who are age 2 or older. Men who are age 36 or older. If you have risk factors for osteopenia, you may have the screening test at an earlier age. How is this treated? Making dietary and lifestyle changes can lower your risk for osteoporosis. If you have severe osteopenia that is close to becoming osteoporosis, this condition can be treated with medicines and dietary supplements such as calcium and vitamin D. These supplements help to rebuild bone density. Follow these instructions at home: Eating and drinking Eat a diet that is high in calcium and vitamin D. Calcium is found in dairy products, beans, salmon, and leafy green vegetables like spinach and broccoli. Look for foods that have vitamin D and calcium added to them (fortified foods), such as orange juice, cereal, and bread.  Lifestyle Do 30 minutes or more of a weight-bearing exercise every day, such as walking, jogging, or playing a sport. These types of exercises strengthen the bones. Do not use any products that contain nicotine or tobacco, such as cigarettes, e-cigarettes, and chewing tobacco. If you need help quitting, ask your health care provider. Do not drink alcohol if: Your health care provider tells you not to drink. You are pregnant, may be pregnant, or are planning to become pregnant. If you drink alcohol: Limit how much you use to: 0-1 drink a day for women. 0-2  drinks a day for men. Be aware of how much alcohol is in your drink. In the [place] one drink equals one 12 oz bottle of beer (355 mL), one 5 oz glass of wine (148 mL), or one 1 oz glass of hard liquor (44 mL). General instructions Take over-the-counter and prescription medicines only as  told by your health care provider. These include vitamins and supplements. Take precautions at home to lower your risk of falling, such as: Keeping rooms well-lit and free of clutter, such as cords. Installing safety rails on stairs. Using rubber mats in the bathroom or other areas that are often wet or slippery. Keep all follow-up visits. This is important. Contact a health care provider if: You have not had a bone density screening for osteoporosis and you are: A woman who is age 36 or older. A man who is age 52 or older. You are a postmenopausal woman who has not had a bone density screening for osteoporosis. You are older than age 65 and you want to know if you should have bone density screening for osteoporosis. Summary Osteopenia is a loss of thickness (density) inside the bones. Another name for osteopenia is low bone mass. Osteopenia is not a disease, but it may increase your risk for a condition that causes the bones to become thin and break more easily (osteoporosis). You may be at risk for osteopenia if you are older than age 84 or if you are a woman who went through early menopause. Osteopenia does not cause any symptoms, but it can be diagnosed with a bone density screening test. Dietary and lifestyle changes are the first treatment for osteopenia. These may lower your risk for osteoporosis. This information is not intended to replace advice given to you by your health care provider. Make sure you discuss any questions you have with your health care provider. Document Revised: 06/22/2019 Document Reviewed: 06/22/2019 Elsevier Patient Education  [employer].

## 2022-03-17 ENCOUNTER — Encounter: Payer: Self-pay | Admitting: Nurse Practitioner

## 2022-03-17 ENCOUNTER — Ambulatory Visit (INDEPENDENT_AMBULATORY_CARE_PROVIDER_SITE_OTHER): Payer: Medicare Other | Admitting: Nurse Practitioner

## 2022-03-17 VITALS — BP 117/68 | HR 72 | Temp 98.0°F | Ht 64.49 in | Wt 228.2 lb

## 2022-03-17 DIAGNOSIS — E785 Hyperlipidemia, unspecified: Secondary | ICD-10-CM

## 2022-03-17 DIAGNOSIS — M85851 Other specified disorders of bone density and structure, right thigh: Secondary | ICD-10-CM

## 2022-03-17 DIAGNOSIS — G8929 Other chronic pain: Secondary | ICD-10-CM

## 2022-03-17 DIAGNOSIS — E559 Vitamin D deficiency, unspecified: Secondary | ICD-10-CM

## 2022-03-17 DIAGNOSIS — F99 Mental disorder, not otherwise specified: Secondary | ICD-10-CM

## 2022-03-17 DIAGNOSIS — E1159 Type 2 diabetes mellitus with other circulatory complications: Secondary | ICD-10-CM | POA: Diagnosis not present

## 2022-03-17 DIAGNOSIS — E271 Primary adrenocortical insufficiency: Secondary | ICD-10-CM | POA: Diagnosis not present

## 2022-03-17 DIAGNOSIS — F3341 Major depressive disorder, recurrent, in partial remission: Secondary | ICD-10-CM

## 2022-03-17 DIAGNOSIS — G6289 Other specified polyneuropathies: Secondary | ICD-10-CM

## 2022-03-17 DIAGNOSIS — E538 Deficiency of other specified B group vitamins: Secondary | ICD-10-CM

## 2022-03-17 DIAGNOSIS — M5442 Lumbago with sciatica, left side: Secondary | ICD-10-CM

## 2022-03-17 DIAGNOSIS — E1169 Type 2 diabetes mellitus with other specified complication: Secondary | ICD-10-CM

## 2022-03-17 DIAGNOSIS — Z85820 Personal history of malignant melanoma of skin: Secondary | ICD-10-CM

## 2022-03-17 DIAGNOSIS — M5441 Lumbago with sciatica, right side: Secondary | ICD-10-CM

## 2022-03-17 DIAGNOSIS — E669 Obesity, unspecified: Secondary | ICD-10-CM

## 2022-03-17 DIAGNOSIS — I152 Hypertension secondary to endocrine disorders: Secondary | ICD-10-CM

## 2022-03-17 DIAGNOSIS — Z9221 Personal history of antineoplastic chemotherapy: Secondary | ICD-10-CM

## 2022-03-17 DIAGNOSIS — F5105 Insomnia due to other mental disorder: Secondary | ICD-10-CM

## 2022-03-17 LAB — BAYER DCA HB A1C WAIVED: HB A1C (BAYER DCA - WAIVED): 9 % — ABNORMAL HIGH (ref 4.8–5.6)

## 2022-03-17 MED ORDER — HYDROCODONE-ACETAMINOPHEN 5-325 MG PO TABS: 1.0000 | ORAL_TABLET | Freq: Three times a day (TID) | ORAL | 0 refills | Status: AC | PRN

## 2022-03-17 MED ORDER — ONETOUCH VERIO W/DEVICE KIT: PACK | 0 refills | Status: DC

## 2022-03-17 MED ORDER — ONETOUCH VERIO VI STRP: ORAL_STRIP | 12 refills | Status: DC

## 2022-03-17 MED ORDER — METFORMIN HCL 500 MG PO TABS: ORAL_TABLET | ORAL | 2 refills | Status: DC

## 2022-03-17 MED ORDER — ONETOUCH ULTRASOFT LANCETS MISC: 12 refills | Status: DC

## 2022-03-17 NOTE — Assessment & Plan Note (Signed)
Followed by dermatology - continue this collaboration, as well as oncology collaboration.

## 2022-03-17 NOTE — Assessment & Plan Note (Signed)
Followed by oncology and dermatology, continue these collaborations.

## 2022-03-17 NOTE — Assessment & Plan Note (Signed)
Followed by neurology, continue current medication regimen provided by them.

## 2022-03-17 NOTE — Assessment & Plan Note (Signed)
History of B12 deficiency, check levels today and continue with supplement.

## 2022-03-17 NOTE — Progress Notes (Signed)
BP 117/68   Pulse 72   Temp 98 F (36.7 C) (Oral)   Ht 5' 4.49" (1.638 m)   Wt 228 lb 3.2 oz (103.5 kg)   SpO2 98%   BMI 38.58 kg/m    Subjective:    Patient ID: Sarah [name], female    DOB: 23-May-1950, 72 y.o.   MRN: [id number]  HPI: Sarah [name] is a 72 y.o. female  Chief Complaint  Patient presents with   Diabetes   Hypertension   Back Pain    For past few weeks, patient states that the pain is getting worse then when it started. Patient also reports being constipated   DIABETES Last A1c October 7.4%.  Not currently on medication.    [facility] every 6 months for history malignant melanoma in 2018 -- last visit on 04/09/21 and oncology 08/25/21 (she is to return to them as needed).  Followed by neurology for neuropathy after treatments  -- last seen 12/31/21, continues Pregabalin.   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   Hypoglycemic episodes:no Polydipsia/polyuria: no Visual disturbance: no Chest pain: no Paresthesias: yes Glucose Monitoring: no  Accucheck frequency: Not Checking  Fasting glucose:  Post prandial:  Evening:  Before meals: Taking Insulin?: no  Long acting insulin:  Short acting insulin: Blood Pressure Monitoring: a few times a month Retinal Examination:  eye exam this coming Friday 03/20/22 Foot Exam: Not up to Date Diabetic Education: Completed Pneumovax: Up to Date Influenza: Up to Date Aspirin: no   HYPERTENSION / HYPERLIPIDEMIA Continues on Losartan, Amlodipine, and Rosuvastatin + Lasix.  Satisfied with current treatment? yes Duration of hypertension: years BP monitoring frequency: a few times a day BP range:  BP medication side effects: no Duration of hyperlipidemia: years Cholesterol medication side effects: no Cholesterol supplements: none Medication compliance: good  compliance Aspirin: no Recent stressors: yes Recurrent headaches: yes-reports she had a headache on Saturday and took tylenol, has not had headaches since Visual changes: no Palpitations: no Dyspnea: no Chest pain: no Lower extremity edema: yes-has lymphedema in her right leg from melanoma surgery, wears compression stocking 23 hours a day Dizzy/lightheaded: no   DEPRESSION Continues medications as ordered by psychiatry with last visit on 02/17/22. Mood status: stable Satisfied with current treatment?: yes Symptom severity: moderate  Duration of current treatment : chronic Side effects: no Medication compliance: good compliance Psychotherapy/counseling: yes in the past Depressed mood: no Anxious mood: no Anhedonia: no Significant weight loss or gain: no Insomnia: yes hard to fall asleep Fatigue: yes Feelings of worthlessness or guilt: no Impaired concentration/indecisiveness: no Suicidal ideations: no Hopelessness: no Crying spells: no    02/04/2022    9:52 AM 11/10/2021    1:14 PM 07/15/2021   12:13 PM 07/11/2021    1:12 PM 05/30/2021    1:36 PM  Depression screen PHQ 2/9  Decreased Interest 0 1 0 1 1  Down, Depressed, Hopeless 0 1 0 1 0  PHQ - 2 Score 0 2 0 2 1  Altered sleeping '2 1  1 1  '$ Tired, decreased energy '1 1  1 1  '$ Change in appetite 1 1  0 2  Feeling bad or failure about yourself  0 0  0 0  Trouble concentrating 0 0  0 1  Moving slowly or fidgety/restless 0 0  0 0  Suicidal thoughts 0 0  0  0  PHQ-9 Score '4 5  4 6  '$ Difficult doing work/chores  Not difficult at all  Not difficult at all Not difficult at all       02/04/2022    9:55 AM 11/10/2021    1:14 PM 07/11/2021    1:12 PM 05/30/2021    1:36 PM  GAD 7 : Generalized Anxiety Score  Nervous, Anxious, on Edge 0 1 0 1  Control/stop worrying 0 0 0 0  Worry too much - different things 0 0 0 0  Trouble relaxing 1 0 0 1  Restless 1 0 0 0  Easily annoyed or irritable 0 0 0 0  Afraid - awful might happen 0 0  0 0  Total GAD 7 Score 2 1 0 2  Anxiety Difficulty  Not difficult at all Not difficult at all Not difficult at all   BACK PAIN Pt reports having low back pain and groin pain. Pt denies dysuria, frequency, urgency. Pt denies fever, chills. Pt does report that her urine has an odor to it. Pt states she was treated with antibiotics in January and this provided some relief of her symptoms but not complete relief. Pt also reports bending over to put a leash on her dog and reports having a sharp pain in her back at that time that has stayed constant. She takes APAP and uses heat with little relief. Pt reports the pain is not getting better or worse and is just staying the same.   Has underlying osteopenia and continues supplement for this. Duration: months Mechanism of injury:  pt states she bent over last week to put a leash on her dog and this made her back pain much worse and it has been worse ever since.  Location: low back Onset: sudden Severity: 7/10 Quality: pulling, stabbing, and throbbing Frequency: constant Radiation:  pt reports the pain radiates to her groin Aggravating factors: lifting, movement, and bending Alleviating factors: APAP Status: stable Treatments attempted: rest, heat, and APAP  Relief with NSAIDs?: mild Nighttime pain:  yes Paresthesias / decreased sensation:  no Bowel / bladder incontinence:  no Fevers:  no Dysuria / urinary frequency:  no   Relevant past medical, surgical, family and social history reviewed and updated as indicated. Interim medical history since our last visit reviewed. Allergies and medications reviewed and updated.  Review of Systems  Constitutional:  Negative for activity change, appetite change, fatigue and fever.  HENT:  Negative for congestion, rhinorrhea, sinus pressure and sore throat.   Respiratory:  Negative for chest tightness, shortness of breath and wheezing.   Cardiovascular:  Positive for leg swelling. Negative for chest pain  and palpitations.       Lymphedema from melanoma removal  Gastrointestinal:  Negative for diarrhea, nausea and vomiting.  Endocrine: Negative for polyuria.  Genitourinary:  Positive for flank pain. Negative for dyspareunia, dysuria, frequency, hematuria and urgency.  Musculoskeletal:  Positive for back pain.  Skin:  Negative for color change.  Neurological:  Positive for numbness and headaches. Negative for dizziness, syncope, speech difficulty, weakness and light-headedness.  Psychiatric/Behavioral:  Negative for agitation and sleep disturbance. The patient is not nervous/anxious.     Per HPI unless specifically indicated above     Objective:    BP 117/68   Pulse 72   Temp 98 F (36.7 C) (Oral)   Ht 5' 4.49" (1.638 m)   Wt 228 lb 3.2 oz (103.5 kg)   SpO2 98%   BMI 38.58 kg/m  Wt Readings from Last 3 Encounters:  03/17/22 228 lb 3.2 oz (103.5 kg)  02/04/22 221 lb 8 oz (100.5 kg)  11/12/21 220 lb (99.8 kg)    Physical Exam Vitals and nursing note reviewed.  Constitutional:      General: She is not in acute distress.    Appearance: Normal appearance. She is well-groomed. She is obese. She is not ill-appearing or toxic-appearing.  HENT:     Head: Normocephalic and atraumatic.     Right Ear: Hearing and external ear normal.     Left Ear: Hearing and external ear normal.  Eyes:     Conjunctiva/sclera: Conjunctivae normal.  Neck:     Thyroid: No thyromegaly.     Vascular: No carotid bruit.  Cardiovascular:     Rate and Rhythm: Normal rate and regular rhythm.     Heart sounds: Normal heart sounds. No murmur heard.    No gallop.  Pulmonary:     Effort: Pulmonary effort is normal. No accessory muscle usage or respiratory distress.     Breath sounds: Normal breath sounds. No stridor. No decreased breath sounds, wheezing or rhonchi.  Abdominal:     General: Bowel sounds are normal. There is no distension.     Palpations: Abdomen is soft.     Tenderness: There is no  abdominal tenderness.  Musculoskeletal:        General: Tenderness present.     Cervical back: Full passive range of motion without pain and neck supple.     Lumbar back: Tenderness present. No swelling, edema, signs of trauma or spasms. Decreased range of motion. Positive right straight leg raise test. Negative left straight leg raise test.     Right lower leg: Edema (trace) present.     Left lower leg: Edema (trace) present.  Lymphadenopathy:     Cervical: No cervical adenopathy.  Skin:    General: Skin is warm.  Neurological:     General: No focal deficit present.     Mental Status: She is alert and oriented to person, place, and time. Mental status is at baseline.     Deep Tendon Reflexes: Reflexes are normal and symmetric.     Reflex Scores:      Brachioradialis reflexes are 2+ on the right side and 2+ on the left side.      Patellar reflexes are 2+ on the right side and 2+ on the left side. Psychiatric:        Attention and Perception: Attention normal.        Mood and Affect: Mood normal.        Speech: Speech normal.        Behavior: Behavior normal.        Thought Content: Thought content normal.     Results for orders placed or performed in visit on 03/17/22  Bayer DCA Hb A1c Waived  Result Value Ref Range   HB A1C (BAYER DCA - WAIVED) 9.0 (H) 4.8 - 5.6 %      Assessment & Plan:   Problem List Items Addressed This Visit       Cardiovascular and Mediastinum   Hypertension associated with diabetes ([place])    Chronic, stable. BP at goal in office today. Recommend she monitor BP at least a few mornings a week at home and document.  DASH diet at home.  Continue current medication regimen and adjust as needed.  Labs today:  CMP, TSH, urine ALB.  Relevant Medications   metFORMIN (GLUCOPHAGE) 500 MG tablet   Other Relevant Orders   Bayer DCA Hb A1c Waived (Completed)   Comprehensive metabolic panel   TSH     Endocrine   Adrenal insufficiency (Addison's  disease) (HCC)    Chronic, ongoing. Followed by endocrinology, continue current regimen ordered by them. She is scheduled to follow-up with them this year and will alert them to recent increase in A1c level.      Hyperlipidemia associated with type 2 diabetes mellitus (HCC)    Chronic, ongoing. Continue current medications and adjust as needed. CMP and lipid check today.       Relevant Medications   metFORMIN (GLUCOPHAGE) 500 MG tablet   Other Relevant Orders   Bayer DCA Hb A1c Waived (Completed)   Comprehensive metabolic panel   Lipid Panel w/o Chol/HDL Ratio   Type 2 diabetes mellitus with obesity (HCC) - Primary    Chronic, ongoing. A1c trend up today to 9.0%, was 7.4% in October 2023. Collaboration with pt today and agreeable to start '500mg'$  Metformin QD with gradual increase to 1000 MG BID dosing and will begin checking fasting sugars at home. Rx for Metformin and glucometer sent. Educated her fully on this regimen and diabetes.  She is on long term steroids which could affect sugars.  Discussed diabetic friendly diet.  Plan for return after her cruise to see how tolerating Metformin and check sugars.      Relevant Medications   metFORMIN (GLUCOPHAGE) 500 MG tablet   Other Relevant Orders   Bayer DCA Hb A1c Waived (Completed)   Comprehensive metabolic panel   Microalbumin, Urine Waived     Nervous and Auditory   Axonal sensorimotor neuropathy    Followed by neurology, continue current medication regimen provided by them.        Musculoskeletal and Integument   Osteopenia of neck of right femur    Noted on DEXA 09/2021. Continue taking Vitamin D and discussed adequate calcium intake. DEXA to be repeated in 2028.  Check Vit D today.      Relevant Orders   VITAMIN D 25 Hydroxy (Vit-D Deficiency, Fractures)     Other   B12 deficiency    History of B12 deficiency, check levels today and continue with supplement.       Relevant Orders   Vitamin B12   Chronic lower back pain     Chronic, acute flare present today. Placing referral for Dr. Sharlet Salina and short burst of Norco for pain, would avoid steroids as is already on daily corticosteroids due to Addison's. Pt is aware not to take Norco at same time as Belsomra. Aware to reduce dose of tylenol while taking Norco. Obtain UA to ensure no underlying infection triggering worsening back pain.      Relevant Medications   HYDROcodone-acetaminophen (NORCO) 5-325 MG tablet   Other Relevant Orders   Urinalysis, Routine w reflex microscopic   Ambulatory referral to Pain Clinic   History of antineoplastic chemotherapy    Followed by oncology and dermatology, continue these collaborations.      History of malignant melanoma    Followed by dermatology - continue this collaboration, as well as oncology collaboration.      Insomnia due to other mental disorder    Chronic, ongoing.  Followed by psychiatry who fills Belsomra, continue current medication regimen as prescribed by them.      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HCC)    Chronic, stable.  Denies SI/HI.  Followed by psychiatry -- recent notes reviewed and will maintain current medication regimen as prescribed by them.      Vitamin D deficiency    Ongoing. Taking Vitamin D3, recheck level today.       Relevant Orders   VITAMIN D 25 Hydroxy (Vit-D Deficiency, Fractures)     Follow up plan: Return in about 6 weeks (around 04/27/2022) for T2DM -- Metformin started.

## 2022-03-17 NOTE — Assessment & Plan Note (Signed)
Chronic, ongoing.  Followed by psychiatry who fills Belsomra, continue current medication regimen as prescribed by them.

## 2022-03-17 NOTE — Assessment & Plan Note (Addendum)
Noted on DEXA 09/2021. Continue taking Vitamin D and discussed adequate calcium intake. DEXA to be repeated in 2028.  Check Vit D today.

## 2022-03-17 NOTE — Assessment & Plan Note (Addendum)
Chronic, ongoing. A1c trend up today to 9.0%, was 7.4% in October 2023. Collaboration with pt today and agreeable to start '500mg'$  Metformin QD with gradual increase to 1000 MG BID dosing and will begin checking fasting sugars at home. Rx for Metformin and glucometer sent. Educated her fully on this regimen and diabetes.  She is on long term steroids which could affect sugars.  Discussed diabetic friendly diet.  Plan for return after her cruise to see how tolerating Metformin and check sugars.

## 2022-03-17 NOTE — Assessment & Plan Note (Signed)
Ongoing. Taking Vitamin D3, recheck level today.

## 2022-03-17 NOTE — Assessment & Plan Note (Addendum)
Chronic, acute flare present today. Placing referral for Dr. Sharlet Salina and short burst of Norco for pain, would avoid steroids as is already on daily corticosteroids due to Addison's. Pt is aware not to take Norco at same time as Belsomra. Aware to reduce dose of tylenol while taking Norco. Obtain UA to ensure no underlying infection triggering worsening back pain.

## 2022-03-17 NOTE — Assessment & Plan Note (Signed)
Chronic, stable.  Denies SI/HI.  Followed by psychiatry -- recent notes reviewed and will maintain current medication regimen as prescribed by them.

## 2022-03-17 NOTE — Assessment & Plan Note (Addendum)
Chronic, stable. BP at goal in office today. Recommend she monitor BP at least a few mornings a week at home and document.  DASH diet at home.  Continue current medication regimen and adjust as needed.  Labs today:  CMP, TSH, urine ALB.

## 2022-03-17 NOTE — Assessment & Plan Note (Addendum)
Chronic, ongoing. Followed by endocrinology, continue current regimen ordered by them. She is scheduled to follow-up with them this year and will alert them to recent increase in A1c level.

## 2022-03-17 NOTE — Assessment & Plan Note (Signed)
Chronic, ongoing. Continue current medications and adjust as needed. CMP and lipid check today.

## 2022-03-18 LAB — COMPREHENSIVE METABOLIC PANEL
ALT: 23 IU/L (ref 0–32)
AST: 20 IU/L (ref 0–40)
Albumin/Globulin Ratio: 2 (ref 1.2–2.2)
Albumin: 4.5 g/dL (ref 3.9–4.9)
Alkaline Phosphatase: 105 IU/L (ref 44–121)
BUN/Creatinine Ratio: 14 (ref 12–28)
BUN: 16 mg/dL (ref 8–27)
Bilirubin Total: 0.5 mg/dL (ref 0.0–1.2)
CO2: 24 mmol/L (ref 20–29)
Calcium: 8.9 mg/dL (ref 8.7–10.3)
Chloride: 98 mmol/L (ref 96–106)
Creatinine, Ser: 1.12 mg/dL — ABNORMAL HIGH (ref 0.57–1.00)
Globulin, Total: 2.3 g/dL (ref 1.5–4.5)
Glucose: 272 mg/dL — ABNORMAL HIGH (ref 70–99)
Potassium: 3.9 mmol/L (ref 3.5–5.2)
Sodium: 138 mmol/L (ref 134–144)
Total Protein: 6.8 g/dL (ref 6.0–8.5)
eGFR: 53 mL/min/{1.73_m2} — ABNORMAL LOW (ref >59)

## 2022-03-18 LAB — LIPID PANEL W/O CHOL/HDL RATIO
Cholesterol, Total: 177 mg/dL (ref 100–199)
HDL: 48 mg/dL (ref >39)
LDL Chol Calc (NIH): 89 mg/dL (ref 0–99)
Triglycerides: 240 mg/dL — ABNORMAL HIGH (ref 0–149)
VLDL Cholesterol Cal: 40 mg/dL (ref 5–40)

## 2022-03-18 LAB — TSH: TSH: 1.73 u[IU]/mL (ref 0.450–4.500)

## 2022-03-18 LAB — VITAMIN D 25 HYDROXY (VIT D DEFICIENCY, FRACTURES): Vit D, 25-Hydroxy: 30 ng/mL (ref 30.0–100.0)

## 2022-03-18 LAB — VITAMIN B12: Vitamin B-12: 1810 pg/mL — ABNORMAL HIGH (ref 232–1245)

## 2022-03-18 NOTE — Progress Notes (Signed)
Contacted via MyChart   Good afternoon Sarah [name], your labs have returned: - Kidney function, creatinine and eGFR, continues to show stage 3a kidney disease with no worsening.  We will continue to monitor closely at visits. Glucose, sugar, had trended up which correlates with your elevation in A1c.  Liver function, AST and ALT, is normal. - Cholesterol levels are stable with exception of triglycerides having some ongoing elevation.  I may increase Rosuvastatin at next visit to 20 MG. - B12 well above goal, can reduce any B12 use to every other day dosing. - Remainder of labs are stable.  Any questions? Keep being amazing!!  Thank you for allowing me to participate in your care.  I appreciate you. Kindest regards, Falan Hensler

## 2022-03-20 ENCOUNTER — Other Ambulatory Visit: Payer: Self-pay | Admitting: Nurse Practitioner

## 2022-03-20 LAB — URINALYSIS, ROUTINE W REFLEX MICROSCOPIC
Bilirubin, UA: NEGATIVE
Glucose, UA: NEGATIVE
Ketones, UA: NEGATIVE
Nitrite, UA: POSITIVE — AB
Protein,UA: NEGATIVE
RBC, UA: NEGATIVE
Specific Gravity, UA: <1.005 — ABNORMAL LOW (ref 1.005–1.030)
Urobilinogen, Ur: 0.2 mg/dL (ref 0.2–1.0)
pH, UA: 5.5 (ref 5.0–7.5)

## 2022-03-20 LAB — MICROSCOPIC EXAMINATION: RBC, Urine: NONE SEEN /hpf (ref 0–2)

## 2022-03-20 LAB — MICROALBUMIN, URINE WAIVED
Creatinine, Urine Waived: 50 mg/dL (ref 10–300)
Microalb, Ur Waived: 10 mg/L (ref 0–19)

## 2022-03-20 MED ORDER — NITROFURANTOIN MONOHYD MACRO 100 MG PO CAPS: 100.0000 mg | ORAL_CAPSULE | Freq: Two times a day (BID) | ORAL | 0 refills | Status: AC

## 2022-03-20 NOTE — Addendum Note (Signed)
Addended by: Marnee Guarneri T on: 03/20/2022 05:16 PM   Modules accepted: Orders

## 2022-03-20 NOTE — Progress Notes (Signed)
Contacted via [employer] like urine infection may be present, I will send for culture and have sent in [facility] for you to start taking for treatment:)

## 2022-04-26 NOTE — Patient Instructions (Signed)
Diabetes Mellitus Basics  Diabetes mellitus, or diabetes, is a long-term (chronic) disease. It occurs when the body does not properly use sugar (glucose) that is released from food after you eat. Diabetes mellitus may be caused by one or both of these problems: Your pancreas does not make enough of a hormone called insulin. Your body does not react in a normal way to the insulin that it makes. Insulin lets glucose enter cells in your body. This gives you energy. If you have diabetes, glucose cannot get into cells. This causes high blood glucose (hyperglycemia). How to treat and manage diabetes You may need to take insulin or other diabetes medicines daily to keep your glucose in balance. If you are prescribed insulin, you will learn how to give yourself insulin by injection. You may need to adjust the amount of insulin you take based on the foods that you eat. You will need to check your blood glucose levels using a glucose monitor as told by your health care provider. The readings can help determine if you have low or high blood glucose. Generally, you should have these blood glucose levels: Before meals (preprandial): 80-130 mg/dL (4.4-7.2 mmol/L). After meals (postprandial): below 180 mg/dL (10 mmol/L). Hemoglobin A1c (HbA1c) level: less than 7%. Your health care provider will set treatment goals for you. Keep all follow-up visits. This is important. Follow these instructions at home: Diabetes medicines Take your diabetes medicines every day as told by your health care provider. List your diabetes medicines here: Name of medicine: ______________________________ Amount (dose): _______________ Time (a.m./p.m.): _______________ Notes: ___________________________________ Name of medicine: ______________________________ Amount (dose): _______________ Time (a.m./p.m.): _______________ Notes: ___________________________________ Name of medicine: ______________________________ Amount (dose):  _______________ Time (a.m./p.m.): _______________ Notes: ___________________________________ Insulin If you use insulin, list the types of insulin you use here: Insulin type: ______________________________ Amount (dose): _______________ Time (a.m./p.m.): _______________Notes: ___________________________________ Insulin type: ______________________________ Amount (dose): _______________ Time (a.m./p.m.): _______________ Notes: ___________________________________ Insulin type: ______________________________ Amount (dose): _______________ Time (a.m./p.m.): _______________ Notes: ___________________________________ Insulin type: ______________________________ Amount (dose): _______________ Time (a.m./p.m.): _______________ Notes: ___________________________________ Insulin type: ______________________________ Amount (dose): _______________ Time (a.m./p.m.): _______________ Notes: ___________________________________ Managing blood glucose  Check your blood glucose levels using a glucose monitor as told by your health care provider. Write down the times that you check your glucose levels here: Time: _______________ Notes: ___________________________________ Time: _______________ Notes: ___________________________________ Time: _______________ Notes: ___________________________________ Time: _______________ Notes: ___________________________________ Time: _______________ Notes: ___________________________________ Time: _______________ Notes: ___________________________________  Low blood glucose Low blood glucose (hypoglycemia) is when glucose is at or below 70 mg/dL (3.9 mmol/L). Symptoms may include: Feeling: Hungry. Sweaty and clammy. Irritable or easily upset. Dizzy. Sleepy. Having: A fast heartbeat. A headache. A change in your vision. Numbness around the mouth, lips, or tongue. Having trouble with: Moving (coordination). Sleeping. Treating low blood glucose To treat low blood  glucose, eat or drink something containing sugar right away. If you can think clearly and swallow safely, follow the 15:15 rule: Take 15 grams of a fast-acting carb (carbohydrate), as told by your health care provider. Some fast-acting carbs are: Glucose tablets: take 3-4 tablets. Hard candy: eat 3-5 pieces. Fruit juice: drink 4 oz (120 mL). Regular (not diet) soda: drink 4-6 oz (120-180 mL). Honey or sugar: eat 1 Tbsp (15 mL). Check your blood glucose levels 15 minutes after you take the carb. If your glucose is still at or below 70 mg/dL (3.9 mmol/L), take 15 grams of a carb again. If your glucose does not go above 70 mg/dL (3.9 mmol/L) after   3 tries, get help right away. After your glucose goes back to normal, eat a meal or a snack within 1 hour. Treating very low blood glucose If your glucose is at or below 54 mg/dL (3 mmol/L), you have very low blood glucose (severe hypoglycemia). This is an emergency. Do not wait to see if the symptoms will go away. Get medical help right away. Call your local emergency services (911 in the [place] Do not drive yourself to the hospital. Questions to ask your health care provider Should I talk with a diabetes educator? What equipment will I need to care for myself at home? What diabetes medicines do I need? When should I take them? How often do I need to check my blood glucose levels? What number can I call if I have questions? When is my follow-up visit? Where can I find a support group for people with diabetes? Where to find more information American Diabetes Association: www.diabetes.org Association of Diabetes Care and Education Specialists: www.diabeteseducator.org Contact a health care provider if: Your blood glucose is at or above 240 mg/dL (13.3 mmol/L) for 2 days in a row. You have been sick or have had a fever for 2 days or more, and you are not getting better. You have any of these problems for more than 6 hours: You cannot eat or  drink. You feel nauseous. You vomit. You have diarrhea. Get help right away if: Your blood glucose is lower than 54 mg/dL (3 mmol/L). You get confused. You have trouble thinking clearly. You have trouble breathing. These symptoms may represent a serious problem that is an emergency. Do not wait to see if the symptoms will go away. Get medical help right away. Call your local emergency services (911 in the [place] Do not drive yourself to the hospital. Summary Diabetes mellitus is a chronic disease that occurs when the body does not properly use sugar (glucose) that is released from food after you eat. Take insulin and diabetes medicines as told. Check your blood glucose every day, as often as told. Keep all follow-up visits. This is important. This information is not intended to replace advice given to you by your health care provider. Make sure you discuss any questions you have with your health care provider. Document Revised: 05/09/2019 Document Reviewed: 05/09/2019 Elsevier Patient Education  2023 [employer]  

## 2022-04-28 ENCOUNTER — Encounter: Payer: Self-pay | Admitting: Nurse Practitioner

## 2022-04-28 ENCOUNTER — Ambulatory Visit (INDEPENDENT_AMBULATORY_CARE_PROVIDER_SITE_OTHER): Payer: Medicare Other | Admitting: Nurse Practitioner

## 2022-04-28 VITALS — BP 118/78 | HR 80 | Temp 98.0°F | Ht 64.49 in | Wt 216.4 lb

## 2022-04-28 DIAGNOSIS — E1169 Type 2 diabetes mellitus with other specified complication: Secondary | ICD-10-CM

## 2022-04-28 DIAGNOSIS — E669 Obesity, unspecified: Secondary | ICD-10-CM

## 2022-04-28 DIAGNOSIS — J0110 Acute frontal sinusitis, unspecified: Secondary | ICD-10-CM | POA: Insufficient documentation

## 2022-04-28 MED ORDER — CEFPODOXIME PROXETIL 100 MG PO TABS: 100.0000 mg | ORAL_TABLET | Freq: Two times a day (BID) | ORAL | 0 refills | Status: AC

## 2022-04-28 MED ORDER — MECLIZINE HCL 25 MG PO TABS: 25.0000 mg | ORAL_TABLET | Freq: Three times a day (TID) | ORAL | 2 refills | Status: DC | PRN

## 2022-04-28 NOTE — Assessment & Plan Note (Signed)
Acute with irritation noted to right ear as well, suspect from going on recent cruise.  She is allergic to Penicillin (rash), Doxycycline (rash), and Cipro (rash) -- can not take Zpack due to risk for QT prolongation. Discussed with patient, she has not tried Cefpodoxime before.  We will try this and instructed her if any rash or other side effects present immediately contact provider and stop taking.  Avoid Prednisone at this time due to elevated sugars.  Meclizine refills sent in.  Recommend: - Increased rest - Increasing Fluids - Acetaminophen as needed for fever/pain.  - Salt water gargling, chloraseptic spray and throat lozenges - Mucinex.  - Humidifying the air.  Sees ENT upcoming and she reports she will have them recheck her ear, as they clean them every 6 months.

## 2022-04-28 NOTE — Progress Notes (Signed)
BP 118/78   Pulse 80   Temp 98 F (36.7 C) (Oral)   Ht 5' 4.49" (1.638 m)   Wt 216 lb 6.4 oz (98.2 kg)   SpO2 98%   BMI 36.58 kg/m    Subjective:    Patient ID: Sarah [name], female    DOB: 03/31/1950, 72 y.o.   MRN: [id number]  HPI: Sharise Burgueno is a 72 y.o. female  Chief Complaint  Patient presents with   Diabetes    Started Metformin at last visit, patient states she is doing good with new medication   Sore Throat    Ear pain, and vertigo since 3/21 after her cruise   DIABETES A1c in February 9% and started Metformin -- taking 1000 MG BID.  Has lost 12 pounds since starting.   Hypoglycemic episodes:no Polydipsia/polyuria: no Visual disturbance: no Chest pain: no Paresthesias: no Glucose Monitoring: yes  Accucheck frequency: Daily  Fasting glucose: 113 to 209 -- meter recently broke and is waiting on new one  Post prandial:  Evening:  Before meals: Taking Insulin?: no  Long acting insulin:  Short acting insulin: Blood Pressure Monitoring: daily Retinal Examination: Not up to Date Foot Exam: Up to Date Pneumovax: Up to Date Influenza: Up to Date Aspirin: no   UPPER RESPIRATORY TRACT INFECTION Started on 04/11/22 when went on cruise.  Had ear pain to right side and vertigo.  Goes to ENT at end of this month for every 6 month cleaning.   Fever: no Cough: yes Shortness of breath: no Wheezing: no Chest pain: no Chest tightness: no Chest congestion: yes Nasal congestion: yes Runny nose: yes Post nasal drip: yes Sneezing: no Sore throat: yes -- irritated at night Swollen glands: no Sinus pressure: no Headache: yes Face pain: no Toothache: no Ear pain: yes to right side Ear pressure: yes "right Eyes red/itching:no Eye drainage/crusting: no  Vomiting: no Rash: no Fatigue: yes Sick contacts: no Strep contacts: no  Context: stable Recurrent sinusitis: no Relief with OTC cold/cough medications: no  Treatments attempted: Sudafed, Coricidin,  Tylenol  Relevant past medical, surgical, family and social history reviewed and updated as indicated. Interim medical history since our last visit reviewed. Allergies and medications reviewed and updated.  Review of Systems  Constitutional:  Positive for fatigue. Negative for activity change, appetite change, diaphoresis and fever.  HENT:  Positive for congestion, ear pain, postnasal drip, rhinorrhea, sinus pressure and sore throat. Negative for ear discharge, hearing loss, sinus pain, sneezing, tinnitus and voice change.   Respiratory:  Positive for cough. Negative for chest tightness, shortness of breath and wheezing.   Cardiovascular:  Negative for chest pain, palpitations and leg swelling.  Gastrointestinal: Negative.   Endocrine: Negative for polydipsia, polyphagia and polyuria.  Neurological:  Positive for headaches. Negative for dizziness, syncope, weakness, light-headedness and numbness.  Psychiatric/Behavioral: Negative.      Per HPI unless specifically indicated above     Objective:    BP 118/78   Pulse 80   Temp 98 F (36.7 C) (Oral)   Ht 5' 4.49" (1.638 m)   Wt 216 lb 6.4 oz (98.2 kg)   SpO2 98%   BMI 36.58 kg/m   Wt Readings from Last 3 Encounters:  04/28/22 216 lb 6.4 oz (98.2 kg)  03/17/22 228 lb 3.2 oz (103.5 kg)  02/04/22 221 lb 8 oz (100.5 kg)    Physical Exam Vitals and nursing note reviewed.  Constitutional:      General: She is awake. She is  not in acute distress.    Appearance: She is well-developed and well-groomed. She is obese. She is not ill-appearing or toxic-appearing.  HENT:     Head: Normocephalic.     Right Ear: Hearing, ear canal and external ear normal. No drainage. A middle ear effusion is present. There is no impacted cerumen. Tympanic membrane is injected. Tympanic membrane is not perforated.     Left Ear: Hearing, ear canal and external ear normal. No drainage.  No middle ear effusion. There is no impacted cerumen. Tympanic membrane is  not injected or perforated.     Nose: Rhinorrhea present. Rhinorrhea is clear.     Right Sinus: Frontal sinus tenderness present. No maxillary sinus tenderness.     Left Sinus: Frontal sinus tenderness present. No maxillary sinus tenderness.     Mouth/Throat:     Mouth: Mucous membranes are moist.     Pharynx: Posterior oropharyngeal erythema (mild) present. No pharyngeal swelling or oropharyngeal exudate.  Eyes:     General: Lids are normal.        Right eye: No discharge.        Left eye: No discharge.     Conjunctiva/sclera: Conjunctivae normal.     Pupils: Pupils are equal, round, and reactive to light.  Neck:     Thyroid: No thyromegaly.     Vascular: No carotid bruit.  Cardiovascular:     Rate and Rhythm: Normal rate and regular rhythm.     Heart sounds: Normal heart sounds. No murmur heard.    No gallop.  Pulmonary:     Effort: Pulmonary effort is normal. No accessory muscle usage or respiratory distress.     Breath sounds: Normal breath sounds.  Abdominal:     General: Bowel sounds are normal.     Palpations: Abdomen is soft. There is no hepatomegaly or splenomegaly.  Musculoskeletal:     Cervical back: Normal range of motion and neck supple.     Right lower leg: No edema.     Left lower leg: No edema.  Skin:    General: Skin is warm and dry.  Neurological:     Mental Status: She is alert and oriented to person, place, and time.     Deep Tendon Reflexes: Reflexes are normal and symmetric.     Reflex Scores:      Brachioradialis reflexes are 2+ on the right side and 2+ on the left side.      Patellar reflexes are 2+ on the right side and 2+ on the left side. Psychiatric:        Attention and Perception: Attention normal.        Mood and Affect: Mood normal.        Speech: Speech normal.        Behavior: Behavior normal. Behavior is cooperative.        Thought Content: Thought content normal.    Diabetic Foot Exam - Simple   Simple Foot Form Visual Inspection No  deformities, no ulcerations, no other skin breakdown bilaterally: Yes Sensation Testing Intact to touch and monofilament testing bilaterally: Yes Pulse Check Posterior Tibialis and Dorsalis pulse intact bilaterally: Yes Comments    Results for orders placed or performed in visit on 03/17/22  Microscopic Examination   BLD  Result Value Ref Range   WBC, UA 0-5 0 - 5 /hpf   RBC, Urine None seen 0 - 2 /hpf   Epithelial Cells (non renal) 0-10 0 - 10 /hpf   Bacteria,  UA Few None seen/Few  Bayer DCA Hb A1c Waived  Result Value Ref Range   HB A1C (BAYER DCA - WAIVED) 9.0 (H) 4.8 - 5.6 %  Comprehensive metabolic panel  Result Value Ref Range   Glucose 272 (H) 70 - 99 mg/dL   BUN 16 8 - 27 mg/dL   Creatinine, Ser 4.09 (H) 0.57 - 1.00 mg/dL   eGFR 53 (L) >81 XB/JYN/8.29   BUN/Creatinine Ratio 14 12 - 28   Sodium 138 134 - 144 mmol/L   Potassium 3.9 3.5 - 5.2 mmol/L   Chloride 98 96 - 106 mmol/L   CO2 24 20 - 29 mmol/L   Calcium 8.9 8.7 - 10.3 mg/dL   Total Protein 6.8 6.0 - 8.5 g/dL   Albumin 4.5 3.9 - 4.9 g/dL   Globulin, Total 2.3 1.5 - 4.5 g/dL   Albumin/Globulin Ratio 2.0 1.2 - 2.2   Bilirubin Total 0.5 0.0 - 1.2 mg/dL   Alkaline Phosphatase 105 44 - 121 IU/L   AST 20 0 - 40 IU/L   ALT 23 0 - 32 IU/L  Lipid Panel w/o Chol/HDL Ratio  Result Value Ref Range   Cholesterol, Total 177 100 - 199 mg/dL   Triglycerides 562 (H) 0 - 149 mg/dL   HDL 48 >13 mg/dL   VLDL Cholesterol Cal 40 5 - 40 mg/dL   LDL Chol Calc (NIH) 89 0 - 99 mg/dL  TSH  Result Value Ref Range   TSH 1.730 0.450 - 4.500 uIU/mL  VITAMIN D 25 Hydroxy (Vit-D Deficiency, Fractures)  Result Value Ref Range   Vit D, 25-Hydroxy 30.0 30.0 - 100.0 ng/mL  Vitamin B12  Result Value Ref Range   Vitamin B-12 1,810 (H) 232 - 1,245 pg/mL  Microalbumin, Urine Waived  Result Value Ref Range   Microalb, Ur Waived 10 0 - 19 mg/L   Creatinine, Urine Waived 50 10 - 300 mg/dL   Microalb/Creat Ratio 30-300 (H) <30 mg/g   Urinalysis, Routine w reflex microscopic  Result Value Ref Range   Specific Gravity, UA <1.005 (L) 1.005 - 1.030   pH, UA 5.5 5.0 - 7.5   Color, UA Yellow Yellow   Appearance Ur Clear Clear   Leukocytes,UA Trace (A) Negative   Protein,UA Negative Negative/Trace   Glucose, UA Negative Negative   Ketones, UA Negative Negative   RBC, UA Negative Negative   Bilirubin, UA Negative Negative   Urobilinogen, Ur 0.2 0.2 - 1.0 mg/dL   Nitrite, UA Positive (A) Negative   Microscopic Examination See below:       Assessment & Plan:   Problem List Items Addressed This Visit       Respiratory   Acute frontal sinusitis    Acute with irritation noted to right ear as well, suspect from going on recent cruise.  She is allergic to Penicillin (rash), Doxycycline (rash), and Cipro (rash) -- can not take Zpack due to risk for QT prolongation. Discussed with patient, she has not tried Cefpodoxime before.  We will try this and instructed her if any rash or other side effects present immediately contact provider and stop taking.  Avoid Prednisone at this time due to elevated sugars.  Meclizine refills sent in.  Recommend: - Increased rest - Increasing Fluids - Acetaminophen as needed for fever/pain.  - Salt water gargling, chloraseptic spray and throat lozenges - Mucinex.  - Humidifying the air.  Sees ENT upcoming and she reports she will have them recheck her ear, as they clean  them every 6 months.      Relevant Medications   cefpodoxime (VANTIN) 100 MG tablet     Endocrine   Type 2 diabetes mellitus with obesity - Primary    Chronic, ongoing with A1c in February 9%, urine ALB 29 March 2022.  Started on Metformin and is tolerating this at 1000 MG BID.  Continue this dosing at this time.  Recommend check BS fasting every morning and document for visits.  Will plan on recheck of A1c at end of May.  Recommend heavy focus on diabetic diet at home and regular exercise. - Recommend she obtain eye exam -  Foot exam up to date - ARB and Statin on board - Pneumococcal and Flu vaccines up to date.        Follow up plan: Return in about 7 weeks (around 06/16/2022) for T2DM, HTN/HLD, MOOD.

## 2022-04-28 NOTE — Assessment & Plan Note (Addendum)
Chronic, ongoing with A1c in February 9%, urine ALB 29 March 2022.  Started on Metformin and is tolerating this at 1000 MG BID.  Continue this dosing at this time.  Recommend check BS fasting every morning and document for visits.  Will plan on recheck of A1c at end of May.  Recommend heavy focus on diabetic diet at home and regular exercise. - Recommend she obtain eye exam - Foot exam up to date - ARB and Statin on board - Pneumococcal and Flu vaccines up to date.

## 2022-05-26 ENCOUNTER — Encounter: Payer: Self-pay | Admitting: Behavioral Health

## 2022-05-26 ENCOUNTER — Ambulatory Visit (INDEPENDENT_AMBULATORY_CARE_PROVIDER_SITE_OTHER): Payer: Medicare Other | Admitting: Behavioral Health

## 2022-05-26 DIAGNOSIS — F5105 Insomnia due to other mental disorder: Secondary | ICD-10-CM | POA: Diagnosis not present

## 2022-05-26 DIAGNOSIS — F411 Generalized anxiety disorder: Secondary | ICD-10-CM

## 2022-05-26 DIAGNOSIS — F99 Mental disorder, not otherwise specified: Secondary | ICD-10-CM | POA: Diagnosis not present

## 2022-05-26 DIAGNOSIS — F3341 Major depressive disorder, recurrent, in partial remission: Secondary | ICD-10-CM | POA: Diagnosis not present

## 2022-05-26 MED ORDER — ESCITALOPRAM OXALATE 10 MG PO TABS: 10.0000 mg | ORAL_TABLET | Freq: Every day | ORAL | 1 refills | Status: DC

## 2022-05-26 MED ORDER — AMITRIPTYLINE HCL 50 MG PO TABS: 50.0000 mg | ORAL_TABLET | Freq: Every day | ORAL | 1 refills | Status: DC

## 2022-05-26 MED ORDER — BELSOMRA 20 MG PO TABS: ORAL_TABLET | ORAL | 3 refills | Status: DC

## 2022-05-26 NOTE — Progress Notes (Signed)
[facility]  Patient ID: Sarah [name],  MRN: [id number]  PCP: Marjie Skiff, NP  Date of Evaluation: 05/26/2022 Time spent:30 minutes  Chief Complaint:  Chief Complaint   Anxiety; Depression; Follow-up; Patient Education; Medication Refill     HISTORY/CURRENT STATUS: HPI  72 year old female presents to this office for follow up and medication management.  No changes this visit. She says that she continues to feel stable with Lexapro and does not need any changes to her medications at this time.  She is now living in a retirement community. She reports her anxiety at 1/10 and depression 1/10. She is sleeping well at 7-8 hours at night with aid of Ambien but would like to try a medication that is deemed safer for 65 and older. Denies mania, no psychosis. No SI/HI.    No previous medication failures noted this visit.  Individual Medical History/ Review of Systems: Changes? :No   Allergies: Doxycycline, Ciprofloxacin, and Penicillins  Current Medications:  Current Outpatient Medications:    amitriptyline (ELAVIL) 50 MG tablet, Take 1 tablet (50 mg total) by mouth at bedtime., Disp: 90 tablet, Rfl: 1   amLODipine (NORVASC) 2.5 MG tablet, Take 1 tablet (2.5 mg total) by mouth daily., Disp: 90 tablet, Rfl: 4   Baclofen 5 MG TABS, Take 1 tablet (5 mg total) by mouth every 8 (eight) hours as needed., Disp: 45 tablet, Rfl: 2   Blood Glucose Monitoring Suppl (ONETOUCH VERIO) w/Device KIT, Use to check blood sugar 3 times a day and document results, bring to appointments.  Goal is <130 fasting blood sugar and <180 two hours after meals., Disp: 1 kit, Rfl: 0   Cholecalciferol (VITAMIN D) 50 MCG (2000 UT) CAPS, Take by mouth., Disp: , Rfl:    Cyanocobalamin (VITAMIN B-12 PO), Take by mouth daily., Disp: , Rfl:    escitalopram (LEXAPRO) 10 MG tablet, Take 1 tablet (10 mg total) by mouth daily., Disp: 90 tablet, Rfl: 1   furosemide (LASIX) 40 MG tablet, Take 1 tablet (40 mg total)  by mouth daily., Disp: 90 tablet, Rfl: 4   glucose blood (ONETOUCH VERIO) test strip, Use as instructed, Disp: 100 each, Rfl: 12   hydrocortisone (CORTEF) 10 MG tablet, 10 mg daily. 15 mg in the am and 5 mg in the pm, Disp: , Rfl: 3   Lancets (ONETOUCH ULTRASOFT) lancets, Use to check blood sugar 3 times a day and document results, bring to appointments.  Goal is <130 fasting blood sugar and <180 two hours after meals., Disp: 100 each, Rfl: 12   losartan (COZAAR) 50 MG tablet, Take 1 tablet (50 mg total) by mouth daily., Disp: 90 tablet, Rfl: 4   meclizine (ANTIVERT) 25 MG tablet, Take 1 tablet (25 mg total) by mouth 3 (three) times daily as needed for dizziness., Disp: 30 tablet, Rfl: 2   metFORMIN (GLUCOPHAGE) 500 MG tablet, Start out taking one tablet (500 MG) by mouth twice a day, if in one week tolerating increase to 1000 MG (2 tablets) in morning and 500 MG (1 tablet) in evening by mouth, then if tolerating this after one week increase to 1000 MG (2 tablets) twice a day by mouth., Disp: 360 tablet, Rfl: 2   pregabalin (LYRICA) 150 MG capsule, 150 mg in am and 300 mg in pm, Disp: , Rfl:    rosuvastatin (CRESTOR) 10 MG tablet, Take 1 tablet (10 mg total) by mouth daily., Disp: 90 tablet, Rfl: 4   Suvorexant (BELSOMRA) 20 MG  TABS, Take one tablet by mouth at bedtime, Disp: 30 tablet, Rfl: 3 Medication Side Effects: none  Family Medical/ Social History: Changes? No  MENTAL HEALTH EXAM:  There were no vitals taken for this visit.There is no height or weight on file to calculate BMI.  General Appearance: Casual, Neat, and Well Groomed  Eye Contact:  Good  Speech:  Clear and Coherent  Volume:  Normal  Mood:  Anxious, Depressed, and Dysphoric  Affect:  Appropriate  Thought Process:  Coherent  Orientation:  Full (Time, Place, and Person)  Thought Content: Logical   Suicidal Thoughts:  No  Homicidal Thoughts:  No  Memory:  WNL  Judgement:  Good  Insight:  Good  Psychomotor Activity:   Normal  Concentration:  Concentration: Good  Recall:  Good  Fund of Knowledge: Good  Language: Good  Assets:  Desire for Improvement  ADL's:  Intact  Cognition: WNL  Prognosis:  Good    DIAGNOSES: No diagnosis found.  Receiving Psychotherapy: No    RECOMMENDATIONS:   Greater than 50% of 30 min  face to face time with patient was spent on counseling and coordination of care. No changes this visit.  We discussed her current stability with her current medications. She is reporting very low levels of anxiety and depression with Lexapro. No changes are indicated at this time. Reinforced documented previous and current flagged warning of  QT prolongation and risk with Lexapro. She has been on the medication as reported by Pt since 2018.    To continue Lexapro 10 mg daily To continue Belsomra 20 mg at bedtime To continue Amitriptyline 50 mg at bedtime Will report any worsening symptoms promptly To follow up in 4 months to reassess per pt request Provided emergency contact information Reviewed PDMP   Joan Flores, NP

## 2022-06-05 ENCOUNTER — Other Ambulatory Visit: Payer: Self-pay | Admitting: Nurse Practitioner

## 2022-06-05 NOTE — Telephone Encounter (Signed)
Requested Prescriptions  Pending Prescriptions Disp Refills   Baclofen 5 MG TABS [Pharmacy Med Name: BACLOFEN 5 MG TABLET] 45 tablet 2    Sig: TAKE 1 TABLET BY MOUTH EVERY 8 HOURS AS NEEDED.     Analgesics:  Muscle Relaxants - baclofen Failed - 06/05/2022  4:37 PM      Failed - Cr in normal range and within 180 days    Creat  Date Value Ref Range Status  04/23/2020 0.89 0.50 - 0.99 mg/dL Final    Comment:    For patients >71 years of age, the reference limit for Creatinine is approximately 13% higher for people identified as African-American. .    Creatinine, Ser  Date Value Ref Range Status  03/17/2022 1.12 (H) 0.57 - 1.00 mg/dL Final   Creatinine, Urine  Date Value Ref Range Status  01/10/2018 14 (L) 20 - 275 mg/dL Final         Passed - eGFR is 30 or above and within 180 days    GFR, Est African American  Date Value Ref Range Status  04/23/2020 77 > OR = 60 mL/min/1.37m2 Final   GFR, Est Non African American  Date Value Ref Range Status  04/23/2020 67 > OR = 60 mL/min/1.89m2 Final   GFR, Estimated  Date Value Ref Range Status  11/12/2021 59 (L) >60 mL/min Final    Comment:    (NOTE) Calculated using the CKD-EPI Creatinine Equation (2021)    eGFR  Date Value Ref Range Status  03/17/2022 53 (L) >59 mL/min/1.73 Final         Passed - Valid encounter within last 6 months    Recent Outpatient Visits           1 month ago Type 2 diabetes mellitus with obesity (HCC)   [place] [facility] [place], [place] T, NP   2 months ago Type 2 diabetes mellitus with obesity (HCC)   [place] [facility] [place], [place] T, NP   4 months ago Generalized abdominal pain   [place] [facility] [place], [place] T, NP   6 months ago Type 2 diabetes mellitus with obesity (HCC)   [place] [facility] [place], [place] T, NP   7 months ago Urinary symptom or sign   [place] [facility] [place], Dorie Rank, NP        Future Appointments             In 1 week [place], Dorie Rank, NP [place] [facility]

## 2022-06-15 NOTE — Patient Instructions (Signed)
Be Involved in Your Health Care:  Taking Medications When medications are taken as directed, they can greatly improve your health. But if they are not taken as instructed, they may not work. In some cases, not taking them correctly can be harmful. To help ensure your treatment remains effective and safe, understand your medications and how to take them.  Your lab results, notes and after visit summary will be available on My Chart. We strongly encourage you to use this feature. If lab results are abnormal the clinic will contact you with the appropriate steps. If the clinic does not contact you assume the results are satisfactory. You can always see your results on My Chart. If you have questions regarding your condition, please contact the clinic during office hours. You can also ask questions on My Chart.  We at [facility] are grateful that you chose [place] to provide care. We strive to provide excellent and compassionate care and are always looking for feedback. If you get a survey from the clinic please complete this.   Diabetes Mellitus and Nutrition, Adult When you have diabetes, or diabetes mellitus, it is very important to have healthy eating habits because your blood sugar (glucose) levels are greatly affected by what you eat and drink. Eating healthy foods in the right amounts, at about the same times every day, can help you: Manage your blood glucose. Lower your risk of heart disease. Improve your blood pressure. Reach or maintain a healthy weight. What can affect my meal plan? Every person with diabetes is different, and each person has different needs for a meal plan. Your health care provider may recommend that you work with a dietitian to make a meal plan that is best for you. Your meal plan may vary depending on factors such as: The calories you need. The medicines you take. Your weight. Your blood glucose, blood pressure, and cholesterol levels. Your activity  level. Other health conditions you have, such as heart or kidney disease. How do carbohydrates affect me? Carbohydrates, also called carbs, affect your blood glucose level more than any other type of food. Eating carbs raises the amount of glucose in your blood. It is important to know how many carbs you can safely have in each meal. This is different for every person. Your dietitian can help you calculate how many carbs you should have at each meal and for each snack. How does alcohol affect me? Alcohol can cause a decrease in blood glucose (hypoglycemia), especially if you use insulin or take certain diabetes medicines by mouth. Hypoglycemia can be a life-threatening condition. Symptoms of hypoglycemia, such as sleepiness, dizziness, and confusion, are similar to symptoms of having too much alcohol. Do not drink alcohol if: Your health care provider tells you not to drink. You are pregnant, may be pregnant, or are planning to become pregnant. If you drink alcohol: Limit how much you have to: 0-1 drink a day for women. 0-2 drinks a day for men. Know how much alcohol is in your drink. In the [place] one drink equals one 12 oz bottle of beer (355 mL), one 5 oz glass of wine (148 mL), or one 1 oz glass of hard liquor (44 mL). Keep yourself hydrated with water, diet soda, or unsweetened iced tea. Keep in mind that regular soda, juice, and other mixers may contain a lot of sugar and must be counted as carbs. What are tips for following this plan?  Reading food labels Start by checking the serving  size on the Nutrition Facts label of packaged foods and drinks. The number of calories and the amount of carbs, fats, and other nutrients listed on the label are based on one serving of the item. Many items contain more than one serving per package. Check the total grams (g) of carbs in one serving. Check the number of grams of saturated fats and trans fats in one serving. Choose foods that have a low amount  or none of these fats. Check the number of milligrams (mg) of salt (sodium) in one serving. Most people should limit total sodium intake to less than 2,300 mg per day. Always check the nutrition information of foods labeled as "low-fat" or "nonfat." These foods may be higher in added sugar or refined carbs and should be avoided. Talk to your dietitian to identify your daily goals for nutrients listed on the label. Shopping Avoid buying canned, pre-made, or processed foods. These foods tend to be high in fat, sodium, and added sugar. Shop around the outside edge of the grocery store. This is where you will most often find fresh fruits and vegetables, bulk grains, fresh meats, and fresh dairy products. Cooking Use low-heat cooking methods, such as baking, instead of high-heat cooking methods, such as deep frying. Cook using healthy oils, such as olive, canola, or sunflower oil. Avoid cooking with butter, cream, or high-fat meats. Meal planning Eat meals and snacks regularly, preferably at the same times every day. Avoid going long periods of time without eating. Eat foods that are high in fiber, such as fresh fruits, vegetables, beans, and whole grains. Eat 4-6 oz (112-168 g) of lean protein each day, such as lean meat, chicken, fish, eggs, or tofu. One ounce (oz) (28 g) of lean protein is equal to: 1 oz (28 g) of meat, chicken, or fish. 1 egg.  cup (62 g) of tofu. Eat some foods each day that contain healthy fats, such as avocado, nuts, seeds, and fish. What foods should I eat? Fruits Berries. Apples. Oranges. Peaches. Apricots. Plums. Grapes. Mangoes. Papayas. Pomegranates. Kiwi. Cherries. Vegetables Leafy greens, including lettuce, spinach, kale, chard, collard greens, mustard greens, and cabbage. Beets. Cauliflower. Broccoli. Carrots. Green beans. Tomatoes. Peppers. Onions. Cucumbers. Brussels sprouts. Grains Whole grains, such as whole-wheat or whole-grain bread, crackers, tortillas,  cereal, and pasta. Unsweetened oatmeal. Quinoa. Brown or wild rice. Meats and other proteins Seafood. Poultry without skin. Lean cuts of poultry and beef. Tofu. Nuts. Seeds. Dairy Low-fat or fat-free dairy products such as milk, yogurt, and cheese. The items listed above may not be a complete list of foods and beverages you can eat and drink. Contact a dietitian for more information. What foods should I avoid? Fruits Fruits canned with syrup. Vegetables Canned vegetables. Frozen vegetables with butter or cream sauce. Grains Refined white flour and flour products such as bread, pasta, snack foods, and cereals. Avoid all processed foods. Meats and other proteins Fatty cuts of meat. Poultry with skin. Breaded or fried meats. Processed meat. Avoid saturated fats. Dairy Full-fat yogurt, cheese, or milk. Beverages Sweetened drinks, such as soda or iced tea. The items listed above may not be a complete list of foods and beverages you should avoid. Contact a dietitian for more information. Questions to ask a health care provider Do I need to meet with a certified diabetes care and education specialist? Do I need to meet with a dietitian? What number can I call if I have questions? When are the best times to check my blood glucose?  Where to find more information: American Diabetes Association: diabetes.org Academy of Nutrition and Dietetics: eatright.[employer] of Diabetes and Digestive and Kidney Diseases: [email or web address].si Association of Diabetes Care & Education Specialists: diabeteseducator.org Summary It is important to have healthy eating habits because your blood sugar (glucose) levels are greatly affected by what you eat and drink. It is important to use alcohol carefully. A healthy meal plan will help you manage your blood glucose and lower your risk of heart disease. Your health care provider may recommend that you work with a dietitian to make a meal plan that is best for  you. This information is not intended to replace advice given to you by your health care provider. Make sure you discuss any questions you have with your health care provider. Document Revised: 08/09/2019 Document Reviewed: 08/09/2019 Elsevier Patient Education  2024 [employer].

## 2022-06-17 ENCOUNTER — Encounter: Payer: Self-pay | Admitting: Nurse Practitioner

## 2022-06-17 ENCOUNTER — Ambulatory Visit (INDEPENDENT_AMBULATORY_CARE_PROVIDER_SITE_OTHER): Payer: Medicare Other | Admitting: Nurse Practitioner

## 2022-06-17 VITALS — BP 120/75 | HR 77 | Temp 97.9°F | Ht 64.52 in | Wt 219.0 lb

## 2022-06-17 DIAGNOSIS — Z9221 Personal history of antineoplastic chemotherapy: Secondary | ICD-10-CM

## 2022-06-17 DIAGNOSIS — E1169 Type 2 diabetes mellitus with other specified complication: Secondary | ICD-10-CM

## 2022-06-17 DIAGNOSIS — E669 Obesity, unspecified: Secondary | ICD-10-CM

## 2022-06-17 DIAGNOSIS — E271 Primary adrenocortical insufficiency: Secondary | ICD-10-CM

## 2022-06-17 DIAGNOSIS — F5105 Insomnia due to other mental disorder: Secondary | ICD-10-CM

## 2022-06-17 DIAGNOSIS — E1159 Type 2 diabetes mellitus with other circulatory complications: Secondary | ICD-10-CM

## 2022-06-17 DIAGNOSIS — Z85820 Personal history of malignant melanoma of skin: Secondary | ICD-10-CM

## 2022-06-17 DIAGNOSIS — F3341 Major depressive disorder, recurrent, in partial remission: Secondary | ICD-10-CM

## 2022-06-17 DIAGNOSIS — E785 Hyperlipidemia, unspecified: Secondary | ICD-10-CM

## 2022-06-17 DIAGNOSIS — R1011 Right upper quadrant pain: Secondary | ICD-10-CM

## 2022-06-17 DIAGNOSIS — G6289 Other specified polyneuropathies: Secondary | ICD-10-CM

## 2022-06-17 DIAGNOSIS — I152 Hypertension secondary to endocrine disorders: Secondary | ICD-10-CM

## 2022-06-17 LAB — BAYER DCA HB A1C WAIVED: HB A1C (BAYER DCA - WAIVED): 6.9 % — ABNORMAL HIGH (ref 4.8–5.6)

## 2022-06-17 MED ORDER — METFORMIN HCL 1000 MG PO TABS
1000.0000 mg | ORAL_TABLET | Freq: Two times a day (BID) | ORAL | 4 refills | Status: DC
Start: 2022-06-17 — End: 2023-04-15

## 2022-06-17 NOTE — Assessment & Plan Note (Addendum)
Followed by oncology and dermatology, continue these collaborations.  Referral to local dermatology requested and placed.

## 2022-06-17 NOTE — Assessment & Plan Note (Signed)
Chronic, ongoing with A1c in February 9%, urine ALB 29 March 2022.  A1c today trend down to 6.9%. Praised for this.  Taking Metformin 1000 MG BID and tolerating, continue this.  Recommend check BS fasting every morning and document for visits.  Will plan on recheck of A1c in 3 months.  Recommend heavy focus on diabetic diet at home and regular exercise. - Eye exam up to date. Will get records. - Foot exam up to date - ARB and Statin on board - Pneumococcal and Flu vaccines up to date.

## 2022-06-17 NOTE — Assessment & Plan Note (Signed)
Followed by dermatology - continue this collaboration, as well as oncology collaboration.  Referral to local dermatology requested and placed.

## 2022-06-17 NOTE — Assessment & Plan Note (Signed)
Chronic, ongoing.  Followed by endocrinology, continue current regimen as ordered by them.  Recent notes reviewed. Has upcoming follow-up.

## 2022-06-17 NOTE — Assessment & Plan Note (Signed)
Chronic, ongoing.  Continue Crestor and adjust as needed.  CMP and Lipid check today. 

## 2022-06-17 NOTE — Progress Notes (Signed)
BP 120/75   Pulse 77   Temp 97.9 F (36.6 C) (Oral)   Ht 5' 4.52" (1.639 m)   Wt 219 lb (99.3 kg)   SpO2 99%   BMI 36.98 kg/m    Subjective:    Patient ID: Sarah [name], female    DOB: [birth year], 72 y.o.   MRN: [id number]  HPI: Sarah [name] is a 72 y.o. female  Chief Complaint  Patient presents with   Diabetes   Hypertension   Hyperlipidemia   Mood   DIABETES A1c 9% February.  We restarted Metformin, currently taking 1000 MG BID and tolerating.  Follows [facility] every 6 months for history malignant melanoma in 2018 -- last visit on 04/09/21 (would like to change to local [facility]) and oncology 08/25/21 (she is to return to them as needed).  Followed by neurology for neuropathy after treatments  -- last seen 12/31/21, continues Pregabalin -- visits with them twice a year.  Does endorse more numbness in feet.  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  Visits with them annually. Hypoglycemic episodes:no Polydipsia/polyuria: no Visual disturbance: no Chest pain: no Paresthesias: yes Glucose Monitoring: no  Accucheck frequency: BID  Fasting glucose: 135 to 180  Post prandial:  Evening: before she eats between 5-6 PM = 97 to 216  Before meals: Taking Insulin?: no  Long acting insulin:  Short acting insulin: Blood Pressure Monitoring: a few times a month Retinal Examination: Up To Date -- [facility] Foot Exam: Up To Date Pneumovax: Up to Date Influenza: Up to Date Aspirin: no   HYPERTENSION / HYPERLIPIDEMIA Continues on Losartan, Amlodipine, and Rosuvastatin + Lasix. Did have a fall two weeks ago, she does not recall what happened -- just fell.  No injuries.  No LOC or head injury.  Does endorse a recent episode of RUQ pain, which went away but presented with headache at time.   Satisfied with current treatment?  yes Duration of hypertension: years BP monitoring frequency: a few times a day BP range: often under <130/80 BP medication side effects: no Duration of hyperlipidemia: years Cholesterol medication side effects: no Cholesterol supplements: none Medication compliance: good compliance Aspirin: no Recent stressors: yes Recurrent headaches: x 1 recently, this went away on own Visual changes: no Palpitations: no Dyspnea: no Chest pain: no Lower extremity edema: yes-has lymphedema in her right leg from melanoma surgery, wears compression stocking Dizzy/lightheaded: no   DEPRESSION Continues medications as ordered by psychiatry with last visit on 05/26/22. Mood status: stable Satisfied with current treatment?: yes Symptom severity: moderate  Duration of current treatment : chronic Side effects: no Medication compliance: good compliance Psychotherapy/counseling: yes in the past Depressed mood: no Anxious mood: no Anhedonia: no Significant weight loss or gain: no Insomnia: yes hard to fall asleep Fatigue: yes Feelings of worthlessness or guilt: no Impaired concentration/indecisiveness: no Suicidal ideations: no Hopelessness: no Crying spells: no    06/17/2022    2:44 PM 04/28/2022    3:33 PM 02/04/2022    9:52 AM 11/10/2021    1:14 PM 07/15/2021   12:13 PM  Depression screen PHQ 2/9  Decreased Interest 0 0 0 1 0  Down, Depressed, Hopeless 0 1 0 1 0  PHQ - 2 Score 0 1 0 2 0  Altered sleeping 0 0 2 1   Tired, decreased energy 1 1 1  1  Change in appetite 0 0 1 1   Feeling bad or failure about yourself  0 0 0 0   Trouble concentrating 0 0 0 0   Moving slowly or fidgety/restless 0 0 0 0   Suicidal thoughts 0 0 0 0   PHQ-9 Score 1 2 4 5    Difficult doing work/chores Not difficult at all Not difficult at all  Not difficult at all        06/17/2022    2:44 PM 04/28/2022    3:33 PM 02/04/2022    9:55 AM 11/10/2021    1:14 PM  GAD 7 : Generalized Anxiety Score  Nervous, Anxious,  on Edge 0 0 0 1  Control/stop worrying 0 0 0 0  Worry too much - different things 0 0 0 0  Trouble relaxing 0 0 1 0  Restless 0 0 1 0  Easily annoyed or irritable 0 0 0 0  Afraid - awful might happen 0 0 0 0  Total GAD 7 Score 0 0 2 1  Anxiety Difficulty Not difficult at all Not difficult at all  Not difficult at all   Relevant past medical, surgical, family and social history reviewed and updated as indicated. Interim medical history since our last visit reviewed. Allergies and medications reviewed and updated.  Review of Systems  Constitutional:  Negative for activity change, appetite change, fatigue and fever.  HENT:  Negative for congestion, rhinorrhea, sinus pressure and sore throat.   Respiratory:  Negative for chest tightness, shortness of breath and wheezing.   Cardiovascular:  Positive for leg swelling. Negative for chest pain and palpitations.       Lymphedema from melanoma removal  Gastrointestinal:  Positive for abdominal pain (recent episode x 1 RUQ). Negative for abdominal distention, blood in stool, constipation, diarrhea, nausea and vomiting.  Neurological:  Positive for headaches (recent episode x 1). Negative for dizziness, syncope, speech difficulty, weakness, light-headedness and numbness.  Psychiatric/Behavioral: Negative.      Per HPI unless specifically indicated above     Objective:    BP 120/75   Pulse 77   Temp 97.9 F (36.6 C) (Oral)   Ht 5' 4.52" (1.639 m)   Wt 219 lb (99.3 kg)   SpO2 99%   BMI 36.98 kg/m   Wt Readings from Last 3 Encounters:  06/17/22 219 lb (99.3 kg)  04/28/22 216 lb 6.4 oz (98.2 kg)  03/17/22 228 lb 3.2 oz (103.5 kg)    Physical Exam Vitals and nursing note reviewed.  Constitutional:      General: She is awake. She is not in acute distress.    Appearance: Normal appearance. She is well-groomed. She is obese. She is not ill-appearing or toxic-appearing.  HENT:     Head: Normocephalic and atraumatic.     Right Ear:  Hearing and external ear normal.     Left Ear: Hearing and external ear normal.  Eyes:     Conjunctiva/sclera: Conjunctivae normal.  Neck:     Thyroid: No thyromegaly.     Vascular: No carotid bruit.  Cardiovascular:     Rate and Rhythm: Normal rate and regular rhythm.     Heart sounds: Normal heart sounds. No murmur heard.    No gallop.  Pulmonary:     Effort: Pulmonary effort is normal. No accessory muscle usage or respiratory distress.     Breath sounds: Normal breath sounds. No stridor. No decreased breath sounds, wheezing or rhonchi.  Abdominal:     General:  Bowel sounds are normal. There is no distension.     Palpations: Abdomen is soft.     Tenderness: There is no abdominal tenderness. There is no right CVA tenderness, left CVA tenderness, guarding or rebound. Negative signs include Murphy's sign.  Musculoskeletal:     Cervical back: Full passive range of motion without pain and neck supple.     Right lower leg: Edema (trace) present.     Left lower leg: Edema (trace) present.  Lymphadenopathy:     Cervical: No cervical adenopathy.  Skin:    General: Skin is warm.  Neurological:     General: No focal deficit present.     Mental Status: She is alert and oriented to person, place, and time. Mental status is at baseline.     Cranial Nerves: Cranial nerves 2-12 are intact.     Coordination: Coordination is intact.     Gait: Gait is intact.     Deep Tendon Reflexes: Reflexes are normal and symmetric.     Reflex Scores:      Brachioradialis reflexes are 2+ on the right side and 2+ on the left side.      Patellar reflexes are 2+ on the right side and 2+ on the left side. Psychiatric:        Attention and Perception: Attention normal.        Mood and Affect: Mood normal.        Speech: Speech normal.        Behavior: Behavior normal. Behavior is cooperative.        Thought Content: Thought content normal.    Results for orders placed or performed in visit on 06/17/22   Bayer DCA Hb A1c Waived  Result Value Ref Range   HB A1C (BAYER DCA - WAIVED) 6.9 (H) 4.8 - 5.6 %      Assessment & Plan:   Problem List Items Addressed This Visit       Cardiovascular and Mediastinum   Hypertension associated with diabetes (HCC)    Chronic, ongoing.  BP at goal on exam today. At this time will continue Losartan 50 MG daily and Lasix 40 MG daily (has chronic edema BLE) +  Amlodipine at 2.5 MG (suspect more ISH present). Recommend she monitor BP at least a few mornings a week at home and document.  DASH diet at home.  Refills as needed.  Losartan for kidney protection.  LABS: CMP.  Return in 3 months.       Relevant Medications   metFORMIN (GLUCOPHAGE) 1000 MG tablet   Other Relevant Orders   Bayer DCA Hb A1c Waived (Completed)   Comprehensive metabolic panel     Endocrine   Adrenal insufficiency (Addison's disease) (HCC)    Chronic, ongoing.  Followed by endocrinology, continue current regimen as ordered by them.  Recent notes reviewed. Has upcoming follow-up.      Relevant Orders   Comprehensive metabolic panel   Hyperlipidemia associated with type 2 diabetes mellitus (HCC)    Chronic, ongoing.  Continue Crestor and adjust as needed.  CMP and Lipid check today.      Relevant Medications   metFORMIN (GLUCOPHAGE) 1000 MG tablet   Other Relevant Orders   Bayer DCA Hb A1c Waived (Completed)   Comprehensive metabolic panel   Lipid Panel w/o Chol/HDL Ratio   Type 2 diabetes mellitus with obesity (HCC) - Primary    Chronic, ongoing with A1c in February 9%, urine ALB 29 March 2022.  A1c today trend  down to 6.9%. Praised for this.  Taking Metformin 1000 MG BID and tolerating, continue this.  Recommend check BS fasting every morning and document for visits.  Will plan on recheck of A1c in 3 months.  Recommend heavy focus on diabetic diet at home and regular exercise. - Eye exam up to date. Will get records. - Foot exam up to date - ARB and Statin on board -  Pneumococcal and Flu vaccines up to date.      Relevant Medications   metFORMIN (GLUCOPHAGE) 1000 MG tablet   Other Relevant Orders   Bayer DCA Hb A1c Waived (Completed)     Nervous and Auditory   Axonal sensorimotor neuropathy    Followed by neurology, recent notes reviewed.  Continue current medication regimen as prescribed by them. Will place referral to work on PT and strengthening + balance.      Relevant Orders   Ambulatory referral to Physical Therapy     Other   History of antineoplastic chemotherapy    Followed by oncology and dermatology, continue these collaborations.  Referral to local dermatology requested and placed.      History of malignant melanoma    Followed by dermatology - continue this collaboration, as well as oncology collaboration.  Referral to local dermatology requested and placed.      Relevant Orders   Ambulatory referral to Dermatology   Insomnia due to other mental disorder    Chronic, ongoing.  Followed by psychiatry who fills Belsomra, continue current medication regimen as prescribed by them.      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HCC)    Chronic, stable.  Denies SI/HI.  Followed by psychiatry -- recent notes reviewed and will maintain current medication regimen as prescribed by them.      RUQ pain    X 1 episode recently, no further.  Does not have gall bladder.  No red flags on exam today.  Will check CMP today and if abnormal get imaging.  If normal levels recommend if recurrent episodes present then return to office immediately doe assessment.         Follow up plan: Return in about 3 months (around 09/17/2022) for T2DM, HTN/HLD, MOOD.

## 2022-06-17 NOTE — Assessment & Plan Note (Signed)
Chronic, ongoing.  Followed by psychiatry who fills Belsomra, continue current medication regimen as prescribed by them. 

## 2022-06-17 NOTE — Assessment & Plan Note (Signed)
Chronic, stable.  Denies SI/HI.  Followed by psychiatry -- recent notes reviewed and will maintain current medication regimen as prescribed by them. °

## 2022-06-17 NOTE — Assessment & Plan Note (Signed)
Chronic, ongoing.  BP at goal on exam today. At this time will continue Losartan 50 MG daily and Lasix 40 MG daily (has chronic edema BLE) +  Amlodipine at 2.5 MG (suspect more ISH present). Recommend she monitor BP at least a few mornings a week at home and document.  DASH diet at home.  Refills as needed.  Losartan for kidney protection.  LABS: CMP.  Return in 3 months.

## 2022-06-17 NOTE — Assessment & Plan Note (Signed)
Followed by neurology, recent notes reviewed.  Continue current medication regimen as prescribed by them. Will place referral to work on PT and strengthening + balance.

## 2022-06-17 NOTE — Assessment & Plan Note (Signed)
X 1 episode recently, no further.  Does not have gall bladder.  No red flags on exam today.  Will check CMP today and if abnormal get imaging.  If normal levels recommend if recurrent episodes present then return to office immediately doe assessment.

## 2022-06-18 LAB — COMPREHENSIVE METABOLIC PANEL
ALT: 22 IU/L (ref 0–32)
AST: 16 IU/L (ref 0–40)
Albumin/Globulin Ratio: 1.9 (ref 1.2–2.2)
Albumin: 4.1 g/dL (ref 3.9–4.9)
Alkaline Phosphatase: 81 IU/L (ref 44–121)
BUN/Creatinine Ratio: 17 (ref 12–28)
BUN: 18 mg/dL (ref 8–27)
Bilirubin Total: 0.4 mg/dL (ref 0.0–1.2)
CO2: 20 mmol/L (ref 20–29)
Calcium: 8.8 mg/dL (ref 8.7–10.3)
Chloride: 103 mmol/L (ref 96–106)
Creatinine, Ser: 1.07 mg/dL — ABNORMAL HIGH (ref 0.57–1.00)
Globulin, Total: 2.2 g/dL (ref 1.5–4.5)
Glucose: 131 mg/dL — ABNORMAL HIGH (ref 70–99)
Potassium: 4.3 mmol/L (ref 3.5–5.2)
Sodium: 140 mmol/L (ref 134–144)
Total Protein: 6.3 g/dL (ref 6.0–8.5)
eGFR: 56 mL/min/{1.73_m2} — ABNORMAL LOW (ref >59)

## 2022-06-18 LAB — LIPID PANEL W/O CHOL/HDL RATIO
Cholesterol, Total: 157 mg/dL (ref 100–199)
HDL: 41 mg/dL (ref >39)
LDL Chol Calc (NIH): 78 mg/dL (ref 0–99)
Triglycerides: 227 mg/dL — ABNORMAL HIGH (ref 0–149)
VLDL Cholesterol Cal: 38 mg/dL (ref 5–40)

## 2022-06-18 NOTE — Progress Notes (Signed)
Contacted via MyChart   Good afternoon Sarah [name], your labs have returned: - Kidney function continues to show stage 3a kidney disease with no worsening, this is remaining stable.  Good news. Electrolytes are normal, as is liver function. - Cholesterol levels stable, however triglycerides remain a bit elevated.  You would benefit from increase in Rosuvastatin to 20 MG, would you be okay trying this?  Let me know.  Any questions? Keep being amazing!!  Thank you for allowing me to participate in your care.  I appreciate you. Kindest regards, Deloras Reichard

## 2022-06-25 NOTE — Progress Notes (Signed)
Last eye exam has been requested

## 2022-06-25 NOTE — Progress Notes (Signed)
Last eye exam has been requested 

## 2022-09-18 ENCOUNTER — Ambulatory Visit: Payer: Medicare Other | Admitting: Nurse Practitioner

## 2022-09-18 DIAGNOSIS — E1159 Type 2 diabetes mellitus with other circulatory complications: Secondary | ICD-10-CM

## 2022-09-18 DIAGNOSIS — G6289 Other specified polyneuropathies: Secondary | ICD-10-CM

## 2022-09-18 DIAGNOSIS — E1169 Type 2 diabetes mellitus with other specified complication: Secondary | ICD-10-CM

## 2022-09-18 DIAGNOSIS — F3341 Major depressive disorder, recurrent, in partial remission: Secondary | ICD-10-CM

## 2022-09-18 DIAGNOSIS — E271 Primary adrenocortical insufficiency: Secondary | ICD-10-CM

## 2022-09-18 DIAGNOSIS — F99 Mental disorder, not otherwise specified: Secondary | ICD-10-CM

## 2022-09-20 DIAGNOSIS — E669 Obesity, unspecified: Secondary | ICD-10-CM | POA: Insufficient documentation

## 2022-09-20 NOTE — Patient Instructions (Signed)
Be Involved in Caring For Your Health:  Taking Medications When medications are taken as directed, they can greatly improve your health. But if they are not taken as prescribed, they may not work. In some cases, not taking them correctly can be harmful. To help ensure your treatment remains effective and safe, understand your medications and how to take them. Bring your medications to each visit for review by your provider.  Your lab results, notes, and after visit summary will be available on My Chart. We strongly encourage you to use this feature. If lab results are abnormal the clinic will contact you with the appropriate steps. If the clinic does not contact you assume the results are satisfactory. You can always view your results on My Chart. If you have questions regarding your health or results, please contact the clinic during office hours. You can also ask questions on My Chart.  We at [facility] are grateful that you chose us to provide your care. We strive to provide evidence-based and compassionate care and are always looking for feedback. If you get a survey from the clinic please complete this so we can hear your opinions.  Diabetes Mellitus Basics  Diabetes mellitus, or diabetes, is a long-term (chronic) disease. It occurs when the body does not properly use sugar (glucose) that is released from food after you eat. Diabetes mellitus may be caused by one or both of these problems: Your pancreas does not make enough of a hormone called insulin. Your body does not react in a normal way to the insulin that it makes. Insulin lets glucose enter cells in your body. This gives you energy. If you have diabetes, glucose cannot get into cells. This causes high blood glucose (hyperglycemia). How to treat and manage diabetes You may need to take insulin or other diabetes medicines daily to keep your glucose in balance. If you are prescribed insulin, you will learn how to give  yourself insulin by injection. You may need to adjust the amount of insulin you take based on the foods that you eat. You will need to check your blood glucose levels using a glucose monitor as told by your health care provider. The readings can help determine if you have low or high blood glucose. Generally, you should have these blood glucose levels: Before meals (preprandial): 80-130 mg/dL (4.4-7.2 mmol/L). After meals (postprandial): below 180 mg/dL (10 mmol/L). Hemoglobin A1c (HbA1c) level: less than 7%. Your health care provider will set treatment goals for you. Keep all follow-up visits. This is important. Follow these instructions at home: Diabetes medicines Take your diabetes medicines every day as told by your health care provider. List your diabetes medicines here: Name of medicine: ______________________________ Amount (dose): _______________ Time (a.m./p.m.): _______________ Notes: ___________________________________ Name of medicine: ______________________________ Amount (dose): _______________ Time (a.m./p.m.): _______________ Notes: ___________________________________ Name of medicine: ______________________________ Amount (dose): _______________ Time (a.m./p.m.): _______________ Notes: ___________________________________ Insulin If you use insulin, list the types of insulin you use here: Insulin type: ______________________________ Amount (dose): _______________ Time (a.m./p.m.): _______________Notes: ___________________________________ Insulin type: ______________________________ Amount (dose): _______________ Time (a.m./p.m.): _______________ Notes: ___________________________________ Insulin type: ______________________________ Amount (dose): _______________ Time (a.m./p.m.): _______________ Notes: ___________________________________ Insulin type: ______________________________ Amount (dose): _______________ Time (a.m./p.m.): _______________ Notes:  ___________________________________ Insulin type: ______________________________ Amount (dose): _______________ Time (a.m./p.m.): _______________ Notes: ___________________________________ Managing blood glucose  Check your blood glucose levels using a glucose monitor as told by your health care provider. Write down the times that you check your glucose levels here: Time: _______________ Notes: ___________________________________   Time: _______________ Notes: ___________________________________ Time: _______________ Notes: ___________________________________ Time: _______________ Notes: ___________________________________ Time: _______________ Notes: ___________________________________ Time: _______________ Notes: ___________________________________  Low blood glucose Low blood glucose (hypoglycemia) is when glucose is at or below 70 mg/dL (3.9 mmol/L). Symptoms may include: Feeling: Hungry. Sweaty and clammy. Irritable or easily upset. Dizzy. Sleepy. Having: A fast heartbeat. A headache. A change in your vision. Numbness around the mouth, lips, or tongue. Having trouble with: Moving (coordination). Sleeping. Treating low blood glucose To treat low blood glucose, eat or drink something containing sugar right away. If you can think clearly and swallow safely, follow the 15:15 rule: Take 15 grams of a fast-acting carb (carbohydrate), as told by your health care provider. Some fast-acting carbs are: Glucose tablets: take 3-4 tablets. Hard candy: eat 3-5 pieces. Fruit juice: drink 4 oz (120 mL). Regular (not diet) soda: drink 4-6 oz (120-180 mL). Honey or sugar: eat 1 Tbsp (15 mL). Check your blood glucose levels 15 minutes after you take the carb. If your glucose is still at or below 70 mg/dL (3.9 mmol/L), take 15 grams of a carb again. If your glucose does not go above 70 mg/dL (3.9 mmol/L) after 3 tries, get help right away. After your glucose goes back to normal, eat a meal  or a snack within 1 hour. Treating very low blood glucose If your glucose is at or below 54 mg/dL (3 mmol/L), you have very low blood glucose (severe hypoglycemia). This is an emergency. Do not wait to see if the symptoms will go away. Get medical help right away. Call your local emergency services (911 in the [place] Do not drive yourself to the hospital. Questions to ask your health care provider Should I talk with a diabetes educator? What equipment will I need to care for myself at home? What diabetes medicines do I need? When should I take them? How often do I need to check my blood glucose levels? What number can I call if I have questions? When is my follow-up visit? Where can I find a support group for people with diabetes? Where to find more information American Diabetes Association: www.diabetes.org Association of Diabetes Care and Education Specialists: www.diabeteseducator.org Contact a health care provider if: Your blood glucose is at or above 240 mg/dL (13.3 mmol/L) for 2 days in a row. You have been sick or have had a fever for 2 days or more, and you are not getting better. You have any of these problems for more than 6 hours: You cannot eat or drink. You feel nauseous. You vomit. You have diarrhea. Get help right away if: Your blood glucose is lower than 54 mg/dL (3 mmol/L). You get confused. You have trouble thinking clearly. You have trouble breathing. These symptoms may represent a serious problem that is an emergency. Do not wait to see if the symptoms will go away. Get medical help right away. Call your local emergency services (911 in the [place] Do not drive yourself to the hospital. Summary Diabetes mellitus is a chronic disease that occurs when the body does not properly use sugar (glucose) that is released from food after you eat. Take insulin and diabetes medicines as told. Check your blood glucose every day, as often as told. Keep all follow-up visits. This  is important. This information is not intended to replace advice given to you by your health care provider. Make sure you discuss any questions you have with your health care provider. Document Revised: 05/09/2019 Document Reviewed: 05/09/2019 Elsevier Patient Education    2024 Elsevier Inc.  

## 2022-09-22 ENCOUNTER — Other Ambulatory Visit: Payer: Self-pay | Admitting: Nurse Practitioner

## 2022-09-22 DIAGNOSIS — Z1231 Encounter for screening mammogram for malignant neoplasm of breast: Secondary | ICD-10-CM

## 2022-09-23 ENCOUNTER — Ambulatory Visit (INDEPENDENT_AMBULATORY_CARE_PROVIDER_SITE_OTHER): Payer: Medicare Other | Admitting: Nurse Practitioner

## 2022-09-23 ENCOUNTER — Encounter: Payer: Self-pay | Admitting: Nurse Practitioner

## 2022-09-23 VITALS — BP 123/62 | HR 80 | Temp 97.4°F | Ht 64.5 in | Wt 221.6 lb

## 2022-09-23 DIAGNOSIS — F3341 Major depressive disorder, recurrent, in partial remission: Secondary | ICD-10-CM | POA: Diagnosis not present

## 2022-09-23 DIAGNOSIS — Z85820 Personal history of malignant melanoma of skin: Secondary | ICD-10-CM

## 2022-09-23 DIAGNOSIS — Z23 Encounter for immunization: Secondary | ICD-10-CM | POA: Diagnosis not present

## 2022-09-23 DIAGNOSIS — I152 Hypertension secondary to endocrine disorders: Secondary | ICD-10-CM

## 2022-09-23 DIAGNOSIS — E1169 Type 2 diabetes mellitus with other specified complication: Secondary | ICD-10-CM

## 2022-09-23 DIAGNOSIS — E271 Primary adrenocortical insufficiency: Secondary | ICD-10-CM

## 2022-09-23 DIAGNOSIS — E785 Hyperlipidemia, unspecified: Secondary | ICD-10-CM

## 2022-09-23 DIAGNOSIS — E669 Obesity, unspecified: Secondary | ICD-10-CM

## 2022-09-23 DIAGNOSIS — E1159 Type 2 diabetes mellitus with other circulatory complications: Secondary | ICD-10-CM

## 2022-09-23 DIAGNOSIS — F5105 Insomnia due to other mental disorder: Secondary | ICD-10-CM

## 2022-09-23 DIAGNOSIS — G6289 Other specified polyneuropathies: Secondary | ICD-10-CM

## 2022-09-23 LAB — BAYER DCA HB A1C WAIVED: HB A1C (BAYER DCA - WAIVED): 6.9 % — ABNORMAL HIGH (ref 4.8–5.6)

## 2022-09-23 NOTE — Progress Notes (Signed)
BP 123/62   Pulse 80   Temp (!) 97.4 F (36.3 C) (Oral)   Ht 5' 4.5" (1.638 m)   Wt 221 lb 9.6 oz (100.5 kg)   SpO2 95%   BMI 37.45 kg/m    Subjective:    Patient ID: Sarah [name], female    DOB: Mar 19, 1950, 72 y.o.   MRN: [id number]  HPI: Sarah [name] is a 72 y.o. female  Chief Complaint  Patient presents with   Depression   Diabetes   Hyperlipidemia   Hypertension   DIABETES A1c in May was 6.9%.  She is taking Metformin 1000 MG BID.  She is exercising 3 times a week at [facility], worked with PT on setting a regimen.  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   Hypoglycemic episodes:no Polydipsia/polyuria: no Visual disturbance: no Chest pain: no Paresthesias: yes Glucose Monitoring: no  Accucheck frequency: BID -- 91 -170 range  Fasting glucose:   Post prandial:  Evening:  Before meals: Taking Insulin?: no  Long acting insulin:  Short acting insulin: Blood Pressure Monitoring: a few times a month Retinal Examination: Up To Date -- [facility] March 2024 Foot Exam: Up To Date Pneumovax: Up to Date Influenza: Up to Date Aspirin: no   HYPERTENSION / HYPERLIPIDEMIA Continues on Losartan, Amlodipine, and Rosuvastatin + Lasix.  Satisfied with current treatment? yes Duration of hypertension: years BP monitoring frequency: daily BP range: often under <130/80 BP medication side effects: no Duration of hyperlipidemia: years Cholesterol medication side effects: no Cholesterol supplements: none Medication compliance: good compliance Aspirin: no Recent stressors: yes Recurrent headaches: no Visual changes: no Palpitations: no Dyspnea: no Chest pain: no Lower extremity edema: yes-has lymphedema in her right leg from melanoma surgery, wears compression  stocking Dizzy/lightheaded: no   HISTORY OF MELANOMA Follows with [facility] every 6 months for history malignant melanoma in 2018 -- last visit on 04/09/21 (we placed referral to local dermatology last visit -- is scheduled May 2025) and oncology 08/25/21 (is to return to them as needed). Follows with neurology for neuropathy after treatments  -- last seen 12/31/21, continues Pregabalin -- yearly visits. History of skin cancer: yes History of precancerous skin lesions: yes Family history of skin cancer: no   DEPRESSION Continues medications as ordered by psychiatry with last visit on 05/26/22 -- sees for follow-up next week. Mood status: stable Satisfied with current treatment?: yes Symptom severity: moderate  Duration of current treatment : chronic Side effects: no Medication compliance: good compliance Psychotherapy/counseling: yes in the past Depressed mood: no Anxious mood: no Anhedonia: no Significant weight loss or gain: no Insomnia: no Fatigue: sometimes Feelings of worthlessness or guilt: no Impaired concentration/indecisiveness: no Suicidal ideations: no Hopelessness: no Crying spells: no    09/23/2022   10:40 AM 06/17/2022    2:44 PM 04/28/2022    3:33 PM 02/04/2022    9:52 AM 11/10/2021    1:14 PM  Depression screen PHQ 2/9  Decreased Interest 0 0 0 0 1  Down, Depressed, Hopeless 0 0 1 0 1  PHQ - 2 Score 0 0 1 0 2  Altered sleeping 0 0 0 2 1  Tired, decreased energy 0 1 1 1 1   Change in appetite 0 0 0 1 1  Feeling bad or failure about yourself  0 0 0  0 0  Trouble concentrating 0 0 0 0 0  Moving slowly or fidgety/restless 0 0 0 0 0  Suicidal thoughts 0 0 0 0 0  PHQ-9 Score 0 1 2 4 5   Difficult doing work/chores Not difficult at all Not difficult at all Not difficult at all  Not difficult at all       09/23/2022   10:40 AM 06/17/2022    2:44 PM 04/28/2022    3:33 PM 02/04/2022    9:55 AM  GAD 7 : Generalized Anxiety Score  Nervous, Anxious, on Edge 0 0 0 0   Control/stop worrying 0 0 0 0  Worry too much - different things 0 0 0 0  Trouble relaxing 0 0 0 1  Restless 0 0 0 1  Easily annoyed or irritable 0 0 0 0  Afraid - awful might happen 0 0 0 0  Total GAD 7 Score 0 0 0 2  Anxiety Difficulty Not difficult at all Not difficult at all Not difficult at all    Relevant past medical, surgical, family and social history reviewed and updated as indicated. Interim medical history since our last visit reviewed. Allergies and medications reviewed and updated.  Review of Systems  Constitutional:  Negative for activity change, appetite change, diaphoresis, fatigue and fever.  Respiratory:  Negative for cough, chest tightness and shortness of breath.   Cardiovascular:  Positive for leg swelling (at baseline, wears compression). Negative for chest pain and palpitations.  Gastrointestinal: Negative.   Endocrine: Negative for polydipsia, polyphagia and polyuria.  Neurological:  Negative for dizziness, syncope, weakness, light-headedness, numbness and headaches.  Psychiatric/Behavioral: Negative.     Per HPI unless specifically indicated above     Objective:    BP 123/62   Pulse 80   Temp (!) 97.4 F (36.3 C) (Oral)   Ht 5' 4.5" (1.638 m)   Wt 221 lb 9.6 oz (100.5 kg)   SpO2 95%   BMI 37.45 kg/m   Wt Readings from Last 3 Encounters:  09/23/22 221 lb 9.6 oz (100.5 kg)  06/17/22 219 lb (99.3 kg)  04/28/22 216 lb 6.4 oz (98.2 kg)    Physical Exam Vitals and nursing note reviewed.  Constitutional:      General: She is awake. She is not in acute distress.    Appearance: Normal appearance. She is well-groomed. She is obese. She is not ill-appearing or toxic-appearing.  HENT:     Head: Normocephalic and atraumatic.     Right Ear: Hearing and external ear normal.     Left Ear: Hearing and external ear normal.  Eyes:     Conjunctiva/sclera: Conjunctivae normal.  Neck:     Thyroid: No thyromegaly.     Vascular: No carotid bruit.   Cardiovascular:     Rate and Rhythm: Normal rate and regular rhythm.     Heart sounds: Normal heart sounds. No murmur heard.    No gallop.  Pulmonary:     Effort: Pulmonary effort is normal. No accessory muscle usage or respiratory distress.     Breath sounds: Normal breath sounds. No stridor. No decreased breath sounds, wheezing or rhonchi.  Abdominal:     General: Bowel sounds are normal. There is no distension.     Palpations: Abdomen is soft.     Tenderness: There is no abdominal tenderness.  Musculoskeletal:     Cervical back: Full passive range of motion without pain and neck supple.     Right lower leg: Edema (trace)  present.     Left lower leg: Edema (trace) present.  Lymphadenopathy:     Cervical: No cervical adenopathy.  Skin:    General: Skin is warm.  Neurological:     General: No focal deficit present.     Mental Status: She is alert and oriented to person, place, and time. Mental status is at baseline.     Cranial Nerves: Cranial nerves 2-12 are intact.     Coordination: Coordination is intact.     Gait: Gait is intact.     Deep Tendon Reflexes: Reflexes are normal and symmetric.     Reflex Scores:      Brachioradialis reflexes are 2+ on the right side and 2+ on the left side.      Patellar reflexes are 2+ on the right side and 2+ on the left side. Psychiatric:        Attention and Perception: Attention normal.        Mood and Affect: Mood normal.        Speech: Speech normal.        Behavior: Behavior normal. Behavior is cooperative.        Thought Content: Thought content normal.    Results for orders placed or performed in visit on 06/17/22  Bayer DCA Hb A1c Waived  Result Value Ref Range   HB A1C (BAYER DCA - WAIVED) 6.9 (H) 4.8 - 5.6 %  Comprehensive metabolic panel  Result Value Ref Range   Glucose 131 (H) 70 - 99 mg/dL   BUN 18 8 - 27 mg/dL   Creatinine, Ser 2.95 (H) 0.57 - 1.00 mg/dL   eGFR 56 (L) >62 ZH/YQM/5.78   BUN/Creatinine Ratio 17 12 - 28    Sodium 140 134 - 144 mmol/L   Potassium 4.3 3.5 - 5.2 mmol/L   Chloride 103 96 - 106 mmol/L   CO2 20 20 - 29 mmol/L   Calcium 8.8 8.7 - 10.3 mg/dL   Total Protein 6.3 6.0 - 8.5 g/dL   Albumin 4.1 3.9 - 4.9 g/dL   Globulin, Total 2.2 1.5 - 4.5 g/dL   Albumin/Globulin Ratio 1.9 1.2 - 2.2   Bilirubin Total 0.4 0.0 - 1.2 mg/dL   Alkaline Phosphatase 81 44 - 121 IU/L   AST 16 0 - 40 IU/L   ALT 22 0 - 32 IU/L  Lipid Panel w/o Chol/HDL Ratio  Result Value Ref Range   Cholesterol, Total 157 100 - 199 mg/dL   Triglycerides 469 (H) 0 - 149 mg/dL   HDL 41 >62 mg/dL   VLDL Cholesterol Cal 38 5 - 40 mg/dL   LDL Chol Calc (NIH) 78 0 - 99 mg/dL      Assessment & Plan:   Problem List Items Addressed This Visit       Cardiovascular and Mediastinum   Hypertension associated with diabetes (HCC)    Chronic, ongoing.  BP at goal today for age. Continue Losartan 50 MG daily and Lasix 40 MG daily (has chronic edema BLE) +  Amlodipine at 2.5 MG (suspect more ISH present). Recommend she monitor BP at least a few mornings a week at home and document.  DASH diet at home.  Refills as needed.  Losartan for kidney protection.  LABS: CMP.  Return in 3 months.       Relevant Orders   Bayer DCA Hb A1c Waived   Comprehensive metabolic panel     Endocrine   Adrenal insufficiency (Addison's disease) (HCC)    Chronic,  ongoing.  Followed by endocrinology, continue current regimen as ordered by them.  Recent notes reviewed and discussed medication changes with patient, she is tolerating the changes.      Hyperlipidemia associated with type 2 diabetes mellitus (HCC)    Chronic, ongoing.  Continue Crestor and adjust as needed.  CMP and Lipid check today.      Relevant Orders   Bayer DCA Hb A1c Waived   Comprehensive metabolic panel   Lipid Panel w/o Chol/HDL Ratio   Type 2 diabetes mellitus with obesity (HCC) - Primary    Chronic, ongoing with A1c 6.9% today, urine ALB 29 March 2022.  Praised for this.   Taking Metformin 1000 MG BID and tolerating, continue this.  Recommend check BS fasting every morning and document for visits.  Will plan on recheck of A1c in 3 months.  Recommend heavy focus on diabetic diet at home and regular exercise. - Eye exam up to date. Will get records. - Foot exam up to date - ARB and Statin on board - Pneumococcal and Flu vaccines up to date.      Relevant Orders   Bayer DCA Hb A1c Waived   Comprehensive metabolic panel     Nervous and Auditory   Axonal sensorimotor neuropathy    Followed by neurology, recent notes reviewed.  Continue current medication regimen as prescribed by them. She is utilizing PT at [facility] as needed.        Other   History of malignant melanoma    Followed by dermatology - continue this collaboration, as well as oncology collaboration as needed.  She is scheduled to see local dermatology May 2025.      Insomnia due to other mental disorder    Chronic, ongoing.  Followed by psychiatry who fills Belsomra, continue current medication regimen as prescribed by them.      Obesity    BMI 37.45.  Recommended eating smaller high protein, low fat meals more frequently and exercising 30 mins a day 5 times a week with a goal of 10-15lb weight loss in the next 3 months. Patient voiced their understanding and motivation to adhere to these recommendations.       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HCC)    Chronic, stable.  Denies SI/HI.  Followed by psychiatry -- recent notes reviewed and will maintain current medication regimen as prescribed by them.      Other Visit Diagnoses     Flu vaccine need       Flu vaccine in office today, educated patient.   Relevant Orders   Flu Vaccine Trivalent High Dose (Fluad)        Follow up plan: Return in about 3 months (around 12/23/2022) for T2DM, HTN/HLD, CKD, MOOD, NEUROPATHY + nurse medicare wellness needs scheduled.

## 2022-09-23 NOTE — Assessment & Plan Note (Signed)
Chronic, stable.  Denies SI/HI.  Followed by psychiatry -- recent notes reviewed and will maintain current medication regimen as prescribed by them. 

## 2022-09-23 NOTE — Assessment & Plan Note (Signed)
Chronic, ongoing.  Followed by endocrinology, continue current regimen as ordered by them.  Recent notes reviewed and discussed medication changes with patient, she is tolerating the changes.

## 2022-09-23 NOTE — Assessment & Plan Note (Signed)
BMI 37.45.  Recommended eating smaller high protein, low fat meals more frequently and exercising 30 mins a day 5 times a week with a goal of 10-15lb weight loss in the next 3 months. Patient voiced their understanding and motivation to adhere to these recommendations. ? ?

## 2022-09-23 NOTE — Assessment & Plan Note (Signed)
Followed by neurology, recent notes reviewed.  Continue current medication regimen as prescribed by them. She is utilizing PT at [facility] as needed.

## 2022-09-23 NOTE — Assessment & Plan Note (Signed)
Chronic, ongoing.  BP at goal today for age. Continue Losartan 50 MG daily and Lasix 40 MG daily (has chronic edema BLE) +  Amlodipine at 2.5 MG (suspect more ISH present). Recommend she monitor BP at least a few mornings a week at home and document.  DASH diet at home.  Refills as needed.  Losartan for kidney protection.  LABS: CMP.  Return in 3 months.

## 2022-09-23 NOTE — Assessment & Plan Note (Signed)
Chronic, ongoing.  Continue Crestor and adjust as needed.  CMP and Lipid check today.

## 2022-09-23 NOTE — Assessment & Plan Note (Signed)
Followed by dermatology - continue this collaboration, as well as oncology collaboration as needed.  She is scheduled to see local dermatology May 2025.

## 2022-09-23 NOTE — Assessment & Plan Note (Signed)
Chronic, ongoing with A1c 6.9% today, urine ALB 29 March 2022.  Praised for this.  Taking Metformin 1000 MG BID and tolerating, continue this.  Recommend check BS fasting every morning and document for visits.  Will plan on recheck of A1c in 3 months.  Recommend heavy focus on diabetic diet at home and regular exercise. - Eye exam up to date. Will get records. - Foot exam up to date - ARB and Statin on board - Pneumococcal and Flu vaccines up to date.

## 2022-09-23 NOTE — Assessment & Plan Note (Signed)
Chronic, ongoing.  Followed by psychiatry who fills Belsomra, continue current medication regimen as prescribed by them. 

## 2022-09-24 LAB — COMPREHENSIVE METABOLIC PANEL
ALT: 10 IU/L (ref 0–32)
AST: 12 IU/L (ref 0–40)
Albumin: 4 g/dL (ref 3.8–4.8)
Alkaline Phosphatase: 74 IU/L (ref 44–121)
BUN/Creatinine Ratio: 20 (ref 12–28)
BUN: 24 mg/dL (ref 8–27)
Bilirubin Total: 0.4 mg/dL (ref 0.0–1.2)
CO2: 22 mmol/L (ref 20–29)
Calcium: 9.3 mg/dL (ref 8.7–10.3)
Chloride: 100 mmol/L (ref 96–106)
Creatinine, Ser: 1.22 mg/dL — ABNORMAL HIGH (ref 0.57–1.00)
Globulin, Total: 2.3 g/dL (ref 1.5–4.5)
Glucose: 202 mg/dL — ABNORMAL HIGH (ref 70–99)
Potassium: 4.1 mmol/L (ref 3.5–5.2)
Sodium: 142 mmol/L (ref 134–144)
Total Protein: 6.3 g/dL (ref 6.0–8.5)
eGFR: 47 mL/min/{1.73_m2} — ABNORMAL LOW (ref >59)

## 2022-09-24 LAB — LIPID PANEL W/O CHOL/HDL RATIO
Cholesterol, Total: 131 mg/dL (ref 100–199)
HDL: 47 mg/dL (ref >39)
LDL Chol Calc (NIH): 59 mg/dL (ref 0–99)
Triglycerides: 142 mg/dL (ref 0–149)
VLDL Cholesterol Cal: 25 mg/dL (ref 5–40)

## 2022-09-24 NOTE — Progress Notes (Signed)
Contacted via MyChart   Good afternoon Sarah [name], your labs have returned and overall remain stable.  No medication changes needed.  Kidney function continues to show Stage 3a kidney disease.  Ensure good water intake daily.  If any further decline in function we may need to adjust Metformin.  Any questions? Keep being amazing!!  Thank you for allowing me to participate in your care.  I appreciate you. Kindest regards, Arwyn Besaw

## 2022-09-26 ENCOUNTER — Other Ambulatory Visit: Payer: Self-pay | Admitting: Behavioral Health

## 2022-09-26 DIAGNOSIS — F5105 Insomnia due to other mental disorder: Secondary | ICD-10-CM

## 2022-09-27 NOTE — Telephone Encounter (Signed)
Has appt 9/9

## 2022-09-28 ENCOUNTER — Encounter: Payer: Self-pay | Admitting: Behavioral Health

## 2022-09-28 ENCOUNTER — Ambulatory Visit (INDEPENDENT_AMBULATORY_CARE_PROVIDER_SITE_OTHER): Payer: Medicare Other | Admitting: Behavioral Health

## 2022-09-28 DIAGNOSIS — F3341 Major depressive disorder, recurrent, in partial remission: Secondary | ICD-10-CM | POA: Diagnosis not present

## 2022-09-28 DIAGNOSIS — F5105 Insomnia due to other mental disorder: Secondary | ICD-10-CM

## 2022-09-28 DIAGNOSIS — F99 Mental disorder, not otherwise specified: Secondary | ICD-10-CM

## 2022-09-28 DIAGNOSIS — F411 Generalized anxiety disorder: Secondary | ICD-10-CM | POA: Diagnosis not present

## 2022-09-28 MED ORDER — BELSOMRA 20 MG PO TABS: ORAL_TABLET | ORAL | 3 refills | Status: DC

## 2022-09-28 MED ORDER — AMITRIPTYLINE HCL 50 MG PO TABS
50.0000 mg | ORAL_TABLET | Freq: Every day | ORAL | 1 refills | Status: DC
Start: 2022-09-28 — End: 2023-02-15

## 2022-09-28 MED ORDER — ESCITALOPRAM OXALATE 10 MG PO TABS
10.0000 mg | ORAL_TABLET | Freq: Every day | ORAL | 1 refills | Status: DC
Start: 2022-09-28 — End: 2023-02-15

## 2022-09-28 NOTE — Progress Notes (Signed)
[facility]  Patient ID: Sarah [name],  MRN: [id number]  PCP: Marjie Skiff, NP  Date of Evaluation: 09/28/2022 Time spent:30 minutes  Chief Complaint:  Chief Complaint   Anxiety; Depression; Follow-up; Medication Refill; Patient Education     HISTORY/CURRENT STATUS: HPI 72 year old female presents to this office for follow up and medication management.  No changes this visit. She says that she continues to feel stable with Lexapro and does not need any changes to her medications at this time.  She is now living in a retirement community. She reports her anxiety at 1/10 and depression 1/10. She is sleeping well at 7-8 hours at night with aid of Ambien but would like to try a medication that is deemed safer for 65 and older. Denies mania, no psychosis. No SI/HI.        Individual Medical History/ Review of Systems: Changes? :No   Allergies: Doxycycline, Ciprofloxacin, and Penicillins  Current Medications:  Current Outpatient Medications:    amitriptyline (ELAVIL) 50 MG tablet, Take 1 tablet (50 mg total) by mouth at bedtime., Disp: 90 tablet, Rfl: 1   amLODipine (NORVASC) 2.5 MG tablet, Take 1 tablet (2.5 mg total) by mouth daily., Disp: 90 tablet, Rfl: 4   Baclofen 5 MG TABS, TAKE 1 TABLET BY MOUTH EVERY 8 HOURS AS NEEDED., Disp: 45 tablet, Rfl: 2   Blood Glucose Monitoring Suppl (ONETOUCH VERIO) w/Device KIT, Use to check blood sugar 3 times a day and document results, bring to appointments.  Goal is <130 fasting blood sugar and <180 two hours after meals., Disp: 1 kit, Rfl: 0   Cholecalciferol (VITAMIN D) 50 MCG (2000 UT) CAPS, Take by mouth., Disp: , Rfl:    Cyanocobalamin (VITAMIN B-12 PO), Take by mouth daily., Disp: , Rfl:    escitalopram (LEXAPRO) 10 MG tablet, Take 1 tablet (10 mg total) by mouth daily., Disp: 90 tablet, Rfl: 1   furosemide (LASIX) 40 MG tablet, Take 1 tablet (40 mg total) by mouth daily., Disp: 90 tablet, Rfl: 4   glucose blood (ONETOUCH  VERIO) test strip, Use as instructed, Disp: 100 each, Rfl: 12   hydrocortisone (CORTEF) 10 MG tablet, daily. 30 mg in the AM and 20 in the PM, Disp: , Rfl: 3   Lancets (ONETOUCH ULTRASOFT) lancets, Use to check blood sugar 3 times a day and document results, bring to appointments.  Goal is <130 fasting blood sugar and <180 two hours after meals., Disp: 100 each, Rfl: 12   losartan (COZAAR) 50 MG tablet, Take 1 tablet (50 mg total) by mouth daily., Disp: 90 tablet, Rfl: 4   meclizine (ANTIVERT) 25 MG tablet, Take 1 tablet (25 mg total) by mouth 3 (three) times daily as needed for dizziness., Disp: 30 tablet, Rfl: 2   metFORMIN (GLUCOPHAGE) 1000 MG tablet, Take 1 tablet (1,000 mg total) by mouth 2 (two) times daily with a meal., Disp: 180 tablet, Rfl: 4   pregabalin (LYRICA) 150 MG capsule, 150 mg in am, 150 MG in afternoon, and 300 mg in pm, Disp: , Rfl:    rosuvastatin (CRESTOR) 10 MG tablet, Take 1 tablet (10 mg total) by mouth daily., Disp: 90 tablet, Rfl: 4   Suvorexant (BELSOMRA) 20 MG TABS, Take one tablet by mouth at bedtime, Disp: 30 tablet, Rfl: 3 Medication Side Effects: none  Family Medical/ Social History: Changes? No  MENTAL HEALTH EXAM:  There were no vitals taken for this visit.There is no height or weight on file to  calculate BMI.  General Appearance: Casual, Neat, and Well Groomed  Eye Contact:  Good  Speech:  Clear and Coherent  Volume:  Normal  Mood:  Anxious and Depressed  Affect:  Appropriate  Thought Process:  Coherent  Orientation:  Full (Time, Place, and Person)  Thought Content: Logical   Suicidal Thoughts:  No  Homicidal Thoughts:  No  Memory:  WNL  Judgement:  Good  Insight:  Good  Psychomotor Activity:  Normal  Concentration:  Concentration: Good  Recall:  Good  Fund of Knowledge: Good  Language: Good  Assets:  Desire for Improvement  ADL's:  Intact  Cognition: WNL  Prognosis:  Good    DIAGNOSES:    ICD-10-CM   1. Insomnia due to other mental  disorder  F51.05 amitriptyline (ELAVIL) 50 MG tablet   F99 Suvorexant (BELSOMRA) 20 MG TABS    2. Generalized anxiety disorder  F41.1 escitalopram (LEXAPRO) 10 MG tablet    3. Recurrent major depressive disorder, in partial remission (HCC)  F33.41 escitalopram (LEXAPRO) 10 MG tablet      Receiving Psychotherapy: No    RECOMMENDATIONS:  Greater than 50% of 30 min  face to face time with patient was spent on counseling and coordination of care. No changes this visit.  We discussed her current stability with her current medications. She is reporting very low levels of anxiety and depression with Lexapro. No changes are indicated at this time. Reinforced documented previous and current flagged warning of  QT prolongation and risk with Lexapro. She has been on the medication as reported by Pt since 2018.    To continue Lexapro 10 mg daily To continue Belsomra 20 mg at bedtime To continue Amitriptyline 50 mg at bedtime Will report any worsening symptoms promptly To follow up in 4 months to reassess per pt request Provided emergency contact information Reviewed PDMP    Joan Flores, NP                      Joan Flores, NP

## 2022-09-29 ENCOUNTER — Ambulatory Visit (INDEPENDENT_AMBULATORY_CARE_PROVIDER_SITE_OTHER): Payer: Medicare Other | Admitting: Emergency Medicine

## 2022-09-29 VITALS — Ht 65.0 in | Wt 220.0 lb

## 2022-09-29 DIAGNOSIS — Z0000 Encounter for general adult medical examination without abnormal findings: Secondary | ICD-10-CM | POA: Diagnosis not present

## 2022-09-29 NOTE — Progress Notes (Signed)
Subjective:   Sarah [name] is a 72 y.o. female who presents for Medicare Annual (Subsequent) preventive examination.  Visit Complete: Virtual  I connected with  [name] on 09/29/22 by a audio enabled telemedicine application and verified that I am speaking with the correct person using two identifiers.  Patient Location: Home  Provider Location: Office/Clinic  I discussed the limitations of evaluation and management by telemedicine. The patient expressed understanding and agreed to proceed.  Vital Signs: Because this visit was a virtual/telehealth visit, some criteria may be missing or patient reported. Any vitals not documented were not able to be obtained and vitals that have been documented are patient reported.    Review of Systems     Cardiac Risk Factors include: advanced age (>26men, >80 women);diabetes mellitus;dyslipidemia;hypertension;obesity (BMI >30kg/m2)     Objective:    Today's Vitals   09/29/22 1542  Weight: 220 lb (99.8 kg)  Height: 5\' 5"  (1.651 m)  PainSc: 4    Body mass index is 36.61 kg/m.     09/29/2022    3:52 PM 11/12/2021    1:49 PM 07/15/2021   12:04 PM 11/01/2020   12:49 AM 10/04/2017    2:58 PM 09/03/2017    4:00 PM  Advanced Directives  Does Patient Have a Medical Advance Directive? Yes No Yes No No No;Yes  Type of Estate agent of [place];Living will  Healthcare Power of Attorney   Living will;Healthcare Power of Attorney  Does patient want to make changes to medical advance directive? No - Patient declined     Yes (Inpatient - patient defers changing a medical advance directive at this time)  Copy of Healthcare Power of Attorney in Chart? No - copy requested  No - copy requested   No - copy requested  Would patient like information on creating a medical advance directive?    No - Patient declined  No - Patient declined    Current Medications (verified) Outpatient Encounter Medications as of 09/29/2022  Medication  Sig   amitriptyline (ELAVIL) 50 MG tablet Take 1 tablet (50 mg total) by mouth at bedtime.   amLODipine (NORVASC) 2.5 MG tablet Take 1 tablet (2.5 mg total) by mouth daily.   Baclofen 5 MG TABS TAKE 1 TABLET BY MOUTH EVERY 8 HOURS AS NEEDED.   Blood Glucose Monitoring Suppl (ONETOUCH VERIO) w/Device KIT Use to check blood sugar 3 times a day and document results, bring to appointments.  Goal is <130 fasting blood sugar and <180 two hours after meals.   Cholecalciferol (VITAMIN D) 50 MCG (2000 UT) CAPS Take by mouth.   Cyanocobalamin (VITAMIN B-12 PO) Take by mouth daily.   escitalopram (LEXAPRO) 10 MG tablet Take 1 tablet (10 mg total) by mouth daily.   FIBER ADULT GUMMIES PO Take 1 tablet by mouth daily.   furosemide (LASIX) 40 MG tablet Take 1 tablet (40 mg total) by mouth daily.   glucose blood (ONETOUCH VERIO) test strip Use as instructed   hydrocortisone (CORTEF) 10 MG tablet daily. 30 mg in the AM and 20 in the PM   Lancets (ONETOUCH ULTRASOFT) lancets Use to check blood sugar 3 times a day and document results, bring to appointments.  Goal is <130 fasting blood sugar and <180 two hours after meals.   losartan (COZAAR) 50 MG tablet Take 1 tablet (50 mg total) by mouth daily.   meclizine (ANTIVERT) 25 MG tablet Take 1 tablet (25 mg total) by mouth 3 (three) times daily as  needed for dizziness.   metFORMIN (GLUCOPHAGE) 1000 MG tablet Take 1 tablet (1,000 mg total) by mouth 2 (two) times daily with a meal.   pregabalin (LYRICA) 150 MG capsule 150 mg in am, 150 MG in afternoon, and 300 mg in pm   rosuvastatin (CRESTOR) 10 MG tablet Take 1 tablet (10 mg total) by mouth daily.   Suvorexant (BELSOMRA) 20 MG TABS Take one tablet by mouth at bedtime   No facility-administered encounter medications on file as of 09/29/2022.    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)    PMS (premenstrual syndrome)    Past Surgical History:  Procedure Laterality Date   BREAST BIOPSY Right    benign   CHOLECYSTECTOMY     COLONOSCOPY  11/08/2018   Danis-polyps   COLONOSCOPY  12/05/2019   Danis   DILATION AND CURETTAGE OF UTERUS     FOOT SURGERY Right 2018   removal of melanoma   LUMBAR FUSION     lymph node removal Right 2018   groin    POLYPECTOMY     TOTAL ABDOMINAL HYSTERECTOMY W/ BILATERAL SALPINGOOPHORECTOMY     TUBAL LIGATION     Family History  Problem Relation Age of Onset   Depression Mother    Anxiety disorder Mother    Bipolar disorder Mother    Diabetes Mother    Mental illness Mother    Heart disease Mother    Stroke Mother    Lung cancer Father    Diabetes Maternal Grandmother    Heart attack Maternal Grandmother    CVA Paternal Grandmother    Colon cancer Neg Hx    Liver cancer Neg Hx    Pancreatic cancer Neg Hx    Esophageal cancer Neg Hx    Stomach cancer Neg Hx    Rectal cancer Neg Hx    Colon polyps Neg Hx    Breast cancer Neg Hx    Social History   Socioeconomic History   Marital status: Widowed    Spouse name: Not on file   Number of children: 1   Years of education: 15   Highest education level: High school graduate  Occupational History   Occupation: Retired  Tobacco Use   Smoking status: Never    Passive exposure: Never   Smokeless tobacco: Never  Vaping Use   Vaping status: Never Used  Substance and Sexual Activity   Alcohol use: Not Currently   Drug use: Never    Sexual activity: Not Currently  Other Topics Concern   Not on file  Social History Narrative   Daughter lives in [place].  Patient lives [address] in [place].    Social Determinants of Health   Financial Resource Strain: Low Risk  (09/29/2022)   Overall Financial Resource Strain (CARDIA)  Difficulty of Paying Living Expenses: Not hard at all  Food Insecurity: No Food Insecurity (09/29/2022)   Hunger Vital Sign    Worried About Running Out of Food in the Last Year: Never true    Ran Out of Food in the Last Year: Never true  Transportation Needs: No Transportation Needs (09/29/2022)   PRAPARE - Administrator, Civil Service (Medical): No    Lack of Transportation (Non-Medical): No  Physical Activity: Insufficiently Active (09/29/2022)   Exercise Vital Sign    Days of Exercise per Week: 3 days    Minutes of Exercise per Session: 40 min  Stress: No Stress Concern Present (09/29/2022)   [employer] of Occupational Health - Occupational Stress Questionnaire    Feeling of Stress : Not at all  Social Connections: Moderately Isolated (09/29/2022)   Social Connection and Isolation Panel [NHANES]    Frequency of Communication with Friends and Family: More than three times a week    Frequency of Social Gatherings with Friends and Family: Three times a week    Attends Religious Services: More than 4 times per year    Active Member of Clubs or Organizations: No    Attends Banker Meetings: Never    Marital Status: Widowed    Tobacco Counseling Counseling given: Not Answered   Clinical Intake:  Pre-visit preparation completed: Yes  Pain : 0-10 Pain Score: 4  Pain Type: Chronic pain Pain Location: Leg Pain Descriptors / Indicators: Aching     BMI - recorded: 36.61 Nutritional Status: BMI > 30  Obese Nutritional Risks: None Diabetes: Yes CBG done?: No (FBS 123 per patient) Did pt. bring in CBG monitor from home?:  No  How often do you need to have someone help you when you read instructions, pamphlets, or other written materials from your doctor or pharmacy?: 1 - Never  Interpreter Needed?: No  Information entered by :: Tora Kindred, CMA   Activities of Daily Living    09/29/2022    3:44 PM  In your present state of health, do you have any difficulty performing the following activities:  Hearing? 0  Vision? 0  Difficulty concentrating or making decisions? 0  Walking or climbing stairs? 0  Dressing or bathing? 0  Doing errands, shopping? 0  Preparing Food and eating ? N  Using the Toilet? N  In the past six months, have you accidently leaked urine? N  Do you have problems with loss of bowel control? N  Managing your Medications? N  Managing your Finances? N  Housekeeping or managing your Housekeeping? N    Patient Care Team: Marjie Skiff, NP as PCP - General (Nurse Practitioner) [facility] [place] (Optometry)  Indicate any recent Medical Services you may have received from other than [facility] providers in the past year (date may be approximate).     Assessment:   This is a routine wellness examination for Tamryn.  Hearing/Vision screen Hearing Screening - Comments:: Denies hearing loss Vision Screening - Comments:: Gets routine eye exams   Goals Addressed               This Visit's Progress     Weight (lb) < 170 lb (77.1 kg) (pt-stated)   220 lb (99.8 kg)     Depression Screen    09/29/2022    3:50 PM 09/23/2022   10:40 AM 06/17/2022    2:44 PM 04/28/2022    3:33 PM 02/04/2022    9:52  AM 11/10/2021    1:14 PM 07/15/2021   12:13 PM  PHQ 2/9 Scores  PHQ - 2 Score 0 0 0 1 0 2 0  PHQ- 9 Score 0 0 1 2 4 5      Fall Risk    09/29/2022    3:52 PM 09/23/2022   10:40 AM 06/17/2022    2:44 PM 04/28/2022    3:33 PM 03/17/2022    2:32 PM  Fall Risk   Falls in the past year? 1 0 0 1 1  Number falls in past yr: 0 0 0 1 0  Injury with Fall? 0 0 0 0 0  Risk  for fall due to : History of fall(s) No Fall Risks No Fall Risks History of fall(s) No Fall Risks  Follow up Falls prevention discussed;Falls evaluation completed;Education provided Falls evaluation completed Falls evaluation completed Falls evaluation completed Falls evaluation completed    MEDICARE RISK AT HOME: Medicare Risk at Home Any stairs in or around the home?: Yes If so, are there any without handrails?: No Home free of loose throw rugs in walkways, pet beds, electrical cords, etc?: Yes Adequate lighting in your home to reduce risk of falls?: Yes Life alert?: No Use of a cane, walker or w/c?: No Grab bars in the bathroom?: Yes Shower chair or bench in shower?: Yes Elevated toilet seat or a handicapped toilet?: Yes  TIMED UP AND GO:  Was the test performed?  No    Cognitive Function:        09/29/2022    3:56 PM 07/15/2021   12:06 PM  6CIT Screen  What Year? 0 points 0 points  What month? 0 points 0 points  What time? 0 points 0 points  Count back from 20 0 points 0 points  Months in reverse 0 points 0 points  Repeat phrase 0 points 0 points  Total Score 0 points 0 points    Immunizations Immunization History  Administered Date(s) Administered   Fluad Quad(high Dose 65+) 02/04/2021, 11/10/2021   Fluad Trivalent(High Dose 65+) 09/23/2022   Influenza Split 12/19/2010, 12/25/2011   Influenza,inj,Quad PF,6+ Mos 09/20/2014, 12/02/2015, 01/16/2019   Influenza-Unspecified 08/19/2017   PFIZER(Purple Top)SARS-COV-2 Vaccination 04/14/2019, 05/09/2019, 12/23/2019   Pneumococcal Conjugate-13 05/24/2015   Pneumococcal Polysaccharide-23 07/19/2009, 02/04/2021   Tdap 05/24/1992, 02/16/2013   Zoster, Live 02/28/2013    TDAP status: Up to date  Flu Vaccine status: Up to date  Pneumococcal vaccine status: Up to date  Covid-19 vaccine status: Declined, Education has been provided regarding the importance of this vaccine but patient still declined. Advised may receive  this vaccine at local pharmacy or Health Dept.or vaccine clinic. Aware to provide a copy of the vaccination record if obtained from local pharmacy or Health Dept. Verbalized acceptance and understanding.  Qualifies for Shingles Vaccine? Yes   Zostavax completed Yes   Shingrix Completed?: No.    Education has been provided regarding the importance of this vaccine. Patient has been advised to call insurance company to determine out of pocket expense if they have not yet received this vaccine. Advised may also receive vaccine at local pharmacy or Health Dept. Verbalized acceptance and understanding.  Screening Tests Health Maintenance  Topic Date Due   OPHTHALMOLOGY EXAM  04/19/2021   COVID-19 Vaccine (4 - 2023-24 season) 10/09/2022 (Originally 09/20/2022)   Zoster Vaccines- Shingrix (1 of 2) 12/23/2022 (Originally 07/30/1969)   DTaP/Tdap/Td (3 - Td or Tdap) 02/17/2023   Diabetic kidney evaluation - Urine ACR  03/20/2023  HEMOGLOBIN A1C  03/23/2023   FOOT EXAM  04/28/2023   Diabetic kidney evaluation - eGFR measurement  09/23/2023   Medicare Annual Wellness (AWV)  09/29/2023   MAMMOGRAM  10/03/2023   Colonoscopy  03/19/2024   DEXA SCAN  10/03/2026   Pneumonia Vaccine 51+ Years old  Completed   INFLUENZA VACCINE  Completed   Hepatitis C Screening  Completed   HPV VACCINES  Aged Out    Health Maintenance  Health Maintenance Due  Topic Date Due   OPHTHALMOLOGY EXAM  04/19/2021    Colorectal cancer screening: Type of screening: Colonoscopy. Completed 03/19/21. Repeat every 3 years  Mammogram status: Completed 10/02/21. Repeat every year Has appointment 10/05/22  Bone Density status: Completed 10/02/21. Results reflect: Bone density results: OSTEOPENIA. Repeat every 5 years.  Lung Cancer Screening: (Low Dose CT Chest recommended if Age 63-80 years, 20 pack-year currently smoking OR have quit w/in 15years.) does not qualify.   Lung Cancer Screening Referral: n/a  Additional  Screening:  Hepatitis C Screening: does not qualify; Completed 04/25/21  Vision Screening: Recommended annual ophthalmology exams for early detection of glaucoma and other disorders of the eye. Is the patient up to date with their annual eye exam?  Yes  Who is the provider or what is the name of the office in which the patient attends annual eye exams? [facility], [place], [place] If pt is not established with a provider, would they like to be referred to a provider to establish care? No .   Dental Screening: Recommended annual dental exams for proper oral hygiene  Diabetic Foot Exam: Diabetic Foot Exam: Completed 04/28/22  Community Resource Referral / Chronic Care Management: CRR required this visit?  No   CCM required this visit?  No     Plan:     I have personally reviewed and noted the following in the patient's chart:   Medical and social history Use of alcohol, tobacco or illicit drugs  Current medications and supplements including opioid prescriptions. Patient is not currently taking opioid prescriptions. Functional ability and status Nutritional status Physical activity Advanced directives List of other physicians Hospitalizations, surgeries, and ER visits in previous 12 months Vitals Screenings to include cognitive, depression, and falls Referrals and appointments  In addition, I have reviewed and discussed with patient certain preventive protocols, quality metrics, and best practice recommendations. A written personalized care plan for preventive services as well as general preventive health recommendations were provided to patient.     Tora Kindred, CMA   09/29/2022   After Visit Summary: (MyChart) Due to this being a telephonic visit, the after visit summary with patients personalized plan was offered to patient via MyChart   Nurse Notes:  Declined referral to DM & Nutrition education. Plans to get shingles vaccine at local pharmacy. Declined covid  vaccine. Requested records of last eye exam from [facility] [place] [place] [facility] scheduled 10/05/22. Had flu shot 09/23/22.

## 2022-09-29 NOTE — Patient Instructions (Addendum)
Sarah [name] , Thank you for taking time to come for your Medicare Wellness Visit. I appreciate your ongoing commitment to your health goals. Please review the following plan we discussed and let me know if I can assist you in the future.   Referrals/Orders/Follow-Ups/Clinician Recommendations: Get the shingles vaccines at your local pharmacy at your earliest convenience. Please bring documentation of those vaccines to the office so that we can update your chart.  This is a list of the screening recommended for you and due dates:  Health Maintenance  Topic Date Due   Eye exam for diabetics  04/19/2021   COVID-19 Vaccine (4 - 2023-24 season) 10/09/2022*   Zoster (Shingles) Vaccine (1 of 2) 12/23/2022*   DTaP/Tdap/Td vaccine (3 - Td or Tdap) 02/17/2023   Yearly kidney health urinalysis for diabetes  03/20/2023   Hemoglobin A1C  03/23/2023   Complete foot exam   04/28/2023   Yearly kidney function blood test for diabetes  09/23/2023   Medicare Annual Wellness Visit  09/29/2023   Mammogram  10/03/2023   Colon Cancer Screening  03/19/2024   DEXA scan (bone density measurement)  10/03/2026   Pneumonia Vaccine  Completed   Flu Shot  Completed   Hepatitis C Screening  Completed   HPV Vaccine  Aged Out  *Topic was postponed. The date shown is not the original due date.    Advanced directives: (Copy Requested) Please bring a copy of your health care power of attorney and living will to the office to be added to your chart at your convenience.  Next Medicare Annual Wellness Visit scheduled for next year: Yes, 10/05/23 @ 3:00pm  Fall Prevention in the Home, Adult Falls can cause injuries and affect people of all ages. There are many simple things that you can do to make your home safe and to help prevent falls. If you need it, ask for help making these changes. What actions can I take to prevent falls? General information Use good lighting in all rooms. Make sure to: Replace any light bulbs  that burn out. Turn on lights if it is dark and use night-lights. Keep items that you use often in easy-to-reach places. Lower the shelves around your home if needed. Move furniture so that there are clear paths around it. Do not keep throw rugs or other things on the floor that can make you trip. If any of your floors are uneven, fix them. Add color or contrast paint or tape to clearly mark and help you see: Grab bars or handrails. First and last steps of staircases. Where the edge of each step is. If you use a ladder or stepladder: Make sure that it is fully opened. Do not climb a closed ladder. Make sure the sides of the ladder are locked in place. Have someone hold the ladder while you use it. Know where your pets are as you move through your home. What can I do in the bathroom?     Keep the floor dry. Clean up any water that is on the floor right away. Remove soap buildup in the bathtub or shower. Buildup makes bathtubs and showers slippery. Use non-skid mats or decals on the floor of the bathtub or shower. Attach bath mats securely with double-sided, non-slip rug tape. If you need to sit down while you are in the shower, use a non-slip stool. Install grab bars by the toilet and in the bathtub and shower. Do not use towel bars as grab bars. What can I  do in the bedroom? Make sure that you have a light by your bed that is easy to reach. Do not use any sheets or blankets on your bed that hang to the floor. Have a firm bench or chair with side arms that you can use for support when you get dressed. What can I do in the kitchen? Clean up any spills right away. If you need to reach something above you, use a sturdy step stool that has a grab bar. Keep electrical cables out of the way. Do not use floor polish or wax that makes floors slippery. What can I do with my stairs? Do not leave anything on the stairs. Make sure that you have a light switch at the top and the bottom of the  stairs. Have them installed if you do not have them. Make sure that there are handrails on both sides of the stairs. Fix handrails that are broken or loose. Make sure that handrails are as long as the staircases. Install non-slip stair treads on all stairs in your home if they do not have carpet. Avoid having throw rugs at the top or bottom of stairs, or secure the rugs with carpet tape to prevent them from moving. Choose a carpet design that does not hide the edge of steps on the stairs. Make sure that carpet is firmly attached to the stairs. Fix any carpet that is loose or worn. What can I do on the outside of my home? Use bright outdoor lighting. Repair the edges of walkways and driveways and fix any cracks. Clear paths of anything that can make you trip, such as tools or rocks. Add color or contrast paint or tape to clearly mark and help you see high doorway thresholds. Trim any bushes or trees on the main path into your home. Check that handrails are securely fastened and in good repair. Both sides of all steps should have handrails. Install guardrails along the edges of any raised decks or porches. Have leaves, snow, and ice cleared regularly. Use sand, salt, or ice melt on walkways during winter months if you live where there is ice and snow. In the garage, clean up any spills right away, including grease or oil spills. What other actions can I take? Review your medicines with your health care provider. Some medicines can make you confused or feel dizzy. This can increase your chance of falling. Wear closed-toe shoes that fit well and support your feet. Wear shoes that have rubber soles and low heels. Use a cane, walker, scooter, or crutches that help you move around if needed. Talk with your provider about other ways that you can decrease your risk of falls. This may include seeing a physical therapist to learn to do exercises to improve movement and strength. Where to find more  information Centers for Disease Control and Prevention, STEADI: TonerPromos.no [employer] on Aging: BaseRingTones.pl National Institute on Aging: BaseRingTones.pl Contact a health care provider if: You are afraid of falling at home. You feel weak, drowsy, or dizzy at home. You fall at home. Get help right away if you: Lose consciousness or have trouble moving after a fall. Have a fall that causes a head injury. These symptoms may be an emergency. Get help right away. Call 911. Do not wait to see if the symptoms will go away. Do not drive yourself to the hospital. This information is not intended to replace advice given to you by your health care provider. Make sure you discuss any  questions you have with your health care provider. Document Revised: 09/08/2021 Document Reviewed: 09/08/2021 Elsevier Patient Education  2024 [employer].

## 2022-10-12 ENCOUNTER — Ambulatory Visit
Admission: RE | Admit: 2022-10-12 | Discharge: 2022-10-12 | Disposition: A | Discharge status: Home / Self Care | Payer: Medicare Other | Source: Ambulatory Visit | Attending: Nurse Practitioner | Admitting: Nurse Practitioner

## 2022-10-12 DIAGNOSIS — Z1231 Encounter for screening mammogram for malignant neoplasm of breast: Secondary | ICD-10-CM | POA: Diagnosis present

## 2022-10-14 NOTE — Progress Notes (Signed)
Contacted via MyChart   Normal mammogram, may repeat in one year:)

## 2022-11-11 ENCOUNTER — Other Ambulatory Visit: Payer: Self-pay | Admitting: Nurse Practitioner

## 2022-11-13 NOTE — Telephone Encounter (Signed)
Requested Prescriptions  Pending Prescriptions Disp Refills   Baclofen 5 MG TABS [Pharmacy Med Name: BACLOFEN 5 MG TABLET] 90 tablet 0    Sig: TAKE 1 TABLET BY MOUTH EVERY 8 HOURS AS NEEDED     Analgesics:  Muscle Relaxants - baclofen Failed - 11/11/2022  6:30 PM      Failed - Cr in normal range and within 180 days    Creat  Date Value Ref Range Status  04/23/2020 0.89 0.50 - 0.99 mg/dL Final    Comment:    For patients >71 years of age, the reference limit for Creatinine is approximately 13% higher for people identified as African-American. .    Creatinine, Ser  Date Value Ref Range Status  09/23/2022 1.22 (H) 0.57 - 1.00 mg/dL Final   Creatinine, Urine  Date Value Ref Range Status  01/10/2018 14 (L) 20 - 275 mg/dL Final         Passed - eGFR is 30 or above and within 180 days    GFR, Est African American  Date Value Ref Range Status  04/23/2020 77 > OR = 60 mL/min/1.67m2 Final   GFR, Est Non African American  Date Value Ref Range Status  04/23/2020 67 > OR = 60 mL/min/1.51m2 Final   GFR, Estimated  Date Value Ref Range Status  11/12/2021 59 (L) >60 mL/min Final    Comment:    (NOTE) Calculated using the CKD-EPI Creatinine Equation (2021)    eGFR  Date Value Ref Range Status  09/23/2022 47 (L) >59 mL/min/1.73 Final         Passed - Valid encounter within last 6 months    Recent Outpatient Visits           1 month ago Type 2 diabetes mellitus with obesity (HCC)   [place] [facility] [place], Ramsay T, NP   4 months ago Type 2 diabetes mellitus with obesity (HCC)   [place] [facility] [place], [place] T, NP   6 months ago Type 2 diabetes mellitus with obesity (HCC)   [place] [facility] [place], Garrison T, NP   8 months ago Type 2 diabetes mellitus with obesity (HCC)   [place] [facility] [place], Corrie Dandy T, NP   9 months ago Generalized abdominal pain   [place] [facility]  [place], Dorie Rank, NP       Future Appointments             In 1 month [place], Dorie Rank, NP  [facility], [facility]   In 7 months Deirdre Evener, MD [facility]

## 2022-12-04 ENCOUNTER — Other Ambulatory Visit: Payer: Self-pay | Admitting: Nurse Practitioner

## 2022-12-07 NOTE — Telephone Encounter (Signed)
Requested Prescriptions  Refused Prescriptions Disp Refills   metFORMIN (GLUCOPHAGE) 500 MG tablet [Pharmacy Med Name: METFORMIN HCL 500 MG TABLET] 360 tablet 2    Sig: START OUT TAKING ONE TABLET (500 MG) BY MOUTH TWICE A DAY, IF IN ONE WEEK TOLERATING INCREASE TO 1000 MG (2 TABLETS) IN MORNING AND 500 MG (1 TABLET) IN EVENING BY MOUTH, THEN IF TOLERATING THIS AFTER ONE WEEK INCREASE TO 1000 MG (2 TABLETS) TWICE A DAY BY MOUTH.     Endocrinology:  Diabetes - Biguanides Failed - 12/04/2022 10:09 PM      Failed - Cr in normal range and within 360 days    Creat  Date Value Ref Range Status  04/23/2020 0.89 0.50 - 0.99 mg/dL Final    Comment:    For patients >31 years of age, the reference limit for Creatinine is approximately 13% higher for people identified as African-American. .    Creatinine, Ser  Date Value Ref Range Status  09/23/2022 1.22 (H) 0.57 - 1.00 mg/dL Final   Creatinine, Urine  Date Value Ref Range Status  01/10/2018 14 (L) 20 - 275 mg/dL Final         Failed - eGFR in normal range and within 360 days    GFR, Est African American  Date Value Ref Range Status  04/23/2020 77 > OR = 60 mL/min/1.43m2 Final   GFR, Est Non African American  Date Value Ref Range Status  04/23/2020 67 > OR = 60 mL/min/1.87m2 Final   GFR, Estimated  Date Value Ref Range Status  11/12/2021 59 (L) >60 mL/min Final    Comment:    (NOTE) Calculated using the CKD-EPI Creatinine Equation (2021)    eGFR  Date Value Ref Range Status  09/23/2022 47 (L) >59 mL/min/1.73 Final         Failed - B12 Level in normal range and within 720 days    Vitamin B-12  Date Value Ref Range Status  03/17/2022 1,810 (H) 232 - 1,245 pg/mL Final         Failed - CBC within normal limits and completed in the last 12 months    WBC  Date Value Ref Range Status  11/12/2021 7.8 4.0 - 10.5 K/uL Final   RBC  Date Value Ref Range Status  11/12/2021 3.89 3.87 - 5.11 MIL/uL Final   Hemoglobin  Date Value  Ref Range Status  11/12/2021 11.2 (L) 12.0 - 15.0 g/dL Final  62/13/0865 78.4 11.1 - 15.9 g/dL Final   HCT  Date Value Ref Range Status  11/12/2021 33.3 (L) 36.0 - 46.0 % Final   Hematocrit  Date Value Ref Range Status  02/04/2021 38.9 34.0 - 46.6 % Final   MCHC  Date Value Ref Range Status  11/12/2021 33.6 30.0 - 36.0 g/dL Final   [facility]  Date Value Ref Range Status  11/12/2021 28.8 26.0 - 34.0 pg Final   MCV  Date Value Ref Range Status  11/12/2021 85.6 80.0 - 100.0 fL Final  02/04/2021 88 79 - 97 fL Final  06/04/2011 88 80 - 100 fL Final   No results found for: "PLTCOUNTKUC", "LABPLAT", "POCPLA" RDW  Date Value Ref Range Status  11/12/2021 12.5 11.5 - 15.5 % Final  02/04/2021 12.6 11.7 - 15.4 % Final  06/04/2011 13.2 11.5 - 14.5 % Final         Passed - HBA1C is between 0 and 7.9 and within 180 days    Hemoglobin A1C  Date Value Ref Range  Status  06/04/2011 6.3 4.2 - 6.3 % Final    Comment:    The American Diabetes Association recommends that a primary goal of therapy should be <7% and that physicians should reevaluate the treatment regimen in patients with HbA1c values consistently >8%.    HB A1C (BAYER DCA - WAIVED)  Date Value Ref Range Status  09/23/2022 6.9 (H) 4.8 - 5.6 % Final    Comment:             Prediabetes: 5.7 - 6.4          Diabetes: >6.4          Glycemic control for adults with diabetes: <7.0          Passed - Valid encounter within last 6 months    Recent Outpatient Visits           2 months ago Type 2 diabetes mellitus with obesity (HCC)   [place] [facility] [place], Crane T, NP   5 months ago Type 2 diabetes mellitus with obesity (HCC)   [place] [facility] [place], [place] T, NP   7 months ago Type 2 diabetes mellitus with obesity (HCC)   [place] [facility] [place], [place] T, NP   8 months ago Type 2 diabetes mellitus with obesity (HCC)   [place] [facility]  [place], Corrie Dandy T, NP   10 months ago Generalized abdominal pain   [place] [facility] [place], Dorie Rank, NP       Future Appointments             In 2 weeks Marjie Skiff, NP [facility] [facility], [facility]   In 6 months Deirdre Evener, MD [facility]

## 2022-12-10 ENCOUNTER — Other Ambulatory Visit: Payer: Self-pay | Admitting: Behavioral Health

## 2022-12-10 DIAGNOSIS — F5105 Insomnia due to other mental disorder: Secondary | ICD-10-CM

## 2022-12-19 NOTE — Patient Instructions (Incomplete)
Be Involved in Caring For Your Health:  Taking Medications When medications are taken as directed, they can greatly improve your health. But if they are not taken as prescribed, they may not work. In some cases, not taking them correctly can be harmful. To help ensure your treatment remains effective and safe, understand your medications and how to take them. Bring your medications to each visit for review by your provider.  Your lab results, notes, and after visit summary will be available on My Chart. We strongly encourage you to use this feature. If lab results are abnormal the clinic will contact you with the appropriate steps. If the clinic does not contact you assume the results are satisfactory. You can always view your results on My Chart. If you have questions regarding your health or results, please contact the clinic during office hours. You can also ask questions on My Chart.  We at [facility] are grateful that you chose [place] to provide your care. We strive to provide evidence-based and compassionate care and are always looking for feedback. If you get a survey from the clinic please complete this so we can hear your opinions.  Diabetes Mellitus and Foot Care Diabetes, also called diabetes mellitus, may cause problems with your feet and legs because of poor blood flow (circulation). Poor circulation may make your skin: Become thinner and drier. Break more easily. Heal more slowly. Peel and crack. You may also have nerve damage (neuropathy). This can cause decreased feeling in your legs and feet. This means that you may not notice minor injuries to your feet that could lead to more serious problems. Finding and treating problems early is the best way to prevent future foot problems. How to care for your feet Foot hygiene  Wash your feet daily with warm water and mild soap. Do not use hot water. Then, pat your feet and the areas between your toes until they are fully dry. Do  not soak your feet. This can dry your skin. Trim your toenails straight across. Do not dig under them or around the cuticle. File the edges of your nails with an emery board or nail file. Apply a moisturizing lotion or petroleum jelly to the skin on your feet and to dry, brittle toenails. Use lotion that does not contain alcohol and is unscented. Do not apply lotion between your toes. Shoes and socks Wear clean socks or stockings every day. Make sure they are not too tight. Do not wear knee-high stockings. These may decrease blood flow to your legs. Wear shoes that fit well and have enough cushioning. Always look in your shoes before you put them on to be sure there are no objects inside. To break in new shoes, wear them for just a few hours a day. This prevents injuries on your feet. Wounds, scrapes, corns, and calluses  Check your feet daily for blisters, cuts, bruises, sores, and redness. If you cannot see the bottom of your feet, use a mirror or ask someone for help. Do not cut off corns or calluses or try to remove them with medicine. If you find a minor scrape, cut, or break in the skin on your feet, keep it and the skin around it clean and dry. You may clean these areas with mild soap and water. Do not clean the area with peroxide, alcohol, or iodine. If you have a wound, scrape, corn, or callus on your foot, look at it several times a day to make sure it  is healing and not infected. Check for: Redness, swelling, or pain. Fluid or blood. Warmth. Pus or a bad smell. General tips Do not cross your legs. This may decrease blood flow to your feet. Do not use heating pads or hot water bottles on your feet. They may burn your skin. If you have lost feeling in your feet or legs, you may not know this is happening until it is too late. Protect your feet from hot and cold by wearing shoes, such as at the beach or on hot pavement. Schedule a complete foot exam at least once a year or more often if  you have foot problems. Report any cuts, sores, or bruises to your health care provider right away. Where to find more information American Diabetes Association: diabetes.org Association of Diabetes Care & Education Specialists: diabeteseducator.org Contact a health care provider if: You have a condition that increases your risk of infection, and you have any cuts, sores, or bruises on your feet. You have an injury that is not healing. You have redness on your legs or feet. You feel burning or tingling in your legs or feet. You have pain or cramps in your legs and feet. Your legs or feet are numb. Your feet always feel cold. You have pain around any toenails. Get help right away if: You have a wound, scrape, corn, or callus on your foot and: You have signs of infection. You have a fever. You have a red line going up your leg. This information is not intended to replace advice given to you by your health care provider. Make sure you discuss any questions you have with your health care provider. Document Revised: 07/09/2021 Document Reviewed: 07/09/2021 Elsevier Patient Education  2024 [employer].

## 2022-12-21 ENCOUNTER — Telehealth: Payer: Self-pay | Admitting: Behavioral Health

## 2022-12-21 NOTE — Telephone Encounter (Addendum)
Patient reporting difficulty in getting Belsomra, is on backorder. Asked if she had tried other pharmacies and she said the only one that had in stock was an hour away. She said Ambien worked for her previously but it was stopped because Medicare doesn't like to cover it for someone over 65. She is asking if she can try Ambien again or another medication.  It will require a PA, but was previously approved in 2023. Will pend if appropriate.

## 2022-12-21 NOTE — Telephone Encounter (Signed)
Pt reports it has been difficult to get Belsomra over last few times. Has been without it now a week b/c [facility] pharmacy [facility] has it on back [facility]. She would like to try a different sleep med due to difficulty getting belsomra. She has tried Ambien 5mg  in past and it worked.  [id number] [place] Rough, [facility] [address]  [address] [place] [address]

## 2022-12-22 NOTE — Telephone Encounter (Signed)
Tried calling patient, no answer and no way to LVM. Will continue to try to reach her.

## 2022-12-23 ENCOUNTER — Ambulatory Visit: Payer: Medicare Other | Admitting: Nurse Practitioner

## 2022-12-23 ENCOUNTER — Other Ambulatory Visit: Payer: Self-pay

## 2022-12-23 DIAGNOSIS — E559 Vitamin D deficiency, unspecified: Secondary | ICD-10-CM

## 2022-12-23 DIAGNOSIS — G6289 Other specified polyneuropathies: Secondary | ICD-10-CM

## 2022-12-23 DIAGNOSIS — M85851 Other specified disorders of bone density and structure, right thigh: Secondary | ICD-10-CM

## 2022-12-23 DIAGNOSIS — F5105 Insomnia due to other mental disorder: Secondary | ICD-10-CM

## 2022-12-23 DIAGNOSIS — I152 Hypertension secondary to endocrine disorders: Secondary | ICD-10-CM

## 2022-12-23 DIAGNOSIS — E1169 Type 2 diabetes mellitus with other specified complication: Secondary | ICD-10-CM

## 2022-12-23 DIAGNOSIS — F3341 Major depressive disorder, recurrent, in partial remission: Secondary | ICD-10-CM

## 2022-12-23 DIAGNOSIS — R9431 Abnormal electrocardiogram [ECG] [EKG]: Secondary | ICD-10-CM

## 2022-12-23 DIAGNOSIS — E271 Primary adrenocortical insufficiency: Secondary | ICD-10-CM

## 2022-12-23 MED ORDER — ZOLPIDEM TARTRATE 5 MG PO TABS: 5.0000 mg | ORAL_TABLET | Freq: Every evening | ORAL | 0 refills | Status: DC | PRN

## 2022-12-23 NOTE — Telephone Encounter (Signed)
Pended Rx for [facility]pidem [facility] to [facility]. Deleted Belsomra off med list.

## 2022-12-23 NOTE — Telephone Encounter (Signed)
Was able to reach patient and she would like Rx for 5 mg Ambien sent. Aware that it may require a PA, as it has previously.

## 2022-12-27 NOTE — Patient Instructions (Signed)

## 2022-12-30 ENCOUNTER — Ambulatory Visit (INDEPENDENT_AMBULATORY_CARE_PROVIDER_SITE_OTHER): Payer: Medicare Other | Admitting: Nurse Practitioner

## 2022-12-30 ENCOUNTER — Encounter: Payer: Self-pay | Admitting: Nurse Practitioner

## 2022-12-30 VITALS — BP 112/68 | HR 77 | Temp 97.9°F | Ht 64.5 in | Wt 233.2 lb

## 2022-12-30 DIAGNOSIS — E66812 Obesity, class 2: Secondary | ICD-10-CM

## 2022-12-30 DIAGNOSIS — E1159 Type 2 diabetes mellitus with other circulatory complications: Secondary | ICD-10-CM

## 2022-12-30 DIAGNOSIS — F5105 Insomnia due to other mental disorder: Secondary | ICD-10-CM

## 2022-12-30 DIAGNOSIS — F3341 Major depressive disorder, recurrent, in partial remission: Secondary | ICD-10-CM | POA: Diagnosis not present

## 2022-12-30 DIAGNOSIS — E1169 Type 2 diabetes mellitus with other specified complication: Secondary | ICD-10-CM

## 2022-12-30 DIAGNOSIS — F99 Mental disorder, not otherwise specified: Secondary | ICD-10-CM

## 2022-12-30 DIAGNOSIS — I152 Hypertension secondary to endocrine disorders: Secondary | ICD-10-CM

## 2022-12-30 DIAGNOSIS — G6289 Other specified polyneuropathies: Secondary | ICD-10-CM

## 2022-12-30 DIAGNOSIS — E669 Obesity, unspecified: Secondary | ICD-10-CM | POA: Diagnosis not present

## 2022-12-30 DIAGNOSIS — E271 Primary adrenocortical insufficiency: Secondary | ICD-10-CM

## 2022-12-30 DIAGNOSIS — E785 Hyperlipidemia, unspecified: Secondary | ICD-10-CM

## 2022-12-30 LAB — BAYER DCA HB A1C WAIVED: HB A1C (BAYER DCA - WAIVED): 6.6 % — ABNORMAL HIGH (ref 4.8–5.6)

## 2022-12-30 NOTE — Assessment & Plan Note (Signed)
Chronic, ongoing.  Followed by endocrinology, continue current regimen as ordered by them.  Recent notes reviewed and discussed medication changes with patient, she is tolerating the changes.

## 2022-12-30 NOTE — Assessment & Plan Note (Signed)
Chronic, ongoing.  Followed by psychiatry who fills Belsomra (Ambien currently due to no supply of Belsomra available), continue current medication regimen as prescribed by them.

## 2022-12-30 NOTE — Assessment & Plan Note (Signed)
Chronic, stable.  Denies SI/HI.  Followed by psychiatry -- recent notes reviewed and will maintain current medication regimen as prescribed by them. 

## 2022-12-30 NOTE — Assessment & Plan Note (Signed)
Chronic, ongoing with A1c 6.6% today, urine ALB 29 March 2022.  Praised for this.  Taking Metformin 1000 MG BID and tolerating, continue this.  Recommend check BS fasting every morning and document for visits.  Will plan on recheck of A1c in 3 months.  Recommend heavy focus on diabetic diet at home and regular exercise. - Eye exam up to date. Will get records. - Foot exam up to date - ARB and Statin on board - Pneumococcal and Flu vaccines up to date.

## 2022-12-30 NOTE — Assessment & Plan Note (Signed)
Chronic, ongoing.  Continue Crestor and adjust as needed.  CMP and Lipid check today.

## 2022-12-30 NOTE — Assessment & Plan Note (Signed)
Followed by neurology, recent notes reviewed.  Continue current medication regimen as prescribed by them. She is utilizing gym at [facility] on schedule.

## 2022-12-30 NOTE — Assessment & Plan Note (Signed)
Chronic, ongoing.  BP at goal today for age. Continue Losartan 50 MG daily and Lasix 40 MG daily +  Amlodipine at 2.5 MG (suspect more ISH present). Recommend she monitor BP at least a few mornings a week at home and document.  DASH diet at home.  Refills as needed.  Losartan for kidney protection.  LABS: CMP.  Return in 3 months.

## 2022-12-30 NOTE — Assessment & Plan Note (Signed)
BMI 39.41.  Recommended eating smaller high protein, low fat meals more frequently and exercising 30 mins a day 5 times a week with a goal of 10-15lb weight loss in the next 3 months. Patient voiced their understanding and motivation to adhere to these recommendations.

## 2022-12-30 NOTE — Progress Notes (Signed)
BP 112/68 (BP Location: Left Arm, Cuff Size: Large)   Pulse 77   Temp 97.9 F (36.6 C) (Oral)   Ht 5' 4.5" (1.638 m)   Wt 233 lb 3.2 oz (105.8 kg)   SpO2 98%   BMI 39.41 kg/m    Subjective:    Patient ID: Sarah [name], female    DOB: [birth year], 72 y.o.   MRN: [id number]  HPI: Sarah [name] is a 72 y.o. female  Chief Complaint  Patient presents with   Chronic Kidney Disease   Depression   Diabetes   Hyperlipidemia   Hypertension   DIABETES 6.9% A1c September.  Taking Metformin 1000 MG BID.  She is exercising 3 times a week at [facility].  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   Hypoglycemic episodes:no Polydipsia/polyuria: no Visual disturbance: no Chest pain: no Paresthesias: no Glucose Monitoring: yes  Accucheck frequency: Daily  Fasting glucose: average 130 in morning  Post prandial:  Evening: had one 92 -- 92 to 115  Before meals: Taking Insulin?: no  Long acting insulin:  Short acting insulin: Blood Pressure Monitoring: not checking Retinal Examination: Up to Date -- March 2024 [facility] Foot Exam: Up to Date Diabetic Education: Not Completed Pneumovax: Up to Date Influenza: Up to Date Aspirin: no   HYPERTENSION / HYPERLIPIDEMIA Taking Losartan, Amlodipine, and Rosuvastatin + Lasix.  Satisfied with current treatment? yes Duration of hypertension: years BP monitoring frequency: daily BP range: average <130/80 BP medication side effects: no Duration of hyperlipidemia: years Cholesterol medication side effects: no Cholesterol supplements: none Medication compliance: good compliance Aspirin: no Recent stressors: yes Recurrent headaches: no Visual changes: no Palpitations: no Dyspnea: no Chest pain: no Lower extremity edema: yes - lymphedema in her right leg from melanoma surgery, wears  compression stocking Dizzy/lightheaded: no   HISTORY OF MELANOMA Follows with [facility] every 6 months for history malignant melanoma in 2018 -- last visit on 04/09/21 -- is scheduled next May 2025.  Oncology 08/25/21, is to return to them as needed. Has visits with neurology for neuropathy after treatments. Last seen 12/31/21, continues Pregabalin and to have yearly visits. History of skin cancer: yes History of precancerous skin lesions: yes Family history of skin cancer: no   DEPRESSION Continues medications as ordered by psychiatry with last visit on 09/28/22. Continues Amitriptyline, Lexapro, Ambien. Mood status: stable Satisfied with current treatment?: yes Symptom severity: moderate  Duration of current treatment : chronic Side effects: no Medication compliance: good compliance Psychotherapy/counseling: yes in the past Depressed mood: no Anxious mood: occasional Anhedonia: no Significant weight loss or gain: no Insomnia: no Fatigue: sometimes Feelings of worthlessness or guilt: no Impaired concentration/indecisiveness: no Suicidal ideations: no Hopelessness: no Crying spells: no    12/30/2022    1:21 PM 09/29/2022    3:50 PM 09/23/2022   10:40 AM 06/17/2022    2:44 PM 04/28/2022    3:33 PM  Depression screen PHQ 2/9  Decreased Interest 0 0 0 0 0  Down, Depressed, Hopeless 0 0 0 0 1  PHQ - 2 Score 0 0 0 0 1  Altered sleeping 0 0 0 0 0  Tired, decreased energy 0 0 0 1 1  Change in appetite 0 0 0 0 0  Feeling bad or failure about yourself  0 0 0 0 0  Trouble concentrating 0 0 0 0  0  Moving slowly or fidgety/restless 0 0 0 0 0  Suicidal thoughts 0 0 0 0 0  PHQ-9 Score 0 0 0 1 2  Difficult doing work/chores Not difficult at all Not difficult at all Not difficult at all Not difficult at all Not difficult at all       12/30/2022    1:23 PM 09/23/2022   10:40 AM 06/17/2022    2:44 PM 04/28/2022    3:33 PM  GAD 7 : Generalized Anxiety Score  Nervous, Anxious, on Edge 1 0  0 0  Control/stop worrying 0 0 0 0  Worry too much - different things 0 0 0 0  Trouble relaxing 0 0 0 0  Restless 0 0 0 0  Easily annoyed or irritable 0 0 0 0  Afraid - awful might happen 0 0 0 0  Total GAD 7 Score 1 0 0 0  Anxiety Difficulty Not difficult at all Not difficult at all Not difficult at all Not difficult at all   Relevant past medical, surgical, family and social history reviewed and updated as indicated. Interim medical history since our last visit reviewed. Allergies and medications reviewed and updated.  Review of Systems  Constitutional:  Negative for activity change, appetite change, diaphoresis, fatigue and fever.  Respiratory:  Negative for cough, chest tightness and shortness of breath.   Cardiovascular:  Positive for leg swelling (at baseline, wears compression). Negative for chest pain and palpitations.  Gastrointestinal: Negative.   Endocrine: Negative for polydipsia, polyphagia and polyuria.  Neurological:  Negative for dizziness, syncope, weakness, light-headedness, numbness and headaches.  Psychiatric/Behavioral: Negative.     Per HPI unless specifically indicated above     Objective:    BP 112/68 (BP Location: Left Arm, Cuff Size: Large)   Pulse 77   Temp 97.9 F (36.6 C) (Oral)   Ht 5' 4.5" (1.638 m)   Wt 233 lb 3.2 oz (105.8 kg)   SpO2 98%   BMI 39.41 kg/m   Wt Readings from Last 3 Encounters:  12/30/22 233 lb 3.2 oz (105.8 kg)  09/29/22 220 lb (99.8 kg)  09/23/22 221 lb 9.6 oz (100.5 kg)    Physical Exam Vitals and nursing note reviewed.  Constitutional:      General: She is awake. She is not in acute distress.    Appearance: Normal appearance. She is well-groomed. She is obese. She is not ill-appearing or toxic-appearing.  HENT:     Head: Normocephalic and atraumatic.     Right Ear: Hearing and external ear normal.     Left Ear: Hearing and external ear normal.  Eyes:     Conjunctiva/sclera: Conjunctivae normal.  Neck:     Thyroid:  No thyromegaly.     Vascular: No carotid bruit.  Cardiovascular:     Rate and Rhythm: Normal rate and regular rhythm.     Heart sounds: Normal heart sounds. No murmur heard.    No gallop.  Pulmonary:     Effort: Pulmonary effort is normal. No accessory muscle usage or respiratory distress.     Breath sounds: Normal breath sounds. No stridor. No decreased breath sounds, wheezing or rhonchi.  Abdominal:     General: Bowel sounds are normal. There is no distension.     Palpations: Abdomen is soft.     Tenderness: There is no abdominal tenderness.  Musculoskeletal:     Cervical back: Full passive range of motion without pain and neck supple.     Right lower  leg: Edema (trace) present.     Left lower leg: Edema (trace) present.  Lymphadenopathy:     Cervical: No cervical adenopathy.  Skin:    General: Skin is warm.  Neurological:     General: No focal deficit present.     Mental Status: She is alert and oriented to person, place, and time. Mental status is at baseline.     Cranial Nerves: Cranial nerves 2-12 are intact.     Coordination: Coordination is intact.     Gait: Gait is intact.     Deep Tendon Reflexes: Reflexes are normal and symmetric.     Reflex Scores:      Brachioradialis reflexes are 2+ on the right side and 2+ on the left side.      Patellar reflexes are 2+ on the right side and 2+ on the left side. Psychiatric:        Attention and Perception: Attention normal.        Mood and Affect: Mood normal.        Speech: Speech normal.        Behavior: Behavior normal. Behavior is cooperative.        Thought Content: Thought content normal.    Results for orders placed or performed in visit on 09/23/22  Bayer DCA Hb A1c Waived  Result Value Ref Range   HB A1C (BAYER DCA - WAIVED) 6.9 (H) 4.8 - 5.6 %  Comprehensive metabolic panel  Result Value Ref Range   Glucose 202 (H) 70 - 99 mg/dL   BUN 24 8 - 27 mg/dL   Creatinine, Ser 2.95 (H) 0.57 - 1.00 mg/dL   eGFR 47 (L)  >62 ZH/YQM/5.78   BUN/Creatinine Ratio 20 12 - 28   Sodium 142 134 - 144 mmol/L   Potassium 4.1 3.5 - 5.2 mmol/L   Chloride 100 96 - 106 mmol/L   CO2 22 20 - 29 mmol/L   Calcium 9.3 8.7 - 10.3 mg/dL   Total Protein 6.3 6.0 - 8.5 g/dL   Albumin 4.0 3.8 - 4.8 g/dL   Globulin, Total 2.3 1.5 - 4.5 g/dL   Bilirubin Total 0.4 0.0 - 1.2 mg/dL   Alkaline Phosphatase 74 44 - 121 IU/L   AST 12 0 - 40 IU/L   ALT 10 0 - 32 IU/L  Lipid Panel w/o Chol/HDL Ratio  Result Value Ref Range   Cholesterol, Total 131 100 - 199 mg/dL   Triglycerides 469 0 - 149 mg/dL   HDL 47 >62 mg/dL   VLDL Cholesterol Cal 25 5 - 40 mg/dL   LDL Chol Calc (NIH) 59 0 - 99 mg/dL      Assessment & Plan:   Problem List Items Addressed This Visit       Cardiovascular and Mediastinum   Hypertension associated with diabetes (HCC)    Chronic, ongoing.  BP at goal today for age. Continue Losartan 50 MG daily and Lasix 40 MG daily +  Amlodipine at 2.5 MG (suspect more ISH present). Recommend she monitor BP at least a few mornings a week at home and document.  DASH diet at home.  Refills as needed.  Losartan for kidney protection.  LABS: CMP.  Return in 3 months.       Relevant Orders   Bayer DCA Hb A1c Waived   Comprehensive metabolic panel     Endocrine   Adrenal insufficiency (Addison's disease) (HCC)    Chronic, ongoing.  Followed by endocrinology, continue current regimen as ordered  by them.  Recent notes reviewed and discussed medication changes with patient, she is tolerating the changes.      Hyperlipidemia associated with type 2 diabetes mellitus (HCC)    Chronic, ongoing.  Continue Crestor and adjust as needed.  CMP and Lipid check today.      Relevant Orders   Bayer DCA Hb A1c Waived   Comprehensive metabolic panel   Lipid Panel w/o Chol/HDL Ratio   Type 2 diabetes mellitus with obesity (HCC) - Primary    Chronic, ongoing with A1c 6.6% today, urine ALB 29 March 2022.  Praised for this.  Taking Metformin  1000 MG BID and tolerating, continue this.  Recommend check BS fasting every morning and document for visits.  Will plan on recheck of A1c in 3 months.  Recommend heavy focus on diabetic diet at home and regular exercise. - Eye exam up to date. Will get records. - Foot exam up to date - ARB and Statin on board - Pneumococcal and Flu vaccines up to date.      Relevant Orders   Bayer DCA Hb A1c Waived     Nervous and Auditory   Axonal sensorimotor neuropathy    Followed by neurology, recent notes reviewed.  Continue current medication regimen as prescribed by them. She is utilizing gym at [facility] on schedule.        Other   Insomnia due to other mental disorder    Chronic, ongoing.  Followed by psychiatry who fills Belsomra (Ambien currently due to no supply of Belsomra available), continue current medication regimen as prescribed by them.      Obesity    BMI 39.41.  Recommended eating smaller high protein, low fat meals more frequently and exercising 30 mins a day 5 times a week with a goal of 10-15lb weight loss in the next 3 months. Patient voiced their understanding and motivation to adhere to these recommendations.       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HCC)    Chronic, stable.  Denies SI/HI.  Followed by psychiatry -- recent notes reviewed and will maintain current medication regimen as prescribed by them.        Follow up plan: Return in about 3 months (around 03/30/2023) for T2DM, HTN/HLD, NEUROPATHY, MOOD.

## 2022-12-31 LAB — LIPID PANEL W/O CHOL/HDL RATIO
Cholesterol, Total: 140 mg/dL (ref 100–199)
HDL: 51 mg/dL (ref >39)
LDL Chol Calc (NIH): 59 mg/dL (ref 0–99)
Triglycerides: 182 mg/dL — ABNORMAL HIGH (ref 0–149)
VLDL Cholesterol Cal: 30 mg/dL (ref 5–40)

## 2022-12-31 LAB — COMPREHENSIVE METABOLIC PANEL
ALT: 12 [IU]/L (ref 0–32)
AST: 10 [IU]/L (ref 0–40)
Albumin: 4.1 g/dL (ref 3.8–4.8)
Alkaline Phosphatase: 75 [IU]/L (ref 44–121)
BUN/Creatinine Ratio: 13 (ref 12–28)
BUN: 13 mg/dL (ref 8–27)
Bilirubin Total: 0.4 mg/dL (ref 0.0–1.2)
CO2: 22 mmol/L (ref 20–29)
Calcium: 8.8 mg/dL (ref 8.7–10.3)
Chloride: 102 mmol/L (ref 96–106)
Creatinine, Ser: 1.01 mg/dL — ABNORMAL HIGH (ref 0.57–1.00)
Globulin, Total: 1.9 g/dL (ref 1.5–4.5)
Glucose: 109 mg/dL — ABNORMAL HIGH (ref 70–99)
Potassium: 4.3 mmol/L (ref 3.5–5.2)
Sodium: 142 mmol/L (ref 134–144)
Total Protein: 6 g/dL (ref 6.0–8.5)
eGFR: 60 mL/min/{1.73_m2} (ref >59)

## 2022-12-31 NOTE — Progress Notes (Signed)
Contacted via MyChart   Good morning Sarah [name], your labs have returned: - Kidney function, creatinine and eGFR, remains normal, as is liver function, AST and ALT.  - Lipid panel shows overall stable levels, with exception of mild elevation in triglycerides.  Continue all current medications.  Any questions? Keep being amazing!!  Thank you for allowing me to participate in your care.  I appreciate you. Kindest regards, Jennifermarie Franzen

## 2023-01-02 ENCOUNTER — Other Ambulatory Visit: Payer: Self-pay | Admitting: Nurse Practitioner

## 2023-01-04 NOTE — Telephone Encounter (Signed)
Requested Prescriptions  Pending Prescriptions Disp Refills   metFORMIN (GLUCOPHAGE) 500 MG tablet [Pharmacy Med Name: METFORMIN HCL 500 MG TABLET] 360 tablet 2    Sig: START OUT TAKING ONE TABLET (500 MG) BY MOUTH TWICE A DAY, IF IN ONE WEEK TOLERATING INCREASE TO 1000 MG (2 TABLETS) IN MORNING AND 500 MG (1 TABLET) IN EVENING BY MOUTH, THEN IF TOLERATING THIS AFTER ONE WEEK INCREASE TO 1000 MG (2 TABLETS) TWICE A DAY BY MOUTH.     Endocrinology:  Diabetes - Biguanides Failed - 01/04/2023  2:18 PM      Failed - Cr in normal range and within 360 days    Creat  Date Value Ref Range Status  04/23/2020 0.89 0.50 - 0.99 mg/dL Final    Comment:    For patients >64 years of age, the reference limit for Creatinine is approximately 13% higher for people identified as African-American. .    Creatinine, Ser  Date Value Ref Range Status  12/30/2022 1.01 (H) 0.57 - 1.00 mg/dL Final   Creatinine, Urine  Date Value Ref Range Status  01/10/2018 14 (L) 20 - 275 mg/dL Final         Failed - B12 Level in normal range and within 720 days    Vitamin B-12  Date Value Ref Range Status  03/17/2022 1,810 (H) 232 - 1,245 pg/mL Final         Failed - CBC within normal limits and completed in the last 12 months    WBC  Date Value Ref Range Status  11/12/2021 7.8 4.0 - 10.5 K/uL Final   RBC  Date Value Ref Range Status  11/12/2021 3.89 3.87 - 5.11 MIL/uL Final   Hemoglobin  Date Value Ref Range Status  11/12/2021 11.2 (L) 12.0 - 15.0 g/dL Final  16/10/9602 54.0 11.1 - 15.9 g/dL Final   HCT  Date Value Ref Range Status  11/12/2021 33.3 (L) 36.0 - 46.0 % Final   Hematocrit  Date Value Ref Range Status  02/04/2021 38.9 34.0 - 46.6 % Final   MCHC  Date Value Ref Range Status  11/12/2021 33.6 30.0 - 36.0 g/dL Final   [facility]  Date Value Ref Range Status  11/12/2021 28.8 26.0 - 34.0 pg Final   MCV  Date Value Ref Range Status  11/12/2021 85.6 80.0 - 100.0 fL Final  02/04/2021 88 79 - 97  fL Final  06/04/2011 88 80 - 100 fL Final   No results found for: "PLTCOUNTKUC", "LABPLAT", "POCPLA" RDW  Date Value Ref Range Status  11/12/2021 12.5 11.5 - 15.5 % Final  02/04/2021 12.6 11.7 - 15.4 % Final  06/04/2011 13.2 11.5 - 14.5 % Final         Passed - HBA1C is between 0 and 7.9 and within 180 days    Hemoglobin A1C  Date Value Ref Range Status  06/04/2011 6.3 4.2 - 6.3 % Final    Comment:    The American Diabetes Association recommends that a primary goal of therapy should be <7% and that physicians should reevaluate the treatment regimen in patients with HbA1c values consistently >8%.    HB A1C (BAYER DCA - WAIVED)  Date Value Ref Range Status  12/30/2022 6.6 (H) 4.8 - 5.6 % Final    Comment:             Prediabetes: 5.7 - 6.4          Diabetes: >6.4          Glycemic  control for adults with diabetes: <7.0          Passed - eGFR in normal range and within 360 days    GFR, Est African American  Date Value Ref Range Status  04/23/2020 77 > OR = 60 mL/min/1.49m2 Final   GFR, Est Non African American  Date Value Ref Range Status  04/23/2020 67 > OR = 60 mL/min/1.45m2 Final   GFR, Estimated  Date Value Ref Range Status  11/12/2021 59 (L) >60 mL/min Final    Comment:    (NOTE) Calculated using the CKD-EPI Creatinine Equation (2021)    eGFR  Date Value Ref Range Status  12/30/2022 60 >59 mL/min/1.73 Final         Passed - Valid encounter within last 6 months    Recent Outpatient Visits           5 days ago Type 2 diabetes mellitus with obesity (HCC)   [place] [facility] [place], McDonald T, NP   3 months ago Type 2 diabetes mellitus with obesity (HCC)   [place] [facility] [place], Fanshawe T, NP   6 months ago Type 2 diabetes mellitus with obesity (HCC)   [place] [facility] [place], [place] T, NP   8 months ago Type 2 diabetes mellitus with obesity (HCC)   [place] [facility]  [place], [place] T, NP   9 months ago Type 2 diabetes mellitus with obesity (HCC)   [facility] [place], Dorie Rank, NP       Future Appointments             In 2 months [place], Dorie Rank, NP [place] [facility]   In 5 months Deirdre Evener, MD [facility]

## 2023-01-26 ENCOUNTER — Ambulatory Visit: Payer: Medicare Other | Admitting: Behavioral Health

## 2023-02-15 ENCOUNTER — Ambulatory Visit (INDEPENDENT_AMBULATORY_CARE_PROVIDER_SITE_OTHER): Payer: Medicare Other | Admitting: Behavioral Health

## 2023-02-15 ENCOUNTER — Encounter: Payer: Self-pay | Admitting: Behavioral Health

## 2023-02-15 DIAGNOSIS — F99 Mental disorder, not otherwise specified: Secondary | ICD-10-CM | POA: Diagnosis not present

## 2023-02-15 DIAGNOSIS — F3341 Major depressive disorder, recurrent, in partial remission: Secondary | ICD-10-CM

## 2023-02-15 DIAGNOSIS — F411 Generalized anxiety disorder: Secondary | ICD-10-CM

## 2023-02-15 DIAGNOSIS — F5105 Insomnia due to other mental disorder: Secondary | ICD-10-CM

## 2023-02-15 MED ORDER — ESCITALOPRAM OXALATE 10 MG PO TABS: 10.0000 mg | ORAL_TABLET | Freq: Every day | ORAL | 1 refills | Status: DC

## 2023-02-15 MED ORDER — BELSOMRA 20 MG PO TABS: 20.0000 mg | ORAL_TABLET | Freq: Every evening | ORAL | 1 refills | Status: DC

## 2023-02-15 MED ORDER — AMITRIPTYLINE HCL 50 MG PO TABS: 50.0000 mg | ORAL_TABLET | Freq: Every day | ORAL | 1 refills | Status: DC

## 2023-02-15 NOTE — Progress Notes (Signed)
[facility]  Patient ID: Sarah [name],  MRN: [id number]  PCP: Marjie Skiff, NP  Date of Evaluation: 02/15/2023 Time spent:30 minutes  Chief Complaint:  Chief Complaint   Anxiety; Depression; Follow-up; Medication Refill; Patient Education     HISTORY/CURRENT STATUS: HPI   73 year old female presents to this office for follow up and medication management.  No changes this visit. She says that she continues to feel stable with Lexapro and does not need any changes to her medications at this time.  Still enjoying the retirement community.. She reports her anxiety at 1/10 and depression 1/10. She is sleeping well at 7-8 hours at night with aid of Ambien but would like to try a medication that is deemed safer for 65 and older. Denies mania, no psychosis. No SI/HI.    Individual Medical History/ Review of Systems: Changes? :No   Allergies: Doxycycline, Ciprofloxacin, and Penicillins  Current Medications:  Current Outpatient Medications:    Suvorexant (BELSOMRA) 20 MG TABS, Take 1 tablet (20 mg total) by mouth at bedtime., Disp: 90 tablet, Rfl: 1   amitriptyline (ELAVIL) 50 MG tablet, Take 1 tablet (50 mg total) by mouth at bedtime., Disp: 90 tablet, Rfl: 1   amLODipine (NORVASC) 2.5 MG tablet, Take 1 tablet (2.5 mg total) by mouth daily., Disp: 90 tablet, Rfl: 4   Baclofen 5 MG TABS, TAKE 1 TABLET BY MOUTH EVERY 8 HOURS AS NEEDED, Disp: 90 tablet, Rfl: 0   Blood Glucose Monitoring Suppl (ONETOUCH VERIO) w/Device KIT, Use to check blood sugar 3 times a day and document results, bring to appointments.  Goal is <130 fasting blood sugar and <180 two hours after meals., Disp: 1 kit, Rfl: 0   Cholecalciferol (VITAMIN D) 50 MCG (2000 UT) CAPS, Take by mouth., Disp: , Rfl:    Cyanocobalamin (VITAMIN B-12 PO), Take by mouth daily., Disp: , Rfl:    escitalopram (LEXAPRO) 10 MG tablet, Take 1 tablet (10 mg total) by mouth daily., Disp: 90 tablet, Rfl: 1   FIBER ADULT GUMMIES PO,  Take 1 tablet by mouth daily., Disp: , Rfl:    furosemide (LASIX) 40 MG tablet, Take 1 tablet (40 mg total) by mouth daily., Disp: 90 tablet, Rfl: 4   glucose blood (ONETOUCH VERIO) test strip, Use as instructed, Disp: 100 each, Rfl: 12   hydrocortisone (CORTEF) 10 MG tablet, daily. 30 mg in the AM and 20 in the PM, Disp: , Rfl: 3   Lancets (ONETOUCH ULTRASOFT) lancets, Use to check blood sugar 3 times a day and document results, bring to appointments.  Goal is <130 fasting blood sugar and <180 two hours after meals., Disp: 100 each, Rfl: 12   losartan (COZAAR) 50 MG tablet, Take 1 tablet (50 mg total) by mouth daily., Disp: 90 tablet, Rfl: 4   meclizine (ANTIVERT) 25 MG tablet, Take 1 tablet (25 mg total) by mouth 3 (three) times daily as needed for dizziness., Disp: 30 tablet, Rfl: 2   metFORMIN (GLUCOPHAGE) 1000 MG tablet, Take 1 tablet (1,000 mg total) by mouth 2 (two) times daily with a meal., Disp: 180 tablet, Rfl: 4   pregabalin (LYRICA) 150 MG capsule, 150 mg in am, 150 MG in afternoon, and 300 mg in pm, Disp: , Rfl:    rosuvastatin (CRESTOR) 10 MG tablet, Take 1 tablet (10 mg total) by mouth daily., Disp: 90 tablet, Rfl: 4   zolpidem (AMBIEN) 5 MG tablet, Take 1 tablet (5 mg total) by mouth at bedtime as  needed for sleep., Disp: 30 tablet, Rfl: 0 Medication Side Effects: none  Family Medical/ Social History: Changes? No  MENTAL HEALTH EXAM:  There were no vitals taken for this visit.There is no height or weight on file to calculate BMI.  General Appearance: Casual, Neat, and Well Groomed  Eye Contact:  Good  Speech:  Clear and Coherent  Volume:  Normal  Mood:  NA  Affect:  Appropriate  Thought Process:  Coherent  Orientation:  Full (Time, Place, and Person)  Thought Content: Logical   Suicidal Thoughts:  No  Homicidal Thoughts:  No  Memory:  WNL  Judgement:  Good  Insight:  Good  Psychomotor Activity:  Normal  Concentration:  Concentration: Good  Recall:  Good  Fund of  Knowledge: Good  Language: Good  Assets:  Desire for Improvement  ADL's:  Intact  Cognition: WNL  Prognosis:  Good    DIAGNOSES:    ICD-10-CM   1. Insomnia due to other mental disorder  F51.05 amitriptyline (ELAVIL) 50 MG tablet   F99 Suvorexant (BELSOMRA) 20 MG TABS    2. Generalized anxiety disorder  F41.1 escitalopram (LEXAPRO) 10 MG tablet    3. Recurrent major depressive disorder, in partial remission (HCC)  F33.41 escitalopram (LEXAPRO) 10 MG tablet      Receiving Psychotherapy: No    RECOMMENDATIONS:   Greater than 50% of 30 min  face to face time with patient was spent on counseling and coordination of care. No changes this visit.  We discussed her current stability with her current medications. She is reporting very low levels of anxiety and depression with Lexapro. No changes are indicated at this time. Reinforced documented previous and current flagged warning of  QT prolongation and risk with Lexapro. She has been on the medication as reported by Pt since 2018.    To continue Lexapro 10 mg daily To continue Belsomra 20 mg at bedtime To continue Amitriptyline 50 mg at bedtime Will report any worsening symptoms promptly To follow up in 5 months to reassess per pt request Provided emergency contact information Reviewed PDMP    Joan Flores, NP

## 2023-02-20 ENCOUNTER — Other Ambulatory Visit: Payer: Self-pay | Admitting: Nurse Practitioner

## 2023-02-22 NOTE — Telephone Encounter (Signed)
Requested Prescriptions  Pending Prescriptions Disp Refills   glucose blood (ONETOUCH VERIO) test strip [Pharmacy Med Name: ONE TOUCH VERIO TEST STRIP] 100 strip 2    Sig: USE AS INSTRUCTED     Endocrinology: Diabetes - Testing Supplies Passed - 02/22/2023  5:54 PM      Passed - Valid encounter within last 12 months    Recent Outpatient Visits           1 month ago Type 2 diabetes mellitus with obesity (HCC)   [place] [facility] [place], [place] T, NP   5 months ago Type 2 diabetes mellitus with obesity (HCC)   [facility], [place] T, NP   8 months ago Type 2 diabetes mellitus with obesity (HCC)   [facility] [place], [place] T, NP   10 months ago Type 2 diabetes mellitus with obesity (HCC)   [facility] [place], [place] Park T, NP   11 months ago Type 2 diabetes mellitus with obesity (HCC)   [place] [facility] [place], Dorie Rank, NP       Future Appointments             In 1 month [place], Dorie Rank, NP [facility], [facility]   In 3 months Deirdre Evener, MD  [facility]            Refused Prescriptions Disp Refills   metFORMIN (GLUCOPHAGE) 500 MG tablet [Pharmacy Med Name: METFORMIN HCL 500 MG TABLET] 360 tablet 2    Sig: START OUT TAKING ONE TABLET (500 MG) BY MOUTH TWICE A DAY, IF IN ONE WEEK TOLERATING INCREASE TO 1000 MG (2 TABLETS) IN MORNING AND 500 MG (1 TABLET) IN EVENING BY MOUTH, THEN IF TOLERATING THIS AFTER ONE WEEK INCREASE TO 1000 MG (2 TABLETS) TWICE A DAY BY MOUTH.     Endocrinology:  Diabetes - Biguanides Failed - 02/22/2023  5:54 PM      Failed - Cr in normal range and within 360 days    Creat  Date Value Ref Range Status  04/23/2020 0.89 0.50 - 0.99 mg/dL Final    Comment:    For patients >98 years of age, the reference limit for Creatinine is approximately 13% higher for people identified as  African-American. .    Creatinine, Ser  Date Value Ref Range Status  12/30/2022 1.01 (H) 0.57 - 1.00 mg/dL Final   Creatinine, Urine  Date Value Ref Range Status  01/10/2018 14 (L) 20 - 275 mg/dL Final         Failed - B12 Level in normal range and within 720 days    Vitamin B-12  Date Value Ref Range Status  03/17/2022 1,810 (H) 232 - 1,245 pg/mL Final         Failed - CBC within normal limits and completed in the last 12 months    WBC  Date Value Ref Range Status  11/12/2021 7.8 4.0 - 10.5 K/uL Final   RBC  Date Value Ref Range Status  11/12/2021 3.89 3.87 - 5.11 MIL/uL Final   Hemoglobin  Date Value Ref Range Status  11/12/2021 11.2 (L) 12.0 - 15.0 g/dL Final  40/98/1191 47.8 11.1 - 15.9 g/dL Final   HCT  Date Value Ref Range Status  11/12/2021 33.3 (L) 36.0 - 46.0 % Final   Hematocrit  Date Value Ref Range Status  02/04/2021 38.9 34.0 - 46.6 % Final  MCHC  Date Value Ref Range Status  11/12/2021 33.6 30.0 - 36.0 g/dL Final   [facility]  Date Value Ref Range Status  11/12/2021 28.8 26.0 - 34.0 pg Final   MCV  Date Value Ref Range Status  11/12/2021 85.6 80.0 - 100.0 fL Final  02/04/2021 88 79 - 97 fL Final  06/04/2011 88 80 - 100 fL Final   No results found for: "PLTCOUNTKUC", "LABPLAT", "POCPLA" RDW  Date Value Ref Range Status  11/12/2021 12.5 11.5 - 15.5 % Final  02/04/2021 12.6 11.7 - 15.4 % Final  06/04/2011 13.2 11.5 - 14.5 % Final         Passed - HBA1C is between 0 and 7.9 and within 180 days    Hemoglobin A1C  Date Value Ref Range Status  06/04/2011 6.3 4.2 - 6.3 % Final    Comment:    The American Diabetes Association recommends that a primary goal of therapy should be <7% and that physicians should reevaluate the treatment regimen in patients with HbA1c values consistently >8%.    HB A1C (BAYER DCA - WAIVED)  Date Value Ref Range Status  12/30/2022 6.6 (H) 4.8 - 5.6 % Final    Comment:             Prediabetes: 5.7 - 6.4           Diabetes: >6.4          Glycemic control for adults with diabetes: <7.0          Passed - eGFR in normal range and within 360 days    GFR, Est African American  Date Value Ref Range Status  04/23/2020 77 > OR = 60 mL/min/1.40m2 Final   GFR, Est Non African American  Date Value Ref Range Status  04/23/2020 67 > OR = 60 mL/min/1.54m2 Final   GFR, Estimated  Date Value Ref Range Status  11/12/2021 59 (L) >60 mL/min Final    Comment:    (NOTE) Calculated using the CKD-EPI Creatinine Equation (2021)    eGFR  Date Value Ref Range Status  12/30/2022 60 >59 mL/min/1.73 Final         Passed - Valid encounter within last 6 months    Recent Outpatient Visits           1 month ago Type 2 diabetes mellitus with obesity (HCC)   [facility] [place], [place] T, NP   5 months ago Type 2 diabetes mellitus with obesity (HCC)   [place] [facility] [place], Rogers T, NP   8 months ago Type 2 diabetes mellitus with obesity (HCC)   [place] [facility] [place], [place] T, NP   10 months ago Type 2 diabetes mellitus with obesity (HCC)   [place] [facility] [place], [place] T, NP   11 months ago Type 2 diabetes mellitus with obesity (HCC)   [facility] [place], Dorie Rank, NP       Future Appointments             In 1 month [place], Dorie Rank, NP  [facility], [facility]   In 3 months Deirdre Evener, MD [facility]

## 2023-03-04 ENCOUNTER — Other Ambulatory Visit: Payer: Self-pay | Admitting: Nurse Practitioner

## 2023-03-04 NOTE — Telephone Encounter (Signed)
Reordered 02/26/22 #90 4 RF should have enough to last until May  Requested Prescriptions  Refused Prescriptions Disp Refills   rosuvastatin (CRESTOR) 10 MG tablet [Pharmacy Med Name: ROSUVASTATIN CALCIUM 10 MG TAB] 90 tablet 4    Sig: TAKE 1 TABLET BY MOUTH EVERY DAY     Cardiovascular:  Antilipid - Statins 2 Failed - 03/04/2023  1:53 PM      Failed - Cr in normal range and within 360 days    Creat  Date Value Ref Range Status  04/23/2020 0.89 0.50 - 0.99 mg/dL Final    Comment:    For patients >25 years of age, the reference limit for Creatinine is approximately 13% higher for people identified as African-American. .    Creatinine, Ser  Date Value Ref Range Status  12/30/2022 1.01 (H) 0.57 - 1.00 mg/dL Final   Creatinine, Urine  Date Value Ref Range Status  01/10/2018 14 (L) 20 - 275 mg/dL Final         Failed - Lipid Panel in normal range within the last 12 months    Cholesterol, Total  Date Value Ref Range Status  12/30/2022 140 100 - 199 mg/dL Final   Cholesterol  Date Value Ref Range Status  06/04/2011 191 0 - 200 mg/dL Final   Ldl Cholesterol, Calc  Date Value Ref Range Status  06/04/2011 106 (H) 0 - 100 mg/dL Final   LDL Cholesterol (Calc)  Date Value Ref Range Status  04/23/2020 150 (H) mg/dL (calc) Final    Comment:    Reference range: <100 . Desirable range <100 mg/dL for primary prevention;   <70 mg/dL for patients with CHD or diabetic patients  with > or = 2 CHD risk factors. Marland Kitchen LDL-C is now calculated using the Martin-Hopkins  calculation, which is a validated novel method providing  better accuracy than the Friedewald equation in the  estimation of LDL-C.  Horald Pollen et al. Lenox Ahr. 6045;409(81): 2061-2068  (http://education.QuestDiagnostics.com/faq/FAQ164)    LDL Chol Calc (NIH)  Date Value Ref Range Status  12/30/2022 59 0 - 99 mg/dL Final   HDL Cholesterol  Date Value Ref Range Status  06/04/2011 34 (L) 40 - 60 mg/dL Final   HDL  Date  Value Ref Range Status  12/30/2022 51 >39 mg/dL Final   Triglycerides  Date Value Ref Range Status  12/30/2022 182 (H) 0 - 149 mg/dL Final  19/14/7829 562 (H) 0 - 200 mg/dL Final         Passed - Patient is not pregnant      Passed - Valid encounter within last 12 months    Recent Outpatient Visits           2 months ago Type 2 diabetes mellitus with obesity (HCC)   [place] [facility] [place], Ferriday T, NP   5 months ago Type 2 diabetes mellitus with obesity (HCC)   [place] [facility] [place], [place] T, NP   8 months ago Type 2 diabetes mellitus with obesity (HCC)   [place] [facility] [place], [place] T, NP   10 months ago Type 2 diabetes mellitus with obesity (HCC)   [place] [facility] [place], Page T, NP   11 months ago Type 2 diabetes mellitus with obesity (HCC)   [place] [facility] [place], Dorie Rank, NP       Future Appointments             In 3 weeks [place],  Dorie Rank, NP [facility]   In 3 months Deirdre Evener, MD [facility]             amLODipine (NORVASC) 2.5 MG tablet [Pharmacy Med Name: AMLODIPINE BESYLATE 2.5 MG TAB] 90 tablet 4    Sig: TAKE 1 TABLET BY MOUTH EVERY DAY     Cardiovascular: Calcium Channel Blockers 2 Passed - 03/04/2023  1:53 PM      Passed - Last BP in normal range    BP Readings from Last 1 Encounters:  12/30/22 112/68         Passed - Last Heart Rate in normal range    Pulse Readings from Last 1 Encounters:  12/30/22 77         Passed - Valid encounter within last 6 months    Recent Outpatient Visits           2 months ago Type 2 diabetes mellitus with obesity (HCC)   [facility] [place], [place] T, NP   5 months ago Type 2 diabetes mellitus with obesity (HCC)   [place] [facility] [place], [place] T, NP   8 months ago Type 2 diabetes mellitus  with obesity (HCC)   [place] [facility] [place], [place] T, NP   10 months ago Type 2 diabetes mellitus with obesity (HCC)   [place] [facility] [place], Keller T, NP   11 months ago Type 2 diabetes mellitus with obesity (HCC)   [place] [facility] [place], Dorie Rank, NP       Future Appointments             In 3 weeks Marjie Skiff, NP [place] [facility], [facility]   In 3 months Deirdre Evener, MD [place] [facility]             losartan (COZAAR) 50 MG tablet [Pharmacy Med Name: LOSARTAN POTASSIUM 50 MG TAB] 90 tablet 4    Sig: TAKE 1 TABLET BY MOUTH EVERY DAY     Cardiovascular:  Angiotensin Receptor Blockers Failed - 03/04/2023  1:53 PM      Failed - Cr in normal range and within 180 days    Creat  Date Value Ref Range Status  04/23/2020 0.89 0.50 - 0.99 mg/dL Final    Comment:    For patients >11 years of age, the reference limit for Creatinine is approximately 13% higher for people identified as African-American. .    Creatinine, Ser  Date Value Ref Range Status  12/30/2022 1.01 (H) 0.57 - 1.00 mg/dL Final   Creatinine, Urine  Date Value Ref Range Status  01/10/2018 14 (L) 20 - 275 mg/dL Final         Passed - K in normal range and within 180 days    Potassium  Date Value Ref Range Status  12/30/2022 4.3 3.5 - 5.2 mmol/L Final  06/04/2011 3.4 (L) 3.5 - 5.1 mmol/L Final         Passed - Patient is not pregnant      Passed - Last BP in normal range    BP Readings from Last 1 Encounters:  12/30/22 112/68         Passed - Valid encounter within last 6 months    Recent Outpatient Visits           2 months ago Type 2 diabetes mellitus with obesity (  HCC)   [facility] [place], Roscoe T, NP   5 months ago Type 2 diabetes mellitus with obesity (HCC)   [place] [facility] [facility] [place], Beluga T, NP   8 months ago Type 2 diabetes mellitus  with obesity (HCC)   [place] [place], Lisman T, NP   10 months ago Type 2 diabetes mellitus with obesity (HCC)   [place] [place], [place] T, NP   11 months ago Type 2 diabetes mellitus with obesity (HCC)   [place] [facility] [place], Dorie Rank, NP       Future Appointments             In 3 weeks Marjie Skiff, NP [place] [facility]   In 3 months Deirdre Evener, MD [facility]

## 2023-03-06 ENCOUNTER — Other Ambulatory Visit: Payer: Self-pay | Admitting: Nurse Practitioner

## 2023-03-08 NOTE — Telephone Encounter (Signed)
Requested medication (s) are due for refill today:   Yes  Requested medication (s) are on the active medication list:   Yes  Future visit scheduled:   Yes 3/11   Last ordered: 02/26/2022 #90, 4 refills  Labs due per protocol and new rx    Requested Prescriptions  Pending Prescriptions Disp Refills   furosemide (LASIX) 40 MG tablet [Pharmacy Med Name: FUROSEMIDE 40 MG TABLET] 90 tablet 4    Sig: TAKE 1 TABLET BY MOUTH EVERY DAY     Cardiovascular:  Diuretics - Loop Failed - 03/08/2023  2:53 PM      Failed - Cr in normal range and within 180 days    Creat  Date Value Ref Range Status  04/23/2020 0.89 0.50 - 0.99 mg/dL Final    Comment:    For patients >61 years of age, the reference limit for Creatinine is approximately 13% higher for people identified as African-American. .    Creatinine, Ser  Date Value Ref Range Status  12/30/2022 1.01 (H) 0.57 - 1.00 mg/dL Final   Creatinine, Urine  Date Value Ref Range Status  01/10/2018 14 (L) 20 - 275 mg/dL Final         Failed - Mg Level in normal range and within 180 days    Magnesium  Date Value Ref Range Status  09/04/2017 1.5 (L) 1.7 - 2.4 mg/dL Final    Comment:    Performed at [facility], [address], [place], [place] [address]  06/04/2011 1.9 mg/dL Final    Comment:    1.9-1.4 THERAPEUTIC RANGE: 4-7 mg/dL TOXIC: > 10 mg/dL  -----------------------          Passed - K in normal range and within 180 days    Potassium  Date Value Ref Range Status  12/30/2022 4.3 3.5 - 5.2 mmol/L Final  06/04/2011 3.4 (L) 3.5 - 5.1 mmol/L Final         Passed - Ca in normal range and within 180 days    Calcium  Date Value Ref Range Status  12/30/2022 8.8 8.7 - 10.3 mg/dL Final   Calcium, Total  Date Value Ref Range Status  06/04/2011 7.5 (L) 8.5 - 10.1 mg/dL Final   Calcium, Ion  Date Value Ref Range Status  08/25/2010 1.15 1.12 - 1.32 mmol/L Final         Passed - Na in normal range and within  180 days    Sodium  Date Value Ref Range Status  12/30/2022 142 134 - 144 mmol/L Final  06/04/2011 146 (H) 136 - 145 mmol/L Final         Passed - Cl in normal range and within 180 days    Chloride  Date Value Ref Range Status  12/30/2022 102 96 - 106 mmol/L Final  06/04/2011 110 (H) 98 - 107 mmol/L Final         Passed - Last BP in normal range    BP Readings from Last 1 Encounters:  12/30/22 112/68         Passed - Valid encounter within last 6 months    Recent Outpatient Visits           2 months ago Type 2 diabetes mellitus with obesity (HCC)   [place] [facility] [place], [place] T, NP   5 months ago Type 2 diabetes mellitus with obesity (HCC)   [place] [facility] [place], Corrie Dandy T, NP   8 months ago  Type 2 diabetes mellitus with obesity (HCC)   [place] [facility] [place], [place]aming[place] T, NP   10 months ago Type 2 diabetes mellitus with obesity (HCC)   [place] [place], [place] T, NP   11 months ago Type 2 diabetes mellitus with obesity (HCC)   [place] [facility] [place], Dorie Rank, NP       Future Appointments             In 3 weeks Marjie Skiff, NP [facility] [facility], [facility]   In 3 months Deirdre Evener, MD [facility]

## 2023-03-23 ENCOUNTER — Other Ambulatory Visit: Payer: Self-pay | Admitting: Nurse Practitioner

## 2023-03-23 NOTE — Telephone Encounter (Signed)
 Requested Prescriptions  Pending Prescriptions Disp Refills   OneTouch UltraSoft 2 Lancets MISC [Pharmacy Med Name: ONE TOUCH ULTRASOFT2 30G LANCT] 100 each 2    Sig: USE TO CHECK BLOOD SUGAR 3 TIMES A DAY AND DOCUMENT RESULTS, BRING TO APPOINTMENTS. GOAL IS <130 FASTING BLOOD SUGAR AND <180 TWO HOURS AFTER MEALS.     Endocrinology: Diabetes - Testing Supplies Passed - 03/23/2023  5:49 PM      Passed - Valid encounter within last 12 months    Recent Outpatient Visits           2 months ago Type 2 diabetes mellitus with obesity (HCC)   [place] [facility] [place], Hebron T, NP   6 months ago Type 2 diabetes mellitus with obesity (HCC)   [facility] [place], Britton T, NP   9 months ago Type 2 diabetes mellitus with obesity (HCC)   [facility] [place], Woods Landing-Jelm T, NP   10 months ago Type 2 diabetes mellitus with obesity (HCC)   [place] [facility] [place], Corrie Dandy T, NP   1 year ago Type 2 diabetes mellitus with obesity (HCC)   [place] [facility] [place], Dorie Rank, NP       Future Appointments             In 1 week Marjie Skiff, NP  [facility], [facility]   In 2 months Deirdre Evener, MD [facility]

## 2023-03-28 DIAGNOSIS — E119 Type 2 diabetes mellitus without complications: Secondary | ICD-10-CM | POA: Insufficient documentation

## 2023-03-30 ENCOUNTER — Ambulatory Visit: Payer: Medicare Other | Admitting: Nurse Practitioner

## 2023-03-30 DIAGNOSIS — G6289 Other specified polyneuropathies: Secondary | ICD-10-CM

## 2023-03-30 DIAGNOSIS — E66812 Obesity, class 2: Secondary | ICD-10-CM

## 2023-03-30 DIAGNOSIS — E119 Type 2 diabetes mellitus without complications: Secondary | ICD-10-CM

## 2023-03-30 DIAGNOSIS — E1159 Type 2 diabetes mellitus with other circulatory complications: Secondary | ICD-10-CM

## 2023-03-30 DIAGNOSIS — E538 Deficiency of other specified B group vitamins: Secondary | ICD-10-CM

## 2023-03-30 DIAGNOSIS — F5105 Insomnia due to other mental disorder: Secondary | ICD-10-CM

## 2023-03-30 DIAGNOSIS — F3341 Major depressive disorder, recurrent, in partial remission: Secondary | ICD-10-CM

## 2023-03-30 DIAGNOSIS — E271 Primary adrenocortical insufficiency: Secondary | ICD-10-CM

## 2023-03-30 DIAGNOSIS — E559 Vitamin D deficiency, unspecified: Secondary | ICD-10-CM

## 2023-03-30 DIAGNOSIS — E1169 Type 2 diabetes mellitus with other specified complication: Secondary | ICD-10-CM

## 2023-04-03 ENCOUNTER — Other Ambulatory Visit: Payer: Self-pay | Admitting: Nurse Practitioner

## 2023-04-03 ENCOUNTER — Other Ambulatory Visit: Payer: Self-pay | Admitting: Behavioral Health

## 2023-04-03 DIAGNOSIS — F411 Generalized anxiety disorder: Secondary | ICD-10-CM

## 2023-04-03 DIAGNOSIS — F3341 Major depressive disorder, recurrent, in partial remission: Secondary | ICD-10-CM

## 2023-04-06 NOTE — Telephone Encounter (Signed)
 Requested Prescriptions  Pending Prescriptions Disp Refills   losartan (COZAAR) 50 MG tablet [Pharmacy Med Name: LOSARTAN POTASSIUM 50 MG TAB] 90 tablet 0    Sig: TAKE 1 TABLET BY MOUTH EVERY DAY     Cardiovascular:  Angiotensin Receptor Blockers Failed - 04/06/2023  8:02 AM      Failed - Cr in normal range and within 180 days    Creat  Date Value Ref Range Status  04/23/2020 0.89 0.50 - 0.99 mg/dL Final    Comment:    For patients >72 years of age, the reference limit for Creatinine is approximately 13% higher for people identified as African-American. .    Creatinine, Ser  Date Value Ref Range Status  12/30/2022 1.01 (H) 0.57 - 1.00 mg/dL Final   Creatinine, Urine  Date Value Ref Range Status  01/10/2018 14 (L) 20 - 275 mg/dL Final         Passed - K in normal range and within 180 days    Potassium  Date Value Ref Range Status  12/30/2022 4.3 3.5 - 5.2 mmol/L Final  06/04/2011 3.4 (L) 3.5 - 5.1 mmol/L Final         Passed - Patient is not pregnant      Passed - Last BP in normal range    BP Readings from Last 1 Encounters:  12/30/22 112/68         Passed - Valid encounter within last 6 months    Recent Outpatient Visits           3 months ago Type 2 diabetes mellitus with obesity (HCC)   [facility] [place], Bloom[place] T, NP   6 months ago Type 2 diabetes mellitus with obesity (HCC)   [facility] [place], [place] T, NP   9 months ago Type 2 diabetes mellitus with obesity (HCC)   [place] [place], Sardis T, NP   11 months ago Type 2 diabetes mellitus with obesity (HCC)   [place] [facility] [place], Corrie Dandy T, NP   1 year ago Type 2 diabetes mellitus with obesity (HCC)   [place] [facility] [place], Corrie Dandy T, NP       Future Appointments             In 2 months Deirdre Evener, MD [place] [facility]             rosuvastatin  (CRESTOR) 10 MG tablet [Pharmacy Med Name: ROSUVASTATIN CALCIUM 10 MG TAB] 90 tablet 0    Sig: TAKE 1 TABLET BY MOUTH EVERY DAY     Cardiovascular:  Antilipid - Statins 2 Failed - 04/06/2023  8:02 AM      Failed - Cr in normal range and within 360 days    Creat  Date Value Ref Range Status  04/23/2020 0.89 0.50 - 0.99 mg/dL Final    Comment:    For patients >68 years of age, the reference limit for Creatinine is approximately 13% higher for people identified as African-American. .    Creatinine, Ser  Date Value Ref Range Status  12/30/2022 1.01 (H) 0.57 - 1.00 mg/dL Final   Creatinine, Urine  Date Value Ref Range Status  01/10/2018 14 (L) 20 - 275 mg/dL Final         Failed - Lipid Panel in normal range within the last 12 months    Cholesterol, Total  Date Value Ref Range Status  12/30/2022  140 100 - 199 mg/dL Final   Cholesterol  Date Value Ref Range Status  06/04/2011 191 0 - 200 mg/dL Final   Ldl Cholesterol, Calc  Date Value Ref Range Status  06/04/2011 106 (H) 0 - 100 mg/dL Final   LDL Cholesterol (Calc)  Date Value Ref Range Status  04/23/2020 150 (H) mg/dL (calc) Final    Comment:    Reference range: <100 . Desirable range <100 mg/dL for primary prevention;   <70 mg/dL for patients with CHD or diabetic patients  with > or = 2 CHD risk factors. Marland Kitchen LDL-C is now calculated using the Martin-Hopkins  calculation, which is a validated novel method providing  better accuracy than the Friedewald equation in the  estimation of LDL-C.  Horald Pollen et al. Lenox Ahr. 1610;960(45): 2061-2068  (http://education.Quest[employer].com/faq/FAQ164)    LDL Chol Calc (NIH)  Date Value Ref Range Status  12/30/2022 59 0 - 99 mg/dL Final   HDL Cholesterol  Date Value Ref Range Status  06/04/2011 34 (L) 40 - 60 mg/dL Final   HDL  Date Value Ref Range Status  12/30/2022 51 >39 mg/dL Final   Triglycerides  Date Value Ref Range Status  12/30/2022 182 (H) 0 - 149 mg/dL Final   40/98/1191 478 (H) 0 - 200 mg/dL Final         Passed - Patient is not pregnant      Passed - Valid encounter within last 12 months    Recent Outpatient Visits           3 months ago Type 2 diabetes mellitus with obesity (HCC)   [place] [facility] [place], [place] T, NP   6 months ago Type 2 diabetes mellitus with obesity (HCC)   [facility] [facility] [place], [place] T, NP   9 months ago Type 2 diabetes mellitus with obesity (HCC)   [place] [facility] [place], [place] T, NP   11 months ago Type 2 diabetes mellitus with obesity (HCC)   [place] [facility] [place], Eden T, NP   1 year ago Type 2 diabetes mellitus with obesity (HCC)   [place] [facility] [place], Corrie Dandy T, NP       Future Appointments             In 2 months Deirdre Evener, MD [place] [facility]             amLODipine (NORVASC) 2.5 MG tablet [Pharmacy Med Name: AMLODIPINE BESYLATE 2.5 MG TAB] 90 tablet 0    Sig: TAKE 1 TABLET BY MOUTH EVERY DAY     Cardiovascular: Calcium Channel Blockers 2 Passed - 04/06/2023  8:02 AM      Passed - Last BP in normal range    BP Readings from Last 1 Encounters:  12/30/22 112/68         Passed - Last Heart Rate in normal range    Pulse Readings from Last 1 Encounters:  12/30/22 77         Passed - Valid encounter within last 6 months    Recent Outpatient Visits           3 months ago Type 2 diabetes mellitus with obesity (HCC)   [place] [facility] [place], Ronne[place] T, NP   6 months ago Type 2 diabetes mellitus with obesity (HCC)   [place] [facility] [place], Alton T, NP   9 months ago Type 2 diabetes  mellitus with obesity (HCC)   [place] [facility] [place], Movico T, NP   11 months ago Type 2 diabetes mellitus with obesity (HCC)   [facility] [place], Corrie Dandy T, NP   1 year ago Type 2  diabetes mellitus with obesity [facility])   [place] [facility] Marjie Skiff, NP       Future Appointments             In 2 months Deirdre Evener, MD [facility]

## 2023-04-07 ENCOUNTER — Ambulatory Visit: Payer: Self-pay | Admitting: Nurse Practitioner

## 2023-04-07 ENCOUNTER — Encounter: Payer: Self-pay | Admitting: Nurse Practitioner

## 2023-04-07 ENCOUNTER — Ambulatory Visit (INDEPENDENT_AMBULATORY_CARE_PROVIDER_SITE_OTHER): Admitting: Nurse Practitioner

## 2023-04-07 VITALS — BP 126/64 | HR 94 | Temp 98.0°F | Ht 64.5 in | Wt 232.4 lb

## 2023-04-07 DIAGNOSIS — M545 Low back pain, unspecified: Secondary | ICD-10-CM

## 2023-04-07 DIAGNOSIS — G8929 Other chronic pain: Secondary | ICD-10-CM | POA: Diagnosis not present

## 2023-04-07 MED ORDER — TRIAMCINOLONE ACETONIDE 40 MG/ML IJ SUSP
40.0000 mg | Freq: Once | INTRAMUSCULAR | Status: AC
  Administered 2023-04-07: 40 mg via INTRAMUSCULAR

## 2023-04-07 MED ORDER — LIDOCAINE 5 % EX PTCH: 1.0000 | MEDICATED_PATCH | CUTANEOUS | 0 refills | Status: DC

## 2023-04-07 MED ORDER — METHYLPREDNISOLONE 4 MG PO TBPK: ORAL_TABLET | ORAL | 0 refills | Status: DC

## 2023-04-07 MED ORDER — LIDOCAINE 5 % EX PTCH
1.0000 | MEDICATED_PATCH | CUTANEOUS | 0 refills | Status: DC
Start: 2023-04-07 — End: 2023-04-07

## 2023-04-07 MED ORDER — HYDROCODONE-ACETAMINOPHEN 5-325 MG PO TABS: 1.0000 | ORAL_TABLET | Freq: Three times a day (TID) | ORAL | 0 refills | Status: DC | PRN

## 2023-04-07 NOTE — Patient Instructions (Addendum)
 Can take Zyrtec  IMAGING LOCATION: [address] [place], [place], [place] [address] [phone]   Acute Back Pain, Adult Acute back pain is sudden and usually short-lived. It is often caused by an injury to the muscles and tissues in the back. The injury may result from: A muscle, tendon, or ligament getting overstretched or torn. Ligaments are tissues that connect bones to each other. Lifting something improperly can cause a back strain. Wear and tear (degeneration) of the spinal disks. Spinal disks are circular tissue that provide cushioning between the bones of the spine (vertebrae). Twisting motions, such as while playing sports or doing yard work. A hit to the back. Arthritis. You may have a physical exam, lab tests, and imaging tests to find the cause of your pain. Acute back pain usually goes away with rest and home care. Follow these instructions at home: Managing pain, stiffness, and swelling Take over-the-counter and prescription medicines only as told by your health care provider. Treatment may include medicines for pain and inflammation that are taken by mouth or applied to the skin, or muscle relaxants. Your health care provider may recommend applying ice during the first 24-48 hours after your pain starts. To do this: Put ice in a plastic bag. Place a towel between your skin and the bag. Leave the ice on for 20 minutes, 2-3 times a day. Remove the ice if your skin turns bright red. This is very important. If you cannot feel pain, heat, or cold, you have a greater risk of damage to the area. If directed, apply heat to the affected area as often as told by your health care provider. Use the heat source that your health care provider recommends, such as a moist heat pack or a heating pad. Place a towel between your skin and the heat source. Leave the heat on for 20-30 minutes. Remove the heat if your skin turns bright red. This is especially important if you are unable to feel  pain, heat, or cold. You have a greater risk of getting burned. Activity  Do not stay in bed. Staying in bed for more than 1-2 days can delay your recovery. Sit up and stand up straight. Avoid leaning forward when you sit or hunching over when you stand. If you work at a desk, sit close to it so you do not need to lean over. Keep your chin tucked in. Keep your neck drawn back, and keep your elbows bent at a 90-degree angle (right angle). Sit high and close to the steering wheel when you drive. Add lower back (lumbar) support to your car seat, if needed. Take short walks on even surfaces as soon as you are able. Try to increase the length of time you walk each day. Do not sit, drive, or stand in one place for more than 30 minutes at a time. Sitting or standing for long periods of time can put stress on your back. Do not drive or use heavy machinery while taking prescription pain medicine. Use proper lifting techniques. When you bend and lift, use positions that put less stress on your back: [place] your knees. Keep the load close to your body. Avoid twisting. Exercise regularly as told by your health care provider. Exercising helps your back heal faster and helps prevent back injuries by keeping muscles strong and flexible. Work with a physical therapist to make a safe exercise program, as recommended by your health care provider. Do any exercises as told by your physical therapist. Lifestyle  Maintain a healthy weight. Extra weight puts stress on your back and makes it difficult to have good posture. Avoid activities or situations that make you feel anxious or stressed. Stress and anxiety increase muscle tension and can make back pain worse. Learn ways to manage anxiety and stress, such as through exercise. General instructions Sleep on a firm mattress in a comfortable position. Try lying on your side with your knees slightly bent. If you lie on your back, put a pillow under your knees. Keep your  head and neck in a straight line with your spine (neutral position) when using electronic equipment like smartphones or pads. To do this: Raise your smartphone or pad to look at it instead of bending your head or neck to look down. Put the smartphone or pad at the level of your face while looking at the screen. Follow your treatment plan as told by your health care provider. This may include: Cognitive or behavioral therapy. Acupuncture or massage therapy. Meditation or yoga. Contact a health care provider if: You have pain that is not relieved with rest or medicine. You have increasing pain going down into your legs or buttocks. Your pain does not improve after 2 weeks. You have pain at night. You lose weight without trying. You have a fever or chills. You develop nausea or vomiting. You develop abdominal pain. Get help right away if: You develop new bowel or bladder control problems. You have unusual weakness or numbness in your arms or legs. You feel faint. These symptoms may represent a serious problem that is an emergency. Do not wait to see if the symptoms will go away. Get medical help right away. Call your local emergency services (911 in the [place] Do not drive yourself to the hospital. Summary Acute back pain is sudden and usually short-lived. Use proper lifting techniques. When you bend and lift, use positions that put less stress on your back. Take over-the-counter and prescription medicines only as told by your health care provider, and apply heat or ice as told. This information is not intended to replace advice given to you by your health care provider. Make sure you discuss any questions you have with your health care provider. Document Revised: 03/29/2020 Document Reviewed: 03/29/2020 Elsevier Patient Education  2024 [employer].

## 2023-04-07 NOTE — Telephone Encounter (Signed)
  Chief Complaint: back pain Symptoms: pain Frequency: constant   Disposition: [] ED /[] Urgent Care (no appt availability in office) / [x] Appointment(In office/virtual)/ []  [facility]/ [] Home Care/ [] Refused Recommended Disposition /[] [facility]/ []  Follow-up with PCP Additional Notes: Pt complaining of back pain that started Sunday. Pain is lower/across hips on back. Pt has bad allergies and was having a sneezing frenzy when "I threw my back out." Pt has been treating the pain with heating pad and OTC pain medication with no relief. Pt rated pain 7/10. Pt denies pain radiating. Pt has appt with PCP today at 1340. RN gave care advice and pt verbalized understanding.            Copied from CRM [id number]. Topic: Clinical - Red Word Triage >> Apr 07, 2023 11:20 AM Sarah [name] wrote: Red Word that prompted transfer to Nurse Triage: Severe back pain and cannot really walk Reason for Disposition  [1] SEVERE back pain (e.g., excruciating, unable to do any normal activities) AND [2] not improved 2 hours after pain medicine  Answer Assessment - Initial Assessment Questions 1. ONSET: "When did the pain begin?"      Sunday  2. LOCATION: "Where does it hurt?" (upper, mid or lower back)     Across hips on back  3. SEVERITY: "How bad is the pain?"  (e.g., Scale 1-10; mild, moderate, or severe)   - MILD (1-3): Doesn't interfere with normal activities.    - MODERATE (4-7): Interferes with normal activities or awakens from sleep.    - SEVERE (8-10): Excruciating pain, unable to do any normal activities.      7 4. PATTERN: "Is the pain constant?" (e.g., yes, no; constant, intermittent)      Constant  5. RADIATION: "Does the pain shoot into your legs or somewhere else?"     Denies  6. CAUSE:  "What do you think is causing the back pain?"      Sneezing   8. MEDICINES: "What have you taken so far for the pain?" (e.g., nothing, acetaminophen, NSAIDS)     Tylenol  9.  NEUROLOGIC SYMPTOMS: "Do you have any weakness, numbness, or problems with bowel/bladder control?"     Denies  10. OTHER SYMPTOMS: "Do you have any other symptoms?" (e.g., fever, abdomen pain, burning with urination, blood in urine)       Denies  Protocols used: Back Pain-A-AH

## 2023-04-07 NOTE — Assessment & Plan Note (Signed)
 Acute on chronic episode of lower back pain. History of surgery in 2005. No red flags today. Currently takes hydrocortisone daily for other chronic issues.  Will avoid steroid taper. Provide Kenalog 40 MG IM in office today.  Order for Norco 5-325 MG Q8H as needed only, have advised her not to take this at same time as Lyrica/Belsomra/or Baclofen and to try to use minimally.  Lidocaine patches sent in.  Will update lower back imaging to further assess.  Return in one week.  If ongoing may need PT or visit with Dr. Yves Dill.

## 2023-04-07 NOTE — Progress Notes (Signed)
 BP 126/64 (BP Location: Left Arm, Patient Position: Sitting, Cuff Size: Large)   Pulse 94   Temp 98 F (36.7 C) (Oral)   Ht 5' 4.5" (1.638 m)   Wt 232 lb 6.4 oz (105.4 kg)   SpO2 98%   BMI 39.28 kg/m    Subjective:    Patient ID: Sarah [name], female    DOB: 1950/12/12, 73 y.o.   MRN: [id number]  HPI: Sarah [name] is a 73 y.o. female  Chief Complaint  Patient presents with   Back Pain    Patient states she has been having bilateral low back pain for the last week, got worse on Sunday. States she does not have any known injuries but thinks she may have hurt it from coughing and sneezing. States the pain is constant.    BACK PAIN Presents for lower back pain.  Has history of surgery to lower back in 2005.  Has been hurting for 2 weeks, but this became worse on Sunday.  Has allergies and sneezing and coughing made pain worse.  Last A1c 6.6%.    Has seen Dr. Yves Dill in past for steroid injections, cervical spine with herniated discs.  Last lumbar spine imaging in 2017 noted lumbar facet hypertrophy, R>L at L4. Duration: weeks Mechanism of injury: no trauma Location: bilateral and low back Onset: sudden Severity: 9/10 Quality: sharp, aching, and throbbing Frequency: constant Radiation: none Aggravating factors: lifting, movement, walking, bending, and coughing Alleviating factors: heating pad Status: fluctuating Treatments attempted: lying down, heating pad, Tylenol Relief with NSAIDs?: No NSAIDs Taken Nighttime pain:  yes Paresthesias / decreased sensation:  no Bowel / bladder incontinence:  no Fevers:  no Dysuria / urinary frequency:  no   Relevant past medical, surgical, family and social history reviewed and updated as indicated. Interim medical history since our last visit reviewed. Allergies and medications reviewed and updated.  Review of Systems  Constitutional:  Negative for activity change, appetite change, diaphoresis, fatigue and fever.  Respiratory:   Negative for cough, chest tightness, shortness of breath and wheezing.   Cardiovascular:  Negative for chest pain, palpitations and leg swelling.  Gastrointestinal: Negative.   Genitourinary: Negative.   Musculoskeletal:  Positive for back pain.  Neurological: Negative.   Psychiatric/Behavioral: Negative.      Per HPI unless specifically indicated above     Objective:    BP 126/64 (BP Location: Left Arm, Patient Position: Sitting, Cuff Size: Large)   Pulse 94   Temp 98 F (36.7 C) (Oral)   Ht 5' 4.5" (1.638 m)   Wt 232 lb 6.4 oz (105.4 kg)   SpO2 98%   BMI 39.28 kg/m   Wt Readings from Last 3 Encounters:  04/07/23 232 lb 6.4 oz (105.4 kg)  12/30/22 233 lb 3.2 oz (105.8 kg)  09/29/22 220 lb (99.8 kg)    Physical Exam Vitals and nursing note reviewed.  Constitutional:      General: She is awake. She is not in acute distress.    Appearance: Normal appearance. She is well-groomed. She is obese. She is not ill-appearing or toxic-appearing.  HENT:     Head: Normocephalic and atraumatic.     Right Ear: Hearing and external ear normal.     Left Ear: Hearing and external ear normal.  Eyes:     Conjunctiva/sclera: Conjunctivae normal.  Neck:     Thyroid: No thyromegaly.     Vascular: No carotid bruit.  Cardiovascular:     Rate and Rhythm: Normal rate and  regular rhythm.     Heart sounds: Normal heart sounds. No murmur heard.    No gallop.  Pulmonary:     Effort: Pulmonary effort is normal. No accessory muscle usage or respiratory distress.     Breath sounds: Normal breath sounds. No stridor. No decreased breath sounds, wheezing or rhonchi.  Abdominal:     General: Bowel sounds are normal. There is no distension.     Palpations: Abdomen is soft.     Tenderness: There is no abdominal tenderness.  Musculoskeletal:     Cervical back: Full passive range of motion without pain and neck supple.     Lumbar back: Tenderness (midline) present. No swelling, spasms or bony  tenderness. Decreased range of motion. Positive right straight leg raise test. Negative left straight leg raise test.     Right lower leg: Edema (trace) present.     Left lower leg: Edema (trace) present.     Comments: Decreased full ROM on exam.  No rashes noted.    Lymphadenopathy:     Cervical: No cervical adenopathy.  Skin:    General: Skin is warm.  Neurological:     General: No focal deficit present.     Mental Status: She is alert and oriented to person, place, and time. Mental status is at baseline.     Deep Tendon Reflexes: Reflexes are normal and symmetric.     Reflex Scores:      Brachioradialis reflexes are 2+ on the right side and 2+ on the left side.      Patellar reflexes are 2+ on the right side and 2+ on the left side. Psychiatric:        Attention and Perception: Attention normal.        Mood and Affect: Mood normal.        Speech: Speech normal.        Behavior: Behavior normal. Behavior is cooperative.        Thought Content: Thought content normal.     Results for orders placed or performed in visit on 12/30/22  Bayer DCA Hb A1c Waived   Collection Time: 12/30/22  1:25 PM  Result Value Ref Range   HB A1C (BAYER DCA - WAIVED) 6.6 (H) 4.8 - 5.6 %  Comprehensive metabolic panel   Collection Time: 12/30/22  1:28 PM  Result Value Ref Range   Glucose 109 (H) 70 - 99 mg/dL   BUN 13 8 - 27 mg/dL   Creatinine, Ser 4.09 (H) 0.57 - 1.00 mg/dL   eGFR 60 >81 XB/JYN/8.29   BUN/Creatinine Ratio 13 12 - 28   Sodium 142 134 - 144 mmol/L   Potassium 4.3 3.5 - 5.2 mmol/L   Chloride 102 96 - 106 mmol/L   CO2 22 20 - 29 mmol/L   Calcium 8.8 8.7 - 10.3 mg/dL   Total Protein 6.0 6.0 - 8.5 g/dL   Albumin 4.1 3.8 - 4.8 g/dL   Globulin, Total 1.9 1.5 - 4.5 g/dL   Bilirubin Total 0.4 0.0 - 1.2 mg/dL   Alkaline Phosphatase 75 44 - 121 IU/L   AST 10 0 - 40 IU/L   ALT 12 0 - 32 IU/L  Lipid Panel w/o Chol/HDL Ratio   Collection Time: 12/30/22  1:28 PM  Result Value Ref Range    Cholesterol, Total 140 100 - 199 mg/dL   Triglycerides 562 (H) 0 - 149 mg/dL   HDL 51 >13 mg/dL   VLDL Cholesterol Cal 30 5 - 40 mg/dL  LDL Chol Calc (NIH) 59 0 - 99 mg/dL      Assessment & Plan:   Problem List Items Addressed This Visit       Other   Chronic lower back pain - Primary   Acute on chronic episode of lower back pain. History of surgery in 2005. No red flags today. Currently takes hydrocortisone daily for other chronic issues.  Will avoid steroid taper. Provide Kenalog 40 MG IM in office today.  Order for Norco 5-325 MG Q8H as needed only, have advised her not to take this at same time as Lyrica/Belsomra/or Baclofen and to try to use minimally.  Lidocaine patches sent in.  Will update lower back imaging to further assess.  Return in one week.  If ongoing may need PT or visit with Dr. Yves Dill.      Relevant Medications   lidocaine (LIDODERM) 5 %   HYDROcodone-acetaminophen (NORCO/VICODIN) 5-325 MG tablet   Other Relevant Orders   DG Lumbar Spine Complete     Follow up plan: Return in about 1 week (around 04/14/2023) for Back Pain + Diabetes visit (missed this on 03/28/23).

## 2023-04-11 NOTE — Patient Instructions (Signed)

## 2023-04-12 ENCOUNTER — Encounter: Payer: Self-pay | Admitting: Nurse Practitioner

## 2023-04-13 ENCOUNTER — Ambulatory Visit
Admission: RE | Admit: 2023-04-13 | Discharge: 2023-04-13 | Disposition: A | Discharge status: Home / Self Care | Attending: Nurse Practitioner | Admitting: Nurse Practitioner

## 2023-04-13 ENCOUNTER — Ambulatory Visit
Admission: RE | Admit: 2023-04-13 | Discharge: 2023-04-13 | Disposition: A | Discharge status: Home / Self Care | Source: Ambulatory Visit | Attending: Nurse Practitioner | Admitting: Nurse Practitioner

## 2023-04-13 DIAGNOSIS — G8929 Other chronic pain: Secondary | ICD-10-CM | POA: Diagnosis present

## 2023-04-13 DIAGNOSIS — M545 Low back pain, unspecified: Secondary | ICD-10-CM

## 2023-04-15 ENCOUNTER — Ambulatory Visit (INDEPENDENT_AMBULATORY_CARE_PROVIDER_SITE_OTHER): Admitting: Nurse Practitioner

## 2023-04-15 ENCOUNTER — Encounter: Payer: Self-pay | Admitting: Nurse Practitioner

## 2023-04-15 VITALS — BP 127/74 | HR 76 | Temp 97.6°F | Ht 64.4 in | Wt 231.6 lb

## 2023-04-15 DIAGNOSIS — E1159 Type 2 diabetes mellitus with other circulatory complications: Secondary | ICD-10-CM

## 2023-04-15 DIAGNOSIS — E119 Type 2 diabetes mellitus without complications: Secondary | ICD-10-CM

## 2023-04-15 DIAGNOSIS — F99 Mental disorder, not otherwise specified: Secondary | ICD-10-CM

## 2023-04-15 DIAGNOSIS — Z7984 Long term (current) use of oral hypoglycemic drugs: Secondary | ICD-10-CM

## 2023-04-15 DIAGNOSIS — G8929 Other chronic pain: Secondary | ICD-10-CM

## 2023-04-15 DIAGNOSIS — E271 Primary adrenocortical insufficiency: Secondary | ICD-10-CM

## 2023-04-15 DIAGNOSIS — F3341 Major depressive disorder, recurrent, in partial remission: Secondary | ICD-10-CM

## 2023-04-15 DIAGNOSIS — E559 Vitamin D deficiency, unspecified: Secondary | ICD-10-CM

## 2023-04-15 DIAGNOSIS — E1169 Type 2 diabetes mellitus with other specified complication: Secondary | ICD-10-CM

## 2023-04-15 DIAGNOSIS — G6289 Other specified polyneuropathies: Secondary | ICD-10-CM

## 2023-04-15 DIAGNOSIS — E538 Deficiency of other specified B group vitamins: Secondary | ICD-10-CM

## 2023-04-15 DIAGNOSIS — M5442 Lumbago with sciatica, left side: Secondary | ICD-10-CM

## 2023-04-15 DIAGNOSIS — F5105 Insomnia due to other mental disorder: Secondary | ICD-10-CM

## 2023-04-15 DIAGNOSIS — E66812 Obesity, class 2: Secondary | ICD-10-CM

## 2023-04-15 LAB — BAYER DCA HB A1C WAIVED: HB A1C (BAYER DCA - WAIVED): 6.9 % — ABNORMAL HIGH (ref 4.8–5.6)

## 2023-04-15 MED ORDER — LOSARTAN POTASSIUM 50 MG PO TABS: 50.0000 mg | ORAL_TABLET | Freq: Every day | ORAL | 3 refills | Status: DC

## 2023-04-15 MED ORDER — HYDROCODONE-ACETAMINOPHEN 5-325 MG PO TABS: 1.0000 | ORAL_TABLET | Freq: Three times a day (TID) | ORAL | 0 refills | Status: AC | PRN

## 2023-04-15 MED ORDER — AMLODIPINE BESYLATE 2.5 MG PO TABS: 2.5000 mg | ORAL_TABLET | Freq: Every day | ORAL | 3 refills | Status: DC

## 2023-04-15 MED ORDER — ROSUVASTATIN CALCIUM 10 MG PO TABS: 10.0000 mg | ORAL_TABLET | Freq: Every day | ORAL | 3 refills | Status: DC

## 2023-04-15 MED ORDER — METFORMIN HCL 1000 MG PO TABS: 1000.0000 mg | ORAL_TABLET | Freq: Two times a day (BID) | ORAL | 4 refills | Status: DC

## 2023-04-15 NOTE — Assessment & Plan Note (Signed)
 Chronic, ongoing.  Followed by psychiatry who fills Belsomra (Ambien currently due to no supply of Belsomra available), continue current medication regimen as prescribed by them.

## 2023-04-15 NOTE — Assessment & Plan Note (Signed)
 BMI 39.26.  Recommended eating smaller high protein, low fat meals more frequently and exercising 30 mins a day 5 times a week with a goal of 10-15lb weight loss in the next 3 months. Patient voiced their understanding and motivation to adhere to these recommendations.

## 2023-04-15 NOTE — Assessment & Plan Note (Signed)
 Chronic, ongoing with A1c 6.9% today, urine ALB 29 March 2022 -- she will bring in sample .  Taking Metformin 1000 MG BID and tolerating, continue this.  Recommend check BS fasting every morning and document for visits.  Will plan on recheck of A1c in 3 months.  Recommend heavy focus on diabetic diet at home and regular exercise. - Eye exam up to date. Will get records. - Foot exam up to date - ARB and Statin on board - Pneumococcal and Flu vaccines up to date.

## 2023-04-15 NOTE — Assessment & Plan Note (Signed)
Chronic, stable.  Denies SI/HI.  Followed by psychiatry -- recent notes reviewed and will maintain current medication regimen as prescribed by them. 

## 2023-04-15 NOTE — Assessment & Plan Note (Signed)
 Acute on chronic episode of lower back pain which is ongoing. Waiting on imaging report, but on review of imaging appears to have hardware intact but some disc space loss present. History of surgery in 2005. No red flags today. Currently takes hydrocortisone daily for other chronic issues.  Will avoid steroid taper. Refills on Norco 5-325 MG Q8H as needed only, have advised her not to take this at same time as Lyrica/Belsomra/or Baclofen and to try to use minimally.  Lidocaine patches at home.  Referral to Dr. Yves Dill at physiatry who she has seen before for neck issues and had benefit.

## 2023-04-15 NOTE — Progress Notes (Signed)
 BP 127/74 (BP Location: Right Arm, Cuff Size: Normal)   Pulse 76   Temp 97.6 F (36.4 C) (Oral)   Ht 5' 4.4" (1.636 m)   Wt 231 lb 9.6 oz (105.1 kg)   SpO2 98%   BMI 39.26 kg/m    Subjective:    Patient ID: Sarah [name], female    DOB: [birth year], 73 y.o.   MRN: [id number]  HPI: Dian Laprade is a 73 y.o. female  Chief Complaint  Patient presents with   Back Pain   Diabetes   Hyperlipidemia   Hypertension   DIABETES A1c 6.6% December.  Continues Metformin 1000 MG BID.  Prior to back hurting she was performing 20 minutes every other day of exercise at [facility].  Was seen for back pain on 04/07/23, imaging performed of lumbar spine, not resulted as of yet.  Norco helped somewhat.  Pain is still present, 7/10.    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   Hypoglycemic episodes:no Polydipsia/polyuria: no Visual disturbance: no Chest pain: no Paresthesias: no Glucose Monitoring: yes  Accucheck frequency: occasional  Fasting glucose: 150 recently  Post prandial:  Evening:   Before meals: Taking Insulin?: no  Long acting insulin:  Short acting insulin: Blood Pressure Monitoring: not checking Retinal Examination: Up to Date -- March 2024 [facility] Foot Exam: Up to Date Diabetic Education: Not Completed Pneumovax: Up to Date Influenza: Up to Date Aspirin: no   HYPERTENSION / HYPERLIPIDEMIA Continues Losartan, Amlodipine, and Rosuvastatin + Lasix.  Satisfied with current treatment? yes Duration of hypertension: years BP monitoring frequency: occasional BP range: <130/80 on average BP medication side effects: no Duration of hyperlipidemia: years Cholesterol medication side effects: no Cholesterol supplements: none Medication compliance: good compliance Aspirin: no Recent stressors: yes Recurrent headaches: no Visual  changes: no Palpitations: no Dyspnea: no Chest pain: no Lower extremity edema: yes - lymphedema, wears compression hose  HISTORY OF MELANOMA [facility] every 6 months for history malignant melanoma in 2018, last visit on 04/09/21.  Next scheduled visit May 2025.  Oncology seen 08/25/21, is to return to them as needed. Saw neurology for neuropathy last on 12/03/22.  Continues Pregabalin and to have yearly visits. History of skin cancer: yes History of precancerous skin lesions: yes Family history of skin cancer: no   DEPRESSION Follows with psychiatry, last visit on 02/15/23. Continues Amitriptyline, Lexapro, Belsomra. Mood status: stable Satisfied with current treatment?: yes Symptom severity: moderate  Duration of current treatment : chronic Side effects: no Medication compliance: good compliance Psychotherapy/counseling: yes in the past Depressed mood: no Anxious mood: no Anhedonia: no Significant weight loss or gain: no Insomnia: no Fatigue: occasional Feelings of worthlessness or guilt: no Impaired concentration/indecisiveness: no Suicidal ideations: no Hopelessness: no Crying spells: no    04/15/2023    9:30 AM 04/07/2023    1:42 PM 12/30/2022    1:21 PM 09/29/2022    3:50 PM 09/23/2022   10:40 AM  Depression screen PHQ 2/9  Decreased Interest 1 1 0 0 0  Down, Depressed, Hopeless 0 1 0 0 0  PHQ - 2 Score 1 2 0 0 0  Altered sleeping 1 1 0 0 0  Tired, decreased energy 1 1 0 0 0  Change in appetite 0 1 0 0 0  Feeling bad or failure about yourself  0 0 0 0 0  Trouble  concentrating 0 0 0 0 0  Moving slowly or fidgety/restless 0 0 0 0 0  Suicidal thoughts 0 0 0 0 0  PHQ-9 Score 3 5 0 0 0  Difficult doing work/chores Not difficult at all Somewhat difficult Not difficult at all Not difficult at all Not difficult at all       04/15/2023    9:30 AM 04/07/2023    1:42 PM 12/30/2022    1:23 PM 09/23/2022   10:40 AM  GAD 7 : Generalized Anxiety Score  Nervous, Anxious, on  Edge 0 1 1 0  Control/stop worrying 0 0 0 0  Worry too much - different things 0 0 0 0  Trouble relaxing 0 0 0 0  Restless 0 0 0 0  Easily annoyed or irritable 0 0 0 0  Afraid - awful might happen 0 1 0 0  Total GAD 7 Score 0 2 1 0  Anxiety Difficulty Not difficult at all Somewhat difficult Not difficult at all Not difficult at all   Relevant past medical, surgical, family and social history reviewed and updated as indicated. Interim medical history since our last visit reviewed. Allergies and medications reviewed and updated.  Review of Systems  Constitutional:  Negative for activity change, appetite change, diaphoresis, fatigue and fever.  Respiratory:  Negative for cough, chest tightness and shortness of breath.   Cardiovascular:  Positive for leg swelling (at baseline, wears compression). Negative for chest pain and palpitations.  Gastrointestinal: Negative.   Endocrine: Negative for polydipsia, polyphagia and polyuria.  Musculoskeletal:  Positive for back pain.  Neurological:  Negative for dizziness, syncope, weakness, light-headedness, numbness and headaches.  Psychiatric/Behavioral: Negative.     Per HPI unless specifically indicated above     Objective:    BP 127/74 (BP Location: Right Arm, Cuff Size: Normal)   Pulse 76   Temp 97.6 F (36.4 C) (Oral)   Ht 5' 4.4" (1.636 m)   Wt 231 lb 9.6 oz (105.1 kg)   SpO2 98%   BMI 39.26 kg/m   Wt Readings from Last 3 Encounters:  04/15/23 231 lb 9.6 oz (105.1 kg)  04/07/23 232 lb 6.4 oz (105.4 kg)  12/30/22 233 lb 3.2 oz (105.8 kg)    Physical Exam Vitals and nursing note reviewed.  Constitutional:      General: She is awake. She is not in acute distress.    Appearance: Normal appearance. She is well-groomed. She is obese. She is not ill-appearing or toxic-appearing.  HENT:     Head: Normocephalic and atraumatic.     Right Ear: Hearing and external ear normal.     Left Ear: Hearing and external ear normal.  Eyes:      Conjunctiva/sclera: Conjunctivae normal.  Neck:     Thyroid: No thyromegaly.     Vascular: No carotid bruit.  Cardiovascular:     Rate and Rhythm: Normal rate and regular rhythm.     Heart sounds: Normal heart sounds. No murmur heard.    No gallop.  Pulmonary:     Effort: Pulmonary effort is normal. No accessory muscle usage or respiratory distress.     Breath sounds: Normal breath sounds. No stridor. No decreased breath sounds, wheezing or rhonchi.  Abdominal:     General: Bowel sounds are normal. There is no distension.     Palpations: Abdomen is soft.     Tenderness: There is no abdominal tenderness.  Musculoskeletal:     Cervical back: Full passive range of motion without  pain and neck supple.     Right lower leg: Edema (trace) present.     Left lower leg: Edema (trace) present.  Lymphadenopathy:     Cervical: No cervical adenopathy.  Skin:    General: Skin is warm.  Neurological:     General: No focal deficit present.     Mental Status: She is alert and oriented to person, place, and time. Mental status is at baseline.     Cranial Nerves: Cranial nerves 2-12 are intact.     Coordination: Coordination is intact.     Gait: Gait is intact.     Deep Tendon Reflexes: Reflexes are normal and symmetric.     Reflex Scores:      Brachioradialis reflexes are 2+ on the right side and 2+ on the left side.      Patellar reflexes are 2+ on the right side and 2+ on the left side. Psychiatric:        Attention and Perception: Attention normal.        Mood and Affect: Mood normal.        Speech: Speech normal.        Behavior: Behavior normal. Behavior is cooperative.        Thought Content: Thought content normal.    Results for orders placed or performed in visit on 04/15/23  Bayer DCA Hb A1c Waived   Collection Time: 04/15/23  9:29 AM  Result Value Ref Range   HB A1C (BAYER DCA - WAIVED) 6.9 (H) 4.8 - 5.6 %      Assessment & Plan:   Problem List Items Addressed This Visit        Cardiovascular and Mediastinum   Hypertension associated with diabetes (HCC)   Chronic, ongoing.  BP at goal today for age. Continue Losartan 50 MG daily and Lasix 40 MG daily +  Amlodipine at 2.5 MG (suspect more ISH present). Recommend she monitor BP at least a few mornings a week at home and document.  DASH diet at home.  Refills as needed.  Losartan for kidney protection.  LABS: CBC, TSH, CMP.  Return in 3 months.       Relevant Medications   amLODipine (NORVASC) 2.5 MG tablet   losartan (COZAAR) 50 MG tablet   metFORMIN (GLUCOPHAGE) 1000 MG tablet   rosuvastatin (CRESTOR) 10 MG tablet   Other Relevant Orders   Bayer DCA Hb A1c Waived (Completed)   CBC with Differential/Platelet   Comprehensive metabolic panel   TSH   Microalbumin, Urine Waived     Endocrine   Adrenal insufficiency (Addison's disease) (HCC)   Chronic, ongoing.  Followed by endocrinology, continue current regimen as ordered by them.  Recent notes reviewed.      Diabetes mellitus treated with oral medication (HCC)   Refer to diabetes with obesity plan of care.      Relevant Medications   losartan (COZAAR) 50 MG tablet   metFORMIN (GLUCOPHAGE) 1000 MG tablet   rosuvastatin (CRESTOR) 10 MG tablet   Other Relevant Orders   Bayer DCA Hb A1c Waived (Completed)   Hyperlipidemia associated with type 2 diabetes mellitus (HCC)   Chronic, ongoing.  Continue Crestor and adjust as needed.  CMP and Lipid check today.      Relevant Medications   amLODipine (NORVASC) 2.5 MG tablet   losartan (COZAAR) 50 MG tablet   metFORMIN (GLUCOPHAGE) 1000 MG tablet   rosuvastatin (CRESTOR) 10 MG tablet   Other Relevant Orders   Bayer DCA Hb  A1c Waived (Completed)   Comprehensive metabolic panel   Lipid Panel w/o Chol/HDL Ratio   Type 2 diabetes mellitus with obesity (HCC) - Primary   Chronic, ongoing with A1c 6.9% today, urine ALB 29 March 2022 -- she will bring in sample .  Taking Metformin 1000 MG BID and tolerating,  continue this.  Recommend check BS fasting every morning and document for visits.  Will plan on recheck of A1c in 3 months.  Recommend heavy focus on diabetic diet at home and regular exercise. - Eye exam up to date. Will get records. - Foot exam up to date - ARB and Statin on board - Pneumococcal and Flu vaccines up to date.      Relevant Medications   losartan (COZAAR) 50 MG tablet   metFORMIN (GLUCOPHAGE) 1000 MG tablet   rosuvastatin (CRESTOR) 10 MG tablet   Other Relevant Orders   Bayer DCA Hb A1c Waived (Completed)   Microalbumin, Urine Waived     Nervous and Auditory   Axonal sensorimotor neuropathy   Followed by neurology, recent notes reviewed.  Continue current medication regimen as prescribed by them. She is utilizing gym at [facility].        Other   B12 deficiency   History of lower levels, check today and continue supplement.      Relevant Orders   Vitamin B12   Chronic lower back pain   Acute on chronic episode of lower back pain which is ongoing. Waiting on imaging report, but on review of imaging appears to have hardware intact but some disc space loss present. History of surgery in 2005. No red flags today. Currently takes hydrocortisone daily for other chronic issues.  Will avoid steroid taper. Refills on Norco 5-325 MG Q8H as needed only, have advised her not to take this at same time as Lyrica/Belsomra/or Baclofen and to try to use minimally.  Lidocaine patches at home.  Referral to Dr. Yves Dill at physiatry who she has seen before for neck issues and had benefit.      Relevant Medications   HYDROcodone-acetaminophen (NORCO/VICODIN) 5-325 MG tablet   Other Relevant Orders   Ambulatory referral to Pain Clinic   Insomnia due to other mental disorder   Chronic, ongoing.  Followed by psychiatry who fills Belsomra (Ambien currently due to no supply of Belsomra available), continue current medication regimen as prescribed by them.      Obesity   BMI 39.26.   Recommended eating smaller high protein, low fat meals more frequently and exercising 30 mins a day 5 times a week with a goal of 10-15lb weight loss in the next 3 months. Patient voiced their understanding and motivation to adhere to these recommendations.       Relevant Medications   metFORMIN (GLUCOPHAGE) 1000 MG tablet   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HCC)   Chronic, stable.  Denies SI/HI.  Followed by psychiatry -- recent notes reviewed and will maintain current medication regimen as prescribed by them.      Vitamin D deficiency   Ongoing with osteopenia. Noted past labs, recommend taking Vitamin D3 2000 units daily and recheck level today.      Relevant Orders   VITAMIN D 25 Hydroxy (Vit-D Deficiency, Fractures)     Follow up plan: Return in about 3 months (around 07/16/2023) for T2DM, HTN/HLD.

## 2023-04-15 NOTE — Assessment & Plan Note (Signed)
 Refer to diabetes with obesity plan of care.

## 2023-04-15 NOTE — Assessment & Plan Note (Signed)
Chronic, ongoing.  Continue Crestor and adjust as needed.  CMP and Lipid check today.

## 2023-04-15 NOTE — Assessment & Plan Note (Signed)
Ongoing with osteopenia. Noted past labs, recommend taking Vitamin D3 2000 units daily and recheck level today.

## 2023-04-15 NOTE — Assessment & Plan Note (Signed)
 Followed by neurology, recent notes reviewed.  Continue current medication regimen as prescribed by them. She is utilizing gym at [facility].

## 2023-04-15 NOTE — Assessment & Plan Note (Signed)
Chronic, ongoing.  Followed by endocrinology, continue current regimen as ordered by them.  Recent notes reviewed. 

## 2023-04-15 NOTE — Assessment & Plan Note (Signed)
 Chronic, ongoing.  BP at goal today for age. Continue Losartan 50 MG daily and Lasix 40 MG daily +  Amlodipine at 2.5 MG (suspect more ISH present). Recommend she monitor BP at least a few mornings a week at home and document.  DASH diet at home.  Refills as needed.  Losartan for kidney protection.  LABS: CBC, TSH, CMP.  Return in 3 months.

## 2023-04-15 NOTE — Assessment & Plan Note (Signed)
History of lower levels, check today and continue supplement.

## 2023-04-16 ENCOUNTER — Encounter: Payer: Self-pay | Admitting: Nurse Practitioner

## 2023-04-16 LAB — COMPREHENSIVE METABOLIC PANEL WITH GFR
ALT: 19 IU/L (ref 0–32)
AST: 15 IU/L (ref 0–40)
Albumin: 4.1 g/dL (ref 3.8–4.8)
Alkaline Phosphatase: 79 IU/L (ref 44–121)
BUN/Creatinine Ratio: 20 (ref 12–28)
BUN: 22 mg/dL (ref 8–27)
Bilirubin Total: 0.3 mg/dL (ref 0.0–1.2)
CO2: 20 mmol/L (ref 20–29)
Calcium: 9.1 mg/dL (ref 8.7–10.3)
Chloride: 102 mmol/L (ref 96–106)
Creatinine, Ser: 1.11 mg/dL — ABNORMAL HIGH (ref 0.57–1.00)
Globulin, Total: 2 g/dL (ref 1.5–4.5)
Glucose: 180 mg/dL — ABNORMAL HIGH (ref 70–99)
Potassium: 4.2 mmol/L (ref 3.5–5.2)
Sodium: 140 mmol/L (ref 134–144)
Total Protein: 6.1 g/dL (ref 6.0–8.5)
eGFR: 53 mL/min/{1.73_m2} — ABNORMAL LOW (ref >59)

## 2023-04-16 LAB — CBC WITH DIFFERENTIAL/PLATELET
Basophils Absolute: 0.1 10*3/uL (ref 0.0–0.2)
Basos: 1 %
EOS (ABSOLUTE): 0.4 10*3/uL (ref 0.0–0.4)
Eos: 5 %
Hematocrit: 38.6 % (ref 34.0–46.6)
Hemoglobin: 12.6 g/dL (ref 11.1–15.9)
Immature Grans (Abs): 0.1 10*3/uL (ref 0.0–0.1)
Immature Granulocytes: 1 %
Lymphocytes Absolute: 2.2 10*3/uL (ref 0.7–3.1)
Lymphs: 25 %
MCH: 28.3 pg (ref 26.6–33.0)
MCHC: 32.6 g/dL (ref 31.5–35.7)
MCV: 87 fL (ref 79–97)
Monocytes Absolute: 0.5 10*3/uL (ref 0.1–0.9)
Monocytes: 6 %
Neutrophils Absolute: 5.4 10*3/uL (ref 1.4–7.0)
Neutrophils: 62 %
Platelets: 245 10*3/uL (ref 150–450)
RBC: 4.46 x10E6/uL (ref 3.77–5.28)
RDW: 13.2 % (ref 11.7–15.4)
WBC: 8.7 10*3/uL (ref 3.4–10.8)

## 2023-04-16 LAB — LIPID PANEL W/O CHOL/HDL RATIO
Cholesterol, Total: 140 mg/dL (ref 100–199)
HDL: 58 mg/dL (ref >39)
LDL Chol Calc (NIH): 62 mg/dL (ref 0–99)
Triglycerides: 108 mg/dL (ref 0–149)
VLDL Cholesterol Cal: 20 mg/dL (ref 5–40)

## 2023-04-16 LAB — TSH: TSH: 2.65 u[IU]/mL (ref 0.450–4.500)

## 2023-04-16 LAB — VITAMIN B12: Vitamin B-12: 1867 pg/mL — ABNORMAL HIGH (ref 232–1245)

## 2023-04-16 LAB — VITAMIN D 25 HYDROXY (VIT D DEFICIENCY, FRACTURES): Vit D, 25-Hydroxy: 45.7 ng/mL (ref 30.0–100.0)

## 2023-04-16 NOTE — Progress Notes (Signed)
 Contacted via MyChart   Good evening Sarah [name], your blood work has returned: - Kidney function, creatinine and eGFR, continues to show Stage 3a kidney disease.  Liver function normal. - B12 a little too high.  I recommend cut back on B12 supplement to every other day. - Remainder of labs stable.  Any questions? Keep being amazing!!  Thank you for allowing me to participate in your care.  I appreciate you. Kindest regards, Levada Bowersox

## 2023-04-16 NOTE — Addendum Note (Signed)
 Addended by: Marjie Skiff on: 04/16/2023 08:13 PM   Modules accepted: Orders

## 2023-04-16 NOTE — Progress Notes (Signed)
 Contacted via MyChart   Good evening Sarah [name], your imaging results have returned and there is some concern for possible loosening of a screw.  They recommend more imaging with you in different positions to further assess.  I have placed the order for this and you can get them done at address below: IMAGING LOCATION: [address], [place], [place] [address] [phone]

## 2023-05-10 ENCOUNTER — Other Ambulatory Visit: Payer: Self-pay | Admitting: Nurse Practitioner

## 2023-05-11 NOTE — Telephone Encounter (Signed)
 Requested by interface surescripts. Medication dose discontinued 06/17/22.  Requested Prescriptions  Refused Prescriptions Disp Refills   metFORMIN  (GLUCOPHAGE ) 500 MG tablet [Pharmacy Med Name: METFORMIN  HCL 500 MG TABLET] 360 tablet 2    Sig: START OUT TAKING ONE TABLET (500 MG) BY MOUTH TWICE A DAY, IF IN ONE WEEK TOLERATING INCREASE TO 1000 MG (2 TABLETS) IN MORNING AND 500 MG (1 TABLET) IN EVENING BY MOUTH, THEN IF TOLERATING THIS AFTER ONE WEEK INCREASE TO 1000 MG (2 TABLETS) TWICE A DAY BY MOUTH.     Endocrinology:  Diabetes - Biguanides Failed - 05/11/2023  1:32 PM      Failed - Cr in normal range and within 360 days    Creat  Date Value Ref Range Status  04/23/2020 0.89 0.50 - 0.99 mg/dL Final    Comment:    For patients >70 years of age, the reference limit for Creatinine is approximately 13% higher for people identified as African-American. .    Creatinine, Ser  Date Value Ref Range Status  04/15/2023 1.11 (H) 0.57 - 1.00 mg/dL Final   Creatinine, Urine  Date Value Ref Range Status  01/10/2018 14 (L) 20 - 275 mg/dL Final         Failed - eGFR in normal range and within 360 days    GFR, Est African American  Date Value Ref Range Status  04/23/2020 77 > OR = 60 mL/min/1.38m2 Final   GFR, Est Non African American  Date Value Ref Range Status  04/23/2020 67 > OR = 60 mL/min/1.15m2 Final   GFR, Estimated  Date Value Ref Range Status  11/12/2021 59 (L) >60 mL/min Final    Comment:    (NOTE) Calculated using the CKD-EPI Creatinine Equation (2021)    eGFR  Date Value Ref Range Status  04/15/2023 53 (L) >59 mL/min/1.73 Final         Failed - B12 Level in normal range and within 720 days    Vitamin B-12  Date Value Ref Range Status  04/15/2023 1,867 (H) 232 - 1,245 pg/mL Final         Failed - Valid encounter within last 6 months    Recent Outpatient Visits           3 weeks ago Type 2 diabetes mellitus with obesity (HCC)   [facility] [place], [place] T, NP   1 month ago Chronic bilateral low back pain without sciatica   [facility] [place], Sanjuan Crumbly T, NP       Future Appointments             In 2 months Elta Halter, MD [facility]            Passed - HBA1C is between 0 and 7.9 and within 180 days    Hemoglobin A1C  Date Value Ref Range Status  06/04/2011 6.3 4.2 - 6.3 % Final    Comment:    The American Diabetes Association recommends that a primary goal of therapy should be <7% and that physicians should reevaluate the treatment regimen in patients with HbA1c values consistently >8%.    HB A1C (BAYER DCA - WAIVED)  Date Value Ref Range Status  04/15/2023 6.9 (H) 4.8 - 5.6 % Final    Comment:             Prediabetes: 5.7 - 6.4          Diabetes: >6.4  Glycemic control for adults with diabetes: <7.0          Passed - CBC within normal limits and completed in the last 12 months    WBC  Date Value Ref Range Status  04/15/2023 8.7 3.4 - 10.8 x10E3/uL Final  11/12/2021 7.8 4.0 - 10.5 K/uL Final   RBC  Date Value Ref Range Status  04/15/2023 4.46 3.77 - 5.28 x10E6/uL Final  11/12/2021 3.89 3.87 - 5.11 MIL/uL Final   Hemoglobin  Date Value Ref Range Status  04/15/2023 12.6 11.1 - 15.9 g/dL Final   Hematocrit  Date Value Ref Range Status  04/15/2023 38.6 34.0 - 46.6 % Final   MCHC  Date Value Ref Range Status  04/15/2023 32.6 31.5 - 35.7 g/dL Final  62/95/2841 32.4 30.0 - 36.0 g/dL Final   [facility]  Date Value Ref Range Status  04/15/2023 28.3 26.6 - 33.0 pg Final  11/12/2021 28.8 26.0 - 34.0 pg Final   MCV  Date Value Ref Range Status  04/15/2023 87 79 - 97 fL Final  06/04/2011 88 80 - 100 fL Final   No results found for: "PLTCOUNTKUC", "LABPLAT", "POCPLA" RDW  Date Value Ref Range Status  04/15/2023 13.2 11.7 - 15.4 % Final  06/04/2011 13.2 11.5 - 14.5 % Final

## 2023-06-02 ENCOUNTER — Other Ambulatory Visit: Payer: Self-pay | Admitting: Nurse Practitioner

## 2023-06-02 DIAGNOSIS — G8929 Other chronic pain: Secondary | ICD-10-CM

## 2023-06-03 NOTE — Telephone Encounter (Signed)
 Requested Prescriptions  Pending Prescriptions Disp Refills   metFORMIN  (GLUCOPHAGE ) 500 MG tablet [Pharmacy Med Name: METFORMIN  HCL 500 MG TABLET] 360 tablet 2    Sig: START OUT TAKING ONE TABLET (500 MG) BY MOUTH TWICE A DAY, IF IN ONE WEEK TOLERATING INCREASE TO 1000 MG (2 TABLETS) IN MORNING AND 500 MG (1 TABLET) IN EVENING BY MOUTH, THEN IF TOLERATING THIS AFTER ONE WEEK INCREASE TO 1000 MG (2 TABLETS) TWICE A DAY BY MOUTH.     Endocrinology:  Diabetes - Biguanides Failed - 06/03/2023  4:16 PM      Failed - Cr in normal range and within 360 days    Creat  Date Value Ref Range Status  04/23/2020 0.89 0.50 - 0.99 mg/dL Final    Comment:    For patients >1 years of age, the reference limit for Creatinine is approximately 13% higher for people identified as African-American. .    Creatinine, Ser  Date Value Ref Range Status  04/15/2023 1.11 (H) 0.57 - 1.00 mg/dL Final   Creatinine, Urine  Date Value Ref Range Status  01/10/2018 14 (L) 20 - 275 mg/dL Final         Failed - eGFR in normal range and within 360 days    GFR, Est African American  Date Value Ref Range Status  04/23/2020 77 > OR = 60 mL/min/1.47m2 Final   GFR, Est Non African American  Date Value Ref Range Status  04/23/2020 67 > OR = 60 mL/min/1.22m2 Final   GFR, Estimated  Date Value Ref Range Status  11/12/2021 59 (L) >60 mL/min Final    Comment:    (NOTE) Calculated using the CKD-EPI Creatinine Equation (2021)    eGFR  Date Value Ref Range Status  04/15/2023 53 (L) >59 mL/min/1.73 Final         Failed - B12 Level in normal range and within 720 days    Vitamin B-12  Date Value Ref Range Status  04/15/2023 1,867 (H) 232 - 1,245 pg/mL Final         Failed - Valid encounter within last 6 months    Recent Outpatient Visits           1 month ago Type 2 diabetes mellitus with obesity (HCC)   [facility] [place], [place] T, NP   1 month ago Chronic bilateral low back pain  without sciatica   [facility] [place], Sanjuan Crumbly T, NP       Future Appointments             In 1 month Elta Halter, MD [facility]            Passed - HBA1C is between 0 and 7.9 and within 180 days    Hemoglobin A1C  Date Value Ref Range Status  06/04/2011 6.3 4.2 - 6.3 % Final    Comment:    The American Diabetes Association recommends that a primary goal of therapy should be <7% and that physicians should reevaluate the treatment regimen in patients with HbA1c values consistently >8%.    HB A1C (BAYER DCA - WAIVED)  Date Value Ref Range Status  04/15/2023 6.9 (H) 4.8 - 5.6 % Final    Comment:             Prediabetes: 5.7 - 6.4          Diabetes: >6.4          Glycemic control for  adults with diabetes: <7.0          Passed - CBC within normal limits and completed in the last 12 months    WBC  Date Value Ref Range Status  04/15/2023 8.7 3.4 - 10.8 x10E3/uL Final  11/12/2021 7.8 4.0 - 10.5 K/uL Final   RBC  Date Value Ref Range Status  04/15/2023 4.46 3.77 - 5.28 x10E6/uL Final  11/12/2021 3.89 3.87 - 5.11 MIL/uL Final   Hemoglobin  Date Value Ref Range Status  04/15/2023 12.6 11.1 - 15.9 g/dL Final   Hematocrit  Date Value Ref Range Status  04/15/2023 38.6 34.0 - 46.6 % Final   MCHC  Date Value Ref Range Status  04/15/2023 32.6 31.5 - 35.7 g/dL Final  16/10/9602 54.0 30.0 - 36.0 g/dL Final   [facility]  Date Value Ref Range Status  04/15/2023 28.3 26.6 - 33.0 pg Final  11/12/2021 28.8 26.0 - 34.0 pg Final   MCV  Date Value Ref Range Status  04/15/2023 87 79 - 97 fL Final  06/04/2011 88 80 - 100 fL Final   No results found for: "PLTCOUNTKUC", "LABPLAT", "POCPLA" RDW  Date Value Ref Range Status  04/15/2023 13.2 11.7 - 15.4 % Final  06/04/2011 13.2 11.5 - 14.5 % Final          lidocaine  (LIDODERM ) 5 % [Pharmacy Med Name: LIDOCAINE  5% PATCH] 30 patch 0    Sig: PLACE 1 PATCH ONTO THE SKIN DAILY.  REMOVE & DISCARD PATCH WITHIN 12 HOURS OR AS DIRECTED BY MD     Analgesics:  Topicals Failed - 06/03/2023  4:16 PM      Failed - Manual Review: Labs are only required if the patient has taken medication for more than 8 weeks.      Failed - Cr in normal range and within 360 days    Creat  Date Value Ref Range Status  04/23/2020 0.89 0.50 - 0.99 mg/dL Final    Comment:    For patients >79 years of age, the reference limit for Creatinine is approximately 13% higher for people identified as African-American. .    Creatinine, Ser  Date Value Ref Range Status  04/15/2023 1.11 (H) 0.57 - 1.00 mg/dL Final   Creatinine, Urine  Date Value Ref Range Status  01/10/2018 14 (L) 20 - 275 mg/dL Final         Failed - Valid encounter within last 12 months    Recent Outpatient Visits           1 month ago Type 2 diabetes mellitus with obesity (HCC)   [place] [facility] [place], [place] T, NP   1 month ago Chronic bilateral low back pain without sciatica   [place] [facility] [place], Sanjuan Crumbly T, NP       Future Appointments             In 1 month Elta Halter, MD [place] [facility]            Passed - PLT in normal range and within 360 days    Platelets  Date Value Ref Range Status  04/15/2023 245 150 - 450 x10E3/uL Final         Passed - HGB in normal range and within 360 days    Hemoglobin  Date Value Ref Range Status  04/15/2023 12.6 11.1 - 15.9 g/dL Final         Passed - HCT in normal range and within  360 days    Hematocrit  Date Value Ref Range Status  04/15/2023 38.6 34.0 - 46.6 % Final         Passed - eGFR is 30 or above and within 360 days    GFR, Est African American  Date Value Ref Range Status  04/23/2020 77 > OR = 60 mL/min/1.75m2 Final   GFR, Est Non African American  Date Value Ref Range Status  04/23/2020 67 > OR = 60 mL/min/1.79m2 Final   GFR, Estimated  Date Value Ref Range Status  11/12/2021  59 (L) >60 mL/min Final    Comment:    (NOTE) Calculated using the CKD-EPI Creatinine Equation (2021)    eGFR  Date Value Ref Range Status  04/15/2023 53 (L) >59 mL/min/1.73 Final         Passed - Patient is not pregnant

## 2023-06-17 ENCOUNTER — Ambulatory Visit: Payer: Medicare Other | Admitting: Dermatology

## 2023-06-26 ENCOUNTER — Other Ambulatory Visit: Payer: Self-pay | Admitting: Nurse Practitioner

## 2023-06-28 NOTE — Telephone Encounter (Signed)
 The original prescription was discontinued on 06/17/2022 by Lemar Pyles, NP for the following reason: Completed Course.    Requested Prescriptions  Pending Prescriptions Disp Refills   metFORMIN  (GLUCOPHAGE ) 500 MG tablet [Pharmacy Med Name: METFORMIN  HCL 500 MG TABLET] 360 tablet 2    Sig: START OUT TAKING ONE TABLET (500 MG) BY MOUTH TWICE A DAY, IF IN ONE WEEK TOLERATING INCREASE TO 1000 MG (2 TABLETS) IN MORNING AND 500 MG (1 TABLET) IN EVENING BY MOUTH, THEN IF TOLERATING THIS AFTER ONE WEEK INCREASE TO 1000 MG (2 TABLETS) TWICE A DAY BY MOUTH.     Endocrinology:  Diabetes - Biguanides Failed - 06/28/2023  4:02 PM      Failed - Cr in normal range and within 360 days    Creat  Date Value Ref Range Status  04/23/2020 0.89 0.50 - 0.99 mg/dL Final    Comment:    For patients >47 years of age, the reference limit for Creatinine is approximately 13% higher for people identified as African-American. .    Creatinine, Ser  Date Value Ref Range Status  04/15/2023 1.11 (H) 0.57 - 1.00 mg/dL Final   Creatinine, Urine  Date Value Ref Range Status  01/10/2018 14 (L) 20 - 275 mg/dL Final         Failed - eGFR in normal range and within 360 days    GFR, Est African American  Date Value Ref Range Status  04/23/2020 77 > OR = 60 mL/min/1.67m2 Final   GFR, Est Non African American  Date Value Ref Range Status  04/23/2020 67 > OR = 60 mL/min/1.62m2 Final   GFR, Estimated  Date Value Ref Range Status  11/12/2021 59 (L) >60 mL/min Final    Comment:    (NOTE) Calculated using the CKD-EPI Creatinine Equation (2021)    eGFR  Date Value Ref Range Status  04/15/2023 53 (L) >59 mL/min/1.73 Final         Failed - B12 Level in normal range and within 720 days    Vitamin B-12  Date Value Ref Range Status  04/15/2023 1,867 (H) 232 - 1,245 pg/mL Final         Failed - Valid encounter within last 6 months    Recent Outpatient Visits           2 months ago Type 2 diabetes  mellitus with obesity (HCC)   [place] [facility] Sarah [name], Sarah [name] T, NP   2 months ago Chronic bilateral low back pain without sciatica   [place] [facility], Sanjuan Crumbly T, NP       Future Appointments             In 3 weeks Elta Halter, MD [facility]            Passed - HBA1C is between 0 and 7.9 and within 180 days    Hemoglobin A1C  Date Value Ref Range Status  06/04/2011 6.3 4.2 - 6.3 % Final    Comment:    The American Diabetes Association recommends that a primary goal of therapy should be <7% and that physicians should reevaluate the treatment regimen in patients with HbA1c values consistently >8%.    HB A1C (BAYER DCA - WAIVED)  Date Value Ref Range Status  04/15/2023 6.9 (H) 4.8 - 5.6 % Final    Comment:             Prediabetes: 5.7 - 6.4  Diabetes: >6.4          Glycemic control for adults with diabetes: <7.0          Passed - CBC within normal limits and completed in the last 12 months    WBC  Date Value Ref Range Status  04/15/2023 8.7 3.4 - 10.8 x10E3/uL Final  11/12/2021 7.8 4.0 - 10.5 K/uL Final   RBC  Date Value Ref Range Status  04/15/2023 4.46 3.77 - 5.28 x10E6/uL Final  11/12/2021 3.89 3.87 - 5.11 MIL/uL Final   Hemoglobin  Date Value Ref Range Status  04/15/2023 12.6 11.1 - 15.9 g/dL Final   Hematocrit  Date Value Ref Range Status  04/15/2023 38.6 34.0 - 46.6 % Final   MCHC  Date Value Ref Range Status  04/15/2023 32.6 31.5 - 35.7 g/dL Final  04/54/0981 19.1 30.0 - 36.0 g/dL Final   [facility]  Date Value Ref Range Status  04/15/2023 28.3 26.6 - 33.0 pg Final  11/12/2021 28.8 26.0 - 34.0 pg Final   MCV  Date Value Ref Range Status  04/15/2023 87 79 - 97 fL Final  06/04/2011 88 80 - 100 fL Final   No results found for: "PLTCOUNTKUC", "LABPLAT", "POCPLA" RDW  Date Value Ref Range Status  04/15/2023 13.2 11.7 - 15.4 % Final  06/04/2011 13.2 11.5 - 14.5 % Final

## 2023-07-13 ENCOUNTER — Ambulatory Visit: Payer: Medicare Other | Admitting: Behavioral Health

## 2023-07-13 ENCOUNTER — Encounter: Payer: Self-pay | Admitting: Behavioral Health

## 2023-07-13 DIAGNOSIS — F99 Mental disorder, not otherwise specified: Secondary | ICD-10-CM | POA: Diagnosis not present

## 2023-07-13 DIAGNOSIS — F411 Generalized anxiety disorder: Secondary | ICD-10-CM

## 2023-07-13 DIAGNOSIS — F3341 Major depressive disorder, recurrent, in partial remission: Secondary | ICD-10-CM

## 2023-07-13 DIAGNOSIS — F5105 Insomnia due to other mental disorder: Secondary | ICD-10-CM

## 2023-07-13 MED ORDER — AMITRIPTYLINE HCL 50 MG PO TABS: 50.0000 mg | ORAL_TABLET | Freq: Every day | ORAL | 1 refills | Status: DC

## 2023-07-13 MED ORDER — BELSOMRA 20 MG PO TABS: 20.0000 mg | ORAL_TABLET | Freq: Every evening | ORAL | 1 refills | Status: DC

## 2023-07-13 MED ORDER — ESCITALOPRAM OXALATE 10 MG PO TABS: 10.0000 mg | ORAL_TABLET | Freq: Every day | ORAL | 1 refills | Status: DC

## 2023-07-13 NOTE — Progress Notes (Addendum)
 [facility]  Patient ID: Sarah [name],  MRN: [id number]  PCP: Sarah Melanie DASEN, NP  Date of Evaluation: 07/13/2023 Time spent:30 minutes  Chief Complaint:  Chief Complaint   Depression; Anxiety; Follow-up; Medication Refill; Patient Education     HISTORY/CURRENT STATUS: HPI 73 year old female presents to this office for follow up and medication management.  No changes this visit. She says that she continues to feel stable with Lexapro  and does not need any changes to her medications at this time.  Still enjoying the retirement community. Staying active. Participating in group trip to see shows etc.  She reports her anxiety at 1/10 and depression 1/10. Sleeping well with aid of Belsomra . Denies mania, no psychosis. No SI/HI.  Individual Medical History/ Review of Systems: Changes? :No   Allergies: Doxycycline , Ciprofloxacin, and Penicillins  Current Medications:  Current Outpatient Medications:    amitriptyline  (ELAVIL ) 50 MG tablet, Take 1 tablet (50 mg total) by mouth at bedtime., Disp: 90 tablet, Rfl: 1   amLODipine  (NORVASC ) 2.5 MG tablet, Take 1 tablet (2.5 mg total) by mouth daily., Disp: 90 tablet, Rfl: 3   Baclofen  5 MG TABS, TAKE 1 TABLET BY MOUTH EVERY 8 HOURS AS NEEDED, Disp: 90 tablet, Rfl: 0   Blood Glucose Monitoring Suppl (ONETOUCH VERIO) w/Device KIT, Use to check blood sugar 3 times a day and document results, bring to appointments.  Goal is <130 fasting blood sugar and <180 two hours after meals., Disp: 1 kit, Rfl: 0   Cholecalciferol (VITAMIN D ) 50 MCG (2000 UT) CAPS, Take by mouth., Disp: , Rfl:    Cyanocobalamin (VITAMIN B-12 PO), Take by mouth daily., Disp: , Rfl:    escitalopram  (LEXAPRO ) 10 MG tablet, Take 1 tablet (10 mg total) by mouth daily., Disp: 90 tablet, Rfl: 1   FIBER ADULT GUMMIES PO, Take 1 tablet by mouth daily., Disp: , Rfl:    furosemide  (LASIX ) 40 MG tablet, TAKE 1 TABLET BY MOUTH EVERY DAY, Disp: 90 tablet, Rfl: 4   glucose blood  (ONETOUCH VERIO) test strip, USE AS INSTRUCTED, Disp: 100 strip, Rfl: 2   hydrocortisone  (CORTEF ) 10 MG tablet, daily. 30 mg in the AM and 20 in the PM, Disp: , Rfl: 3   lidocaine  (LIDODERM ) 5 %, PLACE 1 PATCH ONTO THE SKIN DAILY. REMOVE & DISCARD PATCH WITHIN 12 HOURS OR AS DIRECTED BY MD, Disp: 30 patch, Rfl: 0   losartan  (COZAAR ) 50 MG tablet, Take 1 tablet (50 mg total) by mouth daily., Disp: 90 tablet, Rfl: 3   meclizine  (ANTIVERT ) 25 MG tablet, Take 1 tablet (25 mg total) by mouth 3 (three) times daily as needed for dizziness., Disp: 30 tablet, Rfl: 2   metFORMIN  (GLUCOPHAGE ) 1000 MG tablet, Take 1 tablet (1,000 mg total) by mouth 2 (two) times daily with a meal., Disp: 180 tablet, Rfl: 4   OneTouch UltraSoft 2 Lancets MISC, USE TO CHECK BLOOD SUGAR 3 TIMES A DAY AND DOCUMENT RESULTS, BRING TO APPOINTMENTS. GOAL IS <130 FASTING BLOOD SUGAR AND <180 TWO HOURS AFTER MEALS., Disp: 100 each, Rfl: 2   pregabalin (LYRICA) 150 MG capsule, 150 mg in am, 150 MG in afternoon, and 300 mg in pm, Disp: , Rfl:    rosuvastatin  (CRESTOR ) 10 MG tablet, Take 1 tablet (10 mg total) by mouth daily., Disp: 90 tablet, Rfl: 3   Suvorexant  (BELSOMRA ) 20 MG TABS, Take 1 tablet (20 mg total) by mouth at bedtime., Disp: 90 tablet, Rfl: 1 Medication Side Effects: none  Family Medical/ Social History: Changes? No  MENTAL HEALTH EXAM:  There were no vitals taken for this visit.There is no height or weight on file to calculate BMI.  General Appearance: Casual and Well Groomed  Eye Contact:  Good  Speech:  Clear and Coherent  Volume:  Normal  Mood:  NA  Affect:  Appropriate  Thought Process:  Coherent  Orientation:  Full (Time, Place, and Person)  Thought Content: Logical   Suicidal Thoughts:  No  Homicidal Thoughts:  No  Memory:  WNL  Judgement:  Good  Insight:  Good  Psychomotor Activity:  Normal  Concentration:  Concentration: Good  Recall:  Good  Fund of Knowledge: Good  Language: Good  Assets:  Desire  for Improvement  ADL's:  Intact  Cognition: WNL  Prognosis:  Good    DIAGNOSES:    ICD-10-CM   1. Insomnia due to other mental disorder  F51.05 amitriptyline  (ELAVIL ) 50 MG tablet   F99 Suvorexant  (BELSOMRA ) 20 MG TABS    2. Generalized anxiety disorder  F41.1 escitalopram  (LEXAPRO ) 10 MG tablet    3. Recurrent major depressive disorder, in partial remission (HCC)  F33.41 escitalopram  (LEXAPRO ) 10 MG tablet      Receiving Psychotherapy: No    RECOMMENDATIONS:   Greater than 50% of 30 min  face to face time with patient was spent on counseling and coordination of care. No changes this visit.  We discussed her current stability with her current medications. She is reporting very low levels of anxiety and depression with Lexapro . No changes are indicated at this time. Reinforced documented previous and current flagged warning of  QT prolongation and risk with Lexapro . She has been on the medication as reported by Pt since 2018.    To continue Lexapro  10 mg daily To continue Belsomra  20 mg at bedtime To continue Amitriptyline  50 mg at bedtime Will report any worsening symptoms promptly To follow up in 6 months to reassess per pt request Provided emergency contact information Reviewed PDMP     Sarah DELENA Pizza, NP

## 2023-07-19 ENCOUNTER — Ambulatory Visit (INDEPENDENT_AMBULATORY_CARE_PROVIDER_SITE_OTHER): Admitting: Dermatology

## 2023-07-19 ENCOUNTER — Encounter: Payer: Self-pay | Admitting: Dermatology

## 2023-07-19 DIAGNOSIS — I872 Venous insufficiency (chronic) (peripheral): Secondary | ICD-10-CM | POA: Diagnosis not present

## 2023-07-19 DIAGNOSIS — L821 Other seborrheic keratosis: Secondary | ICD-10-CM | POA: Diagnosis not present

## 2023-07-19 DIAGNOSIS — W908XXA Exposure to other nonionizing radiation, initial encounter: Secondary | ICD-10-CM

## 2023-07-19 DIAGNOSIS — L578 Other skin changes due to chronic exposure to nonionizing radiation: Secondary | ICD-10-CM

## 2023-07-19 DIAGNOSIS — Z85820 Personal history of malignant melanoma of skin: Secondary | ICD-10-CM

## 2023-07-19 DIAGNOSIS — L820 Inflamed seborrheic keratosis: Secondary | ICD-10-CM | POA: Diagnosis not present

## 2023-07-19 DIAGNOSIS — L817 Pigmented purpuric dermatosis: Secondary | ICD-10-CM

## 2023-07-19 NOTE — Patient Instructions (Addendum)

## 2023-07-19 NOTE — Progress Notes (Signed)
 New Patient Visit   Subjective  Sarah [name] is a 73 y.o. female who presents for the following: patient would like  2 spots at left leg and spot at right shoulder she would like checked.   Hx of malignant melanoma removed at [facility] in 2017 at right plantar foot. Reports treatment with chemo/rad and had some lymph nodes in groin removed.   The patient has spots, moles and lesions to be evaluated, some may be new or changing and the patient may have concern these could be cancer.  The following portions of the chart were reviewed this encounter and updated as appropriate: medications, allergies, medical history  Review of Systems:  No other skin or systemic complaints except as noted in HPI or Assessment and Plan.  Objective  Well appearing patient in no apparent distress; mood and affect are within normal limits.  A focused examination was performed of the following areas: B/l legs, right shoulder  Relevant exam findings are noted in the Assessment and Plan.  right shoulder x 2, left leg x 2 (4) Erythematous stuck-on, waxy papule or plaque  Assessment & Plan   ACTINIC DAMAGE - chronic, secondary to cumulative UV radiation exposure/sun exposure over time - diffuse scaly erythematous macules with underlying dyspigmentation - Recommend daily broad spectrum sunscreen SPF 30+ to sun-exposed areas, reapply every 2 hours as needed.  - Recommend staying in the shade or wearing long sleeves, sun glasses (UVA+UVB protection) and wide brim hats (4-inch brim around the entire circumference of the hat). - Call for new or changing lesions.  SEBORRHEIC KERATOSIS - Stuck-on, waxy, tan-brown papules and/or plaques  - Benign-appearing - Discussed benign etiology and prognosis. 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 With edema of ankles  Chronic  and persistent condition with duration or expected duration over one year. Condition is symptomatic/ bothersome to patient. Not currently at goal. Stasis in the legs causes chronic leg swelling, which may result in itchy or painful rashes, skin discoloration, skin texture changes, and sometimes ulceration.  Recommend daily graduated compression hose/stockings- easiest to put on first thing in morning, remove at bedtime.  Elevate legs as much as possible. Avoid salt/sodium rich foods. Treatment Plan: No recommended treatment.  Recommend daily graduated compression hose/stockings- easiest to put on first thing in morning, remove at bedtime.  Elevate legs as much as possible. Avoid salt/sodium rich foods.  HISTORY OF MELANOMA Hx of malignant melanoma removed at [facility] in 2017 at right plantar foot. Reports treatment with chemo/rad and had some lymph nodes in groin removed.  - No evidence of recurrence today - No lymphadenopathy - Recommend regular full body skin exams - Recommend daily broad spectrum sunscreen SPF 30+ to sun-exposed areas, reapply every 2 hours as needed.  - Call if any new or changing lesions are noted between office visits - Plan TBSE skin cancer screening in Sept 2025 INFLAMED SEBORRHEIC KERATOSIS (4) right shoulder x 2, left leg x 2 (4) Symptomatic, irritating, patient would like treated. Destruction of lesion - right shoulder x 2, left leg x 2 (4) Complexity: simple   Destruction method: cryotherapy   Informed consent: discussed and consent obtained   Timeout:  patient name, date of birth, surgical site, and procedure verified Lesion destroyed using liquid nitrogen: Yes   Region frozen until ice ball extended beyond lesion: Yes   Outcome: patient tolerated  procedure well with no complications   Post-procedure details: wound care instructions given    Return for September 2025 tbse hx of melanoma.  IEleanor Blush, CMA, am acting as scribe for Alm Rhyme,  MD.   Documentation: I have reviewed the above documentation for accuracy and completeness, and I agree with the above.  Alm Rhyme, MD

## 2023-07-20 ENCOUNTER — Telehealth: Payer: Self-pay | Admitting: Nurse Practitioner

## 2023-07-20 NOTE — Telephone Encounter (Signed)
-----   Message from [name] T CANNADY sent at 07/20/2023  8:11 AM EDT ----- Due for 3 month follow-up.  Please see if wishes to schedule.  We can schedule for August if needed.

## 2023-07-20 NOTE — Telephone Encounter (Signed)
 Tried to call patient to get her scheduled in August for a 3 month follow up and the number on file is invalid.

## 2023-07-21 ENCOUNTER — Other Ambulatory Visit: Payer: Self-pay | Admitting: Neurological Surgery

## 2023-07-21 DIAGNOSIS — M47816 Spondylosis without myelopathy or radiculopathy, lumbar region: Secondary | ICD-10-CM

## 2023-07-28 ENCOUNTER — Ambulatory Visit
Admission: RE | Admit: 2023-07-28 | Discharge: 2023-07-28 | Disposition: A | Discharge status: Home / Self Care | Source: Ambulatory Visit | Attending: Neurological Surgery | Admitting: Neurological Surgery

## 2023-07-28 DIAGNOSIS — M47816 Spondylosis without myelopathy or radiculopathy, lumbar region: Secondary | ICD-10-CM | POA: Diagnosis present

## 2023-08-05 ENCOUNTER — Other Ambulatory Visit: Payer: Self-pay | Admitting: Nurse Practitioner

## 2023-08-06 NOTE — Telephone Encounter (Signed)
 Requested Prescriptions  Pending Prescriptions Disp Refills   ONETOUCH VERIO test strip [Pharmacy Med Name: ONE TOUCH VERIO TEST STRIP] 100 strip 2    Sig: USE AS INSTRUCTED     Endocrinology: Diabetes - Testing Supplies Passed - 08/06/2023 11:34 AM      Passed - Valid encounter within last 12 months    Recent Outpatient Visits           3 months ago Type 2 diabetes mellitus with obesity (HCC)   [place] [facility] [place], [place] T, NP   4 months ago Chronic bilateral low back pain without sciatica   [place] [facility] [place] Melanie DASEN, NP       Future Appointments             In 2 months [name], MD [facility]

## 2023-08-25 ENCOUNTER — Other Ambulatory Visit: Payer: Self-pay | Admitting: Nurse Practitioner

## 2023-08-27 NOTE — Telephone Encounter (Signed)
 Requested Prescriptions  Refused Prescriptions Disp Refills   metFORMIN  (GLUCOPHAGE ) 500 MG tablet [Pharmacy Med Name: METFORMIN  HCL 500 MG TABLET] 360 tablet 2    Sig: START OUT TAKING ONE TABLET (500 MG) BY MOUTH TWICE A DAY, IF IN ONE WEEK TOLERATING INCREASE TO 1000 MG (2 TABLETS) IN MORNING AND 500 MG (1 TABLET) IN EVENING BY MOUTH, THEN IF TOLERATING THIS AFTER ONE WEEK INCREASE TO 1000 MG (2 TABLETS) TWICE A DAY BY MOUTH.     There is no refill protocol information for this order

## 2023-08-28 ENCOUNTER — Other Ambulatory Visit: Payer: Self-pay | Admitting: Nurse Practitioner

## 2023-08-30 ENCOUNTER — Encounter: Payer: Self-pay | Admitting: Emergency Medicine

## 2023-08-30 ENCOUNTER — Emergency Department

## 2023-08-30 ENCOUNTER — Other Ambulatory Visit: Payer: Self-pay

## 2023-08-30 ENCOUNTER — Encounter: Payer: Self-pay | Admitting: Nurse Practitioner

## 2023-08-30 ENCOUNTER — Inpatient Hospital Stay
Admission: EM | Admit: 2023-08-30 | Discharge: 2023-09-03 | DRG: 916 | Disposition: A | Discharge status: Home / Self Care | Attending: Obstetrics and Gynecology | Admitting: Obstetrics and Gynecology

## 2023-08-30 DIAGNOSIS — T886XXA Anaphylactic reaction due to adverse effect of correct drug or medicament properly administered, initial encounter: Principal | ICD-10-CM | POA: Diagnosis present

## 2023-08-30 DIAGNOSIS — Z9221 Personal history of antineoplastic chemotherapy: Secondary | ICD-10-CM

## 2023-08-30 DIAGNOSIS — Z818 Family history of other mental and behavioral disorders: Secondary | ICD-10-CM | POA: Diagnosis not present

## 2023-08-30 DIAGNOSIS — Z823 Family history of stroke: Secondary | ICD-10-CM

## 2023-08-30 DIAGNOSIS — R21 Rash and other nonspecific skin eruption: Secondary | ICD-10-CM

## 2023-08-30 DIAGNOSIS — I129 Hypertensive chronic kidney disease with stage 1 through stage 4 chronic kidney disease, or unspecified chronic kidney disease: Secondary | ICD-10-CM | POA: Diagnosis present

## 2023-08-30 DIAGNOSIS — T380X5A Adverse effect of glucocorticoids and synthetic analogues, initial encounter: Secondary | ICD-10-CM | POA: Diagnosis present

## 2023-08-30 DIAGNOSIS — N179 Acute kidney failure, unspecified: Secondary | ICD-10-CM | POA: Diagnosis present

## 2023-08-30 DIAGNOSIS — Z880 Allergy status to penicillin: Secondary | ICD-10-CM | POA: Diagnosis not present

## 2023-08-30 DIAGNOSIS — Z886 Allergy status to analgesic agent status: Secondary | ICD-10-CM | POA: Diagnosis not present

## 2023-08-30 DIAGNOSIS — Z8249 Family history of ischemic heart disease and other diseases of the circulatory system: Secondary | ICD-10-CM | POA: Diagnosis not present

## 2023-08-30 DIAGNOSIS — N1832 Chronic kidney disease, stage 3b: Secondary | ICD-10-CM | POA: Diagnosis present

## 2023-08-30 DIAGNOSIS — Z881 Allergy status to other antibiotic agents status: Secondary | ICD-10-CM

## 2023-08-30 DIAGNOSIS — Z833 Family history of diabetes mellitus: Secondary | ICD-10-CM

## 2023-08-30 DIAGNOSIS — E785 Hyperlipidemia, unspecified: Secondary | ICD-10-CM | POA: Diagnosis present

## 2023-08-30 DIAGNOSIS — Z90710 Acquired absence of both cervix and uterus: Secondary | ICD-10-CM

## 2023-08-30 DIAGNOSIS — E1140 Type 2 diabetes mellitus with diabetic neuropathy, unspecified: Secondary | ICD-10-CM | POA: Diagnosis present

## 2023-08-30 DIAGNOSIS — E872 Acidosis, unspecified: Secondary | ICD-10-CM | POA: Diagnosis present

## 2023-08-30 DIAGNOSIS — E669 Obesity, unspecified: Secondary | ICD-10-CM | POA: Diagnosis present

## 2023-08-30 DIAGNOSIS — E2749 Other adrenocortical insufficiency: Secondary | ICD-10-CM | POA: Diagnosis present

## 2023-08-30 DIAGNOSIS — E1122 Type 2 diabetes mellitus with diabetic chronic kidney disease: Secondary | ICD-10-CM | POA: Diagnosis present

## 2023-08-30 DIAGNOSIS — T782XXA Anaphylactic shock, unspecified, initial encounter: Secondary | ICD-10-CM | POA: Diagnosis present

## 2023-08-30 DIAGNOSIS — T368X5A Adverse effect of other systemic antibiotics, initial encounter: Secondary | ICD-10-CM | POA: Diagnosis present

## 2023-08-30 DIAGNOSIS — E1165 Type 2 diabetes mellitus with hyperglycemia: Secondary | ICD-10-CM | POA: Diagnosis present

## 2023-08-30 DIAGNOSIS — Z85820 Personal history of malignant melanoma of skin: Secondary | ICD-10-CM

## 2023-08-30 DIAGNOSIS — Z801 Family history of malignant neoplasm of trachea, bronchus and lung: Secondary | ICD-10-CM | POA: Diagnosis not present

## 2023-08-30 DIAGNOSIS — Z7984 Long term (current) use of oral hypoglycemic drugs: Secondary | ICD-10-CM

## 2023-08-30 DIAGNOSIS — A419 Sepsis, unspecified organism: Principal | ICD-10-CM

## 2023-08-30 LAB — RESPIRATORY PANEL BY PCR

## 2023-08-30 LAB — MRSA NEXT GEN BY PCR, NASAL: MRSA by PCR Next Gen: NOT DETECTED

## 2023-08-30 LAB — CBC WITH DIFFERENTIAL/PLATELET
Abs Immature Granulocytes: 0.14 K/uL — ABNORMAL HIGH (ref 0.00–0.07)
Basophils Absolute: 0 K/uL (ref 0.0–0.1)
Basophils Relative: 0 %
Eosinophils Absolute: 1 K/uL — ABNORMAL HIGH (ref 0.0–0.5)
Eosinophils Relative: 6 %
HCT: 37.9 % (ref 36.0–46.0)
Hemoglobin: 12.7 g/dL (ref 12.0–15.0)
Immature Granulocytes: 1 %
Lymphocytes Relative: 16 %
Lymphs Abs: 2.6 K/uL (ref 0.7–4.0)
MCH: 29.7 pg (ref 26.0–34.0)
MCHC: 33.5 g/dL (ref 30.0–36.0)
MCV: 88.8 fL (ref 80.0–100.0)
Monocytes Absolute: 0.8 K/uL (ref 0.1–1.0)
Monocytes Relative: 5 %
Neutro Abs: 11.9 K/uL — ABNORMAL HIGH (ref 1.7–7.7)
Neutrophils Relative %: 72 %
Platelets: 243 K/uL (ref 150–400)
RBC: 4.27 MIL/uL (ref 3.87–5.11)
RDW: 13.9 % (ref 11.5–15.5)
WBC: 16.5 K/uL — ABNORMAL HIGH (ref 4.0–10.5)
nRBC: 0.4 % — ABNORMAL HIGH (ref 0.0–0.2)

## 2023-08-30 LAB — URINALYSIS, ROUTINE W REFLEX MICROSCOPIC
Bilirubin Urine: NEGATIVE
Glucose, UA: NEGATIVE mg/dL
Hgb urine dipstick: NEGATIVE
Ketones, ur: 5 mg/dL — AB
Leukocytes,Ua: NEGATIVE
Nitrite: NEGATIVE
Protein, ur: NEGATIVE mg/dL
Specific Gravity, Urine: 1.012 (ref 1.005–1.030)
pH: 5 (ref 5.0–8.0)

## 2023-08-30 LAB — COMPREHENSIVE METABOLIC PANEL WITH GFR
ALT: 20 U/L (ref 0–44)
AST: 22 U/L (ref 15–41)
Albumin: 3.1 g/dL — ABNORMAL LOW (ref 3.5–5.0)
Alkaline Phosphatase: 59 U/L (ref 38–126)
Anion gap: 14 (ref 5–15)
BUN: 29 mg/dL — ABNORMAL HIGH (ref 8–23)
CO2: 22 mmol/L (ref 22–32)
Calcium: 8.4 mg/dL — ABNORMAL LOW (ref 8.9–10.3)
Chloride: 101 mmol/L (ref 98–111)
Creatinine, Ser: 2.27 mg/dL — ABNORMAL HIGH (ref 0.44–1.00)
GFR, Estimated: 22 mL/min — ABNORMAL LOW (ref >60)
Glucose, Bld: 162 mg/dL — ABNORMAL HIGH (ref 70–99)
Potassium: 4.4 mmol/L (ref 3.5–5.1)
Sodium: 137 mmol/L (ref 135–145)
Total Bilirubin: 1.2 mg/dL (ref 0.0–1.2)
Total Protein: 6.1 g/dL — ABNORMAL LOW (ref 6.5–8.1)

## 2023-08-30 LAB — LACTIC ACID, PLASMA
Lactic Acid, Venous: 3.1 mmol/L (ref 0.5–1.9)
Lactic Acid, Venous: 2.7 mmol/L (ref 0.5–1.9)

## 2023-08-30 LAB — LIPASE, BLOOD: Lipase: 22 U/L (ref 11–51)

## 2023-08-30 LAB — GLUCOSE, CAPILLARY: Glucose-Capillary: 293 mg/dL — ABNORMAL HIGH (ref 70–99)

## 2023-08-30 LAB — TROPONIN I (HIGH SENSITIVITY)
Troponin I (High Sensitivity): 8 ng/L (ref <18)
Troponin I (High Sensitivity): 8 ng/L (ref <18)

## 2023-08-30 MED ORDER — METRONIDAZOLE 500 MG/100ML IV SOLN
500.0000 mg | Freq: Once | INTRAVENOUS | Status: AC
  Administered 2023-08-30 (×2): 500 mg via INTRAVENOUS
  Filled 2023-08-30: qty 100

## 2023-08-30 MED ORDER — LACTATED RINGERS IV BOLUS
1000.0000 mL | Freq: Once | INTRAVENOUS | Status: AC
  Administered 2023-08-30 (×2): 1000 mL via INTRAVENOUS

## 2023-08-30 MED ORDER — CHLORHEXIDINE GLUCONATE CLOTH 2 % EX PADS
6.0000 | MEDICATED_PAD | Freq: Every day | CUTANEOUS | Status: DC
  Administered 2023-08-30 – 2023-09-01 (×6): 6 via TOPICAL
  Filled 2023-08-30: qty 6

## 2023-08-30 MED ORDER — HYDROCORTISONE SOD SUC (PF) 100 MG IJ SOLR
100.0000 mg | Freq: Once | INTRAMUSCULAR | Status: AC
  Administered 2023-08-30 (×2): 100 mg via INTRAVENOUS
  Filled 2023-08-30: qty 2

## 2023-08-30 MED ORDER — DEXAMETHASONE SODIUM PHOSPHATE 4 MG/ML IJ SOLN
4.0000 mg | Freq: Every day | INTRAMUSCULAR | Status: DC
  Administered 2023-08-30 – 2023-08-31 (×4): 4 mg via INTRAVENOUS
  Filled 2023-08-30 (×2): qty 1

## 2023-08-30 MED ORDER — SODIUM CHLORIDE 0.9 % IV SOLN
2.0000 g | Freq: Once | INTRAVENOUS | Status: AC
  Administered 2023-08-30 (×2): 2 g via INTRAVENOUS
  Filled 2023-08-30: qty 12.5

## 2023-08-30 MED ORDER — SODIUM CHLORIDE 0.9 % IV SOLN: 250.0000 mL | INTRAVENOUS | Status: AC

## 2023-08-30 MED ORDER — DOCUSATE SODIUM 100 MG PO CAPS: 100.0000 mg | ORAL_CAPSULE | Freq: Two times a day (BID) | ORAL | Status: DC | PRN

## 2023-08-30 MED ORDER — NOREPINEPHRINE 4 MG/250ML-% IV SOLN
0.0000 ug/min | INTRAVENOUS | Status: DC
  Administered 2023-08-30 – 2023-08-31 (×4): 2 ug/min via INTRAVENOUS
  Filled 2023-08-30 (×2): qty 250

## 2023-08-30 MED ORDER — POLYETHYLENE GLYCOL 3350 17 G PO PACK
17.0000 g | PACK | Freq: Every day | ORAL | Status: DC | PRN
Start: 2023-08-30 — End: 2023-09-03

## 2023-08-30 MED ORDER — DEXAMETHASONE SODIUM PHOSPHATE 10 MG/ML IJ SOLN: 10.0000 mg | INTRAMUSCULAR | Status: DC

## 2023-08-30 MED ORDER — ASPIRIN 300 MG RE SUPP: 300.0000 mg | RECTAL | Status: DC

## 2023-08-30 MED ORDER — FLUDROCORTISONE ACETATE 0.1 MG PO TABS
0.0500 mg | ORAL_TABLET | Freq: Every day | ORAL | Status: DC
  Administered 2023-08-30 – 2023-08-31 (×4): 0.05 mg via ORAL
  Filled 2023-08-30: qty 1
  Filled 2023-08-30: qty 0.5
  Filled 2023-08-30: qty 1

## 2023-08-30 MED ORDER — VANCOMYCIN HCL IN DEXTROSE 1-5 GM/200ML-% IV SOLN
1000.0000 mg | Freq: Once | INTRAVENOUS | Status: AC
  Administered 2023-08-30 (×2): 1000 mg via INTRAVENOUS
  Filled 2023-08-30: qty 200

## 2023-08-30 MED ORDER — HEPARIN SODIUM (PORCINE) 5000 UNIT/ML IJ SOLN
5000.0000 [IU] | Freq: Three times a day (TID) | INTRAMUSCULAR | Status: AC
  Administered 2023-08-30 – 2023-09-01 (×14): 5000 [IU] via SUBCUTANEOUS
  Filled 2023-08-30 (×7): qty 1

## 2023-08-30 MED ORDER — LACTATED RINGERS IV SOLN: INTRAVENOUS | Status: AC

## 2023-08-30 MED ORDER — FAMOTIDINE IN NACL 20-0.9 MG/50ML-% IV SOLN
20.0000 mg | Freq: Two times a day (BID) | INTRAVENOUS | Status: DC
  Administered 2023-08-30 – 2023-08-31 (×6): 20 mg via INTRAVENOUS
  Filled 2023-08-30 (×3): qty 50

## 2023-08-30 MED ORDER — ACETAMINOPHEN 325 MG PO TABS
650.0000 mg | ORAL_TABLET | Freq: Four times a day (QID) | ORAL | Status: DC | PRN
  Administered 2023-08-31 – 2023-09-03 (×5): 650 mg via ORAL
  Filled 2023-08-30 (×3): qty 2

## 2023-08-30 MED ORDER — HYDROCORTISONE 1 % EX LOTN
TOPICAL_LOTION | Freq: Two times a day (BID) | CUTANEOUS | Status: DC
  Filled 2023-08-30 (×4): qty 118

## 2023-08-30 MED ORDER — ASPIRIN 81 MG PO CHEW: 324.0000 mg | CHEWABLE_TABLET | ORAL | Status: DC

## 2023-08-30 MED ORDER — DIPHENHYDRAMINE HCL 50 MG/ML IJ SOLN
12.5000 mg | Freq: Two times a day (BID) | INTRAMUSCULAR | Status: DC
  Administered 2023-08-30 – 2023-08-31 (×4): 12.5 mg via INTRAVENOUS
  Filled 2023-08-30 (×2): qty 1

## 2023-08-30 NOTE — ED Notes (Signed)
 [place], MD notified of pt's BP of 61/41. Pt placed in trendelenburg. 2nd liter of fluids hung

## 2023-08-30 NOTE — Plan of Care (Signed)

## 2023-08-30 NOTE — ED Provider Notes (Signed)
 [facility] Provider Note    Event Date/Time   First MD Initiated Contact with Patient 08/30/23 1142     (approximate)   History   Chief Complaint Hypotension   HPI  Sarah [name] is a 73 y.o. female with past medical history of hypertension, hyperlipidemia, diabetes, and adrenal insufficiency who presents to the ED complaining of hypotension.  Per EMS, patient has been dealing with waxing and waning rash over the past week along with generalized weakness.  She was found to be hypotensive and EMS was concern for anaphylactic reaction, she was subsequently given 0.3 mg of IM epinephrine , 50 mg of Benadryl , and 20 mg IV Pepcid .  She was reportedly taking clindamycin for a tooth infection recently but was told to stop that antibiotic by her PCP after developing a rash.  Patient additionally complains of some abdominal pain and nausea, denies any fevers, diarrhea, dysuria, cough, chest pain, or shortness of breath.     Physical Exam   Triage Vital Signs: ED Triage Vitals  Encounter Vitals Group     BP      Girls Systolic BP Percentile      Girls Diastolic BP Percentile      Boys Systolic BP Percentile      Boys Diastolic BP Percentile      Pulse      Resp      Temp      Temp src      SpO2      Weight      Height      Head Circumference      Peak Flow      Pain Score      Pain Loc      Pain Education      Exclude from Growth Chart     Most recent vital signs: Vitals:   08/30/23 1415 08/30/23 1430  BP: 104/74 91/62  Pulse: 85 93  Resp: 18 (!) 25  Temp:    SpO2: 98% 92%    Constitutional: Somnolent but easily arousable to voice, oriented to person, place, time, and situation. Eyes: Conjunctivae are normal. Head: Atraumatic. Nose: No congestion/rhinnorhea. Mouth/Throat: Mucous membranes are extremely dry.  No oral lesions noted. Cardiovascular: Normal rate, regular rhythm. Grossly normal heart sounds.  2+ radial pulses  bilaterally. Respiratory: Normal respiratory effort.  No retractions. Lungs CTAB. Gastrointestinal: Soft and diffusely tender to palpation with no rebound or guarding. No distention. Musculoskeletal: No lower extremity tenderness nor edema.  Diffuse erythematous and maculopapular rash which is most prominent on the trunk, where it coalesces to blanchable erythema.  No bulla or skin sloughing noted. Neurologic:  Normal speech and language. No gross focal neurologic deficits are appreciated.    ED Results / Procedures / Treatments   Labs (all labs ordered are listed, but only abnormal results are displayed) Labs Reviewed  CBC WITH DIFFERENTIAL/PLATELET - Abnormal; Notable for the following components:      Result Value   WBC 16.5 (*)    nRBC 0.4 (*)    Neutro Abs 11.9 (*)    Eosinophils Absolute 1.0 (*)    Abs Immature Granulocytes 0.14 (*)    All other components within normal limits  COMPREHENSIVE METABOLIC PANEL WITH GFR - Abnormal; Notable for the following components:   Glucose, Bld 162 (*)    BUN 29 (*)    Creatinine, Ser 2.27 (*)    Calcium  8.4 (*)    Total Protein 6.1 (*)  Albumin 3.1 (*)    GFR, Estimated 22 (*)    All other components within normal limits  LACTIC ACID, PLASMA - Abnormal; Notable for the following components:   Lactic Acid, Venous 3.1 (*)    All other components within normal limits  CULTURE, BLOOD (ROUTINE X 2)  CULTURE, BLOOD (ROUTINE X 2)  RESPIRATORY PANEL BY PCR  LACTIC ACID, PLASMA  URINALYSIS, ROUTINE W REFLEX MICROSCOPIC  LIPASE, BLOOD  TROPONIN I (HIGH SENSITIVITY)  TROPONIN I (HIGH SENSITIVITY)     EKG  ED ECG REPORT I, Carlin Palin, the attending physician, personally viewed and interpreted this ECG.   Date: 08/30/2023  EKG Time: 11:39  Rate: 85  Rhythm: normal sinus rhythm  Axis: LAD  Intervals:nonspecific intraventricular conduction delay  ST&T Change: None  RADIOLOGY Chest x-ray reviewed and interpreted by me with no  infiltrate, edema, or effusion.  PROCEDURES:  Critical Care performed: Yes, see critical care procedure note(s)  .Critical Care  Performed by: Palin Carlin, MD Authorized by: Palin Carlin, MD   Critical care provider statement:    Critical care time (minutes):  30   Critical care time was exclusive of:  Separately billable procedures and treating other patients and teaching time   Critical care was necessary to treat or prevent imminent or life-threatening deterioration of the following conditions:  Sepsis   Critical care was time spent personally by me on the following activities:  Development of treatment plan with patient or surrogate, discussions with consultants, evaluation of patient's response to treatment, examination of patient, ordering and review of laboratory studies, ordering and review of radiographic studies, ordering and performing treatments and interventions, pulse oximetry, re-evaluation of patient's condition and review of old charts   I assumed direction of critical care for this patient from another provider in my specialty: no     Care discussed with: admitting provider      MEDICATIONS ORDERED IN ED: Medications  0.9 %  sodium chloride  infusion (0 mLs Intravenous Hold 08/30/23 1334)  norepinephrine  (LEVOPHED ) 4mg  in (0.016 mg/mL) premix infusion (6 mcg/min Intravenous Rate/Dose Verify 08/30/23 1506)  lactated ringers  bolus 1,000 mL (0 mLs Intravenous Stopped 08/30/23 1506)  hydrocortisone  sodium succinate  (SOLU-CORTEF ) 100 MG injection 100 mg (100 mg Intravenous Given 08/30/23 1256)  ceFEPIme  (MAXIPIME ) 2 g in sodium chloride  0.9 % 100 mL IVPB (0 g Intravenous Stopped 08/30/23 1243)  metroNIDAZOLE  (FLAGYL ) IVPB 500 mg (0 mg Intravenous Stopped 08/30/23 1400)  vancomycin  (VANCOCIN ) IVPB 1000 mg/200 mL premix (0 mg Intravenous Stopped 08/30/23 1502)  lactated ringers  bolus 1,000 mL (0 mLs Intravenous Stopped 08/30/23 1333)  lactated ringers  bolus 1,000 mL (0  mLs Intravenous Stopped 08/30/23 1506)     IMPRESSION / MDM / ASSESSMENT AND PLAN / ED COURSE  I reviewed the triage vital signs and the nursing notes.                              72 y.o. female with past medical history of hypertension, hyperlipidemia, diabetes, and adrenal insufficiency who presents to the ED complaining of hypotension with diffuse rash and abdominal pain.  Patient's presentation is most consistent with acute presentation with potential threat to life or bodily function.  Differential diagnosis includes, but is not limited to, sepsis, cellulitis, SJS, toxic shock syndrome, appendicitis, diverticulitis, UTI, foreign body, anaphylaxis.  Patient ill-appearing but in no acute distress, vital signs remarkable for hypertension but otherwise reassuring.  EMS had treated  patient for anaphylaxis, but this seems less likely with symptoms over 2 weeks and rash less consistent with hives.  SJS considered but seems less likely with no desquamation or mucous membrane involvement.  Overall presentation concerning for sepsis and we will initiate IV fluid resuscitation and broad-spectrum antibiotics.  Rash most concerning for toxic shock syndrome but no clear source at this time, patient does have abdominal pain and we will check CT of her abdomen/pelvis.  Chest x-ray is unremarkable, CT head with no obvious pathology by my read, CT chest/abdomen/pelvis results are pending.  Labs show leukocytosis with elevation in neutrophil count as well as eosinophils, would consider DRESS syndrome.  Patient with minimal improvement in BP following IV fluid hydration, will start on Levophed .  Patient also with AKI but no significant electrolyte abnormality and LFTs are unremarkable.  Lactic acid elevated and we will trend, case discussed with ICU team for admission.      FINAL CLINICAL IMPRESSION(S) / ED DIAGNOSES   Final diagnoses:  Sepsis, due to unspecified organism, unspecified whether acute organ  dysfunction present (HCC)  Rash     Rx / DC Orders   ED Discharge Orders     None        Note:  This document was prepared using Dragon voice recognition software and may include unintentional dictation errors.   Willo Dunnings, MD 08/30/23 [phone]

## 2023-08-30 NOTE — ED Notes (Signed)
 CCMD called at this time.

## 2023-08-30 NOTE — Progress Notes (Signed)
 CODE SEPSIS - PHARMACY COMMUNICATION  **Broad Spectrum Antibiotics should be administered within 1 hour of Sepsis diagnosis**  Time Code Sepsis Called/Page Received: 1155  Antibiotics Ordered: Cefepime , vancomycin , metronidazole   Time of 1st antibiotic administration: 1212  Additional action taken by pharmacy: N/A  Sarah [name] 08/30/2023  12:00 PM

## 2023-08-30 NOTE — ED Notes (Signed)
 Informed RN bed assigned

## 2023-08-30 NOTE — Sepsis Progress Note (Signed)
 Elink will follow per sepsis protocol.

## 2023-08-30 NOTE — Progress Notes (Signed)
 Per patient has not urinated since yesterday. Pt has urge to urinate but not able to urinate. Bladder scanned pt yield > 850 cc. Dr. DELENA notified received verbal orders for I&O.

## 2023-08-30 NOTE — ED Triage Notes (Addendum)
 Pt from home via [facility] with reports of rash, SHOB, and hypotensive. Pt received 0.3 Epi and Pepcid . Pt was taking antibiotic for a tooth infection x 2 weeks. Pt has finished that course of antibiotics.

## 2023-08-30 NOTE — ED Notes (Signed)
 Rash spreading down arms and into scalp and face. MD aware.

## 2023-08-30 NOTE — H&P (Signed)
 CRITICAL CARE    Name: Sarah [name] MRN: [id number] DOB: 1950-04-20     LOS: 0   SUBJECTIVE FINDINGS & SIGNIFICANT EVENTS    History of Presenting Illness:   This is a 73 yo F with hx of dyslipidemia, HTN, DM2, neuropathy, depression , prolonged QT syndrome,  history of malignant melanoma, obesity, vitamin D  deficiency who came in after noting a worsening rash all over her body.  She noted erythematous papular prutic rash forming appx 24h after initiation of antimicrobial therapy.  She did have dental procedure performed recently.  She was prescribed clindamycin for this purpose and had developed rash after starting this therapy.  She recalls that when she had a previous infection of foot where she had a melanomatous lesion at the time she was also prescribed clindamycin and subsequently developed a rash quickly spread and required 1 week hospitalization at [facility].  She endorses pruritus on rash involved areas.  Her lactate was elevated on arrival and it was noted she was in shock physiology.  Despite receiving almost 3L of IVF she continued to be hypotensive and required IV levophed  for vasopressor support to keep BP MAP > 65.  She is able to speak, is AAOx4 and is accompanied by her daughter.  PCCM admission requriested due to anaphylactic shock requiring pressor support despite IVF.   Lines/tubes :   Microbiology/Sepsis markers: Results for orders placed or performed in visit on 03/17/22  Microscopic Examination     Status: None   Collection Time: 03/20/22 12:02 PM   BLD  Result Value Ref Range Status   WBC, UA 0-5 0 - 5 /hpf Final   RBC, Urine None seen 0 - 2 /hpf Final   Epithelial Cells (non renal) 0-10 0 - 10 /hpf Final   Bacteria, UA Few None seen/Few Final    Anti-infectives:  Anti-infectives  (From admission, onward)    Start     Dose/Rate Route Frequency Ordered Stop   08/30/23 1200  ceFEPIme  (MAXIPIME ) 2 g in sodium chloride  0.9 % 100 mL IVPB        2 g 200 mL/hr over 30 Minutes Intravenous  Once 08/30/23 1155 08/30/23 1243   08/30/23 1200  metroNIDAZOLE  (FLAGYL ) IVPB 500 mg        500 mg 100 mL/hr over 60 Minutes Intravenous  Once 08/30/23 1155 08/30/23 1400   08/30/23 1200  vancomycin  (VANCOCIN ) IVPB 1000 mg/200 mL premix        1,000 mg 200 mL/hr over 60 Minutes Intravenous  Once 08/30/23 1155 08/30/23 1502        PAST MEDICAL HISTORY   Past Medical History:  Diagnosis Date   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HCC)    Adrenal insufficiency (HCC)    Anxiety    B12 deficiency    Cancer (HCC) 2018-2019   melanoma   Cervical radiculitis    Constipation    Dependent edema    Depression    Diabetes mellitus    PRE,NO MEDICATIONS AS OF 03/05/21   Dysmenorrhea    H/O: suicide attempt    History of antineoplastic chemotherapy    History of chemotherapy    Hyperlipidemia    Hypertension    Insomnia    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HCC)    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)    PMS (premenstrual syndrome)      SURGICAL HISTORY   Past Surgical History:  Procedure Laterality Date   BREAST BIOPSY Right    benign  CHOLECYSTECTOMY     COLONOSCOPY  11/08/2018   Danis-polyps   COLONOSCOPY  12/05/2019   Danis   DILATION AND CURETTAGE OF UTERUS     FOOT SURGERY Right 2018   removal of melanoma   LUMBAR FUSION     lymph node removal Right 2018   groin    POLYPECTOMY     TOTAL ABDOMINAL HYSTERECTOMY W/ BILATERAL SALPINGOOPHORECTOMY     TUBAL LIGATION       FAMILY HISTORY   Family History  Problem Relation Age of Onset   Depression Mother    Anxiety disorder Mother    Bipolar disorder Mother    Diabetes Mother    Mental illness Mother    Heart disease Mother    Stroke Mother    Lung cancer Father    Diabetes Maternal Grandmother    Heart attack  Maternal Grandmother    CVA Paternal Grandmother    Colon cancer Neg Hx    Liver cancer Neg Hx    Pancreatic cancer Neg Hx    Esophageal cancer Neg Hx    Stomach cancer Neg Hx    Rectal cancer Neg Hx    Colon polyps Neg Hx    Breast cancer Neg Hx      SOCIAL HISTORY   Social History   Tobacco Use   Smoking status: Never    Passive exposure: Never   Smokeless tobacco: Never  Vaping Use   Vaping status: Never Used  Substance Use Topics   Alcohol use: Not Currently   Drug use: Never     MEDICATIONS   Current Medication:  Current Facility-Administered Medications:    0.9 %  sodium chloride  infusion, 250 mL, Intravenous, Continuous, Willo Dunnings, MD, Held at 08/30/23 1334   aspirin  chewable tablet 324 mg, 324 mg, Oral, NOW **OR** aspirin  suppository 300 mg, 300 mg, Rectal, NOW, Yashas Camilli, MD   Chlorhexidine  Gluconate Cloth 2 % PADS 6 each, 6 each, Topical, Daily, Javia Dillow, MD   docusate sodium  (COLACE) capsule 100 mg, 100 mg, Oral, BID PRN, Terre Zabriskie, MD   heparin  injection 5,000 Units, 5,000 Units, Subcutaneous, Q8H, Arhianna Ebey, MD   hydrocortisone  1 % lotion, , Topical, BID, Kyrese Gartman, MD   norepinephrine  (LEVOPHED ) 4mg  in (0.016 mg/mL) premix infusion, 0-10 mcg/min, Intravenous, Titrated, Willo Dunnings, MD, Last Rate: 26.3 mL/hr at 08/30/23 1519, 7 mcg/min at 08/30/23 1519   polyethylene glycol (MIRALAX  / GLYCOLAX ) packet 17 g, 17 g, Oral, Daily PRN, Khylin Gutridge, MD  Current Outpatient Medications:    amitriptyline  (ELAVIL ) 50 MG tablet, Take 1 tablet (50 mg total) by mouth at bedtime., Disp: 90 tablet, Rfl: 1   amLODipine  (NORVASC ) 2.5 MG tablet, Take 1 tablet (2.5 mg total) by mouth daily., Disp: 90 tablet, Rfl: 3   Baclofen  5 MG TABS, TAKE 1 TABLET BY MOUTH EVERY 8 HOURS AS NEEDED, Disp: 90 tablet, Rfl: 0   Cholecalciferol (VITAMIN D ) 50 MCG (2000 UT) CAPS, Take by mouth., Disp: , Rfl:    Cyanocobalamin (VITAMIN B-12 PO),  Take by mouth daily., Disp: , Rfl:    escitalopram  (LEXAPRO ) 10 MG tablet, Take 1 tablet (10 mg total) by mouth daily., Disp: 90 tablet, Rfl: 1   FIBER ADULT GUMMIES PO, Take 1 tablet by mouth daily., Disp: , Rfl:    furosemide  (LASIX ) 40 MG tablet, TAKE 1 TABLET BY MOUTH EVERY DAY, Disp: 90 tablet, Rfl: 4   HYDROcodone -acetaminophen  (NORCO/VICODIN) 5-325 MG tablet, Take 1 tablet by mouth every  6 (six) hours as needed for moderate pain (pain score 4-6)., Disp: , Rfl:    hydrocortisone  (CORTEF ) 5 MG tablet, TAKE THREE TABLETS IN THE MORNING AND ONE TABLET IN THE AFTERNOON, Disp: , Rfl:    losartan  (COZAAR ) 50 MG tablet, Take 1 tablet (50 mg total) by mouth daily., Disp: 90 tablet, Rfl: 3   meclizine  (ANTIVERT ) 25 MG tablet, Take 1 tablet (25 mg total) by mouth 3 (three) times daily as needed for dizziness., Disp: 30 tablet, Rfl: 2   metFORMIN  (GLUCOPHAGE ) 1000 MG tablet, Take 1 tablet (1,000 mg total) by mouth 2 (two) times daily with a meal., Disp: 180 tablet, Rfl: 4   pregabalin  (LYRICA ) 150 MG capsule, 150 mg in am, 150 MG in afternoon, and 300 mg in pm, Disp: , Rfl:    rosuvastatin  (CRESTOR ) 10 MG tablet, Take 1 tablet (10 mg total) by mouth daily., Disp: 90 tablet, Rfl: 3   Suvorexant  (BELSOMRA ) 20 MG TABS, Take 1 tablet (20 mg total) by mouth at bedtime., Disp: 90 tablet, Rfl: 1   Blood Glucose Monitoring Suppl (ONETOUCH VERIO) w/Device KIT, Use to check blood sugar 3 times a day and document results, bring to appointments.  Goal is <130 fasting blood sugar and <180 two hours after meals., Disp: 1 kit, Rfl: 0   lidocaine  (LIDODERM ) 5 %, PLACE 1 PATCH ONTO THE SKIN DAILY. REMOVE & DISCARD PATCH WITHIN 12 HOURS OR AS DIRECTED BY MD, Disp: 30 patch, Rfl: 0   OneTouch UltraSoft 2 Lancets MISC, USE TO CHECK BLOOD SUGAR 3 TIMES A DAY AND DOCUMENT RESULTS, BRING TO APPOINTMENTS. GOAL IS <130 FASTING BLOOD SUGAR AND <180 TWO HOURS AFTER MEALS., Disp: 100 each, Rfl: 2   ONETOUCH VERIO test strip, USE AS  INSTRUCTED, Disp: 100 strip, Rfl: 2    ALLERGIES   Doxycycline , Ciprofloxacin, and Penicillins    REVIEW OF SYSTEMS     10 point ROS done and is negative except for pruritic rash otherwise as per subjective findings and h/p  PHYSICAL EXAMINATION   Vital Signs: Temp:  [98.2 F (36.8 C)] 98.2 F (36.8 C) (08/11 1206) Pulse Rate:  [34-114] 87 (08/11 1510) Resp:  [13-29] 22 (08/11 1510) BP: (61-133)/(36-74) 110/50 (08/11 1510) SpO2:  [92 %-100 %] 100 % (08/11 1510)  GENERAL:NAD age appropriate , obese  HEAD: Normocephalic, atraumatic.  EYES: Pupils equal, round, reactive to light.  No scleral icterus.  MOUTH: Moist mucosal membrane. NECK: Supple. No thyromegaly. No nodules. No JVD.  PULMONARY: CTAB CARDIOVASCULAR: S1 and S2. Regular rate and rhythm. No murmurs, rubs, or gallops.  GASTROINTESTINAL: Soft, nontender, non-distended. No masses. Positive bowel sounds. No hepatosplenomegaly.  MUSCULOSKELETAL: No swelling, clubbing, or edema.  NEUROLOGIC: Mild distress due to acute illness SKIN: erythematous, raised papular rash with pruritus on neck, torso, exremeties   PERTINENT DATA     Infusions:  sodium chloride  Stopped (08/30/23 1334)   norepinephrine  (LEVOPHED ) Adult infusion 7 mcg/min (08/30/23 1519)   Scheduled Medications:  aspirin   324 mg Oral NOW   Or   aspirin   300 mg Rectal NOW   Chlorhexidine  Gluconate Cloth  6 each Topical Daily   heparin   5,000 Units Subcutaneous Q8H   hydrocortisone    Topical BID   PRN Medications: docusate sodium , polyethylene glycol Hemodynamic parameters:   Intake/Output: No intake/output data recorded.  Ventilator  Settings:     LAB RESULTS:  Basic Metabolic Panel: Recent Labs  Lab 08/30/23 1147  NA 137  K 4.4  CL 101  CO2 22  GLUCOSE 162*  BUN 29*  CREATININE 2.27*  CALCIUM  8.4*   Liver Function Tests: Recent Labs  Lab 08/30/23 1147  AST 22  ALT 20  ALKPHOS 59  BILITOT 1.2  PROT 6.1*  ALBUMIN 3.1*    No results for input(s): LIPASE, AMYLASE in the last 168 hours. No results for input(s): AMMONIA in the last 168 hours. CBC: Recent Labs  Lab 08/30/23 1147  WBC 16.5*  NEUTROABS 11.9*  HGB 12.7  HCT 37.9  MCV 88.8  PLT 243   Cardiac Enzymes: No results for input(s): CKTOTAL, CKMB, CKMBINDEX, TROPONINI in the last 168 hours. BNP: Invalid input(s): POCBNP CBG: No results for input(s): GLUCAP in the last 168 hours.     IMAGING RESULTS:     ASSESSMENT AND PLAN    -Multidisciplinary rounds held today  Circulatory shock     Due to anaphylaxis from recent antibiotic therapy with clindamycin   -present on admission    - IVF   - IV steroids    - topical hydrocortisone  1% bid to affected areas    - Currently on low dose levophed  , I'm hoping we would not need central line and can wean off pressor support within few hours or so. Currently on low dose peripherally -IV LR 50cc/hr  Exanthematous drug reaction   - patient does not have mucosal disruption and does not appear to have SJS/TEN  -currently with pruritus but no painful or blistering lesions  - s/p solucortef 100mg  x 1, now transitioned to lower dose decadron  due to DM  - continue topical 1% hydrocortisone  lotion  - continue pepcid  IV bid    - cotninue diphenhydramine  12.5 iV bid   - monitor for worsening of skin lesion ICU monitoring telemetry   Renal Failure- acute on chronic KDIGO 3 -presumably due to shock physiology with hypotension induced ischemia and renal impairment -follow chem 7 -follow UO -continue Foley Catheter-assess need daily   Secondary adrenal insufficiency    - apparently developed after immunotherapy nivolumab    - on florinef  currently po  ID -DC all antibiotics  -follow up cultures  GI/Nutrition GI PROPHYLAXIS as indicated DIET-->TF's as tolerated Constipation protocol as indicated  ENDO - ICU hypoglycemic\Hyperglycemia protocol -check FSBS per  protocol   ELECTROLYTES -follow labs as needed -replace as needed -pharmacy consultation   DVT/GI PRX ordered -SCDs  TRANSFUSIONS AS NEEDED MONITOR FSBS ASSESS the need for LABS as needed    Critical care provider statement:   Total critical care time: 33 minutes   Performed by: Parris MD   Critical care time was exclusive of separately billable procedures and treating other patients.   Critical care was necessary to treat or prevent imminent or life-threatening deterioration.   Critical care was time spent personally by me on the following activities: development of treatment plan with patient and/or surrogate as well as nursing, discussions with consultants, evaluation of patient's response to treatment, examination of patient, obtaining history from patient or surrogate, ordering and performing treatments and interventions, ordering and review of laboratory studies, ordering and review of radiographic studies, pulse oximetry and re-evaluation of patient's condition.    Twan Harkin, M.D.  Pulmonary & Critical Care Medicine

## 2023-08-30 NOTE — ED Notes (Signed)
 Messaged provider to notify of pt's allergy to aspirin 

## 2023-08-30 NOTE — ED Notes (Signed)
 Critical lab result:     Lactic Acid: 3.1  Jess[place], MD made aware.

## 2023-08-30 NOTE — ED Notes (Signed)
 Pt transported to CT with RN at this time

## 2023-08-31 LAB — CBC
HCT: 37.3 % (ref 36.0–46.0)
Hemoglobin: 12.2 g/dL (ref 12.0–15.0)
MCH: 29.4 pg (ref 26.0–34.0)
MCHC: 32.7 g/dL (ref 30.0–36.0)
MCV: 89.9 fL (ref 80.0–100.0)
Platelets: 203 K/uL (ref 150–400)
RBC: 4.15 MIL/uL (ref 3.87–5.11)
RDW: 14 % (ref 11.5–15.5)
WBC: 24.6 K/uL — ABNORMAL HIGH (ref 4.0–10.5)
nRBC: 0 % (ref 0.0–0.2)

## 2023-08-31 LAB — GLUCOSE, CAPILLARY
Glucose-Capillary: 202 mg/dL — ABNORMAL HIGH (ref 70–99)
Glucose-Capillary: 270 mg/dL — ABNORMAL HIGH (ref 70–99)
Glucose-Capillary: 377 mg/dL — ABNORMAL HIGH (ref 70–99)
Glucose-Capillary: 319 mg/dL — ABNORMAL HIGH (ref 70–99)
Glucose-Capillary: 166 mg/dL — ABNORMAL HIGH (ref 70–99)

## 2023-08-31 LAB — BASIC METABOLIC PANEL WITH GFR
Anion gap: 10 (ref 5–15)
BUN: 30 mg/dL — ABNORMAL HIGH (ref 8–23)
CO2: 20 mmol/L — ABNORMAL LOW (ref 22–32)
Calcium: 8 mg/dL — ABNORMAL LOW (ref 8.9–10.3)
Chloride: 105 mmol/L (ref 98–111)
Creatinine, Ser: 1.65 mg/dL — ABNORMAL HIGH (ref 0.44–1.00)
GFR, Estimated: 33 mL/min — ABNORMAL LOW (ref >60)
Glucose, Bld: 244 mg/dL — ABNORMAL HIGH (ref 70–99)
Potassium: 4.5 mmol/L (ref 3.5–5.1)
Sodium: 135 mmol/L (ref 135–145)

## 2023-08-31 LAB — MAGNESIUM: Magnesium: 1.7 mg/dL (ref 1.7–2.4)

## 2023-08-31 LAB — HEMOGLOBIN A1C
Hgb A1c MFr Bld: 6.9 % — ABNORMAL HIGH (ref 4.8–5.6)
Mean Plasma Glucose: 151 mg/dL

## 2023-08-31 LAB — PHOSPHORUS: Phosphorus: 3.1 mg/dL (ref 2.5–4.6)

## 2023-08-31 MED ORDER — DEXAMETHASONE 0.5 MG PO TABS
1.0000 mg | ORAL_TABLET | Freq: Every day | ORAL | Status: AC
  Administered 2023-09-03: 1 mg via ORAL
  Filled 2023-08-31: qty 2

## 2023-08-31 MED ORDER — PREGABALIN 75 MG PO CAPS
150.0000 mg | ORAL_CAPSULE | Freq: Every morning | ORAL | Status: DC
  Administered 2023-08-31 – 2023-09-03 (×6): 150 mg via ORAL
  Filled 2023-08-31 (×4): qty 2

## 2023-08-31 MED ORDER — INSULIN ASPART 100 UNIT/ML IJ SOLN: 0.0000 [IU] | Freq: Three times a day (TID) | INTRAMUSCULAR | Status: DC

## 2023-08-31 MED ORDER — MAGNESIUM SULFATE 2 GM/50ML IV SOLN
2.0000 g | Freq: Once | INTRAVENOUS | Status: AC
  Administered 2023-08-31 (×2): 2 g via INTRAVENOUS
  Filled 2023-08-31: qty 50

## 2023-08-31 MED ORDER — INSULIN ASPART 100 UNIT/ML IJ SOLN
0.0000 [IU] | INTRAMUSCULAR | Status: DC
  Administered 2023-08-31 – 2023-09-01 (×4): 3 [IU] via SUBCUTANEOUS
  Administered 2023-09-01: 8 [IU] via SUBCUTANEOUS
  Administered 2023-09-01 (×3): 3 [IU] via SUBCUTANEOUS
  Administered 2023-09-01: 8 [IU] via SUBCUTANEOUS
  Administered 2023-09-01: 3 [IU] via SUBCUTANEOUS
  Administered 2023-09-01 (×2): 8 [IU] via SUBCUTANEOUS
  Administered 2023-09-02: 2 [IU] via SUBCUTANEOUS
  Administered 2023-09-02 (×2): 3 [IU] via SUBCUTANEOUS
  Administered 2023-09-02: 2 [IU] via SUBCUTANEOUS
  Filled 2023-08-31 (×9): qty 1

## 2023-08-31 MED ORDER — INSULIN ASPART 100 UNIT/ML IJ SOLN: 0.0000 [IU] | Freq: Every day | INTRAMUSCULAR | Status: DC

## 2023-08-31 MED ORDER — DEXAMETHASONE 0.5 MG PO TABS
2.0000 mg | ORAL_TABLET | Freq: Every day | ORAL | Status: AC
  Administered 2023-09-02: 2 mg via ORAL
  Filled 2023-08-31: qty 4

## 2023-08-31 MED ORDER — DEXAMETHASONE 0.5 MG PO TABS
3.0000 mg | ORAL_TABLET | Freq: Every day | ORAL | Status: AC
  Administered 2023-09-01 (×2): 3 mg via ORAL
  Filled 2023-08-31: qty 6

## 2023-08-31 MED ORDER — ROSUVASTATIN CALCIUM 10 MG PO TABS
10.0000 mg | ORAL_TABLET | Freq: Every day | ORAL | Status: DC
  Administered 2023-08-31 – 2023-09-03 (×6): 10 mg via ORAL
  Filled 2023-08-31 (×4): qty 1

## 2023-08-31 MED ORDER — INSULIN ASPART 100 UNIT/ML IJ SOLN
0.0000 [IU] | Freq: Three times a day (TID) | INTRAMUSCULAR | Status: DC
  Administered 2023-08-31 (×2): 9 [IU] via SUBCUTANEOUS
  Administered 2023-08-31: 3 [IU] via SUBCUTANEOUS
  Administered 2023-08-31: 5 [IU] via SUBCUTANEOUS
  Administered 2023-08-31: 3 [IU] via SUBCUTANEOUS
  Administered 2023-08-31: 5 [IU] via SUBCUTANEOUS
  Filled 2023-08-31 (×3): qty 1

## 2023-08-31 MED ORDER — PREGABALIN 75 MG PO CAPS
300.0000 mg | ORAL_CAPSULE | Freq: Every evening | ORAL | Status: DC
  Administered 2023-08-31 – 2023-09-02 (×5): 300 mg via ORAL
  Filled 2023-08-31 (×3): qty 4

## 2023-08-31 MED ORDER — HYDROCORTISONE 5 MG PO TABS: 5.0000 mg | ORAL_TABLET | Freq: Every day | ORAL | Status: DC

## 2023-08-31 MED ORDER — HYDROCORTISONE 5 MG PO TABS
15.0000 mg | ORAL_TABLET | Freq: Every day | ORAL | Status: DC
  Administered 2023-09-01 – 2023-09-03 (×4): 15 mg via ORAL
  Filled 2023-08-31 (×3): qty 1

## 2023-08-31 MED ORDER — INSULIN ASPART 100 UNIT/ML IJ SOLN
0.0000 [IU] | INTRAMUSCULAR | Status: DC
  Administered 2023-08-31 (×2): 15 [IU] via SUBCUTANEOUS
  Filled 2023-08-31: qty 1

## 2023-08-31 MED ORDER — HYDROCORTISONE 5 MG PO TABS
5.0000 mg | ORAL_TABLET | Freq: Every day | ORAL | Status: DC
  Administered 2023-09-01 – 2023-09-02 (×3): 5 mg via ORAL
  Filled 2023-08-31 (×3): qty 1

## 2023-08-31 NOTE — Plan of Care (Signed)
  Problem: Education: Goal: Knowledge of General Education information will improve Description: Including pain rating scale, medication(s)/side effects and non-pharmacologic comfort measures Outcome: Progressing   Problem: Health Behavior/Discharge Planning: Goal: Ability to manage health-related needs will improve Outcome: Progressing   Problem: Clinical Measurements: Goal: Ability to maintain clinical measurements within normal limits will improve Outcome: Progressing Goal: Respiratory complications will improve Outcome: Progressing Goal: Cardiovascular complication will be avoided Outcome: Progressing   Problem: Nutrition: Goal: Adequate nutrition will be maintained Outcome: Progressing   Problem: Coping: Goal: Level of anxiety will decrease Outcome: Progressing   

## 2023-08-31 NOTE — Inpatient Diabetes Management (Signed)
 Inpatient Diabetes Program Recommendations  AACE/ADA: New Consensus Statement on Inpatient Glycemic Control  Target Ranges:  Prepandial:   less than 140 mg/dL      Peak postprandial:   less than 180 mg/dL (1-2 hours)      Critically ill patients:  140 - 180 mg/dL    Latest Reference Range & Units 08/30/23 17:44 08/31/23 08:08 08/31/23 11:41  Glucose-Capillary 70 - 99 mg/dL 706 (H) 797 (H) 729 (H)   Review of Glycemic Control  Diabetes history: DM2 Outpatient Diabetes medications: Metformin  1000 mg BID Current orders for Inpatient glycemic control: Novolog  0-9 units TID with meals, Novolog  0-5 units at bedtime; Decadron  taper, Cortef  15 mg daily  Inpatient Diabetes Program Recommendations:    Insulin : If steroids are continued as ordered, please consider ordering Semglee 5 units Q24H and Novolog  2 units TID with meals for meal coverage if patient eats at least 50% of meals.  Diet: May want to consider discontinuing Regular diet and ordering Carb Modified diet.  Thanks, Earnie Gainer, RN, MSN, CDCES Diabetes Coordinator Inpatient Diabetes Program [phone] (Team Pager from 8am to 5pm)

## 2023-08-31 NOTE — Care Management Important Message (Signed)
 Important Message  Patient Details  Name: Sarah [name] MRN: [id number] Date of Birth: Oct 14, 1950   Important Message Given:  Yes - Medicare IM     Rojelio [name] 08/31/2023, 10:53 AM

## 2023-08-31 NOTE — Progress Notes (Signed)
 At this RN 0200 round found patient in room with IVs removed and trying to get to the bathroom. This RN and [employer] assisted patient to the bathroom where she peed and had a bowel movement. She was confused and had no idea where she was or why she was here. She was previously alert and oriented x4. Placed patient back into bed and placed back on tele monitor. She was able to recall the longer she was awake. Attempted to replace IVs but this RN unable so called IV team for placement. IV team placed 2 IVs and the patient is currently infusing Levo again as ordered. Almarie Nose, NP made aware with no new orders. After placement of IVs patient was alert and oriented x4 again. Vital signs stable, bed in the lowest position, call bell within reach, side rails up X2, bed alarm in place. Educated patient again on calling for help using the call bell and verbalized understanding. Will continue to monitor. Charge RN Hadassah made aware.

## 2023-08-31 NOTE — Progress Notes (Signed)
 CRITICAL CARE    Name: Sarah [name] MRN: [id number] DOB: [birth year]     LOS: 1   SUBJECTIVE FINDINGS & SIGNIFICANT EVENTS    History of Presenting Illness:   This is a 73 yo F with hx of dyslipidemia, HTN, DM2, neuropathy, depression , prolonged QT syndrome,  history of malignant melanoma, obesity, vitamin D  deficiency who came in after noting a worsening rash all over her body.  She noted erythematous papular prutic rash forming appx 24h after initiation of antimicrobial therapy.  She did have dental procedure performed recently.  She was prescribed clindamycin for this purpose and had developed rash after starting this therapy.  She recalls that when she had a previous infection of foot where she had a melanomatous lesion at the time she was also prescribed clindamycin and subsequently developed a rash quickly spread and required 1 week hospitalization at [facility].  She endorses pruritus on rash involved areas.  Her lactate was elevated on arrival and it was noted she was in shock physiology.  Despite receiving almost 3L of IVF she continued to be hypotensive and required IV levophed  for vasopressor support to keep BP MAP > 65.  She is able to speak, is AAOx4 and is accompanied by her daughter.  PCCM admission requriested due to anaphylactic shock requiring pressor support despite IVF.    08/31/23- patient with no overnight events, she's off vasopressors and is lucid. Renal failure is improved markedly.  Optimizing for TRH.  Tapering decadron  today.   Lines/tubes :   Microbiology/Sepsis markers: Results for orders placed or performed during the hospital encounter of 08/30/23  Respiratory (~20 pathogens) panel by PCR     Status: None   Collection Time: 08/30/23  3:06 PM   Specimen: Nasopharyngeal Swab;  Respiratory  Result Value Ref Range Status   Adenovirus NOT DETECTED NOT DETECTED Final   Coronavirus 229E NOT DETECTED NOT DETECTED Final    Comment: (NOTE) The Coronavirus on the Respiratory Panel, DOES NOT test for the novel  Coronavirus (2019 nCoV)    Coronavirus HKU1 NOT DETECTED NOT DETECTED Final   Coronavirus NL63 NOT DETECTED NOT DETECTED Final   Coronavirus OC43 NOT DETECTED NOT DETECTED Final   Metapneumovirus NOT DETECTED NOT DETECTED Final   Rhinovirus / Enterovirus NOT DETECTED NOT DETECTED Final   Influenza A NOT DETECTED NOT DETECTED Final   Influenza B NOT DETECTED NOT DETECTED Final   Parainfluenza Virus 1 NOT DETECTED NOT DETECTED Final   Parainfluenza Virus 2 NOT DETECTED NOT DETECTED Final   Parainfluenza Virus 3 NOT DETECTED NOT DETECTED Final   Parainfluenza Virus 4 NOT DETECTED NOT DETECTED Final   Respiratory Syncytial Virus NOT DETECTED NOT DETECTED Final   Bordetella pertussis NOT DETECTED NOT DETECTED Final   Bordetella Parapertussis NOT DETECTED NOT DETECTED Final   Chlamydophila pneumoniae NOT DETECTED NOT DETECTED Final   Mycoplasma pneumoniae NOT DETECTED NOT DETECTED Final    Comment: Performed at [facility], [address], [place], [place] [address]  MRSA Next Gen by PCR, Nasal     Status: None   Collection Time: 08/30/23  5:47 PM   Specimen: Nasal Mucosa; Nasal Swab  Result Value Ref Range Status   MRSA by PCR Next Gen NOT DETECTED NOT DETECTED Final    Comment: (NOTE) The GeneXpert MRSA Assay (FDA approved for NASAL specimens only), is one component of a comprehensive MRSA colonization surveillance program. It is not intended to diagnose MRSA infection nor to guide or  monitor treatment for MRSA infections. Test performance is not FDA approved in patients less than 13 years old. Performed at [facility], [address], [place], [place] [address]     Anti-infectives:  Anti-infectives (From admission, onward)    Start      Dose/Rate Route Frequency Ordered Stop   08/30/23 1200  ceFEPIme  (MAXIPIME ) 2 g in sodium chloride  0.9 % 100 mL IVPB        2 g 200 mL/hr over 30 Minutes Intravenous  Once 08/30/23 1155 08/30/23 1243   08/30/23 1200  metroNIDAZOLE  (FLAGYL ) IVPB 500 mg        500 mg 100 mL/hr over 60 Minutes Intravenous  Once 08/30/23 1155 08/30/23 1400   08/30/23 1200  vancomycin  (VANCOCIN ) IVPB 1000 mg/200 mL premix        1,000 mg 200 mL/hr over 60 Minutes Intravenous  Once 08/30/23 1155 08/30/23 1502        PAST MEDICAL HISTORY   Past Medical History:  Diagnosis Date   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HCC)    Adrenal insufficiency (HCC)    Anxiety    B12 deficiency    Cancer (HCC) 2018-2019   melanoma   Cervical radiculitis    Constipation    Dependent edema    Depression    Diabetes mellitus    PRE,NO MEDICATIONS AS OF 03/05/21   Dysmenorrhea    H/O: suicide attempt    History of antineoplastic chemotherapy    History of chemotherapy    Hyperlipidemia    Hypertension    Insomnia    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HCC)    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)    PMS (premenstrual syndrome)      SURGICAL HISTORY   Past Surgical History:  Procedure Laterality Date   BREAST BIOPSY Right    benign   CHOLECYSTECTOMY     COLONOSCOPY  11/08/2018   Danis-polyps   COLONOSCOPY  12/05/2019   Danis   DILATION AND CURETTAGE OF UTERUS     FOOT SURGERY Right 2018   removal of melanoma   LUMBAR FUSION     lymph node removal Right 2018   groin    POLYPECTOMY     TOTAL ABDOMINAL HYSTERECTOMY W/ BILATERAL SALPINGOOPHORECTOMY     TUBAL LIGATION       FAMILY HISTORY   Family History  Problem Relation Age of Onset   Depression Mother    Anxiety disorder Mother    Bipolar disorder Mother    Diabetes Mother    Mental illness Mother    Heart disease Mother    Stroke Mother    Lung cancer Father    Diabetes Maternal Grandmother    Heart attack Maternal Grandmother    CVA Paternal  Grandmother    Colon cancer Neg Hx    Liver cancer Neg Hx    Pancreatic cancer Neg Hx    Esophageal cancer Neg Hx    Stomach cancer Neg Hx    Rectal cancer Neg Hx    Colon polyps Neg Hx    Breast cancer Neg Hx      SOCIAL HISTORY   Social History   Tobacco Use   Smoking status: Never    Passive exposure: Never   Smokeless tobacco: Never  Vaping Use   Vaping status: Never Used  Substance Use Topics   Alcohol use: Not Currently   Drug use: Never     MEDICATIONS   Current Medication:  Current Facility-Administered Medications:  0.9 %  sodium chloride  infusion, 250 mL, Intravenous, Continuous, Willo Dunnings, MD, Held at 08/30/23 1334   acetaminophen  (TYLENOL ) tablet 650 mg, 650 mg, Oral, Q6H PRN, Thomasena Vandenheuvel, MD, 650 mg at 08/31/23 0857   Chlorhexidine  Gluconate Cloth 2 % PADS 6 each, 6 each, Topical, Daily, Holley Kocurek, MD, 6 each at 08/31/23 0841   dexamethasone  (DECADRON ) injection 4 mg, 4 mg, Intravenous, Daily, Carmen Vallecillo, MD, 4 mg at 08/31/23 9171   diphenhydrAMINE  (BENADRYL ) injection 12.5 mg, 12.5 mg, Intravenous, Q12H, Christyan Reger, MD, 12.5 mg at 08/31/23 9171   docusate sodium  (COLACE) capsule 100 mg, 100 mg, Oral, BID PRN, Jamair Cato, MD   famotidine  (PEPCID ) IVPB 20 mg premix, 20 mg, Intravenous, Q12H, Gilberta Peeters, MD, Stopped at 08/30/23 1741   fludrocortisone  (FLORINEF ) tablet 0.05 mg, 0.05 mg, Oral, Daily, Ian Castagna, MD, 0.05 mg at 08/31/23 0827   heparin  injection 5,000 Units, 5,000 Units, Subcutaneous, Q8H, Braxtyn Bojarski, MD, 5,000 Units at 08/31/23 9383   hydrocortisone  1 % lotion, , Topical, BID, Darious Rehman, MD, Given at 08/31/23 9160   insulin  aspart (novoLOG ) injection 0-5 Units, 0-5 Units, Subcutaneous, QHS, Nelson, Dana G, NP   insulin  aspart (novoLOG ) injection 0-9 Units, 0-9 Units, Subcutaneous, TID WC, Nelson, Dana G, NP, 3 Units at 08/31/23 9171   lactated ringers  infusion, , Intravenous, Continuous,  Parris Manna, MD, Last Rate: 40 mL/hr at 08/31/23 0831, Infusion Verify at 08/31/23 0831   magnesium  sulfate IVPB 2 g 50 mL, 2 g, Intravenous, Once, Maranda Lonell MATSU, NP, Last Rate: 50 mL/hr at 08/31/23 0838, 2 g at 08/31/23 9161   norepinephrine  (LEVOPHED ) 4mg  in (0.016 mg/mL) premix infusion, 0-10 mcg/min, Intravenous, Titrated, Willo Dunnings, MD, Stopped at 08/31/23 0710   polyethylene glycol (MIRALAX  / GLYCOLAX ) packet 17 g, 17 g, Oral, Daily PRN, Nikia Mangino, MD   pregabalin  (LYRICA ) capsule 150 mg, 150 mg, Oral, q morning, Nelson, Dana G, NP, 150 mg at 08/31/23 9143   pregabalin  (LYRICA ) capsule 300 mg, 300 mg, Oral, QPM, Nelson, Dana G, NP   rosuvastatin  (CRESTOR ) tablet 10 mg, 10 mg, Oral, Daily, Nelson, Dana G, NP, 10 mg at 08/31/23 0856    ALLERGIES   Clindamycin/lincomycin, Doxycycline , Aspirin , Ciprofloxacin, and Penicillins    REVIEW OF SYSTEMS     10 point ROS done and is negative except for pruritic rash otherwise as per subjective findings and h/p  PHYSICAL EXAMINATION   Vital Signs: Temp:  [98.1 F (36.7 C)-100.1 F (37.8 C)] 98.4 F (36.9 C) (08/12 0800) Pulse Rate:  [34-114] 79 (08/12 0830) Resp:  [12-38] 16 (08/12 0830) BP: (61-140)/(36-106) 109/47 (08/12 0830) SpO2:  [87 %-100 %] 94 % (08/12 0830) Weight:  [105.7 kg-115.2 kg] 115.2 kg (08/12 0423)  GENERAL:NAD age appropriate , obese  HEAD: Normocephalic, atraumatic.  EYES: Pupils equal, round, reactive to light.  No scleral icterus.  MOUTH: Moist mucosal membrane. NECK: Supple. No thyromegaly. No nodules. No JVD.  PULMONARY: CTAB CARDIOVASCULAR: S1 and S2. Regular rate and rhythm. No murmurs, rubs, or gallops.  GASTROINTESTINAL: Soft, nontender, non-distended. No masses. Positive bowel sounds. No hepatosplenomegaly.  MUSCULOSKELETAL: No swelling, clubbing, or edema.  NEUROLOGIC: Mild distress due to acute illness SKIN: erythematous, raised papular rash with pruritus on neck, torso,  exremeties   PERTINENT DATA     Infusions:  sodium chloride  Stopped (08/30/23 1334)   famotidine  (PEPCID ) IV Stopped (08/30/23 1741)   lactated ringers  40 mL/hr at 08/31/23 0831   magnesium  sulfate bolus IVPB 2 g (  08/31/23 9161)   norepinephrine  (LEVOPHED ) Adult infusion Stopped (08/31/23 0710)   Scheduled Medications:  Chlorhexidine  Gluconate Cloth  6 each Topical Daily   dexamethasone  (DECADRON ) injection  4 mg Intravenous Daily   diphenhydrAMINE   12.5 mg Intravenous Q12H   fludrocortisone   0.05 mg Oral Daily   heparin   5,000 Units Subcutaneous Q8H   hydrocortisone    Topical BID   insulin  aspart  0-5 Units Subcutaneous QHS   insulin  aspart  0-9 Units Subcutaneous TID WC   pregabalin   150 mg Oral q morning   pregabalin   300 mg Oral QPM   rosuvastatin   10 mg Oral Daily   PRN Medications: acetaminophen , docusate sodium , polyethylene glycol Hemodynamic parameters:   Intake/Output: 08/11 0701 - 08/12 0700 In: 4105.3 [I.V.:761; IV Piggyback:3344.3] Out: 1000 [Urine:1000]  Ventilator  Settings:     LAB RESULTS:  Basic Metabolic Panel: Recent Labs  Lab 08/30/23 1147 08/31/23 0514  NA 137 135  K 4.4 4.5  CL 101 105  CO2 22 20*  GLUCOSE 162* 244*  BUN 29* 30*  CREATININE 2.27* 1.65*  CALCIUM  8.4* 8.0*  MG  --  1.7  PHOS  --  3.1   Liver Function Tests: Recent Labs  Lab 08/30/23 1147  AST 22  ALT 20  ALKPHOS 59  BILITOT 1.2  PROT 6.1*  ALBUMIN 3.1*   Recent Labs  Lab 08/30/23 1506  LIPASE 22   No results for input(s): AMMONIA in the last 168 hours. CBC: Recent Labs  Lab 08/30/23 1147 08/31/23 0514  WBC 16.5* 24.6*  NEUTROABS 11.9*  --   HGB 12.7 12.2  HCT 37.9 37.3  MCV 88.8 89.9  PLT 243 203   Cardiac Enzymes: No results for input(s): CKTOTAL, CKMB, CKMBINDEX, TROPONINI in the last 168 hours. BNP: Invalid input(s): POCBNP CBG: Recent Labs  Lab 08/30/23 1744 08/31/23 0808  GLUCAP 293* 202*       IMAGING  RESULTS:     ASSESSMENT AND PLAN    -Multidisciplinary rounds held today  Circulatory shock     Due to anaphylaxis from recent antibiotic therapy with clindamycin   -present on admission    - IVF   - IV steroids    - topical hydrocortisone  1% bid to affected areas    - Currently on low dose levophed  , I'm hoping we would not need central line and can wean off pressor support within few hours or so. Currently on low dose peripherally -IV LR 50cc/hr  Exanthematous drug reaction   - patient does not have mucosal disruption and does not appear to have SJS/TEN  -currently with pruritus but no painful or blistering lesions  - s/p solucortef 100mg  x 1, now transitioned to lower dose decadron  due to DM  - continue topical 1% hydrocortisone  lotion  - continue pepcid  IV bid    - cotninue diphenhydramine  12.5 iV bid   - monitor for worsening of skin lesion ICU monitoring telemetry   Renal Failure- acute on chronic KDIGO 3 -presumably due to shock physiology with hypotension induced ischemia and renal impairment -follow chem 7 -follow UO -continue Foley Catheter-assess need daily   Secondary adrenal insufficiency    - apparently developed after immunotherapy nivolumab    - on florinef  currently po  ID -DC all antibiotics  -follow up cultures  GI/Nutrition GI PROPHYLAXIS as indicated DIET-->TF's as tolerated Constipation protocol as indicated  ENDO - ICU hypoglycemic\Hyperglycemia protocol -check FSBS per protocol   ELECTROLYTES -follow labs as needed -replace as needed -  pharmacy consultation   DVT/GI PRX ordered -SCDs  TRANSFUSIONS AS NEEDED MONITOR FSBS ASSESS the need for LABS as needed    Critical care provider statement:   Total critical care time: 33 minutes   Performed by: Parris MD   Critical care time was exclusive of separately billable procedures and treating other patients.   Critical care was necessary to treat or prevent imminent or  life-threatening deterioration.   Critical care was time spent personally by me on the following activities: development of treatment plan with patient and/or surrogate as well as nursing, discussions with consultants, evaluation of patient's response to treatment, examination of patient, obtaining history from patient or surrogate, ordering and performing treatments and interventions, ordering and review of laboratory studies, ordering and review of radiographic studies, pulse oximetry and re-evaluation of patient's condition.    Suraiya Dickerson, M.D.  Pulmonary & Critical Care Medicine

## 2023-08-31 NOTE — Evaluation (Addendum)
 Clinical/Bedside Swallow Evaluation Patient Details  Name: Sarah [name] MRN: [id number] Date of Birth: 1950-08-08  Today's Date: 08/31/2023 Time: SLP Start Time (ACUTE ONLY): 1225 SLP Stop Time (ACUTE ONLY): 1325 SLP Time Calculation (min) (ACUTE ONLY): 60 min  Past Medical History:  Past Medical History:  Diagnosis Date   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HCC)    Adrenal insufficiency (HCC)    Anxiety    B12 deficiency    Cancer (HCC) 2018-2019   melanoma   Cervical radiculitis    Constipation    Dependent edema    Depression    Diabetes mellitus    PRE,NO MEDICATIONS AS OF 03/05/21   Dysmenorrhea    H/O: suicide attempt    History of antineoplastic chemotherapy    History of chemotherapy    Hyperlipidemia    Hypertension    Insomnia    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HCC)    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)    PMS (premenstrual syndrome)    Past Surgical History:  Past Surgical History:  Procedure Laterality Date   BREAST BIOPSY Right    benign   CHOLECYSTECTOMY     COLONOSCOPY  11/08/2018   [facility]-polyps   COLONOSCOPY  12/05/2019   [facility]   DILATION AND CURETTAGE OF UTERUS     FOOT SURGERY Right 2018   removal of melanoma   LUMBAR FUSION     lymph node removal Right 2018   groin    POLYPECTOMY     TOTAL ABDOMINAL HYSTERECTOMY W/ BILATERAL SALPINGOOPHORECTOMY     TUBAL LIGATION     HPI:  Pt is a 73 yo F with hx of dyslipidemia, Obesity, HTN, DM2, neuropathy, Addison's Dis., depression , prolonged QT syndrome, history of malignant melanoma/Ca, vitamin D  deficiency who came in after noting a worsening rash all over her body.  She noted erythematous papular prutic rash forming appx 24h after initiation of antimicrobial therapy.  She did have dental procedure performed recently.  She was prescribed clindamycin for this purpose and had developed rash after starting this therapy.  She recalls that when she had a previous infection of foot where she had a melanomatous lesion at  the time she was also prescribed clindamycin and subsequently developed a rash quickly spread and required 1 week hospitalization at [facility].  In the ED, she is able to speak, is AAOx4 and is accompanied by her daughter.  PCCM admission requriested due to anaphylactic shock requiring pressor support despite IVF.    CT of Chest:  1. No acute findings.  2. Steatotic enlarged liver.  3. Tiny upper lobe pulmonary nodules. No follow-up needed if patient  is low-risk (and has no known or suspected primary neoplasm).  Non-contrast chest CT can be considered in 12 months if patient is  high-risk.  4. Aortic atherosclerosis (ICD10-I70.0). Coronary artery  calcification.  HEAD CT:  Generalized cerebral atrophy with chronic white matter small  vessel ischemic changes.  2. Chronic left cerebellar infarct.  3. No acute intracranial abnormality.    Assessment / Plan / Recommendation  Clinical Impression   Pt seen today for BSE; Daughter present in room. Pt endorsed Baseline s/s of GERD and Esophageal phase Dysmotility.      Pt seen for BSE this morning. Pt awake, verbal and followed instructions adequately w/ cue. Noted a more reclined positioning in bed; NSG reported pt does not sit fully upright during oral intake at times. Daughter present in room. Pt endorses Baseline Esophageal phase Dysmotility- takes pepcid   at home several times a month. Noted pt had a MBSS in 2019 at [facility] secondary to similar c/o when I eat it gets stuck right in my throat and won't go down or come up; also coughing with meals sometimes.  At the mbss-  no airway invasion observed; a GI consult was rec'd then. Pt has Not followed up w/ GI per her report. Pt/NSG endorsed difficulty swallowing french toast this morning.  On RA, afebrile, WBC not elevated.   OF NOTE: Pt endorses s/s of REFLUX at home; [name]-medicates. Has not followed w/ GI.   Pt appears to present w/ functional oropharyngeal phase swallowing w/ No overt oropharyngeal  phase dysphagia appreciated during oral intake of trials; No neuromuscular swallowing deficits appreciated. Pt appears at reduced risk for aspiration from an oropharyngeal phase standpoint following general aspiration precautions. HOWEVER, pt has a baseline presentation of REFLUX and episodes of REFLUX activity. She is not on a PPI. This has been BASELINE for her since initially reported/assessed in 2019 per chart notes.  ANY Esophageal phase Dysmotility or Regurgitation of Reflux material can increase risk for aspiration of the Reflux material during Retrograde flow thus impact Voicing and Pulmonary status. Pt described issues of globus, dry throat clearing, and hacking cough often around oral intake.    Pt sat upright in bed w/ Encouragement and consumed several trials of thin liquids via Cup/Straw, purees, and moistened, cut solid foods w/ No overt clinical s/s of aspiration noted; clear vocal quality b/t trials, no decline in pulmonary status, no cough, no decline in O2 sats(99%). Oral phase appeared [facility] for bolus management and timely A-P transfer/clearing of material. Mastication appropriate for boluses. OM exam was [facility] for oral clearing; lingual/labial movements. No unilateral weakness. Speech clear, intelligible.    Recommend continue a Regular diet (moistened foods) w/ thin liquids. General aspiration precautions. Rest Breaks during meals/oral intake to allow for Esophageal clearing and strongly recommend REFLUX precautions to lessen chance for Regurgitation -- remain upright post meals for ~1 hour and HOB elevated at night when sleeping.  Consulted MD re: a PPI for pt and f/u w/ GI. This was discussed w/ pt and Daughter present.    Recommend pt f/u w/ GI for assessment/management of REFLUX and tx as indicated. Discussion and handouts given on REFLUX, behaviors to manage REFLUX, and foods/diet. MD to reconsult ST services if any new needs while admitted. NSG updated. Pt appreciative of Education  information. Precautions posted.  SLP Visit Diagnosis: Dysphagia, unspecified (R13.10) (Esophageal phase Dysmotility; GERD s/s)    Aspiration Risk   (reduced from an oropharyngeal phase standpoint but increased from an Esophageal phase standpoint (Regurgitation risk))    Diet Recommendation   Thin;Age appropriate regular (w/ cut meats, moistened foods. LESS breads and meat in diet and LESS problematic foods) = cut, small pieces of foods moistened well. REFLUX Precautions -- including Rest Breaks during meals to allow for Esophageal clearing b/t bites. General aspiration precautions. Tray setup and support sitting Fully upright for oral intake. Remain sitting up post meals for ~1 hour. GI f/u.   Medication Administration: Whole meds with puree    Other  Recommendations Recommended Consults: Consider GI evaluation;Consider esophageal assessment (Dietician) Oral Care Recommendations: Oral care BID;Patient independent with oral care     Assistance Recommended at Discharge  Intermittent for sitting up support  Functional Status Assessment Patient has not had a recent decline in their functional status; reported s/s of Esophageal phase Dysmotility  Frequency and Duration  (n/a)   (  n/a)       Prognosis Prognosis for improved oropharyngeal function: Fair (-Good) Barriers to Reach Goals: Time post onset;Severity of deficits Barriers/Prognosis Comment: Esophageal phase Dysmotility; GERD s/s      Swallow Study   General Date of Onset: 08/30/23 HPI: Pt is a 73 yo F with hx of dyslipidemia, Obesity, HTN, DM2, neuropathy, Addison's Dis., depression , prolonged QT syndrome, history of malignant melanoma/Ca, vitamin D  deficiency who came in after noting a worsening rash all over her body.  She noted erythematous papular prutic rash forming appx 24h after initiation of antimicrobial therapy.  She did have dental procedure performed recently.  She was prescribed clindamycin for this purpose and had  developed rash after starting this therapy.  She recalls that when she had a previous infection of foot where she had a melanomatous lesion at the time she was also prescribed clindamycin and subsequently developed a rash quickly spread and required 1 week hospitalization at [facility].  In the ED, she is able to speak, is AAOx4 and is accompanied by her daughter.  PCCM admission requriested due to anaphylactic shock requiring pressor support despite IVF.    CT of Chest:  1. No acute findings.  2. Steatotic enlarged liver.  3. Tiny upper lobe pulmonary nodules. No follow-up needed if patient  is low-risk (and has no known or suspected primary neoplasm).  Non-contrast chest CT can be considered in 12 months if patient is  high-risk.  4. Aortic atherosclerosis (ICD10-I70.0). Coronary artery  calcification.  HEAD CT:  Generalized cerebral atrophy with chronic white matter small  vessel ischemic changes.  2. Chronic left cerebellar infarct.  3. No acute intracranial abnormality. Type of Study: Bedside Swallow Evaluation Previous Swallow Assessment: mbss in 2019 at [facility] secondary to similar c/o when I eat it gets stuck right in my throat and won't go down or come up; also coughing with meals sometimes.  At the mbss-  no airway invasion observed; a GI consult was rec'd then. Pt has Not followed up w/ GI per her report. Diet Prior to this Study: Regular;Thin liquids (Level 0) (made NPO this morning post coughing while eating french toast) Temperature Spikes Noted: No Respiratory Status: Room air History of Recent Intubation: No Behavior/Cognition: Alert;Cooperative;Pleasant mood;Requires cueing Oral Cavity Assessment: Within Functional Limits Oral Care Completed by SLP: Yes Oral Cavity - Dentition: Adequate natural dentition (recent tooth issues; on antibiotics) Vision: Functional for [name]-feeding [name]-Feeding Abilities: Able to feed [name];Needs set up;Needs assist Patient Positioning: Upright in bed  (full encouragement/support) Baseline Vocal Quality: Normal Volitional Cough: Strong Volitional Swallow: Able to elicit    Oral/Motor/Sensory Function Overall Oral Motor/Sensory Function: Within functional limits   Ice Chips Ice chips: Not tested   Thin Liquid Thin Liquid: Within functional limits Presentation: [name] Fed;Cup;Straw (3 sips via each)    Nectar Thick Nectar Thick Liquid: Not tested   Honey Thick Honey Thick Liquid: Not tested   Puree Puree: Within functional limits Presentation: [name] Fed;Spoon (3 trials)   Solid     Solid: Within functional limits (moistened) Presentation: [name] Fed (5 trials)         Comer Portugal, MS, CCC-SLP Speech Language Pathologist Rehab Services; [facility] -  [phone] (ascom) Shraddha Lebron 08/31/2023,5:56 PM

## 2023-08-31 NOTE — Consult Note (Signed)
 PHARMACY CONSULT NOTE - ELECTROLYTES  Pharmacy Consult for Electrolyte Monitoring and Replacement   Recent Labs: Height: 5' 4 (162.6 cm) Weight: 115.2 kg (253 lb 15.5 oz) IBW/kg (Calculated) : 54.7 Estimated Creatinine Clearance: 38.4 mL/min (A) (by C-G formula based on SCr of 1.65 mg/dL (H)). Potassium (mmol/L)  Date Value  08/31/2023 4.5  06/04/2011 3.4 (L)   Magnesium  (mg/dL)  Date Value  91/87/7974 1.7  06/04/2011 1.9   Calcium  (mg/dL)  Date Value  91/87/7974 8.0 (L)   Calcium , Total (mg/dL)  Date Value  94/83/7986 7.5 (L)   Albumin (g/dL)  Date Value  91/88/7974 3.1 (L)  04/15/2023 4.1  06/03/2011 3.3 (L)   Phosphorus (mg/dL)  Date Value  91/87/7974 3.1   Sodium (mmol/L)  Date Value  08/31/2023 135  04/15/2023 140  06/04/2011 146 (H)    Assessment  Sarah [name] is a 73 y.o. female presenting with worsening rash. PMH significant for dyslipidemia, HTN, DM2, neuropathy, depression , prolonged QT syndrome, history of malignant melanoma, obesity, vitamin D  deficiency . Pharmacy has been consulted to monitor and replace electrolytes.  Diet: regular MIVF: LR @ 40 mL/hr Pertinent medications: N/A  Goal of Therapy: Electrolytes WNL  Plan:  Mag 1.7: Mag sulfate 2 IV x 1. Check BMP, Mg, Phos with AM labs  Thank you for allowing pharmacy to be a part of this patient's care.  Jaiya Mooradian A Gabreille Dardis, PharmD Clinical Pharmacist 08/31/2023 2:08 PM

## 2023-09-01 LAB — BASIC METABOLIC PANEL WITH GFR
Anion gap: 9 (ref 5–15)
BUN: 27 mg/dL — ABNORMAL HIGH (ref 8–23)
CO2: 24 mmol/L (ref 22–32)
Calcium: 8.2 mg/dL — ABNORMAL LOW (ref 8.9–10.3)
Chloride: 104 mmol/L (ref 98–111)
Creatinine, Ser: 1.18 mg/dL — ABNORMAL HIGH (ref 0.44–1.00)
GFR, Estimated: 49 mL/min — ABNORMAL LOW (ref >60)
Glucose, Bld: 161 mg/dL — ABNORMAL HIGH (ref 70–99)
Potassium: 4.3 mmol/L (ref 3.5–5.1)
Sodium: 137 mmol/L (ref 135–145)

## 2023-09-01 LAB — CBC
HCT: 32.8 % — ABNORMAL LOW (ref 36.0–46.0)
Hemoglobin: 10.9 g/dL — ABNORMAL LOW (ref 12.0–15.0)
MCH: 29 pg (ref 26.0–34.0)
MCHC: 33.2 g/dL (ref 30.0–36.0)
MCV: 87.2 fL (ref 80.0–100.0)
Platelets: 219 K/uL (ref 150–400)
RBC: 3.76 MIL/uL — ABNORMAL LOW (ref 3.87–5.11)
RDW: 13.7 % (ref 11.5–15.5)
WBC: 25.7 K/uL — ABNORMAL HIGH (ref 4.0–10.5)
nRBC: 0 % (ref 0.0–0.2)

## 2023-09-01 LAB — GLUCOSE, CAPILLARY
Glucose-Capillary: 169 mg/dL — ABNORMAL HIGH (ref 70–99)
Glucose-Capillary: 154 mg/dL — ABNORMAL HIGH (ref 70–99)
Glucose-Capillary: 154 mg/dL — ABNORMAL HIGH (ref 70–99)
Glucose-Capillary: 263 mg/dL — ABNORMAL HIGH (ref 70–99)
Glucose-Capillary: 280 mg/dL — ABNORMAL HIGH (ref 70–99)

## 2023-09-01 LAB — PHOSPHORUS: Phosphorus: 2.5 mg/dL (ref 2.5–4.6)

## 2023-09-01 LAB — MAGNESIUM: Magnesium: 2.1 mg/dL (ref 1.7–2.4)

## 2023-09-01 MED ORDER — LACTATED RINGERS IV SOLN: INTRAVENOUS | Status: DC

## 2023-09-01 MED ORDER — MAGIC MOUTHWASH W/LIDOCAINE
5.0000 mL | Freq: Three times a day (TID) | ORAL | Status: DC | PRN
  Filled 2023-09-01: qty 5

## 2023-09-01 MED ORDER — MAGIC MOUTHWASH
5.0000 mL | Freq: Three times a day (TID) | ORAL | Status: DC | PRN
  Administered 2023-09-01 – 2023-09-02 (×3): 5 mL via ORAL
  Filled 2023-09-01 (×3): qty 5

## 2023-09-01 MED ORDER — LIDOCAINE VISCOUS HCL 2 % MT SOLN: 5.0000 mL | OROMUCOSAL | Status: DC | PRN

## 2023-09-01 MED ORDER — FAMOTIDINE 20 MG PO TABS
20.0000 mg | ORAL_TABLET | Freq: Two times a day (BID) | ORAL | Status: DC
  Administered 2023-09-01 – 2023-09-03 (×7): 20 mg via ORAL
  Filled 2023-09-01 (×5): qty 1

## 2023-09-01 MED ORDER — ENOXAPARIN SODIUM 60 MG/0.6ML IJ SOSY
60.0000 mg | PREFILLED_SYRINGE | INTRAMUSCULAR | Status: DC
  Administered 2023-09-01 – 2023-09-02 (×3): 60 mg via SUBCUTANEOUS
  Filled 2023-09-01 (×2): qty 0.6

## 2023-09-01 NOTE — Inpatient Diabetes Management (Signed)
 Inpatient Diabetes Program Recommendations  AACE/ADA: New Consensus Statement on Inpatient Glycemic Control   Target Ranges:  Prepandial:   less than 140 mg/dL      Peak postprandial:   less than 180 mg/dL (1-2 hours)      Critically ill patients:  140 - 180 mg/dL    Latest Reference Range & Units 08/31/23 08:08 08/31/23 11:41 08/31/23 16:23 08/31/23 19:29 08/31/23 23:11 09/01/23 03:17  Glucose-Capillary 70 - 99 mg/dL 797 (H) 729 (H) 622 (H) 319 (H) 166 (H) 169 (H)   Review of Glycemic Control  Diabetes history: DM2 Outpatient Diabetes medications: Metformin  1000 mg BID Current orders for Inpatient glycemic control: Novolog  0-9 units TID with meals, Novolog  0-5 units at bedtime; Decadron  taper, Cortef  15 mg daily   Inpatient Diabetes Program Recommendations:     Insulin : If steroids are continued as ordered, please consider ordering Semglee 5 units Q24H and Novolog  3 units TID with meals for meal coverage if patient eats at least 50% of meals.   Diet: May want to consider discontinuing Regular diet and ordering Carb Modified diet  Thanks,  Earnie Gainer, RN, MSN, CDCES Diabetes Coordinator Inpatient Diabetes Program [phone] (Team Pager from 8am to 5pm)

## 2023-09-01 NOTE — Evaluation (Signed)
 Physical Therapy Evaluation Patient Details Name: Sarah [name] MRN: [id number] DOB: [birth year] Today's Date: 09/01/2023  History of Present Illness  73 yo F with hx of dyslipidemia, HTN, DM2, neuropathy, depression , prolonged QT syndrome,  history of malignant melanoma, obesity, vitamin D  deficiency who came in after noting a worsening rash all over her body.  Admitted for anaphylactic shock.  Clinical Impression  Pt pleasant and eager to work with PT despite some discomfort from global rash.  She was able to easily perform bed mobility and transition to standing and ultimately did well with a prolonged bout of ambulation at near community appropriate speed.  She did have a few small stagger steps with ambulation trial w/o AD, recommending RW for home, will maintain on PT caseload while here for RW/gait training purposes, but do not suspect she will need further PT intervention once medically ready to go home.       If plan is discharge home, recommend the following:     Can travel by private vehicle        Equipment Recommendations Rolling walker (2 wheels)  Recommendations for Other Services       Functional Status Assessment Patient has had a recent decline in their functional status and demonstrates the ability to make significant improvements in function in a reasonable and predictable amount of time.     Precautions / Restrictions Precautions Precautions: Fall Restrictions Weight Bearing Restrictions Per Provider Order: No      Mobility  Bed Mobility Overal bed mobility: Independent             General bed mobility comments: easily transitioned to sitting EOB    Transfers Overall transfer level: Independent Equipment used: Rolling walker (2 wheels)               General transfer comment: able to stand w/o assist, minimaluse of walker to initially stabilize    Ambulation/Gait Ambulation/Gait assistance: Supervision Gait Distance (Feet): 300  Feet Assistive device: Rolling walker (2 wheels), None         General Gait Details: Pt was able to ambulate with an easy confidence, consistent cadence, community appropriate speed.  She did have slower gait with ~30 ft of ambulation w/o AD along with some stagger stepping.  Encouarged use of walker for the time-being.  Stairs            Wheelchair Mobility     Tilt Bed    Modified Rankin (Stroke Patients Only)       Balance Overall balance assessment: Needs assistance Sitting-balance support: No upper extremity supported Sitting balance-Leahy Scale: Normal     Standing balance support: Bilateral upper extremity supported Standing balance-Leahy Scale: Good Standing balance comment: good balance w/ AD, did have minimal stagger stepping when walking w/o AD                             Pertinent Vitals/Pain Pain Assessment Pain Assessment: 0-10 Pain Score: 4  Pain Location: rash, especially back    Home Living Family/patient expects to be discharged to:: Private residence Living Arrangements: Alone Available Help at Discharge: Available PRN/intermittently (house keeper monthly, daughter will stop by on occasion PRN) Type of Home:  [facility] at [facility]) Home Access: Level entry       Home Layout: One level Home Equipment: Shower seat - built in;Grab bars - toilet;Grab bars - tub/shower;Hand held shower head      Prior Function Prior  Level of Function : Independent/Modified Independent             Mobility Comments: reports driving and out in the community multiple times/wk, does her own shopping, errands, etc ADLs Comments: independent, apart from house keeper monthly     Extremity/Trunk Assessment   Upper Extremity Assessment Upper Extremity Assessment: Overall WFL for tasks assessed;Generalized weakness    Lower Extremity Assessment Lower Extremity Assessment: Overall WFL for tasks assessed;Generalized weakness       Communication    Communication Communication: No apparent difficulties    Cognition Arousal: Alert Behavior During Therapy: WFL for tasks assessed/performed   PT - Cognitive impairments: No apparent impairments                       PT - Cognition Comments: knew August 2025, but not date, otherwise able to answer recent and distance history questions w/o issue Following commands: Intact       Cueing Cueing Techniques: Verbal cues     General Comments General comments (skin integrity, edema, etc.): Pt did well with all aspects of PT eval    Exercises     Assessment/Plan    PT Assessment Patient needs continued PT services  PT Problem List Decreased knowledge of use of DME       PT Treatment Interventions Patient/family education;Gait training;Functional mobility training;Therapeutic activities;Therapeutic exercise;Balance training;Neuromuscular re-education    PT Goals (Current goals can be found in the Care Plan section)  Acute Rehab PT Goals Patient Stated Goal: go home tomorrow PT Goal Formulation: With patient Time For Goal Achievement: 09/14/23 Potential to Achieve Goals: Good    Frequency Min 1X/week     Co-evaluation               AM-PAC PT 6 Clicks Mobility  Outcome Measure Help needed turning from your back to your side while in a flat bed without using bedrails?: None Help needed moving from lying on your back to sitting on the side of a flat bed without using bedrails?: None Help needed moving to and from a bed to a chair (including a wheelchair)?: None Help needed standing up from a chair using your arms (e.g., wheelchair or bedside chair)?: None Help needed to walk in hospital room?: A Little Help needed climbing 3-5 steps with a railing? : A Little 6 Click Score: 22    End of Session   Activity Tolerance: Patient tolerated treatment well Patient left: in chair;with call bell/phone within reach (OT hand-off)   PT Visit Diagnosis: Unsteadiness  on feet (R26.81)    Time: 8481-8465 PT Time Calculation (min) (ACUTE ONLY): 16 min   Charges:   PT Evaluation $PT Eval Low Complexity: 1 Low   PT General Charges $$ ACUTE PT VISIT: 1 Visit         Carmin JONELLE Deed, DPT 09/01/2023, 3:51 PM

## 2023-09-01 NOTE — Consult Note (Signed)
 PHARMACY CONSULT NOTE - ELECTROLYTES  Pharmacy Consult for Electrolyte Monitoring and Replacement   Recent Labs: Potassium (mmol/L)  Date Value  09/01/2023 4.3  06/04/2011 3.4 (L)   Magnesium  (mg/dL)  Date Value  91/86/7974 2.1  06/04/2011 1.9   Calcium  (mg/dL)  Date Value  91/86/7974 8.2 (L)   Calcium , Total (mg/dL)  Date Value  94/83/7986 7.5 (L)   Albumin (g/dL)  Date Value  91/88/7974 3.1 (L)  04/15/2023 4.1  06/03/2011 3.3 (L)   Phosphorus (mg/dL)  Date Value  91/86/7974 2.5   Sodium (mmol/L)  Date Value  09/01/2023 137  04/15/2023 140  06/04/2011 146 (H)   Corrected Ca: 8.9 mg/dL  Height: 5' 4 (837.3 cm) Weight: 117.6 kg (259 lb 4.2 oz) IBW/kg (Calculated) : 54.7 Estimated Creatinine Clearance: 54.4 mL/min (A) (by C-G formula based on SCr of 1.18 mg/dL (H)).  Assessment  Sarah [name] is a 73 y.o. female presenting with anaphylaxis from clindamycin. PMH significant for HLD, HTN, T2DM, neuropathy,and history of malignant melanoma. Pharmacy has been consulted to monitor and replace electrolytes.  Diet: With assistance MIVF: N/A Pertinent medications: N/A  Goal of Therapy: Electrolytes within normal limits  Plan:  No supplementation or correction needed at this time  Continue to monitor electrolytes, including phosphorous, daily   Thank you for allowing pharmacy to be a part of this patient's care.  Helaina Stefano [place], PharmD Candidate 09/01/2023 8:20 AM

## 2023-09-01 NOTE — Progress Notes (Signed)
 CRITICAL CARE    Name: Sarah [name] MRN: [id number] DOB: 01-02-1951     LOS: 2   SUBJECTIVE FINDINGS & SIGNIFICANT EVENTS    History of Presenting Illness:   This is a 73 yo F with hx of dyslipidemia, HTN, DM2, neuropathy, depression , prolonged QT syndrome,  history of malignant melanoma, obesity, vitamin D  deficiency who came in after noting a worsening rash all over her body.  She noted erythematous papular prutic rash forming appx 24h after initiation of antimicrobial therapy.  She did have dental procedure performed recently.  She was prescribed clindamycin for this purpose and had developed rash after starting this therapy.  She recalls that when she had a previous infection of foot where she had a melanomatous lesion at the time she was also prescribed clindamycin and subsequently developed a rash quickly spread and required 1 week hospitalization at [facility].  She endorses pruritus on rash involved areas.  Her lactate was elevated on arrival and it was noted she was in shock physiology.  Despite receiving almost 3L of IVF she continued to be hypotensive and required IV levophed  for vasopressor support to keep BP MAP > 65.  She is able to speak, is AAOx4 and is accompanied by her daughter.  PCCM admission requriested due to anaphylactic shock requiring pressor support despite IVF.    08/31/23- patient with no overnight events, she's off vasopressors and is lucid. Renal failure is improved markedly.  Optimizing for TRH.  Tapering decadron  today.  09/01/23- patient seen at bedside, she reports improvement in skin erythema, pruritus, and rash size overall.  She had swallow study and showed no issues swallowing related to any swelling instead findings consistent with GERD was noted and recommendation for outpatient  GERD eval.  Were currently treating her with pepcid  bid and she has no reflux esophagitis clinically.  She is being optimized for TRH transfer.   Lines/tubes :   Microbiology/Sepsis markers: Results for orders placed or performed during the hospital encounter of 08/30/23  Culture, blood (routine x 2)     Status: None (Preliminary result)   Collection Time: 08/30/23 11:48 AM   Specimen: BLOOD  Result Value Ref Range Status   Specimen Description BLOOD BLOOD RIGHT FOREARM  Final   Special Requests   Final    BOTTLES DRAWN AEROBIC AND ANAEROBIC Blood Culture results may not be optimal due to an inadequate volume of blood received in culture bottles   Culture   Final    NO GROWTH 2 DAYS Performed at [facility], [address], [place], [place] [address]    Report Status PENDING  Incomplete  Culture, blood (routine x 2)     Status: None (Preliminary result)   Collection Time: 08/30/23 12:00 PM   Specimen: BLOOD  Result Value Ref Range Status   Specimen Description BLOOD LEFT AC  Final   Special Requests   Final    BOTTLES DRAWN AEROBIC AND ANAEROBIC Blood Culture adequate volume   Culture   Final    NO GROWTH 2 DAYS Performed at [facility], [address] [place], [place] [address]    Report Status PENDING  Incomplete  Respiratory (~20 pathogens) panel by PCR     Status: None   Collection Time: 08/30/23  3:06 PM   Specimen: Nasopharyngeal Swab; Respiratory  Result Value Ref Range Status   Adenovirus NOT DETECTED NOT DETECTED Final   Coronavirus 229E NOT DETECTED NOT DETECTED Final    Comment: (NOTE)  The Coronavirus on the Respiratory Panel, DOES NOT test for the novel  Coronavirus (2019 nCoV)    Coronavirus HKU1 NOT DETECTED NOT DETECTED Final   Coronavirus NL63 NOT DETECTED NOT DETECTED Final   Coronavirus OC43 NOT DETECTED NOT DETECTED Final   Metapneumovirus NOT DETECTED NOT DETECTED Final   Rhinovirus / Enterovirus NOT DETECTED NOT DETECTED Final    Influenza A NOT DETECTED NOT DETECTED Final   Influenza B NOT DETECTED NOT DETECTED Final   Parainfluenza Virus 1 NOT DETECTED NOT DETECTED Final   Parainfluenza Virus 2 NOT DETECTED NOT DETECTED Final   Parainfluenza Virus 3 NOT DETECTED NOT DETECTED Final   Parainfluenza Virus 4 NOT DETECTED NOT DETECTED Final   Respiratory Syncytial Virus NOT DETECTED NOT DETECTED Final   Bordetella pertussis NOT DETECTED NOT DETECTED Final   Bordetella Parapertussis NOT DETECTED NOT DETECTED Final   Chlamydophila pneumoniae NOT DETECTED NOT DETECTED Final   Mycoplasma pneumoniae NOT DETECTED NOT DETECTED Final    Comment: Performed at [facility], [address], [place], [place] [address]  MRSA Next Gen by PCR, Nasal     Status: None   Collection Time: 08/30/23  5:47 PM   Specimen: Nasal Mucosa; Nasal Swab  Result Value Ref Range Status   MRSA by PCR Next Gen NOT DETECTED NOT DETECTED Final    Comment: (NOTE) The GeneXpert MRSA Assay (FDA approved for NASAL specimens only), is one component of a comprehensive MRSA colonization surveillance program. It is not intended to diagnose MRSA infection nor to guide or monitor treatment for MRSA infections. Test performance is not FDA approved in patients less than 67 years old. Performed at [facility], [address] [place], [place] [address]     Anti-infectives:  Anti-infectives (From admission, onward)    Start     Dose/Rate Route Frequency Ordered Stop   08/30/23 1200  ceFEPIme  (MAXIPIME ) 2 g in sodium chloride  0.9 % 100 mL IVPB        2 g 200 mL/hr over 30 Minutes Intravenous  Once 08/30/23 1155 08/30/23 1243   08/30/23 1200  metroNIDAZOLE  (FLAGYL ) IVPB 500 mg        500 mg 100 mL/hr over 60 Minutes Intravenous  Once 08/30/23 1155 08/30/23 1400   08/30/23 1200  vancomycin  (VANCOCIN ) IVPB 1000 mg/200 mL premix        1,000 mg 200 mL/hr over 60 Minutes Intravenous  Once 08/30/23 1155 08/30/23 1502        PAST  MEDICAL HISTORY   Past Medical History:  Diagnosis Date   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HCC)    Adrenal insufficiency (HCC)    Anxiety    B12 deficiency    Cancer (HCC) 2018-2019   melanoma   Cervical radiculitis    Constipation    Dependent edema    Depression    Diabetes mellitus    PRE,NO MEDICATIONS AS OF 03/05/21   Dysmenorrhea    H/O: suicide attempt    History of antineoplastic chemotherapy    History of chemotherapy    Hyperlipidemia    Hypertension    Insomnia    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HCC)    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)    PMS (premenstrual syndrome)      SURGICAL HISTORY   Past Surgical History:  Procedure Laterality Date   BREAST BIOPSY Right    benign   CHOLECYSTECTOMY     COLONOSCOPY  11/08/2018   Danis-polyps   COLONOSCOPY  12/05/2019   Danis   DILATION AND CURETTAGE OF UTERUS     FOOT SURGERY Right 2018   removal of melanoma   LUMBAR FUSION     lymph node removal Right 2018   groin    POLYPECTOMY     TOTAL ABDOMINAL HYSTERECTOMY W/ BILATERAL SALPINGOOPHORECTOMY     TUBAL LIGATION       FAMILY HISTORY   Family History  Problem Relation Age of Onset   Depression Mother    Anxiety disorder Mother    Bipolar disorder Mother    Diabetes Mother    Mental illness Mother    Heart disease Mother    Stroke Mother    Lung cancer Father    Diabetes Maternal Grandmother    Heart attack Maternal Grandmother    CVA Paternal Grandmother    Colon cancer Neg Hx    Liver cancer Neg Hx    Pancreatic cancer Neg Hx    Esophageal cancer Neg Hx    Stomach cancer Neg Hx    Rectal cancer Neg Hx    Colon polyps Neg Hx    Breast cancer Neg Hx      SOCIAL HISTORY   Social History   Tobacco Use   Smoking status: Never    Passive exposure: Never   Smokeless tobacco: Never  Vaping Use   Vaping status: Never Used  Substance Use Topics   Alcohol use: Not Currently   Drug use: Never     MEDICATIONS   Current Medication:  Current  Facility-Administered Medications:    acetaminophen  (TYLENOL ) tablet 650 mg, 650 mg, Oral, Q6H PRN, Sibyl Mikula, MD, 650 mg at 08/31/23 0857   Chlorhexidine  Gluconate Cloth 2 % PADS 6 each, 6 each, Topical, Daily, Brennen Gardiner, MD, 6 each at 08/31/23 0841   dexamethasone  (DECADRON ) tablet 3 mg, 3 mg, Oral, Daily **FOLLOWED BY** [START ON 09/02/2023] dexamethasone  (DECADRON ) tablet 2 mg, 2 mg, Oral, Daily **FOLLOWED BY** [START ON 09/03/2023] dexamethasone  (DECADRON ) tablet 1 mg, 1 mg, Oral, Daily, Alyshia Kernan, MD   docusate sodium  (COLACE) capsule 100 mg, 100 mg, Oral, BID PRN, Tamakia Porto, MD   famotidine  (PEPCID ) tablet 20 mg, 20 mg, Oral, BID, Nazari, Walid A, RPH   heparin  injection 5,000 Units, 5,000 Units, Subcutaneous, Q8H, Varian Innes, MD, 5,000 Units at 09/01/23 0514   hydrocortisone  (CORTEF ) tablet 15 mg, 15 mg, Oral, Daily **AND** hydrocortisone  (CORTEF ) tablet 5 mg, 5 mg, Oral, Q1500, Caoilainn Sacks, MD   hydrocortisone  1 % lotion, , Topical, BID, Tiney Zipper, MD, Given at 08/31/23 2116   insulin  aspart (novoLOG ) injection 0-15 Units, 0-15 Units, Subcutaneous, Q4H, Rust-Chester, Britton L, NP, 3 Units at 09/01/23 0721   polyethylene glycol (MIRALAX  / GLYCOLAX ) packet 17 g, 17 g, Oral, Daily PRN, Kimorah Ridolfi, MD   pregabalin  (LYRICA ) capsule 150 mg, 150 mg, Oral, q morning, Nelson, Dana G, NP, 150 mg at 08/31/23 0856   pregabalin  (LYRICA ) capsule 300 mg, 300 mg, Oral, QPM, Nelson, Dana G, NP, 300 mg at 08/31/23 1709   rosuvastatin  (CRESTOR ) tablet 10 mg, 10 mg, Oral, Daily, Nelson, Dana G, NP, 10 mg at 08/31/23 0856    ALLERGIES   Clindamycin/lincomycin, Doxycycline , Aspirin , Ciprofloxacin, and Penicillins    REVIEW OF SYSTEMS     10 point ROS done and is negative except for pruritic rash otherwise as per subjective findings and h/p  PHYSICAL EXAMINATION   Vital Signs: Temp:  [98 F (36.7 C)-98.4 F (36.9 C)] 98 F (36.7 C) (  08/13 0400) Pulse  Rate:  [48-94] 82 (08/13 0700) Resp:  [15-29] 17 (08/13 0700) BP: (71-154)/(36-99) 113/49 (08/13 0700) SpO2:  [91 %-100 %] 92 % (08/13 0700) Weight:  [117.6 kg] 117.6 kg (08/13 0320)  GENERAL:NAD age appropriate , obese  HEAD: Normocephalic, atraumatic.  EYES: Pupils equal, round, reactive to light.  No scleral icterus.  MOUTH: Moist mucosal membrane. NECK: Supple. No thyromegaly. No nodules. No JVD.  PULMONARY: CTAB CARDIOVASCULAR: S1 and S2. Regular rate and rhythm. No murmurs, rubs, or gallops.  GASTROINTESTINAL: Soft, nontender, non-distended. No masses. Positive bowel sounds. No hepatosplenomegaly.  MUSCULOSKELETAL: No swelling, clubbing, or edema.  NEUROLOGIC: Mild distress due to acute illness SKIN: erythematous, raised papular rash with pruritus on neck, torso, exremeties   PERTINENT DATA     Infusions:   Scheduled Medications:  Chlorhexidine  Gluconate Cloth  6 each Topical Daily   dexamethasone   3 mg Oral Daily   Followed by   NOREEN ON 09/02/2023] dexamethasone   2 mg Oral Daily   Followed by   NOREEN ON 09/03/2023] dexamethasone   1 mg Oral Daily   famotidine   20 mg Oral BID   heparin   5,000 Units Subcutaneous Q8H   hydrocortisone   15 mg Oral Daily   And   hydrocortisone   5 mg Oral Q1500   hydrocortisone    Topical BID   insulin  aspart  0-15 Units Subcutaneous Q4H   pregabalin   150 mg Oral q morning   pregabalin   300 mg Oral QPM   rosuvastatin   10 mg Oral Daily   PRN Medications: acetaminophen , docusate sodium , polyethylene glycol Hemodynamic parameters:   Intake/Output: 08/12 0701 - 08/13 0700 In: 1145.9 [P.O.:480; I.V.:515.4; IV Piggyback:150.5] Out: 400 [Urine:400]  Ventilator  Settings:     LAB RESULTS:  Basic Metabolic Panel: Recent Labs  Lab 08/30/23 1147 08/31/23 0514 09/01/23 0350  NA 137 135 137  K 4.4 4.5 4.3  CL 101 105 104  CO2 22 20* 24  GLUCOSE 162* 244* 161*  BUN 29* 30* 27*  CREATININE 2.27* 1.65* 1.18*  CALCIUM  8.4* 8.0*  8.2*  MG  --  1.7 2.1  PHOS  --  3.1 2.5   Liver Function Tests: Recent Labs  Lab 08/30/23 1147  AST 22  ALT 20  ALKPHOS 59  BILITOT 1.2  PROT 6.1*  ALBUMIN 3.1*   Recent Labs  Lab 08/30/23 1506  LIPASE 22   No results for input(s): AMMONIA in the last 168 hours. CBC: Recent Labs  Lab 08/30/23 1147 08/31/23 0514 09/01/23 0350  WBC 16.5* 24.6* 25.7*  NEUTROABS 11.9*  --   --   HGB 12.7 12.2 10.9*  HCT 37.9 37.3 32.8*  MCV 88.8 89.9 87.2  PLT 243 203 219   Cardiac Enzymes: No results for input(s): CKTOTAL, CKMB, CKMBINDEX, TROPONINI in the last 168 hours. BNP: Invalid input(s): POCBNP CBG: Recent Labs  Lab 08/31/23 1623 08/31/23 1929 08/31/23 2311 09/01/23 0317 09/01/23 0714  GLUCAP 377* 319* 166* 169* 154*       IMAGING RESULTS:     ASSESSMENT AND PLAN    -Multidisciplinary rounds held today  Circulatory shock     Due to anaphylaxis from recent antibiotic therapy with clindamycin   -present on admission    - IVF   - IV steroids    - topical hydrocortisone  1% bid to affected areas    - Currently on low dose levophed  , I'm hoping we would not need central line and can wean off pressor support within few  hours or so. Currently on low dose peripherally -IV LR 50cc/hr  Exanthematous drug reaction   - patient does not have mucosal disruption and does not appear to have SJS/TEN  -currently with pruritus but no painful or blistering lesions  - s/p solucortef 100mg  x 1, now transitioned to lower dose decadron  due to DM  - continue topical 1% hydrocortisone  lotion  - continue pepcid  IV bid    - cotninue diphenhydramine  12.5 iV bid   - monitor for worsening of skin lesion ICU monitoring telemetry   Renal Failure- acute on chronic KDIGO 3 -presumably due to shock physiology with hypotension induced ischemia and renal impairment -follow chem 7 -follow UO -continue Foley Catheter-assess need daily   Secondary adrenal insufficiency     - apparently developed after immunotherapy nivolumab    - on florinef  currently po  ID -DC all antibiotics  -follow up cultures  GI/Nutrition GI PROPHYLAXIS as indicated DIET-->TF's as tolerated Constipation protocol as indicated  ENDO - ICU hypoglycemic\Hyperglycemia protocol -check FSBS per protocol   ELECTROLYTES -follow labs as needed -replace as needed -pharmacy consultation   DVT/GI PRX ordered -SCDs  TRANSFUSIONS AS NEEDED MONITOR FSBS ASSESS the need for LABS as needed    Critical care provider statement:   Total critical care time: 33 minutes   Performed by: Parris MD   Critical care time was exclusive of separately billable procedures and treating other patients.   Critical care was necessary to treat or prevent imminent or life-threatening deterioration.   Critical care was time spent personally by me on the following activities: development of treatment plan with patient and/or surrogate as well as nursing, discussions with consultants, evaluation of patient's response to treatment, examination of patient, obtaining history from patient or surrogate, ordering and performing treatments and interventions, ordering and review of laboratory studies, ordering and review of radiographic studies, pulse oximetry and re-evaluation of patient's condition.    Dandy Lazaro, M.D.  Pulmonary & Critical Care Medicine

## 2023-09-01 NOTE — TOC Progression Note (Signed)
 Transition of Care [facility]) - Progression Note    Patient Details  Name: Sarah [name] MRN: [id number] Date of Birth: 20-Aug-1950  [facility]) CM/SW Contact  Corrie JINNY Ruts, LCSW Phone Number: 09/01/2023, 11:42 AM  Clinical Narrative:    Chart Reviewed. I tried to called the patient daughter, [name] George to consult with her about the patient care. The patient has advanced dementia. TOC will follow up with the family at a later time.                      Expected Discharge Plan and Services                                               Social Drivers of Health (SDOH) Interventions SDOH Screenings   Food Insecurity: No Food Insecurity (08/30/2023)  Housing: Low Risk  (08/30/2023)  Transportation Needs: No Transportation Needs (08/30/2023)  Utilities: Not At Risk (08/30/2023)  Alcohol Screen: Low Risk  (09/29/2022)  Depression (PHQ2-9): Low Risk  (04/15/2023)  Recent Concern: Depression (PHQ2-9) - Medium Risk (04/07/2023)  Financial Resource Strain: Low Risk  (04/22/2023)   Received from [facility]  Physical Activity: Insufficiently Active (09/29/2022)  Social Connections: Moderately Isolated (08/30/2023)  Stress: No Stress Concern Present (09/29/2022)  Tobacco Use: Low Risk  (08/30/2023)  Health Literacy: Adequate Health Literacy (09/29/2022)    Readmission Risk Interventions     No data to display

## 2023-09-01 NOTE — Evaluation (Signed)
 Occupational Therapy Evaluation Patient Details Name: Sarah [name] MRN: [id number] DOB: 1950/08/27 Today's Date: 09/01/2023   History of Present Illness   73 yo F with hx of dyslipidemia, HTN, DM2, neuropathy, depression , prolonged QT syndrome,  history of malignant melanoma, obesity, vitamin D  deficiency who came in after noting a worsening rash all over her body.  Admitted for anaphylactic shock.     Clinical Impressions Pt was seen for OT evaluation this date. Pt received in the recliner, handoff from PT. Prior to hospital admission, pt was MODI in all ADL/IADL, amb with no DME. Pt lives in the independent living within [facility], she has a Programmer, applications who comes monthly and a daughter who can come visit as needed. Pt presents to acute care at her functional baseline. Pt completed simulated toilet transfer with MODI, good use of DME for UE support, pt educated on AD for LB dressing as pt wears compression socks to manage her lymphedema. All pt questions answered, she retired in her recliner with all needs in reach. Do not anticipate the need for follow up OT services upon acute hospital DC. OT will sign off.      If plan is discharge home, recommend the following:   Assistance with cooking/housework     Functional Status Assessment   Patient has not had a recent decline in their functional status     Equipment Recommendations   Other (comment) (Sock aid)     Recommendations for Other Services         Precautions/Restrictions   Precautions Precautions: Fall Restrictions Weight Bearing Restrictions Per Provider Order: No     Mobility Bed Mobility               General bed mobility comments: NT pt recieved in recliner, handoff from PT    Transfers Overall transfer level: Independent Equipment used: Rolling walker (2 wheels)               General transfer comment: Pt STS from recliner with no DME use, amb in room with RW with no LOB and good  safety awareness.      Balance Overall balance assessment: Needs assistance Sitting-balance support: No upper extremity supported Sitting balance-Leahy Scale: Normal Sitting balance - Comments: Steady reaching outside BOS during LB dressing tasks   Standing balance support: Bilateral upper extremity supported Standing balance-Leahy Scale: Good                             ADL either performed or assessed with clinical judgement   ADL Overall ADL's : Modified independent                                       General ADL Comments: Simulated toilet transfer with use of RW, MODI, doff socks and don with use of AD with great carryover of technique                                Pertinent Vitals/Pain Pain Assessment Pain Assessment: Faces Faces Pain Scale: Hurts a little bit Pain Location: rash Pain Descriptors / Indicators: Discomfort Pain Intervention(s): Monitored during session, Limited activity within patient's tolerance     Extremity/Trunk Assessment Upper Extremity Assessment Upper Extremity Assessment: Overall WFL for tasks assessed   Lower Extremity Assessment Lower  Extremity Assessment: Defer to PT evaluation   Cervical / Trunk Assessment Cervical / Trunk Assessment: Normal   Communication Communication Communication: No apparent difficulties   Cognition Arousal: Alert Behavior During Therapy: WFL for tasks assessed/performed               OT - Cognition Comments: A/Ox4                 Following commands: Intact       Cueing  General Comments   Cueing Techniques: Verbal cues  Noted redness in RLE, pt endorses lymphedema thoughout RLE, she manages with compression socks.   Exercises Exercises: Other exercises Other Exercises Other Exercises: Edu: Role of OT eval, safe ADL completion with use of AD, home modifications         Home Living Family/patient expects to be discharged to:: Private  residence Living Arrangements: Alone Available Help at Discharge: Available PRN/intermittently (house keeper monthly, daughter will stop by on occasion PRN) Type of Home:  ([facility]) Home Access: Level entry     Home Layout: One level     Bathroom Shower/Tub: Runner, broadcasting/film/video: Shower seat - built in;Grab bars - toilet;Grab bars - tub/shower;Hand held shower head          Prior Functioning/Environment Prior Level of Function : Independent/Modified Independent             Mobility Comments: reports driving and out in the community multiple times/wk, does her own shopping, medication management, errands, etc ADLs Comments: independent, apart from house keeper monthly                 OT Goals(Current goals can be found in the care plan section)   Acute Rehab OT Goals Patient Stated Goal: Return home OT Goal Formulation: With patient Time For Goal Achievement: 09/15/23 Potential to Achieve Goals: Good   AM-PAC OT 6 Clicks Daily Activity     Outcome Measure Help from another person eating meals?: None Help from another person taking care of personal grooming?: None Help from another person toileting, which includes using toliet, bedpan, or urinal?: None Help from another person bathing (including washing, rinsing, drying)?: None Help from another person to put on and taking off regular upper body clothing?: None Help from another person to put on and taking off regular lower body clothing?: None 6 Click Score: 24   End of Session Equipment Utilized During Treatment: Gait belt;Rolling walker (2 wheels) Nurse Communication: Mobility status  Activity Tolerance: Patient tolerated treatment well Patient left: in chair;with call bell/phone within reach                   Time: 1535-1546 OT Time Calculation (min): 11 min Charges:  OT General Charges $OT Visit: 1 Visit OT Evaluation $OT Eval Low Complexity: 1 Low  Larraine Colas  M.S. OTR/L  09/01/23, 4:20 PM

## 2023-09-01 NOTE — Telephone Encounter (Signed)
 Requested medication (s) are due for refill today: routing for review  Requested medication (s) are on the active medication list: {Yes Last refill:  08/06/23  Future visit scheduled: {Yes  Notes to clinic:  Pharmacy comment: Alternative Requested:Your patients prescription is off formulary. You may consider prescribing a covered alternative if appropriate or contact the plan to request an exception.      Requested Prescriptions  Pending Prescriptions Disp Refills   Blood Glucose Monitoring Suppl (ACCU-CHEK GUIDE) w/Device KIT [Pharmacy Med Name: ACCU-CHEK GUIDE MONITOR SYSTEM]  0     Endocrinology: Diabetes - Testing Supplies Passed - 09/01/2023 10:35 AM      Passed - Valid encounter within last 12 months    Recent Outpatient Visits           4 months ago Type 2 diabetes mellitus with obesity (HCC)   [place] [facility] [place], Ruby T, NP   4 months ago Chronic bilateral low back pain without sciatica   [place] [facility] Valerio Melanie DASEN, NP       Future Appointments             In 1 month [name], MD [facility]

## 2023-09-02 DIAGNOSIS — T782XXA Anaphylactic shock, unspecified, initial encounter: Secondary | ICD-10-CM | POA: Diagnosis not present

## 2023-09-02 LAB — BASIC METABOLIC PANEL WITH GFR
Anion gap: 10 (ref 5–15)
BUN: 28 mg/dL — ABNORMAL HIGH (ref 8–23)
CO2: 26 mmol/L (ref 22–32)
Calcium: 8.3 mg/dL — ABNORMAL LOW (ref 8.9–10.3)
Chloride: 105 mmol/L (ref 98–111)
Creatinine, Ser: 1 mg/dL (ref 0.44–1.00)
GFR, Estimated: 60 mL/min — ABNORMAL LOW (ref >60)
Glucose, Bld: 143 mg/dL — ABNORMAL HIGH (ref 70–99)
Potassium: 4.2 mmol/L (ref 3.5–5.1)
Sodium: 141 mmol/L (ref 135–145)

## 2023-09-02 LAB — CBC
HCT: 31 % — ABNORMAL LOW (ref 36.0–46.0)
Hemoglobin: 10.3 g/dL — ABNORMAL LOW (ref 12.0–15.0)
MCH: 29.2 pg (ref 26.0–34.0)
MCHC: 33.2 g/dL (ref 30.0–36.0)
MCV: 87.8 fL (ref 80.0–100.0)
Platelets: 220 K/uL (ref 150–400)
RBC: 3.53 MIL/uL — ABNORMAL LOW (ref 3.87–5.11)
RDW: 13.6 % (ref 11.5–15.5)
WBC: 25.8 K/uL — ABNORMAL HIGH (ref 4.0–10.5)
nRBC: 0.2 % (ref 0.0–0.2)

## 2023-09-02 LAB — GLUCOSE, CAPILLARY
Glucose-Capillary: 130 mg/dL — ABNORMAL HIGH (ref 70–99)
Glucose-Capillary: 133 mg/dL — ABNORMAL HIGH (ref 70–99)
Glucose-Capillary: 163 mg/dL — ABNORMAL HIGH (ref 70–99)
Glucose-Capillary: 170 mg/dL — ABNORMAL HIGH (ref 70–99)
Glucose-Capillary: 192 mg/dL — ABNORMAL HIGH (ref 70–99)
Glucose-Capillary: 228 mg/dL — ABNORMAL HIGH (ref 70–99)

## 2023-09-02 LAB — MAGNESIUM: Magnesium: 2.2 mg/dL (ref 1.7–2.4)

## 2023-09-02 LAB — PHOSPHORUS: Phosphorus: 2.3 mg/dL — ABNORMAL LOW (ref 2.5–4.6)

## 2023-09-02 MED ORDER — SUVOREXANT 20 MG PO TABS: 20.0000 mg | ORAL_TABLET | Freq: Every evening | ORAL | Status: DC | PRN

## 2023-09-02 MED ORDER — ESCITALOPRAM OXALATE 10 MG PO TABS
10.0000 mg | ORAL_TABLET | Freq: Every day | ORAL | Status: DC
  Administered 2023-09-02 – 2023-09-03 (×2): 10 mg via ORAL
  Filled 2023-09-02 (×2): qty 1

## 2023-09-02 MED ORDER — INSULIN ASPART 100 UNIT/ML IJ SOLN
0.0000 [IU] | Freq: Every day | INTRAMUSCULAR | Status: DC
  Filled 2023-09-02: qty 1

## 2023-09-02 MED ORDER — INSULIN ASPART 100 UNIT/ML IJ SOLN
0.0000 [IU] | Freq: Three times a day (TID) | INTRAMUSCULAR | Status: DC
  Administered 2023-09-02: 7 [IU] via SUBCUTANEOUS
  Administered 2023-09-03: 4 [IU] via SUBCUTANEOUS
  Filled 2023-09-02 (×3): qty 1

## 2023-09-02 MED ORDER — AMITRIPTYLINE HCL 25 MG PO TABS
50.0000 mg | ORAL_TABLET | Freq: Every day | ORAL | Status: DC
  Administered 2023-09-02: 50 mg via ORAL
  Filled 2023-09-02: qty 2

## 2023-09-02 NOTE — Progress Notes (Signed)
 CRITICAL CARE    Name: Sarah [name] MRN: [id number] DOB: [birth year]     LOS: 3   SUBJECTIVE FINDINGS & SIGNIFICANT EVENTS    History of Presenting Illness:   This is a 73 yo F with hx of dyslipidemia, HTN, DM2, neuropathy, depression , prolonged QT syndrome,  history of malignant melanoma, obesity, vitamin D  deficiency who came in after noting a worsening rash all over her body.  She noted erythematous papular prutic rash forming appx 24h after initiation of antimicrobial therapy.  She did have dental procedure performed recently.  She was prescribed clindamycin for this purpose and had developed rash after starting this therapy.  She recalls that when she had a previous infection of foot where she had a melanomatous lesion at the time she was also prescribed clindamycin and subsequently developed a rash quickly spread and required 1 week hospitalization at [facility].  She endorses pruritus on rash involved areas.  Her lactate was elevated on arrival and it was noted she was in shock physiology.  Despite receiving almost 3L of IVF she continued to be hypotensive and required IV levophed  for vasopressor support to keep BP MAP > 65.  She is able to speak, is AAOx4 and is accompanied by her daughter.  PCCM admission requriested due to anaphylactic shock requiring pressor support despite IVF.    08/31/23- patient with no overnight events, she's off vasopressors and is lucid. Renal failure is improved markedly.  Optimizing for TRH.  Tapering decadron  today.  09/01/23- patient seen at bedside, she reports improvement in skin erythema, pruritus, and rash size overall.  She had swallow study and showed no issues swallowing related to any swelling instead findings consistent with GERD was noted and recommendation for outpatient  GERD eval.  Were currently treating her with pepcid  bid and she has no reflux esophagitis clinically.  She is being optimized for TRH transfer.   8/14 Breathing stable, rash persists  Lines/tubes :   Microbiology/Sepsis markers: Results for orders placed or performed during the hospital encounter of 08/30/23  Culture, blood (routine x 2)     Status: None (Preliminary result)   Collection Time: 08/30/23 11:48 AM   Specimen: BLOOD  Result Value Ref Range Status   Specimen Description BLOOD BLOOD RIGHT FOREARM  Final   Special Requests   Final    BOTTLES DRAWN AEROBIC AND ANAEROBIC Blood Culture results may not be optimal due to an inadequate volume of blood received in culture bottles   Culture   Final    NO GROWTH 3 DAYS Performed at [facility], [address], [place], [place] [address]    Report Status PENDING  Incomplete  Culture, blood (routine x 2)     Status: None (Preliminary result)   Collection Time: 08/30/23 12:00 PM   Specimen: BLOOD  Result Value Ref Range Status   Specimen Description BLOOD LEFT AC  Final   Special Requests   Final    BOTTLES DRAWN AEROBIC AND ANAEROBIC Blood Culture adequate volume   Culture   Final    NO GROWTH 3 DAYS Performed at [facility], [address], [place], [place] [address]    Report Status PENDING  Incomplete  Respiratory (~20 pathogens) panel by PCR     Status: None   Collection Time: 08/30/23  3:06 PM   Specimen: Nasopharyngeal Swab; Respiratory  Result Value Ref Range Status   Adenovirus NOT DETECTED NOT DETECTED Final   Coronavirus 229E NOT DETECTED NOT DETECTED  Final    Comment: (NOTE) The Coronavirus on the Respiratory Panel, DOES NOT test for the novel  Coronavirus (2019 nCoV)    Coronavirus HKU1 NOT DETECTED NOT DETECTED Final   Coronavirus NL63 NOT DETECTED NOT DETECTED Final   Coronavirus OC43 NOT DETECTED NOT DETECTED Final   Metapneumovirus NOT DETECTED NOT DETECTED Final   Rhinovirus /  Enterovirus NOT DETECTED NOT DETECTED Final   Influenza A NOT DETECTED NOT DETECTED Final   Influenza B NOT DETECTED NOT DETECTED Final   Parainfluenza Virus 1 NOT DETECTED NOT DETECTED Final   Parainfluenza Virus 2 NOT DETECTED NOT DETECTED Final   Parainfluenza Virus 3 NOT DETECTED NOT DETECTED Final   Parainfluenza Virus 4 NOT DETECTED NOT DETECTED Final   Respiratory Syncytial Virus NOT DETECTED NOT DETECTED Final   Bordetella pertussis NOT DETECTED NOT DETECTED Final   Bordetella Parapertussis NOT DETECTED NOT DETECTED Final   Chlamydophila pneumoniae NOT DETECTED NOT DETECTED Final   Mycoplasma pneumoniae NOT DETECTED NOT DETECTED Final    Comment: Performed at [facility], [address] [place], [place] [address]  MRSA Next Gen by PCR, Nasal     Status: None   Collection Time: 08/30/23  5:47 PM   Specimen: Nasal Mucosa; Nasal Swab  Result Value Ref Range Status   MRSA by PCR Next Gen NOT DETECTED NOT DETECTED Final    Comment: (NOTE) The GeneXpert MRSA Assay (FDA approved for NASAL specimens only), is one component of a comprehensive MRSA colonization surveillance program. It is not intended to diagnose MRSA infection nor to guide or monitor treatment for MRSA infections. Test performance is not FDA approved in patients less than 90 years old. Performed at [facility], [address], [place], [place] [address]     Anti-infectives:  Anti-infectives (From admission, onward)    Start     Dose/Rate Route Frequency Ordered Stop   08/30/23 1200  ceFEPIme  (MAXIPIME ) 2 g in sodium chloride  0.9 % 100 mL IVPB        2 g 200 mL/hr over 30 Minutes Intravenous  Once 08/30/23 1155 08/30/23 1243   08/30/23 1200  metroNIDAZOLE  (FLAGYL ) IVPB 500 mg        500 mg 100 mL/hr over 60 Minutes Intravenous  Once 08/30/23 1155 08/30/23 1400   08/30/23 1200  vancomycin  (VANCOCIN ) IVPB 1000 mg/200 mL premix        1,000 mg 200 mL/hr over 60 Minutes Intravenous  Once 08/30/23  1155 08/30/23 1502        PAST MEDICAL HISTORY   Past Medical History:  Diagnosis Date   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HCC)    Adrenal insufficiency (HCC)    Anxiety    B12 deficiency    Cancer (HCC) 2018-2019   melanoma   Cervical radiculitis    Constipation    Dependent edema    Depression    Diabetes mellitus    PRE,NO MEDICATIONS AS OF 03/05/21   Dysmenorrhea    H/O: suicide attempt    History of antineoplastic chemotherapy    History of chemotherapy    Hyperlipidemia    Hypertension    Insomnia    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HCC)    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)    PMS (premenstrual syndrome)      SURGICAL HISTORY   Past Surgical History:  Procedure Laterality Date   BREAST BIOPSY Right    benign   CHOLECYSTECTOMY     COLONOSCOPY  11/08/2018  Danis-polyps   COLONOSCOPY  12/05/2019   Danis   DILATION AND CURETTAGE OF UTERUS     FOOT SURGERY Right 2018   removal of melanoma   LUMBAR FUSION     lymph node removal Right 2018   groin    POLYPECTOMY     TOTAL ABDOMINAL HYSTERECTOMY W/ BILATERAL SALPINGOOPHORECTOMY     TUBAL LIGATION       FAMILY HISTORY   Family History  Problem Relation Age of Onset   Depression Mother    Anxiety disorder Mother    Bipolar disorder Mother    Diabetes Mother    Mental illness Mother    Heart disease Mother    Stroke Mother    Lung cancer Father    Diabetes Maternal Grandmother    Heart attack Maternal Grandmother    CVA Paternal Grandmother    Colon cancer Neg Hx    Liver cancer Neg Hx    Pancreatic cancer Neg Hx    Esophageal cancer Neg Hx    Stomach cancer Neg Hx    Rectal cancer Neg Hx    Colon polyps Neg Hx    Breast cancer Neg Hx      SOCIAL HISTORY   Social History   Tobacco Use   Smoking status: Never    Passive exposure: Never   Smokeless tobacco: Never  Vaping Use   Vaping status: Never Used  Substance Use Topics   Alcohol use: Not Currently   Drug use: Never      MEDICATIONS   Current Medication:  Current Facility-Administered Medications:    acetaminophen  (TYLENOL ) tablet 650 mg, 650 mg, Oral, Q6H PRN, Aleskerov, Fuad, MD, 650 mg at 09/01/23 2236   [COMPLETED] dexamethasone  (DECADRON ) tablet 3 mg, 3 mg, Oral, Daily, 3 mg at 09/01/23 0957 **FOLLOWED BY** [COMPLETED] dexamethasone  (DECADRON ) tablet 2 mg, 2 mg, Oral, Daily, 2 mg at 09/02/23 1057 **FOLLOWED BY** [START ON 09/03/2023] dexamethasone  (DECADRON ) tablet 1 mg, 1 mg, Oral, Daily, Aleskerov, Fuad, MD   docusate sodium  (COLACE) capsule 100 mg, 100 mg, Oral, BID PRN, Aleskerov, Fuad, MD   enoxaparin  (LOVENOX ) injection 60 mg, 60 mg, Subcutaneous, Q24H, Jordan, Ayesha Markwell, Student-PharmD, 60 mg at 09/01/23 2130   famotidine  (PEPCID ) tablet 20 mg, 20 mg, Oral, BID, Nazari, Walid A, RPH, 20 mg at 09/02/23 1057   hydrocortisone  (CORTEF ) tablet 15 mg, 15 mg, Oral, Daily, 15 mg at 09/02/23 0842 **AND** hydrocortisone  (CORTEF ) tablet 5 mg, 5 mg, Oral, Q1500, Aleskerov, Fuad, MD, 5 mg at 09/01/23 1422   hydrocortisone  1 % lotion, , Topical, BID, Aleskerov, Fuad, MD, Given at 09/02/23 1230   insulin  aspart (novoLOG ) injection 0-15 Units, 0-15 Units, Subcutaneous, Q4H, Rust-Chester, Britton L, NP, 3 Units at 09/02/23 1337   magic mouthwash, 5 mL, Oral, TID PRN, 5 mL at 09/02/23 1057 **AND** [DISCONTINUED] lidocaine  (XYLOCAINE ) 2 % viscous mouth solution 5 mL, 5 mL, Mouth/Throat, Q3H PRN, Rust-Chester, Britton L, NP   polyethylene glycol (MIRALAX  / GLYCOLAX ) packet 17 g, 17 g, Oral, Daily PRN, Aleskerov, Fuad, MD   pregabalin  (LYRICA ) capsule 150 mg, 150 mg, Oral, q morning, Nelson, Dana G, NP, 150 mg at 09/02/23 1056   pregabalin  (LYRICA ) capsule 300 mg, 300 mg, Oral, QPM, Nelson, Dana G, NP, 300 mg at 09/01/23 1757   rosuvastatin  (CRESTOR ) tablet 10 mg, 10 mg, Oral, Daily, Nelson, Dana G, NP, 10 mg at 09/02/23 1057    ALLERGIES   Clindamycin/lincomycin, Doxycycline , Aspirin , Ciprofloxacin, and  Penicillins    REVIEW OF  SYSTEMS     10 point ROS done and is negative except for pruritic rash otherwise as per subjective findings and h/p  PHYSICAL EXAMINATION   Vital Signs: Temp:  [97.9 F (36.6 C)-98.4 F (36.9 C)] 97.9 F (36.6 C) (08/14 0823) Pulse Rate:  [74-82] 74 (08/14 0823) Resp:  [14-16] 16 (08/14 0823) BP: (109-134)/(53-87) 134/58 (08/14 0823) SpO2:  [97 %-98 %] 98 % (08/14 0823) Weight:  [115.8 kg] 115.8 kg (08/14 0500)  GENERAL:NAD age appropriate , obese  HEAD: Normocephalic, atraumatic.  EYES: Pupils equal, round, reactive to light.  No scleral icterus.  MOUTH: Moist mucosal membrane. NECK: Supple. No thyromegaly. No nodules. No JVD.  PULMONARY: CTAB CARDIOVASCULAR: S1 and S2. Regular rate and rhythm. No murmurs, rubs, or gallops.  GASTROINTESTINAL: Soft, nontender, non-distended. No masses. Positive bowel sounds. No hepatosplenomegaly.  MUSCULOSKELETAL: No swelling, clubbing, or edema.  NEUROLOGIC: Mild distress due to acute illness SKIN: maculopapular rash head to toes, no mm involvement, blanches   PERTINENT DATA     Infusions:   Scheduled Medications:  [START ON 09/03/2023] dexamethasone   1 mg Oral Daily   enoxaparin  (LOVENOX ) injection  60 mg Subcutaneous Q24H   famotidine   20 mg Oral BID   hydrocortisone   15 mg Oral Daily   And   hydrocortisone   5 mg Oral Q1500   hydrocortisone    Topical BID   insulin  aspart  0-15 Units Subcutaneous Q4H   pregabalin   150 mg Oral q morning   pregabalin   300 mg Oral QPM   rosuvastatin   10 mg Oral Daily   PRN Medications: acetaminophen , docusate sodium , magic mouthwash **AND** [DISCONTINUED] lidocaine , polyethylene glycol Hemodynamic parameters:   Intake/Output: 08/13 0701 - 08/14 0700 In: 362.5 [P.O.:360; I.V.:2.5] Out: -   Ventilator  Settings:     LAB RESULTS:  Basic Metabolic Panel: Recent Labs  Lab 08/30/23 1147 08/31/23 0514 09/01/23 0350 09/02/23 0316  NA 137 135 137 141  K 4.4  4.5 4.3 4.2  CL 101 105 104 105  CO2 22 20* 24 26  GLUCOSE 162* 244* 161* 143*  BUN 29* 30* 27* 28*  CREATININE 2.27* 1.65* 1.18* 1.00  CALCIUM  8.4* 8.0* 8.2* 8.3*  MG  --  1.7 2.1 2.2  PHOS  --  3.1 2.5 2.3*   Liver Function Tests: Recent Labs  Lab 08/30/23 1147  AST 22  ALT 20  ALKPHOS 59  BILITOT 1.2  PROT 6.1*  ALBUMIN 3.1*   Recent Labs  Lab 08/30/23 1506  LIPASE 22   No results for input(s): AMMONIA in the last 168 hours. CBC: Recent Labs  Lab 08/30/23 1147 08/31/23 0514 09/01/23 0350 09/02/23 0316  WBC 16.5* 24.6* 25.7* 25.8*  NEUTROABS 11.9*  --   --   --   HGB 12.7 12.2 10.9* 10.3*  HCT 37.9 37.3 32.8* 31.0*  MCV 88.8 89.9 87.2 87.8  PLT 243 203 219 220   Cardiac Enzymes: No results for input(s): CKTOTAL, CKMB, CKMBINDEX, TROPONINI in the last 168 hours. BNP: Invalid input(s): POCBNP CBG: Recent Labs  Lab 09/01/23 1957 09/02/23 0024 09/02/23 0409 09/02/23 0820 09/02/23 1158  GLUCAP 280* 170* 130* 133* 163*       IMAGING RESULTS:     ASSESSMENT AND PLAN    -Multidisciplinary rounds held today  Circulatory shock     Due to anaphylaxis from recent antibiotic therapy with clindamycin   -present on admission    - s/p pressors and ivf   - tapering steroids   -  epi pen at d/c  Exanthematous drug reaction   - patient does not have mucosal disruption and does not appear to have SJS/TEN  -currently with pruritus but no painful or blistering lesions  - weaning steroids - advised will take a week or more to resolve.   Renal Failure- acute on chronic KDIGO 3 -presumably due to shock physiology with hypotension induced ischemia and renal impairment - now resolved  Secondary adrenal insufficiency    - apparently developed after immunotherapy nivolumab    - on cortef  currently po, with additional steroids as above   ENDO - ssi, has steroid-induced hyperglycemia on t2dm

## 2023-09-02 NOTE — Plan of Care (Signed)
  Problem: Clinical Measurements: Goal: Diagnostic test results will improve Outcome: Progressing   Problem: Nutrition: Goal: Adequate nutrition will be maintained Outcome: Progressing   Problem: Pain Managment: Goal: General experience of comfort will improve and/or be controlled Outcome: Progressing   Problem: Metabolic: Goal: Ability to maintain appropriate glucose levels will improve Outcome: Progressing

## 2023-09-02 NOTE — Inpatient Diabetes Management (Signed)
 Inpatient Diabetes Program Recommendations  AACE/ADA: New Consensus Statement on Inpatient Glycemic Control  Target Ranges:  Prepandial:   less than 140 mg/dL      Peak postprandial:   less than 180 mg/dL (1-2 hours)      Critically ill patients:  140 - 180 mg/dL    Latest Reference Range & Units 09/01/23 07:14 09/01/23 11:26 09/01/23 17:40 09/01/23 19:57 09/02/23 00:24 09/02/23 04:09  Glucose-Capillary 70 - 99 mg/dL 845 (H) 845 (H) 736 (H) 280 (H) 170 (H) 130 (H)   Review of Glycemic Control  Diabetes history: DM2 Outpatient Diabetes medications: Metformin  1000 mg BID Current orders for Inpatient glycemic control: Novolog  0-9 units TID with meals, Novolog  0-5 units at bedtime; Decadron  taper, Cortef  15 mg daily   Inpatient Diabetes Program Recommendations:     Insulin : If steroids are continued as ordered, please consider ordering Semglee 5 units Q24H and Novolog  2 units TID with meals for meal coverage if patient eats at least 50% of meals.   Diet: May want to consider discontinuing Regular diet and ordering Carb Modified diet   Thanks, Earnie Gainer, RN, MSN, CDCES Diabetes Coordinator Inpatient Diabetes Program [phone] (Team Pager from 8am to 5pm)

## 2023-09-02 NOTE — Progress Notes (Signed)
 Mobility Specialist - Progress Note     09/02/23 1434  Mobility  Activity Stood at bedside;Ambulated with assistance  Level of Assistance Standby assist, set-up cues, supervision of patient - no hands on  Assistive Device Front wheel walker  Distance Ambulated (ft) 280 ft  Range of Motion/Exercises Active  Activity Response Tolerated well  Mobility Referral Yes  Mobility visit 1 Mobility  Mobility Specialist Start Time (ACUTE ONLY) 1128  Mobility Specialist Stop Time (ACUTE ONLY) 1145  Mobility Specialist Time Calculation (min) (ACUTE ONLY) 17 min   Pt resting in bed on RA upon entry. Pt STS and ambulates to hallway around B pod with RW x2 laps. Pt returned to bed and left with needs in reach. Bed alarm activated.   Guido Rumble Mobility Specialist 09/02/23, 2:53 PM

## 2023-09-03 ENCOUNTER — Other Ambulatory Visit: Payer: Self-pay

## 2023-09-03 DIAGNOSIS — T782XXA Anaphylactic shock, unspecified, initial encounter: Secondary | ICD-10-CM | POA: Diagnosis not present

## 2023-09-03 LAB — GLUCOSE, CAPILLARY: Glucose-Capillary: 159 mg/dL — ABNORMAL HIGH (ref 70–99)

## 2023-09-03 MED ORDER — EPINEPHRINE 0.3 MG/0.3ML IJ SOAJ
0.3000 mg | INTRAMUSCULAR | 0 refills | Status: AC | PRN
  Filled 2023-09-03: qty 2, 30d supply, fill #0

## 2023-09-03 MED ORDER — ALBUTEROL SULFATE (2.5 MG/3ML) 0.083% IN NEBU
2.5000 mg | INHALATION_SOLUTION | RESPIRATORY_TRACT | Status: DC | PRN
  Administered 2023-09-03 (×2): 2.5 mg via RESPIRATORY_TRACT
  Filled 2023-09-03: qty 3

## 2023-09-03 NOTE — TOC Transition Note (Signed)
 Transition of Care [facility]) - Discharge Note   Patient Details  Name: Sarah [name] MRN: [id number] Date of Birth: 18-Dec-1950  [facility] CM/SW Contact:  Alvaro Louder, LCSW Phone Number: 09/03/2023, 10:54 AM   Clinical Narrative:  LCSWA met with patient at the bedside to discuss DME recommendation. LCSWA reported to patient and sister that DME RW has been ordered for her and will be delivered to her home at [place]. Patient was agreeable. Patient stated that her daughter will take her home at Discharge.   TOC signing off    Final next level of care: Home/Self Care Barriers to Discharge: No Barriers Identified   Patient Goals and CMS Choice            Discharge Placement              Patient chooses bed at:  (Home) Patient to be transferred to facility by: [name] Name of family member notified: Self Patient and family notified of of transfer: 09/03/23  Discharge Plan and Services Additional resources added to the After Visit Summary for                  DME Arranged: Walker rolling DME Agency: [employer] Date DME Agency Contacted: 09/03/23   Representative spoke with at DME Agency: Thomasina            Social Drivers of Health (SDOH) Interventions SDOH Screenings   Food Insecurity: No Food Insecurity (08/30/2023)  Housing: [name] Risk  (08/30/2023)  Transportation Needs: No Transportation Needs (08/30/2023)  Utilities: Not At Risk (08/30/2023)  Alcohol Screen: [name] Risk  (09/29/2022)  Depression (PHQ2-9): [name] Risk  (04/15/2023)  Recent Concern: Depression (PHQ2-9) - Medium Risk (04/07/2023)  Financial Resource Strain: [name] Risk  (04/22/2023)   Received from [facility]  Physical Activity: Insufficiently Active (09/29/2022)  Social Connections: Moderately Isolated (08/30/2023)  Stress: No Stress Concern Present (09/29/2022)  Tobacco Use: [name] Risk  (08/30/2023)  Health Literacy: Adequate Health Literacy (09/29/2022)     Readmission Risk  Interventions     No data to display

## 2023-09-03 NOTE — Plan of Care (Signed)
   Problem: Clinical Measurements: Goal: Diagnostic test results will improve Outcome: Progressing   Problem: Activity: Goal: Risk for activity intolerance will decrease Outcome: Progressing   Problem: Coping: Goal: Level of anxiety will decrease Outcome: Progressing   Problem: Safety: Goal: Ability to remain free from injury will improve Outcome: Progressing

## 2023-09-03 NOTE — Discharge Summary (Signed)
 Sarah [name] FMW:[id number] DOB: May 11, 1950 DOA: 08/30/2023  PCP: Valerio Melanie DASEN, NP  Admit date: 08/30/2023 Discharge date: 09/03/2023  Time spent: 35 minutes  Recommendations for Outpatient Follow-up:  Pcp f/u      Discharge Diagnoses:  Principal Problem:   Anaphylactic shock   Discharge Condition: improved  Diet recommendation: heart healthy  Filed Weights   09/01/23 0320 09/02/23 0500 09/03/23 0445  Weight: 117.6 kg 115.8 kg 116.7 kg    History of present illness:  From admission h and p This is a 73 yo F with hx of dyslipidemia, HTN, DM2, neuropathy, depression , prolonged QT syndrome, history of malignant melanoma, obesity, vitamin D  deficiency who came in after noting a worsening rash all over her body. She noted erythematous papular prutic rash forming appx 24h after initiation of antimicrobial therapy. She did have dental procedure performed recently. She was prescribed clindamycin for this purpose and had developed rash after starting this therapy. She recalls that when she had a previous infection of foot where she had a melanomatous lesion at the time she was also prescribed clindamycin and subsequently developed a rash quickly spread and required 1 week hospitalization at [facility]. She endorses pruritus on rash involved areas. Her lactate was elevated on arrival and it was noted she was in shock physiology. Despite receiving almost 3L of IVF she continued to be hypotensive and required IV levophed  for vasopressor support to keep BP MAP > 65. She is able to speak, is AAOx4 and is accompanied by her daughter. PCCM admission requriested due to anaphylactic shock requiring pressor support despite IVF.   Hospital Course:   Patient presents with rash, found to be hypotensive, this in setting of recent clindamycin, admitted to ICU for anaphylactic shock, treated with pressors and corticosteroids. Rash persists (not unexpected) but hypotension resolved, no airway  compromise. Monitored an additional night 8/14-8/15 without return of symptoms. Discharged home with plan for pcp f/u, also prescribed epi-pen for as needed use.   Procedures: none   Consultations: none  Discharge Exam: Vitals:   09/03/23 0600 09/03/23 0804  BP:  (!) 146/55  Pulse:  95  Resp:  16  Temp:  99.3 F (37.4 C)  SpO2: 96% 91%    General: NAD Cardiovascular: RRR Respiratory: CTAB Skin: erythematous maculopapular blanching rash throughout  Discharge Instructions   Discharge Instructions     Diet - low sodium heart healthy   Complete by: As directed    Increase activity slowly   Complete by: As directed       Allergies as of 09/03/2023       Reactions   Clindamycin/lincomycin Anaphylaxis   Doxycycline  Rash   Aspirin  Other (See Comments)   Pt is unsure of reaction, but states that she is allergic to aspirin    Ciprofloxacin Rash   very bad rash   Penicillins Rash   Has patient had a PCN reaction causing immediate rash, facial/tongue/throat swelling, SOB or lightheadedness with hypotension: No Has patient had a PCN reaction causing severe rash involving mucus membranes or skin necrosis: No Has patient had a PCN reaction that required hospitalization: No Has patient had a PCN reaction occurring within the last 10 years: No If all of the above answers are NO, then may proceed with Cephalosporin use.        Medication List     STOP taking these medications    OneTouch Verio test strip Generic drug: glucose blood Replaced by: Accu-Chek Guide w/Device Kit  TAKE these medications    amitriptyline  50 MG tablet Commonly known as: ELAVIL  Take 1 tablet (50 mg total) by mouth at bedtime.   amLODipine  2.5 MG tablet Commonly known as: NORVASC  Take 1 tablet (2.5 mg total) by mouth daily.   Baclofen  5 MG Tabs TAKE 1 TABLET BY MOUTH EVERY 8 HOURS AS NEEDED   Belsomra  20 MG Tabs Generic drug: Suvorexant  Take 1 tablet (20 mg total) by mouth  at bedtime.   EPINEPHrine  0.3 mg/0.3 mL Soaj injection Commonly known as: EpiPen  2-Pak Inject 0.3 mg into the muscle as needed for anaphylaxis.   escitalopram  10 MG tablet Commonly known as: LEXAPRO  Take 1 tablet (10 mg total) by mouth daily.   FIBER ADULT GUMMIES PO Take 1 tablet by mouth daily.   furosemide  40 MG tablet Commonly known as: LASIX  TAKE 1 TABLET BY MOUTH EVERY DAY   HYDROcodone -acetaminophen  5-325 MG tablet Commonly known as: NORCO/VICODIN Take 1 tablet by mouth every 6 (six) hours as needed for moderate pain (pain score 4-6).   hydrocortisone  5 MG tablet Commonly known as: CORTEF  TAKE THREE TABLETS IN THE MORNING AND ONE TABLET IN THE AFTERNOON   lidocaine  5 % Commonly known as: LIDODERM  PLACE 1 PATCH ONTO THE SKIN DAILY. REMOVE & DISCARD PATCH WITHIN 12 HOURS OR AS DIRECTED BY MD   losartan  50 MG tablet Commonly known as: COZAAR  Take 1 tablet (50 mg total) by mouth daily.   meclizine  25 MG tablet Commonly known as: ANTIVERT  Take 1 tablet (25 mg total) by mouth 3 (three) times daily as needed for dizziness.   metFORMIN  1000 MG tablet Commonly known as: GLUCOPHAGE  Take 1 tablet (1,000 mg total) by mouth 2 (two) times daily with a meal.   OneTouch UltraSoft 2 Lancets Misc USE TO CHECK BLOOD SUGAR 3 TIMES A DAY AND DOCUMENT RESULTS, BRING TO APPOINTMENTS. GOAL IS <130 FASTING BLOOD SUGAR AND <180 TWO HOURS AFTER MEALS.   OneTouch Verio w/Device Kit Use to check blood sugar 3 times a day and document results, bring to appointments.  Goal is <130 fasting blood sugar and <180 two hours after meals. What changed: Another medication with the same name was added. Make sure you understand how and when to take each.   Accu-Chek Guide w/Device Kit To check blood sugar 2-3 times daily and document for visits. What changed: You were already taking a medication with the same name, and this prescription was added. Make sure you understand how and when to take  each. Replaces: OneTouch Verio test strip   pregabalin  150 MG capsule Commonly known as: LYRICA  150 mg in am, 150 MG in afternoon, and 300 mg in pm   rosuvastatin  10 MG tablet Commonly known as: CRESTOR  Take 1 tablet (10 mg total) by mouth daily.   VITAMIN B-12 PO Take by mouth daily.   Vitamin D  50 MCG (2000 UT) Caps Take by mouth.               Durable Medical Equipment  (From admission, onward)           Start     Ordered   09/02/23 1438  For home use only DME Walker rolling  Once       Question Answer Comment  Walker: With 5 Inch Wheels   Patient needs a walker to treat with the following condition Anaphylaxis      09/02/23 1437           Allergies  Allergen Reactions  Clindamycin/Lincomycin Anaphylaxis   Doxycycline  Rash   Aspirin  Other (See Comments)    Pt is unsure of reaction, but states that she is allergic to aspirin    Ciprofloxacin Rash    very bad rash   Penicillins Rash    Has patient had a PCN reaction causing immediate rash, facial/tongue/throat swelling, SOB or lightheadedness with hypotension: No Has patient had a PCN reaction causing severe rash involving mucus membranes or skin necrosis: No Has patient had a PCN reaction that required hospitalization: No Has patient had a PCN reaction occurring within the last 10 years: No If all of the above answers are NO, then may proceed with Cephalosporin use.     Follow-up Information     Valerio Melanie DASEN, NP Follow up.   Specialty: Nurse Practitioner Why: as scheduled Contact information: [address] [place] [address] [phone]                  The results of significant diagnostics from this hospitalization (including imaging, microbiology, ancillary and laboratory) are listed below for reference.    Significant Diagnostic Studies: CT CHEST ABDOMEN PELVIS WO CONTRAST Result Date: 08/30/2023 CLINICAL DATA:  Rash, shortness of breath, hypotension. EXAM: CT CHEST,  ABDOMEN AND PELVIS WITHOUT CONTRAST TECHNIQUE: Multidetector CT imaging of the chest, abdomen and pelvis was performed following the standard protocol without IV contrast. RADIATION DOSE REDUCTION: This exam was performed according to the departmental dose-optimization program which includes automated exposure control, adjustment of the mA and/or kV according to patient size and/or use of iterative reconstruction technique. COMPARISON:  CT abdomen pelvis 09/04/2017. FINDINGS: CT CHEST FINDINGS Cardiovascular: Atherosclerotic calcification of the aorta, aortic valve and coronary arteries. Heart is enlarged. No pericardial effusion. Mediastinum/Nodes: No pathologically enlarged mediastinal or axillary lymph nodes. Hilar regions are difficult to definitively evaluate without IV contrast. Multiple subpectoral lymph nodes are not considered enlarged by CT size criteria but are noted. Esophagus is grossly unremarkable. Lungs/Pleura: Image quality is degraded by expiratory phase imaging and respiratory motion. 2 mm apical segment right upper lobe nodule (4/34) and 3 mm lingular nodule (4/68). Minimal dependent atelectasis in the right lower lobe. No pleural fluid. Airway is otherwise unremarkable. Musculoskeletal: Degenerative changes in the spine. CT ABDOMEN PELVIS FINDINGS Hepatobiliary: Liver is decreased in attenuation diffusely and is enlarged, 20.4 cm. Cholecystectomy. No biliary ductal dilatation. Pancreas: Negative. Spleen: Negative. Adrenals/Urinary Tract: Adrenal glands and kidneys are unremarkable. Ureters are decompressed. Bladder is grossly unremarkable. Stomach/Bowel: Tiny hiatal hernia. Stomach, small bowel, appendix and colon are unremarkable. Vascular/Lymphatic: Atherosclerotic calcification of the aorta. No pathologically enlarged lymph nodes. Small left inguinal lymph nodes are unchanged. Surgical clips in the right groin. Reproductive: Hysterectomy.  No adnexal mass. Other: No free fluid.  Musculoskeletal: Degenerative changes in the spine. L4-S1 posterior lumbar interbody fusion. IMPRESSION: 1. No acute findings. 2. Steatotic enlarged liver. 3. Tiny upper lobe pulmonary nodules. No follow-up needed if patient is low-risk (and has no known or suspected primary neoplasm). Non-contrast chest CT can be considered in 12 months if patient is high-risk. This recommendation follows the consensus statement: Guidelines for Management of Incidental Pulmonary Nodules Detected on CT Images: From the Fleischner Society 2017; Radiology 2017; 284:228-243. 4. Aortic atherosclerosis (ICD10-I70.0). Coronary artery calcification. Electronically Signed   By: Newell Eke M.D.   On: 08/30/2023 15:39   CT Head Wo Contrast Result Date: 08/30/2023 CLINICAL DATA:  Hypotensive with rash and shortness of breath. EXAM: CT HEAD WITHOUT CONTRAST TECHNIQUE: Contiguous axial images were obtained  from the base of the skull through the vertex without intravenous contrast. RADIATION DOSE REDUCTION: This exam was performed according to the departmental dose-optimization program which includes automated exposure control, adjustment of the mA and/or kV according to patient size and/or use of iterative reconstruction technique. COMPARISON:  September 03, 2017 FINDINGS: Brain: There is generalized cerebral atrophy with widening of the extra-axial spaces and ventricular dilatation. There are areas of decreased attenuation within the white matter tracts of the supratentorial brain, consistent with microvascular disease changes. A chronic left cerebellar infarct is noted. Vascular: No hyperdense vessel or unexpected calcification. Skull: Normal. Negative for fracture or focal lesion. Sinuses/Orbits: No acute finding. Other: None. IMPRESSION: 1. Generalized cerebral atrophy with chronic white matter small vessel ischemic changes. 2. Chronic left cerebellar infarct. 3. No acute intracranial abnormality. Electronically Signed   By: [name] Dials M.D.   On: 08/30/2023 15:36   DG Chest Portable 1 View Result Date: 08/30/2023 CLINICAL DATA:  Sepsis EXAM: PORTABLE CHEST - 1 VIEW COMPARISON:  October 04, 2017 FINDINGS: Low lung volumes. Patient is rotated towards the right. No focal airspace consolidation, pleural effusion, or pneumothorax. The cardiac silhouette is at the upper limits of normal, likely accentuated by AP technique and low lung volumes. Tortuous aorta with aortic atherosclerosis. No acute fracture or destructive lesions. Multilevel thoracic osteophytosis. IMPRESSION: No acute cardiopulmonary abnormality. Electronically Signed   By: Rogelia Myers M.D.   On: 08/30/2023 13:39    Microbiology: Recent Results (from the past 240 hours)  Culture, blood (routine x 2)     Status: None (Preliminary result)   Collection Time: 08/30/23 11:48 AM   Specimen: BLOOD  Result Value Ref Range Status   Specimen Description BLOOD BLOOD RIGHT FOREARM  Final   Special Requests   Final    BOTTLES DRAWN AEROBIC AND ANAEROBIC Blood Culture results may not be optimal due to an inadequate volume of blood received in culture bottles   Culture   Final    NO GROWTH 4 DAYS Performed at [facility], [address] [place], [place] [address]    Report Status PENDING  Incomplete  Culture, blood (routine x 2)     Status: None (Preliminary result)   Collection Time: 08/30/23 12:00 PM   Specimen: BLOOD  Result Value Ref Range Status   Specimen Description BLOOD LEFT AC  Final   Special Requests   Final    BOTTLES DRAWN AEROBIC AND ANAEROBIC Blood Culture adequate volume   Culture   Final    NO GROWTH 4 DAYS Performed at [facility], [address] [place], [place] [address]    Report Status PENDING  Incomplete  Respiratory (~20 pathogens) panel by PCR     Status: None   Collection Time: 08/30/23  3:06 PM   Specimen: Nasopharyngeal Swab; Respiratory  Result Value Ref Range Status   Adenovirus NOT DETECTED NOT  DETECTED Final   Coronavirus 229E NOT DETECTED NOT DETECTED Final    Comment: (NOTE) The Coronavirus on the Respiratory Panel, DOES NOT test for the novel  Coronavirus (2019 nCoV)    Coronavirus HKU1 NOT DETECTED NOT DETECTED Final   Coronavirus NL63 NOT DETECTED NOT DETECTED Final   Coronavirus OC43 NOT DETECTED NOT DETECTED Final   Metapneumovirus NOT DETECTED NOT DETECTED Final   Rhinovirus / Enterovirus NOT DETECTED NOT DETECTED Final   Influenza A NOT DETECTED NOT DETECTED Final   Influenza B NOT DETECTED NOT DETECTED Final   Parainfluenza Virus 1 NOT  DETECTED NOT DETECTED Final   Parainfluenza Virus 2 NOT DETECTED NOT DETECTED Final   Parainfluenza Virus 3 NOT DETECTED NOT DETECTED Final   Parainfluenza Virus 4 NOT DETECTED NOT DETECTED Final   Respiratory Syncytial Virus NOT DETECTED NOT DETECTED Final   Bordetella pertussis NOT DETECTED NOT DETECTED Final   Bordetella Parapertussis NOT DETECTED NOT DETECTED Final   Chlamydophila pneumoniae NOT DETECTED NOT DETECTED Final   Mycoplasma pneumoniae NOT DETECTED NOT DETECTED Final    Comment: Performed at [facility], [address], [place], [place] [address]  MRSA Next Gen by PCR, Nasal     Status: None   Collection Time: 08/30/23  5:47 PM   Specimen: Nasal Mucosa; Nasal Swab  Result Value Ref Range Status   MRSA by PCR Next Gen NOT DETECTED NOT DETECTED Final    Comment: (NOTE) The GeneXpert MRSA Assay (FDA approved for NASAL specimens only), is one component of a comprehensive MRSA colonization surveillance program. It is not intended to diagnose MRSA infection nor to guide or monitor treatment for MRSA infections. Test performance is not FDA approved in patients less than 75 years old. Performed at [facility], [address] [place], [place] [address]      Labs: Basic Metabolic Panel: Recent Labs  Lab 08/30/23 1147 08/31/23 0514 09/01/23 0350 09/02/23 0316  NA 137 135 137 141  K 4.4 4.5 4.3  4.2  CL 101 105 104 105  CO2 22 20* 24 26  GLUCOSE 162* 244* 161* 143*  BUN 29* 30* 27* 28*  CREATININE 2.27* 1.65* 1.18* 1.00  CALCIUM  8.4* 8.0* 8.2* 8.3*  MG  --  1.7 2.1 2.2  PHOS  --  3.1 2.5 2.3*   Liver Function Tests: Recent Labs  Lab 08/30/23 1147  AST 22  ALT 20  ALKPHOS 59  BILITOT 1.2  PROT 6.1*  ALBUMIN 3.1*   Recent Labs  Lab 08/30/23 1506  LIPASE 22   No results for input(s): AMMONIA in the last 168 hours. CBC: Recent Labs  Lab 08/30/23 1147 08/31/23 0514 09/01/23 0350 09/02/23 0316  WBC 16.5* 24.6* 25.7* 25.8*  NEUTROABS 11.9*  --   --   --   HGB 12.7 12.2 10.9* 10.3*  HCT 37.9 37.3 32.8* 31.0*  MCV 88.8 89.9 87.2 87.8  PLT 243 203 219 220   Cardiac Enzymes: No results for input(s): CKTOTAL, CKMB, CKMBINDEX, TROPONINI in the last 168 hours. BNP: BNP (last 3 results) No results for input(s): BNP in the last 8760 hours.  ProBNP (last 3 results) No results for input(s): PROBNP in the last 8760 hours.  CBG: Recent Labs  Lab 09/02/23 0820 09/02/23 1158 09/02/23 1658 09/02/23 1946 09/03/23 0814  GLUCAP 133* 163* 228* 192* 159*       Signed:  Devaughn KATHEE Ban MD.  [facility] 09/03/2023, 10:27 AM

## 2023-09-04 ENCOUNTER — Encounter: Payer: Self-pay | Admitting: Emergency Medicine

## 2023-09-04 ENCOUNTER — Inpatient Hospital Stay
Admission: EM | Admit: 2023-09-04 | Discharge: 2023-09-07 | DRG: 871 | Disposition: A | Discharge status: Home Health | Source: Skilled Nursing Facility | Attending: Internal Medicine | Admitting: Internal Medicine

## 2023-09-04 ENCOUNTER — Emergency Department

## 2023-09-04 ENCOUNTER — Other Ambulatory Visit: Payer: Self-pay

## 2023-09-04 ENCOUNTER — Inpatient Hospital Stay

## 2023-09-04 DIAGNOSIS — J189 Pneumonia, unspecified organism: Secondary | ICD-10-CM

## 2023-09-04 DIAGNOSIS — I152 Hypertension secondary to endocrine disorders: Secondary | ICD-10-CM | POA: Diagnosis present

## 2023-09-04 DIAGNOSIS — R7989 Other specified abnormal findings of blood chemistry: Secondary | ICD-10-CM

## 2023-09-04 DIAGNOSIS — K760 Fatty (change of) liver, not elsewhere classified: Secondary | ICD-10-CM | POA: Diagnosis present

## 2023-09-04 DIAGNOSIS — E66812 Obesity, class 2: Secondary | ICD-10-CM | POA: Diagnosis present

## 2023-09-04 DIAGNOSIS — Z888 Allergy status to other drugs, medicaments and biological substances status: Secondary | ICD-10-CM

## 2023-09-04 DIAGNOSIS — Z9221 Personal history of antineoplastic chemotherapy: Secondary | ICD-10-CM | POA: Diagnosis not present

## 2023-09-04 DIAGNOSIS — Y95 Nosocomial condition: Secondary | ICD-10-CM | POA: Diagnosis present

## 2023-09-04 DIAGNOSIS — Z85820 Personal history of malignant melanoma of skin: Secondary | ICD-10-CM | POA: Diagnosis not present

## 2023-09-04 DIAGNOSIS — T50905A Adverse effect of unspecified drugs, medicaments and biological substances, initial encounter: Secondary | ICD-10-CM | POA: Diagnosis not present

## 2023-09-04 DIAGNOSIS — I5A Non-ischemic myocardial injury (non-traumatic): Secondary | ICD-10-CM

## 2023-09-04 DIAGNOSIS — E119 Type 2 diabetes mellitus without complications: Secondary | ICD-10-CM

## 2023-09-04 DIAGNOSIS — N1831 Chronic kidney disease, stage 3a: Secondary | ICD-10-CM | POA: Diagnosis present

## 2023-09-04 DIAGNOSIS — E1140 Type 2 diabetes mellitus with diabetic neuropathy, unspecified: Secondary | ICD-10-CM | POA: Diagnosis present

## 2023-09-04 DIAGNOSIS — Z7984 Long term (current) use of oral hypoglycemic drugs: Secondary | ICD-10-CM

## 2023-09-04 DIAGNOSIS — G4700 Insomnia, unspecified: Secondary | ICD-10-CM | POA: Diagnosis present

## 2023-09-04 DIAGNOSIS — E1122 Type 2 diabetes mellitus with diabetic chronic kidney disease: Secondary | ICD-10-CM | POA: Diagnosis present

## 2023-09-04 DIAGNOSIS — R233 Spontaneous ecchymoses: Secondary | ICD-10-CM | POA: Diagnosis present

## 2023-09-04 DIAGNOSIS — C779 Secondary and unspecified malignant neoplasm of lymph node, unspecified: Secondary | ICD-10-CM | POA: Diagnosis present

## 2023-09-04 DIAGNOSIS — Z801 Family history of malignant neoplasm of trachea, bronchus and lung: Secondary | ICD-10-CM

## 2023-09-04 DIAGNOSIS — Z8249 Family history of ischemic heart disease and other diseases of the circulatory system: Secondary | ICD-10-CM

## 2023-09-04 DIAGNOSIS — Z833 Family history of diabetes mellitus: Secondary | ICD-10-CM

## 2023-09-04 DIAGNOSIS — J9601 Acute respiratory failure with hypoxia: Secondary | ICD-10-CM | POA: Diagnosis present

## 2023-09-04 DIAGNOSIS — I129 Hypertensive chronic kidney disease with stage 1 through stage 4 chronic kidney disease, or unspecified chronic kidney disease: Secondary | ICD-10-CM | POA: Diagnosis present

## 2023-09-04 DIAGNOSIS — T380X5A Adverse effect of glucocorticoids and synthetic analogues, initial encounter: Secondary | ICD-10-CM | POA: Diagnosis present

## 2023-09-04 DIAGNOSIS — R6520 Severe sepsis without septic shock: Secondary | ICD-10-CM

## 2023-09-04 DIAGNOSIS — E785 Hyperlipidemia, unspecified: Secondary | ICD-10-CM | POA: Diagnosis present

## 2023-09-04 DIAGNOSIS — Z79899 Other long term (current) drug therapy: Secondary | ICD-10-CM

## 2023-09-04 DIAGNOSIS — J1569 Pneumonia due to other gram-negative bacteria: Secondary | ICD-10-CM

## 2023-09-04 DIAGNOSIS — Z6839 Body mass index (BMI) 39.0-39.9, adult: Secondary | ICD-10-CM | POA: Diagnosis not present

## 2023-09-04 DIAGNOSIS — A419 Sepsis, unspecified organism: Principal | ICD-10-CM | POA: Diagnosis present

## 2023-09-04 DIAGNOSIS — F39 Unspecified mood [affective] disorder: Secondary | ICD-10-CM | POA: Diagnosis present

## 2023-09-04 DIAGNOSIS — R21 Rash and other nonspecific skin eruption: Secondary | ICD-10-CM | POA: Diagnosis present

## 2023-09-04 DIAGNOSIS — E1165 Type 2 diabetes mellitus with hyperglycemia: Secondary | ICD-10-CM | POA: Diagnosis present

## 2023-09-04 DIAGNOSIS — E559 Vitamin D deficiency, unspecified: Secondary | ICD-10-CM | POA: Diagnosis present

## 2023-09-04 DIAGNOSIS — Z823 Family history of stroke: Secondary | ICD-10-CM

## 2023-09-04 DIAGNOSIS — E271 Primary adrenocortical insufficiency: Secondary | ICD-10-CM | POA: Diagnosis present

## 2023-09-04 DIAGNOSIS — F32A Depression, unspecified: Secondary | ICD-10-CM | POA: Diagnosis present

## 2023-09-04 DIAGNOSIS — E2740 Unspecified adrenocortical insufficiency: Secondary | ICD-10-CM | POA: Insufficient documentation

## 2023-09-04 DIAGNOSIS — Z880 Allergy status to penicillin: Secondary | ICD-10-CM

## 2023-09-04 DIAGNOSIS — E1169 Type 2 diabetes mellitus with other specified complication: Secondary | ICD-10-CM | POA: Diagnosis present

## 2023-09-04 DIAGNOSIS — R0902 Hypoxemia: Principal | ICD-10-CM

## 2023-09-04 DIAGNOSIS — F419 Anxiety disorder, unspecified: Secondary | ICD-10-CM | POA: Diagnosis present

## 2023-09-04 DIAGNOSIS — R7401 Elevation of levels of liver transaminase levels: Secondary | ICD-10-CM | POA: Insufficient documentation

## 2023-09-04 DIAGNOSIS — T782XXA Anaphylactic shock, unspecified, initial encounter: Secondary | ICD-10-CM | POA: Diagnosis present

## 2023-09-04 DIAGNOSIS — R262 Difficulty in walking, not elsewhere classified: Secondary | ICD-10-CM

## 2023-09-04 DIAGNOSIS — D7212 Drug rash with eosinophilia and systemic symptoms syndrome: Secondary | ICD-10-CM | POA: Diagnosis present

## 2023-09-04 DIAGNOSIS — I10 Essential (primary) hypertension: Secondary | ICD-10-CM | POA: Diagnosis present

## 2023-09-04 LAB — CBC WITH DIFFERENTIAL/PLATELET
Abs Immature Granulocytes: 2.22 K/uL — ABNORMAL HIGH (ref 0.00–0.07)
Basophils Absolute: 0.2 K/uL — ABNORMAL HIGH (ref 0.0–0.1)
Basophils Relative: 1 %
Eosinophils Absolute: 1.2 K/uL — ABNORMAL HIGH (ref 0.0–0.5)
Eosinophils Relative: 6 %
HCT: 34.1 % — ABNORMAL LOW (ref 36.0–46.0)
Hemoglobin: 10.8 g/dL — ABNORMAL LOW (ref 12.0–15.0)
Immature Granulocytes: 11 %
Lymphocytes Relative: 30 %
Lymphs Abs: 5.9 K/uL — ABNORMAL HIGH (ref 0.7–4.0)
MCH: 28.8 pg (ref 26.0–34.0)
MCHC: 31.7 g/dL (ref 30.0–36.0)
MCV: 90.9 fL (ref 80.0–100.0)
Monocytes Absolute: 1.1 K/uL — ABNORMAL HIGH (ref 0.1–1.0)
Monocytes Relative: 5 %
Neutro Abs: 9.3 K/uL — ABNORMAL HIGH (ref 1.7–7.7)
Neutrophils Relative %: 47 %
Platelets: 185 K/uL (ref 150–400)
RBC: 3.75 MIL/uL — ABNORMAL LOW (ref 3.87–5.11)
RDW: 14 % (ref 11.5–15.5)
Smear Review: NORMAL
WBC: 19.8 K/uL — ABNORMAL HIGH (ref 4.0–10.5)
nRBC: 0.8 % — ABNORMAL HIGH (ref 0.0–0.2)

## 2023-09-04 LAB — COMPREHENSIVE METABOLIC PANEL WITH GFR
ALT: 43 U/L (ref 0–44)
AST: 46 U/L — ABNORMAL HIGH (ref 15–41)
Albumin: 2.8 g/dL — ABNORMAL LOW (ref 3.5–5.0)
Alkaline Phosphatase: 82 U/L (ref 38–126)
Anion gap: 10 (ref 5–15)
BUN: 18 mg/dL (ref 8–23)
CO2: 24 mmol/L (ref 22–32)
Calcium: 7.9 mg/dL — ABNORMAL LOW (ref 8.9–10.3)
Chloride: 107 mmol/L (ref 98–111)
Creatinine, Ser: 1.06 mg/dL — ABNORMAL HIGH (ref 0.44–1.00)
GFR, Estimated: 56 mL/min — ABNORMAL LOW (ref >60)
Glucose, Bld: 184 mg/dL — ABNORMAL HIGH (ref 70–99)
Potassium: 3.8 mmol/L (ref 3.5–5.1)
Sodium: 141 mmol/L (ref 135–145)
Total Bilirubin: 1.2 mg/dL (ref 0.0–1.2)
Total Protein: 6.4 g/dL — ABNORMAL LOW (ref 6.5–8.1)

## 2023-09-04 LAB — CULTURE, BLOOD (ROUTINE X 2)
Culture: NO GROWTH
Culture: NO GROWTH
Special Requests: ADEQUATE

## 2023-09-04 LAB — LACTIC ACID, PLASMA: Lactic Acid, Venous: 1.6 mmol/L (ref 0.5–1.9)

## 2023-09-04 LAB — RESP PANEL BY RT-PCR (RSV, FLU A&B, COVID)  RVPGX2
Influenza A by PCR: NEGATIVE
Influenza B by PCR: NEGATIVE
Resp Syncytial Virus by PCR: NEGATIVE
SARS Coronavirus 2 by RT PCR: NEGATIVE

## 2023-09-04 LAB — BRAIN NATRIURETIC PEPTIDE: B Natriuretic Peptide: 305.7 pg/mL — ABNORMAL HIGH (ref 0.0–100.0)

## 2023-09-04 LAB — TROPONIN I (HIGH SENSITIVITY)
Troponin I (High Sensitivity): 90 ng/L — ABNORMAL HIGH (ref <18)
Troponin I (High Sensitivity): 114 ng/L (ref <18)

## 2023-09-04 LAB — LIPASE, BLOOD: Lipase: 35 U/L (ref 11–51)

## 2023-09-04 LAB — PROCALCITONIN: Procalcitonin: 0.25 ng/mL

## 2023-09-04 MED ORDER — SODIUM CHLORIDE 0.9 % IV SOLN
2.0000 g | Freq: Once | INTRAVENOUS | Status: AC
  Administered 2023-09-04: 2 g via INTRAVENOUS
  Filled 2023-09-04: qty 12.5

## 2023-09-04 MED ORDER — IPRATROPIUM-ALBUTEROL 0.5-2.5 (3) MG/3ML IN SOLN
3.0000 mL | Freq: Once | RESPIRATORY_TRACT | Status: AC
  Administered 2023-09-04: 3 mL via RESPIRATORY_TRACT
  Filled 2023-09-04: qty 3

## 2023-09-04 MED ORDER — SODIUM CHLORIDE 0.9 % IV SOLN: 500.0000 mg | Freq: Two times a day (BID) | INTRAVENOUS | Status: DC

## 2023-09-04 MED ORDER — VANCOMYCIN HCL IN DEXTROSE 1-5 GM/200ML-% IV SOLN: 1000.0000 mg | INTRAVENOUS | Status: DC

## 2023-09-04 MED ORDER — DIPHENHYDRAMINE HCL 50 MG/ML IJ SOLN
12.5000 mg | Freq: Once | INTRAMUSCULAR | Status: AC
  Administered 2023-09-04: 12.5 mg via INTRAVENOUS
  Filled 2023-09-04: qty 1

## 2023-09-04 MED ORDER — VANCOMYCIN HCL IN DEXTROSE 1-5 GM/200ML-% IV SOLN
1000.0000 mg | Freq: Once | INTRAVENOUS | Status: AC
  Administered 2023-09-04: 1000 mg via INTRAVENOUS
  Filled 2023-09-04: qty 200

## 2023-09-04 MED ORDER — SODIUM CHLORIDE 0.9 % IV BOLUS
250.0000 mL | Freq: Once | INTRAVENOUS | Status: AC
  Administered 2023-09-04: 250 mL via INTRAVENOUS

## 2023-09-04 MED ORDER — INSULIN ASPART 100 UNIT/ML IJ SOLN
0.0000 [IU] | INTRAMUSCULAR | Status: DC
  Administered 2023-09-05: 8 [IU] via SUBCUTANEOUS
  Administered 2023-09-05: 11 [IU] via SUBCUTANEOUS
  Administered 2023-09-05: 5 [IU] via SUBCUTANEOUS
  Administered 2023-09-05: 3 [IU] via SUBCUTANEOUS
  Administered 2023-09-05: 5 [IU] via SUBCUTANEOUS
  Administered 2023-09-05: 8 [IU] via SUBCUTANEOUS
  Administered 2023-09-05 – 2023-09-06 (×3): 5 [IU] via SUBCUTANEOUS
  Administered 2023-09-06: 3 [IU] via SUBCUTANEOUS
  Administered 2023-09-06: 8 [IU] via SUBCUTANEOUS
  Administered 2023-09-06 – 2023-09-07 (×2): 5 [IU] via SUBCUTANEOUS
  Administered 2023-09-07 (×3): 3 [IU] via SUBCUTANEOUS
  Filled 2023-09-04 (×16): qty 1

## 2023-09-04 MED ORDER — FAMOTIDINE IN NACL 20-0.9 MG/50ML-% IV SOLN
20.0000 mg | Freq: Once | INTRAVENOUS | Status: AC
  Administered 2023-09-04: 20 mg via INTRAVENOUS
  Filled 2023-09-04: qty 50

## 2023-09-04 MED ORDER — IOHEXOL 350 MG/ML SOLN
100.0000 mL | Freq: Once | INTRAVENOUS | Status: AC | PRN
  Administered 2023-09-04: 100 mL via INTRAVENOUS

## 2023-09-04 MED ORDER — SODIUM CHLORIDE 0.9 % IV SOLN
250.0000 mg | Freq: Two times a day (BID) | INTRAVENOUS | Status: AC
  Administered 2023-09-05 – 2023-09-06 (×4): 250 mg via INTRAVENOUS
  Filled 2023-09-04 (×6): qty 250

## 2023-09-04 MED ORDER — VANCOMYCIN HCL 1500 MG/300ML IV SOLN
1500.0000 mg | Freq: Once | INTRAVENOUS | Status: AC
  Administered 2023-09-04: 1500 mg via INTRAVENOUS
  Filled 2023-09-04: qty 300

## 2023-09-04 MED ORDER — ENOXAPARIN SODIUM 60 MG/0.6ML IJ SOSY
0.5000 mg/kg | PREFILLED_SYRINGE | INTRAMUSCULAR | Status: DC
  Administered 2023-09-05 – 2023-09-06 (×3): 52.5 mg via SUBCUTANEOUS
  Filled 2023-09-04 (×3): qty 0.6

## 2023-09-04 MED ORDER — ACETAMINOPHEN 500 MG PO TABS
1000.0000 mg | ORAL_TABLET | Freq: Once | ORAL | Status: AC
  Administered 2023-09-04: 1000 mg via ORAL
  Filled 2023-09-04: qty 2

## 2023-09-04 MED ORDER — METHYLPREDNISOLONE SODIUM SUCC 125 MG IJ SOLR
125.0000 mg | Freq: Once | INTRAMUSCULAR | Status: AC
  Administered 2023-09-04: 125 mg via INTRAVENOUS
  Filled 2023-09-04: qty 2

## 2023-09-04 MED ORDER — SODIUM CHLORIDE 0.9 % IV SOLN
2.0000 g | Freq: Two times a day (BID) | INTRAVENOUS | Status: DC
  Administered 2023-09-05 – 2023-09-07 (×5): 2 g via INTRAVENOUS
  Filled 2023-09-04 (×5): qty 12.5

## 2023-09-04 MED ORDER — FUROSEMIDE 10 MG/ML IJ SOLN
40.0000 mg | Freq: Once | INTRAMUSCULAR | Status: AC
  Administered 2023-09-04: 40 mg via INTRAVENOUS
  Filled 2023-09-04: qty 4

## 2023-09-04 NOTE — Progress Notes (Signed)
 Anticoagulation monitoring(Lovenox ):  73 yo female ordered Lovenox  40 mg Q24h    Filed Weights   09/04/23 1743  Weight: 105.2 kg (232 lb)   BMI 39.8    Lab Results  Component Value Date   CREATININE 1.06 (H) 09/04/2023   CREATININE 1.00 09/02/2023   CREATININE 1.18 (H) 09/01/2023   Estimated Creatinine Clearance: 56.7 mL/min (A) (by C-G formula based on SCr of 1.06 mg/dL (H)). Hemoglobin & Hematocrit     Component Value Date/Time   HGB 10.8 (L) 09/04/2023 1755   HGB 12.6 04/15/2023 0931   HCT 34.1 (L) 09/04/2023 1755   HCT 38.6 04/15/2023 0931     Per Protocol for Patient with estCrcl > 30 ml/min and BMI > 30, will transition to Lovenox  52.5 mg Q24h.

## 2023-09-04 NOTE — Progress Notes (Signed)
 Pharmacy Antibiotic Note  Sarah [name] is a 73 y.o. female admitted on 09/04/2023 with bacteremia.  Pharmacy has been consulted for Vanc, cefepime  dosing.  Plan: Cefepime  2 gm IV X 1 given in ED on 8/16 @ 1850. Cefepime  2 gm IV Q12H ordered to start on 8/17 @ 0700.  Vancomycin  1500 mg IV X 1 given in ED on 8/16 @ 1926.  Additional Vanc 1 gm IV X 1 ordered to make total loading dose of 2500 mg.  Vancomycin  1 gm IV Q24H ordered to start on 8/17 @ 1900.  AUC = 490.2 Vanc trough = 12.9   Height: 5' 4 (162.6 cm) Weight: 105.2 kg (232 lb) IBW/kg (Calculated) : 54.7  Temp (24hrs), Avg:101.3 F (38.5 C), Min:101.2 F (38.4 C), Max:101.3 F (38.5 C)  Recent Labs  Lab 08/30/23 1147 08/30/23 1506 08/31/23 0514 09/01/23 0350 09/02/23 0316 09/04/23 1755  WBC 16.5*  --  24.6* 25.7* 25.8* 19.8*  CREATININE 2.27*  --  1.65* 1.18* 1.00 1.06*  LATICACIDVEN 3.1* 2.7*  --   --   --  1.6    Estimated Creatinine Clearance: 56.7 mL/min (A) (by C-G formula based on SCr of 1.06 mg/dL (H)).    Allergies  Allergen Reactions   Clindamycin/Lincomycin Anaphylaxis   Doxycycline  Rash   Aspirin  Other (See Comments)    Pt is unsure of reaction, but states that she is allergic to aspirin    Ciprofloxacin Rash    very bad rash   Penicillins Rash    Has patient had a PCN reaction causing immediate rash, facial/tongue/throat swelling, SOB or lightheadedness with hypotension: No Has patient had a PCN reaction causing severe rash involving mucus membranes or skin necrosis: No Has patient had a PCN reaction that required hospitalization: No Has patient had a PCN reaction occurring within the last 10 years: No If all of the above answers are NO, then may proceed with Cephalosporin use.     Antimicrobials this admission:   >>    >>   Dose adjustments this admission:   Microbiology results:  BCx:   UCx:    Sputum:    MRSA PCR:   Thank you for allowing pharmacy to be a part of this  patient's care.  Ratasha Fabre D 09/04/2023 10:30 PM

## 2023-09-04 NOTE — ED Notes (Signed)
 Unable to obtain 2nd set of blood cultures due to difficult IV start.

## 2023-09-04 NOTE — ED Notes (Signed)
 Lab called for blood cultures.

## 2023-09-04 NOTE — ED Provider Notes (Signed)
 Sarah [name] Provider Note    Event Date/Time   First MD Initiated Contact with Patient 09/04/23 1743     (approximate)   History   Respiratory Distress   HPI  Sarah [name] is a 73 y.o. female  HTN, DM2, neuropathy, depression , prolonged QT syndrome, history of malignant melanoma, obesity, vitamin D  deficiency Admitted 8/11-8/14 for anaphylaxis likely secondary to clindamycin from a root canal that resulted in a rash requiring pressors in the ICU who we presents from home with 6 hours of progressively worsening shortness of breath and weakness.  Patient states that since discharge she has had pain in her upper back but denies any chest pain.  She reports that is difficult to take a full breath in.  She does have a mild cough without production.  Denies any abdominal pain nausea or changes in urinary habits.  She states that she thinks that her rash is becoming more erythematous similar to what brought her into the hospital and initially.  She does not use oxygen at baseline.  Per EMS she was hypoxic to the 70s initially and they have on 6 L on arrival.  Glucose was greater than 100      Physical Exam   Triage Vital Signs: ED Triage Vitals [09/04/23 1743]  Encounter Vitals Group     BP      Girls Systolic BP Percentile      Girls Diastolic BP Percentile      Boys Systolic BP Percentile      Boys Diastolic BP Percentile      Pulse      Resp      Temp      Temp src      SpO2      Weight      Height      Head Circumference      Peak Flow      Pain Score 0     Pain Loc      Pain Education      Exclude from Growth Chart     Most recent vital signs: Vitals:   09/04/23 2000 09/04/23 2045  BP: (!) 133/53 (!) 139/59  Pulse: 75 88  Resp: 20 17  Temp:    SpO2: 94% 91%    Nursing Triage Note reviewed. Vital signs reviewed and patients oxygen saturation is hypoxic, we did test this on room air and she desatted to 82%  General: Patient is well  nourished, well developed, awake and alert, appears unwell Head: Normocephalic and atraumatic Eyes: Normal inspection, extraocular muscles intact, no conjunctival pallor Ear, nose, throat: Normal external exam Neck: Normal range of motion Respiratory: Patient is in moderate respiratory distress, lungs rhonchi and wheezes Cardiovascular: Patient is tachycardic, RR without murmur appreciated GI: Abd SNT with no guarding or rebound  Back: Normal inspection of the back with good strength and range of motion throughout all ext Extremities: pulses intact with good cap refills, no LE pitting edema or calf tenderness Neuro: The patient is alert and oriented to person, place, and time, appropriately conversive, with 5/5 bilat UE/LE strength, no gross motor or sensory defects noted. Coordination appears to be adequate. Skin: Warm, dry, and intact Psych: normal mood and affect, no SI or HI  ED Results / Procedures / Treatments   Labs (all labs ordered are listed, but only abnormal results are displayed) Labs Reviewed  CBC WITH DIFFERENTIAL/PLATELET - Abnormal; Notable for the following components:  Result Value   WBC 19.8 (*)    RBC 3.75 (*)    Hemoglobin 10.8 (*)    HCT 34.1 (*)    nRBC 0.8 (*)    Neutro Abs 9.3 (*)    Lymphs Abs 5.9 (*)    Monocytes Absolute 1.1 (*)    Eosinophils Absolute 1.2 (*)    Basophils Absolute 0.2 (*)    Abs Immature Granulocytes 2.22 (*)    All other components within normal limits  COMPREHENSIVE METABOLIC PANEL WITH GFR - Abnormal; Notable for the following components:   Glucose, Bld 184 (*)    Creatinine, Ser 1.06 (*)    Calcium  7.9 (*)    Total Protein 6.4 (*)    Albumin 2.8 (*)    AST 46 (*)    GFR, Estimated 56 (*)    All other components within normal limits  BRAIN NATRIURETIC PEPTIDE - Abnormal; Notable for the following components:   B Natriuretic Peptide 305.7 (*)    All other components within normal limits  TROPONIN I (HIGH SENSITIVITY) -  Abnormal; Notable for the following components:   Troponin I (High Sensitivity) 90 (*)    All other components within normal limits  TROPONIN I (HIGH SENSITIVITY) - Abnormal; Notable for the following components:   Troponin I (High Sensitivity) 114 (*)    All other components within normal limits  RESP PANEL BY RT-PCR (RSV, FLU A&B, COVID)  [id number]  CULTURE, BLOOD (ROUTINE X 2)  CULTURE, BLOOD (ROUTINE X 2)  LIPASE, BLOOD  LACTIC ACID, PLASMA  PROCALCITONIN     EKG EKG and rhythm strip are interpreted by myself:   EKG: [Normal sinus rhythm] at heart rate of 90, wide QRS duration, QTc 458, nonspecific ST segments and T waves no ectopy EKG not consistent with Acute STEMI Rhythm strip: NSR in lead II No significantly changed from 08/31/23   RADIOLOGY XR chest: Opacities consistent with pneumonia on my independent review interpretation radiologist agrees CT PE    PROCEDURES:  Critical Care performed: Yes, see critical care procedure note(s)  .Critical Care  Performed by: Nicholaus Rolland BRAVO, MD Authorized by: Nicholaus Rolland BRAVO, MD   Critical care provider statement:    Critical care time (minutes):  30   Critical care was necessary to treat or prevent imminent or life-threatening deterioration of the following conditions:  Sepsis   Critical care was time spent personally by me on the following activities:  Development of treatment plan with patient or surrogate, discussions with consultants, evaluation of patient's response to treatment, examination of patient, ordering and review of laboratory studies, ordering and review of radiographic studies, ordering and performing treatments and interventions, pulse oximetry, re-evaluation of patient's condition and review of old charts Comments:     Requiring multiple reassessments for anaphylaxis and broad-spectrum antibiotics for sepsis for HCAP    MEDICATIONS ORDERED IN ED: Medications  ipratropium-albuterol  (DUONEB) 0.5-2.5 (3)  MG/3ML nebulizer solution 3 mL (3 mLs Nebulization Given 09/04/23 1757)  sodium chloride  0.9 % bolus 250 mL (0 mLs Intravenous Stopped 09/04/23 1825)  methylPREDNISolone  sodium succinate  (SOLU-MEDROL ) 125 mg/2 mL injection 125 mg (125 mg Intravenous Given 09/04/23 1757)  famotidine  (PEPCID ) IVPB 20 mg premix (0 mg Intravenous Stopped 09/04/23 1846)  diphenhydrAMINE  (BENADRYL ) injection 12.5 mg (12.5 mg Intravenous Given 09/04/23 1757)  vancomycin  (VANCOREADY) IVPB 1500 mg/300 mL (1,500 mg Intravenous New Bag/Given 09/04/23 1926)  ceFEPIme  (MAXIPIME ) 2 g in sodium chloride  0.9 % 100 mL IVPB (0 g Intravenous Stopped 09/04/23 1928)  acetaminophen  (TYLENOL ) tablet 1,000 mg (1,000 mg Oral Given 09/04/23 1924)  iohexol  (OMNIPAQUE ) 350 MG/ML injection 100 mL (100 mLs Intravenous Contrast Given 09/04/23 1934)     IMPRESSION / MDM / ASSESSMENT AND PLAN / ED COURSE                                Differential diagnosis includes, but is not limited to, recurrence of anaphylactic shock, sepsis, HCAP, pulmonary embolism, electrolyte derangement, ACS   ED course: Patient arrives and she is hypoxic and did desat to 82% on room air.  She is currently satting well on 3 L of oxygen.  Patient denies any chest pain but troponin is elevated to 90.  Will obtain a repeat EKG. given patient's reoccurrence of her rash that is getting worse and her wheezes I was concerned about the possibility of recurrence of septic shock.  She did receive 125 mg of Solu-Medrol , IV Benadryl  and IV Pepcid .  Unfortunately this does complicate interpretation of her white blood cell count is elevated but she is febrile here and given opacities on chest x-ray I did decide to cover for HCAP within the limits of patient's known allergies to medications.  She is at high risk for pulmonary embolism so CT PE is ordered.  Anticipate patient will require admission today   Clinical Course as of 09/04/23 2151  Sat Sep 04, 2023  1820 WBC(!): 19.8 May be  secondary to steroids [HD]  1820 DG Chest Portable 1 View Appears consistent with pneumonia will cover for HCAP [HD]  1848 Troponin I (High Sensitivity)(!): 90 Significantly higher than baseline [HD]  1848 Creatinine(!): 1.06 At patient's baseline able to get the CT PE [HD]  1901 Patient's daughter updated at bedside.  Patient denies any chest pain still.  States that her main complaint is back pain.  We are awaiting the CT results [HD]  1907 Lactic Acid, Venous: 1.6 Not elevated [HD]  2033 CT Angio Chest PE W and/or Wo Contrast No pulmonary emboli but could be consistent with pneumonia [HD]  2102 Troponin I (High Sensitivity)(!!): 114 More elevated than the first will talk to cardiology [HD]  2119 Case discussed with cardiologist Agbor-Etang.  We reviewed the pertinent details over the phone including the rising troponins but that the patient does not have any chest pain currently and EKGs demonstrated no ST elevation.  We are both in agreement that this is likely type II ischemia given the fever and evidence of pneumonia and he recommends continuing to trend the troponins but no anticoagulation at this time.  Will call the patient in for admission [HD]    Clinical Course User Index [HD] Nicholaus Rolland BRAVO, MD     FINAL CLINICAL IMPRESSION(S) / ED DIAGNOSES   Final diagnoses:  Hypoxia  Sepsis with acute organ dysfunction without septic shock, due to unspecified organism, unspecified organ dysfunction type (HCC)  Rash  HCAP (healthcare-associated pneumonia)     Rx / DC Orders   ED Discharge Orders     None        Note:  This document was prepared using Dragon voice recognition software and may include unintentional dictation errors.   Nicholaus Rolland BRAVO, MD 09/04/23 2152

## 2023-09-04 NOTE — ED Triage Notes (Signed)
 Pt from home via [facility] with reports of SHOB and weakness. Pt reports she was d/c from the hospital yesterday morning for septic shock vs anaphylaxis shock. Pt noted to be febrile.

## 2023-09-04 NOTE — Progress Notes (Signed)
 ED Pharmacy Antibiotic Sign Off An antibiotic consult was received from an ED provider for Cefepime  per pharmacy dosing for bacteremia. A chart review was completed to assess appropriateness.   The following one time order(s) were placed:  Cefepime  2g IV x 1  Further antibiotic and/or antibiotic pharmacy consults should be ordered by the admitting provider if indicated.   Thank you for allowing pharmacy to be a part of this patient's care.   Olam KANDICE Fritter, [facility]  Clinical Pharmacist 09/04/23 6:28 PM

## 2023-09-04 NOTE — H&P (Addendum)
 History and Physical    Patient: Sarah [name] FMW:[id number] DOB: Jun 05, 1950 DOA: 09/04/2023 DOS: the patient was seen and examined on 09/05/2023 PCP: Cannady, [name] T, NP  Patient coming from: SNF    Chief Complaint:  Chief Complaint  Patient presents with   Respiratory Distress   HPI:  73 year old female with past medical history of type 2 diabetes mellitus, neuropathy, depression, prolonged QT syndrome, malignant melanoma and vitamin D  deficiency recently discharged from the hospital on September 03, 2023 following ICU admission for anaphylaxis thought to be due to clindamycin in the setting of root canal treatment.  The patient required vasopressors and was noted to have respiratory failure and widespread rash.  Although the patient stated to the emergency department physician she thought the rash was getting worse the patient stated to myself she felt like it was the same but she started to get shortness of breath worsening over the course of the day with mild nonproductive cough over the last 2 weeks no fever or chills, no chest pain and her back pain is at baseline denies any orthopnea but reports increasing dependent swelling she has more swelling on the right since her melanoma but now both sides are swollen right greater than left which is her baseline patient presented with SpO2 in the 70s and dyspneic requiring 6 L as well as febrile. No abdominal pain.  Case discussed with the emergency department provider and subsequently admitted the patient.Patient received Pepcid  125 mg methylprednisolone  and diphenhydramine  in the emergency department  Review of Systems: Limited from Dyspnea Past Medical History:  Diagnosis Date   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HCC)    Adrenal insufficiency (HCC)    Anxiety    B12 deficiency    Cancer (HCC) 2018-2019   melanoma   Cervical radiculitis    Constipation    Dependent edema    Depression    Diabetes mellitus    PRE,NO MEDICATIONS AS OF 03/05/21    Dysmenorrhea    H/O: suicide attempt    History of antineoplastic chemotherapy    History of chemotherapy    Hyperlipidemia    Hypertension    Insomnia    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HCC)    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 She has never been exposed to tobacco smoke. She has never used smokeless tobacco. She reports that she does not currently use alcohol. She reports that she does not use drugs.  Allergies  Allergen Reactions   Clindamycin/Lincomycin Anaphylaxis   Doxycycline  Rash   Aspirin  Other (See Comments)    Pt is unsure of reaction, but states that she is allergic to aspirin    Ciprofloxacin Rash    very bad rash   Penicillins Rash    Has patient had a PCN reaction causing immediate rash, facial/tongue/throat swelling, SOB or lightheadedness with hypotension: No Has  patient had a PCN reaction causing severe rash involving mucus membranes or skin necrosis: No Has patient had a PCN reaction that required hospitalization: No Has patient had a PCN reaction occurring within the last 10 years: No If all of the above answers are NO, then may proceed with Cephalosporin use.     Family History  Problem Relation Age of Onset   Depression Mother    Anxiety disorder Mother    Bipolar disorder Mother    Diabetes Mother    Mental illness Mother    Heart disease Mother    Stroke Mother    Lung cancer Father    Diabetes Maternal Grandmother     Heart attack Maternal Grandmother    CVA Paternal Grandmother    Colon cancer Neg Hx    Liver cancer Neg Hx    Pancreatic cancer Neg Hx    Esophageal cancer Neg Hx    Stomach cancer Neg Hx    Rectal cancer Neg Hx    Colon polyps Neg Hx    Breast cancer Neg Hx     Prior to Admission medications   Medication Sig Start Date End Date Taking? Authorizing Provider  amitriptyline  (ELAVIL ) 50 MG tablet Take 1 tablet (50 mg total) by mouth at bedtime. 07/13/23  Yes White, Redell A, NP  amLODipine  (NORVASC ) 2.5 MG tablet Take 1 tablet (2.5 mg total) by mouth daily. 04/15/23  Yes Cannady, [name] T, NP  Baclofen  5 MG TABS TAKE 1 TABLET BY MOUTH EVERY 8 HOURS AS NEEDED 11/13/22  Yes Cannady, [name] T, NP  Cholecalciferol (VITAMIN D ) 50 MCG (2000 UT) CAPS Take 1 capsule by mouth at bedtime.   Yes [provider]  Cyanocobalamin (VITAMIN B-12 PO) Take 1 tablet by mouth at bedtime.   Yes [provider]  EPINEPHrine  (EPIPEN  2-PAK) 0.3 mg/0.3 mL IJ SOAJ injection Inject 0.3 mg into the muscle as needed for anaphylaxis. 09/03/23  Yes Wouk, Devaughn Sayres, MD  escitalopram  (LEXAPRO ) 10 MG tablet Take 1 tablet (10 mg total) by mouth daily. 07/13/23  Yes White, Redell A, NP  FIBER ADULT GUMMIES PO Take 1 tablet by mouth daily as needed.   Yes [provider]  furosemide  (LASIX ) 40 MG tablet TAKE 1 TABLET BY MOUTH EVERY DAY 03/08/23  Yes Cannady, [name] T, NP  HYDROcodone -acetaminophen  (NORCO/VICODIN) 5-325 MG tablet Take 1 tablet by mouth every 6 (six) hours as needed for moderate pain (pain score 4-6).   Yes [provider]  hydrocortisone  (CORTEF ) 5 MG tablet TAKE THREE TABLETS IN THE MORNING AND ONE TABLET IN THE AFTERNOON 06/07/23  Yes [provider]  losartan  (COZAAR ) 50 MG tablet Take 1 tablet (50 mg total) by mouth daily. 04/15/23  Yes Cannady, [name] T, NP  meclizine  (ANTIVERT ) 25 MG tablet Take 1 tablet (25 mg total) by mouth 3 (three) times daily as needed for  dizziness. 04/28/22  Yes Cannady, [name] T, NP  metFORMIN  (GLUCOPHAGE ) 1000 MG tablet Take 1 tablet (1,000 mg total) by mouth 2 (two) times daily with a meal. 04/15/23  Yes Cannady, [name] T, NP  pregabalin  (LYRICA ) 150 MG capsule 150 mg in am, 150 MG in afternoon, and 300 mg in pm   Yes [provider]  rosuvastatin  (CRESTOR ) 10 MG tablet Take 1 tablet (10 mg total) by mouth daily. 04/15/23  Yes Cannady, [name] T, NP  Suvorexant  (BELSOMRA ) 20 MG TABS Take 1 tablet (20 mg total) by mouth at bedtime. 07/13/23  Yes [name],  NP  acetaminophen  (TYLENOL ) 500 MG tablet Take 1,000 mg by mouth every 8 (eight) hours as needed for mild pain (pain score 1-3) or moderate pain (pain score 4-6).    [provider]  Blood Glucose Monitoring Suppl (ACCU-CHEK GUIDE) w/Device KIT To check blood sugar 2-3 times daily and document for visits. 09/01/23   Cannady, [name] T, NP  Blood Glucose Monitoring Suppl (ONETOUCH VERIO) w/Device KIT Use to check blood sugar 3 times a day and document results, bring to appointments.  Goal is <130 fasting blood sugar and <180 two hours after meals. 03/17/22   Cannady, [name] T, NP  lidocaine  (LIDODERM ) 5 % PLACE 1 PATCH ONTO THE SKIN DAILY. REMOVE & DISCARD PATCH WITHIN 12 HOURS OR AS DIRECTED BY MD Patient not taking: Reported on 09/04/2023 06/03/23   Cannady, Melanie T, NP  OneTouch UltraSoft 2 Lancets MISC USE TO CHECK BLOOD SUGAR 3 TIMES A DAY AND DOCUMENT RESULTS, BRING TO APPOINTMENTS. GOAL IS <130 FASTING BLOOD SUGAR AND <180 TWO HOURS AFTER MEALS. 03/23/23   Valerio Melanie T, NP    Physical Exam: Vitals:   09/04/23 2000 09/04/23 2045 09/04/23 2130 09/04/23 2215  BP: (!) 133/53 (!) 139/59 135/66 133/76  Pulse: 75 88 81 79  Resp: 20 17 17 16   Temp:    98.2 F (36.8 C)  TempSrc:    Oral  SpO2: 94% 91% 94% 95%  Weight:      Height:      Patient seen approx 10:01 PM 09/04/2023 ED room 2 Constitutional:  Vital Signs as per Above Eyes:  Pink Conjunctiva and  no Ptosis ENMT:               Examination of teeth lips and gums:Muscosal involvement roof of mouth and right [place] border Neck:     Trachea Midline, Neck Symmetric             Thyroid  without tenderness, palpable masses or nodules Respiratory:   Respiratory Effort Labored: Intermittent Use of Respiratory Muscles,No  Intercostal Retractions             Lungs Bilateral Crackles to Auscultation Bilaterally Cardiovascular:   Heart Auscultated: Regular Regular without any added sounds or murmurs              B/L Pitting Edema R>L Gastrointestinal:  Abdomen soft and nontender without palpable masses, guarding or rebound  No Palpable Splenomegaly or Hepatomegaly Psychiatric:  Patient Orientated to Time, Place and Person Patient with appropriate mood and affect Recent and Remote Memory Intact Skin: Maculopapular Rash head to toe, some purpura, no periorbital edema or angioedema of face currently (reported past)  Data Reviewed: Labs, Radiology, ECG as detailed in HPI and A/P  Assessment and Plan: * 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 Patient has fever, previous facial edema, lymphadenopathy, rash and organ involvement Patient has a Event organiser for Dress of 3 (Possible) given enlarged lymph node seen on CT the atypical lymphocytes the eosinophilia the extensive skin rash extend and her edema and purpura Patient has eosinophils of 1.2K per UL, immature granulocytes and reactive benign lymphocytes and left shift with Polychromasia  Checking LDH, ferritin, ultrasound abdomen for hepatosplenomegaly or more nodes, triglycerides, echo for any cardiac involvement, monitoring for potential lung involvement no evidence of any CNS involvement, no necrosis to think TENS or SJS Plan: methylprednisolone  250mg  BID (lower than standard  dose of 30 mg/kg for 3 day given clinical stability but still concern cardiac or lung involvement) and if clinical course were  to worsen ICU consultation keepin view  other immunosuppressive, TSH, ANA and hepatitis serologies  Acute hypoxic respiratory failure (HCC) Acute hypoxic respiratory failure CT with new bilateral effusions, edema versus pneumonia, suspect primarily a volume overload or Dress-induced interstitial pneumonitis Differential would also be potentially infection (Procal Negative, BNP up, WBC trending down) Dyspnea and desaturates less than 89% off of oxygen Plan: Continuous pulse oximetry, supplemental oxygen titrated to SpO2 of 89% or greater (currently 3 L at time of exam), IV Lasix  40 mg as hemodynamically stable to optimize patient's respiratory status, follow echocardiogram, treat empirically for potential dress  Gram-negative pneumonia (HCC) Potential gram-negative pneumonia in the setting of recent hospitalization New infiltrates on CT and chest x-ray, febrile with leukocytosis, will treat given the patient's overall clinical status although suspect this is a less likely to be infection and more likely fluid given the patient's procalcitonin of 0.25 and improving WBC trend (19.8 from 25.8 previously) Recent high-dose steroids convoluted with clinical picture No hypotension, lactic acid 1.6, Flu, COVID - Patient received vancomycin  and cefepime  in the emergency department which we will continue pending sputum and blood cultures Patient has indwelling back hardware although says back pain is at baseline and has no focal neurology so not imaging for now Plan: Continue cefepime  and vancomycin  pending blood and sputum culture results and clinical course, Legionella and Mycoplasma testing, MRSA nasal PCR for de-escalation of vancomycin    Nonischemic nontraumatic myocardial injury    Nontraumatic nonischemic myocardial injury Suspect the patient has either acute decompensated heart failure causing myocardial wall strain or myocarditis Differential would be a type II MI however no chest pain patient's BNP is 305 with new respiratory  failure and infiltrates Twelve-lead ECG independently reviewed and interpreted: Ventricular rate of 93 sinus rhythm PR interval 159 QTc 461 has intraventricular conduction delay widened QRS also seen on EKG from 08/30/2023 Troponin from 90-1 14 no chest pain On-call cardiologist consulted by the emergency department and does not believe this is a primary type I MI either ase discussed with cardiologist Agbor-Etang.  We reviewed the pertinent details over the phone including the rising troponins but that the patient does not have any chest pain currently and EKGs demonstrated no ST elevation.  We are both in agreement that this is likely type II ischemia given the fever and evidence of pneumonia and he recommends continuing to trend the troponins but no anticoagulation at this time.  Will call the patient in for admission [HD] Plan:no  ASA due to allergy, echocardiogram for new wall motion abnormality and review for signs of myocarditis volume overload, empiric 40 mg IV Lasix  pending echo results, plan to consult cardiology as interval if any abnormalities revealed on echocardiogram  Adrenal insufficiency (HCC) Adrenal insufficiency Holding the patient's home hydrocortisone  given is on high-dose IV currently   Transaminitis Elevated AST 46 will trend Acute Hep Panel Given <3x upper limit will continue statin  Anaphylaxis Recent anaphylaxis Currently hemodynamically stable without any angioedema of the face or periorbital area or abdominal pain Patient reports that her rash is unchanged as well  DM2 (diabetes mellitus, type 2) (HCC)  Type 2 diabetes mellitus also with anticipated steroid-induced hyperglycemia Sliding scale insulin  while NPO Can start diet tomorrow if no progressive airway involvement   HLD (hyperlipidemia) Hyperlipidemia Continue Crestor  10 mg daily  Essential hypertension  Hypertension Holding patient's home Cozaar  50 mg and amlodipine  2.5 mg until hemodynamic  stability ensured  Mood disorder (HCC)     Mood disorder Currently euthymic  continue amitriptyline  50 mg daily and escitalopram  10 mg Received BPO regarding history of prolonged QT QTc 461 today is chronic medication continue       Advance Care Planning: Full as per patient   Consults: None, pending Echo results  Family Communication: S/T [name] George 12:52 AM 09/05/2023  Severity of Illness: The appropriate patient status for this patient is INPATIENT. Inpatient status is judged to be reasonable and necessary in order to provide the required intensity of service to ensure the patient's safety. The patient's presenting symptoms, physical exam findings, and initial radiographic and laboratory data in the context of their chronic comorbidities is felt to place them at high risk for further clinical deterioration. Furthermore, it is not anticipated that the patient will be medically stable for discharge from the hospital within 2 midnights of admission.   * I certify that at the point of admission it is my clinical judgment that the patient will require inpatient hospital care spanning beyond 2 midnights from the point of admission due to high intensity of service, high risk for further deterioration and high frequency of surveillance required.*  Upon my evaluation, this patient had a high probability of imminent or life-threatening deterioration due to Acute Hypoxic Respiratory Failure and Probable Dress Syndrome, which required my direct attention, intervention, and personal management. I have personally provided 43 minutes of critical care time exclusive of time spent on separately billable procedures. Time includes review of laboratory data, radiology results, discussion with consultants, and monitoring for potential decompensation. Interventions were performed as documented above.   Patient Admitted to Progressive Unit  Lines: PIV only DVT Prophylaxis : Enoxaparin  Analgesia:  PRN  Acetaminophen , Oxycodone    Author: Prentice JAYSON Lowenstein, MD 09/05/2023 12:47 AM  For on call review [email or web address].

## 2023-09-05 ENCOUNTER — Inpatient Hospital Stay
Admit: 2023-09-05 | Discharge: 2023-09-05 | Disposition: A | Discharge status: Home / Self Care | Attending: Internal Medicine | Admitting: Internal Medicine

## 2023-09-05 DIAGNOSIS — J9601 Acute respiratory failure with hypoxia: Secondary | ICD-10-CM

## 2023-09-05 DIAGNOSIS — E2740 Unspecified adrenocortical insufficiency: Secondary | ICD-10-CM | POA: Insufficient documentation

## 2023-09-05 DIAGNOSIS — T50905A Adverse effect of unspecified drugs, medicaments and biological substances, initial encounter: Secondary | ICD-10-CM | POA: Diagnosis not present

## 2023-09-05 DIAGNOSIS — R7401 Elevation of levels of liver transaminase levels: Secondary | ICD-10-CM | POA: Insufficient documentation

## 2023-09-05 DIAGNOSIS — J1569 Pneumonia due to other gram-negative bacteria: Secondary | ICD-10-CM

## 2023-09-05 DIAGNOSIS — D7212 Drug rash with eosinophilia and systemic symptoms syndrome: Secondary | ICD-10-CM | POA: Diagnosis not present

## 2023-09-05 DIAGNOSIS — I5A Non-ischemic myocardial injury (non-traumatic): Secondary | ICD-10-CM

## 2023-09-05 LAB — CBG MONITORING, ED
Glucose-Capillary: 176 mg/dL — ABNORMAL HIGH (ref 70–99)
Glucose-Capillary: 206 mg/dL — ABNORMAL HIGH (ref 70–99)
Glucose-Capillary: 222 mg/dL — ABNORMAL HIGH (ref 70–99)
Glucose-Capillary: 241 mg/dL — ABNORMAL HIGH (ref 70–99)
Glucose-Capillary: 330 mg/dL — ABNORMAL HIGH (ref 70–99)

## 2023-09-05 LAB — ECHOCARDIOGRAM COMPLETE
AR max vel: 2.55 cm2
AV Peak grad: 8.2 mmHg
Ao pk vel: 1.43 m/s
Area-P 1/2: 4.86 cm2
Height: 64 in
S' Lateral: 3.7 cm
Weight: 3712 [oz_av]

## 2023-09-05 LAB — HEPATITIS PANEL, ACUTE
HCV Ab: NONREACTIVE
Hep A IgM: NONREACTIVE
Hep B C IgM: NONREACTIVE
Hepatitis B Surface Ag: NONREACTIVE

## 2023-09-05 LAB — CBC
HCT: 30 % — ABNORMAL LOW (ref 36.0–46.0)
HCT: 31.4 % — ABNORMAL LOW (ref 36.0–46.0)
Hemoglobin: 9.8 g/dL — ABNORMAL LOW (ref 12.0–15.0)
Hemoglobin: 10.4 g/dL — ABNORMAL LOW (ref 12.0–15.0)
MCH: 29.6 pg (ref 26.0–34.0)
MCH: 29.5 pg (ref 26.0–34.0)
MCHC: 32.7 g/dL (ref 30.0–36.0)
MCHC: 33.1 g/dL (ref 30.0–36.0)
MCV: 90.6 fL (ref 80.0–100.0)
MCV: 89 fL (ref 80.0–100.0)
Platelets: 172 K/uL (ref 150–400)
Platelets: 163 K/uL (ref 150–400)
RBC: 3.31 MIL/uL — ABNORMAL LOW (ref 3.87–5.11)
RBC: 3.53 MIL/uL — ABNORMAL LOW (ref 3.87–5.11)
RDW: 14 % (ref 11.5–15.5)
RDW: 13.8 % (ref 11.5–15.5)
WBC: 20.2 K/uL — ABNORMAL HIGH (ref 4.0–10.5)
WBC: 17.1 K/uL — ABNORMAL HIGH (ref 4.0–10.5)
nRBC: 0.2 % (ref 0.0–0.2)
nRBC: 0.2 % (ref 0.0–0.2)

## 2023-09-05 LAB — COMPREHENSIVE METABOLIC PANEL WITH GFR
ALT: 80 U/L — ABNORMAL HIGH (ref 0–44)
AST: 65 U/L — ABNORMAL HIGH (ref 15–41)
Albumin: 2.5 g/dL — ABNORMAL LOW (ref 3.5–5.0)
Alkaline Phosphatase: 85 U/L (ref 38–126)
Anion gap: 10 (ref 5–15)
BUN: 22 mg/dL (ref 8–23)
CO2: 23 mmol/L (ref 22–32)
Calcium: 7.6 mg/dL — ABNORMAL LOW (ref 8.9–10.3)
Chloride: 105 mmol/L (ref 98–111)
Creatinine, Ser: 1.11 mg/dL — ABNORMAL HIGH (ref 0.44–1.00)
GFR, Estimated: 53 mL/min — ABNORMAL LOW (ref >60)
Glucose, Bld: 258 mg/dL — ABNORMAL HIGH (ref 70–99)
Potassium: 3.9 mmol/L (ref 3.5–5.1)
Sodium: 138 mmol/L (ref 135–145)
Total Bilirubin: 1.2 mg/dL (ref 0.0–1.2)
Total Protein: 6.3 g/dL — ABNORMAL LOW (ref 6.5–8.1)

## 2023-09-05 LAB — TRIGLYCERIDES: Triglycerides: 79 mg/dL (ref <150)

## 2023-09-05 LAB — MRSA NEXT GEN BY PCR, NASAL: MRSA by PCR Next Gen: NOT DETECTED

## 2023-09-05 LAB — CREATININE, SERUM
Creatinine, Ser: 0.99 mg/dL (ref 0.44–1.00)
GFR, Estimated: >60 mL/min (ref >60)

## 2023-09-05 LAB — GLUCOSE, CAPILLARY
Glucose-Capillary: 232 mg/dL — ABNORMAL HIGH (ref 70–99)
Glucose-Capillary: 282 mg/dL — ABNORMAL HIGH (ref 70–99)

## 2023-09-05 LAB — LACTATE DEHYDROGENASE: LDH: 432 U/L — ABNORMAL HIGH (ref 98–192)

## 2023-09-05 LAB — TSH: TSH: 0.736 u[IU]/mL (ref 0.350–4.500)

## 2023-09-05 LAB — C-REACTIVE PROTEIN: CRP: 18.8 mg/dL — ABNORMAL HIGH (ref <1.0)

## 2023-09-05 LAB — FERRITIN: Ferritin: 238 ng/mL (ref 11–307)

## 2023-09-05 LAB — TROPONIN I (HIGH SENSITIVITY): Troponin I (High Sensitivity): 84 ng/L — ABNORMAL HIGH (ref <18)

## 2023-09-05 MED ORDER — DIPHENHYDRAMINE HCL 25 MG PO CAPS
25.0000 mg | ORAL_CAPSULE | Freq: Four times a day (QID) | ORAL | Status: DC | PRN
  Administered 2023-09-05 – 2023-09-06 (×2): 25 mg via ORAL
  Filled 2023-09-05 (×2): qty 1

## 2023-09-05 MED ORDER — ESCITALOPRAM OXALATE 10 MG PO TABS
10.0000 mg | ORAL_TABLET | Freq: Every day | ORAL | Status: DC
  Administered 2023-09-05 – 2023-09-07 (×4): 10 mg via ORAL
  Filled 2023-09-05 (×4): qty 1

## 2023-09-05 MED ORDER — OXYCODONE HCL 5 MG PO TABS: 5.0000 mg | ORAL_TABLET | ORAL | Status: DC | PRN

## 2023-09-05 MED ORDER — ACETAMINOPHEN 325 MG PO TABS
650.0000 mg | ORAL_TABLET | Freq: Four times a day (QID) | ORAL | Status: DC | PRN
Start: 2023-09-05 — End: 2023-09-07
  Administered 2023-09-05 – 2023-09-07 (×5): 650 mg via ORAL
  Filled 2023-09-05 (×6): qty 2

## 2023-09-05 MED ORDER — ROSUVASTATIN CALCIUM 10 MG PO TABS
10.0000 mg | ORAL_TABLET | Freq: Every evening | ORAL | Status: DC
  Administered 2023-09-05 – 2023-09-06 (×3): 10 mg via ORAL
  Filled 2023-09-05 (×3): qty 1

## 2023-09-05 MED ORDER — ACETAMINOPHEN 650 MG RE SUPP: 650.0000 mg | Freq: Four times a day (QID) | RECTAL | Status: DC | PRN

## 2023-09-05 MED ORDER — PREGABALIN 75 MG PO CAPS
150.0000 mg | ORAL_CAPSULE | Freq: Two times a day (BID) | ORAL | Status: DC
  Administered 2023-09-05 – 2023-09-07 (×6): 150 mg via ORAL
  Filled 2023-09-05 (×2): qty 2
  Filled 2023-09-05: qty 3
  Filled 2023-09-05: qty 2
  Filled 2023-09-05: qty 3
  Filled 2023-09-05: qty 2

## 2023-09-05 MED ORDER — POLYETHYLENE GLYCOL 3350 17 G PO PACK: 17.0000 g | PACK | Freq: Every day | ORAL | Status: DC | PRN

## 2023-09-05 MED ORDER — AMITRIPTYLINE HCL 50 MG PO TABS
50.0000 mg | ORAL_TABLET | Freq: Every day | ORAL | Status: DC
  Administered 2023-09-05 – 2023-09-06 (×3): 50 mg via ORAL
  Filled 2023-09-05 (×3): qty 1

## 2023-09-05 NOTE — Assessment & Plan Note (Addendum)
  Hypertension Holding patient's home Cozaar  50 mg and amlodipine  2.5 mg until hemodynamic stability ensured

## 2023-09-05 NOTE — ED Notes (Signed)
 Pt transferred to [facility]. Urinated . Pt back in bed at this time.

## 2023-09-05 NOTE — Assessment & Plan Note (Signed)
 Potential gram-negative pneumonia in the setting of recent hospitalization New infiltrates on CT and chest x-ray, febrile with leukocytosis, will treat given the patient's overall clinical status although suspect this is a less likely to be infection and more likely fluid given the patient's procalcitonin of 0.25 and improving WBC trend (19.8 from 25.8 previously) Recent high-dose steroids convoluted with clinical picture No hypotension, lactic acid 1.6, Flu, COVID - Patient received vancomycin  and cefepime  in the emergency department which we will continue pending sputum and blood cultures Patient has indwelling back hardware although says back pain is at baseline and has no focal neurology so not imaging for now Plan: Continue cefepime  and vancomycin  pending blood and sputum culture results and clinical course, Legionella and Mycoplasma testing, MRSA nasal PCR for de-escalation of vancomycin 

## 2023-09-05 NOTE — Assessment & Plan Note (Signed)
 Recent anaphylaxis Currently hemodynamically stable without any angioedema of the face or periorbital area or abdominal pain Patient reports that her rash is unchanged as well

## 2023-09-05 NOTE — Assessment & Plan Note (Signed)
  Type 2 diabetes mellitus also with anticipated steroid-induced hyperglycemia Sliding scale insulin  while NPO Can start diet tomorrow if no progressive airway involvement

## 2023-09-05 NOTE — Assessment & Plan Note (Signed)
 Hyperlipidemia Continue Crestor  10 mg daily

## 2023-09-05 NOTE — Assessment & Plan Note (Signed)
 Acute hypoxic respiratory failure CT with new bilateral effusions, edema versus pneumonia, suspect primarily a volume overload or Dress-induced interstitial pneumonitis Differential would also be potentially infection (Procal Negative, BNP up, WBC trending down) Dyspnea and desaturates less than 89% off of oxygen Plan: Continuous pulse oximetry, supplemental oxygen titrated to SpO2 of 89% or greater (currently 3 L at time of exam), IV Lasix  40 mg as hemodynamically stable to optimize patient's respiratory status, follow echocardiogram, treat empirically for potential dress

## 2023-09-05 NOTE — Assessment & Plan Note (Signed)
    Nontraumatic nonischemic myocardial injury Suspect the patient has either acute decompensated heart failure causing myocardial wall strain or myocarditis Differential would be a type II MI however no chest pain patient's BNP is 305 with new respiratory failure and infiltrates Twelve-lead ECG independently reviewed and interpreted: Ventricular rate of 93 sinus rhythm PR interval 159 QTc 461 has intraventricular conduction delay widened QRS also seen on EKG from 08/30/2023 Troponin from 90-1 14 no chest pain On-call cardiologist consulted by the emergency department and does not believe this is a primary type I MI either ase discussed with cardiologist Agbor-Etang.  We reviewed the pertinent details over the phone including the rising troponins but that the patient does not have any chest pain currently and EKGs demonstrated no ST elevation.  We are both in agreement that this is likely type II ischemia given the fever and evidence of pneumonia and he recommends continuing to trend the troponins but no anticoagulation at this time.  Will call the patient in for admission [HD] Plan:no  ASA due to allergy, echocardiogram for new wall motion abnormality and review for signs of myocarditis volume overload, empiric 40 mg IV Lasix  pending echo results, plan to consult cardiology as interval if any abnormalities revealed on echocardiogram

## 2023-09-05 NOTE — Assessment & Plan Note (Signed)
 Elevated AST 46 will trend Acute Hep Panel

## 2023-09-05 NOTE — ED Notes (Signed)
 Daughter at bedside.

## 2023-09-05 NOTE — Assessment & Plan Note (Signed)
 Adrenal insufficiency Holding the patient's home hydrocortisone  given is on high-dose IV currently

## 2023-09-05 NOTE — ED Notes (Signed)
 Paged Barbarann, MD in reference to unanswered secure chats.

## 2023-09-05 NOTE — Progress Notes (Signed)
 Progress Note   Patient: Sarah [name] FMW:[id number] DOB: [birth year] DOA: 09/04/2023     1 DOS: the patient was seen and examined on 09/05/2023   Brief hospital course: 73yo with h/o T2DM with neuropathy, depression, and malignant melanoma who presented on 8/16 with respiratory distress.  She was previously hospitalized from 8/11-15 with anaphylactic shock following recent clindamycin following a root canal, requiring ICU admission and treatment with pressors and corticosteroids.  Rash persisted but hypotension resolved.  Her rash worsened and EMS found her O2 sats in the 70s and started 6L [place] O2.  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  Echo with grade 2 diastolic dysfunction.  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 Patient has fever, previous facial edema, lymphadenopathy, rash and organ involvement Patient has a RegiSCAR for Dress of 3 (Possible) given enlarged lymph node seen on CT with atypical lymphocytes, eosinophilia, and extensive skin rash with edema and purpura Patient has eosinophils of 1.2K per UL, immature granulocytes and reactive benign lymphocytes and left shift with Polychromasia  Checking LDH (elevated), ferritin (normal), CRP (elevated) US  with hepatomegaly, fatty liver, no splenomegaly Echo reassuring No evidence of any CNS involvement No necrosis to think TENS or SJS although she is having some sloughing of the rash on her chest today Will treat with methylprednisolone  250mg  BID (lower than standard  dose of 30 mg/kg for 3 days given clinical stability but still concern cardiac or lung involvement)  If clinical course were to worsen, ICU consultation    Acute hypoxic respiratory failure  Acute hypoxic respiratory failure, O2 levels in 70s with only mild cough and no SOB or other respiratory symptoms CT with new bilateral effusions, edema versus pneumonia, suspect primarily a volume overload or Dress-induced interstitial  pneumonitis Continuous pulse oximetry with supplemental oxygen titrated to SpO2 of 89% or greater (currently 3 L at time of exam) IV Lasix  40 mg x 1 Follow echocardiogram Treat empirically for potential dress   Possible pneumonia  Recent hospitalization New infiltrates on CT and chest x-ray Febrile to 101.3 with leukocytosis to 17.1 on presentation Procalcitonin of 0.25 and improving WBC trend (19.8 from 25.8 previously) Recent high-dose steroids complicate clinical picture No current concern for sepsis Continue vancomycin  and cefepime   Patient has indwelling back hardware although says back pain is at baseline and has no focal neurology so not imaging for now Blood and sputum cultures pending  MRSA PCR is negative so will dc Vanc for now Consider ID consult in AM    Nonischemic nontraumatic myocardial injury Nontraumatic nonischemic myocardial injury Troponin 90, 114 Echo with preserved EF, grade 2 DD Cardiology consulted by telephone, low suspicion for ACS No ASA due to allergy Given IV Lasix  Will trend troponin   Adrenal insufficiency  Holding the patient's home hydrocortisone  given she is on high-dose IV steroids currently   Transaminitis Elevated LFTs, slightly worse this AM Acute Hep Panel negative RUQ with fatty liver Given <3x upper limit, will continue statin   Anaphylaxis Recent anaphylaxis Currently hemodynamically stable without any angioedema of the face or periorbital area or abdominal pain Patient reports that her rash is unchanged as well Starting to have some sloughing of chest rash only, likely associated with prior allergic response   DM2 (diabetes mellitus, type 2)  A1c 6.9, good control Hold Glucophage  Cover with moderate-scale SSI Carb modified diet    HLD (hyperlipidemia) Continue Crestor    Stage 3a CKD Appears to be stable at this time Attempt to avoid  nephrotoxic medications Recheck BMP in AM    Essential hypertension Holding  patient's home Cozaar  and amlodipine  until hemodynamic stability ensured   Mood disorder (HCC) Continue amitriptyline  and escitalopram     Class 2 obesity Body mass index is 39.82 kg/m.SABRA  Weight loss should be encouraged Outpatient PCP/bariatric medicine f/u encouraged Significantly low or high BMI is associated with higher medical risk including morbidity and mortality       Consultants: None  Procedures: Echocardiogram 8/16  Antibiotics: Cefepime  8/16- Vancomcyin x 1  30 Day Unplanned Readmission Risk Score    Flowsheet Row ED to Hosp-Admission (Current) from 09/04/2023 in [facility] Emergency Department at [facility]  30 Day Unplanned Readmission Risk Score (%) 15.15 Filed at 09/05/2023 0801    This score is the patient's risk of an unplanned readmission within 30 days of being discharged (0 -100%). The score is based on dignosis, age, lab data, medications, orders, and past utilization.   Low:  0-14.9   Medium: 15-21.9   High: 22-29.9   Extreme: 30 and above           Subjective: Feeling ok.  No respiratory symptoms.  Started having some sloughing of her rash on her chest today.  Reported mild pain below L breast sometime later in the AM to her nurse.  Itching.  Physical Exam: Vitals:   09/05/23 0702 09/05/23 0730 09/05/23 0740 09/05/23 0900  BP:  (!) 92/58 (!) 108/54   Pulse:  74  64  Resp:  (!) 23  19  Temp: 98 F (36.7 C)     TempSrc: Oral     SpO2:  96%  98%  Weight:      Height:          Intake/Output Summary (Last 24 hours) at 09/05/2023 0912 Last data filed at 09/05/2023 0836 Gross per 24 hour  Intake 99.44 ml  Output 800 ml  Net -700.56 ml   Filed Weights   09/04/23 1743  Weight: 105.2 kg    Exam:  General:  Appears calm and comfortable and is in NAD Eyes:   normal lids, iris ENT:  grossly normal hearing, lips & tongue, mmm Cardiovascular:  RRR. No LE edema.  Respiratory:   CTA bilaterally with no wheezes/rales/rhonchi.   Normal respiratory effort. Abdomen:  soft, NT, ND Skin:  diffuse erythematous sandpaper rash or arms > legs > upper chest; spares face, abdomen Musculoskeletal:  grossly normal tone BUE/BLE, good ROM, no bony abnormality Psychiatric:  blunted mood and affect, speech fluent and appropriate, AOx3 Neurologic:  CN 2-12 grossly intact, moves all extremities in coordinated fashion  Data Reviewed: I have reviewed the patient's lab results since admission.  Pertinent labs for today include:   Glucose 258 BUN 22/Creatinine 1.11/GFR 53 Albumin 2.5 AST 65/ALT 80/Bili 1.2 WBC 17.1 Hgb 10.4 TSH 0.736    Family Communication: None present  Disposition: Status is: Inpatient Remains inpatient appropriate because: ongoing mangement  Planned Discharge Destination: Home    Time spent: 50 minutes  Author: Delon Herald, MD 09/05/2023 9:11 AM  For on call review [email or web address].

## 2023-09-05 NOTE — ED Notes (Signed)
 Pt reports pain under left breast. Reproducible with palpation. Denies SHOB. Messaged MD via secure chat to notify.

## 2023-09-05 NOTE — Assessment & Plan Note (Signed)
     Mood disorder Currently euthymic continue amitriptyline  50 mg daily and escitalopram  10 mg Received BPO regarding history of prolonged QT QTc 461 today is chronic medication continue

## 2023-09-05 NOTE — ED Notes (Signed)
 Called to notify floor of pt's soon arrival

## 2023-09-05 NOTE — Assessment & Plan Note (Signed)
 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 Patient has fever, previous facial edema, lymphadenopathy, rash and organ involvement Patient has a RegiSCAR for Dress of 3 (Possible) given enlarged lymph node seen on CT the atypical lymphocytes the eosinophilia the extensive skin rash extend and her edema and purpura Patient has eosinophils of 1.2K per UL, immature granulocytes and reactive benign lymphocytes and left shift with Polychromasia  Checking LDH, ferritin, ultrasound abdomen for hepatosplenomegaly or more nodes, triglycerides, echo for any cardiac involvement, monitoring for potential lung involvement no evidence of any CNS involvement, no necrosis to think TENS or SJS Plan: methylprednisolone  250mg  BID (lower than standard  dose of 30 mg/kg for 3 day given clinical stability but still concern cardiac or lung involvement) and if clinical course were to worsen ICU consultation keepin view other immunosuppressive, TSH, ANA and hepatitis serologies

## 2023-09-06 DIAGNOSIS — T50905A Adverse effect of unspecified drugs, medicaments and biological substances, initial encounter: Secondary | ICD-10-CM | POA: Diagnosis not present

## 2023-09-06 DIAGNOSIS — D7212 Drug rash with eosinophilia and systemic symptoms syndrome: Secondary | ICD-10-CM | POA: Diagnosis not present

## 2023-09-06 LAB — BASIC METABOLIC PANEL WITH GFR
Anion gap: 12 (ref 5–15)
BUN: 28 mg/dL — ABNORMAL HIGH (ref 8–23)
CO2: 23 mmol/L (ref 22–32)
Calcium: 7.8 mg/dL — ABNORMAL LOW (ref 8.9–10.3)
Chloride: 106 mmol/L (ref 98–111)
Creatinine, Ser: 0.97 mg/dL (ref 0.44–1.00)
GFR, Estimated: >60 mL/min (ref >60)
Glucose, Bld: 209 mg/dL — ABNORMAL HIGH (ref 70–99)
Potassium: 3.9 mmol/L (ref 3.5–5.1)
Sodium: 141 mmol/L (ref 135–145)

## 2023-09-06 LAB — CBC WITH DIFFERENTIAL/PLATELET
Abs Immature Granulocytes: 1.47 K/uL — ABNORMAL HIGH (ref 0.00–0.07)
Basophils Absolute: 0.1 K/uL (ref 0.0–0.1)
Basophils Relative: 0 %
Eosinophils Absolute: 0 K/uL (ref 0.0–0.5)
Eosinophils Relative: 0 %
HCT: 29.8 % — ABNORMAL LOW (ref 36.0–46.0)
Hemoglobin: 9.8 g/dL — ABNORMAL LOW (ref 12.0–15.0)
Immature Granulocytes: 11 %
Lymphocytes Relative: 10 %
Lymphs Abs: 1.4 K/uL (ref 0.7–4.0)
MCH: 29.1 pg (ref 26.0–34.0)
MCHC: 32.9 g/dL (ref 30.0–36.0)
MCV: 88.4 fL (ref 80.0–100.0)
Monocytes Absolute: 0.4 K/uL (ref 0.1–1.0)
Monocytes Relative: 3 %
Neutro Abs: 10.3 K/uL — ABNORMAL HIGH (ref 1.7–7.7)
Neutrophils Relative %: 76 %
Platelets: 183 K/uL (ref 150–400)
RBC: 3.37 MIL/uL — ABNORMAL LOW (ref 3.87–5.11)
RDW: 13.4 % (ref 11.5–15.5)
Smear Review: NORMAL
WBC: 13.7 K/uL — ABNORMAL HIGH (ref 4.0–10.5)
nRBC: 0.1 % (ref 0.0–0.2)

## 2023-09-06 LAB — GLUCOSE, CAPILLARY
Glucose-Capillary: 204 mg/dL — ABNORMAL HIGH (ref 70–99)
Glucose-Capillary: 175 mg/dL — ABNORMAL HIGH (ref 70–99)
Glucose-Capillary: 251 mg/dL — ABNORMAL HIGH (ref 70–99)
Glucose-Capillary: 236 mg/dL — ABNORMAL HIGH (ref 70–99)
Glucose-Capillary: 239 mg/dL — ABNORMAL HIGH (ref 70–99)
Glucose-Capillary: 221 mg/dL — ABNORMAL HIGH (ref 70–99)

## 2023-09-06 MED ORDER — PREDNISONE 10 MG PO TABS
10.0000 mg | ORAL_TABLET | Freq: Every day | ORAL | Status: DC
  Administered 2023-09-07: 10 mg via ORAL
  Filled 2023-09-06: qty 1

## 2023-09-06 NOTE — Plan of Care (Signed)

## 2023-09-06 NOTE — Plan of Care (Signed)

## 2023-09-06 NOTE — Evaluation (Signed)
 Occupational Therapy Evaluation Patient Details Name: Sarah [name] MRN: [id number] DOB: Jan 12, 1951 Today's Date: 09/06/2023   History of Present Illness   73 year old female with past medical history of type 2 diabetes mellitus, neuropathy, depression, prolonged QT syndrome, malignant melanoma and vitamin D  deficiency recently discharged from the hospital on September 03, 2023 following ICU admission for anaphylaxis thought to be due to clindamycin in the setting of root canal treatment.  The patient required vasopressors and was noted to have respiratory failure and widespread rash.     Clinical Impressions Patient presenting with decreased Ind in self care,balance, functional mobility/transfers, endurance, and safety awareness. Patient reports being Ind at baseline and living at the [facility] [place]. Pt was in bathroom seated on BSC with NT present assisting with bathing. Pt needing assistance to don hospital gown and reports feeling fatigued from bathing tasks. Pt stand with CGA. Pt ambulates with supervision and uses of RW to conserve energy conservation 150' without LOB. Pt returning back to room and back to bed secondary to fatigue. Call bell and all needed items within reach upon exiting the room.  Patient will benefit from acute OT to increase overall independence in the areas of ADLs, functional mobility, and safety awareness in order to safely discharge.     If plan is discharge home, recommend the following:   Assistance with cooking/housework     Functional Status Assessment   Patient has not had a recent decline in their functional status     Equipment Recommendations   None recommended by OT      Precautions/Restrictions   Precautions Precautions: Fall     Mobility Bed Mobility Overal bed mobility: Needs Assistance Bed Mobility: Sit to Supine       Sit to supine: Contact guard assist        Transfers Overall transfer level: Needs  assistance Equipment used: Rolling walker (2 wheels) Transfers: Sit to/from Stand Sit to Stand: Supervision                  Balance Overall balance assessment: Needs assistance Sitting-balance support: No upper extremity supported Sitting balance-Leahy Scale: Normal     Standing balance support: Bilateral upper extremity supported, During functional activity, Reliant on assistive device for balance Standing balance-Leahy Scale: Good                             ADL either performed or assessed with clinical judgement   ADL Overall ADL's : Needs assistance/impaired                 Upper Body Dressing : Minimal assistance;Sitting Upper Body Dressing Details (indicate cue type and reason): to don hospital gown                         Vision Patient Visual Report: No change from baseline              Pertinent Vitals/Pain Pain Assessment Pain Assessment: Faces Faces Pain Scale: No hurt     Extremity/Trunk Assessment Upper Extremity Assessment Upper Extremity Assessment: Overall WFL for tasks assessed;Generalized weakness   Lower Extremity Assessment Lower Extremity Assessment: Overall WFL for tasks assessed;Generalized weakness   Cervical / Trunk Assessment Cervical / Trunk Assessment: Normal   Communication Communication Communication: No apparent difficulties   Cognition Arousal: Alert Behavior During Therapy: WFL for tasks assessed/performed  OT - Cognition Comments: A/Ox4                 Following commands: Intact       Cueing  General Comments   Cueing Techniques: Verbal cues              Home Living Family/patient expects to be discharged to:: Assisted living Living Arrangements: Alone Available Help at Discharge: Available PRN/intermittently (housekeeper monthly and daugther intermittently stops in) Type of Home: Other(Comment) ([facility]) Home Access: Level entry      Home Layout: One level     Bathroom Shower/Tub: Runner, broadcasting/film/video: Shower seat - built in;Grab bars - toilet;Grab bars - tub/shower;Hand held shower head          Prior Functioning/Environment Prior Level of Function : Independent/Modified Independent             Mobility Comments: reports driving and out in the community multiple times/wk, does her own shopping, medication management, errands, etc ADLs Comments: independent, apart from housekeeper monthly    OT Problem List: Decreased strength;Decreased range of motion;Decreased activity tolerance;Impaired balance (sitting and/or standing);Decreased safety awareness   OT Treatment/Interventions: Self-care/ADL training;Therapeutic exercise;Patient/family education;Balance training;Energy conservation;Therapeutic activities      OT Goals(Current goals can be found in the care plan section)   Acute Rehab OT Goals Patient Stated Goal: to go home and increase strength OT Goal Formulation: With patient/family Time For Goal Achievement: 09/20/23 Potential to Achieve Goals: Good ADL Goals Pt Will Perform Grooming: Independently;standing Pt Will Perform Lower Body Dressing: with modified independence;sit to/from stand Pt Will Transfer to Toilet: with modified independence;ambulating Pt Will Perform Toileting - Clothing Manipulation and hygiene: with modified independence;sit to/from stand   OT Frequency:  Min 2X/week       AM-PAC OT 6 Clicks Daily Activity     Outcome Measure Help from another person eating meals?: None Help from another person taking care of personal grooming?: None Help from another person toileting, which includes using toliet, bedpan, or urinal?: None Help from another person bathing (including washing, rinsing, drying)?: None Help from another person to put on and taking off regular upper body clothing?: None Help from another person to put on and taking off regular lower  body clothing?: A Little 6 Click Score: 23   End of Session Equipment Utilized During Treatment: Rolling walker (2 wheels) Nurse Communication: Mobility status  Activity Tolerance: Patient tolerated treatment well Patient left: in chair;with call bell/phone within reach  OT Visit Diagnosis: Unsteadiness on feet (R26.81)                Time: 8478-8450 OT Time Calculation (min): 28 min Charges:  OT General Charges $OT Visit: 1 Visit OT Evaluation $OT Eval Low Complexity: 1 Low OT Treatments $Self Care/Home Management : 8-22 mins  Sarah Claude, MS, OTR/L , CBIS ascom [phone]  09/06/23, 4:05 PM

## 2023-09-06 NOTE — Hospital Course (Signed)
 73yo with h/o T2DM with neuropathy, depression, and malignant melanoma who presented on 8/16 with respiratory distress.  She was previously hospitalized from 8/11-15 with anaphylactic shock following recent clindamycin following a root canal, requiring ICU admission and treatment with pressors and corticosteroids.  Rash persisted but hypotension resolved.  Her rash worsened and EMS found her O2 sats in the 70s and started 6L [place] O2.  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  Echo with grade 2 diastolic dysfunction.

## 2023-09-06 NOTE — Care Management Important Message (Signed)
 Important Message  Patient Details  Name: Sarah [name] MRN: [id number] Date of Birth: 01-30-1950   Important Message Given:  Yes - Medicare IM     Sarah [name] 09/06/2023, 2:10 PM

## 2023-09-06 NOTE — Progress Notes (Signed)
 Progress Note   Patient: Sarah [name] FMW:[id number] DOB: [birth year] DOA: 09/04/2023     2 DOS: the patient was seen and examined on 09/06/2023   Brief hospital course: 73yo with h/o T2DM with neuropathy, depression, and malignant melanoma who presented on 8/16 with respiratory distress.  She was previously hospitalized from 8/11-15 with anaphylactic shock following recent clindamycin following a root canal, requiring ICU admission and treatment with pressors and corticosteroids.  Rash persisted but hypotension resolved.  Her rash worsened and EMS found her O2 sats in the 70s and started 6L [place] O2.  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  Echo with grade 2 diastolic dysfunction.   Assessment and Plan:    Anaphylaxis Recent anaphylaxis Currently hemodynamically stable without any angioedema of the face or periorbital area or abdominal pain Rash is starting to improve Starting to have some sloughing of thorax, likely associated with prior allergic response Concern for dress syndrome with fever, previous facial edema, lymphadenopathy, rash and organ involvement Patient has a RegiSCAR for Dress of 3 (Possible) given enlarged lymph node seen on CT with atypical lymphocytes, eosinophilia, and extensive skin rash with edema and purpura Checking LDH (elevated), ferritin (normal), CRP (elevated) US  with hepatomegaly, fatty liver, no splenomegaly Echo reassuring No evidence of any CNS involvement No necrosis to think TENS or SJS although she is having some sloughing of the rash on her chest today Treated with methylprednisolone  250mg  BID  Transition to prednisone  taper 60 x 2 days and then down by 10 mg q2days until off Urgent f/u with allergy [facility])   Acute hypoxic respiratory failure  Acute hypoxic respiratory failure, O2 levels in 70s with only mild cough and no SOB or other respiratory symptoms CT with new bilateral effusions, edema versus pneumonia, suspect primarily a volume  overload or Dress-induced interstitial pneumonitis IV Lasix  40 mg x 1 Echocardiogram with preserved EF and grade 2 DD Weaned to room air   Possible pneumonia  Recent hospitalization New infiltrates on CT and chest x-ray Febrile to 101.3 with leukocytosis to 17.1 on presentation Procalcitonin of 0.25 and improving WBC trend (19.8 from 25.8 previously) Recent high-dose steroids complicate clinical picture No current concern for sepsis Continue cefepime   Patient has indwelling back hardware although says back pain is at baseline and has no focal neurology so not imaging for now Blood and sputum cultures pending  MRSA PCR is negative so will dc Vanc for now Appears likely to be reactive rather than infectious, but she should likely    Nonischemic nontraumatic myocardial injury Nontraumatic nonischemic myocardial injury Troponin 90, 114 Echo with preserved EF, grade 2 DD Cardiology consulted by telephone, low suspicion for ACS No ASA due to allergy Given IV Lasix  Will trend troponin   Adrenal insufficiency  Holding the patient's home hydrocortisone  given she is on high-dose IV steroids currently   Transaminitis Elevated LFTs, slightly worse this AM Acute Hep Panel negative RUQ with fatty liver Given <3x upper limit, will continue statin   DM2 (diabetes mellitus, type 2)  A1c 6.9, good control Hold Glucophage  Cover with moderate-scale SSI Carb modified diet    HLD (hyperlipidemia) Continue Crestor     Stage 3a CKD Appears to be stable at this time Attempt to avoid nephrotoxic medications Recheck BMP in AM    Essential hypertension Holding patient's home Cozaar  and amlodipine  until hemodynamic stability ensured   Mood disorder (HCC) Continue amitriptyline  and escitalopram     Class 2 obesity Body mass index is 39.82 kg/m.SABRA  Weight  loss should be encouraged Outpatient PCP/bariatric medicine f/u encouraged Significantly low or high BMI is associated with higher  medical risk including morbidity and mortality    Generalized weakness Reported weakness this AM at time of evaluation She was standing independently at the sink cleaning herself up PT/OT ordered She is recommended for outpatient PT/OT Anticipate dc tomorrow        Consultants: PT OT   Procedures: Echocardiogram 8/16   Antibiotics: Cefepime  8/16- Vancomcyin x 1   30 Day Unplanned Readmission Risk Score    Flowsheet Row ED to Hosp-Admission (Current) from 09/04/2023 in [facility]  30 Day Unplanned Readmission Risk Score (%) 16.33 Filed at 09/06/2023 1600    This score is the patient's risk of an unplanned readmission within 30 days of being discharged (0 -100%). The score is based on dignosis, age, lab data, medications, orders, and past utilization.   Low:  0-14.9   Medium: 15-21.9   High: 22-29.9   Extreme: 30 and above           Subjective: Standing at sink and cleaning herself up.  Reports some generalized weakness.  Not sure she feels well enough to go home but also without specific complaints.   Objective: Vitals:   09/06/23 1328 09/06/23 1611  BP: (!) 150/67 (!) 155/69  Pulse: 67 64  Resp:  18  Temp: 97.7 F (36.5 C) 98.2 F (36.8 C)  SpO2: 96% 94%    Intake/Output Summary (Last 24 hours) at 09/06/2023 1627 Last data filed at 09/06/2023 1420 Gross per 24 hour  Intake 542.19 ml  Output --  Net 542.19 ml   Filed Weights   09/04/23 1743  Weight: 105.2 kg    Exam:  General:  Appears calm and comfortable and is in NAD, on RA Eyes:   normal lids, iris ENT:  grossly normal hearing, lips & tongue, mmm; small improving petechial lesions and a few tiny ulcerations on her hard palate Cardiovascular:  RRR. No LE edema.  Respiratory:   CTA bilaterally with no wheezes/rales/rhonchi.  Normal respiratory effort. Abdomen:  soft, NT, ND Skin:  improving sandpaper rash or arms, generally resolved elsewhere with mild sloughing of skin on  back and chest Musculoskeletal:  grossly normal tone BUE/BLE, good ROM, no bony abnormality Psychiatric:  blunted mood and affect, speech fluent and appropriate, AOx3 Neurologic:  CN 2-12 grossly intact, moves all extremities in coordinated fashion  Data Reviewed: I have reviewed the patient's lab results since admission.  Pertinent labs for today include:   Glucose 209 WBC 13.7, improving Hgb 9.8     Family Communication: None present  Disposition: Status is: Inpatient Remains inpatient appropriate because: ongoing management     Time spent: 50 minutes  Unresulted Labs (From admission, onward)     Start     Ordered   09/07/23 0500  Procalcitonin  Tomorrow morning,   R       References:    Procalcitonin Lower Respiratory Tract Infection AND Sepsis Procalcitonin Algorithm  Question:  Specimen collection method  Answer:  Lab=Lab collect   09/06/23 1627   09/07/23 0500  CBC with Differential/Platelet  Tomorrow morning,   R       Question:  Specimen collection method  Answer:  Lab=Lab collect   09/06/23 1627   09/07/23 0500  Basic metabolic panel with GFR  Tomorrow morning,   R       Question:  Specimen collection method  Answer:  Lab=Lab collect  09/06/23 1627   09/05/23 0025  Mycoplasma pneumoniae antibody, IgM  Once,   R        09/05/23 0025   09/05/23 0025  ANA w/Reflex  Once,   R        09/05/23 0025   09/04/23 2242  Legionella Pneumophila Serogp 1 Ur Ag  Once,   R        09/04/23 2241   09/04/23 2212  Expectorated Sputum Assessment w Gram Stain, Rflx to Resp Cult  Once,   URGENT        09/04/23 2212             Author: Delon Herald, MD 09/06/2023 4:27 PM  For on call review [email or web address].

## 2023-09-07 DIAGNOSIS — D7212 Drug rash with eosinophilia and systemic symptoms syndrome: Secondary | ICD-10-CM | POA: Diagnosis not present

## 2023-09-07 DIAGNOSIS — T50905A Adverse effect of unspecified drugs, medicaments and biological substances, initial encounter: Secondary | ICD-10-CM | POA: Diagnosis not present

## 2023-09-07 LAB — CBC WITH DIFFERENTIAL/PLATELET
Abs Granulocyte: 8.5 K/uL — ABNORMAL HIGH (ref 1.5–6.5)
Abs Immature Granulocytes: 1.05 K/uL — ABNORMAL HIGH (ref 0.00–0.07)
Basophils Absolute: 0 K/uL (ref 0.0–0.1)
Basophils Relative: 0 %
Eosinophils Absolute: 0 K/uL (ref 0.0–0.5)
Eosinophils Relative: 0 %
HCT: 29.7 % — ABNORMAL LOW (ref 36.0–46.0)
Hemoglobin: 9.6 g/dL — ABNORMAL LOW (ref 12.0–15.0)
Immature Granulocytes: 9 %
Lymphocytes Relative: 11 %
Lymphs Abs: 1.3 K/uL (ref 0.7–4.0)
MCH: 28.5 pg (ref 26.0–34.0)
MCHC: 32.3 g/dL (ref 30.0–36.0)
MCV: 88.1 fL (ref 80.0–100.0)
Monocytes Absolute: 0.5 K/uL (ref 0.1–1.0)
Monocytes Relative: 4 %
Neutro Abs: 8.5 K/uL — ABNORMAL HIGH (ref 1.7–7.7)
Neutrophils Relative %: 76 %
Platelets: 184 K/uL (ref 150–400)
RBC: 3.37 MIL/uL — ABNORMAL LOW (ref 3.87–5.11)
RDW: 13.5 % (ref 11.5–15.5)
Smear Review: NORMAL
WBC: 11.4 K/uL — ABNORMAL HIGH (ref 4.0–10.5)
nRBC: 0.2 % (ref 0.0–0.2)

## 2023-09-07 LAB — BASIC METABOLIC PANEL WITH GFR
Anion gap: 10 (ref 5–15)
BUN: 37 mg/dL — ABNORMAL HIGH (ref 8–23)
CO2: 24 mmol/L (ref 22–32)
Calcium: 8 mg/dL — ABNORMAL LOW (ref 8.9–10.3)
Chloride: 107 mmol/L (ref 98–111)
Creatinine, Ser: 0.99 mg/dL (ref 0.44–1.00)
GFR, Estimated: >60 mL/min (ref >60)
Glucose, Bld: 176 mg/dL — ABNORMAL HIGH (ref 70–99)
Potassium: 3.8 mmol/L (ref 3.5–5.1)
Sodium: 141 mmol/L (ref 135–145)

## 2023-09-07 LAB — GLUCOSE, CAPILLARY
Glucose-Capillary: 164 mg/dL — ABNORMAL HIGH (ref 70–99)
Glucose-Capillary: 166 mg/dL — ABNORMAL HIGH (ref 70–99)
Glucose-Capillary: 199 mg/dL — ABNORMAL HIGH (ref 70–99)
Glucose-Capillary: 203 mg/dL — ABNORMAL HIGH (ref 70–99)

## 2023-09-07 LAB — PROCALCITONIN: Procalcitonin: 0.13 ng/mL

## 2023-09-07 LAB — MYCOPLASMA PNEUMONIAE ANTIBODY, IGM: Mycoplasma pneumo IgM: <770 U/mL (ref 0–769)

## 2023-09-07 LAB — ANA W/REFLEX: Anti Nuclear Antibody (ANA): NEGATIVE

## 2023-09-07 MED ORDER — PREDNISONE 10 MG PO TABS: ORAL_TABLET | ORAL | 0 refills | Status: DC

## 2023-09-07 MED ORDER — DIPHENHYDRAMINE HCL 25 MG PO CAPS: 25.0000 mg | ORAL_CAPSULE | Freq: Four times a day (QID) | ORAL | 0 refills | Status: DC | PRN

## 2023-09-07 NOTE — TOC Transition Note (Signed)
 Transition of Care [facility]) - Discharge Note   Patient Details  Name: Sarah [name] MRN: [id number] Date of Birth: January 22, 1950  Transition of Care [facility] CM/SW Contact:  Lauraine JAYSON Carpen, LCSW Phone Number: 09/07/2023, 12:49 PM   Clinical Narrative:  Patient has orders to discharge home today. CSW met with patient. No family at bedside. CSW introduced role and explained that therapy recommendations would be discussed. Patient lives at [facility]. She is agreeable to home health but prefers to use [facility] staff rather than an outside agency. Admissions coordinator is aware. She is agreeable to rollator. Ordered through [employer] to be delivered to the room before she leaves. No further concerns. Her grandson will transport her home. CSW signing off.  Final next level of care: Home w Home Health Services Barriers to Discharge: No Barriers Identified   Patient Goals and CMS Choice     Choice offered to / list presented to : Patient      Discharge Placement                Patient to be transferred to facility by: Grandson   Patient and family notified of of transfer: 09/07/23  Discharge Plan and Services Additional resources added to the After Visit Summary for                  DME Arranged: Walker rolling with seat DME Agency: [employer] Date DME Agency Contacted: 09/07/23   Representative spoke with at DME Agency: Thomasina            Social Drivers of Health (SDOH) Interventions SDOH Screenings   Food Insecurity: No Food Insecurity (09/05/2023)  Housing: Low Risk  (09/05/2023)  Transportation Needs: No Transportation Needs (09/05/2023)  Utilities: Not At Risk (09/05/2023)  Alcohol Screen: Low Risk  (09/29/2022)  Depression (PHQ2-9): Low Risk  (04/15/2023)  Recent Concern: Depression (PHQ2-9) - Medium Risk (04/07/2023)  Financial Resource Strain: Low Risk  (04/22/2023)   Received from [facility]  Physical Activity: Insufficiently Active (09/29/2022)   Social Connections: Moderately Isolated (09/05/2023)  Stress: No Stress Concern Present (09/29/2022)  Tobacco Use: Low Risk  (09/04/2023)  Health Literacy: Adequate Health Literacy (09/29/2022)     Readmission Risk Interventions     No data to display

## 2023-09-07 NOTE — Plan of Care (Signed)
 Problem: Education: Goal: Ability to describe self-care measures that may prevent or decrease complications (Diabetes Survival Skills Education) will improve 09/07/2023 0418 by Alphonzo Sari SAILOR, RN Outcome: Progressing 09/07/2023 0418 by Alphonzo Sari SAILOR, RN Outcome: Progressing Goal: Individualized Educational Video(s) 09/07/2023 0418 by Alphonzo Sari SAILOR, RN Outcome: Progressing 09/07/2023 0418 by Alphonzo Sari SAILOR, RN Outcome: Progressing   Problem: Coping: Goal: Ability to adjust to condition or change in health will improve 09/07/2023 0418 by Alphonzo Sari SAILOR, RN Outcome: Progressing 09/07/2023 0418 by Alphonzo Sari SAILOR, RN Outcome: Progressing   Problem: Fluid Volume: Goal: Ability to maintain a balanced intake and output will improve 09/07/2023 0418 by Alphonzo Sari SAILOR, RN Outcome: Progressing 09/07/2023 0418 by Alphonzo Sari SAILOR, RN Outcome: Progressing   Problem: Health Behavior/Discharge Planning: Goal: Ability to identify and utilize available resources and services will improve 09/07/2023 0418 by Alphonzo Sari SAILOR, RN Outcome: Progressing 09/07/2023 0418 by Alphonzo Sari SAILOR, RN Outcome: Progressing Goal: Ability to manage health-related needs will improve 09/07/2023 0418 by Alphonzo Sari SAILOR, RN Outcome: Progressing 09/07/2023 0418 by Alphonzo Sari SAILOR, RN Outcome: Progressing   Problem: Metabolic: Goal: Ability to maintain appropriate glucose levels will improve 09/07/2023 0418 by Alphonzo Sari SAILOR, RN Outcome: Progressing 09/07/2023 0418 by Alphonzo Sari SAILOR, RN Outcome: Progressing   Problem: Nutritional: Goal: Maintenance of adequate nutrition will improve 09/07/2023 0418 by Alphonzo Sari SAILOR, RN Outcome: Progressing 09/07/2023 0418 by Alphonzo Sari SAILOR, RN Outcome: Progressing Goal: Progress toward achieving an optimal weight will improve 09/07/2023 0418 by Alphonzo Sari SAILOR, RN Outcome: Progressing 09/07/2023 0418 by Alphonzo Sari SAILOR, RN Outcome: Progressing   Problem: Skin  Integrity: Goal: Risk for impaired skin integrity will decrease 09/07/2023 0418 by Alphonzo Sari SAILOR, RN Outcome: Progressing 09/07/2023 0418 by Alphonzo Sari SAILOR, RN Outcome: Progressing   Problem: Tissue Perfusion: Goal: Adequacy of tissue perfusion will improve 09/07/2023 0418 by Alphonzo Sari SAILOR, RN Outcome: Progressing 09/07/2023 0418 by Alphonzo Sari SAILOR, RN Outcome: Progressing   Problem: Education: Goal: Knowledge of General Education information will improve Description: Including pain rating scale, medication(s)/side effects and non-pharmacologic comfort measures 09/07/2023 0418 by Alphonzo Sari SAILOR, RN Outcome: Progressing 09/07/2023 0418 by Alphonzo Sari SAILOR, RN Outcome: Progressing   Problem: Health Behavior/Discharge Planning: Goal: Ability to manage health-related needs will improve 09/07/2023 0418 by Alphonzo Sari SAILOR, RN Outcome: Progressing 09/07/2023 0418 by Alphonzo Sari SAILOR, RN Outcome: Progressing   Problem: Clinical Measurements: Goal: Ability to maintain clinical measurements within normal limits will improve 09/07/2023 0418 by Alphonzo Sari SAILOR, RN Outcome: Progressing 09/07/2023 0418 by Alphonzo Sari SAILOR, RN Outcome: Progressing Goal: Will remain free from infection 09/07/2023 0418 by Alphonzo Sari SAILOR, RN Outcome: Progressing 09/07/2023 0418 by Alphonzo Sari SAILOR, RN Outcome: Progressing Goal: Diagnostic test results will improve 09/07/2023 0418 by Alphonzo Sari SAILOR, RN Outcome: Progressing 09/07/2023 0418 by Alphonzo Sari SAILOR, RN Outcome: Progressing Goal: Respiratory complications will improve 09/07/2023 0418 by Alphonzo Sari SAILOR, RN Outcome: Progressing 09/07/2023 0418 by Alphonzo Sari SAILOR, RN Outcome: Progressing Goal: Cardiovascular complication will be avoided 09/07/2023 0418 by Alphonzo Sari SAILOR, RN Outcome: Progressing 09/07/2023 0418 by Alphonzo Sari SAILOR, RN Outcome: Progressing   Problem: Activity: Goal: Risk for activity intolerance will decrease 09/07/2023 0418 by Alphonzo Sari SAILOR, RN Outcome: Progressing 09/07/2023 0418 by Alphonzo Sari SAILOR, RN Outcome: Progressing   Problem: Nutrition: Goal: Adequate nutrition will be maintained 09/07/2023 0418 by Alphonzo Sari SAILOR, RN Outcome: Progressing 09/07/2023 0418 by Alphonzo Sari SAILOR, RN Outcome: Progressing  Problem: Coping: Goal: Level of anxiety will decrease 09/07/2023 0418 by Alphonzo Sari SAILOR, RN Outcome: Progressing 09/07/2023 0418 by Alphonzo Sari SAILOR, RN Outcome: Progressing   Problem: Elimination: Goal: Will not experience complications related to bowel motility 09/07/2023 0418 by Alphonzo Sari SAILOR, RN Outcome: Progressing 09/07/2023 0418 by Alphonzo Sari SAILOR, RN Outcome: Progressing Goal: Will not experience complications related to urinary retention 09/07/2023 0418 by Alphonzo Sari SAILOR, RN Outcome: Progressing 09/07/2023 0418 by Alphonzo Sari SAILOR, RN Outcome: Progressing   Problem: Pain Managment: Goal: General experience of comfort will improve and/or be controlled 09/07/2023 0418 by Alphonzo Sari SAILOR, RN Outcome: Progressing 09/07/2023 0418 by Alphonzo Sari SAILOR, RN Outcome: Progressing   Problem: Safety: Goal: Ability to remain free from injury will improve 09/07/2023 0418 by Alphonzo Sari SAILOR, RN Outcome: Progressing 09/07/2023 0418 by Alphonzo Sari SAILOR, RN Outcome: Progressing   Problem: Skin Integrity: Goal: Risk for impaired skin integrity will decrease 09/07/2023 0418 by Alphonzo Sari SAILOR, RN Outcome: Progressing 09/07/2023 0418 by Alphonzo Sari SAILOR, RN Outcome: Progressing

## 2023-09-07 NOTE — Evaluation (Signed)
 Physical Therapy Evaluation Patient Details Name: Sarah [name] MRN: [id number] DOB: 1950-12-27 Today's Date: 09/07/2023  History of Present Illness  Pt is a 73 year old female with past medical history of type 2 diabetes mellitus, neuropathy, depression, prolonged QT syndrome, malignant melanoma and vitamin D  deficiency recently discharged from the hospital on September 03, 2023 following ICU admission for anaphylaxis thought to be due to clindamycin in the setting of root canal treatment.  The patient required vasopressors and was noted to have respiratory failure and widespread rash. MD assessment includes: recent anaphylaxis, acute hypoxic respiratory failure, possible pneumonia, and nonischemic nontraumatic myocardial injury.   Clinical Impression  Pt was pleasant and motivated to participate during the session and put forth good effort throughout. Pt Ind with transfers with good eccentric and concentric control.  Pt was able to amb withotu an AD with mildly reduced cadence but steady with no overt LOB; pt fatigued after amb 75 feet and required 10-15 sec therapeutic standing rest break before walking another 75 feet back to room; pt reported Mod SOB during gait that improved quickly upon returning to sitting with SpO2 and HR WNL on room air.  Pt reported no other adverse symptoms other than SOB and her chronic back pain during the session.  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  Pt will benefit from continued PT services upon discharge to safely address deficits listed in patient problem list for decreased caregiver assistance and eventual return to PLOF.          If plan is discharge home, recommend the following: A little help with walking and/or transfers;Assist for transportation   Can travel by private vehicle        Equipment Recommendations Rollator (4 wheels)  Recommendations for Other Services        Functional Status Assessment       Precautions / Restrictions Precautions Precautions: None Restrictions Weight Bearing Restrictions Per Provider Order: No      Mobility  Bed Mobility               General bed mobility comments: NT, in recliner pre-post session    Transfers Overall transfer level: Independent Equipment used: None               General transfer comment: Good eccentric and concentric control and stability    Ambulation/Gait Ambulation/Gait assistance: Supervision Gait Distance (Feet): 150 Feet Assistive device: None Gait Pattern/deviations: Step-through pattern, Decreased step length - right, Decreased step length - left Gait velocity: decreased     General Gait Details: Mildly reduced cadence but steady with no overt LOB; pt fatigued after amb 75 feet and required 10-15 sec therapeutic standing rest break before walking another 75 feet back to room; pt reported Mod SOB that improved quickly upon returning to sitting with SpO2 and HR WNL on room air  Stairs            Wheelchair Mobility     Tilt Bed    Modified Rankin (Stroke Patients Only)       Balance Overall balance assessment: No apparent balance deficits (not formally assessed)   Sitting balance-Leahy Scale: Normal     Standing balance support: No upper extremity supported Standing balance-Leahy Scale: Normal                               Pertinent Vitals/Pain Pain  Assessment Pain Assessment: 0-10 Pain Score: 5  Pain Location: back pain, chronic per pt Pain Descriptors / Indicators: Discomfort, Sore Pain Intervention(s): Monitored during session, Patient requesting pain meds-RN notified    Home Living Family/patient expects to be discharged to:: Private residence Living Arrangements: Alone Available Help at Discharge: Family;Available 24 hours/day Type of Home: Independent living facility Home Access: Level entry       Home Layout: One  level Home Equipment: Shower seat - built in;Grab bars - toilet;Grab bars - tub/shower;Hand held shower head Additional Comments: Dtr will be staying with patient 24/7 x 1 week at discharge and then intermittent assist after that    Prior Function Prior Level of Function : Independent/Modified Independent             Mobility Comments: Ind amb without an AD community distances, no fall history ADLs Comments: Ind with ADLs     Extremity/Trunk Assessment   Upper Extremity Assessment Upper Extremity Assessment: Overall WFL for tasks assessed    Lower Extremity Assessment Lower Extremity Assessment: Generalized weakness       Communication   Communication Communication: No apparent difficulties    Cognition Arousal: Alert Behavior During Therapy: WFL for tasks assessed/performed   PT - Cognitive impairments: No apparent impairments                         Following commands: Intact       Cueing       General Comments      Exercises     Assessment/Plan    PT Assessment Patient needs continued PT services  PT Problem List Decreased activity tolerance       PT Treatment Interventions Gait training;Therapeutic activities;Therapeutic exercise;Patient/family education    PT Goals (Current goals can be found in the Care Plan section)  Acute Rehab PT Goals Patient Stated Goal: To get back home PT Goal Formulation: With patient Time For Goal Achievement: 09/20/23 Potential to Achieve Goals: Good    Frequency Min 1X/week     Co-evaluation               AM-PAC PT 6 Clicks Mobility  Outcome Measure Help needed turning from your back to your side while in a flat bed without using bedrails?: None Help needed moving from lying on your back to sitting on the side of a flat bed without using bedrails?: None Help needed moving to and from a bed to a chair (including a wheelchair)?: None Help needed standing up from a chair using your arms (e.g.,  wheelchair or bedside chair)?: None Help needed to walk in hospital room?: None Help needed climbing 3-5 steps with a railing? : None 6 Click Score: 24    End of Session Equipment Utilized During Treatment: Gait belt Activity Tolerance: Patient tolerated treatment well;Other (comment) (Mod SOB during ambulation) Patient left: in chair;with call bell/phone within reach Nurse Communication: Mobility status;Other (comment) (Pt's reported Mod SOB with ambulation) PT Visit Diagnosis: Difficulty in walking, not elsewhere classified (R26.2)    Time: 8958-8942 PT Time Calculation (min) (ACUTE ONLY): 16 min   Charges:   PT Evaluation $PT Eval Low Complexity: 1 Low   PT General Charges $$ ACUTE PT VISIT: 1 Visit       D. Scott Shaya Reddick PT, DPT 09/07/23, 11:20 AM

## 2023-09-07 NOTE — Discharge Summary (Addendum)
 Physician Discharge Summary   Patient: Sarah [name] MRN: [id number] DOB: 1950-07-12  Admit date:     09/04/2023  Discharge date: 09/07/23  Discharge Physician: Delon Herald   PCP: Valerio Melanie DASEN, NP   Recommendations at discharge:   You are being discharged with home physical and occupational therapy at [facility] Take prednisone  taper as prescribed; resume hydrocortisone  once taper is completed Urgent referral placed to allergist Follow up with NP Cannady in 1-2 weeks  Discharge Diagnoses: Principal Problem:   DRESS syndrome Active Problems:   Acute hypoxic respiratory failure (HCC)   Gram-negative pneumonia (HCC)   Nonischemic nontraumatic myocardial injury   Mood disorder (HCC)   Essential hypertension   HLD (hyperlipidemia)   DM2 (diabetes mellitus, type 2) (HCC)   Anaphylaxis   Transaminitis   Adrenal insufficiency [facility])    Hospital Course: 73yo with h/o T2DM with neuropathy, depression, and malignant melanoma who presented on 8/16 with respiratory distress.  She was previously hospitalized from 8/11-15 with anaphylactic shock following recent clindamycin following a root canal, requiring ICU admission and treatment with pressors and corticosteroids.  Rash persisted but hypotension resolved.  Her rash worsened and EMS found her O2 sats in the 70s and started 6L [place] O2.  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  Echo with grade 2 diastolic dysfunction.   Assessment and Plan:  Anaphylaxis Recent anaphylaxis Currently hemodynamically stable without any angioedema of the face or periorbital area or abdominal pain Rash is starting to improve Starting to have some sloughing of thorax, likely associated with prior allergic response Concern for dress syndrome with fever, previous facial edema, lymphadenopathy, rash and organ involvement Patient has a RegiSCAR for Dress of 3 (Possible) given enlarged lymph node seen on CT with atypical lymphocytes, eosinophilia,  and extensive skin rash with edema and purpura Checking LDH (elevated), ferritin (normal), CRP (elevated) US  with hepatomegaly, fatty liver, no splenomegaly Echo reassuring No evidence of any CNS involvement No necrosis to think TENS or SJS although she is having some sloughing of the rash on her chest today Treated with methylprednisolone  250mg  BID  Transition to prednisone  taper 60 x 2 days and then down by 10 mg q2days until off Urgent f/u with allergy [facility])   Acute hypoxic respiratory failure  Acute hypoxic respiratory failure, O2 levels in 70s with only mild cough and no SOB or other respiratory symptoms CT with new bilateral effusions, edema versus pneumonia, suspect primarily a volume overload or Dress-induced interstitial pneumonitis IV Lasix  40 mg x 1 -> home Lasix  Echocardiogram with preserved EF and grade 2 DD Weaned to room air   Possible pneumonia  Recent hospitalization New infiltrates on CT and chest x-ray Febrile to 101.3 with leukocytosis to 17.1 on presentation Procalcitonin of 0.25 and improving WBC trend (19.8 from 25.8 previously) Recent high-dose steroids complicate clinical picture No current concern for sepsis Continue cefepime   Patient has indwelling back hardware although says back pain is at baseline and has no focal neurology so not imaging for now Blood and sputum cultures pending  MRSA PCR is negative so will dc Vanc for now Appears likely to be reactive rather than infectious, but she should likely    Nonischemic nontraumatic myocardial injury Nontraumatic nonischemic myocardial injury Troponin 90, 114 Echo with preserved EF, grade 2 DD Cardiology consulted by telephone, low suspicion for ACS No ASA due to allergy Given IV Lasix    Adrenal insufficiency  Holding the patient's home hydrocortisone  given she is on steroid taper  Transaminitis Elevated LFTs, slightly worse this AM Acute Hep Panel negative RUQ with fatty liver Given <3x  upper limit, will continue statin   DM2 (diabetes mellitus, type 2)  A1c 6.9, good control Resume Glucophage  Carb modified diet    HLD (hyperlipidemia) Continue Crestor     Stage 3a CKD Appears to be stable at this time Attempt to avoid nephrotoxic medications   Essential hypertension Resume Cozaar  and amlodipine     Mood disorder (HCC) Continue amitriptyline  and escitalopram     Class 2 obesity Body mass index is 39.82 kg/m.SABRA  Weight loss should be encouraged Outpatient PCP/bariatric medicine f/u encouraged Significantly low or high BMI is associated with higher medical risk including morbidity and mortality     Generalized weakness Reported weakness this AM at time of evaluation She was standing independently at the sink cleaning herself up PT/OT ordered She is recommended for outpatient PT/OT at [facility] walker ordered        Consultants: PT OT   Procedures: Echocardiogram 8/16   Antibiotics: Cefepime  8/16- Vancomcyin x 1   Pain control - Jay  Controlled Substance Reporting System database was reviewed. and patient was instructed, not to drive, operate heavy machinery, perform activities at heights, swimming or participation in water activities or provide baby-sitting services while on Pain, Sleep and Anxiety Medications; until their outpatient Physician has advised to do so again. Also recommended to not to take more than prescribed Pain, Sleep and Anxiety Medications.   Disposition: Home Diet recommendation:  Carb modified diet DISCHARGE MEDICATION: Allergies as of 09/07/2023       Reactions   Clindamycin/lincomycin Anaphylaxis   Doxycycline  Rash   Aspirin  Other (See Comments)   Pt is unsure of reaction, but states that she is allergic to aspirin    Ciprofloxacin Rash   very bad rash   Penicillins Rash   Has patient had a PCN reaction causing immediate rash, facial/tongue/throat swelling, SOB or lightheadedness with hypotension:  No Has patient had a PCN reaction causing severe rash involving mucus membranes or skin necrosis: No Has patient had a PCN reaction that required hospitalization: No Has patient had a PCN reaction occurring within the last 10 years: No If all of the above answers are NO, then may proceed with Cephalosporin use.        Medication List     PAUSE taking these medications    hydrocortisone  5 MG tablet Wait to take this until: September 22, 2023 Commonly known as: CORTEF  TAKE THREE TABLETS IN THE MORNING AND ONE TABLET IN THE AFTERNOON       STOP taking these medications    lidocaine  5 % Commonly known as: LIDODERM        TAKE these medications    acetaminophen  500 MG tablet Commonly known as: TYLENOL  Take 1,000 mg by mouth every 8 (eight) hours as needed for mild pain (pain score 1-3) or moderate pain (pain score 4-6).   amitriptyline  50 MG tablet Commonly known as: ELAVIL  Take 1 tablet (50 mg total) by mouth at bedtime.   amLODipine  2.5 MG tablet Commonly known as: NORVASC  Take 1 tablet (2.5 mg total) by mouth daily.   Baclofen  5 MG Tabs TAKE 1 TABLET BY MOUTH EVERY 8 HOURS AS NEEDED   Belsomra  20 MG Tabs Generic drug: Suvorexant  Take 1 tablet (20 mg total) by mouth at bedtime.   diphenhydrAMINE  25 mg capsule Commonly known as: BENADRYL  Take 1 capsule (25 mg total) by mouth every 6 (six) hours as needed for itching.  EPINEPHrine  0.3 mg/0.3 mL Soaj injection Commonly known as: EpiPen  2-Pak Inject 0.3 mg into the muscle as needed for anaphylaxis.   escitalopram  10 MG tablet Commonly known as: LEXAPRO  Take 1 tablet (10 mg total) by mouth daily.   FIBER ADULT GUMMIES PO Take 1 tablet by mouth daily as needed.   furosemide  40 MG tablet Commonly known as: LASIX  TAKE 1 TABLET BY MOUTH EVERY DAY   HYDROcodone -acetaminophen  5-325 MG tablet Commonly known as: NORCO/VICODIN Take 1 tablet by mouth every 6 (six) hours as needed for moderate pain (pain score  4-6).   losartan  50 MG tablet Commonly known as: COZAAR  Take 1 tablet (50 mg total) by mouth daily.   meclizine  25 MG tablet Commonly known as: ANTIVERT  Take 1 tablet (25 mg total) by mouth 3 (three) times daily as needed for dizziness.   metFORMIN  1000 MG tablet Commonly known as: GLUCOPHAGE  Take 1 tablet (1,000 mg total) by mouth 2 (two) times daily with a meal.   OneTouch UltraSoft 2 Lancets Misc USE TO CHECK BLOOD SUGAR 3 TIMES A DAY AND DOCUMENT RESULTS, BRING TO APPOINTMENTS. GOAL IS <130 FASTING BLOOD SUGAR AND <180 TWO HOURS AFTER MEALS.   OneTouch Verio w/Device Kit Use to check blood sugar 3 times a day and document results, bring to appointments.  Goal is <130 fasting blood sugar and <180 two hours after meals.   Accu-Chek Guide w/Device Kit To check blood sugar 2-3 times daily and document for visits.   predniSONE  10 MG tablet Commonly known as: DELTASONE  Take 6 tablets (60 mg total) by mouth daily for 3 days, THEN 5 tablets (50 mg total) daily for 3 days, THEN 4 tablets (40 mg total) daily for 3 days, THEN 3 tablets (30 mg total) daily for 3 days, THEN 2 tablets (20 mg total) daily for 3 days. Start taking on: September 07, 2023   pregabalin  150 MG capsule Commonly known as: LYRICA  150 mg in am, 150 MG in afternoon, and 300 mg in pm   rosuvastatin  10 MG tablet Commonly known as: CRESTOR  Take 1 tablet (10 mg total) by mouth daily.   VITAMIN B-12 PO Take 1 tablet by mouth at bedtime.   Vitamin D  50 MCG (2000 UT) Caps Take 1 capsule by mouth at bedtime.               Durable Medical Equipment  (From admission, onward)           Start     Ordered   09/07/23 1245  For home use only DME 4 wheeled rolling walker with seat  Once       Question:  Patient needs a walker to treat with the following condition  Answer:  Anaphylaxis   09/07/23 1244            Discharge Exam:   Subjective: Feeling much better.  Wants to leave today.  Sloughing is  ongoing but rash is significantly improved.   Objective: Vitals:   09/07/23 0424 09/07/23 0903  BP: 139/71 (!) 159/75  Pulse: 72 70  Resp: 20 18  Temp: 98 F (36.7 C) 97.8 F (36.6 C)  SpO2: 96% 93%    Intake/Output Summary (Last 24 hours) at 09/07/2023 1244 Last data filed at 09/07/2023 0900 Gross per 24 hour  Intake 600 ml  Output --  Net 600 ml   Filed Weights   09/04/23 1743  Weight: 105.2 kg    Exam:  General:  Appears calm and comfortable and  is in NAD, on RA Eyes:   normal lids, iris ENT:  grossly normal hearing, lips & tongue, mmm; small improving petechial lesions and a few tiny ulcerations on her hard palate are improving Cardiovascular:  RRR. No LE edema.  Respiratory:   CTA bilaterally with no wheezes/rales/rhonchi.  Normal respiratory effort. Abdomen:  soft, NT, ND Skin:  rash or arms is significantly improved, generally resolved elsewhere with mild sloughing of skin on back and chest Musculoskeletal:  grossly normal tone BUE/BLE, good ROM, no bony abnormality Psychiatric:  blunted mood and affect, speech fluent and appropriate, AOx3 Neurologic:  CN 2-12 grossly intact, moves all extremities in coordinated fashion  Data Reviewed: I have reviewed the patient's lab results since admission.  Pertinent labs for today include:   Glucose 176 Procalcitonin 0.13 WBC 11.4, improving Hgb 9.6 Blood cultures NTD    Condition at discharge: improving  The results of significant diagnostics from this hospitalization (including imaging, microbiology, ancillary and laboratory) are listed below for reference.   Imaging Studies: ECHOCARDIOGRAM COMPLETE Result Date: 09/05/2023    ECHOCARDIOGRAM REPORT   Patient Name:   KARESSA ONORATO Date of Exam: 09/05/2023 Medical Rec #:  [id number]    Height:       64.0 in Accession #:    [id number]   Weight:       232.0 lb Date of Birth:  05/22/1950    BSA:          2.084 m Patient Age:    73 years     BP:           91/47 mmHg Patient  Gender: F            HR:           67 bpm. Exam Location:  [facility] Procedure: 2D Echo, Cardiac Doppler and Color Doppler (Both Spectral and Color            Flow Doppler were utilized during procedure). Indications:     CHF- Acute Systolic I50.21  History:         Patient has no prior history of Echocardiogram examinations.  Sonographer:     Bernice Rubinstein RDCS Referring Phys:  [id number] PRENTICE BROCKS CORE Diagnosing Phys: Cara JONETTA Lovelace MD  Sonographer Comments: Suboptimal subcostal window and patient is obese. Image acquisition challenging due to respiratory motion. IMPRESSIONS  1. Left ventricular ejection fraction, by estimation, is 55 to 60%. The left ventricle has normal function. The left ventricle has no regional wall motion abnormalities. Left ventricular diastolic parameters are consistent with Grade II diastolic dysfunction (pseudonormalization).  2. Right ventricular systolic function is normal. The right ventricular size is normal.  3. The mitral valve is normal in structure. No evidence of mitral valve regurgitation.  4. The aortic valve is normal in structure. Aortic valve regurgitation is not visualized. FINDINGS  Left Ventricle: Left ventricular ejection fraction, by estimation, is 55 to 60%. The left ventricle has normal function. The left ventricle has no regional wall motion abnormalities. Strain was performed and the global longitudinal strain is indeterminate. The left ventricular internal cavity size was normal in size. There is no left ventricular hypertrophy. Left ventricular diastolic parameters are consistent with Grade II diastolic dysfunction (pseudonormalization). Right Ventricle: The right ventricular size is normal. No increase in right ventricular wall thickness. Right ventricular systolic function is normal. Left Atrium: Left atrial size was normal in size. Right Atrium: Right atrial size was normal in size. Pericardium: There is no evidence of pericardial  effusion. Mitral Valve: The mitral  valve is normal in structure. No evidence of mitral valve regurgitation. Tricuspid Valve: The tricuspid valve is normal in structure. Tricuspid valve regurgitation is trivial. Aortic Valve: The aortic valve is normal in structure. Aortic valve regurgitation is not visualized. Aortic valve peak gradient measures 8.2 mmHg. Pulmonic Valve: The pulmonic valve was normal in structure. Pulmonic valve regurgitation is not visualized. Aorta: The ascending aorta was not well visualized. IAS/Shunts: No atrial level shunt detected by color flow Doppler. Additional Comments: 3D was performed not requiring image post processing on an independent workstation and was indeterminate.  LEFT VENTRICLE PLAX 2D LVIDd:         5.40 cm   Diastology LVIDs:         3.70 cm   LV e' medial:    9.03 cm/s LV PW:         1.00 cm   LV E/e' medial:  14.0 LV IVS:        1.00 cm   LV e' lateral:   12.40 cm/s LVOT diam:     2.00 cm   LV E/e' lateral: 10.2 LV SV:         91 LV SV Index:   44 LVOT Area:     3.14 cm  RIGHT VENTRICLE RV Basal diam:  2.60 cm RV S prime:     9.32 cm/s TAPSE (M-mode): 2.1 cm LEFT ATRIUM             Index        RIGHT ATRIUM           Index LA diam:        4.10 cm 1.97 cm/m   RA Area:     13.40 cm LA Vol (A2C):   61.2 ml 29.37 ml/m  RA Volume:   34.10 ml  16.36 ml/m LA Vol (A4C):   35.6 ml 17.08 ml/m LA Biplane Vol: 47.7 ml 22.89 ml/m  AORTIC VALVE                 PULMONIC VALVE AV Area (Vmax): 2.55 cm     PV Vmax:        0.83 m/s AV Vmax:        143.00 cm/s  PV Peak grad:   2.8 mmHg AV Peak Grad:   8.2 mmHg     RVOT Peak grad: 1 mmHg LVOT Vmax:      116.00 cm/s LVOT Vmean:     80.700 cm/s LVOT VTI:       0.289 m  AORTA Ao Root diam: 2.80 cm Ao Asc diam:  3.10 cm MITRAL VALVE MV Area (PHT): 4.86 cm     SHUNTS MV Decel Time: 156 msec     Systemic VTI:  0.29 m MV E velocity: 126.00 cm/s  Systemic Diam: 2.00 cm MV A velocity: 77.80 cm/s MV E/A ratio:  1.62 Cara JONETTA Lovelace MD Electronically signed by Cara JONETTA Lovelace  MD Signature Date/Time: 09/05/2023/2:52:20 PM    Final    US  Abdomen Complete Result Date: 09/04/2023 CLINICAL DATA:  Splenomegaly EXAM: ABDOMEN ULTRASOUND COMPLETE COMPARISON:  CT 08/30/2023 FINDINGS: Gallbladder: Prior cholecystectomy. Common bile duct: Diameter: Common bile duct not visualized, normal caliber on recent CT. Liver: Hepatomegaly. Diffusely increased echotexture compatible with fatty infiltration. No focal abnormality. Portal vein is patent on color Doppler imaging with normal direction of blood flow towards the liver. IVC: No abnormality visualized. Pancreas: Visualized portion unremarkable. Spleen: Normal size with a craniocaudal  length of 11.8 cm. No focal abnormality. Right Kidney: Length: 11.1 cm. Echogenicity within normal limits. No mass or hydronephrosis visualized. Left Kidney: Length: 11.8 cm. Echogenicity within normal limits. No mass or hydronephrosis visualized. Abdominal aorta: No aneurysm visualized. Other findings: None. IMPRESSION: Hepatomegaly. Diffusely increased echotexture compatible with fatty infiltration. No evidence of splenomegaly. Electronically Signed   By: Franky Crease M.D.   On: 09/04/2023 23:37   CT Angio Chest PE W and/or Wo Contrast Result Date: 09/04/2023 CLINICAL DATA:  Hypoxic EXAM: CT ANGIOGRAPHY CHEST WITH CONTRAST TECHNIQUE: Multidetector CT imaging of the chest was performed using the standard protocol during bolus administration of intravenous contrast. Multiplanar CT image reconstructions and MIPs were obtained to evaluate the vascular anatomy. RADIATION DOSE REDUCTION: This exam was performed according to the departmental dose-optimization program which includes automated exposure control, adjustment of the mA and/or kV according to patient size and/or use of iterative reconstruction technique. CONTRAST:  OMNIPAQUE  IOHEXOL  350 MG/ML SOLN COMPARISON:  Chest x-ray 09/04/2023, CT 08/30/2023 FINDINGS: Cardiovascular: Satisfactory opacification of the  pulmonary arteries to the segmental level. No evidence of pulmonary embolism. Mild aortic atherosclerosis. No aneurysm or dissection. Coronary vascular calcification. Mild cardiomegaly. No pericardial effusion Mediastinum/Nodes: Patent trachea. No thyroid  mass. Mildly enlarged right paratracheal node measuring 10 mm, slightly increased compared to prior. Esophagus within normal limits. Lungs/Pleura: Interval trace bilateral effusions. Dependent atelectasis in the lower lobes. Mild diffuse bilateral hazy pulmonary density could be due to mild edema. More patchy and organized airspace disease in the lower lobes could reflect edema or pneumonia. Punctate pulmonary nodules, largest is seen in the left upper lobe and measures 3 mm on series 5, image 68. Upper Abdomen: No acute finding.  Hepatic steatosis Musculoskeletal: No acute or suspicious osseous abnormality Review of the MIP images confirms the above findings. IMPRESSION: 1. Negative for acute pulmonary embolus. 2. Interval trace bilateral effusions with dependent atelectasis in the lower lobes. Mild diffuse bilateral hazy pulmonary density could be due to mild edema. More patchy and organized airspace disease in the lower lobes could reflect edema or pneumonia. 3. Mildly enlarged right paratracheal node, slightly increased compared to prior, likely reactive. 4. Punctate pulmonary nodules measuring up to 3 mm. No follow-up needed if patient is low-risk (and has no known or suspected primary neoplasm). Non-contrast chest CT can be considered in 12 months if patient is high-risk. This recommendation follows the consensus statement: Guidelines for Management of Incidental Pulmonary Nodules Detected on CT Images: From the Fleischner Society 2017; Radiology 2017; 284:228-243. 5. Aortic atherosclerosis. Aortic Atherosclerosis (ICD10-I70.0). Electronically Signed   By: Luke Bun M.D.   On: 09/04/2023 20:16   DG Chest Portable 1 View Result Date: 09/04/2023 CLINICAL  DATA:  Hypoxia EXAM: PORTABLE CHEST 1 VIEW COMPARISON:  August 30, 2023 FINDINGS: Patchy opacities at the bilateral lung bases, right greater than left. No definite pleural effusions. No pneumothorax. Unchanged cardiomediastinal silhouette. No acute osseous findings. IMPRESSION: Patchy opacities at the bilateral lung bases, may represent infection and/or atelectasis. Electronically Signed   By: Michaeline Blanch M.D.   On: 09/04/2023 18:05   CT CHEST ABDOMEN PELVIS WO CONTRAST Result Date: 08/30/2023 CLINICAL DATA:  Rash, shortness of breath, hypotension. EXAM: CT CHEST, ABDOMEN AND PELVIS WITHOUT CONTRAST TECHNIQUE: Multidetector CT imaging of the chest, abdomen and pelvis was performed following the standard protocol without IV contrast. RADIATION DOSE REDUCTION: This exam was performed according to the departmental dose-optimization program which includes automated exposure control, adjustment of the mA and/or kV  according to patient size and/or use of iterative reconstruction technique. COMPARISON:  CT abdomen pelvis 09/04/2017. FINDINGS: CT CHEST FINDINGS Cardiovascular: Atherosclerotic calcification of the aorta, aortic valve and coronary arteries. Heart is enlarged. No pericardial effusion. Mediastinum/Nodes: No pathologically enlarged mediastinal or axillary lymph nodes. Hilar regions are difficult to definitively evaluate without IV contrast. Multiple subpectoral lymph nodes are not considered enlarged by CT size criteria but are noted. Esophagus is grossly unremarkable. Lungs/Pleura: Image quality is degraded by expiratory phase imaging and respiratory motion. 2 mm apical segment right upper lobe nodule (4/34) and 3 mm lingular nodule (4/68). Minimal dependent atelectasis in the right lower lobe. No pleural fluid. Airway is otherwise unremarkable. Musculoskeletal: Degenerative changes in the spine. CT ABDOMEN PELVIS FINDINGS Hepatobiliary: Liver is decreased in attenuation diffusely and is enlarged, 20.4 cm.  Cholecystectomy. No biliary ductal dilatation. Pancreas: Negative. Spleen: Negative. Adrenals/Urinary Tract: Adrenal glands and kidneys are unremarkable. Ureters are decompressed. Bladder is grossly unremarkable. Stomach/Bowel: Tiny hiatal hernia. Stomach, small bowel, appendix and colon are unremarkable. Vascular/Lymphatic: Atherosclerotic calcification of the aorta. No pathologically enlarged lymph nodes. Small left inguinal lymph nodes are unchanged. Surgical clips in the right groin. Reproductive: Hysterectomy.  No adnexal mass. Other: No free fluid. Musculoskeletal: Degenerative changes in the spine. L4-S1 posterior lumbar interbody fusion. IMPRESSION: 1. No acute findings. 2. Steatotic enlarged liver. 3. Tiny upper lobe pulmonary nodules. No follow-up needed if patient is low-risk (and has no known or suspected primary neoplasm). Non-contrast chest CT can be considered in 12 months if patient is high-risk. This recommendation follows the consensus statement: Guidelines for Management of Incidental Pulmonary Nodules Detected on CT Images: From the Fleischner Society 2017; Radiology 2017; 284:228-243. 4. Aortic atherosclerosis (ICD10-I70.0). Coronary artery calcification. Electronically Signed   By: Newell Eke M.D.   On: 08/30/2023 15:39   CT Head Wo Contrast Result Date: 08/30/2023 CLINICAL DATA:  Hypotensive with rash and shortness of breath. EXAM: CT HEAD WITHOUT CONTRAST TECHNIQUE: Contiguous axial images were obtained from the base of the skull through the vertex without intravenous contrast. RADIATION DOSE REDUCTION: This exam was performed according to the departmental dose-optimization program which includes automated exposure control, adjustment of the mA and/or kV according to patient size and/or use of iterative reconstruction technique. COMPARISON:  September 03, 2017 FINDINGS: Brain: There is generalized cerebral atrophy with widening of the extra-axial spaces and ventricular dilatation. There  are areas of decreased attenuation within the white matter tracts of the supratentorial brain, consistent with microvascular disease changes. A chronic left cerebellar infarct is noted. Vascular: No hyperdense vessel or unexpected calcification. Skull: Normal. Negative for fracture or focal lesion. Sinuses/Orbits: No acute finding. Other: None. IMPRESSION: 1. Generalized cerebral atrophy with chronic white matter small vessel ischemic changes. 2. Chronic left cerebellar infarct. 3. No acute intracranial abnormality. Electronically Signed   By: [name] Dials M.D.   On: 08/30/2023 15:36   DG Chest Portable 1 View Result Date: 08/30/2023 CLINICAL DATA:  Sepsis EXAM: PORTABLE CHEST - 1 VIEW COMPARISON:  October 04, 2017 FINDINGS: Low lung volumes. Patient is rotated towards the right. No focal airspace consolidation, pleural effusion, or pneumothorax. The cardiac silhouette is at the upper limits of normal, likely accentuated by AP technique and low lung volumes. Tortuous aorta with aortic atherosclerosis. No acute fracture or destructive lesions. Multilevel thoracic osteophytosis. IMPRESSION: No acute cardiopulmonary abnormality. Electronically Signed   By: Rogelia Myers M.D.   On: 08/30/2023 13:39    Microbiology: Results for orders placed or performed during the  hospital encounter of 09/04/23  Blood culture (routine x 2)     Status: None (Preliminary result)   Collection Time: 09/04/23  5:54 PM   Specimen: BLOOD  Result Value Ref Range Status   Specimen Description BLOOD BLOOD LEFT FOREARM  Final   Special Requests   Final    BOTTLES DRAWN AEROBIC AND ANAEROBIC Blood Culture results may not be optimal due to an inadequate volume of blood received in culture bottles   Culture   Final    NO GROWTH 3 DAYS Performed at [facility], [address] [place], [place] [address]    Report Status PENDING  Incomplete  Resp panel by RT-PCR (RSV, Flu A&B, Covid) Anterior Nasal Swab      Status: None   Collection Time: 09/04/23  6:30 PM   Specimen: Anterior Nasal Swab  Result Value Ref Range Status   SARS Coronavirus 2 by RT PCR NEGATIVE NEGATIVE Final    Comment: (NOTE) SARS-CoV-2 target nucleic acids are NOT DETECTED.  The SARS-CoV-2 RNA is generally detectable in upper respiratory specimens during the acute phase of infection. The lowest concentration of SARS-CoV-2 viral copies this assay can detect is 138 copies/mL. A negative result does not preclude SARS-Cov-2 infection and should not be used as the sole basis for treatment or other patient management decisions. A negative result may occur with  improper specimen collection/handling, submission of specimen other than nasopharyngeal swab, presence of viral mutation(s) within the areas targeted by this assay, and inadequate number of viral copies(<138 copies/mL). A negative result must be combined with clinical observations, patient history, and epidemiological information. The expected result is Negative.  Fact Sheet for Patients:  BloggerCourse.com  Fact Sheet for Healthcare Providers:  SeriousBroker.it  This test is no t yet approved or cleared by the [place]  FDA and  has been authorized for detection and/or diagnosis of SARS-CoV-2 by FDA under an Emergency Use Authorization (EUA). This EUA will remain  in effect (meaning this test can be used) for the duration of the COVID-19 declaration under Section 564(b)(1) of the Act, 21 U.S.C.section 360bbb-3(b)(1), unless the authorization is terminated  or revoked sooner.       Influenza A by PCR NEGATIVE NEGATIVE Final   Influenza B by PCR NEGATIVE NEGATIVE Final    Comment: (NOTE) The Xpert Xpress SARS-CoV-2/FLU/RSV plus assay is intended as an aid in the diagnosis of influenza from Nasopharyngeal swab specimens and should not be used as a sole basis for treatment. Nasal washings and aspirates are  unacceptable for Xpert Xpress SARS-CoV-2/FLU/RSV testing.  Fact Sheet for Patients: BloggerCourse.com  Fact Sheet for Healthcare Providers: SeriousBroker.it  This test is not yet approved or cleared by the [place]  FDA and has been authorized for detection and/or diagnosis of SARS-CoV-2 by FDA under an Emergency Use Authorization (EUA). This EUA will remain in effect (meaning this test can be used) for the duration of the COVID-19 declaration under Section 564(b)(1) of the Act, 21 U.S.C. section 360bbb-3(b)(1), unless the authorization is terminated or revoked.     Resp Syncytial Virus by PCR NEGATIVE NEGATIVE Final    Comment: (NOTE) Fact Sheet for Patients: BloggerCourse.com  Fact Sheet for Healthcare Providers: SeriousBroker.it  This test is not yet approved or cleared by the [place]  FDA and has been authorized for detection and/or diagnosis of SARS-CoV-2 by FDA under an Emergency Use Authorization (EUA). This EUA will remain in effect (meaning this test can be used) for the duration of the COVID-19  declaration under Section 564(b)(1) of the Act, 21 U.S.C. section 360bbb-3(b)(1), unless the authorization is terminated or revoked.  Performed at [facility], [address] [place], [place] [address]   Blood culture (routine x 2)     Status: None (Preliminary result)   Collection Time: 09/04/23  9:11 PM   Specimen: BLOOD  Result Value Ref Range Status   Specimen Description BLOOD BLOOD RIGHT HAND  Final   Special Requests   Final    BOTTLES DRAWN AEROBIC AND ANAEROBIC Blood Culture results may not be optimal due to an inadequate volume of blood received in culture bottles   Culture   Final    NO GROWTH 3 DAYS Performed at [facility], [address] [place], [place] [address]    Report Status PENDING  Incomplete  MRSA Next Gen by PCR,  Nasal     Status: None   Collection Time: 09/05/23  4:53 AM   Specimen: Nasal Mucosa; Nasal Swab  Result Value Ref Range Status   MRSA by PCR Next Gen NOT DETECTED NOT DETECTED Final    Comment: (NOTE) The GeneXpert MRSA Assay (FDA approved for NASAL specimens only), is one component of a comprehensive MRSA colonization surveillance program. It is not intended to diagnose MRSA infection nor to guide or monitor treatment for MRSA infections. Test performance is not FDA approved in patients less than 8 years old. Performed at [facility], [address] [place], [place] [address]     Labs: CBC: Recent Labs  Lab 09/04/23 1755 09/04/23 2300 09/05/23 0453 09/06/23 0534 09/07/23 0530  WBC 19.8* 20.2* 17.1* 13.7* 11.4*  NEUTROABS 9.3*  --   --  10.3* 8.5*  HGB 10.8* 9.8* 10.4* 9.8* 9.6*  HCT 34.1* 30.0* 31.4* 29.8* 29.7*  MCV 90.9 90.6 89.0 88.4 88.1  PLT 185 172 163 183 184   Basic Metabolic Panel: Recent Labs  Lab 09/01/23 0350 09/02/23 0316 09/04/23 1755 09/04/23 2300 09/05/23 0453 09/06/23 0534 09/07/23 0530  NA 137 141 141  --  138 141 141  K 4.3 4.2 3.8  --  3.9 3.9 3.8  CL 104 105 107  --  105 106 107  CO2 24 26 24   --  23 23 24   GLUCOSE 161* 143* 184*  --  258* 209* 176*  BUN 27* 28* 18  --  22 28* 37*  CREATININE 1.18* 1.00 1.06* 0.99 1.11* 0.97 0.99  CALCIUM  8.2* 8.3* 7.9*  --  7.6* 7.8* 8.0*  MG 2.1 2.2  --   --   --   --   --   PHOS 2.5 2.3*  --   --   --   --   --    Liver Function Tests: Recent Labs  Lab 09/04/23 1755 09/05/23 0453  AST 46* 65*  ALT 43 80*  ALKPHOS 82 85  BILITOT 1.2 1.2  PROT 6.4* 6.3*  ALBUMIN 2.8* 2.5*   CBG: Recent Labs  Lab 09/06/23 2136 09/07/23 0018 09/07/23 0426 09/07/23 0902 09/07/23 1206  GLUCAP 221* 199* 164* 203* 166*    Discharge time spent: greater than 30 minutes.  Signed: Delon Herald, MD [facility] 09/07/2023

## 2023-09-09 LAB — CULTURE, BLOOD (ROUTINE X 2)
Culture: NO GROWTH
Culture: NO GROWTH

## 2023-09-11 NOTE — Patient Instructions (Signed)

## 2023-09-14 ENCOUNTER — Ambulatory Visit: Admitting: Nurse Practitioner

## 2023-09-14 ENCOUNTER — Emergency Department

## 2023-09-14 ENCOUNTER — Other Ambulatory Visit: Payer: Self-pay

## 2023-09-14 ENCOUNTER — Encounter: Payer: Self-pay | Admitting: Nurse Practitioner

## 2023-09-14 ENCOUNTER — Observation Stay

## 2023-09-14 ENCOUNTER — Observation Stay
Admission: EM | Admit: 2023-09-14 | Discharge: 2023-09-16 | Disposition: A | Discharge status: Skilled Nursing Facility | Source: Ambulatory Visit | Attending: Internal Medicine | Admitting: Internal Medicine

## 2023-09-14 VITALS — BP 91/61 | HR 76 | Temp 98.7°F | Ht 64.5 in | Wt 225.6 lb

## 2023-09-14 DIAGNOSIS — E66812 Obesity, class 2: Secondary | ICD-10-CM

## 2023-09-14 DIAGNOSIS — R55 Syncope and collapse: Secondary | ICD-10-CM | POA: Diagnosis present

## 2023-09-14 DIAGNOSIS — E1122 Type 2 diabetes mellitus with diabetic chronic kidney disease: Secondary | ICD-10-CM | POA: Diagnosis not present

## 2023-09-14 DIAGNOSIS — E271 Primary adrenocortical insufficiency: Secondary | ICD-10-CM

## 2023-09-14 DIAGNOSIS — D72829 Elevated white blood cell count, unspecified: Secondary | ICD-10-CM | POA: Diagnosis not present

## 2023-09-14 DIAGNOSIS — D649 Anemia, unspecified: Secondary | ICD-10-CM

## 2023-09-14 DIAGNOSIS — T50905A Adverse effect of unspecified drugs, medicaments and biological substances, initial encounter: Secondary | ICD-10-CM

## 2023-09-14 DIAGNOSIS — E1169 Type 2 diabetes mellitus with other specified complication: Secondary | ICD-10-CM

## 2023-09-14 DIAGNOSIS — N179 Acute kidney failure, unspecified: Secondary | ICD-10-CM | POA: Diagnosis not present

## 2023-09-14 DIAGNOSIS — Z79899 Other long term (current) drug therapy: Secondary | ICD-10-CM | POA: Diagnosis not present

## 2023-09-14 DIAGNOSIS — N189 Chronic kidney disease, unspecified: Secondary | ICD-10-CM | POA: Diagnosis not present

## 2023-09-14 DIAGNOSIS — J9601 Acute respiratory failure with hypoxia: Secondary | ICD-10-CM | POA: Diagnosis not present

## 2023-09-14 DIAGNOSIS — I5030 Unspecified diastolic (congestive) heart failure: Secondary | ICD-10-CM | POA: Insufficient documentation

## 2023-09-14 DIAGNOSIS — D7212 Drug rash with eosinophilia and systemic symptoms syndrome: Secondary | ICD-10-CM

## 2023-09-14 DIAGNOSIS — I130 Hypertensive heart and chronic kidney disease with heart failure and stage 1 through stage 4 chronic kidney disease, or unspecified chronic kidney disease: Secondary | ICD-10-CM | POA: Insufficient documentation

## 2023-09-14 DIAGNOSIS — E1142 Type 2 diabetes mellitus with diabetic polyneuropathy: Secondary | ICD-10-CM | POA: Diagnosis not present

## 2023-09-14 DIAGNOSIS — I152 Hypertension secondary to endocrine disorders: Secondary | ICD-10-CM | POA: Diagnosis present

## 2023-09-14 DIAGNOSIS — I959 Hypotension, unspecified: Secondary | ICD-10-CM | POA: Insufficient documentation

## 2023-09-14 DIAGNOSIS — E1140 Type 2 diabetes mellitus with diabetic neuropathy, unspecified: Secondary | ICD-10-CM | POA: Diagnosis not present

## 2023-09-14 DIAGNOSIS — J9602 Acute respiratory failure with hypercapnia: Secondary | ICD-10-CM | POA: Diagnosis not present

## 2023-09-14 DIAGNOSIS — Z6836 Body mass index (BMI) 36.0-36.9, adult: Secondary | ICD-10-CM

## 2023-09-14 DIAGNOSIS — J1569 Pneumonia due to other gram-negative bacteria: Secondary | ICD-10-CM | POA: Diagnosis not present

## 2023-09-14 DIAGNOSIS — R634 Abnormal weight loss: Secondary | ICD-10-CM | POA: Insufficient documentation

## 2023-09-14 DIAGNOSIS — R6510 Systemic inflammatory response syndrome (SIRS) of non-infectious origin without acute organ dysfunction: Secondary | ICD-10-CM

## 2023-09-14 DIAGNOSIS — F3341 Major depressive disorder, recurrent, in partial remission: Secondary | ICD-10-CM

## 2023-09-14 DIAGNOSIS — I5032 Chronic diastolic (congestive) heart failure: Secondary | ICD-10-CM | POA: Diagnosis not present

## 2023-09-14 DIAGNOSIS — E119 Type 2 diabetes mellitus without complications: Secondary | ICD-10-CM

## 2023-09-14 DIAGNOSIS — Z7984 Long term (current) use of oral hypoglycemic drugs: Secondary | ICD-10-CM | POA: Diagnosis not present

## 2023-09-14 DIAGNOSIS — R531 Weakness: Principal | ICD-10-CM

## 2023-09-14 DIAGNOSIS — E860 Dehydration: Secondary | ICD-10-CM

## 2023-09-14 DIAGNOSIS — E1159 Type 2 diabetes mellitus with other circulatory complications: Secondary | ICD-10-CM | POA: Diagnosis present

## 2023-09-14 DIAGNOSIS — E861 Hypovolemia: Secondary | ICD-10-CM

## 2023-09-14 LAB — CBC
HCT: 36.8 % (ref 36.0–46.0)
Hematocrit: 36.7 % (ref 34.0–46.6)
Hemoglobin: 12.3 g/dL (ref 11.1–15.9)
Hemoglobin: 12 g/dL (ref 12.0–15.0)
MCH: 29.4 pg (ref 26.6–33.0)
MCH: 29.3 pg (ref 26.0–34.0)
MCHC: 33.5 g/dL (ref 31.5–35.7)
MCHC: 32.6 g/dL (ref 30.0–36.0)
MCV: 88 fL (ref 79–97)
MCV: 89.8 fL (ref 80.0–100.0)
Platelets: 235 x10E3/uL (ref 150–450)
Platelets: 245 K/uL (ref 150–400)
RBC: 4.18 x10E6/uL (ref 3.77–5.28)
RBC: 4.1 MIL/uL (ref 3.87–5.11)
RDW: 14.2 % (ref 11.7–15.4)
RDW: 13.7 % (ref 11.5–15.5)
WBC: 11.8 x10E3/uL — ABNORMAL HIGH (ref 3.4–10.8)
WBC: 15.7 K/uL — ABNORMAL HIGH (ref 4.0–10.5)
nRBC: 0 % (ref 0.0–0.2)

## 2023-09-14 LAB — BASIC METABOLIC PANEL WITH GFR
Anion gap: 15 (ref 5–15)
BUN: 20 mg/dL (ref 8–23)
CO2: 30 mmol/L (ref 22–32)
Calcium: 9.1 mg/dL (ref 8.9–10.3)
Chloride: 98 mmol/L (ref 98–111)
Creatinine, Ser: 1.77 mg/dL — ABNORMAL HIGH (ref 0.44–1.00)
GFR, Estimated: 30 mL/min — ABNORMAL LOW (ref >60)
Glucose, Bld: 155 mg/dL — ABNORMAL HIGH (ref 70–99)
Potassium: 3.7 mmol/L (ref 3.5–5.1)
Sodium: 143 mmol/L (ref 135–145)

## 2023-09-14 LAB — PROTIME-INR
INR: 1 (ref 0.8–1.2)
Prothrombin Time: 13.8 s (ref 11.4–15.2)

## 2023-09-14 LAB — HEPATIC FUNCTION PANEL
ALT: 15 U/L (ref 0–44)
AST: 18 U/L (ref 15–41)
Albumin: 2.9 g/dL — ABNORMAL LOW (ref 3.5–5.0)
Alkaline Phosphatase: 48 U/L (ref 38–126)
Bilirubin, Direct: 0.2 mg/dL (ref 0.0–0.2)
Indirect Bilirubin: 0.9 mg/dL (ref 0.3–0.9)
Total Bilirubin: 1.1 mg/dL (ref 0.0–1.2)
Total Protein: 5.8 g/dL — ABNORMAL LOW (ref 6.5–8.1)

## 2023-09-14 LAB — BRAIN NATRIURETIC PEPTIDE: B Natriuretic Peptide: 23.5 pg/mL (ref 0.0–100.0)

## 2023-09-14 LAB — TROPONIN I (HIGH SENSITIVITY)
Troponin I (High Sensitivity): 9 ng/L (ref <18)
Troponin I (High Sensitivity): 7 ng/L (ref <18)

## 2023-09-14 LAB — GLUCOSE, CAPILLARY: Glucose-Capillary: 213 mg/dL — ABNORMAL HIGH (ref 70–99)

## 2023-09-14 LAB — LACTIC ACID, PLASMA
Lactic Acid, Venous: 2.4 mmol/L (ref 0.5–1.9)
Lactic Acid, Venous: 1.5 mmol/L (ref 0.5–1.9)

## 2023-09-14 LAB — MAGNESIUM: Magnesium: 1.6 mg/dL — ABNORMAL LOW (ref 1.7–2.4)

## 2023-09-14 LAB — D-DIMER, QUANTITATIVE: D-Dimer, Quant: 0.6 ug{FEU}/mL — ABNORMAL HIGH (ref 0.00–0.50)

## 2023-09-14 MED ORDER — HYDROCORTISONE SOD SUC (PF) 100 MG IJ SOLR
100.0000 mg | Freq: Once | INTRAMUSCULAR | Status: AC
  Administered 2023-09-14: 100 mg via INTRAVENOUS
  Filled 2023-09-14: qty 2

## 2023-09-14 MED ORDER — ENOXAPARIN SODIUM 60 MG/0.6ML IJ SOSY
0.5000 mg/kg | PREFILLED_SYRINGE | INTRAMUSCULAR | Status: DC
  Administered 2023-09-14 – 2023-09-15 (×2): 50 mg via SUBCUTANEOUS
  Filled 2023-09-14 (×2): qty 0.6

## 2023-09-14 MED ORDER — SODIUM CHLORIDE 0.9% FLUSH
3.0000 mL | Freq: Two times a day (BID) | INTRAVENOUS | Status: DC
  Administered 2023-09-14 – 2023-09-16 (×5): 3 mL via INTRAVENOUS

## 2023-09-14 MED ORDER — LORAZEPAM 2 MG/ML IJ SOLN
0.5000 mg | INTRAMUSCULAR | Status: AC
  Administered 2023-09-14: 0.5 mg via INTRAVENOUS
  Filled 2023-09-14: qty 1

## 2023-09-14 MED ORDER — ADULT MULTIVITAMIN W/MINERALS CH
1.0000 | ORAL_TABLET | Freq: Every day | ORAL | Status: DC
  Administered 2023-09-14 – 2023-09-16 (×3): 1 via ORAL
  Filled 2023-09-14 (×3): qty 1

## 2023-09-14 MED ORDER — METRONIDAZOLE 500 MG/100ML IV SOLN
500.0000 mg | Freq: Once | INTRAVENOUS | Status: AC
  Administered 2023-09-14: 500 mg via INTRAVENOUS
  Filled 2023-09-14: qty 100

## 2023-09-14 MED ORDER — ONDANSETRON HCL 4 MG PO TABS: 4.0000 mg | ORAL_TABLET | Freq: Four times a day (QID) | ORAL | Status: DC | PRN

## 2023-09-14 MED ORDER — LACTATED RINGERS IV SOLN: INTRAVENOUS | Status: AC

## 2023-09-14 MED ORDER — SODIUM CHLORIDE 0.9 % IV BOLUS
500.0000 mL | Freq: Once | INTRAVENOUS | Status: AC
  Administered 2023-09-14: 500 mL via INTRAVENOUS

## 2023-09-14 MED ORDER — MAGNESIUM SULFATE IN D5W 1-5 GM/100ML-% IV SOLN
1.0000 g | Freq: Once | INTRAVENOUS | Status: AC
  Administered 2023-09-14: 1 g via INTRAVENOUS
  Filled 2023-09-14: qty 100

## 2023-09-14 MED ORDER — HYDROCORTISONE SOD SUC (PF) 100 MG IJ SOLR
100.0000 mg | Freq: Three times a day (TID) | INTRAMUSCULAR | Status: DC
  Administered 2023-09-14 – 2023-09-15 (×3): 100 mg via INTRAVENOUS
  Filled 2023-09-14 (×4): qty 2

## 2023-09-14 MED ORDER — ACETAMINOPHEN 325 MG PO TABS
650.0000 mg | ORAL_TABLET | Freq: Four times a day (QID) | ORAL | Status: DC | PRN
  Administered 2023-09-15: 650 mg via ORAL
  Filled 2023-09-14: qty 2

## 2023-09-14 MED ORDER — SODIUM CHLORIDE 0.9 % IV SOLN
2.0000 g | Freq: Once | INTRAVENOUS | Status: AC
  Administered 2023-09-14: 2 g via INTRAVENOUS
  Filled 2023-09-14: qty 12.5

## 2023-09-14 MED ORDER — VANCOMYCIN HCL IN DEXTROSE 1-5 GM/200ML-% IV SOLN
1000.0000 mg | Freq: Once | INTRAVENOUS | Status: AC
  Administered 2023-09-14: 1000 mg via INTRAVENOUS
  Filled 2023-09-14: qty 200

## 2023-09-14 MED ORDER — ONDANSETRON HCL 4 MG/2ML IJ SOLN: 4.0000 mg | Freq: Four times a day (QID) | INTRAMUSCULAR | Status: DC | PRN

## 2023-09-14 NOTE — ED Notes (Signed)
 Patient placed on purewick at this time.

## 2023-09-14 NOTE — Assessment & Plan Note (Signed)
 Creatinine 1.77 today with baseline creatinine around 1.1 Clinically dry-concern for prerenal etiology IV fluid hydration Hold nephrotoxic agents Monitor

## 2023-09-14 NOTE — Assessment & Plan Note (Signed)
Chronic, ongoing.  Followed by endocrinology, continue current regimen as ordered by them.  Recent notes reviewed. 

## 2023-09-14 NOTE — Assessment & Plan Note (Signed)
 Due to recent allergic reaction to Clindamycin for dental abscess.  Overall rash improved, but having pruritus due to remaining dry skin.  Recommend OTC Sarna lotion, daughter plans on purchasing this. Would avoid Benadryl  due to age, could trial Claritin or Zyrtec daily.

## 2023-09-14 NOTE — Assessment & Plan Note (Signed)
 Chronic, ongoing with A1c 6.9% one recent hospital check, urine ALB 29 March 2022, repeat next visit.  Taking Metformin  1000 MG BID and tolerating.  Recommend check BS fasting every morning and document for visits.  Recommend heavy focus on diabetic diet at home and regular exercise. - Eye exam up to date. Will get records. - Foot exam up to date - ARB and Statin on board - Pneumococcal and Flu vaccines up to date.

## 2023-09-14 NOTE — Assessment & Plan Note (Signed)
 Recent admission August 16 through 19 for issues including anaphylaxis and dress syndrome Appears clinically resolved at present No reported itching at present. Skin sloughing improved Status post steroid taper Monitor

## 2023-09-14 NOTE — Assessment & Plan Note (Signed)
 Blood sugar in 150s  SSI  A1C  Follow

## 2023-09-14 NOTE — Assessment & Plan Note (Signed)
 Appears to have acute on chronic adrenal sufficiency in the setting of syncopal event x 2 today Clinically dry Start stress dose Solu-Cortef  Monitor May need long-term bridge after discharge

## 2023-09-14 NOTE — Assessment & Plan Note (Signed)
 Overall in office today she remains weak and two syncopal episodes.  O2 sats did decrease slightly with movement from chair to exam table.  BP hypotensive, suspect related to hypovolemia with poor hydration and meal intake.  Sending to ER via EMS for further evaluation.  Suspect will need admission for IV hydration and hold BP medications.  May benefit from rehab bed at [facility] upon discharge prior to returning home to ensure increased strength.

## 2023-09-14 NOTE — H&P (Signed)
 History and Physical    Patient: Sarah [name] FMW:[id number] DOB: Jun 05, 1950 DOA: 09/14/2023 DOS: the patient was seen and examined on 09/14/2023 PCP: Valerio Melanie DASEN, NP  Patient coming from: Home  Chief Complaint:  Chief Complaint  Patient presents with   Weakness   HPI: Sarah [name] is a 73 y.o. female with medical history significant of T2DM with neuropathy, depression, adrenal insufficiency, malignant melanoma, chronic HFpEF, hyperlipidemia, stage III CKD with recent admission for anaphylaxis, dress syndrome, acute hypoxic respiratory failure with concern for pneumonia presenting with syncope.  Patient noted to have been admitted August 16 through August 19 for issues including anaphylaxis with concern for dress syndrome, pneumonia, demand STEMI as well as adrenal insufficiency.  Completed course of stress dose steroids in the setting of dress syndrome.  Per the daughter, patient has had minimal p.o. intake since being discharged.  Lives at local independent living facility.  Has otherwise medical compliant with home medications prior to the last admission.  Has been compliant with hydrocortisone  15 mg in the morning and 10 mg at night.  Went to the PCP office today for hospital follow-up.  Had 2 episodes of syncope going from sitting to standing.  Patient does report generalized weakness prior to the event.  No focal hemiparesis or confusion.  No reported falls or head trauma. Presented to the ER afebrile, blood pressures 100s to 120s over 50s to 60s.  Satting well on room air.  White count 15.7, hemoglobin 12, platelets 245, lactate 2.4-1.5.  Troponin 7-9.  D-dimer 0.6.  Creatinine 0.77. CXR stable.  Review of Systems: As mentioned in the history of present illness. All other systems reviewed and are negative. 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 She has never been exposed to tobacco smoke. She has never used smokeless tobacco. She reports that she does not currently use alcohol. She reports that she does not use drugs.  Allergies  Allergen Reactions   Clindamycin/Lincomycin Anaphylaxis   Doxycycline  Rash   Aspirin  Other (See Comments)    Pt is unsure of reaction, but states that she is allergic to aspirin    Ciprofloxacin Rash    very bad rash   Penicillins  Rash    Has patient had a PCN reaction causing immediate rash, facial/tongue/throat swelling, SOB or lightheadedness with hypotension: No Has patient had a PCN reaction causing severe rash involving mucus membranes or skin necrosis: No Has patient had a PCN reaction that required hospitalization: No Has patient had a PCN reaction occurring within the last 10 years: No If all of the above answers are NO, then may proceed with Cephalosporin use.     Family History  Problem Relation Age of Onset   Depression Mother    Anxiety disorder Mother    Bipolar disorder Mother    Diabetes Mother     Mental illness Mother    Heart disease Mother    Stroke Mother    Lung cancer Father    Diabetes Maternal Grandmother    Heart attack Maternal Grandmother    CVA Paternal Grandmother    Colon cancer Neg Hx    Liver cancer Neg Hx    Pancreatic cancer Neg Hx    Esophageal cancer Neg Hx    Stomach cancer Neg Hx    Rectal cancer Neg Hx    Colon polyps Neg Hx    Breast cancer Neg Hx     Prior to Admission medications   Medication Sig Start Date End Date Taking? Authorizing Provider  acetaminophen  (TYLENOL ) 500 MG tablet Take 1,000 mg by mouth every 8 (eight) hours as needed for mild pain (pain score 1-3) or moderate pain (pain score 4-6).    [provider]  amitriptyline  (ELAVIL ) 50 MG tablet Take 1 tablet (50 mg total) by mouth at bedtime. 07/13/23   [name], NP  amLODipine  (NORVASC ) 2.5 MG tablet Take 1 tablet (2.5 mg total) by mouth daily. 04/15/23   Cannady, [name] T, NP  Baclofen  5 MG TABS TAKE 1 TABLET BY MOUTH EVERY 8 HOURS AS NEEDED 11/13/22   Cannady, [name] T, NP  Blood Glucose Monitoring Suppl (ONETOUCH VERIO) w/Device KIT Use to check blood sugar 3 times a day and document results, bring to appointments.  Goal is <130 fasting blood sugar and <180 two hours after meals. 03/17/22   Cannady, [name] T, NP  Cholecalciferol (VITAMIN D ) 50 MCG (2000 UT) CAPS Take 1 capsule by mouth at bedtime.    [provider]  Cyanocobalamin (VITAMIN B-12 PO) Take 1 tablet by mouth at bedtime.    [provider]  diphenhydrAMINE  (BENADRYL ) 25 mg capsule Take 1 capsule (25 mg total) by mouth every 6 (six) hours as needed for itching. 09/07/23   Barbarann Nest, MD  EPINEPHrine  (EPIPEN  2-PAK) 0.3 mg/0.3 mL IJ SOAJ injection Inject 0.3 mg into the muscle as needed for anaphylaxis. 09/03/23   Wouk, Devaughn Sayres, MD  escitalopram  (LEXAPRO ) 10 MG tablet Take 1 tablet (10 mg total) by mouth daily. 07/13/23   [name], NP  FIBER ADULT GUMMIES PO Take 1 tablet by mouth daily  as needed.    [provider]  furosemide  (LASIX ) 40 MG tablet TAKE 1 TABLET BY MOUTH EVERY DAY 03/08/23   Cannady, [name] T, NP  HYDROcodone -acetaminophen  (NORCO/VICODIN) 5-325 MG tablet Take 1 tablet by mouth every 6 (six) hours as needed for moderate pain (pain score 4-6).    [provider]  hydrocortisone  (CORTEF ) 5 MG tablet TAKE THREE TABLETS IN THE MORNING AND ONE TABLET IN THE AFTERNOON 06/07/23   [provider]  losartan  (COZAAR ) 50 MG tablet Take 1 tablet (50 mg total) by mouth daily. 04/15/23   Cannady,  [name] T, NP  meclizine  (ANTIVERT ) 25 MG tablet Take 1 tablet (25 mg total) by mouth 3 (three) times daily as needed for dizziness. 04/28/22   Cannady, [name] T, NP  metFORMIN  (GLUCOPHAGE ) 1000 MG tablet Take 1 tablet (1,000 mg total) by mouth 2 (two) times daily with a meal. 04/15/23   Cannady, [name] T, NP  OneTouch UltraSoft 2 Lancets MISC USE TO CHECK BLOOD SUGAR 3 TIMES A DAY AND DOCUMENT RESULTS, BRING TO APPOINTMENTS. GOAL IS <130 FASTING BLOOD SUGAR AND <180 TWO HOURS AFTER MEALS. 03/23/23   Cannady, [name] T, NP  pregabalin  (LYRICA ) 150 MG capsule 150 mg in am, 150 MG in afternoon, and 300 mg in pm    [provider]  rosuvastatin  (CRESTOR ) 10 MG tablet Take 1 tablet (10 mg total) by mouth daily. 04/15/23   Cannady, [name] T, NP  Suvorexant  (BELSOMRA ) 20 MG TABS Take 1 tablet (20 mg total) by mouth at bedtime. 07/13/23   [name], NP    Physical Exam: Vitals:   09/14/23 1330 09/14/23 1400 09/14/23 1430 09/14/23 1601  BP: 103/60 122/62 (!) 128/57   Pulse: 70 67 68   Resp: 16 16 15    Temp:    98 F (36.7 C)  TempSrc:    Oral  SpO2: 96% 93% 96%   Weight:      Height:       Physical Exam Constitutional:      Appearance: She is obese.  HENT:     Head: Normocephalic and atraumatic.     Nose: Nose normal.     Mouth/Throat:     Mouth: Mucous membranes are dry.  Eyes:     Pupils: Pupils are equal, round, and reactive to light.   Cardiovascular:     Rate and Rhythm: Normal rate and regular rhythm.  Pulmonary:     Effort: Pulmonary effort is normal.  Abdominal:     General: Bowel sounds are normal.  Musculoskeletal:     Comments: + mild generalized weakness    Skin:    General: Skin is dry.  Neurological:     General: No focal deficit present.  Psychiatric:        Mood and Affect: Mood normal.     Data Reviewed:  There are no new results to review at this time.  DG Chest Port 1 View EXAM: 1 VIEW XRAY OF THE CHEST 09/14/2023 12:33:12 PM  COMPARISON: 09/04/2023  CLINICAL HISTORY: 141880 SOB (shortness of breath) 141880. two syncopal episodes while at her doctor's office today. Pt with no reports of hitting her head. Pt has been feeling weak the past week. Pt was was discharged from the hospital a week ago with pnx. Pt with complaints of chronic pain in her back 7/10. ; hypotensive  FINDINGS:  LUNGS AND PLEURA: Atelectasis versus scar noted in the right lung base. No pulmonary edema. No pleural effusion. No pneumothorax.  HEART AND MEDIASTINUM: No acute abnormality of the cardiac and mediastinal silhouettes.  BONES AND SOFT TISSUES: No acute osseous abnormality.  IMPRESSION: 1. Atelectasis versus scar in the right lung base.  Electronically signed by: Waddell Calk MD 09/14/2023 01:08 PM EDT RP Workstation: [id number]  Lab Results  Component Value Date   WBC 15.7 (H) 09/14/2023   HGB 12.0 09/14/2023   HCT 36.8 09/14/2023   MCV 89.8 09/14/2023   PLT 245 09/14/2023   Last metabolic panel Lab Results  Component Value Date   GLUCOSE 155 (H) 09/14/2023   NA 143  09/14/2023   K 3.7 09/14/2023   CL 98 09/14/2023   CO2 30 09/14/2023   BUN 20 09/14/2023   CREATININE 1.77 (H) 09/14/2023   GFRNONAA 30 (L) 09/14/2023   CALCIUM  9.1 09/14/2023   PHOS 2.3 (L) 09/02/2023   PROT 6.3 (L) 09/05/2023   ALBUMIN 2.5 (L) 09/05/2023   LABGLOB 2.0 04/15/2023   AGRATIO 1.9 06/17/2022   BILITOT  1.2 09/05/2023   ALKPHOS 85 09/05/2023   AST 65 (H) 09/05/2023   ALT 80 (H) 09/05/2023   ANIONGAP 15 09/14/2023     Assessment and Plan: * Syncope Noted syncopal episodes x 2 with outpatient PCP follow-up today Symptoms predominantly going from sitting to standing Markedly dry in exam today Suspect multifactorial contributions of dehydration, adrenal insufficiency Unable to tolerate orthostatics at present Stress dose Solu-Cortef  Noted 2D ECHO 08/2023 w/ EF 55-60% and grade 2 diastolic dysfunction  Check MRI of the brain  Gentle IVF hydration in setting of HFpEF  Fall precautions PT OT evaluation Consider midodrine as appropriate Monitor  Leukocytosis White count 15 on presentation Suspect likely secondary to recent steroid use No overt signs of sepsis or SIRS at present though with noted syncopal event x 2 Lactate of 2.3 Status post broad-spectrum antibiotics in the ER Will panculture Otherwise trend with treatment Monitor   Acute kidney injury superimposed on chronic kidney disease (HCC) Creatinine 1.77 today with baseline creatinine around 1.1 Clinically dry-concern for prerenal etiology IV fluid hydration Hold nephrotoxic agents Monitor   DRESS syndrome Recent admission August 16 through 19 for issues including anaphylaxis and dress syndrome Appears clinically resolved at present No reported itching at present. Skin sloughing improved Status post steroid taper Monitor  Acute hypoxic respiratory failure (HCC) Noted recent admission for acute respiratory failure with hypoxia in the setting of pneumonia-resolved No active hypoxia present Chest x-ray within normal limits Monitor  (HFpEF) heart failure with preserved ejection fraction (HCC) 2D echo August 2025 with EF of 55 to 60% and grade 2 diastolic dysfunction Appears clinically dry in setting of syncopal event x 2 Weight today 102.1 kg Gentle IV fluid hydration Otherwise hold offending  medications Titrate appropriately Monitor  Adrenal insufficiency (Addison's disease) (HCC) Appears to have acute on chronic adrenal sufficiency in the setting of syncopal event x 2 today Clinically dry Start stress dose Solu-Cortef  Monitor May need long-term bridge after discharge  DM2 (diabetes mellitus, type 2) (HCC) Blood sugar in 150s  SSI  A1C  Follow    Hypertension associated with diabetes (HCC) Noted orthostatic symptoms on presentation  Hold BP regimen for now  Follow        Advance Care Planning:   Code Status: Full Code   Consults: None   Family Communication: Daughter at the bedside   Severity of Illness: The appropriate patient status for this patient is OBSERVATION. Observation status is judged to be reasonable and necessary in order to provide the required intensity of service to ensure the patient's safety. The patient's presenting symptoms, physical exam findings, and initial radiographic and laboratory data in the context of their medical condition is felt to place them at decreased risk for further clinical deterioration. Furthermore, it is anticipated that the patient will be medically stable for discharge from the hospital within 2 midnights of admission.   Author: Elspeth JINNY Masters, MD 09/14/2023 4:15 PM  For on call review [email or web address].

## 2023-09-14 NOTE — Progress Notes (Signed)
 CODE SEPSIS - PHARMACY COMMUNICATION  **Broad Spectrum Antibiotics should be administered within 1 hour of Sepsis diagnosis**  Time Code Sepsis Called/Page Received: 1514  Antibiotics Ordered: Cefepime , vancomycin , metronidazole   Time of 1st antibiotic administration: 1608  Additional action taken by pharmacy: N/A  Sarah [name] 09/14/2023  3:15 PM

## 2023-09-14 NOTE — Progress Notes (Signed)
 IVT consult placed for 2nd PIV access.  This RN to bedside at 1637 and pt was OTF to MRI.  [place] to ED RN and was notified that patient would be going to the floor following MRI.  Pt noted to be OTF until 1746.  Went to bedside and spoke with admitting RN, no additional access needed at this time. Consult cleared.

## 2023-09-14 NOTE — Assessment & Plan Note (Addendum)
 2D echo August 2025 with EF of 55 to 60% and grade 2 diastolic dysfunction Appears clinically dry in setting of syncopal event x 2 Weight today 102.1 kg Gentle IV fluid hydration Otherwise hold offending medications Titrate appropriately Monitor

## 2023-09-14 NOTE — Assessment & Plan Note (Signed)
 Noted orthostatic symptoms on presentation  Hold BP regimen for now  Follow

## 2023-09-14 NOTE — Assessment & Plan Note (Signed)
 CBC in office with elevation in WBC, recent PNA.  Returning to ER for further assessment.

## 2023-09-14 NOTE — ED Triage Notes (Signed)
 Pt comes in via [facility] after having two syncopal episodes while at her doctor's office today. Pt with no reports of hitting her head. Pt has been feeling weak the past week. Pt was was discharged from the hospital a week ago with pnx. Pt with complaints of chronic pain in her back 7/10. Pt is alert and oriented x4, and hypotensive in triage.

## 2023-09-14 NOTE — Assessment & Plan Note (Signed)
 One with brief LOC/no injury and second near syncopal episode.  EKG reassuring in office. BP hypotensive, suspect related to hypovolemia with poor hydration and meal intake.  Sending to ER via EMS for further evaluation.  Suspect will need admission for IV hydration and hold BP medications.  May benefit from rehab bed at [facility] upon discharge prior to returning home to ensure increased strength.

## 2023-09-14 NOTE — Assessment & Plan Note (Signed)
 White count 15 on presentation Suspect likely secondary to recent steroid use No overt signs of sepsis or SIRS at present though with noted syncopal event x 2 Lactate of 2.3 Status post broad-spectrum antibiotics in the ER Will panculture Otherwise trend with treatment Monitor

## 2023-09-14 NOTE — ED Notes (Signed)
 This RN and Amy RN were unable to draw blood culture sets due to patient being a hard stick. IV team consult placed. Antibiotics started.

## 2023-09-14 NOTE — Assessment & Plan Note (Addendum)
 Acute. Overall lung sounds reassuring today, however patient remains very weak and had two syncopal episodes in office. Have recommended return to ER.  EMS to office to assist.

## 2023-09-14 NOTE — Assessment & Plan Note (Signed)
BMI 38.13.  Recommended eating smaller high protein, low fat meals more frequently and exercising 30 mins a day 5 times a week with a goal of 10-15lb weight loss in the next 3 months. Patient voiced their understanding and motivation to adhere to these recommendations.  

## 2023-09-14 NOTE — ED Provider Notes (Signed)
 [facility] Provider Note    Event Date/Time   First MD Initiated Contact with Patient 09/14/23 1220     (approximate)   History   Weakness   HPI  Sarah [name] is a 73 y.o. female past medical history significant for diabetes, neuropathy, malignant melanoma, adrenal insufficiency, hyperlipidemia, CKD, mood disorder, hypertension, who presents to the emergency department for weakness.  States that she has been having weakness and fatigue since she was discharged from the hospital.  Also complaining of shortness of breath.  Went to have a follow-up visit with her primary care physician today and had 2 syncopal episodes which brought her into the emergency department.  States that she was feeling poorly prior to her visit.  Whenever she went to stand to go to the table had an episode of syncope, denies hitting her head.  Quickly returned back to her normal.  Had a second episode of syncope when she went to stand and only the office which brought her into the emergency department.  Denies any chest pain but does endorse shortness of breath.    Patient had a recent hospitalization and was discharged on 09/07/2023 -patient had a recent hospitalization with an ICU and anaphylactic shock from clindamycin.  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  Had new infiltrates on CT scan of the chest at that time and was febrile, was treated with cefepime .  History of adrenal insufficiency     Physical Exam   Triage Vital Signs: ED Triage Vitals  Encounter Vitals Group     BP 09/14/23 1220 (!) 112/59     Girls Systolic BP Percentile --      Girls Diastolic BP Percentile --      Boys Systolic BP Percentile --      Boys Diastolic BP Percentile --      Pulse Rate 09/14/23 1205 75     Resp 09/14/23 1205 18     Temp 09/14/23 1205 98.6 F (37 C)     Temp src --      SpO2 09/14/23 1205 93 %     Weight 09/14/23 1206 225 lb (102.1 kg)     Height 09/14/23 1206 5' 4 (1.626 m)      Head Circumference --      Peak Flow --      Pain Score 09/14/23 1206 7     Pain Loc --      Pain Education --      Exclude from Growth Chart --     Most recent vital signs: Vitals:   09/14/23 1400 09/14/23 1430  BP: 122/62 (!) 128/57  Pulse: 67 68  Resp: 16 15  Temp:    SpO2: 93% 96%    Physical Exam Constitutional:      Appearance: She is well-developed.  HENT:     Head: Atraumatic.  Eyes:     Extraocular Movements: Extraocular movements intact.     Conjunctiva/sclera: Conjunctivae normal.     Pupils: Pupils are equal, round, and reactive to light.  Cardiovascular:     Rate and Rhythm: Regular rhythm.  Pulmonary:     Effort: No respiratory distress.     Breath sounds: No wheezing.  Abdominal:     General: There is no distension.     Tenderness: There is no abdominal tenderness.  Musculoskeletal:        General: Normal range of motion.     Cervical back: Normal range of motion.     Right  lower leg: No edema.     Left lower leg: No edema.  Skin:    General: Skin is warm.     Capillary Refill: Capillary refill takes less than 2 seconds.  Neurological:     General: No focal deficit present.     Mental Status: She is alert. Mental status is at baseline.     IMPRESSION / MDM / ASSESSMENT AND PLAN / ED COURSE  I reviewed the triage vital signs and the nursing notes.  Differential diagnosis including dehydration, electrolyte abnormality, adrenal insufficiency, pneumonia, urinary tract infection, dysrhythmia, ACS, anemia  Attempted orthostatic blood pressures however patient became significantly symptomatic and had to sit back down, unable to get a standing orthostatic blood pressure  On chart review patient is on chronic steroids and has a history of adrenal insufficiency.  Will give a 500 bolus of IV fluids and reevaluate.  Will give a dose of IV Solu-Cortef  of 100 mg given her low blood pressure.  Blood cultures obtained and will Taine a lactic acid.   EKG  I,  Clotilda Punter, the attending physician, personally viewed and interpreted this ECG.  EKG showed normal sinus rhythm.  Left axis deviation.  QRS 120 QTc 46.  No significant change when compared to prior EKG.  No findings of acute ischemia or dysrhythmia.  No tachycardic or bradycardic dysrhythmias while on cardiac telemetry.  RADIOLOGY I independently reviewed imaging, my interpretation of imaging: Chest x-ray no signs of an pneumonia  LABS (all labs ordered are listed, but only abnormal results are displayed) Labs interpreted as -    Labs Reviewed  BASIC METABOLIC PANEL WITH GFR - Abnormal; Notable for the following components:      Result Value   Glucose, Bld 155 (*)    Creatinine, Ser 1.77 (*)    GFR, Estimated 30 (*)    All other components within normal limits  CBC - Abnormal; Notable for the following components:   WBC 15.7 (*)    All other components within normal limits  LACTIC ACID, PLASMA - Abnormal; Notable for the following components:   Lactic Acid, Venous 2.4 (*)    All other components within normal limits  D-DIMER, QUANTITATIVE - Abnormal; Notable for the following components:   D-Dimer, Quant 0.60 (*)    All other components within normal limits  MAGNESIUM  - Abnormal; Notable for the following components:   Magnesium  1.6 (*)    All other components within normal limits  CULTURE, BLOOD (SINGLE)  CULTURE, BLOOD (SINGLE)  CULTURE, BLOOD (SINGLE)  PROTIME-INR  BRAIN NATRIURETIC PEPTIDE  LACTIC ACID, PLASMA  URINALYSIS, W/ REFLEX TO CULTURE (INFECTION SUSPECTED)  HEPATIC FUNCTION PANEL  TROPONIN I (HIGH SENSITIVITY)  TROPONIN I (HIGH SENSITIVITY)     MDM  Patient has leukocytosis of 15 which is increased from when she was discharged from the hospital also found to have an acute kidney injury at 1.7 with a normal BUN of 20.  No significant electrolyte abnormality.  Age-adjusted D-dimer is negative.  Lactic acid elevated at 2.4.  Troponin is negative have low  suspicion for ACS.  Normal BNP.  Have a lower suspicion for heart failure exacerbation and no significant edema on chest x-ray.  Magnesium  is low at 1.6 given IV magnesium  replacement.  Normal potassium.  Chest x-ray with no findings of pneumonia.  Read as atelectasis versus scar to the right lung base.  After discussion with the hospitalist wanted to go ahead and start some broad-spectrum antibiotics given her leukocytosis,  elevated lactic acid, low blood pressure and tachypnea.  Will give 1 dose of broad-spectrum antibiotics to treat unknown source.  Has not yet given us  a urine.  Does appear significantly dehydrated with her acute kidney injury.  Will give another 1 L of IV fluids.     PROCEDURES:  Critical Care performed: yes  .Critical Care  Performed by: Suzanne Kirsch, MD Authorized by: Suzanne Kirsch, MD   Critical care provider statement:    Critical care time (minutes):  30   Critical care time was exclusive of:  Separately billable procedures and treating other patients   Critical care was necessary to treat or prevent imminent or life-threatening deterioration of the following conditions:  Circulatory failure   Critical care was time spent personally by me on the following activities:  Development of treatment plan with patient or surrogate, discussions with consultants, evaluation of patient's response to treatment, examination of patient, ordering and review of laboratory studies, ordering and review of radiographic studies, ordering and performing treatments and interventions, pulse oximetry, re-evaluation of patient's condition and review of old charts   Care discussed with: admitting provider     Patient's presentation is most consistent with acute presentation with potential threat to life or bodily function.   MEDICATIONS ORDERED IN ED: Medications  lactated ringers  infusion (has no administration in time range)  ceFEPIme  (MAXIPIME ) 2 g in sodium chloride  0.9 % 100 mL  IVPB (has no administration in time range)  metroNIDAZOLE  (FLAGYL ) IVPB 500 mg (has no administration in time range)  vancomycin  (VANCOCIN ) IVPB 1000 mg/200 mL premix (has no administration in time range)  enoxaparin  (LOVENOX ) injection 50 mg (has no administration in time range)  ondansetron  (ZOFRAN ) tablet 4 mg (has no administration in time range)    Or  ondansetron  (ZOFRAN ) injection 4 mg (has no administration in time range)  acetaminophen  (TYLENOL ) tablet 650 mg (has no administration in time range)  multivitamin with minerals tablet 1 tablet (has no administration in time range)  sodium chloride  flush (NS) 0.9 % injection 3 mL (has no administration in time range)  lactated ringers  infusion (has no administration in time range)  LORazepam  (ATIVAN ) injection 0.5 mg (has no administration in time range)  sodium chloride  0.9 % bolus 500 mL (0 mLs Intravenous Stopped 09/14/23 1347)  magnesium  sulfate IVPB 1 g 100 mL (0 g Intravenous Stopped 09/14/23 1446)  hydrocortisone  sodium succinate  (SOLU-CORTEF ) 100 MG injection 100 mg (100 mg Intravenous Given 09/14/23 1346)  sodium chloride  0.9 % bolus 500 mL (0 mLs Intravenous Stopped 09/14/23 1446)    FINAL CLINICAL IMPRESSION(S) / ED DIAGNOSES   Final diagnoses:  Weakness  Dehydration  Syncope, unspecified syncope type  AKI (acute kidney injury) (HCC)     Rx / DC Orders   ED Discharge Orders     None        Note:  This document was prepared using Dragon voice recognition software and may include unintentional dictation errors.   Suzanne Kirsch, MD 09/14/23 1558

## 2023-09-14 NOTE — Assessment & Plan Note (Signed)
 Noted recent admission for acute respiratory failure with hypoxia in the setting of pneumonia-resolved No active hypoxia present Chest x-ray within normal limits Monitor

## 2023-09-14 NOTE — Assessment & Plan Note (Addendum)
 Noted syncopal episodes x 2 with outpatient PCP follow-up today Symptoms predominantly going from sitting to standing Markedly dry in exam today Suspect multifactorial contributions of dehydration, adrenal insufficiency Unable to tolerate orthostatics at present Stress dose Solu-Cortef  Noted 2D ECHO 08/2023 w/ EF 55-60% and grade 2 diastolic dysfunction  Check MRI of the brain  Gentle IVF hydration in setting of HFpEF  Fall precautions PT OT evaluation Consider midodrine as appropriate Monitor

## 2023-09-14 NOTE — Sepsis Progress Note (Signed)
 Elink monitoring for the code sepsis protocol.

## 2023-09-14 NOTE — Assessment & Plan Note (Signed)
 Suspect low BP related to poor oral intake, food and fluids. Plus she has been taking all of her BP medications. Would recommend holding these. However, at this time due to her syncopal episodes and weakness recommend she return to ER. EMS called to assist with transfer.  Both patient and daughter in agreeance with this plan due to her worsening weakness. Suspect will need IV therapy and BP medications held until vitals improve.

## 2023-09-14 NOTE — Progress Notes (Signed)
 BP 91/61   Pulse 76   Temp 98.7 F (37.1 C) (Oral)   Ht 5' 4.5 (1.638 m)   Wt 225 lb 9.6 oz (102.3 kg)   SpO2 97%   BMI 38.13 kg/m    Subjective:    Patient ID: Sarah [name], female    DOB: May 06, 1950, 73 y.o.   MRN: [id number]  HPI: Sarah [name] is a 73 y.o. female  Chief Complaint  Patient presents with   Hospitalization Follow-up   Presents with daughter, [name], to assist with HPI.   Transition of Care Hospital Follow up.  Presented to ER on 09/04/23 due to chest pain and SOB.  Was admitted prior to this, 08/30/23, for allergic reaction to Clindamycin after a dental procedure, root canal.  Has abscessed tooth that is still present. Rash presented with this, the day she started taking script. Rash remains present, this is improving now just peeling and itching, but less red.  Continues to have some SOB, this started on second admission with possible pneumonia.  This morning had some chest discomfort, this lasted over 30 minutes. With this she became more short of breath and sweaty.  Daughter reports she has been really shaky and really weak.  PT was supposed to come yesterday, but she was barely able to get out of bed.  Continues to be very fatigued, this has been worsening since her discharge on 09/07/23.  Is more tired, weak, and more pain in the chest area.  Pain comes and goes/presents with sweating and SOB.  No pain with deep breathing, coughing, and no hemoptysis.    Currently is not drinking much fluid in a day, maybe 4 oz per her daughter's report.  Eating one meal a day, small meal.  Not feeling hungry.  Continues to take her HTN medications: Amlodipine . Losartan , and Lasix .  Not checking BP at home. Taking Metformin , not checking BS at home.  Pain today 7/10 to lower back.    Hospital Course: 73yo with h/o T2DM with neuropathy, depression, and malignant melanoma who presented on 8/16 with respiratory distress.  She was previously hospitalized from 8/11-15 with  anaphylactic shock following recent clindamycin following a root canal, requiring ICU admission and treatment with pressors and corticosteroids.  Rash persisted but hypotension resolved.  Her rash worsened and EMS found her O2 sats in the 70s and started 6L  O2.  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  Echo with grade 2 diastolic dysfunction.    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 with fever, previous facial edema, lymphadenopathy, rash and organ involvement Patient has a RegiSCAR for Dress of 3 (Possible) given enlarged lymph node seen on CT with atypical lymphocytes, eosinophilia, and extensive skin rash with edema and purpura Checking LDH (elevated), ferritin (normal), CRP (elevated) US  with hepatomegaly, fatty liver, no splenomegaly Echo reassuring No evidence of any CNS involvement No necrosis to think TENS or SJS although she is having some sloughing of the rash on her chest today Treated with methylprednisolone  250mg  BID  Transition to prednisone  taper 60 x 2 days and then down by 10 mg q2days until off Urgent f/u with allergy [facility])   Acute hypoxic respiratory failure  Acute hypoxic respiratory failure, O2 levels in 70s with only mild cough and  no SOB or other respiratory symptoms CT with new bilateral effusions, edema versus pneumonia, suspect primarily a volume overload or Dress-induced interstitial pneumonitis IV Lasix  40 mg x 1 -> home Lasix  Echocardiogram with preserved EF and grade 2 DD Weaned to room air   Possible pneumonia  Recent hospitalization New infiltrates on CT and chest x-ray Febrile to 101.3 with leukocytosis to 17.1 on presentation Procalcitonin of 0.25 and improving WBC trend  (19.8 from 25.8 previously) Recent high-dose steroids complicate clinical picture No current concern for sepsis Continue cefepime   Patient has indwelling back hardware although says back pain is at baseline and has no focal neurology so not imaging for now Blood and sputum cultures pending  MRSA PCR is negative so will dc Vanc for now Appears likely to be reactive rather than infectious, but she should likely    Nonischemic nontraumatic myocardial injury Nontraumatic nonischemic myocardial injury Troponin 90, 114 Echo with preserved EF, grade 2 DD Cardiology consulted by telephone, low suspicion for ACS No ASA due to allergy Given IV Lasix    Adrenal insufficiency  Holding the patient's home hydrocortisone  given she is on steroid taper   Transaminitis Elevated LFTs, slightly worse this AM Acute Hep Panel negative RUQ with fatty liver Given <3x upper limit, will continue statin   DM2 (diabetes mellitus, type 2)  A1c 6.9, good control Resume Glucophage  Carb modified diet    HLD (hyperlipidemia) Continue Crestor     Stage 3a CKD Appears to be stable at this time Attempt to avoid nephrotoxic medications   Essential hypertension Resume Cozaar  and amlodipine     Mood disorder (HCC) Continue amitriptyline  and escitalopram     Class 2 obesity Body mass index is 39.82 kg/m.SABRA  Weight loss should be encouraged Outpatient PCP/bariatric medicine f/u encouraged Significantly low or high BMI is associated with higher medical risk including morbidity and mortality     Generalized weakness Reported weakness this AM at time of evaluation She was standing independently at the sink cleaning herself up PT/OT ordered She is recommended for outpatient PT/OT at [facility] walker ordered  Hospital[facility]Facility: [facility] D/C Physician: Dr. Barbarann D/C Date: 09/07/23  Records Requested: 09/14/23 Records Received: 09/14/23 Records Reviewed: 09/14/23  Diagnoses on Discharge: DRESS  Syndrome, Acute hypoxic respiratory failure, Gram negative pneumonia  Date of interactive Contact within 48 hours of discharge:  Contact was through: none  Date of 7 day or 14 day face-to-face visit:    within 7 days  No facility-administered encounter medications on file as of 09/14/2023.   Outpatient Encounter Medications as of 09/14/2023  Medication Sig   acetaminophen  (TYLENOL ) 500 MG tablet Take 1,000 mg by mouth every 8 (eight) hours as needed for mild pain (pain score 1-3) or moderate pain (pain score 4-6).   amitriptyline  (ELAVIL ) 50 MG tablet Take 1 tablet (50 mg total) by mouth at bedtime.   amLODipine  (NORVASC ) 2.5 MG tablet Take 1 tablet (2.5 mg total) by mouth daily.   Baclofen  5 MG TABS TAKE 1 TABLET BY MOUTH EVERY 8 HOURS AS NEEDED   Blood Glucose Monitoring Suppl (ONETOUCH VERIO) w/Device KIT Use to check blood sugar 3 times a day and document results, bring to appointments.  Goal is <130 fasting blood sugar and <180 two hours after meals.   Cholecalciferol (VITAMIN D ) 50 MCG (2000 UT) CAPS Take 1 capsule by mouth at bedtime.   Cyanocobalamin (VITAMIN B-12 PO) Take 1 tablet by mouth at bedtime.   diphenhydrAMINE  (BENADRYL ) 25 mg capsule Take 1 capsule (25  mg total) by mouth every 6 (six) hours as needed for itching.   EPINEPHrine  (EPIPEN  2-PAK) 0.3 mg/0.3 mL IJ SOAJ injection Inject 0.3 mg into the muscle as needed for anaphylaxis.   escitalopram  (LEXAPRO ) 10 MG tablet Take 1 tablet (10 mg total) by mouth daily.   FIBER ADULT GUMMIES PO Take 1 tablet by mouth daily as needed.   furosemide  (LASIX ) 40 MG tablet TAKE 1 TABLET BY MOUTH EVERY DAY   HYDROcodone -acetaminophen  (NORCO/VICODIN) 5-325 MG tablet Take 1 tablet by mouth every 6 (six) hours as needed for moderate pain (pain score 4-6).   hydrocortisone  (CORTEF ) 5 MG tablet TAKE THREE TABLETS IN THE MORNING AND ONE TABLET IN THE AFTERNOON   losartan  (COZAAR ) 50 MG tablet Take 1 tablet (50 mg total) by mouth daily.   meclizine   (ANTIVERT ) 25 MG tablet Take 1 tablet (25 mg total) by mouth 3 (three) times daily as needed for dizziness.   metFORMIN  (GLUCOPHAGE ) 1000 MG tablet Take 1 tablet (1,000 mg total) by mouth 2 (two) times daily with a meal.   OneTouch UltraSoft 2 Lancets MISC USE TO CHECK BLOOD SUGAR 3 TIMES A DAY AND DOCUMENT RESULTS, BRING TO APPOINTMENTS. GOAL IS <130 FASTING BLOOD SUGAR AND <180 TWO HOURS AFTER MEALS.   pregabalin  (LYRICA ) 150 MG capsule 150 mg in am, 150 MG in afternoon, and 300 mg in pm   rosuvastatin  (CRESTOR ) 10 MG tablet Take 1 tablet (10 mg total) by mouth daily.   Suvorexant  (BELSOMRA ) 20 MG TABS Take 1 tablet (20 mg total) by mouth at bedtime.   [DISCONTINUED] Blood Glucose Monitoring Suppl (ACCU-CHEK GUIDE) w/Device KIT To check blood sugar 2-3 times daily and document for visits.   [DISCONTINUED] predniSONE  (DELTASONE ) 10 MG tablet Take 6 tablets (60 mg total) by mouth daily for 3 days, THEN 5 tablets (50 mg total) daily for 3 days, THEN 4 tablets (40 mg total) daily for 3 days, THEN 3 tablets (30 mg total) daily for 3 days, THEN 2 tablets (20 mg total) daily for 3 days.    Diagnostic Tests Reviewed/Disposition:     Latest Ref Rng & Units 09/14/2023   12:09 PM 09/14/2023   10:45 AM 09/07/2023    5:30 AM  CBC  WBC 4.0 - 10.5 K/uL 15.7  11.8  11.4   Hemoglobin 12.0 - 15.0 g/dL 87.9  87.6  9.6   Hematocrit 36.0 - 46.0 % 36.8  36.7  29.7   Platelets 150 - 400 K/uL 245  235  184        Latest Ref Rng & Units 09/14/2023   12:09 PM 09/07/2023    5:30 AM 09/06/2023    5:34 AM  CMP  Glucose 70 - 99 mg/dL 844  823  790   BUN 8 - 23 mg/dL 20  37  28   Creatinine 0.44 - 1.00 mg/dL 8.22  9.00  9.02   Sodium 135 - 145 mmol/L 143  141  141   Potassium 3.5 - 5.1 mmol/L 3.7  3.8  3.9   Chloride 98 - 111 mmol/L 98  107  106   CO2 22 - 32 mmol/L 30  24  23    Calcium  8.9 - 10.3 mg/dL 9.1  8.0  7.8     Consults: PT/OT  Discharge Instructions:  You are being discharged with home physical and  occupational therapy at [facility] Take prednisone  taper as prescribed; resume hydrocortisone  once taper is completed Urgent referral placed to allergist Follow up with  NP Jaziel Bennett in 1-2 weeks  Disease/illness Education: Reviewed at length with patient and daughter  Home Health/Community Services Discussions/Referrals: Ordered but was unable to do PT recently due to weakness and is to have OT Monday  Establishment or re-establishment of referral orders for community resources: none  Discussion with other health care providers: Reviewed all recent notes  Assessment and Support of treatment regimen adherence: Reviewed at length with patient and daughter  Appointments Coordinated with: Reviewed at length with patient and daughter  Education for self-management, independent living, and ADLs:  Reviewed at length with patient and daughter  Relevant past medical, surgical, family and social history reviewed and updated as indicated. Interim medical history since our last visit reviewed. Allergies and medications reviewed and updated.  Review of Systems  Constitutional:  Positive for activity change, appetite change, fatigue and unexpected weight change. Negative for diaphoresis and fever.  Respiratory:  Positive for cough, chest tightness and shortness of breath. Negative for wheezing.   Cardiovascular:  Positive for chest pain and palpitations (all the time). Negative for leg swelling.  Gastrointestinal:  Negative for abdominal distention, abdominal pain, constipation, diarrhea, nausea and vomiting.  Endocrine: Negative for polydipsia, polyphagia and polyuria.  Musculoskeletal:  Positive for back pain (sees Dr. Colon this Friday). Negative for myalgias.  Neurological:  Positive for dizziness, weakness and light-headedness. Negative for tremors, speech difficulty, numbness and headaches.   Per HPI unless specifically indicated above     Objective:    BP 91/61   Pulse 76   Temp 98.7 F  (37.1 C) (Oral)   Ht 5' 4.5 (1.638 m)   Wt 225 lb 9.6 oz (102.3 kg)   SpO2 97%   BMI 38.13 kg/m   Wt Readings from Last 3 Encounters:  09/14/23 225 lb (102.1 kg)  09/14/23 225 lb 9.6 oz (102.3 kg)  09/04/23 232 lb (105.2 kg)    Physical Exam Vitals and nursing note reviewed.  Constitutional:      General: She is awake. She is not in acute distress.    Appearance: She is well-developed and well-groomed. She is obese. She is ill-appearing. She is not toxic-appearing.  HENT:     Head: Normocephalic.     Right Ear: Hearing and external ear normal.     Left Ear: Hearing and external ear normal.  Eyes:     General: Lids are normal.        Right eye: No discharge.        Left eye: No discharge.     Extraocular Movements: Extraocular movements intact.     Conjunctiva/sclera: Conjunctivae normal.     Pupils: Pupils are equal, round, and reactive to light.  Neck:     Thyroid : No thyromegaly.     Vascular: No carotid bruit.  Cardiovascular:     Rate and Rhythm: Normal rate and regular rhythm.     Heart sounds: Normal heart sounds. No murmur heard.    No gallop.  Pulmonary:     Effort: Pulmonary effort is normal. No accessory muscle usage or respiratory distress.     Breath sounds: Normal breath sounds. No decreased breath sounds, wheezing or rales.  Abdominal:     General: Bowel sounds are normal. There is no distension.     Palpations: Abdomen is soft.     Tenderness: There is no abdominal tenderness.  Musculoskeletal:     Cervical back: Normal range of motion and neck supple.     Right lower leg: No edema.  Left lower leg: No edema.  Lymphadenopathy:     Cervical: No cervical adenopathy.  Skin:    General: Skin is warm and dry.     Capillary Refill: Capillary refill takes 2 to 3 seconds.     Coloration: Skin is pale.     Comments: Dry, scaling skin to area of rash upper and lower body.  Neurological:     Mental Status: She is alert and oriented to person, place, and  time.     Cranial Nerves: Cranial nerves 2-12 are intact.     Motor: Weakness present.     Deep Tendon Reflexes:     Reflex Scores:      Brachioradialis reflexes are 1+ on the right side and 1+ on the left side.      Patellar reflexes are 1+ on the right side and 1+ on the left side.    Comments: Weakness noted on exam, very unsteady gait when moving from chair to exam table and chair to walker.  One syncopal episode when moving from chair and getting up onto table, once sitting started to shake slightly and short period LOC -- ammonia used and no injury. Daughter held patient up. BP 120/74 and HR 86, O2 sat 93% at the time. Lasted a few seconds and spontaneous return consciousness. Second episode near syncopal when standing up to use walker and leave room. Legs started to become wobbly and able to get patient to chair immediately. No injuries.   Psychiatric:        Attention and Perception: Attention normal.        Mood and Affect: Mood normal.        Speech: Speech normal.        Behavior: Behavior normal. Behavior is cooperative.        Thought Content: Thought content normal.    Diabetic Foot Exam - Simple   Simple Foot Form Visual Inspection See comments: Yes Sensation Testing Intact to touch and monofilament testing bilaterally: Yes Pulse Check Posterior Tibialis and Dorsalis pulse intact bilaterally: Yes Comments Dry skin to both feet     EKG My review and personal interpretation at Time: 0950 Indication: chest pain Rate: 75  Rhythm: sinus Axis: left shift Other: No nonspecific st abn, no stemi, no lvh. LBBB present.   Results for orders placed or performed in visit on 09/14/23  CBC (STAT)   Collection Time: 09/14/23 10:45 AM  Result Value Ref Range   WBC 11.8 (H) 3.4 - 10.8 x10E3/uL   RBC 4.18 3.77 - 5.28 x10E6/uL   Hemoglobin 12.3 11.1 - 15.9 g/dL   Hematocrit 63.2 65.9 - 46.6 %   MCV 88 79 - 97 fL   MCH 29.4 26.6 - 33.0 pg   MCHC 33.5 31.5 - 35.7 g/dL   RDW 85.7  88.2 - 84.5 %   Platelets 235 150 - 450 x10E3/uL      Assessment & Plan:   Problem List Items Addressed This Visit       Cardiovascular and Mediastinum   Hypotension   Suspect low BP related to poor oral intake, food and fluids. Plus she has been taking all of her BP medications. Would recommend holding these. However, at this time due to her syncopal episodes and weakness recommend she return to ER. EMS called to assist with transfer.  Both patient and daughter in agreeance with this plan due to her worsening weakness. Suspect will need IV therapy and BP medications held until vitals improve.  Respiratory   Gram-negative pneumonia (HCC) - Primary   Acute. Overall lung sounds reassuring today, however patient remains very weak and had two syncopal episodes in office. Have recommended return to ER.  EMS to office to assist.      Relevant Orders   Comprehensive metabolic panel with GFR   CBC (STAT) (Completed)   Acute hypoxic respiratory failure (HCC)   Overall in office today she remains weak and two syncopal episodes.  O2 sats did decrease slightly with movement from chair to exam table.  BP hypotensive, suspect related to hypovolemia with poor hydration and meal intake.  Sending to ER via EMS for further evaluation.  Suspect will need admission for IV hydration and hold BP medications.  May benefit from rehab bed at [facility] upon discharge prior to returning home to ensure increased strength.      Relevant Orders   CBC (STAT) (Completed)     Endocrine   DM2 (diabetes mellitus, type 2) (HCC)   Chronic, ongoing with A1c 6.9% one recent hospital check, urine ALB 29 March 2022, repeat next visit.  Taking Metformin  1000 MG BID and tolerating.  Recommend check BS fasting every morning and document for visits.  Recommend heavy focus on diabetic diet at home and regular exercise. - Eye exam up to date. Will get records. - Foot exam up to date - ARB and Statin on board -  Pneumococcal and Flu vaccines up to date.      Relevant Orders   Comprehensive metabolic panel with GFR   Adrenal insufficiency (Addison's disease) (HCC)   Chronic, ongoing.  Followed by endocrinology, continue current regimen as ordered by them.  Recent notes reviewed.        Musculoskeletal and Integument   DRESS syndrome   Due to recent allergic reaction to Clindamycin for dental abscess.  Overall rash improved, but having pruritus due to remaining dry skin.  Recommend OTC Sarna lotion, daughter plans on purchasing this. Would avoid Benadryl  due to age, could trial Claritin or Zyrtec daily.        Other   Syncope   One with brief LOC/no injury and second near syncopal episode.  EKG reassuring in office. BP hypotensive, suspect related to hypovolemia with poor hydration and meal intake.  Sending to ER via EMS for further evaluation.  Suspect will need admission for IV hydration and hold BP medications.  May benefit from rehab bed at [facility] upon discharge prior to returning home to ensure increased strength.      Obesity   BMI 38.13.  Recommended eating smaller high protein, low fat meals more frequently and exercising 30 mins a day 5 times a week with a goal of 10-15lb weight loss in the next 3 months. Patient voiced their understanding and motivation to adhere to these recommendations.       Leukocytosis   CBC in office with elevation in WBC, recent PNA.  Returning to ER for further assessment.      Other Visit Diagnoses       Low hemoglobin       H/H checked today improving from discharge: 12.3/36.7, however continues to be weak and poor appetite/hydration.   Relevant Orders   Ferritin   Iron   CBC (STAT) (Completed)        Follow up plan: Return in about 1 week (around 09/21/2023) for PNEUMONIA.

## 2023-09-14 NOTE — ED Notes (Signed)
 CCMD called to admit patient to cardiac monitor.

## 2023-09-15 ENCOUNTER — Ambulatory Visit: Payer: Self-pay | Admitting: Nurse Practitioner

## 2023-09-15 DIAGNOSIS — D7212 Drug rash with eosinophilia and systemic symptoms syndrome: Secondary | ICD-10-CM

## 2023-09-15 DIAGNOSIS — E1169 Type 2 diabetes mellitus with other specified complication: Secondary | ICD-10-CM | POA: Diagnosis not present

## 2023-09-15 DIAGNOSIS — I152 Hypertension secondary to endocrine disorders: Secondary | ICD-10-CM

## 2023-09-15 DIAGNOSIS — E1159 Type 2 diabetes mellitus with other circulatory complications: Secondary | ICD-10-CM

## 2023-09-15 DIAGNOSIS — T50905A Adverse effect of unspecified drugs, medicaments and biological substances, initial encounter: Secondary | ICD-10-CM

## 2023-09-15 DIAGNOSIS — R55 Syncope and collapse: Secondary | ICD-10-CM | POA: Diagnosis not present

## 2023-09-15 LAB — COMPREHENSIVE METABOLIC PANEL WITH GFR
ALT: 19 IU/L (ref 0–32)
ALT: 17 U/L (ref 0–44)
AST: 12 IU/L (ref 0–40)
AST: 11 U/L — ABNORMAL LOW (ref 15–41)
Albumin: 4 g/dL (ref 3.8–4.8)
Albumin: 3.4 g/dL — ABNORMAL LOW (ref 3.5–5.0)
Alkaline Phosphatase: 75 IU/L (ref 44–121)
Alkaline Phosphatase: 54 U/L (ref 38–126)
Anion gap: 12 (ref 5–15)
BUN/Creatinine Ratio: 11 — ABNORMAL LOW (ref 12–28)
BUN: 17 mg/dL (ref 8–27)
BUN: 18 mg/dL (ref 8–23)
Bilirubin Total: 0.6 mg/dL (ref 0.0–1.2)
CO2: 22 mmol/L (ref 20–29)
CO2: 28 mmol/L (ref 22–32)
Calcium: 9 mg/dL (ref 8.7–10.3)
Calcium: 8.4 mg/dL — ABNORMAL LOW (ref 8.9–10.3)
Chloride: 97 mmol/L (ref 96–106)
Chloride: 99 mmol/L (ref 98–111)
Creatinine, Ser: 1.61 mg/dL — ABNORMAL HIGH (ref 0.57–1.00)
Creatinine, Ser: 1.2 mg/dL — ABNORMAL HIGH (ref 0.44–1.00)
GFR, Estimated: 48 mL/min — ABNORMAL LOW (ref >60)
Globulin, Total: 2 g/dL (ref 1.5–4.5)
Glucose, Bld: 170 mg/dL — ABNORMAL HIGH (ref 70–99)
Glucose: 175 mg/dL — ABNORMAL HIGH (ref 70–99)
Potassium: 3.6 mmol/L (ref 3.5–5.2)
Potassium: 3.6 mmol/L (ref 3.5–5.1)
Sodium: 142 mmol/L (ref 134–144)
Sodium: 139 mmol/L (ref 135–145)
Total Bilirubin: 1.2 mg/dL (ref 0.0–1.2)
Total Protein: 6 g/dL (ref 6.0–8.5)
Total Protein: 6.4 g/dL — ABNORMAL LOW (ref 6.5–8.1)
eGFR: 34 mL/min/1.73 — ABNORMAL LOW (ref >59)

## 2023-09-15 LAB — URINALYSIS, W/ REFLEX TO CULTURE (INFECTION SUSPECTED)
Bilirubin Urine: NEGATIVE
Glucose, UA: NEGATIVE mg/dL
Hgb urine dipstick: NEGATIVE
Ketones, ur: NEGATIVE mg/dL
Leukocytes,Ua: NEGATIVE
Nitrite: NEGATIVE
Protein, ur: NEGATIVE mg/dL
Specific Gravity, Urine: 1.01 (ref 1.005–1.030)
pH: 5 (ref 5.0–8.0)

## 2023-09-15 LAB — CBC
HCT: 34.9 % — ABNORMAL LOW (ref 36.0–46.0)
Hemoglobin: 11.4 g/dL — ABNORMAL LOW (ref 12.0–15.0)
MCH: 28.9 pg (ref 26.0–34.0)
MCHC: 32.7 g/dL (ref 30.0–36.0)
MCV: 88.4 fL (ref 80.0–100.0)
Platelets: 225 K/uL (ref 150–400)
RBC: 3.95 MIL/uL (ref 3.87–5.11)
RDW: 13.4 % (ref 11.5–15.5)
WBC: 6.7 K/uL (ref 4.0–10.5)
nRBC: 0 % (ref 0.0–0.2)

## 2023-09-15 LAB — IRON: Iron: 72 ug/dL (ref 27–139)

## 2023-09-15 LAB — FERRITIN: Ferritin: 163 ng/mL — ABNORMAL HIGH (ref 15–150)

## 2023-09-15 MED ORDER — HYDROCORTISONE SOD SUC (PF) 100 MG IJ SOLR
100.0000 mg | Freq: Two times a day (BID) | INTRAMUSCULAR | Status: DC
  Administered 2023-09-16: 100 mg via INTRAVENOUS
  Filled 2023-09-15 (×2): qty 2

## 2023-09-15 MED ORDER — LACTATED RINGERS IV SOLN: INTRAVENOUS | Status: AC

## 2023-09-15 NOTE — Evaluation (Signed)
 Physical Therapy Evaluation Patient Details Name: Sarah [name] MRN: [id number] DOB: 08-Apr-1950 Today's Date: 09/15/2023  History of Present Illness  Pt is a 73 y/o F presenting to ED after 2 episodes of syncope at PCP office. Pt has had 2 prior admissions in the last month, from 8/11-8/15 and 8/16-8/19. PMH significant for T2DM, depression, adrenal insufficiency, malignant malinoma, chronic HFpEF, HLD, CKD-III, DRESS Syndrome.   Clinical Impression  Pt A&Ox4 and agreeable to PT evaluation. Pt denied pain, but cited dizziness throughout session ranging from 4-6/10 intensity. At baseline, pt reports she has been using RW for amb, but is otherwise IND with mobility and IADLs. Pt completed bed mobility with supervision, increased time and effort, with bed rail assist to pull to sitting. STS from EOB and toilet with min-modA. Pt able to amb ~24 ft in room with RW and CGA. Orthostatic vital signs assessed during session with no evidence of orthostatic hypotension, but pt remained dizzy with transfers. Pt has had a number of frequent hospitalizations in the last month and is currently displaying deficits in functional mobility and activity tolerance and would benefit from skilled PT intervention to address listed deficits and allow for safe return to PLOF.       If plan is discharge home, recommend the following: A little help with walking and/or transfers;Assistance with cooking/housework;Assist for transportation   Can travel by private vehicle   Yes    Equipment Recommendations Other (comment) (TBD)  Recommendations for Other Services       Functional Status Assessment Patient has had a recent decline in their functional status and demonstrates the ability to make significant improvements in function in a reasonable and predictable amount of time.     Precautions / Restrictions Precautions Precautions: Fall Recall of Precautions/Restrictions: Intact Restrictions Weight Bearing Restrictions  Per Provider Order: No      Mobility  Bed Mobility Overal bed mobility: Needs Assistance Bed Mobility: Supine to Sit     Supine to sit: Supervision, HOB elevated, Used rails     General bed mobility comments: no physical assistance, use of bed rail to pull to sitting    Transfers Overall transfer level: Needs assistance Equipment used: Rolling walker (2 wheels) Transfers: Sit to/from Stand Sit to Stand: Min assist, Mod assist           General transfer comment: minA STS from EOB, modA STS from toilet    Ambulation/Gait Ambulation/Gait assistance: Contact guard assist Gait Distance (Feet): 24 Feet Assistive device: Rolling walker (2 wheels) Gait Pattern/deviations: Step-through pattern, Trunk flexed, Wide base of support Gait velocity: decreased     General Gait Details: decreased cadence, reliant on RW  Stairs            Wheelchair Mobility     Tilt Bed    Modified Rankin (Stroke Patients Only)       Balance Overall balance assessment: Needs assistance Sitting-balance support: Feet supported Sitting balance-Leahy Scale: Good     Standing balance support: Bilateral upper extremity supported, No upper extremity supported, During functional activity Standing balance-Leahy Scale: Good Standing balance comment: able to static stand for pericare and to wash hands at sink with no LOB                             Pertinent Vitals/Pain Pain Assessment Pain Assessment: No/denies pain    Home Living Family/patient expects to be discharged to:: Private residence Living Arrangements: Alone Available Help at  Discharge: Family;Available PRN/intermittently Type of Home: Independent living facility Home Access: Level entry       Home Layout: One level Home Equipment: Shower seat - built in;Grab bars - toilet;Grab bars - tub/shower;Hand held Programmer, systems (2 wheels)      Prior Function Prior Level of Function :  Independent/Modified Independent             Mobility Comments: Pt reports that she has been using RW for amb following most recent hospitalization, fell yesterday at PCP office 2/2 syncope, no other falls. ADLs Comments: Pt reports being IND with IADLs     Extremity/Trunk Assessment   Upper Extremity Assessment Upper Extremity Assessment: Defer to OT evaluation    Lower Extremity Assessment Lower Extremity Assessment: Generalized weakness       Communication   Communication Communication: No apparent difficulties    Cognition Arousal: Alert Behavior During Therapy: WFL for tasks assessed/performed   PT - Cognitive impairments: No apparent impairments                         Following commands: Intact       Cueing Cueing Techniques: Verbal cues, Visual cues     General Comments      Exercises Other Exercises Other Exercises: orthostatic vitals assessed: 136/63 in supine, 139/72 sitting EOB, 149/69 standing. Pt reported dizziness of 4/10 in supine, 5-6/10 sitting EOB, 4/10 in standing.   Assessment/Plan    PT Assessment Patient needs continued PT services  PT Problem List Decreased strength;Decreased activity tolerance;Decreased balance;Decreased mobility       PT Treatment Interventions DME instruction;Gait training;Functional mobility training;Therapeutic activities;Therapeutic exercise;Balance training;Neuromuscular re-education;Patient/family education    PT Goals (Current goals can be found in the Care Plan section)  Acute Rehab PT Goals Patient Stated Goal: to get better PT Goal Formulation: With patient Time For Goal Achievement: 09/29/23 Potential to Achieve Goals: Good    Frequency Min 2X/week     Co-evaluation PT/OT/SLP Co-Evaluation/Treatment: Yes Reason for Co-Treatment: To address functional/ADL transfers PT goals addressed during session: Mobility/safety with mobility         AM-PAC PT 6 Clicks Mobility  Outcome  Measure Help needed turning from your back to your side while in a flat bed without using bedrails?: A Little Help needed moving from lying on your back to sitting on the side of a flat bed without using bedrails?: A Little Help needed moving to and from a bed to a chair (including a wheelchair)?: A Little Help needed standing up from a chair using your arms (e.g., wheelchair or bedside chair)?: A Little Help needed to walk in hospital room?: A Little Help needed climbing 3-5 steps with a railing? : A Little 6 Click Score: 18    End of Session Equipment Utilized During Treatment: Gait belt Activity Tolerance: Patient tolerated treatment well Patient left: in chair;with call bell/phone within reach;with chair alarm set Nurse Communication: Mobility status;Other (comment) (orthostatic vital signs) PT Visit Diagnosis: Unsteadiness on feet (R26.81);Other abnormalities of gait and mobility (R26.89);Difficulty in walking, not elsewhere classified (R26.2)    Time: 9096-9065 PT Time Calculation (min) (ACUTE ONLY): 31 min   Charges:   PT Evaluation $PT Eval Low Complexity: 1 Low PT Treatments $Therapeutic Activity: 8-22 mins PT General Charges $$ ACUTE PT VISIT: 1 Visit         Janell Axe, SPT

## 2023-09-15 NOTE — Care Management Obs Status (Signed)
 MEDICARE OBSERVATION STATUS NOTIFICATION   Patient Details  Name: Sarah [name] MRN: [id number] Date of Birth: 1950/05/23   Medicare Observation Status Notification Given:  Yes (patient did not want a copy)    Sarah [name] 09/15/2023, 12:10 PM

## 2023-09-15 NOTE — TOC Progression Note (Signed)
 Transition of Care [facility]) - Progression Note    Patient Details  Name: Berenize Gatlin MRN: [id number] Date of Birth: [birth year]  [facility] CM/SW Contact  Corean [name], RN Phone Number: 09/15/2023, 4:32 PM  Clinical Narrative:      Therapy recommending SNF Patient in agreement.  Alfonso at [employer].   [facility] system is currently down and I am unable to submit for PASRR Fl2 sent for signature                    Expected Discharge Plan and Services                                               Social Drivers of Health (SDOH) Interventions SDOH Screenings   Food Insecurity: No Food Insecurity (09/14/2023)  Housing: Unknown (09/14/2023)  Transportation Needs: No Transportation Needs (09/14/2023)  Utilities: Not At Risk (09/14/2023)  Alcohol Screen: Low Risk  (09/29/2022)  Depression (PHQ2-9): Medium Risk (09/14/2023)  Financial Resource Strain: Low Risk  (04/22/2023)   Received from [facility]  Physical Activity: Insufficiently Active (09/29/2022)  Social Connections: Moderately Integrated (09/14/2023)  Recent Concern: Social Connections - Moderately Isolated (09/05/2023)  Stress: No Stress Concern Present (09/29/2022)  Tobacco Use: Low Risk  (09/14/2023)  Health Literacy: Adequate Health Literacy (09/29/2022)    Readmission Risk Interventions     No data to display

## 2023-09-15 NOTE — Progress Notes (Signed)
 PROGRESS NOTE    Arayna Illescas  FMW:[id number] DOB: February 23, 1950 DOA: 09/14/2023 PCP: Valerio Melanie DASEN, NP   Brief Narrative:   73 y.o. female with medical history significant of T2DM with neuropathy, depression, adrenal insufficiency, malignant melanoma, chronic HFpEF, hyperlipidemia, stage III CKD with recent admission for anaphylaxis, dress syndrome, acute hypoxic respiratory failure with concern for pneumonia presenting with syncope   8/27: PT, OT eval - SNF   Assessment & Plan:   Principal Problem:   Syncope Active Problems:   Leukocytosis   Acute kidney injury superimposed on chronic kidney disease (HCC)   DRESS syndrome   Acute hypoxic respiratory failure (HCC)   Hypertension associated with diabetes (HCC)   DM2 (diabetes mellitus, type 2) (HCC)   Adrenal insufficiency (Addison's disease) (HCC)   (HFpEF) heart failure with preserved ejection fraction (HCC)  * Syncope Noted syncopal episodes x 2 with outpatient PCP follow-up Symptoms predominantly going from sitting to standing Markedly dry in exam on admission but improving with hydration. 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   Acute kidney injury superimposed on chronic kidney disease (HCC) Creatinine improving with hydration. Likely prerenal etio due to dehydration Lab Results  Component Value  Date   CREATININE 1.20 (H) 09/15/2023   CREATININE 1.77 (H) 09/14/2023   CREATININE 1.61 (H) 09/14/2023        DRESS syndrome Recent admission August 16 through 19 for issues including anaphylaxis and dress syndrome Appears clinically resolved at present No reported itching at present. Skin sloughing improved Status post steroid taper   Acute hypoxic respiratory failure (HCC) Noted recent admission for acute respiratory failure with hypoxia in the setting of pneumonia-resolved No active hypoxia present Chest x-ray within normal limits   (HFpEF) heart failure with preserved ejection fraction (HCC) 2D echo August 2025 with EF of 55 to 60% and grade 2 diastolic dysfunction Appears clinically dry in setting of syncopal event x 2 Weight today 102.1 kg Continue IVFs another 24 hrs   Adrenal insufficiency (Addison's disease) (HCC) Appears to have acute on chronic adrenal sufficiency in the setting of syncopal event x 2 today Clinically dry but improving with hydration. Taper stress dose Solu-Cortef  from 100 tid -> BID   DM2 (diabetes mellitus, type 2) (HCC) monitor   Hypertension associated with diabetes (HCC) Noted orthostatic symptoms on presentation  Hold BP regimen for now  Encourage PO nutrition and continue IVFs another 24 hrs  Unintentional wt loss She reports poor appetite and wt loss of > 20 lbs in a month. Unsure etio, had normal TSH on 09/04/23 Consider outpt Onco eval or further work up as need  DVT prophylaxis: (Lovenox /Heparin /SCD's/anticoagulated/None (if comfort care)      Code Status: (Full code Family Communication: none at  bedside Disposition Plan: SNF at [facility]      Subjective:  Reports poor appetite and unintentional wt loss. Sitting in the chair, just not feeling well per  her, denies feeling depressed  Objective: Vitals:   09/14/23 1936 09/15/23 0534 09/15/23 0754 09/15/23 1654  BP: (!) 105/51 124/69 (!) 146/70 (!) 150/83  Pulse: 68 71  65 96  Resp: 16 16 16 16   Temp: 98.3 F (36.8 C) 98.4 F (36.9 C) 98.4 F (36.9 C) 98.2 F (36.8 C)  TempSrc: Oral Oral    SpO2: 95% 95% 91% 99%  Weight:      Height:        Intake/Output Summary (Last 24 hours) at 09/15/2023 1904 Last data filed at 09/15/2023 1546 Gross per 24 hour  Intake 2097.18 ml  Output --  Net 2097.18 ml   Filed Weights   09/14/23 1206  Weight: 102.1 kg    Examination:  General exam: Appears calm and comfortable  Respiratory system: Clear to auscultation. Respiratory effort normal. Cardiovascular system: S1 & S2 heard, RRR. No JVD, murmurs, rubs, gallops or clicks. No pedal edema. Gastrointestinal system: Abdomen is soft, benign Central nervous system: Alert and oriented. No focal neurological deficits. Extremities: Symmetric 5 x 5 power. Skin: No rashes, lesions or ulcers Psychiatry: Judgement and insight appear normal. Mood & affect appropriate.     Data Reviewed: I have personally reviewed following labs and imaging studies  CBC: Recent Labs  Lab 09/14/23 1045 09/14/23 1209 09/15/23 0600  WBC 11.8* 15.7* 6.7  HGB 12.3 12.0 11.4*  HCT 36.7 36.8 34.9*  MCV 88 89.8 88.4  PLT 235 245 225   Basic Metabolic Panel: Recent Labs  Lab 09/14/23 1044 09/14/23 1209 09/15/23 0600  NA 142 143 139  K 3.6 3.7 3.6  CL 97 98 99  CO2 22 30 28   GLUCOSE 175* 155* 170*  BUN 17 20 18   CREATININE 1.61* 1.77* 1.20*  CALCIUM  9.0 9.1 8.4*  MG  --  1.6*  --    GFR: Estimated Creatinine Clearance: 49.3 mL/min (A) (by C-G formula based on SCr of 1.2 mg/dL (H)). Liver Function Tests: Recent Labs  Lab 09/14/23 1044 09/14/23 1209 09/15/23 0600  AST 12 18 11*  ALT 19 15 17   ALKPHOS 75 48 54  BILITOT 0.6 1.1 1.2  PROT 6.0 5.8* 6.4*  ALBUMIN 4.0 2.9* 3.4*   No results for input(s): LIPASE, AMYLASE in the last 168 hours. No results for input(s): AMMONIA in the last 168 hours. Coagulation Profile: Recent Labs  Lab 09/14/23 1313  INR 1.0     CBG: Recent Labs  Lab 09/14/23 1950  GLUCAP 213*   Lipid Profile: No results for input(s): CHOL, HDL, LDLCALC, TRIG, CHOLHDL, LDLDIRECT in the last 72 hours. Thyroid  Function Tests: No results for input(s): TSH, T4TOTAL, FREET4, T3FREE, THYROIDAB in the last 72 hours. Anemia Panel: Recent Labs    09/14/23 1044  FERRITIN 163*  IRON 72   Sepsis Labs: Recent Labs  Lab 09/14/23 1313 09/14/23 1516  LATICACIDVEN 2.4* 1.5    Recent Results (from the past 240 hours)  Blood culture (routine single)     Status: None (Preliminary result)   Collection Time: 09/14/23  1:13 PM   Specimen: BLOOD  Result Value Ref Range Status   Specimen Description BLOOD BLOOD RIGHT FOREARM  Final   Special Requests   Final    BOTTLES DRAWN AEROBIC AND ANAEROBIC Blood Culture results may not be optimal due to an inadequate volume of blood received in culture bottles   Culture   Final    NO GROWTH < 24 HOURS Performed at [facility], [address]  [place], [place] [address]    Report Status PENDING  Incomplete  Blood culture (single)     Status: None (Preliminary result)   Collection Time: 09/14/23  7:05 PM   Specimen: BLOOD  Result Value Ref Range Status   Specimen Description BLOOD BLOOD LEFT HAND [facility]  Final   Special Requests   Final    BOTTLES DRAWN AEROBIC AND ANAEROBIC Blood Culture adequate volume   Culture   Final    NO GROWTH < 12 HOURS Performed at [facility], [address], [place], [place] [address]    Report Status PENDING  Incomplete  Culture, blood (single)     Status: None (Preliminary result)   Collection Time: 09/14/23  7:20 PM   Specimen: BLOOD  Result Value Ref Range Status   Specimen Description BLOOD BLOOD RIGHT HAND [facility]  Final   Special Requests   Final    BOTTLES DRAWN AEROBIC ONLY Blood Culture adequate volume   Culture   Final    NO GROWTH < 12 HOURS Performed at [facility], [address],  [place], [place] [address]    Report Status PENDING  Incomplete         Radiology Studies: MR BRAIN WO CONTRAST Result Date: 09/14/2023 CLINICAL DATA:  Syncope/presyncope, cerebrovascular cause suspected EXAM: MRI HEAD WITHOUT CONTRAST TECHNIQUE: Multiplanar, multiecho pulse sequences of the brain and surrounding structures were obtained without intravenous contrast. COMPARISON:  CT head 08/30/2023. FINDINGS: Brain: No acute infarction, hemorrhage, hydrocephalus, extra-axial collection or mass lesion. Remote left cerebellar infarct. Vascular: Normal flow voids. Skull and upper cervical spine: Normal marrow signal. Sinuses/Orbits: Negative. Other: No mastoid effusions. IMPRESSION: 1. No evidence of acute intracranial abnormality. 2. Remote left cerebellar infarct. Electronically Signed   By: Gilmore GORMAN Molt M.D.   On: 09/14/2023 17:14   DG Chest Port 1 View Result Date: 09/14/2023 EXAM: 1 VIEW XRAY OF THE CHEST 09/14/2023 12:33:12 PM COMPARISON: 09/04/2023 CLINICAL HISTORY: [id number] SOB (shortness of breath) [id number]. two syncopal episodes while at her doctor's office today. Pt with no reports of hitting her head. Pt has been feeling weak the past week. Pt was was discharged from the hospital a week ago with pnx. Pt with complaints of chronic pain in her back 7/10. ; hypotensive FINDINGS: LUNGS AND PLEURA: Atelectasis versus scar noted in the right lung base. No pulmonary edema. No pleural effusion. No pneumothorax. HEART AND MEDIASTINUM: No acute abnormality of the cardiac and mediastinal silhouettes. BONES AND SOFT TISSUES: No acute osseous abnormality. IMPRESSION: 1. Atelectasis versus scar in the right lung base. Electronically signed by: Taylor Stroud MD 09/14/2023 01:08 PM EDT RP Workstation: [id number]        Scheduled Meds:  enoxaparin  (LOVENOX ) injection  0.5 mg/kg Subcutaneous Q24H   hydrocortisone  sod succinate (SOLU-CORTEF ) inj  100 mg Intravenous Q8H   multivitamin with minerals  1 tablet  Oral Daily   sodium chloride  flush  3 mL Intravenous Q12H   Continuous Infusions:  lactated ringers  75 mL/hr at 09/15/23 1546     LOS: 0 days    Time spent: 35 mins    Auna Mikkelsen Maree, MD [facility] Pager [phone]  If 7PM-7AM, please contact night-coverage [email or web address]  09/15/2023, 7:04 PM

## 2023-09-15 NOTE — Evaluation (Signed)
 Occupational Therapy Evaluation Patient Details Name: Sarah [name] MRN: [id number] DOB: Apr 09, 1950 Today's Date: 09/15/2023   History of Present Illness   Pt is a 73 y/o F presenting to ED after 2 episodes of syncope at PCP office. Pt has had 2 prior admissions in the last month, from 8/11-8/15 and 8/16-8/19. PMH significant for T2DM, depression, adrenal insufficiency, malignant malinoma, chronic HFpEF, HLD, CKD-III, DRESS Syndrome.     Clinical Impressions Patient presenting with decreased Ind in self care,balance, functional mobility/transfers, endurance, and safety awareness. Patient reports living at an independent living facility alone. Pt has had multiple falls and hospital admissions recently. Pt needing min- mod A for functional transfers depending on height of surface. Pt ambulates with min guard and use of RW this session.Min A for bed mobility. Min A for standing balance when performing standing hygiene and clothing management. Pt returning to sit in recliner chair at end of session. Patient will benefit from acute OT to increase overall independence in the areas of ADLs, functional mobility, and safety awareness in order to safely discharge.     If plan is discharge home, recommend the following:   Assistance with cooking/housework;A little help with walking and/or transfers;A little help with bathing/dressing/bathroom;Help with stairs or ramp for entrance;Assist for transportation     Functional Status Assessment   Patient has had a recent decline in their functional status and demonstrates the ability to make significant improvements in function in a reasonable and predictable amount of time.     Equipment Recommendations   Other (comment) (defer to next venue of care)      Precautions/Restrictions   Precautions Precautions: Fall Recall of Precautions/Restrictions: Intact     Mobility Bed Mobility Overal bed mobility: Needs Assistance Bed Mobility: Supine to  Sit     Supine to sit: Supervision, HOB elevated, Used rails          Transfers Overall transfer level: Needs assistance Equipment used: Rolling walker (2 wheels) Transfers: Sit to/from Stand Sit to Stand: Min assist, Mod assist                  Balance Overall balance assessment: Needs assistance   Sitting balance-Leahy Scale: Good     Standing balance support: Bilateral upper extremity supported, No upper extremity supported, During functional activity                               ADL either performed or assessed with clinical judgement   ADL Overall ADL's : Needs assistance/impaired     Grooming: Wash/dry hands;Standing;Minimal assistance                   Toilet Transfer: Moderate assistance;Ambulation;Rolling walker (2 wheels);Regular Toilet   Toileting- Clothing Manipulation and Hygiene: Minimal assistance;Sit to/from stand               Vision Patient Visual Report: No change from baseline              Pertinent Vitals/Pain Pain Assessment Pain Assessment: No/denies pain     Extremity/Trunk Assessment Upper Extremity Assessment Upper Extremity Assessment: Generalized weakness   Lower Extremity Assessment Lower Extremity Assessment: Generalized weakness   Cervical / Trunk Assessment Cervical / Trunk Assessment: Normal   Communication Communication Communication: No apparent difficulties   Cognition Arousal: Alert Behavior During Therapy: WFL for tasks assessed/performed Cognition: No apparent impairments  OT - Cognition Comments: A/Ox4                 Following commands: Intact       Cueing  General Comments   Cueing Techniques: Verbal cues;Visual cues              Home Living Family/patient expects to be discharged to:: Private residence Living Arrangements: Alone Available Help at Discharge: Family;Available PRN/intermittently Type of Home: Independent living  facility Home Access: Level entry     Home Layout: One level     Bathroom Shower/Tub: Producer, television/film/video: Standard                    OT Problem List: Decreased strength;Decreased range of motion;Decreased activity tolerance;Impaired balance (sitting and/or standing);Decreased safety awareness   OT Treatment/Interventions: Self-care/ADL training;Therapeutic exercise;Patient/family education;Balance training;Energy conservation;Therapeutic activities      OT Goals(Current goals can be found in the care plan section)   Acute Rehab OT Goals Patient Stated Goal: to go home OT Goal Formulation: With patient Time For Goal Achievement: 09/29/23 Potential to Achieve Goals: Fair ADL Goals Pt Will Perform Grooming: with modified independence;standing Pt Will Perform Lower Body Dressing: with modified independence;sit to/from stand Pt Will Transfer to Toilet: with modified independence;ambulating Pt Will Perform Toileting - Clothing Manipulation and hygiene: with modified independence;sit to/from stand   OT Frequency:  Min 2X/week    Co-evaluation PT/OT/SLP Co-Evaluation/Treatment: Yes Reason for Co-Treatment: To address functional/ADL transfers PT goals addressed during session: Mobility/safety with mobility OT goals addressed during session: ADL's and self-care      AM-PAC OT 6 Clicks Daily Activity     Outcome Measure Help from another person eating meals?: None Help from another person taking care of personal grooming?: None Help from another person toileting, which includes using toliet, bedpan, or urinal?: None Help from another person bathing (including washing, rinsing, drying)?: None Help from another person to put on and taking off regular upper body clothing?: None Help from another person to put on and taking off regular lower body clothing?: A Little 6 Click Score: 23   End of Session Equipment Utilized During Treatment: Rolling walker (2  wheels) Nurse Communication: Mobility status  Activity Tolerance: Patient tolerated treatment well Patient left: in chair;with call bell/phone within reach;with chair alarm set  OT Visit Diagnosis: Unsteadiness on feet (R26.81)                Time: 9081-9065 OT Time Calculation (min): 16 min Charges:  OT General Charges $OT Visit: 1 Visit OT Evaluation $OT Eval Low Complexity: 1 Low  Izetta Claude, MS, OTR/L , CBIS ascom [phone]  09/15/23, 3:50 PM

## 2023-09-15 NOTE — NC FL2 (Cosign Needed)
 [place]  MEDICAID [id number] LEVEL OF CARE FORM     IDENTIFICATION  Patient Name: Sarah [name] Birthdate: Jun 12, 1950 Sex: female Admission Date (Current Location): 09/14/2023  [facility] and [place] Number:  Chiropodist and Address:        [facility] [address] [place] [address] Provider Number: [id number]  Attending Physician Name and Address:  Maree Hue, MD  Relative Name and Phone Number:       Current Level of Care: Hospital Recommended Level of Care: Skilled Nursing Facility Prior Approval Number:    Date Approved/Denied:   PASRR Number: pending  Discharge Plan: SNF    Current Diagnoses: Patient Active Problem List   Diagnosis Date Noted   Syncope 09/14/2023   Leukocytosis 09/14/2023   (HFpEF) heart failure with preserved ejection fraction (HCC) 09/14/2023   Acute kidney injury superimposed on chronic kidney disease (HCC) 09/14/2023   Hypotension 09/14/2023   Acute hypoxic respiratory failure (HCC) 09/05/2023   Gram-negative pneumonia (HCC) 09/05/2023   Nonischemic nontraumatic myocardial injury 09/05/2023   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   Diabetes mellitus treated with oral medication (HCC) 03/28/2023   Obesity 09/20/2022   Osteopenia of neck of right femur 10/02/2021   Cervical radiculitis 07/11/2021   Vitamin D  deficiency 02/05/2021   Adrenal insufficiency (Addison's disease) (HCC) 02/04/2021   Chronic lower back pain 02/02/2021   Insomnia due to other mental disorder 02/02/2021   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   B12 deficiency 02/01/2018   History of antineoplastic chemotherapy 10/17/2017   Prolonged Q-T interval on ECG 09/04/2017   History of malignant melanoma 01/07/2017   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HCC) 12/26/2011   Hypertension associated with diabetes (HCC) 09/17/2010   Hyperlipidemia associated with type 2 diabetes mellitus (HCC) 09/17/2010   DM2 (diabetes mellitus, type 2)  (HCC) 09/17/2010    Orientation RESPIRATION BLADDER Height & Weight     Self, Time, Situation, Place  Normal Continent Weight: 102.1 kg Height:  5' 4 (162.6 cm)  BEHAVIORAL SYMPTOMS/MOOD NEUROLOGICAL BOWEL NUTRITION STATUS      Continent Diet (Heart Healthy)  AMBULATORY STATUS COMMUNICATION OF NEEDS Skin   Limited Assist Verbally Normal                       Personal Care Assistance Level of Assistance              Functional Limitations Info             SPECIAL CARE FACTORS FREQUENCY  PT (By licensed PT), OT (By licensed OT)                    Contractures Contractures Info: Not present    Additional Factors Info  Code Status, Allergies Code Status Info: Full Allergies Info: : Clindamycin/lincomycin, Doxycycline , Aspirin , Ciprofloxacin, Penicillins           Current Medications (09/15/2023):  This is the current hospital active medication list Current Facility-Administered Medications  Medication Dose Route Frequency Provider Last Rate Last Admin   acetaminophen  (TYLENOL ) tablet 650 mg  650 mg Oral Q6H PRN Newton, Steven J, MD       enoxaparin  (LOVENOX ) injection 50 mg  0.5 mg/kg Subcutaneous Q24H Eldonna Elspeth PARAS, MD   50 mg at 09/14/23 2212   hydrocortisone  sodium succinate  (SOLU-CORTEF ) 100 MG injection 100 mg  100 mg Intravenous Q8H Eldonna Elspeth PARAS, MD   100 mg at 09/15/23 1312   lactated ringers  infusion  Intravenous Continuous Maree Hue, MD 75 mL/hr at 09/15/23 1546 New Bag at 09/15/23 1546   multivitamin with minerals tablet 1 tablet  1 tablet Oral Daily Eldonna Elspeth PARAS, MD   1 tablet at 09/15/23 9090   ondansetron  (ZOFRAN ) tablet 4 mg  4 mg Oral Q6H PRN Eldonna Elspeth PARAS, MD       Or   ondansetron  (ZOFRAN ) injection 4 mg  4 mg Intravenous Q6H PRN Newton, Steven J, MD       sodium chloride  flush (NS) 0.9 % injection 3 mL  3 mL Intravenous Q12H Eldonna Elspeth PARAS, MD   3 mL at 09/15/23 0909     Discharge Medications: Please see discharge  summary for a list of discharge medications.  Relevant Imaging Results:  Relevant Lab Results:   Additional Information Ss# [id number]  Corean [name], RN

## 2023-09-15 NOTE — TOC Initial Note (Signed)
 Transition of Care [facility]) - Initial/Assessment Note    Patient Details  Name: Sarah [name] MRN: [id number] Date of Birth: 03-23-1950  [facility]) CM/SW Contact:    Corean [name], RN Phone Number: 09/15/2023, 10:26 AM  Clinical Narrative:                  Patient admitted from [facility] independent Living At discharge on 8/19 patient was set up with therapy services with on site provider at [facility], and ordered a [facility] PT/OT eval pending        Patient Goals and CMS Choice            Expected Discharge Plan and Services                                              Prior Living Arrangements/Services                       Activities of Daily Living      Permission Sought/Granted                  Emotional Assessment              Admission diagnosis:  Dehydration [E86.0] Syncope [R55] Weakness [R53.1] AKI (acute kidney injury) (HCC) [N17.9] Syncope, unspecified syncope type [R55] Patient Active Problem List   Diagnosis Date Noted   Syncope 09/14/2023   Leukocytosis 09/14/2023   (HFpEF) heart failure with preserved ejection fraction (HCC) 09/14/2023   Acute kidney injury superimposed on chronic kidney disease (HCC) 09/14/2023   Hypotension 09/14/2023   Acute hypoxic respiratory failure (HCC) 09/05/2023   Gram-negative pneumonia (HCC) 09/05/2023   Nonischemic nontraumatic myocardial injury 09/05/2023   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   Diabetes mellitus treated with oral medication (HCC) 03/28/2023   Obesity 09/20/2022   Osteopenia of neck of right femur 10/02/2021   Cervical radiculitis 07/11/2021   Vitamin D  deficiency 02/05/2021   Adrenal insufficiency (Addison's disease) (HCC) 02/04/2021   Chronic lower back pain 02/02/2021   Insomnia due to other mental disorder 02/02/2021   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   B12 deficiency 02/01/2018   History of antineoplastic chemotherapy 10/17/2017    Prolonged Q-T interval on ECG 09/04/2017   History of malignant melanoma 01/07/2017   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HCC) 12/26/2011   Hypertension associated with diabetes (HCC) 09/17/2010   Hyperlipidemia associated with type 2 diabetes mellitus (HCC) 09/17/2010   DM2 (diabetes mellitus, type 2) (HCC) 09/17/2010   PCP:  Valerio Melanie ONEIDA, NP Pharmacy:   [facility] [phone] [place], [place] - [address] [place] [address] Phone: [phone] Fax: [phone]  [facility] - [place], [place] - [address] [place] [address] Phone: [phone] Fax: [phone]  [facility] [address] [place] [address] Phone: [phone] Fax: [phone]     Social Drivers of Health (SDOH) Social History: SDOH Screenings   Food Insecurity: No Food Insecurity (09/14/2023)  Housing: Unknown (09/14/2023)  Transportation Needs: No Transportation Needs (09/14/2023)  Utilities: Not At Risk (09/14/2023)  Alcohol Screen: Low Risk  (09/29/2022)  Depression (PHQ2-9): Medium Risk (09/14/2023)  Financial Resource Strain: Low Risk  (04/22/2023)   [facility]  Physical Activity: Insufficiently Active (09/29/2022)  Social Connections: Moderately Integrated (09/14/2023)  Recent Concern: Social Connections - Moderately Isolated (09/05/2023)  Stress: No Stress Concern Present (09/29/2022)  Tobacco Use: Low Risk  (09/14/2023)  Health Literacy: Adequate Health Literacy (09/29/2022)   SDOH Interventions:     Readmission Risk Interventions     No data to display

## 2023-09-15 NOTE — Progress Notes (Signed)
 Contacted via MyChart  Good afternoon Sarah [name], your labs have returned.  I know you are currently in hospital and sending healing vibes your way.  Labs are showing reduction in kidney function, which was also seen on hospital labs I know.  Ferritin level mildly elevated, which could be related to you having acute issues recently.  Iron level normal.  Any questions? Keep being amazing!!  Thank you for allowing me to participate in your care.  I appreciate you. Kindest regards, Jaidy Cottam

## 2023-09-16 ENCOUNTER — Telehealth: Payer: Self-pay | Admitting: Gastroenterology

## 2023-09-16 DIAGNOSIS — E271 Primary adrenocortical insufficiency: Secondary | ICD-10-CM | POA: Diagnosis not present

## 2023-09-16 DIAGNOSIS — D7212 Drug rash with eosinophilia and systemic symptoms syndrome: Secondary | ICD-10-CM | POA: Diagnosis not present

## 2023-09-16 DIAGNOSIS — E1169 Type 2 diabetes mellitus with other specified complication: Secondary | ICD-10-CM | POA: Diagnosis not present

## 2023-09-16 DIAGNOSIS — R55 Syncope and collapse: Secondary | ICD-10-CM | POA: Diagnosis not present

## 2023-09-16 LAB — CBC
HCT: 31.7 % — ABNORMAL LOW (ref 36.0–46.0)
Hemoglobin: 10.6 g/dL — ABNORMAL LOW (ref 12.0–15.0)
MCH: 29.3 pg (ref 26.0–34.0)
MCHC: 33.4 g/dL (ref 30.0–36.0)
MCV: 87.6 fL (ref 80.0–100.0)
Platelets: 167 K/uL (ref 150–400)
RBC: 3.62 MIL/uL — ABNORMAL LOW (ref 3.87–5.11)
RDW: 13.5 % (ref 11.5–15.5)
WBC: 4.7 K/uL (ref 4.0–10.5)
nRBC: 0 % (ref 0.0–0.2)

## 2023-09-16 LAB — COMPREHENSIVE METABOLIC PANEL WITH GFR
ALT: 14 U/L (ref 0–44)
AST: 12 U/L — ABNORMAL LOW (ref 15–41)
Albumin: 3.1 g/dL — ABNORMAL LOW (ref 3.5–5.0)
Alkaline Phosphatase: 49 U/L (ref 38–126)
Anion gap: 13 (ref 5–15)
BUN: 17 mg/dL (ref 8–23)
CO2: 26 mmol/L (ref 22–32)
Calcium: 8.7 mg/dL — ABNORMAL LOW (ref 8.9–10.3)
Chloride: 102 mmol/L (ref 98–111)
Creatinine, Ser: 0.93 mg/dL (ref 0.44–1.00)
GFR, Estimated: >60 mL/min (ref >60)
Glucose, Bld: 199 mg/dL — ABNORMAL HIGH (ref 70–99)
Potassium: 3.6 mmol/L (ref 3.5–5.1)
Sodium: 141 mmol/L (ref 135–145)
Total Bilirubin: 0.6 mg/dL (ref 0.0–1.2)
Total Protein: 5.8 g/dL — ABNORMAL LOW (ref 6.5–8.1)

## 2023-09-16 LAB — GLUCOSE, CAPILLARY
Glucose-Capillary: 191 mg/dL — ABNORMAL HIGH (ref 70–99)
Glucose-Capillary: 277 mg/dL — ABNORMAL HIGH (ref 70–99)

## 2023-09-16 MED ORDER — HYDROCORTISONE SOD SUC (PF) 100 MG IJ SOLR: 100.0000 mg | Freq: Every day | INTRAMUSCULAR | Status: DC

## 2023-09-16 MED ORDER — HYDROCODONE-ACETAMINOPHEN 5-325 MG PO TABS: 1.0000 | ORAL_TABLET | Freq: Four times a day (QID) | ORAL | 0 refills | Status: DC | PRN

## 2023-09-16 NOTE — Progress Notes (Signed)
 RE: Sarah [name] Date of Birth: [birth year] Date: 09/16/23      To Whom It May Concern:   Please be advised that the above-named patient will require a short-term nursing home stay - anticipated 30 days or less for rehabilitation and strengthening.  The plan is for return home

## 2023-09-16 NOTE — Telephone Encounter (Signed)
 Inbound call from [facility] requesting for a follow up visit for patient within 2 weeks for ED FU for GI bleed. Patient is currently scheduled for 10/15, requesting a call back to patient if there is any sooner appointments for patient. Please advise, thank you

## 2023-09-16 NOTE — TOC Transition Note (Signed)
 Transition of Care [facility]) - Discharge Note   Patient Details  Name: Sarah [name] MRN: [id number] Date of Birth: May 23, 1950  [facility] of [facility]) CM/SW Contact:  Corean [name], RN Phone Number: 09/16/2023, 4:15 PM   Clinical Narrative:    [name] received for [facility] received and provided to facility  Patient will [place] to[place] [facility] Anticipated DC date: 09/16/23  Family notified: Patient to call daughter Transport by: per patient daughter will transport  Per MD patient ready for DC to . RN, patient,  and facility notified of DC. Discharge Summary sent to facility. RN given number for report. DC packet on chart.   TOC signing off.          Patient Goals and CMS Choice            Discharge Placement                       Discharge Plan and Services Additional resources added to the After Visit Summary for                                       Social Drivers of Health (SDOH) Interventions SDOH Screenings   Food Insecurity: No Food Insecurity (09/14/2023)  Housing: Unknown (09/14/2023)  Transportation Needs: No Transportation Needs (09/14/2023)  Utilities: Not At Risk (09/14/2023)  Alcohol Screen: Low Risk  (09/29/2022)  Depression (PHQ2-9): Medium Risk (09/14/2023)  Financial Resource Strain: Low Risk  (04/22/2023)   Received from [facility]  Physical Activity: Insufficiently Active (09/29/2022)  Social Connections: Moderately Integrated (09/14/2023)  Recent Concern: Social Connections - Moderately Isolated (09/05/2023)  Stress: No Stress Concern Present (09/29/2022)  Tobacco Use: Low Risk  (09/14/2023)  Health Literacy: Adequate Health Literacy (09/29/2022)     Readmission Risk Interventions     No data to display

## 2023-09-16 NOTE — Progress Notes (Signed)
 Sarah [name] to be D/C'd Skilled nursing facility per MD order.  Discussed with the patient and all questions fully answered.  VSS, Skin clean, dry and intact without evidence of skin break down, no evidence of skin tears noted. IV catheter discontinued intact. Site without signs and symptoms of complications. Dressing and pressure applied.  An After Visit Summary was printed and given to the patient. Patient received signed prescription.  D/c education completed with patient/family including follow up instructions, medication list, d/c activities limitations if indicated, with other d/c instructions as indicated by MD - patient able to verbalize understanding, all questions fully answered.   Report called to [place], RN  Patient instructed to return to ED, call 911, or call MD for any changes in condition.   Patient escorted via WC, and D/C to [facility] via private auto.    Sarah [name] 09/16/2023 4:24 PM

## 2023-09-16 NOTE — TOC Progression Note (Signed)
 Transition of Care [facility]) - Progression Note    Patient Details  Name: Sarah [name] MRN: [id number] Date of Birth: [birth year]  Transition [facility] Care [facility]) CM/SW Contact  Corean [name], RN Phone Number: 09/16/2023, 9:35 AM  Clinical Narrative:      Care without  delay order noted Auth obtained for [facility] pending, clinical uploaded  [facility] confirms patient can not admit until PASRR received                    Expected Discharge Plan and Services         Expected Discharge Date: 09/16/23                                     Social Drivers of Health (SDOH) Interventions SDOH Screenings   Food Insecurity: No Food Insecurity (09/14/2023)  Housing: Unknown (09/14/2023)  Transportation Needs: No Transportation Needs (09/14/2023)  Utilities: Not At Risk (09/14/2023)  Alcohol Screen: Low Risk  (09/29/2022)  Depression (PHQ2-9): Medium Risk (09/14/2023)  Financial Resource Strain: Low Risk  (04/22/2023)   [facility]  Physical Activity: Insufficiently Active (09/29/2022)  Social Connections: Moderately Integrated (09/14/2023)  Recent Concern: Social Connections - Moderately Isolated (09/05/2023)  Stress: No Stress Concern Present (09/29/2022)  Tobacco Use: Low Risk  (09/14/2023)  Health Literacy: Adequate Health Literacy (09/29/2022)    Readmission Risk Interventions     No data to display

## 2023-09-16 NOTE — Discharge Summary (Signed)
 Physician Discharge Summary   Patient: Sarah [name] MRN: [id number] DOB: 10/11/1950  Admit date:     09/14/2023  Discharge date: 09/16/23  Discharge Physician: Cresencio Fairly   PCP: Valerio Melanie DASEN, NP   Recommendations at discharge:    F/up with outpt providers as requested  Discharge Diagnoses: Principal Problem:   Syncope Active Problems:   Leukocytosis   Acute kidney injury superimposed on chronic kidney disease (HCC)   DRESS syndrome   Acute hypoxic respiratory failure (HCC)   Hypertension associated with diabetes (HCC)   DM2 (diabetes mellitus, type 2) (HCC)   Adrenal insufficiency (Addison's disease) (HCC)   (HFpEF) heart failure with preserved ejection fraction (HCC)  Resolved Problems:   SIRS (systemic inflammatory response syndrome) [facility])  Hospital Course: Assessment and Plan:  73 y.o. female with medical history significant of T2DM with neuropathy, depression, adrenal insufficiency, malignant melanoma, chronic HFpEF, hyperlipidemia, stage III CKD with recent admission for anaphylaxis, dress syndrome, acute hypoxic respiratory failure with concern for pneumonia presenting with syncope    8/27: PT, OT eval - SNF     Assessment & Plan:   Principal Problem:   Syncope Active Problems:   Leukocytosis   Acute kidney injury superimposed on chronic kidney disease (HCC)   DRESS syndrome   Acute hypoxic respiratory failure (HCC)   Hypertension associated with diabetes (HCC)   DM2 (diabetes mellitus, type 2) (HCC)   Adrenal insufficiency (Addison's disease) (HCC)   (HFpEF) heart failure with preserved ejection fraction (HCC)   * PreSyncope/Syncope Noted syncopal episodes x 2 with outpatient PCP follow-up Symptoms predominantly going from sitting to standing Markedly dry in exam on admission but improving with hydration. Suspect multifactorial contributions of dehydration, adrenal insufficiency Stress dose Solu-Cortef  - change to home dose at DC. She is not  interested in high dose steroids. 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   Acute kidney injury superimposed on chronic kidney disease (HCC) Creatinine improving with hydration. Likely prerenal etio due to dehydration Recent Labs       Lab Results  Component Value Date    CREATININE 1.20 (H) 09/15/2023    CREATININE 1.77 (H) 09/14/2023    CREATININE 1.61 (H) 09/14/2023       DRESS syndrome Recent admission August 16 through 19 for issues including anaphylaxis and dress syndrome Appears clinically resolved at present No reported itching at present. Skin sloughing improved Status post steroid taper   Acute hypoxic respiratory failure (HCC) Noted recent admission for acute respiratory failure with hypoxia in the setting of pneumonia-resolved No active hypoxia present Chest x-ray within normal limits   (HFpEF) heart failure with preserved ejection fraction (HCC) 2D echo August 2025 with EF of 55 to 60% and grade 2 diastolic dysfunction Appears clinically dry in setting of syncopal event x 2. Now hydrated and eating/drinking   Adrenal insufficiency (Addison's disease) (HCC) Appears to have acute on chronic adrenal sufficiency in the setting of syncopal event x 2 today Clinically dry but improving with hydration. Given stress dose Solu-Cortef  while in the Hospital but  she is no longer would want high dose steroids and requesting to change to her home dose   DM2 (diabetes mellitus, type 2) (HCC)   Hypertension associated with diabetes  (HCC) Noted orthostatic symptoms on presentation  Stop Losartan  at DC and adjust other meds as an outpt Encouraged PO nutrition    Unintentional wt loss She reports poor appetite and wt loss of > 20 lbs in a month. normal TSH on 09/04/23 She shares that she has chronic back pain for over 20 years and worse in last 2 years which is contributing to her poor appetite. She has scheduled appt with her ortho tomorrow.         Disposition: Skilled nursing facility Diet recommendation:  Carb modified diet DISCHARGE MEDICATION: Allergies as of 09/16/2023       Reactions   Clindamycin/lincomycin Anaphylaxis   Doxycycline  Rash   Aspirin  Other (See Comments)   Pt is unsure of reaction, but states that she is allergic to aspirin    Ciprofloxacin Rash   very bad rash   Penicillins Rash   Has patient had a PCN reaction causing immediate rash, facial/tongue/throat swelling, SOB or lightheadedness with hypotension: No Has patient had a PCN reaction causing severe rash involving mucus membranes or skin necrosis: No Has patient had a PCN reaction that required hospitalization: No Has patient had a PCN reaction occurring within the last 10 years: No If all of the above answers are NO, then may proceed with Cephalosporin use.        Medication List     STOP taking these medications    acetaminophen  500 MG tablet Commonly known as: TYLENOL    escitalopram  10 MG tablet Commonly known as: LEXAPRO    losartan  50 MG tablet Commonly known as: COZAAR        TAKE these medications    amitriptyline  50 MG tablet Commonly known as: ELAVIL  Take 1 tablet (50 mg total) by mouth at bedtime.   amLODipine  2.5 MG tablet Commonly known as: NORVASC  Take 1 tablet (2.5 mg total) by mouth daily.   Baclofen  5 MG Tabs TAKE 1 TABLET BY MOUTH EVERY 8 HOURS AS NEEDED   Belsomra  20 MG Tabs Generic drug: Suvorexant  Take 1 tablet (20 mg total) by mouth at bedtime.   diphenhydrAMINE  25 mg  capsule Commonly known as: BENADRYL  Take 1 capsule (25 mg total) by mouth every 6 (six) hours as needed for itching.   EPINEPHrine  0.3 mg/0.3 mL Soaj injection Commonly known as: EpiPen  2-Pak Inject 0.3 mg into the muscle as needed for anaphylaxis.   FIBER ADULT GUMMIES PO Take 1 tablet by mouth daily as needed.   furosemide  40 MG tablet Commonly known as: LASIX  TAKE 1 TABLET BY MOUTH EVERY DAY   HYDROcodone -acetaminophen  5-325 MG tablet Commonly known as: NORCO/VICODIN Take 1 tablet by mouth every 6 (six) hours as needed for moderate pain (pain score 4-6).   hydrocortisone  5 MG tablet Commonly known as: CORTEF  TAKE THREE TABLETS IN THE MORNING AND ONE TABLET IN THE AFTERNOON   meclizine  25 MG tablet Commonly known as: ANTIVERT  Take 1 tablet (25 mg total) by mouth 3 (three) times daily as needed for dizziness.   metFORMIN  1000 MG tablet Commonly known as: GLUCOPHAGE  Take 1 tablet (1,000 mg total) by mouth 2 (two) times daily with a meal.   OneTouch UltraSoft 2 Lancets Misc USE TO CHECK BLOOD SUGAR 3 TIMES A DAY AND DOCUMENT RESULTS, BRING TO APPOINTMENTS. GOAL IS <130 FASTING BLOOD SUGAR AND <180 TWO HOURS  AFTER MEALS.   OneTouch Verio w/Device Kit Use to check blood sugar 3 times a day and document results, bring to appointments.  Goal is <130 fasting blood sugar and <180 two hours after meals.   pregabalin  150 MG capsule Commonly known as: LYRICA  150 mg in am, 150 MG in afternoon, and 300 mg in pm   rosuvastatin  10 MG tablet Commonly known as: CRESTOR  Take 1 tablet (10 mg total) by mouth daily.   VITAMIN B-12 PO Take 1 tablet by mouth at bedtime.   Vitamin D  50 MCG (2000 UT) Caps Take 1 capsule by mouth at bedtime.        Contact information for follow-up providers     Valerio Melanie DASEN, NP. Schedule an appointment as soon as possible for a visit in 1 week(s).   Specialty: Nurse Practitioner Why: [facility] Discharge F/UP Contact information: [address] [place] [address] [phone]         Legrand Victory LITTIE DOUGLAS, MD. Schedule an appointment as soon as possible for a visit in 2 week(s).   Specialty: Gastroenterology Why: [facility] Discharge F/UP Contact information: [address] [place] [address] [phone]         Phebe Janina Pates, MD. Schedule an appointment as soon as possible for a visit in 2 week(s).   Specialty: Endocrinology Why: [facility] Discharge F/UP Contact information: [address] [place] [address] [phone]              Contact information for after-discharge care     Destination     [facility] .   Service: Skilled Nursing Contact information: [address] [place]  [address] [phone]                    Discharge Exam: Fredricka Weights   09/14/23 1206  Weight: 102.1 kg   General exam: Appears calm and comfortable  Respiratory system: Clear to auscultation. Respiratory effort normal. Cardiovascular system: S1 & S2 heard, RRR. No JVD, murmurs, rubs, gallops or clicks. No pedal edema. Gastrointestinal system: Abdomen is soft, benign Central nervous system: Alert and oriented. No focal neurological deficits. Extremities: Symmetric 5 x 5 power. Skin: No rashes, lesions or ulcers Psychiatry: Judgement and insight appear normal. Mood & affect appropriate.   Condition at discharge: fair  The results of significant diagnostics from this hospitalization (including imaging, microbiology, ancillary and laboratory) are listed below for reference.   Imaging Studies: MR BRAIN WO CONTRAST Result Date: 09/14/2023 CLINICAL DATA:  Syncope/presyncope, cerebrovascular cause suspected EXAM: MRI HEAD WITHOUT CONTRAST TECHNIQUE: Multiplanar, multiecho pulse sequences of the brain and surrounding structures were obtained without intravenous contrast. COMPARISON:  CT head 08/30/2023. FINDINGS: Brain: No acute infarction, hemorrhage,  hydrocephalus, extra-axial collection or mass lesion. Remote left cerebellar infarct. Vascular: Normal flow voids. Skull and upper cervical spine: Normal marrow signal. Sinuses/Orbits: Negative. Other: No mastoid effusions. IMPRESSION: 1. No evidence of acute intracranial abnormality. 2. Remote left cerebellar infarct. Electronically Signed   By: Gilmore GORMAN Molt M.D.   On: 09/14/2023 17:14   DG Chest Port 1 View Result Date: 09/14/2023 EXAM: 1 VIEW XRAY OF THE CHEST 09/14/2023 12:33:12 PM COMPARISON: 09/04/2023 CLINICAL HISTORY: 141880 SOB (shortness of breath) [id number]. two syncopal episodes while at her doctor's office today. Pt with no reports of hitting her head. Pt has been feeling weak the past week. Pt was was discharged from the hospital a week ago with pnx. Pt  with complaints of chronic pain in her back 7/10. ; hypotensive FINDINGS: LUNGS AND PLEURA: Atelectasis versus scar noted in the right lung base. No pulmonary edema. No pleural effusion. No pneumothorax. HEART AND MEDIASTINUM: No acute abnormality of the cardiac and mediastinal silhouettes. BONES AND SOFT TISSUES: No acute osseous abnormality. IMPRESSION: 1. Atelectasis versus scar in the right lung base. Electronically signed by: Waddell Calk MD 09/14/2023 01:08 PM EDT RP Workstation: [id number]   ECHOCARDIOGRAM COMPLETE Result Date: 09/05/2023    ECHOCARDIOGRAM REPORT   Patient Name:   Sarah [name] Date of Exam: 09/05/2023 Medical Rec #:  [id number]    Height:       64.0 in Accession #:    [id number]   Weight:       232.0 lb Date of Birth:  [birth year]    BSA:          2.084 m Patient Age:    73 years     BP:           91/47 mmHg Patient Gender: F            HR:           67 bpm. Exam Location:  [facility] Procedure: 2D Echo, Cardiac Doppler and Color Doppler (Both Spectral and Color            Flow Doppler were utilized during procedure). Indications:     CHF- Acute Systolic I50.21  History:         Patient has no prior history of Echocardiogram  examinations.  Sonographer:     Bernice Rubinstein RDCS Referring Phys:  [id number] PRENTICE BROCKS CORE Diagnosing Phys: Cara JONETTA Lovelace MD  Sonographer Comments: Suboptimal subcostal window and patient is obese. Image acquisition challenging due to respiratory motion. IMPRESSIONS  1. Left ventricular ejection fraction, by estimation, is 55 to 60%. The left ventricle has normal function. The left ventricle has no regional wall motion abnormalities. Left ventricular diastolic parameters are consistent with Grade II diastolic dysfunction (pseudonormalization).  2. Right ventricular systolic function is normal. The right ventricular size is normal.  3. The mitral valve is normal in structure. No evidence of mitral valve regurgitation.  4. The aortic valve is normal in structure. Aortic valve regurgitation is not visualized. FINDINGS  Left Ventricle: Left ventricular ejection fraction, by estimation, is 55 to 60%. The left ventricle has normal function. The left ventricle has no regional wall motion abnormalities. Strain was performed and the global longitudinal strain is indeterminate. The left ventricular internal cavity size was normal in size. There is no left ventricular hypertrophy. Left ventricular diastolic parameters are consistent with Grade II diastolic dysfunction (pseudonormalization). Right Ventricle: The right ventricular size is normal. No increase in right ventricular wall thickness. Right ventricular systolic function is normal. Left Atrium: Left atrial size was normal in size. Right Atrium: Right atrial size was normal in size. Pericardium: There is no evidence of pericardial effusion. Mitral Valve: The mitral valve is normal in structure. No evidence of mitral valve regurgitation. Tricuspid Valve: The tricuspid valve is normal in structure. Tricuspid valve regurgitation is trivial. Aortic Valve: The aortic valve is normal in structure. Aortic valve regurgitation is not visualized. Aortic valve peak gradient  measures 8.2 mmHg. Pulmonic Valve: The pulmonic valve was normal in structure. Pulmonic valve regurgitation is not visualized. Aorta: The ascending aorta was not well visualized. IAS/Shunts: No atrial level shunt detected by color flow Doppler. Additional Comments: 3D was performed not requiring image post processing on an independent  workstation and was indeterminate.  LEFT VENTRICLE PLAX 2D LVIDd:         5.40 cm   Diastology LVIDs:         3.70 cm   LV e' medial:    9.03 cm/s LV PW:         1.00 cm   LV E/e' medial:  14.0 LV IVS:        1.00 cm   LV e' lateral:   12.40 cm/s LVOT diam:     2.00 cm   LV E/e' lateral: 10.2 LV SV:         91 LV SV Index:   44 LVOT Area:     3.14 cm  RIGHT VENTRICLE RV Basal diam:  2.60 cm RV S prime:     9.32 cm/s TAPSE (M-mode): 2.1 cm LEFT ATRIUM             Index        RIGHT ATRIUM           Index LA diam:        4.10 cm 1.97 cm/m   RA Area:     13.40 cm LA Vol (A2C):   61.2 ml 29.37 ml/m  RA Volume:   34.10 ml  16.36 ml/m LA Vol (A4C):   35.6 ml 17.08 ml/m LA Biplane Vol: 47.7 ml 22.89 ml/m  AORTIC VALVE                 PULMONIC VALVE AV Area (Vmax): 2.55 cm     PV Vmax:        0.83 m/s AV Vmax:        143.00 cm/s  PV Peak grad:   2.8 mmHg AV Peak Grad:   8.2 mmHg     RVOT Peak grad: 1 mmHg LVOT Vmax:      116.00 cm/s LVOT Vmean:     80.700 cm/s LVOT VTI:       0.289 m  AORTA Ao Root diam: 2.80 cm Ao Asc diam:  3.10 cm MITRAL VALVE MV Area (PHT): 4.86 cm     SHUNTS MV Decel Time: 156 msec     Systemic VTI:  0.29 m MV E velocity: 126.00 cm/s  Systemic Diam: 2.00 cm MV A velocity: 77.80 cm/s MV E/A ratio:  1.62 Cara JONETTA Lovelace MD Electronically signed by Cara JONETTA Lovelace MD Signature Date/Time: 09/05/2023/2:52:20 PM    Final    US  Abdomen Complete Result Date: 09/04/2023 CLINICAL DATA:  Splenomegaly EXAM: ABDOMEN ULTRASOUND COMPLETE COMPARISON:  CT 08/30/2023 FINDINGS: Gallbladder: Prior cholecystectomy. Common bile duct: Diameter: Common bile duct not visualized,  normal caliber on recent CT. Liver: Hepatomegaly. Diffusely increased echotexture compatible with fatty infiltration. No focal abnormality. Portal vein is patent on color Doppler imaging with normal direction of blood flow towards the liver. IVC: No abnormality visualized. Pancreas: Visualized portion unremarkable. Spleen: Normal size with a craniocaudal length of 11.8 cm. No focal abnormality. Right Kidney: Length: 11.1 cm. Echogenicity within normal limits. No mass or hydronephrosis visualized. Left Kidney: Length: 11.8 cm. Echogenicity within normal limits. No mass or hydronephrosis visualized. Abdominal aorta: No aneurysm visualized. Other findings: None. IMPRESSION: Hepatomegaly. Diffusely increased echotexture compatible with fatty infiltration. No evidence of splenomegaly. Electronically Signed   By: Franky Crease M.D.   On: 09/04/2023 23:37   CT Angio Chest PE W and/or Wo Contrast Result Date: 09/04/2023 CLINICAL DATA:  Hypoxic EXAM: CT ANGIOGRAPHY CHEST WITH CONTRAST TECHNIQUE: Multidetector CT imaging of the  chest was performed using the standard protocol during bolus administration of intravenous contrast. Multiplanar CT image reconstructions and MIPs were obtained to evaluate the vascular anatomy. RADIATION DOSE REDUCTION: This exam was performed according to the departmental dose-optimization program which includes automated exposure control, adjustment of the mA and/or kV according to patient size and/or use of iterative reconstruction technique. CONTRAST:  OMNIPAQUE  IOHEXOL  350 MG/ML SOLN COMPARISON:  Chest x-ray 09/04/2023, CT 08/30/2023 FINDINGS: Cardiovascular: Satisfactory opacification of the pulmonary arteries to the segmental level. No evidence of pulmonary embolism. Mild aortic atherosclerosis. No aneurysm or dissection. Coronary vascular calcification. Mild cardiomegaly. No pericardial effusion Mediastinum/Nodes: Patent trachea. No thyroid  mass. Mildly enlarged right paratracheal node  measuring 10 mm, slightly increased compared to prior. Esophagus within normal limits. Lungs/Pleura: Interval trace bilateral effusions. Dependent atelectasis in the lower lobes. Mild diffuse bilateral hazy pulmonary density could be due to mild edema. More patchy and organized airspace disease in the lower lobes could reflect edema or pneumonia. Punctate pulmonary nodules, largest is seen in the left upper lobe and measures 3 mm on series 5, image 68. Upper Abdomen: No acute finding.  Hepatic steatosis Musculoskeletal: No acute or suspicious osseous abnormality Review of the MIP images confirms the above findings. IMPRESSION: 1. Negative for acute pulmonary embolus. 2. Interval trace bilateral effusions with dependent atelectasis in the lower lobes. Mild diffuse bilateral hazy pulmonary density could be due to mild edema. More patchy and organized airspace disease in the lower lobes could reflect edema or pneumonia. 3. Mildly enlarged right paratracheal node, slightly increased compared to prior, likely reactive. 4. Punctate pulmonary nodules measuring up to 3 mm. No follow-up needed if patient is low-risk (and has no known or suspected primary neoplasm). Non-contrast chest CT can be considered in 12 months if patient is high-risk. This recommendation follows the consensus statement: Guidelines for Management of Incidental Pulmonary Nodules Detected on CT Images: From the Fleischner Society 2017; Radiology 2017; 284:228-243. 5. Aortic atherosclerosis. Aortic Atherosclerosis (ICD10-I70.0). Electronically Signed   By: Luke Bun M.D.   On: 09/04/2023 20:16   DG Chest Portable 1 View Result Date: 09/04/2023 CLINICAL DATA:  Hypoxia EXAM: PORTABLE CHEST 1 VIEW COMPARISON:  August 30, 2023 FINDINGS: Patchy opacities at the bilateral lung bases, right greater than left. No definite pleural effusions. No pneumothorax. Unchanged cardiomediastinal silhouette. No acute osseous findings. IMPRESSION: Patchy opacities at  the bilateral lung bases, may represent infection and/or atelectasis. Electronically Signed   By: Michaeline Blanch M.D.   On: 09/04/2023 18:05   CT CHEST ABDOMEN PELVIS WO CONTRAST Result Date: 08/30/2023 CLINICAL DATA:  Rash, shortness of breath, hypotension. EXAM: CT CHEST, ABDOMEN AND PELVIS WITHOUT CONTRAST TECHNIQUE: Multidetector CT imaging of the chest, abdomen and pelvis was performed following the standard protocol without IV contrast. RADIATION DOSE REDUCTION: This exam was performed according to the departmental dose-optimization program which includes automated exposure control, adjustment of the mA and/or kV according to patient size and/or use of iterative reconstruction technique. COMPARISON:  CT abdomen pelvis 09/04/2017. FINDINGS: CT CHEST FINDINGS Cardiovascular: Atherosclerotic calcification of the aorta, aortic valve and coronary arteries. Heart is enlarged. No pericardial effusion. Mediastinum/Nodes: No pathologically enlarged mediastinal or axillary lymph nodes. Hilar regions are difficult to definitively evaluate without IV contrast. Multiple subpectoral lymph nodes are not considered enlarged by CT size criteria but are noted. Esophagus is grossly unremarkable. Lungs/Pleura: Image quality is degraded by expiratory phase imaging and respiratory motion. 2 mm apical segment right upper lobe nodule (4/34) and 3 mm  lingular nodule (4/68). Minimal dependent atelectasis in the right lower lobe. No pleural fluid. Airway is otherwise unremarkable. Musculoskeletal: Degenerative changes in the spine. CT ABDOMEN PELVIS FINDINGS Hepatobiliary: Liver is decreased in attenuation diffusely and is enlarged, 20.4 cm. Cholecystectomy. No biliary ductal dilatation. Pancreas: Negative. Spleen: Negative. Adrenals/Urinary Tract: Adrenal glands and kidneys are unremarkable. Ureters are decompressed. Bladder is grossly unremarkable. Stomach/Bowel: Tiny hiatal hernia. Stomach, small bowel, appendix and colon are  unremarkable. Vascular/Lymphatic: Atherosclerotic calcification of the aorta. No pathologically enlarged lymph nodes. Small left inguinal lymph nodes are unchanged. Surgical clips in the right groin. Reproductive: Hysterectomy.  No adnexal mass. Other: No free fluid. Musculoskeletal: Degenerative changes in the spine. L4-S1 posterior lumbar interbody fusion. IMPRESSION: 1. No acute findings. 2. Steatotic enlarged liver. 3. Tiny upper lobe pulmonary nodules. No follow-up needed if patient is low-risk (and has no known or suspected primary neoplasm). Non-contrast chest CT can be considered in 12 months if patient is high-risk. This recommendation follows the consensus statement: Guidelines for Management of Incidental Pulmonary Nodules Detected on CT Images: From the Fleischner Society 2017; Radiology 2017; 284:228-243. 4. Aortic atherosclerosis (ICD10-I70.0). Coronary artery calcification. Electronically Signed   By: Newell Eke M.D.   On: 08/30/2023 15:39   CT Head Wo Contrast Result Date: 08/30/2023 CLINICAL DATA:  Hypotensive with rash and shortness of breath. EXAM: CT HEAD WITHOUT CONTRAST TECHNIQUE: Contiguous axial images were obtained from the base of the skull through the vertex without intravenous contrast. RADIATION DOSE REDUCTION: This exam was performed according to the departmental dose-optimization program which includes automated exposure control, adjustment of the mA and/or kV according to patient size and/or use of iterative reconstruction technique. COMPARISON:  September 03, 2017 FINDINGS: Brain: There is generalized cerebral atrophy with widening of the extra-axial spaces and ventricular dilatation. There are areas of decreased attenuation within the white matter tracts of the supratentorial brain, consistent with microvascular disease changes. A chronic left cerebellar infarct is noted. Vascular: No hyperdense vessel or unexpected calcification. Skull: Normal. Negative for fracture or focal  lesion. Sinuses/Orbits: No acute finding. Other: None. IMPRESSION: 1. Generalized cerebral atrophy with chronic white matter small vessel ischemic changes. 2. Chronic left cerebellar infarct. 3. No acute intracranial abnormality. Electronically Signed   By: [name] Dials M.D.   On: 08/30/2023 15:36   DG Chest Portable 1 View Result Date: 08/30/2023 CLINICAL DATA:  Sepsis EXAM: PORTABLE CHEST - 1 VIEW COMPARISON:  October 04, 2017 FINDINGS: Low lung volumes. Patient is rotated towards the right. No focal airspace consolidation, pleural effusion, or pneumothorax. The cardiac silhouette is at the upper limits of normal, likely accentuated by AP technique and low lung volumes. Tortuous aorta with aortic atherosclerosis. No acute fracture or destructive lesions. Multilevel thoracic osteophytosis. IMPRESSION: No acute cardiopulmonary abnormality. Electronically Signed   By: Rogelia Myers M.D.   On: 08/30/2023 13:39    Microbiology: Results for orders placed or performed during the hospital encounter of 09/14/23  Blood culture (routine single)     Status: None (Preliminary result)   Collection Time: 09/14/23  1:13 PM   Specimen: BLOOD  Result Value Ref Range Status   Specimen Description BLOOD BLOOD RIGHT FOREARM  Final   Special Requests   Final    BOTTLES DRAWN AEROBIC AND ANAEROBIC Blood Culture results may not be optimal due to an inadequate volume of blood received in culture bottles   Culture   Final    NO GROWTH < 24 HOURS Performed at [facility], [address] [place], [place] [address]    Report Status PENDING  Incomplete  Blood culture (single)     Status: None (Preliminary result)   Collection Time: 09/14/23  7:05 PM   Specimen: BLOOD  Result Value Ref Range Status   Specimen Description BLOOD BLOOD LEFT HAND [facility]  Final   Special Requests   Final    BOTTLES DRAWN AEROBIC AND ANAEROBIC Blood Culture adequate volume   Culture   Final    NO GROWTH < 12  HOURS Performed at [facility], [address] [place], [place] [address]    Report Status PENDING  Incomplete  Culture, blood (single)     Status: None (Preliminary result)   Collection Time: 09/14/23  7:20 PM   Specimen: BLOOD  Result Value Ref Range Status   Specimen Description BLOOD BLOOD RIGHT HAND [facility]  Final   Special Requests   Final    BOTTLES DRAWN AEROBIC ONLY Blood Culture adequate volume   Culture   Final    NO GROWTH < 12 HOURS Performed at [facility], [address] [place], [place] [address]    Report Status PENDING  Incomplete    Labs: CBC: Recent Labs  Lab 09/14/23 1045 09/14/23 1209 09/15/23 0600 09/16/23 0641  WBC 11.8* 15.7* 6.7 4.7  HGB 12.3 12.0 11.4* 10.6*  HCT 36.7 36.8 34.9* 31.7*  MCV 88 89.8 88.4 87.6  PLT 235 245 225 167   Basic Metabolic Panel: Recent Labs  Lab 09/14/23 1044 09/14/23 1209 09/15/23 0600 09/16/23 0641  NA 142 143 139 141  K 3.6 3.7 3.6 3.6  CL 97 98 99 102  CO2 22 30 28 26   GLUCOSE 175* 155* 170* 199*  BUN 17 20 18 17   CREATININE 1.61* 1.77* 1.20* 0.93  CALCIUM  9.0 9.1 8.4* 8.7*  MG  --  1.6*  --   --    Liver Function Tests: Recent Labs  Lab 09/14/23 1044 09/14/23 1209 09/15/23 0600 09/16/23 0641  AST 12 18 11* 12*  ALT 19 15 17 14   ALKPHOS 75 48 54 49  BILITOT 0.6 1.1 1.2 0.6  PROT 6.0 5.8* 6.4* 5.8*  ALBUMIN 4.0 2.9* 3.4* 3.1*   CBG: Recent Labs  Lab 09/14/23 1950 09/16/23 0748  GLUCAP 213* 191*    Discharge time spent: greater than 30 minutes.  Signed: Cresencio Fairly, MD [facility] 09/16/2023

## 2023-09-16 NOTE — Progress Notes (Addendum)
 Occupational Therapy Treatment Patient Details Name: Sarah [name] MRN: [id number] DOB: 1950/10/08 Today's Date: 09/16/2023   History of present illness Pt is a 73 y/o F presenting to ED after 2 episodes of syncope at PCP office. Pt has had 2 prior admissions in the last month, from 8/11-8/15 and 8/16-8/19. PMH significant for T2DM, depression, adrenal insufficiency, malignant malinoma, chronic HFpEF, HLD, CKD-III, DRESS Syndrome.   OT comments  Pt seen for OT treatment on this date. Upon arrival to room pt seated EOB, agreeable to tx. Pt requires verbal cuing and education for safety awareness with recommendation to utilize RW to access bathroom however pt was connected to IV and opted to managing IV pole to access bathroom with SBA, completed all components of toileting and toilet transfers with SBA.  Pt making good progress toward goals, will continue to follow POC. Discharge recommendation remains appropriate.        If plan is discharge home, recommend the following:  Assistance with cooking/housework;A little help with walking and/or transfers;A little help with bathing/dressing/bathroom;Help with stairs or ramp for entrance;Assist for transportation   Equipment Recommendations  Other (comment) (Defer to next venue of care.)    Recommendations for Other Services      Precautions / Restrictions Precautions Precautions: Fall Recall of Precautions/Restrictions: Intact Restrictions Weight Bearing Restrictions Per Provider Order: No       Mobility Bed Mobility           Sit to supine: Modified independent (Device/Increase time), HOB elevated, Used rails   General bed mobility comments: Sitting EOB at start of session    Transfers Overall transfer level: Needs assistance Equipment used:  (Managed IV line during mobility to access bathroom) Transfers: Sit to/from Stand Sit to Stand: Supervision                 Balance Overall balance assessment: Needs  assistance Sitting-balance support: Feet supported Sitting balance-Leahy Scale: Good     Standing balance support: Single extremity supported, During functional activity Standing balance-Leahy Scale: Good Standing balance comment: dynamic standing for clothing management following toileting, standing at sink for hand hygiene                           ADL either performed or assessed with clinical judgement   ADL Overall ADL's : Needs assistance/impaired                         Toilet Transfer: Supervision/safety;Regular Toilet;Grab bars Toilet Transfer Details (indicate cue type and reason): While managing IV pole to access bathroom Toileting- Clothing Manipulation and Hygiene: Supervision/safety;Sit to/from stand         General ADL Comments: Ambulates while managing IV pole with SBA    Extremity/Trunk Assessment Upper Extremity Assessment Upper Extremity Assessment: Generalized weakness            Vision Patient Visual Report: No change from baseline     Perception     Praxis     Communication Communication Communication: No apparent difficulties   Cognition Arousal: Alert Behavior During Therapy: WFL for tasks assessed/performed Cognition: No apparent impairments             OT - Cognition Comments: A/Ox4                 Following commands: Intact        Cueing   Cueing Techniques: Verbal cues, Visual cues  Exercises Other Exercises  Other Exercises: Education on safety awareness during ADL task engagement.    Shoulder Instructions       General Comments      Pertinent Vitals/ Pain       Pain Assessment Pain Assessment: No/denies pain  Home Living                                          Prior Functioning/Environment              Frequency  Min 2X/week        Progress Toward Goals  OT Goals(current goals can now be found in the care plan section)  Progress towards OT goals:  Progressing toward goals  Acute Rehab OT Goals Potential to Achieve Goals: Good  Plan      Co-evaluation                 AM-PAC OT 6 Clicks Daily Activity     Outcome Measure   Help from another person eating meals?: None Help from another person taking care of personal grooming?: None Help from another person toileting, which includes using toliet, bedpan, or urinal?: None Help from another person bathing (including washing, rinsing, drying)?: None Help from another person to put on and taking off regular upper body clothing?: None Help from another person to put on and taking off regular lower body clothing?: A Little 6 Click Score: 23    End of Session    OT Visit Diagnosis: Unsteadiness on feet (R26.81)   Activity Tolerance Patient tolerated treatment well   Patient Left in bed;with call bell/phone within reach   Nurse Communication Mobility status        Time: 8741-8686 OT Time Calculation (min): 15 min  Charges: OT General Charges $OT Visit: 1 Visit OT Treatments $Self Care/Home Management : 8-22 mins  Harlene Sharps OTR/L   Harlene LITTIE Sharps 09/16/2023, 1:23 PM

## 2023-09-16 NOTE — Telephone Encounter (Signed)
 Attempted to reach patient to discuss sooner f/u visit. No answer, no vm. Tentative appointment scheduled for 09/23/2023 at 2:30 with [facility]. Will attempt to call pt back to discuss this appt.

## 2023-09-17 ENCOUNTER — Encounter: Payer: Self-pay | Admitting: Student

## 2023-09-17 ENCOUNTER — Non-Acute Institutional Stay (SKILLED_NURSING_FACILITY): Payer: Self-pay | Admitting: Student

## 2023-09-17 DIAGNOSIS — E1169 Type 2 diabetes mellitus with other specified complication: Secondary | ICD-10-CM | POA: Diagnosis not present

## 2023-09-17 DIAGNOSIS — F3341 Major depressive disorder, recurrent, in partial remission: Secondary | ICD-10-CM

## 2023-09-17 DIAGNOSIS — I152 Hypertension secondary to endocrine disorders: Secondary | ICD-10-CM

## 2023-09-17 DIAGNOSIS — E271 Primary adrenocortical insufficiency: Secondary | ICD-10-CM

## 2023-09-17 DIAGNOSIS — E538 Deficiency of other specified B group vitamins: Secondary | ICD-10-CM

## 2023-09-17 DIAGNOSIS — E861 Hypovolemia: Secondary | ICD-10-CM | POA: Diagnosis not present

## 2023-09-17 DIAGNOSIS — E66812 Obesity, class 2: Secondary | ICD-10-CM

## 2023-09-17 DIAGNOSIS — I5030 Unspecified diastolic (congestive) heart failure: Secondary | ICD-10-CM | POA: Diagnosis not present

## 2023-09-17 DIAGNOSIS — N179 Acute kidney failure, unspecified: Secondary | ICD-10-CM

## 2023-09-17 DIAGNOSIS — T50905A Adverse effect of unspecified drugs, medicaments and biological substances, initial encounter: Secondary | ICD-10-CM

## 2023-09-17 DIAGNOSIS — E785 Hyperlipidemia, unspecified: Secondary | ICD-10-CM

## 2023-09-17 DIAGNOSIS — F99 Mental disorder, not otherwise specified: Secondary | ICD-10-CM

## 2023-09-17 DIAGNOSIS — N189 Chronic kidney disease, unspecified: Secondary | ICD-10-CM

## 2023-09-17 DIAGNOSIS — F5105 Insomnia due to other mental disorder: Secondary | ICD-10-CM

## 2023-09-17 DIAGNOSIS — M5442 Lumbago with sciatica, left side: Secondary | ICD-10-CM

## 2023-09-17 DIAGNOSIS — E1159 Type 2 diabetes mellitus with other circulatory complications: Secondary | ICD-10-CM

## 2023-09-17 DIAGNOSIS — D7212 Drug rash with eosinophilia and systemic symptoms syndrome: Secondary | ICD-10-CM

## 2023-09-17 DIAGNOSIS — G8929 Other chronic pain: Secondary | ICD-10-CM

## 2023-09-17 DIAGNOSIS — Z6836 Body mass index (BMI) 36.0-36.9, adult: Secondary | ICD-10-CM

## 2023-09-17 DIAGNOSIS — M5441 Lumbago with sciatica, right side: Secondary | ICD-10-CM

## 2023-09-17 MED ORDER — BELSOMRA 15 MG PO TABS: 15.0000 mg | ORAL_TABLET | Freq: Every evening | ORAL | 0 refills | Status: DC | PRN

## 2023-09-17 MED ORDER — PREGABALIN 150 MG PO CAPS: 150.0000 mg | ORAL_CAPSULE | Freq: Two times a day (BID) | ORAL | 0 refills | Status: DC

## 2023-09-17 NOTE — Telephone Encounter (Signed)
 Spoke with pt. She reports she was not in the hospital for a GI bleed. Reviewed discharge note from hospitalist. No mention of GI bleed. There is mention of 2 week f/u appt with GI. Did discuss this with pt. She states she does not feel like she needs a f/u with GI at this time and would like the appts cancelled. Cancelled.

## 2023-09-17 NOTE — Progress Notes (Signed)
 Provider:  Dr. Richerd Brigham Location:  Other [place].  Nursing Home Room Number: [facility] 116A Place of Service:  SNF (31)  PCP: Cannady, [name] T, NP Patient Care Team: Valerio Melanie DASEN, NP as PCP - General (Nurse Practitioner) [facility] (Optometry) Danis, Victory LITTIE MOULD, MD as Consulting Physician (Gastroenterology) Phebe Janina Pates, MD as Referring Physician (Endocrinology) Simers, Saralyn Caldron, NP as Nurse Practitioner (Neurology) [name], NP as Nurse Practitioner (Behavioral Health) [name], MD (Dermatology)  Extended Emergency Contact Information Primary Emergency Contact: Ferdie Iha  [place] Home Phone: [phone] Relation: Daughter  Code Status: Full Code.  Goals of Care: Advanced Directive information    09/14/2023   12:08 PM  Advanced Directives  Does Patient Have a Medical Advance Directive? No      Chief Complaint  Patient presents with   New Admit To SNF    Admission.     HPI: Patient is a 73 y.o. female seen today for admission to [facility] History of Present Illness The patient is a 73 year old with type 2 diabetes and adrenal insufficiency who presents with syncope.  She experienced syncope while at her doctor's office, fainting when attempting to sit on the examination table. Her daughter caught her as she fainted. No further episodes of syncope or confusion occurred during her hospital stay.  She takes metformin  and Lyrica  (pregabalin ) 150 mg twice daily. Hydrocodone  has been prescribed but has not been used recently. She takes hydrocortisone , three tablets in the morning and one in the afternoon. She recently completed a steroid taper following an admission for anaphylaxis.  She has a history of malignant melanoma, initially located on the bottom of her foot and later spread to glands in her groin area, surgically removed in 2017. She reports chronic back  pain for twenty years and poor appetite, with a weight loss of over twenty pounds in the last month.  She experienced anaphylaxis following a reaction to antibiotics prescribed after a root canal, leading to a hospital admission from August 16th to 19th. She has an EpiPen  available for emergencies.  She reports diarrhea, with four episodes in the morning of the visit, but denies having diarrhea regularly. She has been on amitriptyline  for depression and takes Belsomra  for sleep, reporting difficulty sleeping without it.  She has a history of hyperlipidemia and chronic kidney disease stage 3. She takes Crestor  10 mg daily. She reports a poor appetite since her hospital discharge, attributing it to a lack of time to eat. She wants to return home and resume her activities, including volunteering.  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 She volunteers at [employer] and [employer]. She is originally from [place]  and moved to   for school. Her husband passed away when he was 51. She has been living at [facility] for four years.  Family History - Husband: deceased at  age 40  Results DIAGNOSTIC Echocardiogram: Ejection Fraction (EF) 55-60%, Grade 2 diastolic dysfunction, no acute abnormality (08/27/2023) Electrocardiogram (EKG): Normal  Past Medical History:   Diagnosis Date   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HCC)    Adrenal insufficiency (HCC)    Anxiety    B12 deficiency    Cancer (HCC) 2018-2019   melanoma   Cervical radiculitis    Constipation    Dependent edema    Depression    Diabetes mellitus    PRE,NO MEDICATIONS AS OF 03/05/21   Dysmenorrhea    H/O: suicide attempt    History of antineoplastic chemotherapy    History of chemotherapy    Hyperlipidemia    Hypertension    Insomnia    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HCC)    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)    PMS (premenstrual syndrome)    Past Surgical History:  Procedure Laterality Date   BREAST BIOPSY Right    benign   CHOLECYSTECTOMY     COLONOSCOPY  11/08/2018   Danis-polyps   COLONOSCOPY  12/05/2019   Danis   DILATION AND CURETTAGE OF UTERUS     FOOT SURGERY Right 2018   removal of melanoma   LUMBAR FUSION     lymph node removal Right 2018   groin    POLYPECTOMY     TOTAL ABDOMINAL HYSTERECTOMY W/ BILATERAL SALPINGOOPHORECTOMY     TUBAL LIGATION      reports that she has never smoked. She has never been exposed to tobacco smoke. She has never used smokeless tobacco. She reports that she does not currently use alcohol. She reports that she does not use drugs. Social History   Socioeconomic History   Marital status: Widowed    Spouse name: Not on file   Number of children: 1   Years of education: 15   Highest education level: High school graduate  Occupational History   Occupation: Retired  Tobacco Use   Smoking status: Never    Passive exposure: Never   Smokeless tobacco: Never  Vaping Use   Vaping status: Never Used  Substance and Sexual Activity   Alcohol use: Not Currently   Drug use: Never   Sexual activity: Not Currently  Other Topics Concern   Not on file  Social History Narrative   Daughter lives in [place].  Patient lives [address] in [place].    Social Drivers of Corporate investment banker Strain: Low Risk   (04/22/2023)   Received from [facility]   Overall Financial Resource Strain (CARDIA)    Difficulty of Paying Living Expenses: Not hard at all  Food Insecurity: No Food Insecurity (09/14/2023)   Hunger Vital Sign    Worried About Running Out of Food in the Last Year: Never true    Ran Out of Food in the Last Year: Never true  Transportation Needs: No Transportation Needs (09/14/2023)   PRAPARE - Administrator, Civil Service (Medical): No    Lack of Transportation (Non-Medical): No  Physical Activity: Insufficiently Active (09/29/2022)   Exercise Vital Sign    Days of Exercise per Week: 3 days    Minutes of Exercise per Session: 40 min  Stress: No Stress Concern Present (09/29/2022)   [employer] of Occupational Health - Occupational Stress Questionnaire    Feeling of Stress : Not at all  Social Connections: Moderately Integrated (09/14/2023)   Social Connection and Isolation Panel    Frequency of Communication with Friends  and Family: More than three times a week    Frequency of Social Gatherings with Friends and Family: More than three times a week    Attends Religious Services: More than 4 times per year    Active Member of [employer] or Organizations: Yes    Attends Banker Meetings: Never    Marital Status: Widowed  Recent Concern: Social Connections - Moderately Isolated (09/05/2023)   Social Connection and Isolation Panel    Frequency of Communication with Friends and Family: More than three times a week    Frequency of Social Gatherings with Friends and Family: More than three times a week    Attends Religious Services: More than 4 times per year    Active Member of [employer] or Organizations: No    Attends Banker Meetings: Never    Marital Status: Widowed  Intimate Partner Violence: Not At Risk (09/14/2023)   Humiliation, Afraid, Rape, and Kick questionnaire    Fear of Current or Ex-Partner: No    Emotionally Abused: No     Physically Abused: No    Sexually Abused: No    Functional Status Survey:    Family History  Problem Relation Age of Onset   Depression Mother    Anxiety disorder Mother    Bipolar disorder Mother    Diabetes Mother    Mental illness Mother    Heart disease Mother    Stroke Mother    Lung cancer Father    Diabetes Maternal Grandmother    Heart attack Maternal Grandmother    CVA Paternal Grandmother    Colon cancer Neg Hx    Liver cancer Neg Hx    Pancreatic cancer Neg Hx    Esophageal cancer Neg Hx    Stomach cancer Neg Hx    Rectal cancer Neg Hx    Colon polyps Neg Hx    Breast cancer Neg Hx     Health Maintenance  Topic Date Due   Zoster Vaccines- Shingrix  (1 of 2) [birth year]/1970   OPHTHALMOLOGY EXAM  04/19/2021   Diabetic kidney evaluation - Urine ACR  03/20/2023   INFLUENZA VACCINE  08/20/2023   Medicare Annual Wellness (AWV)  09/29/2023   COVID-19 Vaccine (4 - 2024-25 season) 09/30/2023 (Originally 09/20/2022)   DTaP/Tdap/Td (3 - Td or Tdap) 04/14/2024 (Originally 02/17/2023)   HEMOGLOBIN A1C  03/02/2024   Colonoscopy  03/19/2024   FOOT EXAM  09/13/2024   Diabetic kidney evaluation - eGFR measurement  09/15/2024   MAMMOGRAM  10/11/2024   DEXA SCAN  10/03/2026   Pneumococcal Vaccine: 50+ Years  Completed   Hepatitis C Screening  Completed   HPV VACCINES  Aged Out   Meningococcal B Vaccine  Aged Out    Allergies  Allergen Reactions   Clindamycin/Lincomycin Anaphylaxis   Doxycycline  Rash   Aspirin  Other (See Comments)    Pt is unsure of reaction, but states that she is allergic to aspirin    Ciprofloxacin Rash    very bad rash   Penicillins Rash    Has patient had a PCN reaction causing immediate rash, facial/tongue/throat swelling, SOB or lightheadedness with hypotension: No Has patient had a PCN reaction causing severe rash involving mucus membranes or skin necrosis: No Has patient had a PCN reaction that required hospitalization: No Has patient had a PCN  reaction occurring within the last 10 years: No If all of the above answers are NO, then may proceed with Cephalosporin use.     Outpatient Encounter Medications as  of 09/17/2023  Medication Sig   amitriptyline  (ELAVIL ) 50 MG tablet Take 1 tablet (50 mg total) by mouth at bedtime.   amLODipine  (NORVASC ) 2.5 MG tablet Take 1 tablet (2.5 mg total) by mouth daily.   Baclofen  5 MG TABS TAKE 1 TABLET BY MOUTH EVERY 8 HOURS AS NEEDED   Blood Glucose Monitoring Suppl (ONETOUCH VERIO) w/Device KIT Use to check blood sugar 3 times a day and document results, bring to appointments.  Goal is <130 fasting blood sugar and <180 two hours after meals.   Cholecalciferol (VITAMIN D ) 50 MCG (2000 UT) CAPS Take 1 capsule by mouth at bedtime.   Cyanocobalamin (VITAMIN B-12 PO) Take 1 tablet by mouth at bedtime.   diphenhydrAMINE  (BENADRYL ) 25 mg capsule Take 1 capsule (25 mg total) by mouth every 6 (six) hours as needed for itching.   EPINEPHrine  (EPIPEN  2-PAK) 0.3 mg/0.3 mL IJ SOAJ injection Inject 0.3 mg into the muscle as needed for anaphylaxis.   FIBER ADULT GUMMIES PO Take 1 tablet by mouth daily as needed.   furosemide  (LASIX ) 40 MG tablet TAKE 1 TABLET BY MOUTH EVERY DAY   HYDROcodone -acetaminophen  (NORCO/VICODIN) 5-325 MG tablet Take 1 tablet by mouth every 6 (six) hours as needed for up to 3 days for moderate pain (pain score 4-6).   hydrocortisone  (CORTEF ) 5 MG tablet TAKE THREE TABLETS IN THE MORNING AND ONE TABLET IN THE AFTERNOON (Patient taking differently: Take 5 mg by mouth daily.)   meclizine  (ANTIVERT ) 25 MG tablet Take 1 tablet (25 mg total) by mouth 3 (three) times daily as needed for dizziness.   metFORMIN  (GLUCOPHAGE ) 1000 MG tablet Take 1 tablet (1,000 mg total) by mouth 2 (two) times daily with a meal.   OneTouch UltraSoft 2 Lancets MISC USE TO CHECK BLOOD SUGAR 3 TIMES A DAY AND DOCUMENT RESULTS, BRING TO APPOINTMENTS. GOAL IS <130 FASTING BLOOD SUGAR AND <180 TWO HOURS AFTER MEALS.    pregabalin  (LYRICA ) 150 MG capsule 150 mg in am, 150 MG in afternoon, and 300 mg in pm   rosuvastatin  (CRESTOR ) 10 MG tablet Take 1 tablet (10 mg total) by mouth daily.   Suvorexant  (BELSOMRA ) 20 MG TABS Take 1 tablet (20 mg total) by mouth at bedtime.   No facility-administered encounter medications on file as of 09/17/2023.    Review of Systems  Vitals:   09/17/23 0935 09/17/23 0958  BP: (!) 178/89 (!) 178/89  Pulse: 69   Resp: 18   Temp: (!) 97.1 F (36.2 C)   SpO2: 95%   Weight: 233 lb 3.2 oz (105.8 kg)   Height: 5' 4 (1.626 m)    Body mass index is 40.03 kg/m. Physical Exam Constitutional:      Appearance: Normal appearance.  Cardiovascular:     Rate and Rhythm: Normal rate and regular rhythm.     Pulses: Normal pulses.     Heart sounds: Normal heart sounds.  Pulmonary:     Effort: Pulmonary effort is normal.  Abdominal:     General: Abdomen is flat. Bowel sounds are normal.     Palpations: Abdomen is soft.  Musculoskeletal:        General: No swelling or tenderness.  Skin:    General: Skin is warm and dry.  Neurological:     Mental Status: She is alert and oriented to person, place, and time.     Gait: Gait normal.  Psychiatric:        Mood and Affect: Mood normal.  Labs reviewed: Basic Metabolic Panel: Recent Labs    08/31/23 0514 09/01/23 0350 09/02/23 0316 09/04/23 1755 09/14/23 1209 09/15/23 0600 09/16/23 0641  NA 135 137 141   < > 143 139 141  K 4.5 4.3 4.2   < > 3.7 3.6 3.6  CL 105 104 105   < > 98 99 102  CO2 20* 24 26   < > 30 28 26   GLUCOSE 244* 161* 143*   < > 155* 170* 199*  BUN 30* 27* 28*   < > 20 18 17   CREATININE 1.65* 1.18* 1.00   < > 1.77* 1.20* 0.93  CALCIUM  8.0* 8.2* 8.3*   < > 9.1 8.4* 8.7*  MG 1.7 2.1 2.2  --  1.6*  --   --   PHOS 3.1 2.5 2.3*  --   --   --   --    < > = values in this interval not displayed.   Liver Function Tests: Recent Labs    09/14/23 1209 09/15/23 0600 09/16/23 0641  AST 18 11* 12*  ALT  15 17 14   ALKPHOS 48 54 49  BILITOT 1.1 1.2 0.6  PROT 5.8* 6.4* 5.8*  ALBUMIN 2.9* 3.4* 3.1*   Recent Labs    08/30/23 1506 09/04/23 1755  LIPASE 22 35   No results for input(s): AMMONIA in the last 8760 hours. CBC: Recent Labs    09/04/23 1755 09/04/23 2300 09/06/23 0534 09/07/23 0530 09/14/23 1045 09/14/23 1209 09/15/23 0600 09/16/23 0641  WBC 19.8*   < > 13.7* 11.4*   < > 15.7* 6.7 4.7  NEUTROABS 9.3*  --  10.3* 8.5*  --   --   --   --   HGB 10.8*   < > 9.8* 9.6*   < > 12.0 11.4* 10.6*  HCT 34.1*   < > 29.8* 29.7*   < > 36.8 34.9* 31.7*  MCV 90.9   < > 88.4 88.1   < > 89.8 88.4 87.6  PLT 185   < > 183 184   < > 245 225 167   < > = values in this interval not displayed.   Cardiac Enzymes: No results for input(s): CKTOTAL, CKMB, CKMBINDEX, TROPONINI in the last 8760 hours. BNP: Invalid input(s): POCBNP Lab Results  Component Value Date   HGBA1C 6.9 (H) 08/31/2023   Lab Results  Component Value Date   TSH 0.736 09/04/2023   Lab Results  Component Value Date   VITAMINB12 1,867 (H) 04/15/2023   Lab Results  Component Value Date   FOLATE 12.1 07/19/2012   Lab Results  Component Value Date   IRON 72 09/14/2023   FERRITIN 163 (H) 09/14/2023    Imaging and Procedures obtained prior to SNF admission: MR BRAIN WO CONTRAST Result Date: 09/14/2023 CLINICAL DATA:  Syncope/presyncope, cerebrovascular cause suspected EXAM: MRI HEAD WITHOUT CONTRAST TECHNIQUE: Multiplanar, multiecho pulse sequences of the brain and surrounding structures were obtained without intravenous contrast. COMPARISON:  CT head 08/30/2023. FINDINGS: Brain: No acute infarction, hemorrhage, hydrocephalus, extra-axial collection or mass lesion. Remote left cerebellar infarct. Vascular: Normal flow voids. Skull and upper cervical spine: Normal marrow signal. Sinuses/Orbits: Negative. Other: No mastoid effusions. IMPRESSION: 1. No evidence of acute intracranial abnormality. 2. Remote left  cerebellar infarct. Electronically Signed   By: Gilmore GORMAN Molt M.D.   On: 09/14/2023 17:14   DG Chest Port 1 View Result Date: 09/14/2023 EXAM: 1 VIEW XRAY OF THE CHEST 09/14/2023 12:33:12 PM COMPARISON: 09/04/2023 CLINICAL HISTORY: [id number]  SOB (shortness of breath) [id number]. two syncopal episodes while at her doctor's office today. Pt with no reports of hitting her head. Pt has been feeling weak the past week. Pt was was discharged from the hospital a week ago with pnx. Pt with complaints of chronic pain in her back 7/10. ; hypotensive FINDINGS: LUNGS AND PLEURA: Atelectasis versus scar noted in the right lung base. No pulmonary edema. No pleural effusion. No pneumothorax. HEART AND MEDIASTINUM: No acute abnormality of the cardiac and mediastinal silhouettes. BONES AND SOFT TISSUES: No acute osseous abnormality. IMPRESSION: 1. Atelectasis versus scar in the right lung base. Electronically signed by: Waddell Calk MD 09/14/2023 01:08 PM EDT RP Workstation: [id number]    Assessment/Plan  Syncope and presyncope Recent syncope episodes, including one at her doctor's office. Echocardiogram showed EF of 55-60% with grade 2 diastolic dysfunction, no acute abnormalities. EKG unremarkable. No further syncope or confusion during hospitalization. Potential medication-related causes include amitriptyline  and amlodipine . No arrhythmia monitoring history. - Review medications for potential contributors to syncope, particularly amitriptyline  and amlodipine . - Consider heart monitor for arrhythmia evaluation for at least a week.  Type 2 diabetes mellitus with diabetic neuropathy Diabetes managed with metformin . Neuropathy managed with pregabalin  and hydrocodone . Current pregabalin  dose is 150 mg twice daily, considered high. Hydrocodone  not taken regularly. - Consider reducing pregabalin  dose. - Continue metformin  for diabetes management.  Adrenal insufficiency (Addison's disease) Addison's disease managed with  hydrocortisone . Recent hospitalization required stress dosing of steroids, but she is no longer on high doses. - Continue hydrocortisone  as prescribed.  Acute on chronic kidney disease stage 3 Chronic kidney disease stage 3 with recent acute exacerbation.  History of anaphylaxis to antibiotics Recent anaphylaxis following antibiotic administration for a root canal. Currently has an EpiPen  available for emergencies. - Ensure EpiPen  is available for emergency use.  Chronic back pain Chronic back pain for 20 years. Baclofen  prescribed but not taken in the last three months. Hydrocodone  used for neuropathy rather than back pain. - Consider discontinuing baclofen  if not needed.  Depression Depression managed with amitriptyline . Discussed potential side effects in older adults, including dry mouth, constipation, and confusion. Consideration of dose reduction or discontinuation due to side effects and risk of syncope. - Consider reducing or discontinuing amitriptyline .  Unintentional weight loss Reported over 20 pounds of weight loss in the last month. Poor appetite noted. - Encourage increased protein intake to support weight gain and strength.  Diarrhea Recent episodes of diarrhea, with four episodes in one morning.  Goals of Care Discussed resuscitation preferences. Initially did not want chest compressions but changed decision due to family considerations. Discussed risks of anoxic brain injury with resuscitation. She values her role in her daughter's life and this influences her decision to opt for resuscitation. - Document preference for chest compressions in the event of cardiac arrest.  Follow-Up Plan to monitor medication adjustments and their effects over the next week. Discussed potential for short-term rehabilitation stay to regain strength. - Monitor effects of medication adjustments over the next week. - Anticipate short-term rehabilitation stay for approximately 10  days.  Family/ staff Communication: Nursing  Labs/tests ordered: CBC, BMP I spent greater than 50 minutes for the care of this patient in face to face time, chart review, clinical documentation, patient education.

## 2023-09-17 NOTE — Telephone Encounter (Signed)
 Attempted to reach pt to discuss possible 09/04 OV. No answer, no vm.

## 2023-09-19 DIAGNOSIS — E669 Obesity, unspecified: Secondary | ICD-10-CM | POA: Insufficient documentation

## 2023-09-19 DIAGNOSIS — E1169 Type 2 diabetes mellitus with other specified complication: Secondary | ICD-10-CM | POA: Insufficient documentation

## 2023-09-19 LAB — CULTURE, BLOOD (SINGLE)
Culture: NO GROWTH
Culture: NO GROWTH
Culture: NO GROWTH
Special Requests: ADEQUATE
Special Requests: ADEQUATE

## 2023-09-19 NOTE — Patient Instructions (Incomplete)

## 2023-09-23 ENCOUNTER — Ambulatory Visit: Admitting: Physician Assistant

## 2023-09-23 ENCOUNTER — Inpatient Hospital Stay: Admitting: Nurse Practitioner

## 2023-09-23 ENCOUNTER — Encounter: Payer: Self-pay | Admitting: Nurse Practitioner

## 2023-09-23 DIAGNOSIS — E1169 Type 2 diabetes mellitus with other specified complication: Secondary | ICD-10-CM

## 2023-09-23 DIAGNOSIS — N179 Acute kidney failure, unspecified: Secondary | ICD-10-CM

## 2023-09-23 DIAGNOSIS — T50905A Adverse effect of unspecified drugs, medicaments and biological substances, initial encounter: Secondary | ICD-10-CM

## 2023-09-23 DIAGNOSIS — J1569 Pneumonia due to other gram-negative bacteria: Secondary | ICD-10-CM

## 2023-09-23 DIAGNOSIS — I5031 Acute diastolic (congestive) heart failure: Secondary | ICD-10-CM

## 2023-09-23 DIAGNOSIS — R55 Syncope and collapse: Secondary | ICD-10-CM

## 2023-09-23 DIAGNOSIS — E861 Hypovolemia: Secondary | ICD-10-CM

## 2023-09-23 LAB — BASIC METABOLIC PANEL WITH GFR
BUN: 13 (ref 4–21)
CO2: 31 — AB (ref 13–22)
Chloride: 98 — AB (ref 99–108)
Creatinine: 0.9 (ref 0.5–1.1)
Glucose: 179
Potassium: 3.6 meq/L (ref 3.5–5.1)
Sodium: 142 (ref 137–147)

## 2023-09-23 LAB — CBC AND DIFFERENTIAL
HCT: 38 (ref 36–46)
Hemoglobin: 12.2 (ref 12.0–16.0)
Neutrophils Absolute: 3451
Platelets: 219 K/uL (ref 150–400)
WBC: 8.1

## 2023-09-23 LAB — COMPREHENSIVE METABOLIC PANEL WITH GFR
Calcium: 8.6 — AB (ref 8.7–10.7)
eGFR: 72

## 2023-09-23 LAB — CBC: RBC: 4.16 (ref 3.87–5.11)

## 2023-09-24 ENCOUNTER — Non-Acute Institutional Stay (SKILLED_NURSING_FACILITY): Payer: Self-pay | Admitting: Adult Health

## 2023-09-24 ENCOUNTER — Encounter: Payer: Self-pay | Admitting: Adult Health

## 2023-09-24 DIAGNOSIS — F3341 Major depressive disorder, recurrent, in partial remission: Secondary | ICD-10-CM

## 2023-09-24 DIAGNOSIS — I5032 Chronic diastolic (congestive) heart failure: Secondary | ICD-10-CM | POA: Diagnosis not present

## 2023-09-24 DIAGNOSIS — E785 Hyperlipidemia, unspecified: Secondary | ICD-10-CM

## 2023-09-24 DIAGNOSIS — E1159 Type 2 diabetes mellitus with other circulatory complications: Secondary | ICD-10-CM

## 2023-09-24 DIAGNOSIS — I152 Hypertension secondary to endocrine disorders: Secondary | ICD-10-CM

## 2023-09-24 DIAGNOSIS — G6289 Other specified polyneuropathies: Secondary | ICD-10-CM | POA: Diagnosis not present

## 2023-09-24 DIAGNOSIS — F5105 Insomnia due to other mental disorder: Secondary | ICD-10-CM

## 2023-09-24 DIAGNOSIS — E1169 Type 2 diabetes mellitus with other specified complication: Secondary | ICD-10-CM

## 2023-09-24 DIAGNOSIS — F99 Mental disorder, not otherwise specified: Secondary | ICD-10-CM

## 2023-09-24 DIAGNOSIS — E669 Obesity, unspecified: Secondary | ICD-10-CM

## 2023-09-24 DIAGNOSIS — E271 Primary adrenocortical insufficiency: Secondary | ICD-10-CM

## 2023-09-24 NOTE — Progress Notes (Signed)
 Location:  Other [facility]) Nursing Home Room Number: [place] [facility]) Place of Service:  SNF (31) Provider:  Medina-Vargas, Kawon Willcutt, DNP, FNP-BC  Patient Care Team: Cannady, [name] T, NP as PCP - General (Nurse Practitioner) [facility] (Optometry) Danis, Victory LITTIE MOULD, MD as Consulting Physician (Gastroenterology) Phebe Janina Pates, MD as Referring Physician (Endocrinology) Simers, Saralyn Caldron, NP as Nurse Practitioner (Neurology) [name], NP as Nurse Practitioner (Behavioral Health) [name], MD (Dermatology)  Extended Emergency Contact Information Primary Emergency Contact: Sarah [name]  of [place] Home Phone: [phone] Relation: Daughter  Code Status:  Full Code  Goals of care: Advanced Directive information    09/24/2023   11:16 AM  Advanced Directives  Does Patient Have a Medical Advance Directive? No  Would patient like information on creating a medical advance directive? No - Patient declined     Chief Complaint  Patient presents with   Transitions Of Care    for discharge    HPI:  Pt is a 73 y.o. female who is for discharge home tomorrow, 09/25/23. She will have PT and OT through [facility]. She will discharge with her current medications.  She was admitted to [facility] on 09/16/23 post hospitalization 09/14/23 to 09/16/23. She had X2 syncopal episodes at her PCP follow up which was thought to be multifactorial: dehydration and adrenal insufficiency. She improved with IV hydration.  She has completed in-house short-term rehabilitation.   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)    PMS (premenstrual syndrome)    Past Surgical History:  Procedure Laterality Date   BREAST BIOPSY Right    benign   CHOLECYSTECTOMY     COLONOSCOPY  11/08/2018   Danis-polyps   COLONOSCOPY  12/05/2019   Danis   DILATION AND CURETTAGE OF UTERUS     FOOT SURGERY Right 2018   removal of melanoma   LUMBAR FUSION     lymph node removal Right 2018   groin    POLYPECTOMY     TOTAL ABDOMINAL HYSTERECTOMY W/ BILATERAL SALPINGOOPHORECTOMY     TUBAL LIGATION      Allergies  Allergen Reactions   Clindamycin/Lincomycin Anaphylaxis   Doxycycline  Rash   Aspirin  Other (See Comments)    Pt is unsure of reaction, but states that she is allergic to aspirin    Ciprofloxacin Rash    very bad rash   Penicillins Rash    Has patient had a PCN reaction causing immediate rash, facial/tongue/throat swelling, SOB or lightheadedness with hypotension: No Has patient had a PCN reaction causing severe rash involving mucus membranes or skin necrosis: No Has patient had a PCN reaction that required hospitalization: No Has patient had a PCN reaction occurring within the last 10 years: No If all of the above answers are NO, then may proceed  with Cephalosporin use.     Outpatient Encounter Medications as of 09/24/2023  Medication Sig   amitriptyline  (ELAVIL ) 50 MG tablet Take 1 tablet (50 mg total) by mouth at bedtime.   amLODipine  (NORVASC ) 2.5 MG tablet Take 1 tablet (2.5 mg total) by mouth daily.   Blood Glucose Monitoring Suppl (ONETOUCH VERIO) w/Device KIT Use to check blood sugar 3 times a day and document results, bring to appointments.  Goal is <130 fasting blood sugar and <180 two hours after meals.   Cholecalciferol (VITAMIN D ) 50 MCG (2000 UT) CAPS Take 1 capsule by mouth at  bedtime.   Cyanocobalamin (VITAMIN B-12 PO) Take 1 tablet by mouth at bedtime.   diphenhydrAMINE  (BENADRYL ) 25 mg capsule Take 1 capsule (25 mg total) by mouth every 6 (six) hours as needed for itching.   EPINEPHrine  (EPIPEN  2-PAK) 0.3 mg/0.3 mL IJ SOAJ injection Inject 0.3 mg into the muscle as needed for anaphylaxis.   FIBER ADULT GUMMIES PO Take 1 tablet by mouth daily as needed.   furosemide  (LASIX ) 40 MG tablet TAKE 1 TABLET BY MOUTH EVERY DAY   hydrocortisone  (CORTEF ) 5 MG tablet TAKE THREE TABLETS IN THE MORNING AND ONE TABLET IN THE AFTERNOON (Patient taking differently: Take 5 mg by mouth daily.)   metFORMIN  (GLUCOPHAGE ) 1000 MG tablet Take 1 tablet (1,000 mg total) by mouth 2 (two) times daily with a meal.   OneTouch UltraSoft 2 Lancets MISC USE TO CHECK BLOOD SUGAR 3 TIMES A DAY AND DOCUMENT RESULTS, BRING TO APPOINTMENTS. GOAL IS <130 FASTING BLOOD SUGAR AND <180 TWO HOURS AFTER MEALS.   pregabalin  (LYRICA ) 150 MG capsule Take 1 capsule (150 mg total) by mouth 2 (two) times daily. 150 mg in am, 150 MG in afternoon, and 300 mg in pm   rosuvastatin  (CRESTOR ) 10 MG tablet Take 1 tablet (10 mg total) by mouth daily.   Suvorexant  (BELSOMRA ) 15 MG TABS Take 1 tablet (15 mg total) by mouth at bedtime as needed.   Baclofen  5 MG TABS TAKE 1 TABLET BY MOUTH EVERY 8 HOURS AS NEEDED (Patient not taking: Reported on 09/24/2023)   meclizine  (ANTIVERT ) 25 MG tablet Take 1 tablet (25 mg total) by mouth 3 (three) times daily as needed for dizziness. (Patient not taking: Reported on 09/24/2023)   No facility-administered encounter medications on file as of 09/24/2023.    Review of Systems  Constitutional:  Negative for appetite change, chills, fatigue and fever.  HENT:  Negative for congestion, hearing loss, rhinorrhea and sore throat.   Eyes: Negative.   Respiratory:  Negative for cough, shortness of breath and wheezing.   Cardiovascular:  Negative for chest pain, palpitations and leg swelling.   Gastrointestinal:  Negative for abdominal pain, constipation, diarrhea, nausea and vomiting.  Genitourinary:  Negative for dysuria.  Musculoskeletal:  Negative for arthralgias, back pain and myalgias.  Skin:  Negative for color change, rash and wound.  Neurological:  Negative for dizziness, weakness and headaches.  Psychiatric/Behavioral:  Negative for behavioral problems. The patient is not nervous/anxious.        Immunization History  Administered Date(s) Administered   Fluad Quad(high Dose 65+) 02/04/2021, 11/10/2021   Fluad Trivalent(High Dose 65+) 09/23/2022   Influenza Split 12/19/2010, 12/25/2011   Influenza,inj,Quad PF,6+ Mos 09/20/2014, 12/02/2015, 01/16/2019   Influenza-Unspecified 08/19/2017   PFIZER(Purple Top)SARS-COV-2 Vaccination 04/14/2019, 05/09/2019, 12/23/2019, 01/02/2020   Pneumococcal Conjugate-13 05/24/2015   Pneumococcal Polysaccharide-23 07/19/2009, 02/04/2021   Tdap 05/24/1992, 02/16/2013   Zoster, Live 02/28/2013   Pertinent  Health Maintenance Due  Topic Date Due   OPHTHALMOLOGY EXAM  04/19/2021   Influenza Vaccine  08/20/2023   HEMOGLOBIN A1C  03/02/2024   Colonoscopy  03/19/2024   FOOT EXAM  09/13/2024   MAMMOGRAM  10/11/2024   DEXA SCAN  10/03/2026      06/17/2022    2:44 PM 09/23/2022   10:40 AM 09/29/2022    3:52 PM 12/30/2022    1:20 PM 09/14/2023   10:08 AM  Fall Risk  Falls in the past year? 0 0 1 0 1  Was there an injury with Fall? 0 0 0 0 0  Fall Risk Category Calculator 0 0 1 0 2  Patient at Risk for Falls Due to No Fall Risks No Fall Risks History of fall(s) No Fall Risks History of fall(s)  Fall risk Follow up Falls evaluation completed Falls evaluation completed Falls prevention discussed;Falls evaluation completed;Education provided Falls evaluation completed Falls evaluation completed     Vitals:   09/24/23 1122  BP: 131/77  Pulse: 78  Resp: 18  Temp: 98.2 F (36.8 C)  SpO2: 96%  Weight: 232 lb 6.4 oz (105.4 kg)   Height: 5' 4 (1.626 m)   Body mass index is 39.89 kg/m.  Physical Exam Constitutional:      General: She is not in acute distress.    Appearance: She is obese.  HENT:     Head: Normocephalic and atraumatic.     Nose: Nose normal.     Mouth/Throat:     Mouth: Mucous membranes are moist.  Eyes:     Conjunctiva/sclera: Conjunctivae normal.  Cardiovascular:     Rate and Rhythm: Normal rate and regular rhythm.  Pulmonary:     Effort: Pulmonary effort is normal.     Breath sounds: Normal breath sounds.  Abdominal:     General: Bowel sounds are normal.     Palpations: Abdomen is soft.  Musculoskeletal:        General: Normal range of motion.     Cervical back: Normal range of motion.  Skin:    General: Skin is warm and dry.  Neurological:     General: No focal deficit present.     Mental Status: She is alert and oriented to person, place, and time.  Psychiatric:        Mood and Affect: Mood normal.        Behavior: Behavior normal.        Thought Content: Thought content normal.        Judgment: Judgment normal.        Labs reviewed: Recent Labs    08/31/23 0514 09/01/23 0350 09/02/23 0316 09/04/23 1755 09/14/23 1209 09/15/23 0600 09/16/23 0641 09/23/23 0000  NA 135 137 141   < > 143 139 141 142  K 4.5 4.3 4.2   < > 3.7 3.6 3.6 3.6  CL 105 104 105   < > 98 99 102 98*  CO2 20* 24 26   < > 30 28 26  31*  GLUCOSE 244* 161* 143*   < > 155* 170* 199*  --   BUN 30* 27* 28*   < > 20 18 17 13   CREATININE 1.65* 1.18* 1.00   < > 1.77* 1.20* 0.93 0.9  CALCIUM  8.0* 8.2* 8.3*   < > 9.1 8.4* 8.7* 8.6*  MG 1.7 2.1 2.2  --  1.6*  --   --   --   PHOS 3.1 2.5 2.3*  --   --   --   --   --    < > =  values in this interval not displayed.   Recent Labs    09/14/23 1209 09/15/23 0600 09/16/23 0641  AST 18 11* 12*  ALT 15 17 14   ALKPHOS 48 54 49  BILITOT 1.1 1.2 0.6  PROT 5.8* 6.4* 5.8*  ALBUMIN 2.9* 3.4* 3.1*   Recent Labs    09/06/23 0534 09/07/23 0530  09/14/23 1045 09/14/23 1209 09/15/23 0600 09/16/23 0641 09/23/23 0000  WBC 13.7* 11.4*   < > 15.7* 6.7 4.7 8.1  NEUTROABS 10.3* 8.5*  --   --   --   --  3,451.00  HGB 9.8* 9.6*   < > 12.0 11.4* 10.6* 12.2  HCT 29.8* 29.7*   < > 36.8 34.9* 31.7* 38  MCV 88.4 88.1   < > 89.8 88.4 87.6  --   PLT 183 184   < > 245 225 167 219   < > = values in this interval not displayed.   Lab Results  Component Value Date   TSH 0.736 09/04/2023   Lab Results  Component Value Date   HGBA1C 6.9 (H) 08/31/2023   Lab Results  Component Value Date   CHOL 140 04/15/2023   HDL 58 04/15/2023   LDLCALC 62 04/15/2023   TRIG 79 09/04/2023   CHOLHDL 4.1 04/23/2020    Significant Diagnostic Results in last 30 days:  MR BRAIN WO CONTRAST Result Date: 09/14/2023 CLINICAL DATA:  Syncope/presyncope, cerebrovascular cause suspected EXAM: MRI HEAD WITHOUT CONTRAST TECHNIQUE: Multiplanar, multiecho pulse sequences of the brain and surrounding structures were obtained without intravenous contrast. COMPARISON:  CT head 08/30/2023. FINDINGS: Brain: No acute infarction, hemorrhage, hydrocephalus, extra-axial collection or mass lesion. Remote left cerebellar infarct. Vascular: Normal flow voids. Skull and upper cervical spine: Normal marrow signal. Sinuses/Orbits: Negative. Other: No mastoid effusions. IMPRESSION: 1. No evidence of acute intracranial abnormality. 2. Remote left cerebellar infarct. Electronically Signed   By: Gilmore GORMAN Molt M.D.   On: 09/14/2023 17:14   DG Chest Port 1 View Result Date: 09/14/2023 EXAM: 1 VIEW XRAY OF THE CHEST 09/14/2023 12:33:12 PM COMPARISON: 09/04/2023 CLINICAL HISTORY: 141880 SOB (shortness of breath) 141880. two syncopal episodes while at her doctor's office today. Pt with no reports of hitting her head. Pt has been feeling weak the past week. Pt was was discharged from the hospital a week ago with pnx. Pt with complaints of chronic pain in her back 7/10. ; hypotensive FINDINGS:  LUNGS AND PLEURA: Atelectasis versus scar noted in the right lung base. No pulmonary edema. No pleural effusion. No pneumothorax. HEART AND MEDIASTINUM: No acute abnormality of the cardiac and mediastinal silhouettes. BONES AND SOFT TISSUES: No acute osseous abnormality. IMPRESSION: 1. Atelectasis versus scar in the right lung base. Electronically signed by: Waddell Calk MD 09/14/2023 01:08 PM EDT RP Workstation: [id number]   ECHOCARDIOGRAM COMPLETE Result Date: 09/05/2023    ECHOCARDIOGRAM REPORT   Patient Name:   Sarah [name] Date of Exam: 09/05/2023 Medical Rec #:  [id number]    Height:       64.0 in Accession #:    [id number]   Weight:       232.0 lb Date of Birth:  [birth year]    BSA:          2.084 m Patient Age:    73 years     BP:           91/47 mmHg Patient Gender: F            HR:  67 bpm. Exam Location:  [facility] Procedure: 2D Echo, Cardiac Doppler and Color Doppler (Both Spectral and Color            Flow Doppler were utilized during procedure). Indications:     CHF- Acute Systolic I50.21  History:         Patient has no prior history of Echocardiogram examinations.  Sonographer:     Bernice Rubinstein RDCS Referring Phys:  [id number] PRENTICE BROCKS CORE Diagnosing Phys: Cara JONETTA Lovelace MD  Sonographer Comments: Suboptimal subcostal window and patient is obese. Image acquisition challenging due to respiratory motion. IMPRESSIONS  1. Left ventricular ejection fraction, by estimation, is 55 to 60%. The left ventricle has normal function. The left ventricle has no regional wall motion abnormalities. Left ventricular diastolic parameters are consistent with Grade II diastolic dysfunction (pseudonormalization).  2. Right ventricular systolic function is normal. The right ventricular size is normal.  3. The mitral valve is normal in structure. No evidence of mitral valve regurgitation.  4. The aortic valve is normal in structure. Aortic valve regurgitation is not visualized. FINDINGS  Left Ventricle: Left ventricular  ejection fraction, by estimation, is 55 to 60%. The left ventricle has normal function. The left ventricle has no regional wall motion abnormalities. Strain was performed and the global longitudinal strain is indeterminate. The left ventricular internal cavity size was normal in size. There is no left ventricular hypertrophy. Left ventricular diastolic parameters are consistent with Grade II diastolic dysfunction (pseudonormalization). Right Ventricle: The right ventricular size is normal. No increase in right ventricular wall thickness. Right ventricular systolic function is normal. Left Atrium: Left atrial size was normal in size. Right Atrium: Right atrial size was normal in size. Pericardium: There is no evidence of pericardial effusion. Mitral Valve: The mitral valve is normal in structure. No evidence of mitral valve regurgitation. Tricuspid Valve: The tricuspid valve is normal in structure. Tricuspid valve regurgitation is trivial. Aortic Valve: The aortic valve is normal in structure. Aortic valve regurgitation is not visualized. Aortic valve peak gradient measures 8.2 mmHg. Pulmonic Valve: The pulmonic valve was normal in structure. Pulmonic valve regurgitation is not visualized. Aorta: The ascending aorta was not well visualized. IAS/Shunts: No atrial level shunt detected by color flow Doppler. Additional Comments: 3D was performed not requiring image post processing on an independent workstation and was indeterminate.  LEFT VENTRICLE PLAX 2D LVIDd:         5.40 cm   Diastology LVIDs:         3.70 cm   LV e' medial:    9.03 cm/s LV PW:         1.00 cm   LV E/e' medial:  14.0 LV IVS:        1.00 cm   LV e' lateral:   12.40 cm/s LVOT diam:     2.00 cm   LV E/e' lateral: 10.2 LV SV:         91 LV SV Index:   44 LVOT Area:     3.14 cm  RIGHT VENTRICLE RV Basal diam:  2.60 cm RV S prime:     9.32 cm/s TAPSE (M-mode): 2.1 cm LEFT ATRIUM             Index        RIGHT ATRIUM           Index LA diam:        4.10  cm 1.97 cm/m   RA Area:     13.40 cm  LA Vol (A2C):   61.2 ml 29.37 ml/m  RA Volume:   34.10 ml  16.36 ml/m LA Vol (A4C):   35.6 ml 17.08 ml/m LA Biplane Vol: 47.7 ml 22.89 ml/m  AORTIC VALVE                 PULMONIC VALVE AV Area (Vmax): 2.55 cm     PV Vmax:        0.83 m/s AV Vmax:        143.00 cm/s  PV Peak grad:   2.8 mmHg AV Peak Grad:   8.2 mmHg     RVOT Peak grad: 1 mmHg LVOT Vmax:      116.00 cm/s LVOT Vmean:     80.700 cm/s LVOT VTI:       0.289 m  AORTA Ao Root diam: 2.80 cm Ao Asc diam:  3.10 cm MITRAL VALVE MV Area (PHT): 4.86 cm     SHUNTS MV Decel Time: 156 msec     Systemic VTI:  0.29 m MV E velocity: 126.00 cm/s  Systemic Diam: 2.00 cm MV A velocity: 77.80 cm/s MV E/A ratio:  1.62 Cara JONETTA Lovelace MD Electronically signed by Cara JONETTA Lovelace MD Signature Date/Time: 09/05/2023/2:52:20 PM    Final    US  Abdomen Complete Result Date: 09/04/2023 CLINICAL DATA:  Splenomegaly EXAM: ABDOMEN ULTRASOUND COMPLETE COMPARISON:  CT 08/30/2023 FINDINGS: Gallbladder: Prior cholecystectomy. Common bile duct: Diameter: Common bile duct not visualized, normal caliber on recent CT. Liver: Hepatomegaly. Diffusely increased echotexture compatible with fatty infiltration. No focal abnormality. Portal vein is patent on color Doppler imaging with normal direction of blood flow towards the liver. IVC: No abnormality visualized. Pancreas: Visualized portion unremarkable. Spleen: Normal size with a craniocaudal length of 11.8 cm. No focal abnormality. Right Kidney: Length: 11.1 cm. Echogenicity within normal limits. No mass or hydronephrosis visualized. Left Kidney: Length: 11.8 cm. Echogenicity within normal limits. No mass or hydronephrosis visualized. Abdominal aorta: No aneurysm visualized. Other findings: None. IMPRESSION: Hepatomegaly. Diffusely increased echotexture compatible with fatty infiltration. No evidence of splenomegaly. Electronically Signed   By: Franky Crease M.D.   On: 09/04/2023 23:37   CT  Angio Chest PE W and/or Wo Contrast Result Date: 09/04/2023 CLINICAL DATA:  Hypoxic EXAM: CT ANGIOGRAPHY CHEST WITH CONTRAST TECHNIQUE: Multidetector CT imaging of the chest was performed using the standard protocol during bolus administration of intravenous contrast. Multiplanar CT image reconstructions and MIPs were obtained to evaluate the vascular anatomy. RADIATION DOSE REDUCTION: This exam was performed according to the departmental dose-optimization program which includes automated exposure control, adjustment of the mA and/or kV according to patient size and/or use of iterative reconstruction technique. CONTRAST:  OMNIPAQUE  IOHEXOL  350 MG/ML SOLN COMPARISON:  Chest x-ray 09/04/2023, CT 08/30/2023 FINDINGS: Cardiovascular: Satisfactory opacification of the pulmonary arteries to the segmental level. No evidence of pulmonary embolism. Mild aortic atherosclerosis. No aneurysm or dissection. Coronary vascular calcification. Mild cardiomegaly. No pericardial effusion Mediastinum/Nodes: Patent trachea. No thyroid  mass. Mildly enlarged right paratracheal node measuring 10 mm, slightly increased compared to prior. Esophagus within normal limits. Lungs/Pleura: Interval trace bilateral effusions. Dependent atelectasis in the lower lobes. Mild diffuse bilateral hazy pulmonary density could be due to mild edema. More patchy and organized airspace disease in the lower lobes could reflect edema or pneumonia. Punctate pulmonary nodules, largest is seen in the left upper lobe and measures 3 mm on series 5, image 68. Upper Abdomen: No acute finding.  Hepatic steatosis Musculoskeletal: No acute or suspicious osseous  abnormality Review of the MIP images confirms the above findings. IMPRESSION: 1. Negative for acute pulmonary embolus. 2. Interval trace bilateral effusions with dependent atelectasis in the lower lobes. Mild diffuse bilateral hazy pulmonary density could be due to mild edema. More patchy and organized  airspace disease in the lower lobes could reflect edema or pneumonia. 3. Mildly enlarged right paratracheal node, slightly increased compared to prior, likely reactive. 4. Punctate pulmonary nodules measuring up to 3 mm. No follow-up needed if patient is low-risk (and has no known or suspected primary neoplasm). Non-contrast chest CT can be considered in 12 months if patient is high-risk. This recommendation follows the consensus statement: Guidelines for Management of Incidental Pulmonary Nodules Detected on CT Images: From the Fleischner Society 2017; Radiology 2017; 284:228-243. 5. Aortic atherosclerosis. Aortic Atherosclerosis (ICD10-I70.0). Electronically Signed   By: Luke Bun M.D.   On: 09/04/2023 20:16   DG Chest Portable 1 View Result Date: 09/04/2023 CLINICAL DATA:  Hypoxia EXAM: PORTABLE CHEST 1 VIEW COMPARISON:  August 30, 2023 FINDINGS: Patchy opacities at the bilateral lung bases, right greater than left. No definite pleural effusions. No pneumothorax. Unchanged cardiomediastinal silhouette. No acute osseous findings. IMPRESSION: Patchy opacities at the bilateral lung bases, may represent infection and/or atelectasis. Electronically Signed   By: Michaeline Blanch M.D.   On: 09/04/2023 18:05   CT CHEST ABDOMEN PELVIS WO CONTRAST Result Date: 08/30/2023 CLINICAL DATA:  Rash, shortness of breath, hypotension. EXAM: CT CHEST, ABDOMEN AND PELVIS WITHOUT CONTRAST TECHNIQUE: Multidetector CT imaging of the chest, abdomen and pelvis was performed following the standard protocol without IV contrast. RADIATION DOSE REDUCTION: This exam was performed according to the departmental dose-optimization program which includes automated exposure control, adjustment of the mA and/or kV according to patient size and/or use of iterative reconstruction technique. COMPARISON:  CT abdomen pelvis 09/04/2017. FINDINGS: CT CHEST FINDINGS Cardiovascular: Atherosclerotic calcification of the aorta, aortic valve and coronary  arteries. Heart is enlarged. No pericardial effusion. Mediastinum/Nodes: No pathologically enlarged mediastinal or axillary lymph nodes. Hilar regions are difficult to definitively evaluate without IV contrast. Multiple subpectoral lymph nodes are not considered enlarged by CT size criteria but are noted. Esophagus is grossly unremarkable. Lungs/Pleura: Image quality is degraded by expiratory phase imaging and respiratory motion. 2 mm apical segment right upper lobe nodule (4/34) and 3 mm lingular nodule (4/68). Minimal dependent atelectasis in the right lower lobe. No pleural fluid. Airway is otherwise unremarkable. Musculoskeletal: Degenerative changes in the spine. CT ABDOMEN PELVIS FINDINGS Hepatobiliary: Liver is decreased in attenuation diffusely and is enlarged, 20.4 cm. Cholecystectomy. No biliary ductal dilatation. Pancreas: Negative. Spleen: Negative. Adrenals/Urinary Tract: Adrenal glands and kidneys are unremarkable. Ureters are decompressed. Bladder is grossly unremarkable. Stomach/Bowel: Tiny hiatal hernia. Stomach, small bowel, appendix and colon are unremarkable. Vascular/Lymphatic: Atherosclerotic calcification of the aorta. No pathologically enlarged lymph nodes. Small left inguinal lymph nodes are unchanged. Surgical clips in the right groin. Reproductive: Hysterectomy.  No adnexal mass. Other: No free fluid. Musculoskeletal: Degenerative changes in the spine. L4-S1 posterior lumbar interbody fusion. IMPRESSION: 1. No acute findings. 2. Steatotic enlarged liver. 3. Tiny upper lobe pulmonary nodules. No follow-up needed if patient is low-risk (and has no known or suspected primary neoplasm). Non-contrast chest CT can be considered in 12 months if patient is high-risk. This recommendation follows the consensus statement: Guidelines for Management of Incidental Pulmonary Nodules Detected on CT Images: From the Fleischner Society 2017; Radiology 2017; 284:228-243. 4. Aortic atherosclerosis  (ICD10-I70.0). Coronary artery calcification. Electronically Signed   By:  Newell Eke M.D.   On: 08/30/2023 15:39   CT Head Wo Contrast Result Date: 08/30/2023 CLINICAL DATA:  Hypotensive with rash and shortness of breath. EXAM: CT HEAD WITHOUT CONTRAST TECHNIQUE: Contiguous axial images were obtained from the base of the skull through the vertex without intravenous contrast. RADIATION DOSE REDUCTION: This exam was performed according to the departmental dose-optimization program which includes automated exposure control, adjustment of the mA and/or kV according to patient size and/or use of iterative reconstruction technique. COMPARISON:  September 03, 2017 FINDINGS: Brain: There is generalized cerebral atrophy with widening of the extra-axial spaces and ventricular dilatation. There are areas of decreased attenuation within the white matter tracts of the supratentorial brain, consistent with microvascular disease changes. A chronic left cerebellar infarct is noted. Vascular: No hyperdense vessel or unexpected calcification. Skull: Normal. Negative for fracture or focal lesion. Sinuses/Orbits: No acute finding. Other: None. IMPRESSION: 1. Generalized cerebral atrophy with chronic white matter small vessel ischemic changes. 2. Chronic left cerebellar infarct. 3. No acute intracranial abnormality. Electronically Signed   By: [name] Dials M.D.   On: 08/30/2023 15:36   DG Chest Portable 1 View Result Date: 08/30/2023 CLINICAL DATA:  Sepsis EXAM: PORTABLE CHEST - 1 VIEW COMPARISON:  October 04, 2017 FINDINGS: Low lung volumes. Patient is rotated towards the right. No focal airspace consolidation, pleural effusion, or pneumothorax. The cardiac silhouette is at the upper limits of normal, likely accentuated by AP technique and low lung volumes. Tortuous aorta with aortic atherosclerosis. No acute fracture or destructive lesions. Multilevel thoracic osteophytosis. IMPRESSION: No acute cardiopulmonary  abnormality. Electronically Signed   By: Rogelia Myers M.D.   On: 08/30/2023 13:39    Assessment/Plan  1. Hypertension associated with diabetes (HCC) (Primary) -  BP 131/77 -  continue Amlodipine  2.5 mg daily  2. Type 2 diabetes mellitus with obesity (HCC) Lab Results  Component Value Date   HGBA1C 6.9 (H) 08/31/2023    -  continue Metformin  1,000 mg BID  3. Chronic heart failure with preserved ejection fraction (HCC) -  2D echo EF 55-60% -  continue Furosemide  40 mg daily  4. Axonal sensorimotor neuropathy -  continue Pregabalin  150 mg BID  5. Hyperlipidemia associated with type 2 diabetes mellitus (HCC) Lab Results  Component Value Date   CHOL 140 04/15/2023   HDL 58 04/15/2023   LDLCALC 62 04/15/2023   TRIG 79 09/04/2023   CHOLHDL 4.1 04/23/2020    -  continue Rosuvastatin  10 mg daily  6. Insomnia due to other mental disorder -  continue Belsomra  20 mg at bedtime  7. 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HCC) -  continue Amitriptyline  50 mg at bedtime  8. Adrenal Insufficiency (Addison's Disease) HCC -  continue Hydrocortisone  5 mg take 3 tabs in the morning and 1 tab in the afternoon      Total discharge time: Greater than 30 minutes  Discharge time involved coordination of the discharge process with Child psychotherapist and nursing staff.    Javonne Dorko Medina-Vargas, DNP, MSN, FNP-BC [facility] [phone] (Monday-Friday 8:00 a.m. - 5:00 p.m.) [phone] (after hours)

## 2023-09-25 NOTE — Patient Instructions (Incomplete)
 Be Involved in Caring For Your Health:  Taking Medications When medications are taken as directed, they can greatly improve your health. But if they are not taken as prescribed, they may not work. In some cases, not taking them correctly can be harmful. To help ensure your treatment remains effective and safe, understand your medications and how to take them. Bring your medications to each visit for review by your provider.  Your lab results, notes, and after visit summary will be available on My Chart. We strongly encourage you to use this feature. If lab results are abnormal the clinic will contact you with the appropriate steps. If the clinic does not contact you assume the results are satisfactory. You can always view your results on My Chart. If you have questions regarding your health or results, please contact the clinic during office hours. You can also ask questions on My Chart.  We at [facility] are grateful that you chose [place] to provide your care. We strive to provide evidence-based and compassionate care and are always looking for feedback. If you get a survey from the clinic please complete this so we can hear your opinions.  Community-Acquired Pneumonia, Adult Pneumonia is an infection of the lungs. It causes irritation and swelling in the airways of the lungs. Mucus and fluid may also build up inside the airways. This may cause coughing and trouble breathing. One type of pneumonia can happen while you are in a hospital. A different type can happen when you are not in a hospital (community-acquired pneumonia). What are the causes?  This condition is caused by germs (viruses, bacteria, or fungi). Some types of germs can spread from person to person. Pneumonia is not thought to spread from person to person. What increases the risk? You have a long-term (chronic) disease, such as: Disease of the lungs. This may be chronic obstructive pulmonary disease (COPD) or asthma. Heart  failure. Cystic fibrosis. Diabetes. Kidney disease. Sickle cell disease. HIV. You have other health problems, such as: Your body's defense system (immune system) is weak. A condition that may cause you to breathe in fluids from your mouth and nose. You had your spleen taken out. You do not take good care of your teeth and mouth (poor dental hygiene). You use or have used tobacco products. You go where the germs that cause this illness are common. You are older than 72 years of age. What are the signs or symptoms? A cough. A fever. Sweating or chills. Chest pain, often when you breathe deeply or cough. Breathing problems, such as: Fast breathing. Trouble breathing. Shortness of breath. Feeling tired (fatigued). Muscle aches. How is this treated? Treatment for this condition depends on many things, such as: The cause of your illness. Your medicines. Your other health problems. Most adults can be treated at home. Sometimes, treatment must happen in a hospital. Treatment may include medicines to kill germs. Medicines may depend on which germ caused your illness. Very bad pneumonia is rare. If you get it, you may: Have a machine to help you breathe. Have fluid taken away from around your lungs. Follow these instructions at home: Medicines Take over-the-counter and prescription medicines only as told by your doctor. Take cough medicine only if you are losing sleep. Cough medicine can keep your body from taking mucus away from your lungs. If you were prescribed antibiotics, take them as told by your doctor. Do not stop taking them even if you start to feel better. Lifestyle  Do not smoke or use any products that contain nicotine or tobacco. If you need help quitting, ask your doctor. Do not drink alcohol. Eat a healthy diet. This includes a lot of vegetables, fruits, whole grains, low-fat dairy products, and low-fat (lean) protein. General instructions  Rest a lot. Sleep  for at least 8 hours each night. Sleep with your head and neck raised. Put a few pillows under your head or sleep in a reclining chair. Return to your normal activities as told by your doctor. Ask your doctor what activities are safe for you. Drink enough fluid to keep your pee (urine) pale yellow. If your throat is sore, gargle with a mixture of salt and water 3-4 times a day or as needed. To make salt water, completely dissolve -1 tsp (3-6 g) of salt in 1 cup (237 mL) of warm water. Keep all follow-up visits. How is this prevented? Getting the pneumonia shot (vaccine). These shots have different types and schedules. Ask your doctor what works best for you. Think about getting this shot if: You are older than 72 years of age. You are 25-63 years of age and: You are being treated for cancer. You have long-term lung disease. You have other problems that affect your body's defense system. Ask your doctor if you have one of these. Getting your flu shot every year. Ask your doctor which type of shot is best for you. Going to the dentist as often as told. Washing your hands often with soap and water for at least 20 seconds. If you cannot use soap and water, use hand sanitizer. Contact a doctor if: You have a fever. You lose sleep because your cough medicine does not help. Get help right away if: You are short of breath and this gets worse. You have more chest pain. Your sickness gets worse. This is very serious if: You are an older adult. Your body's defense system is weak. You cough up blood. These symptoms may be an emergency. Get help right away. Call 911. Do not wait to see if the symptoms will go away. Do not drive yourself to the hospital. Summary Pneumonia is an infection of the lungs. Community-acquired pneumonia affects people who have not been in the hospital. Certain germs can cause this infection. This condition may be treated with medicines that kill germs. For very bad  pneumonia, you may need a hospital stay and treatment to help with breathing. This information is not intended to replace advice given to you by your health care provider. Make sure you discuss any questions you have with your health care provider. Document Revised: 03/05/2021 Document Reviewed: 03/05/2021 Elsevier Patient Education  2024 [employer].

## 2023-09-27 ENCOUNTER — Inpatient Hospital Stay: Admitting: Nurse Practitioner

## 2023-09-27 DIAGNOSIS — J1569 Pneumonia due to other gram-negative bacteria: Secondary | ICD-10-CM

## 2023-09-27 DIAGNOSIS — E1169 Type 2 diabetes mellitus with other specified complication: Secondary | ICD-10-CM

## 2023-09-27 DIAGNOSIS — T50905A Adverse effect of unspecified drugs, medicaments and biological substances, initial encounter: Secondary | ICD-10-CM

## 2023-09-27 DIAGNOSIS — N189 Chronic kidney disease, unspecified: Secondary | ICD-10-CM

## 2023-09-27 DIAGNOSIS — I5032 Chronic diastolic (congestive) heart failure: Secondary | ICD-10-CM

## 2023-09-27 DIAGNOSIS — E66812 Obesity, class 2: Secondary | ICD-10-CM

## 2023-10-10 NOTE — Patient Instructions (Signed)

## 2023-10-13 ENCOUNTER — Encounter: Payer: Self-pay | Admitting: Nurse Practitioner

## 2023-10-13 ENCOUNTER — Ambulatory Visit (INDEPENDENT_AMBULATORY_CARE_PROVIDER_SITE_OTHER): Admitting: Nurse Practitioner

## 2023-10-13 VITALS — BP 116/68 | HR 73 | Temp 98.9°F | Resp 15 | Ht 64.02 in | Wt 235.8 lb

## 2023-10-13 DIAGNOSIS — T50905A Adverse effect of unspecified drugs, medicaments and biological substances, initial encounter: Secondary | ICD-10-CM

## 2023-10-13 DIAGNOSIS — D7212 Drug rash with eosinophilia and systemic symptoms syndrome: Secondary | ICD-10-CM

## 2023-10-13 DIAGNOSIS — Z8673 Personal history of transient ischemic attack (TIA), and cerebral infarction without residual deficits: Secondary | ICD-10-CM

## 2023-10-13 DIAGNOSIS — F3341 Major depressive disorder, recurrent, in partial remission: Secondary | ICD-10-CM

## 2023-10-13 DIAGNOSIS — N179 Acute kidney failure, unspecified: Secondary | ICD-10-CM | POA: Diagnosis not present

## 2023-10-13 DIAGNOSIS — E785 Hyperlipidemia, unspecified: Secondary | ICD-10-CM

## 2023-10-13 DIAGNOSIS — J1569 Pneumonia due to other gram-negative bacteria: Secondary | ICD-10-CM | POA: Diagnosis not present

## 2023-10-13 DIAGNOSIS — R42 Dizziness and giddiness: Secondary | ICD-10-CM | POA: Insufficient documentation

## 2023-10-13 DIAGNOSIS — E669 Obesity, unspecified: Secondary | ICD-10-CM

## 2023-10-13 DIAGNOSIS — N189 Chronic kidney disease, unspecified: Secondary | ICD-10-CM

## 2023-10-13 DIAGNOSIS — E1169 Type 2 diabetes mellitus with other specified complication: Secondary | ICD-10-CM

## 2023-10-13 DIAGNOSIS — Z23 Encounter for immunization: Secondary | ICD-10-CM

## 2023-10-13 DIAGNOSIS — Z7984 Long term (current) use of oral hypoglycemic drugs: Secondary | ICD-10-CM

## 2023-10-13 LAB — MICROALBUMIN, URINE WAIVED
Creatinine, Urine Waived: 200 mg/dL (ref 10–300)
Microalb, Ur Waived: 80 mg/L — ABNORMAL HIGH (ref 0–19)

## 2023-10-13 MED ORDER — MECLIZINE HCL 25 MG PO TABS: 25.0000 mg | ORAL_TABLET | Freq: Three times a day (TID) | ORAL | 2 refills | Status: DC | PRN

## 2023-10-13 MED ORDER — PANTOPRAZOLE SODIUM 20 MG PO TBEC: 20.0000 mg | DELAYED_RELEASE_TABLET | Freq: Every day | ORAL | 1 refills | Status: DC

## 2023-10-13 NOTE — Assessment & Plan Note (Addendum)
 Ongoing issues for weeks now -- has some cardiac symptoms (tachycardia episodes and SOB) and some ENT symptoms (tinnitus) with this.  Will order Zio monitor to further assess her tachycardia episodes.  Referral to cardiology to further assess due to risk factors for cardiac event and recent symptoms.  Referral to ENT to further assess tinnitus with the dizziness.  Determine next steps after all results and notes returned.  Refills on Meclizine  sent in. Reassuring neuro exam today.

## 2023-10-13 NOTE — Assessment & Plan Note (Signed)
 Due to recent allergic reaction to Clindamycin for dental abscess.  Overall rash improved.  Recommend continue OTC Sarna lotion. Would avoid Benadryl  due to age, can take Claritin or Zyrtec daily if needed.

## 2023-10-13 NOTE — Assessment & Plan Note (Signed)
 Acute and improving prior to discharge.  Recheck labs today and ensure good hydration at home.

## 2023-10-13 NOTE — Assessment & Plan Note (Signed)
 Chronic, ongoing with A1c 6.9% on recent hospital check, urine ALB 80 September 2025  Taking Metformin  1000 MG BID and tolerating.  Recommend check BS fasting every morning and document for visits.  Recommend heavy focus on diabetic diet at home and regular exercise. - Eye exam up to date. Will get records. - Foot exam up to date - ARB and Statin on board - Pneumococcal and Flu vaccines up to date.

## 2023-10-13 NOTE — Assessment & Plan Note (Addendum)
 Acute and improving.  Lung sounds reassuring today on exam. Plan on repeat imaging around 10/26/23 or order at next visit.

## 2023-10-13 NOTE — Assessment & Plan Note (Signed)
 Noted on MRI 09/14/23, no history of CVA noted. Last imaging in 2019 did not see infarct present.  Will alert her neurologist.

## 2023-10-13 NOTE — Progress Notes (Signed)
 BP 116/68 (BP Location: Left Arm, Patient Position: Sitting, Cuff Size: Large)   Pulse 73   Temp 98.9 F (37.2 C) (Oral)   Resp 15   Ht 5' 4.02 (1.626 m)   Wt 235 lb 12.8 oz (107 kg)   SpO2 98%   BMI 40.45 kg/m    Subjective:    Patient ID: Sarah [name], female    DOB: 08/26/1950, 73 y.o.   MRN: [id number]  HPI: Sarah [name] is a 73 y.o. female  Chief Complaint  Patient presents with   Hospitalization Follow-up    IP for syncope then to rehab x 7 days at [facility]. Since being home some days are good some days. Dizziness is the biggest concern.    Refer to cardiology and ENT -- Zio  Transition of [facility] Follow up.  Initially went into hospital on 08/30/23 and was admitted, then had readmissions on 09/04/23 and 09/14/23.  Was readmitted on 09/16/23 due to syncope and weakness.  Went to rehab at [facility] for 7 days, continues to perform PT twice a week outpatient.  PT helps with the weakness she reports.  Continues to feel dizzy all the time, took Meclizine  but recently ran out.  This does help her.  Was taking twice a day. Echo on 09/05/23 noted EF 55-60%. MRI in hospital did note a remote left cerebellar infarct.  Continues to follow with psychiatry for mood and insomnia. Last visit 07/13/23, returns in October.Is taking Belsomra .  Hospital Course: Assessment and Plan:   73 y.o. female with medical history significant of T2DM with neuropathy, depression, adrenal insufficiency, malignant melanoma, chronic HFpEF, hyperlipidemia, stage III CKD with recent admission for anaphylaxis, dress syndrome, acute hypoxic respiratory failure with concern for pneumonia presenting with syncope    8/27: PT, OT eval - SNF   Assessment & Plan:   Principal Problem:   Syncope Active Problems:   Leukocytosis   Acute kidney injury superimposed on chronic kidney disease (HCC)   DRESS syndrome   Acute hypoxic respiratory failure (HCC)   Hypertension  associated with diabetes (HCC)   DM2 (diabetes mellitus, type 2) (HCC)   Adrenal insufficiency (Addison's disease) (HCC)   (HFpEF) heart failure with preserved ejection fraction (HCC)   * PreSyncope/Syncope Noted syncopal episodes x 2 with outpatient PCP follow-up Symptoms predominantly going from sitting to standing Markedly dry in exam on admission but improving with hydration. Suspect multifactorial contributions of dehydration, adrenal insufficiency Stress dose Solu-Cortef  - change to home dose at DC. She is not interested in high dose steroids. 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   Acute kidney injury superimposed on chronic kidney disease (HCC) Creatinine improving with hydration. Likely prerenal etio due to dehydration  Hospital/Facility: [facility] D/C Physician: Dr. Maree D/C Date: 09/16/23  Records Requested: 10/13/23 Records Received: 10/13/23 Records Reviewed: 10/13/23  Diagnoses on Discharge: Syncope  Date of interactive Contact within 48 hours of discharge:  Contact was through: none  Date of 7 day or 14 day face-to-face  visit:    over 14 days  Outpatient Encounter Medications as of 10/13/2023  Medication Sig   amitriptyline  (ELAVIL ) 50 MG tablet Take 1 tablet (50 mg total) by mouth at bedtime.   amLODipine  (NORVASC ) 2.5 MG tablet Take 1 tablet (2.5 mg total) by mouth daily.   Blood Glucose Monitoring Suppl (ONETOUCH VERIO) w/Device KIT Use to check blood sugar 3 times a day and document results, bring to appointments.  Goal  is <130 fasting blood sugar and <180 two hours after meals.   Cholecalciferol (VITAMIN D ) 50 MCG (2000 UT) CAPS Take 1 capsule by mouth at bedtime.   Cyanocobalamin (VITAMIN B-12 PO) Take 1 tablet by mouth at bedtime.   EPINEPHrine  (EPIPEN  2-PAK) 0.3 mg/0.3 mL IJ SOAJ injection Inject 0.3 mg into the muscle as needed for anaphylaxis.   FIBER ADULT GUMMIES PO Take 1 tablet by mouth daily as needed.   furosemide  (LASIX ) 40 MG tablet TAKE 1 TABLET BY MOUTH EVERY DAY   hydrocortisone  (CORTEF ) 5 MG tablet TAKE THREE TABLETS IN THE MORNING AND ONE TABLET IN THE AFTERNOON (Patient taking differently: Take 5 mg by mouth daily.)   losartan  (COZAAR ) 50 MG tablet Take 50 mg by mouth daily.   metFORMIN  (GLUCOPHAGE ) 1000 MG tablet Take 1 tablet (1,000 mg total) by mouth 2 (two) times daily with a meal.   OneTouch UltraSoft 2 Lancets MISC USE TO CHECK BLOOD SUGAR 3 TIMES A DAY AND DOCUMENT RESULTS, BRING TO APPOINTMENTS. GOAL IS <130 FASTING BLOOD SUGAR AND <180 TWO HOURS AFTER MEALS.   pantoprazole  (PROTONIX ) 20 MG tablet Take 1 tablet (20 mg total) by mouth daily.   pregabalin  (LYRICA ) 150 MG capsule Take 1 capsule (150 mg total) by mouth 2 (two) times daily. 150 mg in am, 150 MG in afternoon, and 300 mg in pm   rosuvastatin  (CRESTOR ) 10 MG tablet Take 1 tablet (10 mg total) by mouth daily.   Suvorexant  (BELSOMRA ) 15 MG TABS Take 1 tablet (15 mg total) by mouth at bedtime as needed.   [DISCONTINUED] diphenhydrAMINE  (BENADRYL ) 25 mg capsule Take 1 capsule (25 mg total) by mouth every 6 (six) hours as needed for itching.   meclizine  (ANTIVERT ) 25 MG tablet Take 1 tablet (25 mg total) by mouth 3 (three) times daily as needed for dizziness.   [DISCONTINUED] Baclofen  5 MG TABS TAKE 1 TABLET BY MOUTH EVERY 8 HOURS AS NEEDED (Patient not taking: Reported on 10/13/2023)   [DISCONTINUED] meclizine  (ANTIVERT ) 25 MG tablet Take 1 tablet (25 mg total) by mouth 3 (three) times daily as needed for dizziness. (Patient not  taking: Reported on 10/13/2023)   No facility-administered encounter medications on file as of 10/13/2023.    Diagnostic Tests Reviewed/Disposition:     Latest Ref Rng & Units 09/23/2023   12:00 AM 09/16/2023    6:41 AM 09/15/2023    6:00 AM  CBC  WBC  8.1     4.7  6.7   Hemoglobin 12.0 - 16.0 12.2     10.6  11.4   Hematocrit 36 - 46 38     31.7  34.9   Platelets 150 - 400 K/uL 219     167  225      This result is from an external source.       Latest Ref Rng & Units 09/23/2023   12:00 AM 09/16/2023    6:41 AM 09/15/2023    6:00 AM  CMP  Glucose 70 - 99 mg/dL  800  829  BUN 4 - 21 13     17  18    Creatinine 0.5 - 1.1 0.9     0.93  1.20   Sodium 137 - 147 142     141  139   Potassium 3.5 - 5.1 mEq/L 3.6     3.6  3.6   Chloride 99 - 108 98     102  99   CO2 13 - 22 31     26  28    Calcium  8.7 - 10.7 8.6     8.7  8.4   Total Protein 6.5 - 8.1 g/dL  5.8  6.4   Total Bilirubin 0.0 - 1.2 mg/dL  0.6  1.2   Alkaline Phos 38 - 126 U/L  49  54   AST 15 - 41 U/L  12  11   ALT 0 - 44 U/L  14  17      This result is from an external source.    Consults: none  Discharge Instructions: Follow-up with PCP after discharged from rehab  Disease/illness Education: Reviewed at length with patient today  Home Health/Community Services Discussions/Referrals: was in rehab at [facility] or re-establishment of referral orders for community resources: none  Discussion with other health care providers: Reviewed all recent notea  Assessment and Support of treatment regimen adherence: Reviewed at length with patient today  Appointments Coordinated with: Reviewed at length with patient today  Education for self-management, independent living, and ADLs:  Reviewed at length with patient today  DIZZINESS Follows with neurology, [facility] in [place].  Last saw 02/02/23.   Follows for axonal sensorimotor neuropathy.  Dizziness started recently, after her first hospitalization. Duration:  weeks Description of symptoms: light-headed, like on cruise ship going back and forth Duration of episode: can last all day at times Dizziness frequency: no history of the same Provoking factors: getting up too quickly Aggravating factors:  as above Triggered by rolling over in bed: no Triggered by bending over: yes Aggravated by head movement: no Aggravated by exertion, coughing, loud noises: no Recent head injury: no Recent or current viral symptoms: no History of vasovagal episodes: no Nausea: no Vomiting: no Tinnitus: yes Hearing loss: no Aural fullness: no Headache: no Photophobia/phonophobia: yes Unsteady gait: yes Postural instability: no Diplopia, dysarthria, dysphagia or weakness: no Related to exertion: no Pallor: no Diaphoresis: no Dyspnea: yes with dizziness Chest pain: indigestion -- having more often -- has not seen a heart doctor in many years     09/14/2023   10:07 AM 04/15/2023    9:30 AM 04/07/2023    1:42 PM 12/30/2022    1:21 PM 09/29/2022    3:50 PM  Depression screen PHQ 2/9  Decreased Interest 2 1 1  0 0  Down, Depressed, Hopeless 1 0 1 0 0  PHQ - 2 Score 3 1 2  0 0  Altered sleeping 0 1 1 0 0  Tired, decreased energy 2 1 1  0 0  Change in appetite 0 0 1 0 0  Feeling bad or failure about yourself  1 0 0 0 0  Trouble concentrating 1 0 0 0 0  Moving slowly or fidgety/restless 1 0 0 0 0  Suicidal thoughts 0 0 0 0 0  PHQ-9 Score 8 3 5  0 0  Difficult doing work/chores Somewhat difficult Not difficult at all Somewhat difficult Not difficult at all Not difficult at all       09/14/2023   10:07 AM 04/15/2023  9:30 AM 04/07/2023    1:42 PM 12/30/2022    1:23 PM  GAD 7 : Generalized Anxiety Score  Nervous, Anxious, on Edge 1 0 1 1  Control/stop worrying 0 0 0 0  Worry too much - different things 1 0 0 0  Trouble relaxing 0 0 0 0  Restless 0 0 0 0  Easily annoyed or irritable 0 0 0 0  Afraid - awful might happen 1 0 1 0  Total GAD 7 Score 3 0 2 1   Anxiety Difficulty Not difficult at all Not difficult at all Somewhat difficult Not difficult at all      Relevant past medical, surgical, family and social history reviewed and updated as indicated. Interim medical history since our last visit reviewed. Allergies and medications reviewed and updated.  Review of Systems  Constitutional:  Positive for appetite change and fatigue. Negative for activity change, chills and fever.  HENT:  Positive for tinnitus.   Respiratory:  Positive for shortness of breath. Negative for cough, chest tightness and wheezing.   Cardiovascular:  Positive for chest pain (indigestion) and palpitations (notices once or twice a day, beating really fast). Negative for leg swelling.  Gastrointestinal: Negative.   Endocrine: Negative for polydipsia, polyphagia and polyuria.  Neurological:  Positive for dizziness, syncope, weakness and light-headedness. Negative for facial asymmetry, speech difficulty and headaches.  Psychiatric/Behavioral:  Positive for sleep disturbance. Negative for decreased concentration, self-injury and suicidal ideas. The patient is nervous/anxious.     Per HPI unless specifically indicated above     Objective:    BP 116/68 (BP Location: Left Arm, Patient Position: Sitting, Cuff Size: Large)   Pulse 73   Temp 98.9 F (37.2 C) (Oral)   Resp 15   Ht 5' 4.02 (1.626 m)   Wt 235 lb 12.8 oz (107 kg)   SpO2 98%   BMI 40.45 kg/m   Wt Readings from Last 3 Encounters:  10/13/23 235 lb 12.8 oz (107 kg)  09/24/23 232 lb 6.4 oz (105.4 kg)  09/17/23 233 lb 3.2 oz (105.8 kg)    Physical Exam Vitals and nursing note reviewed.  Constitutional:      General: She is awake. She is not in acute distress.    Appearance: She is well-developed and well-groomed. She is obese. She is not ill-appearing or toxic-appearing.  HENT:     Head: Normocephalic.     Right Ear: Hearing, tympanic membrane, ear canal and external ear normal. No middle ear  effusion. There is no impacted cerumen. Tympanic membrane is not injected.     Left Ear: Hearing, tympanic membrane, ear canal and external ear normal.  No middle ear effusion. There is no impacted cerumen. Tympanic membrane is not injected.     Nose: Nose normal.     Mouth/Throat:     Mouth: Mucous membranes are moist.     Pharynx: Oropharynx is clear.  Eyes:     General: Lids are normal.        Right eye: No discharge.        Left eye: No discharge.     Conjunctiva/sclera: Conjunctivae normal.     Pupils: Pupils are equal, round, and reactive to light.  Neck:     Thyroid : No thyromegaly.     Vascular: No carotid bruit.  Cardiovascular:     Rate and Rhythm: Normal rate and regular rhythm.     Heart sounds: Normal heart sounds. No murmur heard.    No gallop.  Pulmonary:     Effort: Pulmonary effort is normal. No accessory muscle usage or respiratory distress.     Breath sounds: Normal breath sounds. No decreased breath sounds, wheezing or rales.  Abdominal:     General: Bowel sounds are normal. There is no distension.     Palpations: Abdomen is soft.     Tenderness: There is no abdominal tenderness.  Musculoskeletal:     Cervical back: Normal range of motion and neck supple.     Right lower leg: No edema.     Left lower leg: No edema.  Lymphadenopathy:     Cervical: No cervical adenopathy.  Skin:    General: Skin is warm and dry.  Neurological:     Mental Status: She is alert and oriented to person, place, and time.     Cranial Nerves: Cranial nerves 2-12 are intact.     Motor: Weakness (mild still present, but improved from last assessment) present. No tremor.     Coordination: Coordination is intact.     Gait: Gait is intact.     Deep Tendon Reflexes: Reflexes are normal and symmetric.     Reflex Scores:      Brachioradialis reflexes are 2+ on the right side and 2+ on the left side.      Patellar reflexes are 2+ on the right side and 2+ on the left side.    Comments:  Using walker, which has offered benefit.  Psychiatric:        Attention and Perception: Attention normal.        Mood and Affect: Mood normal.        Speech: Speech normal.        Behavior: Behavior normal. Behavior is cooperative.        Thought Content: Thought content normal.     Results for orders placed or performed in visit on 10/13/23  Microalbumin, Urine Waived   Collection Time: 10/13/23  9:03 AM  Result Value Ref Range   Microalb, Ur Waived 80 (H) 0 - 19 mg/L   Creatinine, Urine Waived 200 10 - 300 mg/dL   Microalb/Creat Ratio 30-300 (H) <30 mg/g      Assessment & Plan:   Problem List Items Addressed This Visit       Respiratory   Gram-negative pneumonia (HCC) - Primary   Acute and improving.  Lung sounds reassuring today on exam. Plan on repeat imaging around 10/26/23 or order at next visit.          Endocrine   Type 2 diabetes mellitus with obesity   Chronic, ongoing with A1c 6.9% on recent hospital check, urine ALB 80 September 2025  Taking Metformin  1000 MG BID and tolerating.  Recommend check BS fasting every morning and document for visits.  Recommend heavy focus on diabetic diet at home and regular exercise. - Eye exam up to date. Will get records. - Foot exam up to date - ARB and Statin on board - Pneumococcal and Flu vaccines up to date.      Relevant Medications   losartan  (COZAAR ) 50 MG tablet   Other Relevant Orders   Microalbumin, Urine Waived (Completed)   Hyperlipidemia associated with type 2 diabetes mellitus (HCC)   Relevant Medications   losartan  (COZAAR ) 50 MG tablet   Other Relevant Orders   Comprehensive metabolic panel with GFR   Lipid Panel w/o Chol/HDL Ratio     Nervous and Auditory   Cerebral infarction, remote, resolved   Noted on MRI  09/14/23, no history of CVA noted. Last imaging in 2019 did not see infarct present.  Will alert her neurologist.        Musculoskeletal and Integument   DRESS syndrome   Due to recent allergic  reaction to Clindamycin for dental abscess.  Overall rash improved.  Recommend continue OTC Sarna lotion. Would avoid Benadryl  due to age, can take Claritin or Zyrtec daily if needed.        Genitourinary   Acute kidney injury superimposed on chronic kidney disease   Acute and improving prior to discharge.  Recheck labs today and ensure good hydration at home.        Other   Recurrent major depression in partial remission   Chronic, stable.  Denies SI/HI.  Followed by psychiatry -- recent notes reviewed and will maintain current medication regimen as prescribed by them.      Dizziness   Ongoing issues for weeks now -- has some cardiac symptoms (tachycardia episodes and SOB) and some ENT symptoms (tinnitus) with this.  Will order Zio monitor to further assess her tachycardia episodes.  Referral to cardiology to further assess due to risk factors for cardiac event and recent symptoms.  Referral to ENT to further assess tinnitus with the dizziness.  Determine next steps after all results and notes returned.  Refills on Meclizine  sent in. Reassuring neuro exam today.      Relevant Orders   Ambulatory referral to Cardiology   Ambulatory referral to ENT   LONG TERM MONITOR XT (3-14 DAYS)     Follow up plan: Return in about 4 weeks (around 11/10/2023) for DIZZINESS, GERD, AND WEAKNESS.

## 2023-10-13 NOTE — Assessment & Plan Note (Signed)
Chronic, stable.  Denies SI/HI.  Followed by psychiatry -- recent notes reviewed and will maintain current medication regimen as prescribed by them. 

## 2023-10-14 ENCOUNTER — Ambulatory Visit: Attending: Nurse Practitioner

## 2023-10-14 ENCOUNTER — Ambulatory Visit: Payer: Self-pay | Admitting: Nurse Practitioner

## 2023-10-14 DIAGNOSIS — R42 Dizziness and giddiness: Secondary | ICD-10-CM

## 2023-10-14 LAB — COMPREHENSIVE METABOLIC PANEL WITH GFR
ALT: 9 IU/L (ref 0–32)
AST: 5 IU/L (ref 0–40)
Albumin: 3.8 g/dL (ref 3.8–4.8)
Alkaline Phosphatase: 76 IU/L (ref 49–135)
BUN/Creatinine Ratio: 23 (ref 12–28)
BUN: 26 mg/dL (ref 8–27)
Bilirubin Total: 0.3 mg/dL (ref 0.0–1.2)
CO2: 19 mmol/L — ABNORMAL LOW (ref 20–29)
Calcium: 9 mg/dL (ref 8.7–10.3)
Chloride: 97 mmol/L (ref 96–106)
Creatinine, Ser: 1.12 mg/dL — ABNORMAL HIGH (ref 0.57–1.00)
Globulin, Total: 1.7 g/dL (ref 1.5–4.5)
Glucose: 334 mg/dL — ABNORMAL HIGH (ref 70–99)
Potassium: 4.3 mmol/L (ref 3.5–5.2)
Sodium: 134 mmol/L (ref 134–144)
Total Protein: 5.5 g/dL — ABNORMAL LOW (ref 6.0–8.5)
eGFR: 52 mL/min/1.73 — ABNORMAL LOW (ref >59)

## 2023-10-14 LAB — LIPID PANEL W/O CHOL/HDL RATIO
Cholesterol, Total: 136 mg/dL (ref 100–199)
HDL: 57 mg/dL (ref >39)
LDL Chol Calc (NIH): 46 mg/dL (ref 0–99)
Triglycerides: 206 mg/dL — ABNORMAL HIGH (ref 0–149)
VLDL Cholesterol Cal: 33 mg/dL (ref 5–40)

## 2023-10-14 NOTE — Progress Notes (Signed)
 Contacted via MyChart  Good day Marisha, your labs have returned: - Kidney function, eGFR and creatinine, showing Stage 3a kidney disease which fluctuates with you.  We will continue to monitor. Liver function, AST and ALT, normal. - Glucose (sugar) is quite high, has you just ate before visit.  Check these at home every morning fasting and if consistently >130 let me know. - Lipid panel shows LDL at goal.  Any questions? Keep being stellar!!  Thank you for allowing me to participate in your care.  I appreciate you. Kindest regards, Laiken Nohr

## 2023-10-14 NOTE — Progress Notes (Signed)
 Called patient to get her scheduled for AWV

## 2023-10-18 ENCOUNTER — Ambulatory Visit: Admitting: Dermatology

## 2023-10-26 NOTE — Progress Notes (Signed)
 Appointment has been made

## 2023-11-03 ENCOUNTER — Ambulatory Visit: Admitting: Gastroenterology

## 2023-11-04 ENCOUNTER — Ambulatory Visit: Payer: Self-pay

## 2023-11-04 NOTE — Telephone Encounter (Signed)
 FYI Only or Action Required?: Action required by provider: refusing Er until tomorrow.  Patient was last seen in primary care on 10/13/2023 by Cannady, [name] T, NP.  Called Nurse Triage reporting Edema.  Symptoms began yesterday.  Interventions attempted: Rest, hydration, or home remedies and Other: compression, elevation.  Symptoms are: gradually worsening.  Triage Disposition: Go to ED Now (or PCP Triage)  Patient/caregiver understands and will follow disposition?: Yes, but will wait   Copied from CRM [id number]. Topic: Clinical - Red Word Triage >> Nov 04, 2023  4:49 PM Turkey B wrote: [facility] that prompted transfer to Nurse Triage: Patient left leg is swollen Reason for Disposition  [1] Difficulty breathing with exertion (e.g., walking) and [2] NEW or getting WORSE  Answer Assessment - Initial Assessment Questions Additional info:  Refused ER today, she states she will go tomorrow. Or will PCP work her in? Called to [facility] to report she is refusing ER today, she will wait until tomorrow, but will call 911 if worsening symptoms   1. ONSET: When did the swelling start? (e.g., minutes, hours, days)     yesterday 2. LOCATION: What part of the leg is swollen?  Are both legs swollen or just one leg?     Left leg up to thigh 3. SEVERITY: How bad is the swelling? (e.g., localized; mild, moderate, severe)     Double size  4. REDNESS: Is there redness or signs of infection?     Some redness in spots.  5. PAIN: Is the swelling painful to touch? If Yes, ask: How painful is it?   (Scale 1-10; mild, moderate or severe)     When walking it is painful.  6. FEVER: Do you have a fever? If Yes, ask: What is it, how was it measured, and when did it start?      Denies  7. CAUSE: What do you think is causing the leg swelling?     unsure 8. MEDICAL HISTORY: Do you have a history of blood clots (e.g., DVT), cancer, heart failure, kidney disease, or liver failure?      9.  RECURRENT SYMPTOM: Have you had leg swelling before? If Yes, ask: When was the last time? What happened that time?     Yes but in right leg  10. OTHER SYMPTOMS: Do you have any other symptoms? (e.g., chest pain, difficulty breathing)       Shortness of breath  11. PREGNANCY: Is there any chance you are pregnant? When was your last menstrual period?  Protocols used: Leg Swelling and Edema-A-AH

## 2023-11-05 ENCOUNTER — Emergency Department

## 2023-11-05 ENCOUNTER — Ambulatory Visit: Payer: Self-pay

## 2023-11-05 ENCOUNTER — Ambulatory Visit: Admission: EM | Admit: 2023-11-05 | Discharge: 2023-11-05 | Disposition: A | Discharge status: Home / Self Care

## 2023-11-05 ENCOUNTER — Other Ambulatory Visit: Payer: Self-pay

## 2023-11-05 ENCOUNTER — Inpatient Hospital Stay
Admission: EM | Admit: 2023-11-05 | Discharge: 2023-11-10 | DRG: 271 | Disposition: A | Discharge status: Skilled Nursing Facility | Source: Ambulatory Visit | Attending: Internal Medicine | Admitting: Internal Medicine

## 2023-11-05 DIAGNOSIS — M7989 Other specified soft tissue disorders: Secondary | ICD-10-CM

## 2023-11-05 DIAGNOSIS — Z886 Allergy status to analgesic agent status: Secondary | ICD-10-CM

## 2023-11-05 DIAGNOSIS — I82432 Acute embolism and thrombosis of left popliteal vein: Secondary | ICD-10-CM | POA: Diagnosis present

## 2023-11-05 DIAGNOSIS — Z794 Long term (current) use of insulin: Secondary | ICD-10-CM | POA: Diagnosis not present

## 2023-11-05 DIAGNOSIS — F418 Other specified anxiety disorders: Secondary | ICD-10-CM | POA: Diagnosis not present

## 2023-11-05 DIAGNOSIS — F419 Anxiety disorder, unspecified: Secondary | ICD-10-CM | POA: Diagnosis present

## 2023-11-05 DIAGNOSIS — G4700 Insomnia, unspecified: Secondary | ICD-10-CM | POA: Diagnosis present

## 2023-11-05 DIAGNOSIS — I82492 Acute embolism and thrombosis of other specified deep vein of left lower extremity: Secondary | ICD-10-CM | POA: Diagnosis present

## 2023-11-05 DIAGNOSIS — F32A Depression, unspecified: Secondary | ICD-10-CM | POA: Diagnosis present

## 2023-11-05 DIAGNOSIS — E785 Hyperlipidemia, unspecified: Secondary | ICD-10-CM

## 2023-11-05 DIAGNOSIS — Z801 Family history of malignant neoplasm of trachea, bronchus and lung: Secondary | ICD-10-CM

## 2023-11-05 DIAGNOSIS — Z9221 Personal history of antineoplastic chemotherapy: Secondary | ICD-10-CM

## 2023-11-05 DIAGNOSIS — Z8249 Family history of ischemic heart disease and other diseases of the circulatory system: Secondary | ICD-10-CM

## 2023-11-05 DIAGNOSIS — R0602 Shortness of breath: Secondary | ICD-10-CM | POA: Diagnosis not present

## 2023-11-05 DIAGNOSIS — Z880 Allergy status to penicillin: Secondary | ICD-10-CM | POA: Diagnosis not present

## 2023-11-05 DIAGNOSIS — Z6841 Body Mass Index (BMI) 40.0 and over, adult: Secondary | ICD-10-CM | POA: Diagnosis not present

## 2023-11-05 DIAGNOSIS — I82412 Acute embolism and thrombosis of left femoral vein: Principal | ICD-10-CM | POA: Diagnosis present

## 2023-11-05 DIAGNOSIS — E66813 Obesity, class 3: Secondary | ICD-10-CM | POA: Diagnosis present

## 2023-11-05 DIAGNOSIS — Z981 Arthrodesis status: Secondary | ICD-10-CM | POA: Diagnosis not present

## 2023-11-05 DIAGNOSIS — T380X5A Adverse effect of glucocorticoids and synthetic analogues, initial encounter: Secondary | ICD-10-CM | POA: Diagnosis present

## 2023-11-05 DIAGNOSIS — Z7984 Long term (current) use of oral hypoglycemic drugs: Secondary | ICD-10-CM | POA: Diagnosis not present

## 2023-11-05 DIAGNOSIS — Z79899 Other long term (current) drug therapy: Secondary | ICD-10-CM

## 2023-11-05 DIAGNOSIS — D638 Anemia in other chronic diseases classified elsewhere: Secondary | ICD-10-CM | POA: Diagnosis present

## 2023-11-05 DIAGNOSIS — Z823 Family history of stroke: Secondary | ICD-10-CM

## 2023-11-05 DIAGNOSIS — I152 Hypertension secondary to endocrine disorders: Secondary | ICD-10-CM | POA: Diagnosis present

## 2023-11-05 DIAGNOSIS — I82409 Acute embolism and thrombosis of unspecified deep veins of unspecified lower extremity: Secondary | ICD-10-CM | POA: Diagnosis present

## 2023-11-05 DIAGNOSIS — I824Y2 Acute embolism and thrombosis of unspecified deep veins of left proximal lower extremity: Secondary | ICD-10-CM

## 2023-11-05 DIAGNOSIS — M797 Fibromyalgia: Secondary | ICD-10-CM | POA: Diagnosis present

## 2023-11-05 DIAGNOSIS — I5030 Unspecified diastolic (congestive) heart failure: Secondary | ICD-10-CM | POA: Diagnosis not present

## 2023-11-05 DIAGNOSIS — I82402 Acute embolism and thrombosis of unspecified deep veins of left lower extremity: Secondary | ICD-10-CM | POA: Diagnosis not present

## 2023-11-05 DIAGNOSIS — E271 Primary adrenocortical insufficiency: Secondary | ICD-10-CM | POA: Diagnosis present

## 2023-11-05 DIAGNOSIS — E119 Type 2 diabetes mellitus without complications: Secondary | ICD-10-CM | POA: Diagnosis not present

## 2023-11-05 DIAGNOSIS — E1169 Type 2 diabetes mellitus with other specified complication: Secondary | ICD-10-CM | POA: Diagnosis present

## 2023-11-05 DIAGNOSIS — Z85820 Personal history of malignant melanoma of skin: Secondary | ICD-10-CM | POA: Diagnosis not present

## 2023-11-05 DIAGNOSIS — E66812 Obesity, class 2: Secondary | ICD-10-CM | POA: Insufficient documentation

## 2023-11-05 DIAGNOSIS — Z833 Family history of diabetes mellitus: Secondary | ICD-10-CM | POA: Diagnosis not present

## 2023-11-05 DIAGNOSIS — Z881 Allergy status to other antibiotic agents status: Secondary | ICD-10-CM

## 2023-11-05 DIAGNOSIS — M79605 Pain in left leg: Principal | ICD-10-CM

## 2023-11-05 DIAGNOSIS — E1159 Type 2 diabetes mellitus with other circulatory complications: Secondary | ICD-10-CM | POA: Diagnosis present

## 2023-11-05 DIAGNOSIS — E669 Obesity, unspecified: Secondary | ICD-10-CM | POA: Diagnosis not present

## 2023-11-05 DIAGNOSIS — K219 Gastro-esophageal reflux disease without esophagitis: Secondary | ICD-10-CM | POA: Diagnosis present

## 2023-11-05 DIAGNOSIS — Z888 Allergy status to other drugs, medicaments and biological substances status: Secondary | ICD-10-CM

## 2023-11-05 DIAGNOSIS — D72829 Elevated white blood cell count, unspecified: Secondary | ICD-10-CM | POA: Diagnosis present

## 2023-11-05 DIAGNOSIS — E7849 Other hyperlipidemia: Secondary | ICD-10-CM | POA: Diagnosis present

## 2023-11-05 DIAGNOSIS — I82422 Acute embolism and thrombosis of left iliac vein: Secondary | ICD-10-CM | POA: Diagnosis not present

## 2023-11-05 DIAGNOSIS — I5032 Chronic diastolic (congestive) heart failure: Secondary | ICD-10-CM | POA: Diagnosis present

## 2023-11-05 DIAGNOSIS — I871 Compression of vein: Secondary | ICD-10-CM | POA: Diagnosis not present

## 2023-11-05 HISTORY — DX: Acute embolism and thrombosis of unspecified deep veins of unspecified lower extremity: I82.409

## 2023-11-05 LAB — BASIC METABOLIC PANEL WITH GFR
Anion gap: 12 (ref 5–15)
BUN: 19 mg/dL (ref 8–23)
CO2: 24 mmol/L (ref 22–32)
Calcium: 8.8 mg/dL — ABNORMAL LOW (ref 8.9–10.3)
Chloride: 99 mmol/L (ref 98–111)
Creatinine, Ser: 0.94 mg/dL (ref 0.44–1.00)
GFR, Estimated: >60 mL/min (ref >60)
Glucose, Bld: 196 mg/dL — ABNORMAL HIGH (ref 70–99)
Potassium: 3.8 mmol/L (ref 3.5–5.1)
Sodium: 135 mmol/L (ref 135–145)

## 2023-11-05 LAB — CBC
HCT: 33.7 % — ABNORMAL LOW (ref 36.0–46.0)
Hemoglobin: 11 g/dL — ABNORMAL LOW (ref 12.0–15.0)
MCH: 28.7 pg (ref 26.0–34.0)
MCHC: 32.6 g/dL (ref 30.0–36.0)
MCV: 88 fL (ref 80.0–100.0)
Platelets: 186 K/uL (ref 150–400)
RBC: 3.83 MIL/uL — ABNORMAL LOW (ref 3.87–5.11)
RDW: 14.1 % (ref 11.5–15.5)
WBC: 13.4 K/uL — ABNORMAL HIGH (ref 4.0–10.5)
nRBC: 0 % (ref 0.0–0.2)

## 2023-11-05 LAB — PROTIME-INR
INR: 1 (ref 0.8–1.2)
Prothrombin Time: 13.9 s (ref 11.4–15.2)

## 2023-11-05 LAB — D-DIMER, QUANTITATIVE: D-Dimer, Quant: 2.39 ug{FEU}/mL — ABNORMAL HIGH (ref 0.00–0.50)

## 2023-11-05 LAB — APTT: aPTT: 23 s — ABNORMAL LOW (ref 24–36)

## 2023-11-05 LAB — BRAIN NATRIURETIC PEPTIDE: B Natriuretic Peptide: 38.3 pg/mL (ref 0.0–100.0)

## 2023-11-05 LAB — GLUCOSE, CAPILLARY: Glucose-Capillary: 182 mg/dL — ABNORMAL HIGH (ref 70–99)

## 2023-11-05 LAB — TROPONIN I (HIGH SENSITIVITY)
Troponin I (High Sensitivity): 7 ng/L (ref <18)
Troponin I (High Sensitivity): 6 ng/L

## 2023-11-05 MED ORDER — SUVOREXANT 15 MG PO TABS: 15.0000 mg | ORAL_TABLET | Freq: Every evening | ORAL | Status: DC | PRN

## 2023-11-05 MED ORDER — HYDROCORTISONE SOD SUC (PF) 100 MG IJ SOLR
50.0000 mg | Freq: Three times a day (TID) | INTRAMUSCULAR | Status: DC
  Administered 2023-11-05 – 2023-11-08 (×9): 50 mg via INTRAVENOUS
  Filled 2023-11-05 (×10): qty 1

## 2023-11-05 MED ORDER — ONDANSETRON HCL 4 MG/2ML IJ SOLN: 4.0000 mg | Freq: Three times a day (TID) | INTRAMUSCULAR | Status: DC | PRN

## 2023-11-05 MED ORDER — ROSUVASTATIN CALCIUM 10 MG PO TABS
10.0000 mg | ORAL_TABLET | Freq: Every day | ORAL | Status: DC
  Administered 2023-11-05 – 2023-11-09 (×5): 10 mg via ORAL
  Filled 2023-11-05 (×5): qty 1

## 2023-11-05 MED ORDER — OXYCODONE-ACETAMINOPHEN 5-325 MG PO TABS
1.0000 | ORAL_TABLET | ORAL | Status: DC | PRN
  Administered 2023-11-05 – 2023-11-06 (×2): 1 via ORAL
  Filled 2023-11-05 (×2): qty 1

## 2023-11-05 MED ORDER — HEPARIN BOLUS VIA INFUSION
5000.0000 [IU] | Freq: Once | INTRAVENOUS | Status: AC
  Administered 2023-11-05: 5000 [IU] via INTRAVENOUS
  Filled 2023-11-05: qty 5000

## 2023-11-05 MED ORDER — PREGABALIN 75 MG PO CAPS
300.0000 mg | ORAL_CAPSULE | Freq: Every day | ORAL | Status: DC
  Administered 2023-11-05 – 2023-11-09 (×5): 300 mg via ORAL
  Filled 2023-11-05 (×5): qty 4

## 2023-11-05 MED ORDER — AMLODIPINE BESYLATE 5 MG PO TABS
2.5000 mg | ORAL_TABLET | Freq: Every day | ORAL | Status: DC
  Administered 2023-11-06 – 2023-11-10 (×5): 2.5 mg via ORAL
  Filled 2023-11-05 (×5): qty 1

## 2023-11-05 MED ORDER — INSULIN ASPART 100 UNIT/ML IJ SOLN
0.0000 [IU] | Freq: Three times a day (TID) | INTRAMUSCULAR | Status: DC
  Administered 2023-11-06: 5 [IU] via SUBCUTANEOUS
  Administered 2023-11-06: 2 [IU] via SUBCUTANEOUS
  Administered 2023-11-06: 3 [IU] via SUBCUTANEOUS
  Administered 2023-11-07: 5 [IU] via SUBCUTANEOUS
  Administered 2023-11-07 (×2): 3 [IU] via SUBCUTANEOUS
  Administered 2023-11-08: 5 [IU] via SUBCUTANEOUS
  Administered 2023-11-08 (×2): 3 [IU] via SUBCUTANEOUS
  Administered 2023-11-09: 2 [IU] via SUBCUTANEOUS
  Administered 2023-11-09: 1 [IU] via SUBCUTANEOUS
  Administered 2023-11-09: 9 [IU] via SUBCUTANEOUS
  Administered 2023-11-10: 2 [IU] via SUBCUTANEOUS
  Administered 2023-11-10: 3 [IU] via SUBCUTANEOUS
  Filled 2023-11-05 (×14): qty 1

## 2023-11-05 MED ORDER — IOHEXOL 350 MG/ML SOLN
100.0000 mL | Freq: Once | INTRAVENOUS | Status: AC | PRN
  Administered 2023-11-05: 100 mL via INTRAVENOUS

## 2023-11-05 MED ORDER — ALBUTEROL SULFATE (2.5 MG/3ML) 0.083% IN NEBU: 2.5000 mg | INHALATION_SOLUTION | RESPIRATORY_TRACT | Status: DC | PRN

## 2023-11-05 MED ORDER — INSULIN ASPART 100 UNIT/ML IJ SOLN
0.0000 [IU] | Freq: Every day | INTRAMUSCULAR | Status: DC
  Administered 2023-11-06 – 2023-11-07 (×2): 2 [IU] via SUBCUTANEOUS
  Administered 2023-11-08: 5 [IU] via SUBCUTANEOUS
  Filled 2023-11-05 (×3): qty 1

## 2023-11-05 MED ORDER — PANTOPRAZOLE SODIUM 20 MG PO TBEC
20.0000 mg | DELAYED_RELEASE_TABLET | Freq: Every day | ORAL | Status: DC
  Administered 2023-11-06 – 2023-11-10 (×5): 20 mg via ORAL
  Filled 2023-11-05 (×5): qty 1

## 2023-11-05 MED ORDER — PREGABALIN 75 MG PO CAPS: 150.0000 mg | ORAL_CAPSULE | Freq: Two times a day (BID) | ORAL | Status: DC

## 2023-11-05 MED ORDER — AMITRIPTYLINE HCL 50 MG PO TABS
50.0000 mg | ORAL_TABLET | Freq: Every day | ORAL | Status: DC
Start: 2023-11-05 — End: 2023-11-10
  Administered 2023-11-05 – 2023-11-09 (×5): 50 mg via ORAL
  Filled 2023-11-05 (×5): qty 1

## 2023-11-05 MED ORDER — ACETAMINOPHEN 325 MG PO TABS: 650.0000 mg | ORAL_TABLET | Freq: Four times a day (QID) | ORAL | Status: DC | PRN

## 2023-11-05 MED ORDER — PREGABALIN 75 MG PO CAPS
150.0000 mg | ORAL_CAPSULE | Freq: Two times a day (BID) | ORAL | Status: DC
  Administered 2023-11-06 – 2023-11-10 (×9): 150 mg via ORAL
  Filled 2023-11-05 (×9): qty 2

## 2023-11-05 MED ORDER — FUROSEMIDE 40 MG PO TABS
40.0000 mg | ORAL_TABLET | Freq: Every day | ORAL | Status: DC
  Administered 2023-11-06 – 2023-11-10 (×5): 40 mg via ORAL
  Filled 2023-11-05 (×6): qty 1

## 2023-11-05 MED ORDER — HEPARIN (PORCINE) 25000 UT/250ML-% IV SOLN
1550.0000 [IU]/h | INTRAVENOUS | Status: DC
  Administered 2023-11-05: 1350 [IU]/h via INTRAVENOUS
  Administered 2023-11-06: 1500 [IU]/h via INTRAVENOUS
  Administered 2023-11-07 – 2023-11-08 (×2): 1550 [IU]/h via INTRAVENOUS
  Filled 2023-11-05 (×5): qty 250

## 2023-11-05 MED ORDER — LOSARTAN POTASSIUM 50 MG PO TABS
50.0000 mg | ORAL_TABLET | Freq: Every day | ORAL | Status: DC
  Administered 2023-11-06 – 2023-11-10 (×5): 50 mg via ORAL
  Filled 2023-11-05 (×5): qty 1

## 2023-11-05 MED ORDER — HYDRALAZINE HCL 20 MG/ML IJ SOLN: 5.0000 mg | INTRAMUSCULAR | Status: DC | PRN

## 2023-11-05 MED ORDER — MECLIZINE HCL 25 MG PO TABS: 25.0000 mg | ORAL_TABLET | Freq: Three times a day (TID) | ORAL | Status: DC | PRN

## 2023-11-05 NOTE — Discharge Instructions (Signed)
 Go to the emergency department for evaluation of your left leg swelling and your shortness of breath.

## 2023-11-05 NOTE — ED Triage Notes (Signed)
 Pt comes with sob since Tuesday and sever swelling in left leg. Pt states wheeping from leg too. Pt states more exertion when walking.

## 2023-11-05 NOTE — Telephone Encounter (Signed)
 FYI Only or Action Required?: FYI only for provider.  Patient was last seen in primary care on 10/13/2023 by Cannady, [name] T, NP.  Called Nurse Triage reporting Leg Swelling.  Symptoms began several days ago.  Interventions attempted: Rest, hydration, or home remedies.  Symptoms are: gradually worsening.  Triage Disposition: See HCP Within 4 Hours (Or PCP Triage)  Patient/caregiver understands and will follow disposition?: Yes  Copied from CRM #[id number]. Topic: Clinical - Red Word Triage >> Nov 05, 2023 11:27 AM Ivette P wrote: [facility] that prompted transfer to Nurse Triage: swelling opposite leg left discoloration and really red .  normally does. hard time breathing. Reason for Disposition  SEVERE leg swelling (e.g., swelling extends above knee, entire leg is swollen, weeping fluid)  Answer Assessment - Initial Assessment Questions Left leg edema and discoloration; redness; dark; weeping began Tuesday and has gotten continually worse since then. Medic at [facility] evaluated pt and recommended pcp visit today. No availability, recommendation made for UC visit. Pt and family agreeable. Pt family also requesting the available appointment next week with PCP  1. ONSET: When did the swelling start? (e.g., minutes, hours, days)     tuesday 2. LOCATION: What part of the leg is swollen?  Are both legs swollen or just one leg?     Left leg 3. SEVERITY: How bad is the swelling? (e.g., localized; mild, moderate, severe)     moderate 4. REDNESS: Is there redness or signs of infection?     yes 5. PAIN: Is the swelling painful to touch? If Yes, ask: How painful is it?   (Scale 1-10; mild, moderate or severe)     10/10 6. FEVER: Do you have a fever? If Yes, ask: What is it, how was it measured, and when did it start?      Denies; but states leg is warm to touch 7. CAUSE: What do you think is causing the leg swelling?     unsure 8. MEDICAL HISTORY: Do you have a history  of blood clots (e.g., DVT), cancer, heart failure, kidney disease, or liver failure?     htn 9. RECURRENT SYMPTOM: Have you had leg swelling before? If Yes, ask: When was the last time? What happened that time?     denies 10. OTHER SYMPTOMS: Do you have any other symptoms? (e.g., chest pain, difficulty breathing)       Sob with exertion  Protocols used: Leg Swelling and Edema-A-AH

## 2023-11-05 NOTE — Telephone Encounter (Signed)
 See other phone encounter.

## 2023-11-05 NOTE — ED Provider Notes (Signed)
 [facility] Provider Note    Event Date/Time   First MD Initiated Contact with Patient 11/05/23 1416     (approximate)   History   Shortness of Breath   HPI  Sarah [name] is a 73 year old female with history of T2DM, CHF presenting to the emergency department for evaluation of left leg pain and swelling.  Patient reports she first noticed symptoms on Tuesday.  Does note she has had some shortness of breath since that time.  Denies chest pain.  Denies known history of asthma, COPD.  Has been trying to elevate and wrap the leg without significant benefit.     Physical Exam   Triage Vital Signs: ED Triage Vitals  Encounter Vitals Group     BP 11/05/23 1408 (!) 152/129     Girls Systolic BP Percentile --      Girls Diastolic BP Percentile --      Boys Systolic BP Percentile --      Boys Diastolic BP Percentile --      Pulse Rate 11/05/23 1408 91     Resp 11/05/23 1408 20     Temp 11/05/23 1408 98 F (36.7 C)     Temp src --      SpO2 11/05/23 1408 96 %     Weight 11/05/23 1407 225 lb (102.1 kg)     Height 11/05/23 1407 5' 4 (1.626 m)     Head Circumference --      Peak Flow --      Pain Score 11/05/23 1407 6     Pain Loc --      Pain Education --      Exclude from Growth Chart --     Most recent vital signs: Vitals:   11/05/23 1408  BP: (!) 152/129  Pulse: 91  Resp: 20  Temp: 98 F (36.7 C)  SpO2: 96%     General: Awake, interactive  CV:  Good peripheral perfusion Resp:  Unlabored respirations, lungs clear to auscultation Abd:  Nondistended.  Neuro:  Symmetric facial movement, fluid speech Other:   Swelling of the left lower extremity compared to the contralateral side with associated erythema and warmth.  Intact distal pulses.   ED Results / Procedures / Treatments   Labs (all labs ordered are listed, but only abnormal results are displayed) Labs Reviewed  CBC - Abnormal; Notable for the following components:      Result  Value   WBC 13.4 (*)    RBC 3.83 (*)    Hemoglobin 11.0 (*)    HCT 33.7 (*)    All other components within normal limits  D-DIMER, QUANTITATIVE  BRAIN NATRIURETIC PEPTIDE  BASIC METABOLIC PANEL WITH GFR  TROPONIN I (HIGH SENSITIVITY)     EKG EKG independently reviewed and interpreted by myself demonstrates:    RADIOLOGY Imaging independently reviewed and interpreted by myself demonstrates:   Formal Radiology Read:  DG Chest 2 View Result Date: 11/05/2023 CLINICAL DATA:  Shortness of breath. EXAM: CHEST - 2 VIEW COMPARISON:  09/14/2023. FINDINGS: Low lung volume. Bilateral lung fields are clear. Bilateral costophrenic angles are clear. Stable cardio-mediastinal silhouette. No acute osseous abnormalities. The soft tissues are within normal limits. IMPRESSION: No active cardiopulmonary disease. Electronically Signed   By: Ree Molt M.D.   On: 11/05/2023 15:04    PROCEDURES:  Critical Care performed: No  Procedures   MEDICATIONS ORDERED IN ED: Medications - No data to display   IMPRESSION / MDM / ASSESSMENT  AND PLAN / ED COURSE  I reviewed the triage vital signs and the nursing notes.  Differential diagnosis includes, but is not limited to, cellulitis, DVT, PE, pneumonia, anemia  Patient's presentation is most consistent with acute presentation with potential threat to life or bodily function.  73 year old female presenting to the emergency department for evaluation of leg pain and shortness of breath.  Stable vitals on presentation.  Will obtain labs, lower extremity ultrasound to further evaluate.   Clinical Course as of 11/05/23 1527  Fri Nov 05, 2023  1527 Signed out to oncoming physician pending completion of workup and disposition. [NR]    Clinical Course User Index [NR] Levander Slate, MD     FINAL CLINICAL IMPRESSION(S) / ED DIAGNOSES   Final diagnoses:  Left leg pain  SOB (shortness of breath)     Rx / DC Orders   ED Discharge Orders     None         Note:  This document was prepared using Dragon voice recognition software and may include unintentional dictation errors.   Levander Slate, MD 11/05/23 [phone]

## 2023-11-05 NOTE — Progress Notes (Addendum)
 PHARMACY - ANTICOAGULATION CONSULT NOTE  Pharmacy Consult for heparin  drip Indication: DVT  Allergies  Allergen Reactions   Clindamycin/Lincomycin Anaphylaxis   Doxycycline  Rash   Aspirin  Other (See Comments)    Pt is unsure of reaction, but states that she is allergic to aspirin    Ciprofloxacin Rash    very bad rash   Penicillins Rash    Has patient had a PCN reaction causing immediate rash, facial/tongue/throat swelling, SOB or lightheadedness with hypotension: No Has patient had a PCN reaction causing severe rash involving mucus membranes or skin necrosis: No Has patient had a PCN reaction that required hospitalization: No Has patient had a PCN reaction occurring within the last 10 years: No If all of the above answers are NO, then may proceed with Cephalosporin use.     Patient Measurements: Height: 5' 4 (162.6 cm) Weight: 102.1 kg (225 lb) IBW/kg (Calculated) : 54.7 HEPARIN  DW (KG): 78.5  Vital Signs: Temp: 98.4 F (36.9 C) (10/17 1826) Temp Source: Oral (10/17 1826) BP: 140/66 (10/17 1826) Pulse Rate: 89 (10/17 1826)  Labs: Recent Labs    11/05/23 1514 11/05/23 1604  HGB 11.0*  --   HCT 33.7*  --   PLT 186  --   CREATININE 0.94  --   TROPONINIHS 7 6    Estimated Creatinine Clearance: 62.9 mL/min (by C-G formula based on SCr of 0.94 mg/dL).   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 Starting heparin  drip for Extensive acute appearing deep venous thrombosis throughout the entirety of the left lower extremity No anticoagulation PTA per med rec and med fill hx Baseline Hgb 11.0  Plt 186  INR 1.0 aPTT 23   Goal of Therapy:  Heparin  level 0.3-0.7 units/ml Monitor platelets by anticoagulation protocol: Yes   Plan:  Give 5000 units bolus x 1 Start heparin  infusion at 1350 units/hr Check anti-Xa level in 8 hours and daily while on heparin  Continue to monitor H&H and platelets  Allean Haas PharmD Clinical Pharmacist 11/05/2023

## 2023-11-05 NOTE — ED Notes (Signed)
 Patient is being discharged from the Urgent Care and sent to the Emergency Department via POV . Per Burnard Cork NP, patient is in need of higher level of care due to unilateral leg swelling/ shortness of breath. Patient is aware and verbalizes understanding of plan of care.  Vitals:   11/05/23 1332  BP: 121/64  Pulse: 78  Resp: (!) 21  Temp: 97.7 F (36.5 C)  SpO2: 100%

## 2023-11-05 NOTE — Telephone Encounter (Signed)
 FYI, patient currently being evaluated.

## 2023-11-05 NOTE — Telephone Encounter (Signed)
 Noted

## 2023-11-05 NOTE — ED Provider Notes (Signed)
.-----------------------------------------   3:44 PM on 11/05/2023 -----------------------------------------  Blood pressure (!) 152/129, pulse 91, temperature 98 F (36.7 C), resp. rate 20, height 5' 4 (1.626 m), weight 102.1 kg, SpO2 96%.  Assuming care from Dr. Levander.  In short, Sarah [name] is a 73 y.o. female with a chief complaint of Shortness of Breath .  Refer to the original H&P for additional details.  The current plan of care is to labs and imaging, if no DVT, to start antibiotic for cellulitis, likely DC after.  Given how extensive her DVT is, will start on IV heparin , consulted hospitalist for admission who is agreeable with this plan.  She is admitted.  Clinical Course as of 11/05/23 1929  [place] Nov 05, 2023  1527 Signed out to oncoming physician pending completion of workup and disposition. [NR]  1602 D-Dimer, Quant(!): 2.39 Elevated, will order CT PE study now. [TT]  1602 Independent review of labs, BNP and Trope are negative, electrolytes really deranged, mild leukocytosis.  D-dimer is elevated, CT PE study ordered. [TT]  1754 Call from radiology, they cannot clearly see the calf veins but says that there is a large DVT extending from her common femoral all the way to the popliteal on the left. [TT]  1815 US  Venous Img Lower Unilateral Left IMPRESSION: 1. Extensive acute appearing deep venous thrombosis throughout the entirety of the left lower extremity as above.   [TT]  1841 CT Angio Chest PE W/Cm &/Or Wo Cm 1. No pulmonary embolism.  [TT]  [id number] Reassessment patient still has the leg swelling to the left leg, it is erythematous, extends all the way up to her proximal thigh.  Given how extensive her clot is, will place her on IV heparin  and plan to have her admitted, she will likely need be seen by vascular surgery for evaluation for possible intervention.  Discussed with patient and family about imaging and lab results, they are agreeable with this plan. [TT]    Clinical  Course User Index [NR] Levander Slate, MD [TT] Waymond Lorelle Cummins, MD      Waymond Lorelle Cummins, MD 11/05/23 [phone]

## 2023-11-05 NOTE — ED Provider Notes (Signed)
 [facility]    CSN: [id number] Arrival date & time: 11/05/23  1222      History   Chief Complaint Chief Complaint  Patient presents with   Leg Swelling    HPI Sarah [name] is a 73 y.o. female.  Patient presents with left leg pain, swelling, redness, weeping since 11/02/2023.  She reports shortness of breath also.  She denies dizziness, chest pain, focal weakness.  Treatment attempted with elevation and Ace bandage.  Her medical history includes diabetes, cerebral infarct, hypertension, hyperlipidemia, prolonged QT.  The history is provided by the patient and medical records.    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)    PMS (premenstrual syndrome)     Patient Active Problem List   Diagnosis Date Noted   Cerebral infarction, remote, resolved 10/13/2023   Dizziness 10/13/2023   Type 2 diabetes mellitus with obesity 09/19/2023   Syncope 09/14/2023   (HFpEF) heart failure with preserved ejection fraction (HCC) 09/14/2023   Acute kidney injury superimposed on chronic kidney disease 09/14/2023   Hypotension 09/14/2023   Gram-negative pneumonia (HCC) 09/05/2023   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   Obesity 09/20/2022   Osteopenia of neck of right femur 10/02/2021   Cervical radiculitis 07/11/2021   Vitamin D  deficiency 02/05/2021   Adrenal insufficiency (Addison's disease) (HCC) 02/04/2021   Chronic lower back pain 02/02/2021   Insomnia due to other mental disorder 02/02/2021    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   B12 deficiency 02/01/2018   History of antineoplastic chemotherapy 10/17/2017   Prolonged Q-T interval on ECG 09/04/2017   History of malignant melanoma 01/07/2017   Recurrent major depression in partial remission 12/26/2011   Hypertension associated with diabetes (HCC) 09/17/2010   Hyperlipidemia associated with type 2 diabetes mellitus (HCC) 09/17/2010    Past Surgical History:  Procedure Laterality Date   BREAST BIOPSY Right    benign   CHOLECYSTECTOMY     COLONOSCOPY  11/08/2018   Danis-polyps   COLONOSCOPY  12/05/2019   Danis   DILATION AND CURETTAGE OF UTERUS     FOOT SURGERY Right 2018   removal of melanoma   LUMBAR FUSION     lymph node removal Right 2018   groin    POLYPECTOMY     TOTAL ABDOMINAL HYSTERECTOMY W/ BILATERAL SALPINGOOPHORECTOMY     TUBAL LIGATION      OB History   No obstetric history on file.      Home Medications    Prior to Admission medications   Medication Sig Start Date End Date Taking? Authorizing Provider  amitriptyline  (ELAVIL ) 50 MG tablet Take 1 tablet (50 mg total) by mouth at bedtime. 07/13/23   [name], NP  amLODipine  (NORVASC ) 2.5 MG tablet Take 1 tablet (2.5 mg total) by mouth daily. 04/15/23   Cannady, [name] T, NP  Blood Glucose Monitoring Suppl (ONETOUCH VERIO)  w/Device KIT Use to check blood sugar 3 times a day and document results, bring to appointments.  Goal is <130 fasting blood sugar and <180 two hours after meals. 03/17/22   Cannady, [name] T, NP  Cholecalciferol (VITAMIN D ) 50 MCG (2000 UT) CAPS Take 1 capsule by mouth at bedtime.    [provider]  Cyanocobalamin (VITAMIN B-12 PO) Take 1 tablet by mouth at bedtime.    [provider]  EPINEPHrine  (EPIPEN  2-PAK) 0.3 mg/0.3 mL IJ SOAJ injection Inject 0.3 mg into the muscle as needed for anaphylaxis. 09/03/23   Wouk, Devaughn Sayres, MD  FIBER ADULT GUMMIES PO Take 1 tablet by mouth daily as needed.     [provider]  furosemide  (LASIX ) 40 MG tablet TAKE 1 TABLET BY MOUTH EVERY DAY 03/08/23   Cannady, [name] T, NP  hydrocortisone  (CORTEF ) 5 MG tablet TAKE THREE TABLETS IN THE MORNING AND ONE TABLET IN THE AFTERNOON Patient taking differently: Take 5 mg by mouth daily. 06/07/23   [provider]  losartan  (COZAAR ) 50 MG tablet Take 50 mg by mouth daily. 10/05/23   [provider]  meclizine  (ANTIVERT ) 25 MG tablet Take 1 tablet (25 mg total) by mouth 3 (three) times daily as needed for dizziness. 10/13/23   Cannady, [name] T, NP  metFORMIN  (GLUCOPHAGE ) 1000 MG tablet Take 1 tablet (1,000 mg total) by mouth 2 (two) times daily with a meal. 04/15/23   Cannady, [name] T, NP  OneTouch UltraSoft 2 Lancets MISC USE TO CHECK BLOOD SUGAR 3 TIMES A DAY AND DOCUMENT RESULTS, BRING TO APPOINTMENTS. GOAL IS <130 FASTING BLOOD SUGAR AND <180 TWO HOURS AFTER MEALS. 03/23/23   Cannady, [name] T, NP  pantoprazole  (PROTONIX ) 20 MG tablet Take 1 tablet (20 mg total) by mouth daily. 10/13/23   Cannady, [name] T, NP  pregabalin  (LYRICA ) 150 MG capsule Take 1 capsule (150 mg total) by mouth 2 (two) times daily. 150 mg in am, 150 MG in afternoon, and 300 mg in pm 09/17/23   Abdul Fine, MD  rosuvastatin  (CRESTOR ) 10 MG tablet Take 1 tablet (10 mg total) by mouth daily. 04/15/23   Cannady, [name] T, NP  Suvorexant  (BELSOMRA ) 15 MG TABS Take 1 tablet (15 mg total) by mouth at bedtime as needed. 09/17/23   Abdul Fine, MD    Family History Family History  Problem Relation Age of Onset   Depression Mother    Anxiety disorder Mother    Bipolar disorder Mother    Diabetes Mother    Mental illness Mother    Heart disease Mother    Stroke Mother    Lung cancer Father    Diabetes Maternal Grandmother    Heart attack Maternal Grandmother    CVA Paternal Grandmother    Colon cancer Neg Hx    Liver cancer Neg Hx    Pancreatic cancer Neg Hx    Esophageal cancer Neg Hx    Stomach cancer Neg  Hx    Rectal cancer Neg Hx    Colon polyps Neg Hx    Breast cancer Neg Hx     Social History Social History   Tobacco Use   Smoking status: Never    Passive exposure: Never   Smokeless tobacco: Never  Vaping Use   Vaping status: Never Used  Substance Use Topics   Alcohol use: Not Currently   Drug use: Never     Allergies   Clindamycin/lincomycin, Doxycycline , Aspirin , Ciprofloxacin, and Penicillins   Review of Systems  Review of Systems  Respiratory:  Positive for shortness of breath. Negative for cough.   Cardiovascular:  Negative for chest pain and palpitations.  Musculoskeletal:  Positive for arthralgias, gait problem and joint swelling.  Skin:  Positive for color change and wound.     Physical Exam Triage Vital Signs ED Triage Vitals  Encounter Vitals Group     BP 11/05/23 1332 121/64     Girls Systolic BP Percentile --      Girls Diastolic BP Percentile --      Boys Systolic BP Percentile --      Boys Diastolic BP Percentile --      Pulse Rate 11/05/23 1332 78     Resp 11/05/23 1332 (!) 21     Temp 11/05/23 1332 97.7 F (36.5 C)     Temp src --      SpO2 11/05/23 1332 100 %     Weight --      Height --      Head Circumference --      Peak Flow --      Pain Score 11/05/23 1329 6     Pain Loc --      Pain Education --      Exclude from Growth Chart --    No data found.  Updated Vital Signs BP 121/64   Pulse 78   Temp 97.7 F (36.5 C)   Resp (!) 21   SpO2 100%   Visual Acuity Right Eye Distance:   Left Eye Distance:   Bilateral Distance:    Right Eye Near:   Left Eye Near:    Bilateral Near:     Physical Exam Constitutional:      General: She is not in acute distress. Cardiovascular:     Rate and Rhythm: Normal rate and regular rhythm.     Heart sounds: Normal heart sounds.  Pulmonary:     Effort: Pulmonary effort is normal. No respiratory distress.     Breath sounds: Normal breath sounds.  Musculoskeletal:        General:  Swelling and tenderness present. No deformity.     Comments: Left lower leg edematous with patchy redness and weeping clear fluid.  Skin:    Findings: Erythema present.  Neurological:     General: No focal deficit present.     Mental Status: She is alert.     Sensory: No sensory deficit.     Motor: No weakness.     Gait: Gait abnormal.     Comments: Ambulatory with cane.      UC Treatments / Results  Labs (all labs ordered are listed, but only abnormal results are displayed) Labs Reviewed - No data to display  EKG   Radiology No results found.  Procedures Procedures (including critical care time)  Medications Ordered in UC Medications - No data to display  Initial Impression / Assessment and Plan / UC Course  I have reviewed the triage vital signs and the nursing notes.  Pertinent labs & imaging results that were available during my care of the patient were reviewed by me and considered in my medical decision making (see chart for details).    Left leg swelling, shortness of breath.  Afebrile and vital signs are stable.  O2 sat 100% on room air.  Sending patient to the ED for evaluation of her symptoms, given her risk for DVT and PE.  She is agreeable to this.  She is accompanied by her son-in-law who will  drive her to [facility] ED.  Final Clinical Impressions(s) / UC Diagnoses   Final diagnoses:  Left leg swelling  Shortness of breath     Discharge Instructions      Go to the emergency department for evaluation of your left leg swelling and your shortness of breath.     ED Prescriptions   None    PDMP not reviewed this encounter.   Corlis Burnard DEL, NP 11/05/23 1355

## 2023-11-05 NOTE — H&P (Signed)
 History and Physical    Sarah [name] FMW:[id number] DOB: [birth year] DOA: 11/05/2023  Referring MD/NP/PA:   PCP: Valerio Melanie DASEN, NP   Patient coming from:  The patient is coming from home.     Chief Complaint: Left leg pain and swelling  HPI: Sarah [name] is a 73 y.o. 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  Patient states that she has left leg pain, erythema and swelling for more than 3 days, which has been progressively worsening. It involves the whole left leg from foot to upper thigh.  The pain is constant, aching, moderate, nonradiating, not aggravated or alleviated by known factors.  No recent long distant traveling.  Patient has SOB, no cough, chest pain.  No fever or chills.  Patient does not have nausea, vomiting, diarrhea or abdominal pain.  No symptoms of UTI.  No rectal bleeding or dark stool.  No recent fall or head injury.  Data reviewed independently and ED Course: pt was found to have positive D-dimer 2.239, BNP  38.3, WBC 13.4, GFR> 60, troponin 7--> 8.  Temperature normal, blood pressure 140/66, heart rate in 90s, RR 21 --> 18, oxygen saturation 100% on room air.  Chest x-ray negative.  Left lower extremity venous Doppler showed extensive DVT.  CTA negative for PE.  Patient is admitted to telemetry bed as inpatient.  Dr. Tisa of [facility] is consulted.  Lower extremity venous Doppler of left leg:  1. Extensive acute appearing deep venous thrombosis throughout the entirety of the left lower extremity as above.   EKG: I have personally reviewed.  Sinus rhythm, QTc 452, LAD, poor R wave progression   Review of Systems:   General: no fevers, chills, no body weight gain, has fatigue HEENT: no blurry vision, hearing changes or sore throat Respiratory: has dyspnea,  no coughing,  wheezing CV: no chest pain, no palpitations GI: no nausea, vomiting, abdominal pain, diarrhea, constipation GU: no dysuria, burning on urination, increased urinary frequency, hematuria  Ext: has left leg edema and pain Neuro: no unilateral weakness, numbness, or tingling, no vision change or hearing loss Skin: no rash, no skin tear. MSK: No muscle spasm, no deformity, no limitation of range of movement in spin Heme: No easy bruising.  Travel history: No recent long distant travel.   Allergy:  Allergies  Allergen Reactions   Clindamycin/Lincomycin Anaphylaxis   Doxycycline  Rash   Aspirin  Other (See Comments)    Pt is unsure of reaction, but states that she is allergic to aspirin    Ciprofloxacin Rash    very bad rash   Penicillins Rash    Has patient had a PCN reaction causing immediate rash, facial/tongue/throat swelling, SOB or lightheadedness with hypotension: No Has patient had a PCN reaction causing severe rash involving mucus membranes or skin necrosis: No Has patient had a PCN reaction that required hospitalization: No Has patient had a PCN reaction occurring within the last 10 years: No If all of the above answers are NO, then may proceed with Cephalosporin use.     Past Medical History:  Diagnosis Date   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HCC)    Adrenal insufficiency    Anxiety    B12 deficiency    Cancer (HCC) 2018-2019   melanoma   Cervical radiculitis    Constipation    Dependent edema    Depression  Diabetes mellitus    PRE,NO MEDICATIONS AS OF 03/05/21   Dysmenorrhea    H/O: suicide attempt    History of antineoplastic chemotherapy    History of chemotherapy    Hyperlipidemia    Hypertension    Insomnia    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HCC)    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)    PMS (premenstrual syndrome)     Past Surgical History:  Procedure Laterality Date   BREAST BIOPSY Right    benign   CHOLECYSTECTOMY     COLONOSCOPY  11/08/2018    Danis-polyps   COLONOSCOPY  12/05/2019   Danis   DILATION AND CURETTAGE OF UTERUS     FOOT SURGERY Right 2018   removal of melanoma   LUMBAR FUSION     lymph node removal Right 2018   groin    POLYPECTOMY     TOTAL ABDOMINAL HYSTERECTOMY W/ BILATERAL SALPINGOOPHORECTOMY     TUBAL LIGATION      Social History:  reports that she has never smoked. She has never been exposed to tobacco smoke. She has never used smokeless tobacco. She reports that she does not currently use alcohol. She reports that she does not use drugs.  Family History:  Family History  Problem Relation Age of Onset   Depression Mother    Anxiety disorder Mother    Bipolar disorder Mother    Diabetes Mother    Mental illness Mother    Heart disease Mother    Stroke Mother    Lung cancer Father    Diabetes Maternal Grandmother    Heart attack Maternal Grandmother    CVA Paternal Grandmother    Colon cancer Neg Hx    Liver cancer Neg Hx    Pancreatic cancer Neg Hx    Esophageal cancer Neg Hx    Stomach cancer Neg Hx    Rectal cancer Neg Hx    Colon polyps Neg Hx    Breast cancer Neg Hx      Prior to Admission medications   Medication Sig Start Date End Date Taking? Authorizing Provider  amitriptyline  (ELAVIL ) 50 MG tablet Take 1 tablet (50 mg total) by mouth at bedtime. 07/13/23   [name], NP  amLODipine  (NORVASC ) 2.5 MG tablet Take 1 tablet (2.5 mg total) by mouth daily. 04/15/23   Cannady, [name] T, NP  Blood Glucose Monitoring Suppl (ONETOUCH VERIO) w/Device KIT Use to check blood sugar 3 times a day and document results, bring to appointments.  Goal is <130 fasting blood sugar and <180 two hours after meals. 03/17/22   Cannady, [name] T, NP  Cholecalciferol (VITAMIN D ) 50 MCG (2000 UT) CAPS Take 1 capsule by mouth at bedtime.    [provider]  Cyanocobalamin (VITAMIN B-12 PO) Take 1 tablet by mouth at bedtime.    [provider]  EPINEPHrine  (EPIPEN  2-PAK) 0.3 mg/0.3 mL IJ SOAJ  injection Inject 0.3 mg into the muscle as needed for anaphylaxis. 09/03/23   Wouk, Devaughn Sayres, MD  FIBER ADULT GUMMIES PO Take 1 tablet by mouth daily as needed.    [provider]  furosemide  (LASIX ) 40 MG tablet TAKE 1 TABLET BY MOUTH EVERY DAY 03/08/23   Cannady, [name] T, NP  hydrocortisone  (CORTEF ) 5 MG tablet TAKE THREE TABLETS IN THE MORNING AND ONE TABLET IN THE AFTERNOON Patient taking differently: Take 5 mg by mouth daily. 06/07/23   [provider]  losartan  (COZAAR ) 50 MG tablet Take 50 mg  by mouth daily. 10/05/23   [provider]  meclizine  (ANTIVERT ) 25 MG tablet Take 1 tablet (25 mg total) by mouth 3 (three) times daily as needed for dizziness. 10/13/23   Cannady, [name] T, NP  metFORMIN  (GLUCOPHAGE ) 1000 MG tablet Take 1 tablet (1,000 mg total) by mouth 2 (two) times daily with a meal. 04/15/23   Cannady, [name] T, NP  OneTouch UltraSoft 2 Lancets MISC USE TO CHECK BLOOD SUGAR 3 TIMES A DAY AND DOCUMENT RESULTS, BRING TO APPOINTMENTS. GOAL IS <130 FASTING BLOOD SUGAR AND <180 TWO HOURS AFTER MEALS. 03/23/23   Cannady, [name] T, NP  pantoprazole  (PROTONIX ) 20 MG tablet Take 1 tablet (20 mg total) by mouth daily. 10/13/23   Cannady, [name] T, NP  pregabalin  (LYRICA ) 150 MG capsule Take 1 capsule (150 mg total) by mouth 2 (two) times daily. 150 mg in am, 150 MG in afternoon, and 300 mg in pm 09/17/23   Abdul Fine, MD  rosuvastatin  (CRESTOR ) 10 MG tablet Take 1 tablet (10 mg total) by mouth daily. 04/15/23   Cannady, [name] T, NP  Suvorexant  (BELSOMRA ) 15 MG TABS Take 1 tablet (15 mg total) by mouth at bedtime as needed. 09/17/23   Abdul Fine, MD    Physical Exam: Vitals:   11/05/23 1407 11/05/23 1408 11/05/23 1826 11/05/23 2014  BP:  (!) 152/129 (!) 140/66 (!) 171/78  Pulse:  91 89 86  Resp:  20 18 20   Temp:  98 F (36.7 C) 98.4 F (36.9 C) 98.7 F (37.1 C)  TempSrc:   Oral Oral  SpO2:  96% 100% 97%  Weight: 102.1 kg     Height: 5' 4 (1.626 m)       General: Not in acute distress HEENT:       Eyes: PERRL, EOMI, no jaundice       ENT: No discharge from the ears and nose, no pharynx injection, no tonsillar enlargement.        Neck: No JVD, no bruit, no mass felt. Heme: No neck lymph node enlargement. Cardiac: S1/S2, RRR, No murmurs, No gallops or rubs. Respiratory: No rales, wheezing, rhonchi or rubs. GI: Soft, nondistended, nontender, no rebound pain, no organomegaly, BS present. GU: No hematuria Ext: has swelling, erythema, tenderness in the entire left leg from foot to upper thigh   Musculoskeletal: No joint deformities, No joint redness or warmth, no limitation of ROM in spin. Skin: No rashes.  Neuro: Alert, oriented X3, cranial nerves II-XII grossly intact, moves all extremities normally.  Psych: Patient is not psychotic, no suicidal or hemocidal ideation.  Labs on Admission: I have personally reviewed following labs and imaging studies  CBC: Recent Labs  Lab 11/05/23 1514  WBC 13.4*  HGB 11.0*  HCT 33.7*  MCV 88.0  PLT 186   Basic Metabolic Panel: Recent Labs  Lab 11/05/23 1514  NA 135  K 3.8  CL 99  CO2 24  GLUCOSE 196*  BUN 19  CREATININE 0.94  CALCIUM  8.8*   GFR: Estimated Creatinine Clearance: 62.9 mL/min (by C-G formula based on SCr of 0.94 mg/dL). Liver Function Tests: No results for input(s): AST, ALT, ALKPHOS, BILITOT, PROT, ALBUMIN in the last 168 hours. No results for input(s): LIPASE, AMYLASE in the last 168 hours. No results for input(s): AMMONIA in the last 168 hours. Coagulation Profile: Recent Labs  Lab 11/05/23 1923  INR 1.0   Cardiac Enzymes: No results for input(s): CKTOTAL, CKMB, CKMBINDEX, TROPONINI in the last 168 hours. BNP (last 3  results) No results for input(s): PROBNP in the last 8760 hours. HbA1C: No results for input(s): HGBA1C in the last 72 hours. CBG: Recent Labs  Lab 11/05/23 2029  GLUCAP 182*   Lipid Profile: No results  for input(s): CHOL, HDL, LDLCALC, TRIG, CHOLHDL, LDLDIRECT in the last 72 hours. Thyroid  Function Tests: No results for input(s): TSH, T4TOTAL, FREET4, T3FREE, THYROIDAB in the last 72 hours. Anemia Panel: No results for input(s): VITAMINB12, FOLATE, FERRITIN, TIBC, IRON, RETICCTPCT in the last 72 hours. Urine analysis:    Component Value Date/Time   COLORURINE YELLOW (A) 09/14/2023 0950   APPEARANCEUR CLEAR (A) 09/14/2023 0950   APPEARANCEUR Clear 03/20/2022 1202   LABSPEC 1.010 09/14/2023 0950   LABSPEC 1.025 06/03/2011 0945   PHURINE 5.0 09/14/2023 0950   GLUCOSEU NEGATIVE 09/14/2023 0950   GLUCOSEU Negative 06/03/2011 0945   HGBUR NEGATIVE 09/14/2023 0950   BILIRUBINUR NEGATIVE 09/14/2023 0950   BILIRUBINUR Negative 03/20/2022 1202   BILIRUBINUR Negative 06/03/2011 0945   KETONESUR NEGATIVE 09/14/2023 0950   PROTEINUR NEGATIVE 09/14/2023 0950   UROBILINOGEN 0.2 04/23/2020 1114   UROBILINOGEN 0.2 08/23/2010 1709   NITRITE NEGATIVE 09/14/2023 0950   LEUKOCYTESUR NEGATIVE 09/14/2023 0950   LEUKOCYTESUR Negative 06/03/2011 0945   Sepsis Labs: @LABRCNTIP (procalcitonin:4,lacticidven:4) )No results found for this or any previous visit (from the past 240 hours).   Radiological Exams on Admission:   Assessment/Plan Principal Problem:   DVT (deep venous thrombosis)_left leg extensive DVT Active Problems:   Leukocytosis   (HFpEF) heart failure with preserved ejection fraction (HCC)   Adrenal insufficiency (Addison's disease) (HCC)   Hypertension associated with diabetes (HCC)   Hyperlipidemia associated with type 2 diabetes mellitus (HCC)   Type 2 diabetes mellitus in patient with obesity (HCC)   Depression with anxiety   Obesity (BMI 30-39.9)   Assessment and Plan:   DVT (deep venous thrombosis)_left leg extensive DVT: Lower extremity venous Doppler showed extensive acute appearing deep venous thrombosis throughout the entirety of the  left lower extremity. CTA negative for PE. Consulted Dr. Tisa of [facility].   -Will admit to tele bed as inpt -heparin  drip initiated -pain control: When necessary Percocet and tylenol   Leukocytosis: WBC 13.4, no fever, likely due to steroid use - Follow-up by CBC  (HFpEF) heart failure with preserved ejection fraction (HCC): 2D echo on 09/05/2023 showed EF of 55-60% with grade 2 diastolic dysfunction.  BNP normal 38.3.  CHF seem to be compensated.  No oxygen desaturation. -Continue home Lasix  40 mg daily  Depression with anxiety -Amitriptyline   Hypertension associated with diabetes (HCC) - IV hydralazine as needed - Patient is on Cozaar , Lasix , amlodipine   Hyperlipidemia associated with type 2 diabetes mellitus (HCC) -Crestor   Adrenal insufficiency (Addison's disease) (HCC): Patient is hydrocortisone . -Hold hydrocortisone  - Give stress dose Solu-Cortef  50 mg 3 times daily  Type 2 diabetes mellitus in patient with obesity [facility]): Recent A1c 6.9, well-controlled.  Patient is taking metformin . -SSI  Obesity (BMI 30-39.9): Patient has Obesity Class II, with body weight 102.1  Kg and BMI 38.62 kg/m2.  - Encourage losing weight - Exercise and healthy diet  DVT ppx: on IV Heparin      Code Status: Full code   Family Communication:     not done, no family member is at bed side.     Disposition Plan:  Anticipate discharge back to previous environment  Consults called:  Dr. Tisa of [facility] is consulted.  Admission status and Level of care:  as inpt  Dispo: The patient is from: Home              Anticipated d/c is to: Home              Anticipated d/c date is: 2 days              Patient currently is not medically stable to d/c.    Severity of Illness:  The appropriate patient status for this patient is INPATIENT. Inpatient status is judged to be reasonable and necessary in order to provide the required intensity of service to ensure the patient's safety. The patient's  presenting symptoms, physical exam findings, and initial radiographic and laboratory data in the context of their chronic comorbidities is felt to place them at high risk for further clinical deterioration. Furthermore, it is not anticipated that the patient will be medically stable for discharge from the hospital within 2 midnights of admission.   * I certify that at the point of admission it is my clinical judgment that the patient will require inpatient hospital care spanning beyond 2 midnights from the point of admission due to high intensity of service, high risk for further deterioration and high frequency of surveillance required.*       Date of Service 11/05/2023    Caleb Exon [facility]   If 7PM-7AM, please contact night-coverage [email or web address] 11/05/2023, 10:09 PM

## 2023-11-05 NOTE — ED Triage Notes (Signed)
 Patient to Urgent Care with complaints of left sided leg swelling from hip to foot. Has been wrapping it without any improvement in swelling. Skin broken/ warmth and weeping clear fluid.  Symptoms x3 days. Reports symptoms haven't worsened since Tuesday.

## 2023-11-06 DIAGNOSIS — I82412 Acute embolism and thrombosis of left femoral vein: Secondary | ICD-10-CM | POA: Diagnosis not present

## 2023-11-06 DIAGNOSIS — E271 Primary adrenocortical insufficiency: Secondary | ICD-10-CM | POA: Diagnosis not present

## 2023-11-06 DIAGNOSIS — E119 Type 2 diabetes mellitus without complications: Secondary | ICD-10-CM | POA: Diagnosis not present

## 2023-11-06 DIAGNOSIS — E66812 Obesity, class 2: Secondary | ICD-10-CM | POA: Insufficient documentation

## 2023-11-06 DIAGNOSIS — I5032 Chronic diastolic (congestive) heart failure: Secondary | ICD-10-CM | POA: Diagnosis not present

## 2023-11-06 DIAGNOSIS — I82402 Acute embolism and thrombosis of unspecified deep veins of left lower extremity: Secondary | ICD-10-CM | POA: Diagnosis not present

## 2023-11-06 DIAGNOSIS — Z794 Long term (current) use of insulin: Secondary | ICD-10-CM | POA: Diagnosis not present

## 2023-11-06 DIAGNOSIS — Z79899 Other long term (current) drug therapy: Secondary | ICD-10-CM | POA: Diagnosis not present

## 2023-11-06 LAB — CBC
HCT: 30.4 % — ABNORMAL LOW (ref 36.0–46.0)
Hemoglobin: 10.1 g/dL — ABNORMAL LOW (ref 12.0–15.0)
MCH: 28.7 pg (ref 26.0–34.0)
MCHC: 33.2 g/dL (ref 30.0–36.0)
MCV: 86.4 fL (ref 80.0–100.0)
Platelets: 183 K/uL (ref 150–400)
RBC: 3.52 MIL/uL — ABNORMAL LOW (ref 3.87–5.11)
RDW: 14.1 % (ref 11.5–15.5)
WBC: 10.3 K/uL (ref 4.0–10.5)
nRBC: 0 % (ref 0.0–0.2)

## 2023-11-06 LAB — BASIC METABOLIC PANEL WITH GFR
Anion gap: 12 (ref 5–15)
BUN: 17 mg/dL (ref 8–23)
CO2: 24 mmol/L (ref 22–32)
Calcium: 8.5 mg/dL — ABNORMAL LOW (ref 8.9–10.3)
Chloride: 100 mmol/L (ref 98–111)
Creatinine, Ser: 0.82 mg/dL (ref 0.44–1.00)
GFR, Estimated: >60 mL/min (ref >60)
Glucose, Bld: 274 mg/dL — ABNORMAL HIGH (ref 70–99)
Potassium: 3.8 mmol/L (ref 3.5–5.1)
Sodium: 136 mmol/L (ref 135–145)

## 2023-11-06 LAB — GLUCOSE, CAPILLARY
Glucose-Capillary: 227 mg/dL — ABNORMAL HIGH (ref 70–99)
Glucose-Capillary: 299 mg/dL — ABNORMAL HIGH (ref 70–99)
Glucose-Capillary: 192 mg/dL — ABNORMAL HIGH (ref 70–99)
Glucose-Capillary: 243 mg/dL — ABNORMAL HIGH (ref 70–99)

## 2023-11-06 LAB — HEPARIN LEVEL (UNFRACTIONATED)
Heparin Unfractionated: 0.22 [IU]/mL — ABNORMAL LOW (ref 0.30–0.70)
Heparin Unfractionated: 0.3 [IU]/mL (ref 0.30–0.70)
Heparin Unfractionated: 0.3 [IU]/mL (ref 0.30–0.70)

## 2023-11-06 MED ORDER — HEPARIN BOLUS VIA INFUSION
1200.0000 [IU] | Freq: Once | INTRAVENOUS | Status: AC
  Administered 2023-11-06: 1200 [IU] via INTRAVENOUS
  Filled 2023-11-06: qty 1200

## 2023-11-06 MED ORDER — MELATONIN 5 MG PO TABS
5.0000 mg | ORAL_TABLET | Freq: Every day | ORAL | Status: DC
  Administered 2023-11-06 – 2023-11-09 (×4): 5 mg via ORAL
  Filled 2023-11-06 (×4): qty 1

## 2023-11-06 NOTE — Consult Note (Signed)
 [facility] Vascular Consult Note  MRN : [id number]  Sarah [name] is a 73 y.o. (11/01/1950) female who presents with chief complaint of  Chief Complaint  Patient presents with   Shortness of Breath  .  History of Present Illness: Patient- PMH- HTN, DM, fibromyalgia, malignant melanoma of  right foot. Presents to ED with 5 day history of progressive left lower extremity swelling, redness and blistering. She attempted elevation and compression without improvement. She states she has occasional lower extremity edema. Upon evaluation found to have extensive LEFT lower extremity DVT, No PE. Denies hypercoagulable state in the past.  Current Facility-Administered Medications  Medication Dose Route Frequency Provider Last Rate Last Admin   acetaminophen  (TYLENOL ) tablet 650 mg  650 mg Oral Q6H PRN Niu, Xilin, MD       albuterol  (PROVENTIL ) (2.5 MG/3ML) 0.083% nebulizer solution 2.5 mg  2.5 mg Inhalation Q4H PRN Niu, Xilin, MD       amitriptyline  (ELAVIL ) tablet 50 mg  50 mg Oral QHS Niu, Xilin, MD   50 mg at 11/05/23 2252   amLODipine  (NORVASC ) tablet 2.5 mg  2.5 mg Oral Daily Niu, Xilin, MD       furosemide  (LASIX ) tablet 40 mg  40 mg Oral Daily Niu, Xilin, MD       heparin  ADULT infusion 100 units/mL (25000 units/250mL)  1,500 Units/hr Intravenous Continuous Dail Rankin RAMAN, RPH 15 mL/hr at 11/06/23 0545 1,500 Units/hr at 11/06/23 0545   hydrALAZINE (APRESOLINE) injection 5 mg  5 mg Intravenous Q2H PRN Niu, Xilin, MD       hydrocortisone  sodium succinate  (SOLU-CORTEF ) 100 MG injection 50 mg  50 mg Intravenous Q8H Niu, Xilin, MD   50 mg at 11/06/23 9348   insulin  aspart (novoLOG ) injection 0-5 Units  0-5 Units Subcutaneous QHS Niu, Xilin, MD       insulin  aspart (novoLOG ) injection 0-9 Units  0-9 Units Subcutaneous TID WC Niu, Xilin, MD       losartan  (COZAAR ) tablet 50 mg  50 mg Oral Daily Niu, Xilin, MD       meclizine  (ANTIVERT ) tablet 25 mg  25 mg Oral TID PRN Niu,  Xilin, MD       ondansetron  (ZOFRAN ) injection 4 mg  4 mg Intravenous Q8H PRN Niu, Xilin, MD       oxyCODONE -acetaminophen  (PERCOCET/ROXICET) 5-325 MG per tablet 1 tablet  1 tablet Oral Q4H PRN Niu, Xilin, MD   1 tablet at 11/05/23 2245   pantoprazole  (PROTONIX ) EC tablet 20 mg  20 mg Oral Daily Niu, Xilin, MD       pregabalin  (LYRICA ) capsule 150 mg  150 mg Oral BID Dail Rankin RAMAN, RPH       And   pregabalin  (LYRICA ) capsule 300 mg  300 mg Oral QHS Belue, Nathan S, RPH   300 mg at 11/05/23 2245   rosuvastatin  (CRESTOR ) tablet 10 mg  10 mg Oral QHS Niu, Xilin, MD   10 mg at 11/05/23 2245   Suvorexant  TABS 15 mg  15 mg Oral QHS PRN Niu, Xilin, MD        Past Medical History:  Diagnosis Date   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[facility])    Adrenal insufficiency    Anxiety    B12 deficiency    Cancer (HCC) 2018-2019   melanoma   Cervical radiculitis    Constipation    Dependent edema    Depression    Diabetes mellitus    PRE,NO MEDICATIONS AS OF 03/05/21  Dysmenorrhea    H/O: suicide attempt    History of antineoplastic chemotherapy    History of chemotherapy    Hyperlipidemia    Hypertension    Insomnia    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HCC)    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)    PMS (premenstrual syndrome)     Past Surgical History:  Procedure Laterality Date   BREAST BIOPSY Right    benign   CHOLECYSTECTOMY     COLONOSCOPY  11/08/2018   Danis-polyps   COLONOSCOPY  12/05/2019   Danis   DILATION AND CURETTAGE OF UTERUS     FOOT SURGERY Right 2018   removal of melanoma   LUMBAR FUSION     lymph node removal Right 2018   groin    POLYPECTOMY     TOTAL ABDOMINAL HYSTERECTOMY W/ BILATERAL SALPINGOOPHORECTOMY     TUBAL LIGATION      Social History Social History   Tobacco Use   Smoking status: Never    Passive exposure: Never   Smokeless tobacco: Never  Vaping Use   Vaping status: Never Used  Substance Use Topics   Alcohol use: Not Currently   Drug use: Never     Family History Family History  Problem Relation Age of Onset   Depression Mother    Anxiety disorder Mother    Bipolar disorder Mother    Diabetes Mother    Mental illness Mother    Heart disease Mother    Stroke Mother    Lung cancer Father    Diabetes Maternal Grandmother    Heart attack Maternal Grandmother    CVA Paternal Grandmother    Colon cancer Neg Hx    Liver cancer Neg Hx    Pancreatic cancer Neg Hx    Esophageal cancer Neg Hx    Stomach cancer Neg Hx    Rectal cancer Neg Hx    Colon polyps Neg Hx    Breast cancer Neg Hx     Allergies  Allergen Reactions   Clindamycin/Lincomycin Anaphylaxis   Doxycycline  Rash   Aspirin  Other (See Comments)    Pt is unsure of reaction, but states that she is allergic to aspirin    Ciprofloxacin Rash    very bad rash   Penicillins Rash    Has patient had a PCN reaction causing immediate rash, facial/tongue/throat swelling, SOB or lightheadedness with hypotension: No Has patient had a PCN reaction causing severe rash involving mucus membranes or skin necrosis: No Has patient had a PCN reaction that required hospitalization: No Has patient had a PCN reaction occurring within the last 10 years: No If all of the above answers are NO, then may proceed with Cephalosporin use.      REVIEW OF SYSTEMS (Negative unless checked)  Constitutional: [] Weight loss  [] Fever  [] Chills Cardiac: [] Chest pain   [] Chest pressure   [] Palpitations   [] Shortness of breath when laying flat   [] Shortness of breath at rest   [x] Shortness of breath with exertion. Vascular:  [] Pain in legs with walking   [] Pain in legs at rest   [] Pain in legs when laying flat   [] Claudication   [] Pain in feet when walking  [] Pain in feet at rest  [] Pain in feet when laying flat   [] History of DVT   [] Phlebitis   [x] Swelling in legs   [] Varicose veins   [] Non-healing ulcers Pulmonary:   [] Uses home oxygen   [] Productive cough   [] Hemoptysis   [] Wheeze  [] COPD    []   Asthma Neurologic:  [] Dizziness  [] Blackouts   [] Seizures   [] History of stroke   [] History of TIA  [] Aphasia   [] Temporary blindness   [] Dysphagia   [] Weakness or numbness in arms   [] Weakness or numbness in legs Musculoskeletal:  [] Arthritis   [] Joint swelling   [] Joint pain   [] Low back pain Hematologic:  [] Easy bruising  [] Easy bleeding   [] Hypercoagulable state   [] Anemic  [] Hepatitis Gastrointestinal:  [] Blood in stool   [] Vomiting blood  [] Gastroesophageal reflux/heartburn   [] Difficulty swallowing. Genitourinary:  [] Chronic kidney disease   [] Difficult urination  [] Frequent urination  [] Burning with urination   [] Blood in urine Skin:  [] Rashes   [] Ulcers   [] Wounds Psychological:  [x] History of anxiety   []  History of major depression.  Physical Examination  Vitals:   11/05/23 1408 11/05/23 1826 11/05/23 2014 11/06/23 0329  BP: (!) 152/129 (!) 140/66 (!) 171/78 123/63  Pulse: 91 89 86 86  Resp: 20 18 20 16   Temp: 98 F (36.7 C) 98.4 F (36.9 C) 98.7 F (37.1 C) 97.8 F (36.6 C)  TempSrc:  Oral Oral Oral  SpO2: 96% 100% 97% 96%  Weight:      Height:       Body mass index is 38.62 kg/m. Gen:  WD/WN, NAD Head: [place]/AT, No temporalis wasting. Prominent temp pulse not noted. Ear/Nose/Throat: Hearing grossly intact Eyes: Sclera non-icteric, conjunctiva clear Neck: Trachea midline.  No JVD.  Pulmonary:  Good air movement, respirations not labored, equal bilaterally.  Cardiac: RRR, normal S1, S2. Vascular:  Vessel Right Left  Radial Palpable Palpable              Aorta Not palpable N/A  Femoral Palpable Palpable          DP Palpable Palpable   Gastrointestinal: soft, non-tender/non-distended. No guarding/reflex.  Musculoskeletal: M/S 5/5 throughout.  Extremities without ischemic changes.LEFT lower extremity- 2+edema, erythematous, firm throughout however, not tense, mild tenderness, blistering on lower leg.+motor/+sensory Neurologic: Sensation grossly intact in  extremities.  Symmetrical.  Speech is fluent. Motor exam as listed above. Psychiatric: Judgment intact, Mood & affect appropriate for pt's clinical situation.     CBC Lab Results  Component Value Date   WBC 10.3 11/06/2023   HGB 10.1 (L) 11/06/2023   HCT 30.4 (L) 11/06/2023   MCV 86.4 11/06/2023   PLT 183 11/06/2023    BMET    Component Value Date/Time   NA 136 11/06/2023 0427   NA 134 10/13/2023 0905   NA 146 (H) 06/04/2011 0148   K 3.8 11/06/2023 0427   K 3.4 (L) 06/04/2011 0148   CL 100 11/06/2023 0427   CL 110 (H) 06/04/2011 0148   CO2 24 11/06/2023 0427   CO2 26 06/04/2011 0148   GLUCOSE 274 (H) 11/06/2023 0427   GLUCOSE 111 (H) 06/04/2011 0148   BUN 17 11/06/2023 0427   BUN 26 10/13/2023 0905   BUN 10 06/04/2011 0148   CREATININE 0.82 11/06/2023 0427   CREATININE 0.89 04/23/2020 1150   CALCIUM  8.5 (L) 11/06/2023 0427   CALCIUM  7.5 (L) 06/04/2011 0148   GFRNONAA >60 11/06/2023 0427   GFRNONAA 67 04/23/2020 1150   GFRAA 77 04/23/2020 1150   Estimated Creatinine Clearance: 72.2 mL/min (by C-G formula based on SCr of 0.82 mg/dL).  COAG Lab Results  Component Value Date   INR 1.0 11/05/2023   INR 1.0 09/14/2023   INR 0.9 06/03/2011    Radiology CT Angio Chest PE W/Cm &/Or Wo Cm Result  Date: 11/05/2023 EXAM: CTA CHEST 11/05/2023 06:04:06 PM TECHNIQUE: CTA of the chest was performed with and without the administration of 50 mL of intravenous contrast. Multiplanar reformatted images are provided for review. MIP images are provided for review. Automated exposure control, iterative reconstruction, and/or weight based adjustment of the mA/kV was utilized to reduce the radiation dose to as low as reasonably achievable. COMPARISON: Chest x-ray and CT 09/04/2023. CLINICAL HISTORY: Pulmonary embolism (PE) suspected, low to intermediate prob, positive D-dimer. Pt comes with sob since Tuesday and sever swelling in left leg. Pt states wheeping from leg too. Pt states more  exertion when walking. FINDINGS: PULMONARY ARTERIES: Pulmonary arteries are adequately opacified for evaluation. No acute pulmonary embolus. Main pulmonary artery is normal in caliber. MEDIASTINUM: Coronary artery and aortic atherosclerotic calcification. The heart and pericardium demonstrate no acute abnormality. There is no acute abnormality of the thoracic aorta. LYMPH NODES: No mediastinal, hilar or axillary lymphadenopathy. LUNGS AND PLEURA: The lungs are without acute process. No focal consolidation or pulmonary edema. No evidence of pleural effusion or pneumothorax. UPPER ABDOMEN: Limited images of the upper abdomen demonstrate hepatic steatosis. Otherwise, the visualized portions are unremarkable. SOFT TISSUES AND BONES: No acute bone or soft tissue abnormality. IMPRESSION: 1. No pulmonary embolism. Electronically signed by: Norman Gatlin MD 11/05/2023 06:39 PM EDT RP Workstation: [id number]   US  Venous Img Lower Unilateral Left Result Date: 11/05/2023 CLINICAL DATA:  Left lower extremity edema for 4 days EXAM: LEFT LOWER EXTREMITY VENOUS DOPPLER ULTRASOUND TECHNIQUE: Gray-scale sonography with graded compression, as well as color Doppler and duplex ultrasound were performed to evaluate the lower extremity deep venous systems from the level of the common femoral vein and including the common femoral, femoral, profunda femoral, popliteal and calf veins including the posterior tibial, peroneal and gastrocnemius veins when visible. The superficial great saphenous vein was also interrogated. Spectral Doppler was utilized to evaluate flow at rest and with distal augmentation maneuvers in the common femoral, femoral and popliteal veins. COMPARISON:  None Available. FINDINGS: Contralateral Common Femoral Vein: Respiratory phasicity is normal and symmetric with the symptomatic side. No evidence of thrombus. Normal compressibility. Common Femoral Vein: Occlusive thrombus. The vessel is noncompressible with no  Doppler flow detected. Saphenofemoral Junction: Occlusive thrombus with no Doppler flow detected. Profunda Femoral Vein: Occlusive thrombus. The vessel is noncompressible with no Doppler flow detected. Femoral Vein: Occlusive thrombus. The vessel is noncompressible with no Doppler flow detected. Popliteal Vein: Occlusive thrombus, the vessel is noncompressible with no Doppler flow detected. Calf Veins: No evidence of thrombus, though evaluation is limited due to subcutaneous edema. Superficial Great Saphenous Vein: Near occlusive thrombus is identified, with only a small segment of the saphenous vein demonstrating Doppler flow. Other Findings:  None. IMPRESSION: 1. Extensive acute appearing deep venous thrombosis throughout the entirety of the left lower extremity as above. Critical Value/emergent results were called by telephone at the time of interpretation on 11/05/2023 at 5:55 pm to provider LORELLE CHEADLE, who verbally acknowledged these results. Electronically Signed   By: Ozell Daring M.D.   On: 11/05/2023 17:59   DG Chest 2 View Result Date: 11/05/2023 CLINICAL DATA:  Shortness of breath. EXAM: CHEST - 2 VIEW COMPARISON:  09/14/2023. FINDINGS: Low lung volume. Bilateral lung fields are clear. Bilateral costophrenic angles are clear. Stable cardio-mediastinal silhouette. No acute osseous abnormalities. The soft tissues are within normal limits. IMPRESSION: No active cardiopulmonary disease. Electronically Signed   By: Ree Molt M.D.   On: 11/05/2023 15:04  Assessment/Plan 1. Extensive left lower extremity DVT Heparin  gtt Will monitor Left lower extremity thrombectomy Monday NPO after MN Sunday Discussed at length with patient   Tisa Curry LABOR, MD  11/06/2023 9:18 AM    This note was created with Dragon medical transcription system.  Any error is purely unintentional

## 2023-11-06 NOTE — Progress Notes (Signed)
 Per patient Dr. Laurita said I can get up and walk around, sit in the chair, or go to the bathroom anytime I want to.

## 2023-11-06 NOTE — Hospital Course (Signed)
 Sarah [name] is a 73 y.o. 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  Left lower extremity duplex ultrasound showed extensive DVT, CT angiogram of the chest ruled out PE. Thrombectomy performed on 10/20, continued on heparin  drip.

## 2023-11-06 NOTE — Progress Notes (Signed)
 PHARMACY - ANTICOAGULATION CONSULT NOTE  Pharmacy Consult for heparin  drip Indication: DVT  Allergies  Allergen Reactions   Clindamycin/Lincomycin Anaphylaxis   Doxycycline  Rash   Aspirin  Other (See Comments)    Pt is unsure of reaction, but states that she is allergic to aspirin    Ciprofloxacin Rash    very bad rash   Penicillins Rash    Has patient had a PCN reaction causing immediate rash, facial/tongue/throat swelling, SOB or lightheadedness with hypotension: No Has patient had a PCN reaction causing severe rash involving mucus membranes or skin necrosis: No Has patient had a PCN reaction that required hospitalization: No Has patient had a PCN reaction occurring within the last 10 years: No If all of the above answers are NO, then may proceed with Cephalosporin use.     Patient Measurements: Height: 5' 4 (162.6 cm) Weight: 102.1 kg (225 lb) IBW/kg (Calculated) : 54.7 HEPARIN  DW (KG): 78.5  Vital Signs: Temp: 97.8 F (36.6 C) (10/18 0329) Temp Source: Oral (10/18 0329) BP: 123/63 (10/18 0329) Pulse Rate: 86 (10/18 0329)  Labs: Recent Labs    11/05/23 1514 11/05/23 1604 11/05/23 1923 11/06/23 0427  HGB 11.0*  --   --  10.1*  HCT 33.7*  --   --  30.4*  PLT 186  --   --  183  APTT  --   --  23*  --   LABPROT  --   --  13.9  --   INR  --   --  1.0  --   HEPARINUNFRC  --   --   --  0.22*  CREATININE 0.94  --   --  0.82  TROPONINIHS 7 6  --   --     Estimated Creatinine Clearance: 72.2 mL/min (by C-G formula based on SCr of 0.82 mg/dL).   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   11/05/2023   SOLU-CORTEF  100 MG injection Inject 100 mg into the muscle once.   Past Week   amitriptyline  (ELAVIL ) 50 MG tablet Take 1 tablet (50 mg total) by mouth at bedtime. 90 tablet 1    amLODipine  (NORVASC ) 2.5 MG tablet Take 1 tablet (2.5 mg total) by mouth daily. 90 tablet 3    Blood Glucose Monitoring Suppl (ONETOUCH VERIO) w/Device KIT Use to check blood sugar 3 times a day and document results, bring to appointments.  Goal is <130 fasting blood sugar and <180 two hours after meals. 1 kit 0    Cholecalciferol (VITAMIN D ) 50 MCG (2000 UT) CAPS Take 1 capsule by mouth at bedtime.      Cyanocobalamin (VITAMIN B-12 PO) Take  1 tablet by mouth at bedtime.      EPINEPHrine  (EPIPEN  2-PAK) 0.3 mg/0.3 mL IJ SOAJ injection Inject 0.3 mg into the muscle as needed for anaphylaxis. 2 each 0    FIBER ADULT GUMMIES PO Take 1 tablet by mouth daily as needed.      furosemide  (LASIX ) 40 MG tablet TAKE 1 TABLET BY MOUTH EVERY DAY 90 tablet 4    hydrocortisone  (CORTEF ) 5 MG tablet TAKE THREE TABLETS IN THE MORNING AND ONE TABLET IN THE AFTERNOON (Patient taking differently: Take 5 mg by mouth daily.)      meclizine  (ANTIVERT ) 25 MG tablet Take 1 tablet (25 mg total) by mouth 3 (three) times daily as needed for dizziness. 60 tablet 2    metFORMIN  (GLUCOPHAGE ) 1000 MG tablet Take 1 tablet (1,000 mg total) by mouth 2 (two) times daily with a meal. 180 tablet 4    OneTouch UltraSoft 2 Lancets MISC USE TO CHECK BLOOD SUGAR 3 TIMES A DAY AND DOCUMENT RESULTS, BRING TO APPOINTMENTS. GOAL IS <130 FASTING BLOOD SUGAR AND <180 TWO HOURS AFTER MEALS. 100 each 2    pantoprazole  (PROTONIX ) 20 MG tablet  Take 1 tablet (20 mg total) by mouth daily. 90 tablet 1    pregabalin  (LYRICA ) 150 MG capsule Take 1 capsule (150 mg total) by mouth 2 (two) times daily. 150 mg in am, 150 MG in afternoon, and 300 mg in pm 60 capsule 0    rosuvastatin  (CRESTOR ) 10 MG tablet Take 1 tablet (10 mg total) by mouth daily. 90 tablet 3    Suvorexant  (BELSOMRA ) 15 MG TABS Take 1 tablet (15 mg total) by mouth at bedtime as needed. 30 tablet 0    Scheduled:   amitriptyline   50 mg Oral QHS   amLODipine   2.5 mg Oral Daily   furosemide   40 mg Oral Daily   hydrocortisone  sod succinate (SOLU-CORTEF ) inj  50 mg Intravenous Q8H   insulin  aspart  0-5 Units Subcutaneous QHS   insulin  aspart  0-9 Units Subcutaneous TID WC   losartan   50 mg Oral Daily   pantoprazole   20 mg Oral Daily   pregabalin   150 mg Oral BID   And   pregabalin   300 mg Oral QHS   rosuvastatin   10 mg Oral QHS   Infusions:   heparin  1,350 Units/hr (11/05/23 1925)    Assessment: 73 year old female with history of T2DM, CHF presenting to the emergency department for evaluation of left leg pain and swelling. Starting heparin  drip for Extensive acute appearing deep venous thrombosis throughout the entirety of the left lower extremity No anticoagulation PTA per med rec and med fill hx Baseline Hgb 11.0  Plt 186  INR 1.0 aPTT 23   Goal of Therapy:  Heparin  level 0.3-0.7 units/ml Monitor platelets by anticoagulation protocol: Yes  10/18 0427 HL 0.22, subtherapeutic   Plan:  Bolus 1200 units x 1 Increase heparin  infusion to 1500 unit/hr Recheck HL in 8 hr after rate change. CBC daily while on heparin   Rankin CANDIE Dills, PharmD, [facility] 11/06/2023 5:01 AM

## 2023-11-06 NOTE — H&P (View-Only) (Signed)
 [facility] Vascular Consult Note  MRN : [id number]  Sarah [name] is a 73 y.o. (11/01/1950) female who presents with chief complaint of  Chief Complaint  Patient presents with   Shortness of Breath  .  History of Present Illness: Patient- PMH- HTN, DM, fibromyalgia, malignant melanoma of  right foot. Presents to ED with 5 day history of progressive left lower extremity swelling, redness and blistering. She attempted elevation and compression without improvement. She states she has occasional lower extremity edema. Upon evaluation found to have extensive LEFT lower extremity DVT, No PE. Denies hypercoagulable state in the past.  Current Facility-Administered Medications  Medication Dose Route Frequency Provider Last Rate Last Admin   acetaminophen  (TYLENOL ) tablet 650 mg  650 mg Oral Q6H PRN Niu, Xilin, MD       albuterol  (PROVENTIL ) (2.5 MG/3ML) 0.083% nebulizer solution 2.5 mg  2.5 mg Inhalation Q4H PRN Niu, Xilin, MD       amitriptyline  (ELAVIL ) tablet 50 mg  50 mg Oral QHS Niu, Xilin, MD   50 mg at 11/05/23 2252   amLODipine  (NORVASC ) tablet 2.5 mg  2.5 mg Oral Daily Niu, Xilin, MD       furosemide  (LASIX ) tablet 40 mg  40 mg Oral Daily Niu, Xilin, MD       heparin  ADULT infusion 100 units/mL (25000 units/250mL)  1,500 Units/hr Intravenous Continuous Dail Rankin RAMAN, RPH 15 mL/hr at 11/06/23 0545 1,500 Units/hr at 11/06/23 0545   hydrALAZINE (APRESOLINE) injection 5 mg  5 mg Intravenous Q2H PRN Niu, Xilin, MD       hydrocortisone  sodium succinate  (SOLU-CORTEF ) 100 MG injection 50 mg  50 mg Intravenous Q8H Niu, Xilin, MD   50 mg at 11/06/23 9348   insulin  aspart (novoLOG ) injection 0-5 Units  0-5 Units Subcutaneous QHS Niu, Xilin, MD       insulin  aspart (novoLOG ) injection 0-9 Units  0-9 Units Subcutaneous TID WC Niu, Xilin, MD       losartan  (COZAAR ) tablet 50 mg  50 mg Oral Daily Niu, Xilin, MD       meclizine  (ANTIVERT ) tablet 25 mg  25 mg Oral TID PRN Niu,  Xilin, MD       ondansetron  (ZOFRAN ) injection 4 mg  4 mg Intravenous Q8H PRN Niu, Xilin, MD       oxyCODONE -acetaminophen  (PERCOCET/ROXICET) 5-325 MG per tablet 1 tablet  1 tablet Oral Q4H PRN Niu, Xilin, MD   1 tablet at 11/05/23 2245   pantoprazole  (PROTONIX ) EC tablet 20 mg  20 mg Oral Daily Niu, Xilin, MD       pregabalin  (LYRICA ) capsule 150 mg  150 mg Oral BID Dail Rankin RAMAN, RPH       And   pregabalin  (LYRICA ) capsule 300 mg  300 mg Oral QHS Belue, Nathan S, RPH   300 mg at 11/05/23 2245   rosuvastatin  (CRESTOR ) tablet 10 mg  10 mg Oral QHS Niu, Xilin, MD   10 mg at 11/05/23 2245   Suvorexant  TABS 15 mg  15 mg Oral QHS PRN Niu, Xilin, MD        Past Medical History:  Diagnosis Date   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[facility])    Adrenal insufficiency    Anxiety    B12 deficiency    Cancer (HCC) 2018-2019   melanoma   Cervical radiculitis    Constipation    Dependent edema    Depression    Diabetes mellitus    PRE,NO MEDICATIONS AS OF 03/05/21  Dysmenorrhea    H/O: suicide attempt    History of antineoplastic chemotherapy    History of chemotherapy    Hyperlipidemia    Hypertension    Insomnia    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HCC)    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)    PMS (premenstrual syndrome)     Past Surgical History:  Procedure Laterality Date   BREAST BIOPSY Right    benign   CHOLECYSTECTOMY     COLONOSCOPY  11/08/2018   Danis-polyps   COLONOSCOPY  12/05/2019   Danis   DILATION AND CURETTAGE OF UTERUS     FOOT SURGERY Right 2018   removal of melanoma   LUMBAR FUSION     lymph node removal Right 2018   groin    POLYPECTOMY     TOTAL ABDOMINAL HYSTERECTOMY W/ BILATERAL SALPINGOOPHORECTOMY     TUBAL LIGATION      Social History Social History   Tobacco Use   Smoking status: Never    Passive exposure: Never   Smokeless tobacco: Never  Vaping Use   Vaping status: Never Used  Substance Use Topics   Alcohol use: Not Currently   Drug use: Never     Family History Family History  Problem Relation Age of Onset   Depression Mother    Anxiety disorder Mother    Bipolar disorder Mother    Diabetes Mother    Mental illness Mother    Heart disease Mother    Stroke Mother    Lung cancer Father    Diabetes Maternal Grandmother    Heart attack Maternal Grandmother    CVA Paternal Grandmother    Colon cancer Neg Hx    Liver cancer Neg Hx    Pancreatic cancer Neg Hx    Esophageal cancer Neg Hx    Stomach cancer Neg Hx    Rectal cancer Neg Hx    Colon polyps Neg Hx    Breast cancer Neg Hx     Allergies  Allergen Reactions   Clindamycin/Lincomycin Anaphylaxis   Doxycycline  Rash   Aspirin  Other (See Comments)    Pt is unsure of reaction, but states that she is allergic to aspirin    Ciprofloxacin Rash    very bad rash   Penicillins Rash    Has patient had a PCN reaction causing immediate rash, facial/tongue/throat swelling, SOB or lightheadedness with hypotension: No Has patient had a PCN reaction causing severe rash involving mucus membranes or skin necrosis: No Has patient had a PCN reaction that required hospitalization: No Has patient had a PCN reaction occurring within the last 10 years: No If all of the above answers are NO, then may proceed with Cephalosporin use.      REVIEW OF SYSTEMS (Negative unless checked)  Constitutional: [] Weight loss  [] Fever  [] Chills Cardiac: [] Chest pain   [] Chest pressure   [] Palpitations   [] Shortness of breath when laying flat   [] Shortness of breath at rest   [x] Shortness of breath with exertion. Vascular:  [] Pain in legs with walking   [] Pain in legs at rest   [] Pain in legs when laying flat   [] Claudication   [] Pain in feet when walking  [] Pain in feet at rest  [] Pain in feet when laying flat   [] History of DVT   [] Phlebitis   [x] Swelling in legs   [] Varicose veins   [] Non-healing ulcers Pulmonary:   [] Uses home oxygen   [] Productive cough   [] Hemoptysis   [] Wheeze  [] COPD    []   Asthma Neurologic:  [] Dizziness  [] Blackouts   [] Seizures   [] History of stroke   [] History of TIA  [] Aphasia   [] Temporary blindness   [] Dysphagia   [] Weakness or numbness in arms   [] Weakness or numbness in legs Musculoskeletal:  [] Arthritis   [] Joint swelling   [] Joint pain   [] Low back pain Hematologic:  [] Easy bruising  [] Easy bleeding   [] Hypercoagulable state   [] Anemic  [] Hepatitis Gastrointestinal:  [] Blood in stool   [] Vomiting blood  [] Gastroesophageal reflux/heartburn   [] Difficulty swallowing. Genitourinary:  [] Chronic kidney disease   [] Difficult urination  [] Frequent urination  [] Burning with urination   [] Blood in urine Skin:  [] Rashes   [] Ulcers   [] Wounds Psychological:  [x] History of anxiety   []  History of major depression.  Physical Examination  Vitals:   11/05/23 1408 11/05/23 1826 11/05/23 2014 11/06/23 0329  BP: (!) 152/129 (!) 140/66 (!) 171/78 123/63  Pulse: 91 89 86 86  Resp: 20 18 20 16   Temp: 98 F (36.7 C) 98.4 F (36.9 C) 98.7 F (37.1 C) 97.8 F (36.6 C)  TempSrc:  Oral Oral Oral  SpO2: 96% 100% 97% 96%  Weight:      Height:       Body mass index is 38.62 kg/m. Gen:  WD/WN, NAD Head: [place]/AT, No temporalis wasting. Prominent temp pulse not noted. Ear/Nose/Throat: Hearing grossly intact Eyes: Sclera non-icteric, conjunctiva clear Neck: Trachea midline.  No JVD.  Pulmonary:  Good air movement, respirations not labored, equal bilaterally.  Cardiac: RRR, normal S1, S2. Vascular:  Vessel Right Left  Radial Palpable Palpable              Aorta Not palpable N/A  Femoral Palpable Palpable          DP Palpable Palpable   Gastrointestinal: soft, non-tender/non-distended. No guarding/reflex.  Musculoskeletal: M/S 5/5 throughout.  Extremities without ischemic changes.LEFT lower extremity- 2+edema, erythematous, firm throughout however, not tense, mild tenderness, blistering on lower leg.+motor/+sensory Neurologic: Sensation grossly intact in  extremities.  Symmetrical.  Speech is fluent. Motor exam as listed above. Psychiatric: Judgment intact, Mood & affect appropriate for pt's clinical situation.     CBC Lab Results  Component Value Date   WBC 10.3 11/06/2023   HGB 10.1 (L) 11/06/2023   HCT 30.4 (L) 11/06/2023   MCV 86.4 11/06/2023   PLT 183 11/06/2023    BMET    Component Value Date/Time   NA 136 11/06/2023 0427   NA 134 10/13/2023 0905   NA 146 (H) 06/04/2011 0148   K 3.8 11/06/2023 0427   K 3.4 (L) 06/04/2011 0148   CL 100 11/06/2023 0427   CL 110 (H) 06/04/2011 0148   CO2 24 11/06/2023 0427   CO2 26 06/04/2011 0148   GLUCOSE 274 (H) 11/06/2023 0427   GLUCOSE 111 (H) 06/04/2011 0148   BUN 17 11/06/2023 0427   BUN 26 10/13/2023 0905   BUN 10 06/04/2011 0148   CREATININE 0.82 11/06/2023 0427   CREATININE 0.89 04/23/2020 1150   CALCIUM  8.5 (L) 11/06/2023 0427   CALCIUM  7.5 (L) 06/04/2011 0148   GFRNONAA >60 11/06/2023 0427   GFRNONAA 67 04/23/2020 1150   GFRAA 77 04/23/2020 1150   Estimated Creatinine Clearance: 72.2 mL/min (by C-G formula based on SCr of 0.82 mg/dL).  COAG Lab Results  Component Value Date   INR 1.0 11/05/2023   INR 1.0 09/14/2023   INR 0.9 06/03/2011    Radiology CT Angio Chest PE W/Cm &/Or Wo Cm Result  Date: 11/05/2023 EXAM: CTA CHEST 11/05/2023 06:04:06 PM TECHNIQUE: CTA of the chest was performed with and without the administration of 50 mL of intravenous contrast. Multiplanar reformatted images are provided for review. MIP images are provided for review. Automated exposure control, iterative reconstruction, and/or weight based adjustment of the mA/kV was utilized to reduce the radiation dose to as low as reasonably achievable. COMPARISON: Chest x-ray and CT 09/04/2023. CLINICAL HISTORY: Pulmonary embolism (PE) suspected, low to intermediate prob, positive D-dimer. Pt comes with sob since Tuesday and sever swelling in left leg. Pt states wheeping from leg too. Pt states more  exertion when walking. FINDINGS: PULMONARY ARTERIES: Pulmonary arteries are adequately opacified for evaluation. No acute pulmonary embolus. Main pulmonary artery is normal in caliber. MEDIASTINUM: Coronary artery and aortic atherosclerotic calcification. The heart and pericardium demonstrate no acute abnormality. There is no acute abnormality of the thoracic aorta. LYMPH NODES: No mediastinal, hilar or axillary lymphadenopathy. LUNGS AND PLEURA: The lungs are without acute process. No focal consolidation or pulmonary edema. No evidence of pleural effusion or pneumothorax. UPPER ABDOMEN: Limited images of the upper abdomen demonstrate hepatic steatosis. Otherwise, the visualized portions are unremarkable. SOFT TISSUES AND BONES: No acute bone or soft tissue abnormality. IMPRESSION: 1. No pulmonary embolism. Electronically signed by: Norman Gatlin MD 11/05/2023 06:39 PM EDT RP Workstation: [id number]   US  Venous Img Lower Unilateral Left Result Date: 11/05/2023 CLINICAL DATA:  Left lower extremity edema for 4 days EXAM: LEFT LOWER EXTREMITY VENOUS DOPPLER ULTRASOUND TECHNIQUE: Gray-scale sonography with graded compression, as well as color Doppler and duplex ultrasound were performed to evaluate the lower extremity deep venous systems from the level of the common femoral vein and including the common femoral, femoral, profunda femoral, popliteal and calf veins including the posterior tibial, peroneal and gastrocnemius veins when visible. The superficial great saphenous vein was also interrogated. Spectral Doppler was utilized to evaluate flow at rest and with distal augmentation maneuvers in the common femoral, femoral and popliteal veins. COMPARISON:  None Available. FINDINGS: Contralateral Common Femoral Vein: Respiratory phasicity is normal and symmetric with the symptomatic side. No evidence of thrombus. Normal compressibility. Common Femoral Vein: Occlusive thrombus. The vessel is noncompressible with no  Doppler flow detected. Saphenofemoral Junction: Occlusive thrombus with no Doppler flow detected. Profunda Femoral Vein: Occlusive thrombus. The vessel is noncompressible with no Doppler flow detected. Femoral Vein: Occlusive thrombus. The vessel is noncompressible with no Doppler flow detected. Popliteal Vein: Occlusive thrombus, the vessel is noncompressible with no Doppler flow detected. Calf Veins: No evidence of thrombus, though evaluation is limited due to subcutaneous edema. Superficial Great Saphenous Vein: Near occlusive thrombus is identified, with only a small segment of the saphenous vein demonstrating Doppler flow. Other Findings:  None. IMPRESSION: 1. Extensive acute appearing deep venous thrombosis throughout the entirety of the left lower extremity as above. Critical Value/emergent results were called by telephone at the time of interpretation on 11/05/2023 at 5:55 pm to provider LORELLE CHEADLE, who verbally acknowledged these results. Electronically Signed   By: Ozell Daring M.D.   On: 11/05/2023 17:59   DG Chest 2 View Result Date: 11/05/2023 CLINICAL DATA:  Shortness of breath. EXAM: CHEST - 2 VIEW COMPARISON:  09/14/2023. FINDINGS: Low lung volume. Bilateral lung fields are clear. Bilateral costophrenic angles are clear. Stable cardio-mediastinal silhouette. No acute osseous abnormalities. The soft tissues are within normal limits. IMPRESSION: No active cardiopulmonary disease. Electronically Signed   By: Ree Molt M.D.   On: 11/05/2023 15:04  Assessment/Plan 1. Extensive left lower extremity DVT Heparin  gtt Will monitor Left lower extremity thrombectomy Monday NPO after MN Sunday Discussed at length with patient   Tisa Curry LABOR, MD  11/06/2023 9:18 AM    This note was created with Dragon medical transcription system.  Any error is purely unintentional

## 2023-11-06 NOTE — Progress Notes (Signed)
 PHARMACY - ANTICOAGULATION CONSULT NOTE  Pharmacy Consult for heparin  drip Indication: DVT  Allergies  Allergen Reactions   Clindamycin/Lincomycin Anaphylaxis   Doxycycline  Rash   Aspirin  Other (See Comments)    Pt is unsure of reaction, but states that she is allergic to aspirin    Ciprofloxacin Rash    very bad rash   Penicillins Rash    Has patient had a PCN reaction causing immediate rash, facial/tongue/throat swelling, SOB or lightheadedness with hypotension: No Has patient had a PCN reaction causing severe rash involving mucus membranes or skin necrosis: No Has patient had a PCN reaction that required hospitalization: No Has patient had a PCN reaction occurring within the last 10 years: No If all of the above answers are NO, then may proceed with Cephalosporin use.     Patient Measurements: Height: 5' 4 (162.6 cm) Weight: 102.1 kg (225 lb) IBW/kg (Calculated) : 54.7 HEPARIN  DW (KG): 78.5  Vital Signs: Temp: 98.4 F (36.9 C) (10/18 2003) Temp Source: Oral (10/18 2003) BP: 112/87 (10/18 2003) Pulse Rate: 83 (10/18 2003)  Labs: Recent Labs    11/05/23 1514 11/05/23 1604 11/05/23 1923 11/06/23 0427 11/06/23 1309 11/06/23 2137  HGB 11.0*  --   --  10.1*  --   --   HCT 33.7*  --   --  30.4*  --   --   PLT 186  --   --  183  --   --   APTT  --   --  23*  --   --   --   LABPROT  --   --  13.9  --   --   --   INR  --   --  1.0  --   --   --   HEPARINUNFRC  --   --   --  0.22* 0.30 0.30  CREATININE 0.94  --   --  0.82  --   --   TROPONINIHS 7 6  --   --   --   --     Estimated Creatinine Clearance: 72.2 mL/min (by C-G formula based on SCr of 0.82 mg/dL).   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   11/05/2023   SOLU-CORTEF  100 MG injection Inject 100 mg into the muscle once.   Past Week   amitriptyline  (ELAVIL ) 50 MG tablet Take 1 tablet (50 mg total) by mouth at bedtime. 90 tablet 1    amLODipine  (NORVASC ) 2.5 MG tablet Take 1 tablet (2.5 mg total) by mouth daily. 90 tablet 3    Blood Glucose Monitoring Suppl (ONETOUCH VERIO) w/Device KIT Use to check blood sugar  3 times a day and document results, bring to appointments.  Goal is <130 fasting blood sugar and <180 two hours after meals. 1 kit 0    Cholecalciferol (VITAMIN D ) 50 MCG (2000 UT) CAPS Take 1 capsule by mouth at bedtime.      Cyanocobalamin (VITAMIN B-12 PO) Take 1 tablet by mouth at bedtime.      EPINEPHrine  (EPIPEN  2-PAK) 0.3 mg/0.3 mL IJ SOAJ injection Inject 0.3 mg into the muscle as needed for anaphylaxis. 2 each 0    FIBER ADULT GUMMIES PO Take 1 tablet by mouth daily as needed.      furosemide  (LASIX ) 40 MG tablet TAKE 1 TABLET BY MOUTH EVERY DAY 90 tablet 4    hydrocortisone  (CORTEF ) 5 MG tablet TAKE THREE TABLETS IN THE MORNING AND ONE TABLET IN THE AFTERNOON (Patient taking differently: Take 5 mg by mouth daily.)      meclizine  (ANTIVERT ) 25 MG tablet Take 1 tablet (25 mg total) by mouth 3 (three) times daily as needed for dizziness. 60 tablet 2    metFORMIN  (GLUCOPHAGE ) 1000 MG tablet Take 1 tablet (1,000 mg total) by mouth 2 (two) times daily with a meal. 180 tablet 4    OneTouch UltraSoft 2 Lancets MISC USE TO CHECK BLOOD SUGAR 3 TIMES A DAY AND DOCUMENT RESULTS, BRING TO  APPOINTMENTS. GOAL IS <130 FASTING BLOOD SUGAR AND <180 TWO HOURS AFTER MEALS. 100 each 2    pantoprazole  (PROTONIX ) 20 MG tablet Take 1 tablet (20 mg total) by mouth daily. 90 tablet 1    pregabalin  (LYRICA ) 150 MG capsule Take 1 capsule (150 mg total) by mouth 2 (two) times daily. 150 mg in am, 150 MG in afternoon, and 300 mg in pm 60 capsule 0    rosuvastatin  (CRESTOR ) 10 MG tablet Take 1 tablet (10 mg total) by mouth daily. 90 tablet 3    Suvorexant  (BELSOMRA ) 15 MG TABS Take 1 tablet (15 mg total) by mouth at bedtime as needed. 30 tablet 0    Scheduled:   amitriptyline   50 mg Oral QHS   amLODipine   2.5 mg Oral Daily   furosemide   40 mg Oral Daily   hydrocortisone  sod succinate (SOLU-CORTEF ) inj  50 mg Intravenous Q8H   insulin  aspart  0-5 Units Subcutaneous QHS   insulin  aspart  0-9 Units Subcutaneous TID WC   losartan   50 mg Oral Daily   melatonin  5 mg Oral QHS   pantoprazole   20 mg Oral Daily   pregabalin   150 mg Oral BID   And   pregabalin   300 mg Oral QHS   rosuvastatin   10 mg Oral QHS   Infusions:   heparin  1,550 Units/hr (11/06/23 1654)    Assessment: 73 year old female with history of T2DM, CHF presenting to the emergency department for evaluation of left leg pain and swelling. Starting heparin  drip for Extensive acute appearing deep venous thrombosis throughout the entirety of the left lower extremity No anticoagulation PTA per med rec and med fill hx Baseline Hgb 11.0  Plt 186  INR 1.0 aPTT 23   Goal of Therapy:  Heparin  level 0.3-0.7 units/ml Monitor platelets by anticoagulation protocol: Yes  10/18 0427 HL 0.22, subtherapeutic 10/18 1309 HL 0.30, therapeutic x1, barely 10/18 2206 HL 0.30, therapeutic x 2   Plan:  Will continue heparin  infusion at 1550 unit/hr Recheck heparin  level w/ AM labs CBC daily while on heparin   Rankin CANDIE Dills, PharmD, [facility] 11/06/2023 10:44 PM

## 2023-11-06 NOTE — Progress Notes (Signed)
  Progress Note   Patient: Sarah [name] FMW:[id number] DOB: [birth year] DOA: 11/05/2023     1 DOS: the patient was seen and examined on 11/06/2023   Brief hospital course: Neal Oshea is a 73 y.o. 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  Left lower extremity duplex ultrasound showed extensive DVT, CT angiogram of the chest ruled out PE. Patient was seen by vascular surgery, scheduled for thrombectomy on Monday.  She was placed on heparin  drip.   Principal Problem:   DVT (deep venous thrombosis)_left leg extensive DVT Active Problems:   Leukocytosis   (HFpEF) heart failure with preserved ejection fraction (HCC)   Adrenal insufficiency (Addison's disease) (HCC)   Hypertension associated with diabetes (HCC)   Hyperlipidemia associated with type 2 diabetes mellitus (HCC)   Type 2 diabetes mellitus in patient with obesity (HCC)   Depression with anxiety   Obesity (BMI 30-39.9)   Class 2 obesity   Assessment and Plan: DVT (deep venous thrombosis)_left leg extensive DVT: Lower extremity venous Doppler showed extensive acute appearing deep venous thrombosis throughout the entirety of the left lower extremity. CTA negative for PE.  Continue heparin  drip, scheduled for thrombectomy on Monday.   Chronic (HFpEF) heart failure with preserved ejection fraction (HCC): 2D echo on 09/05/2023 showed EF of 55-60% with grade 2 diastolic dysfunction.  BNP normal 38.3.   No exacerbation.   Depression with anxiety -Amitriptyline    Hypertension associated with diabetes (HCC) Continue home medicines.   Hyperlipidemia associated with type 2 diabetes mellitus (HCC) -Crestor    Adrenal insufficiency (Addison's disease) (HCC): Patient is hydrocortisone . -Hold hydrocortisone  - Give stress dose Solu-Cortef  50 mg 3 times  daily   Type 2 diabetes mellitus in patient with obesity [facility]): Recent A1c 6.9, continue sliding scale insulin    Obesity (BMI 30-39.9): Patient has Obesity Class II, with body weight 102.1  Kg and BMI 38.62 kg/m2.  Diet and exercise         Subjective:  Still complaining of left leg pain and swelling.  No short of breath no cough  Physical Exam: Vitals:   11/05/23 1826 11/05/23 2014 11/06/23 0329 11/06/23 0930  BP: (!) 140/66 (!) 171/78 123/63 127/66  Pulse: 89 86 86 85  Resp: 18 20 16 16   Temp: 98.4 F (36.9 C) 98.7 F (37.1 C) 97.8 F (36.6 C) 98.3 F (36.8 C)  TempSrc: Oral Oral Oral   SpO2: 100% 97% 96% 94%  Weight:      Height:       General exam: Appears calm and comfortable  Respiratory system: Clear to auscultation. Respiratory effort normal. Cardiovascular system: S1 & S2 heard, RRR. No JVD, murmurs, rubs, gallops or clicks. Gastrointestinal system: Abdomen is nondistended, soft and nontender. No organomegaly or masses felt. Normal bowel sounds heard. Central nervous system: Alert and oriented. No focal neurological deficits. Extremities: 3+ leg edema on the left Skin: No rashes, lesions or ulcers Psychiatry: Judgement and insight appear normal. Mood & affect appropriate.    Data Reviewed:  Reviewed CT scan, ultrasound and lab results.  Family Communication: None  Disposition: Status is: Inpatient Remains inpatient appropriate because: Severity of disease, IV treatment.  Inpatient procedure.     Time spent: 35 minutes  Author: Murvin Mana, MD 11/06/2023 11:36 AM  For on call review [email or web address].

## 2023-11-06 NOTE — Progress Notes (Signed)
 PHARMACY - ANTICOAGULATION CONSULT NOTE  Pharmacy Consult for heparin  drip Indication: DVT  Allergies  Allergen Reactions   Clindamycin/Lincomycin Anaphylaxis   Doxycycline  Rash   Aspirin  Other (See Comments)    Pt is unsure of reaction, but states that she is allergic to aspirin    Ciprofloxacin Rash    very bad rash   Penicillins Rash    Has patient had a PCN reaction causing immediate rash, facial/tongue/throat swelling, SOB or lightheadedness with hypotension: No Has patient had a PCN reaction causing severe rash involving mucus membranes or skin necrosis: No Has patient had a PCN reaction that required hospitalization: No Has patient had a PCN reaction occurring within the last 10 years: No If all of the above answers are NO, then may proceed with Cephalosporin use.     Patient Measurements: Height: 5' 4 (162.6 cm) Weight: 102.1 kg (225 lb) IBW/kg (Calculated) : 54.7 HEPARIN  DW (KG): 78.5  Vital Signs: Temp: 98.3 F (36.8 C) (10/18 0930) Temp Source: Oral (10/18 0329) BP: 127/66 (10/18 0930) Pulse Rate: 85 (10/18 0930)  Labs: Recent Labs    11/05/23 1514 11/05/23 1604 11/05/23 1923 11/06/23 0427 11/06/23 1309  HGB 11.0*  --   --  10.1*  --   HCT 33.7*  --   --  30.4*  --   PLT 186  --   --  183  --   APTT  --   --  23*  --   --   LABPROT  --   --  13.9  --   --   INR  --   --  1.0  --   --   HEPARINUNFRC  --   --   --  0.22* 0.30  CREATININE 0.94  --   --  0.82  --   TROPONINIHS 7 6  --   --   --     Estimated Creatinine Clearance: 72.2 mL/min (by C-G formula based on SCr of 0.82 mg/dL).   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   11/05/2023   SOLU-CORTEF  100 MG injection Inject 100 mg into the muscle once.   Past Week   amitriptyline  (ELAVIL ) 50 MG tablet Take 1 tablet (50 mg total) by mouth at bedtime. 90 tablet 1    amLODipine  (NORVASC ) 2.5 MG tablet Take 1 tablet (2.5 mg total) by mouth daily. 90 tablet 3    Blood Glucose Monitoring Suppl (ONETOUCH VERIO) w/Device KIT Use to check blood sugar 3 times a day and document results, bring to appointments.  Goal is <130 fasting blood sugar and <180 two hours after meals. 1 kit 0  Cholecalciferol (VITAMIN D ) 50 MCG (2000 UT) CAPS Take 1 capsule by mouth at bedtime.      Cyanocobalamin (VITAMIN B-12 PO) Take 1 tablet by mouth at bedtime.      EPINEPHrine  (EPIPEN  2-PAK) 0.3 mg/0.3 mL IJ SOAJ injection Inject 0.3 mg into the muscle as needed for anaphylaxis. 2 each 0    FIBER ADULT GUMMIES PO Take 1 tablet by mouth daily as needed.      furosemide  (LASIX ) 40 MG tablet TAKE 1 TABLET BY MOUTH EVERY DAY 90 tablet 4    hydrocortisone  (CORTEF ) 5 MG tablet TAKE THREE TABLETS IN THE MORNING AND ONE TABLET IN THE AFTERNOON (Patient taking differently: Take 5 mg by mouth daily.)      meclizine  (ANTIVERT ) 25 MG tablet Take 1 tablet (25 mg total) by mouth 3 (three) times daily as needed for dizziness. 60 tablet 2    metFORMIN  (GLUCOPHAGE ) 1000 MG tablet Take 1 tablet (1,000 mg total) by mouth 2 (two) times daily with a meal. 180 tablet 4    OneTouch UltraSoft 2 Lancets MISC USE TO CHECK BLOOD SUGAR 3 TIMES A DAY AND DOCUMENT RESULTS, BRING TO APPOINTMENTS. GOAL IS <130 FASTING BLOOD SUGAR AND <180 TWO HOURS  AFTER MEALS. 100 each 2    pantoprazole  (PROTONIX ) 20 MG tablet Take 1 tablet (20 mg total) by mouth daily. 90 tablet 1    pregabalin  (LYRICA ) 150 MG capsule Take 1 capsule (150 mg total) by mouth 2 (two) times daily. 150 mg in am, 150 MG in afternoon, and 300 mg in pm 60 capsule 0    rosuvastatin  (CRESTOR ) 10 MG tablet Take 1 tablet (10 mg total) by mouth daily. 90 tablet 3    Suvorexant  (BELSOMRA ) 15 MG TABS Take 1 tablet (15 mg total) by mouth at bedtime as needed. 30 tablet 0    Scheduled:   amitriptyline   50 mg Oral QHS   amLODipine   2.5 mg Oral Daily   furosemide   40 mg Oral Daily   hydrocortisone  sod succinate (SOLU-CORTEF ) inj  50 mg Intravenous Q8H   insulin  aspart  0-5 Units Subcutaneous QHS   insulin  aspart  0-9 Units Subcutaneous TID WC   losartan   50 mg Oral Daily   melatonin  5 mg Oral QHS   pantoprazole   20 mg Oral Daily   pregabalin   150 mg Oral BID   And   pregabalin   300 mg Oral QHS   rosuvastatin   10 mg Oral QHS   Infusions:   heparin  1,500 Units/hr (11/06/23 1239)    Assessment: 73 year old female with history of T2DM, CHF presenting to the emergency department for evaluation of left leg pain and swelling. Starting heparin  drip for Extensive acute appearing deep venous thrombosis throughout the entirety of the left lower extremity No anticoagulation PTA per med rec and med fill hx Baseline Hgb 11.0  Plt 186  INR 1.0 aPTT 23   Goal of Therapy:  Heparin  level 0.3-0.7 units/ml Monitor platelets by anticoagulation protocol: Yes  10/18 0427 HL 0.22, subtherapeutic 10/18 1309 HL 0.30, therapeutic x1, barely   Plan:  10/18 1309 HL 0.30, therapeutic x1, barely Will slightly Increase heparin  infusion to 1550 unit/hr check confirmatory HL in 8 hr  CBC daily while on heparin   Allean Haas PharmD Clinical Pharmacist 11/06/2023

## 2023-11-07 DIAGNOSIS — E271 Primary adrenocortical insufficiency: Secondary | ICD-10-CM | POA: Diagnosis not present

## 2023-11-07 DIAGNOSIS — E119 Type 2 diabetes mellitus without complications: Secondary | ICD-10-CM | POA: Diagnosis not present

## 2023-11-07 DIAGNOSIS — E669 Obesity, unspecified: Secondary | ICD-10-CM | POA: Diagnosis not present

## 2023-11-07 DIAGNOSIS — I82412 Acute embolism and thrombosis of left femoral vein: Secondary | ICD-10-CM | POA: Diagnosis not present

## 2023-11-07 LAB — HEPARIN LEVEL (UNFRACTIONATED): Heparin Unfractionated: 0.36 [IU]/mL (ref 0.30–0.70)

## 2023-11-07 LAB — GLUCOSE, CAPILLARY
Glucose-Capillary: 249 mg/dL — ABNORMAL HIGH (ref 70–99)
Glucose-Capillary: 263 mg/dL — ABNORMAL HIGH (ref 70–99)
Glucose-Capillary: 225 mg/dL — ABNORMAL HIGH (ref 70–99)
Glucose-Capillary: 206 mg/dL — ABNORMAL HIGH (ref 70–99)

## 2023-11-07 LAB — CBC
HCT: 30.5 % — ABNORMAL LOW (ref 36.0–46.0)
Hemoglobin: 9.9 g/dL — ABNORMAL LOW (ref 12.0–15.0)
MCH: 28.7 pg (ref 26.0–34.0)
MCHC: 32.5 g/dL (ref 30.0–36.0)
MCV: 88.4 fL (ref 80.0–100.0)
Platelets: 202 K/uL (ref 150–400)
RBC: 3.45 MIL/uL — ABNORMAL LOW (ref 3.87–5.11)
RDW: 14 % (ref 11.5–15.5)
WBC: 10.5 K/uL (ref 4.0–10.5)
nRBC: 0 % (ref 0.0–0.2)

## 2023-11-07 NOTE — Progress Notes (Signed)
 Progress Note   Patient: Sarah [name] FMW:[id number] DOB: [birth year] DOA: 11/05/2023     2 DOS: the patient was seen and examined on 11/07/2023   Brief hospital course: Breuna Loveall is a 73 y.o. 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  Left lower extremity duplex ultrasound showed extensive DVT, CT angiogram of the chest ruled out PE. Patient was seen by vascular surgery, scheduled for thrombectomy on Monday.  She was placed on heparin  drip.   Principal Problem:   DVT (deep venous thrombosis)_left leg extensive DVT Active Problems:   Leukocytosis   (HFpEF) heart failure with preserved ejection fraction (HCC)   Adrenal insufficiency (Addison's disease) (HCC)   Hypertension associated with diabetes (HCC)   Hyperlipidemia associated with type 2 diabetes mellitus (HCC)   Type 2 diabetes mellitus in patient with obesity (HCC)   Depression with anxiety   Obesity (BMI 30-39.9)   Class 2 obesity   Assessment and Plan: DVT (deep venous thrombosis)_left leg extensive DVT: Lower extremity venous Doppler showed extensive acute appearing deep venous thrombosis throughout the entirety of the left lower extremity. CTA negative for PE.  Continue heparin  drip, scheduled for thrombectomy on Monday. Has significant left leg edema, no shortness of breath or chest pain.   Chronic (HFpEF) heart failure with preserved ejection fraction (HCC): 2D echo on 09/05/2023 showed EF of 55-60% with grade 2 diastolic dysfunction.  BNP normal 38.3.   No exacerbation.   Depression with anxiety -Amitriptyline    Hypertension associated with diabetes (HCC) Continue home medicines.   Hyperlipidemia associated with type 2 diabetes mellitus (HCC) -Crestor    Adrenal insufficiency (Addison's disease) (HCC): Patient is  hydrocortisone . -Hold hydrocortisone  - Give stress dose Solu-Cortef  50 mg 3 times daily, patient will have surgery on Monday, will continue current dose.   Type 2 diabetes mellitus in patient with obesity [facility]): Recent A1c 6.9, continue sliding scale insulin    Obesity (BMI 30-39.9): Patient has Obesity Class II, with body weight 102.1  Kg and BMI 38.62 kg/m2.  Diet and exercise           Subjective:  Patient doing well today, still have a pain in the left leg and swelling.  Physical Exam: Vitals:   11/06/23 2003 11/07/23 0328 11/07/23 0509 11/07/23 0921  BP: 112/87 137/70  (!) 149/67  Pulse: 83 79  83  Resp:  15  16  Temp: 98.4 F (36.9 C) 97.6 F (36.4 C)  97.6 F (36.4 C)  TempSrc: Oral Oral    SpO2: 97% 92%  99%  Weight:   110.8 kg   Height:       General exam: Appears calm and comfortable  Respiratory system: Clear to auscultation. Respiratory effort normal. Cardiovascular system: S1 & S2 heard, RRR. No JVD, murmurs, rubs, gallops or clicks.  Gastrointestinal system: Abdomen is nondistended, soft and nontender. No organomegaly or masses felt. Normal bowel sounds heard. Central nervous system: Alert and oriented. No focal neurological deficits. Extremities: 3+ leg edema on the left. Skin: No rashes, lesions or ulcers Psychiatry: Judgement and insight appear normal. Mood & affect appropriate.    Data Reviewed:  Lab results reviewed.  Family Communication: None  Disposition: Status is: Inpatient Remains inpatient appropriate because: Severity disease, IV treatment, inpatient procedure.     Time spent: 35 minutes  Author: Murvin Mana, MD 11/07/2023 10:49 AM  For on call review [email or web address].

## 2023-11-07 NOTE — Progress Notes (Signed)
 PHARMACY - ANTICOAGULATION CONSULT NOTE  Pharmacy Consult for heparin  drip Indication: DVT  Allergies  Allergen Reactions   Clindamycin/Lincomycin Anaphylaxis   Doxycycline  Rash   Aspirin  Other (See Comments)    Pt is unsure of reaction, but states that she is allergic to aspirin    Ciprofloxacin Rash    very bad rash   Penicillins Rash    Has patient had a PCN reaction causing immediate rash, facial/tongue/throat swelling, SOB or lightheadedness with hypotension: No Has patient had a PCN reaction causing severe rash involving mucus membranes or skin necrosis: No Has patient had a PCN reaction that required hospitalization: No Has patient had a PCN reaction occurring within the last 10 years: No If all of the above answers are NO, then may proceed with Cephalosporin use.     Patient Measurements: Height: 5' 4 (162.6 cm) Weight: 110.8 kg (244 lb 4.3 oz) IBW/kg (Calculated) : 54.7 HEPARIN  DW (KG): 78.5  Vital Signs: Temp: 97.6 F (36.4 C) (10/19 0328) Temp Source: Oral (10/19 0328) BP: 137/70 (10/19 0328) Pulse Rate: 79 (10/19 0328)  Labs: Recent Labs    11/05/23 1514 11/05/23 1514 11/05/23 1604 11/05/23 1923 11/06/23 0427 11/06/23 1309 11/06/23 2137 11/07/23 0429  HGB 11.0*  --   --   --  10.1*  --   --  9.9*  HCT 33.7*  --   --   --  30.4*  --   --  30.5*  PLT 186  --   --   --  183  --   --  202  APTT  --   --   --  23*  --   --   --   --   LABPROT  --   --   --  13.9  --   --   --   --   INR  --   --   --  1.0  --   --   --   --   HEPARINUNFRC  --    < >  --   --  0.22* 0.30 0.30 0.36  CREATININE 0.94  --   --   --  0.82  --   --   --   TROPONINIHS 7  --  6  --   --   --   --   --    < > = values in this interval not displayed.    Estimated Creatinine Clearance: 75.5 mL/min (by C-G formula based on SCr of 0.82 mg/dL).   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   11/05/2023   SOLU-CORTEF  100 MG injection Inject 100 mg  into the muscle once.   Past Week   amitriptyline  (ELAVIL ) 50 MG tablet Take 1 tablet (50 mg total) by mouth at bedtime. 90 tablet 1    amLODipine  (NORVASC ) 2.5 MG tablet Take 1 tablet (2.5 mg total) by mouth daily. 90 tablet 3    Blood Glucose Monitoring Suppl (ONETOUCH VERIO) w/Device KIT Use to check blood sugar 3 times a day and document results, bring to appointments.  Goal is <130 fasting blood sugar and <180 two hours after meals. 1 kit 0    Cholecalciferol (VITAMIN D ) 50 MCG (2000 UT) CAPS Take 1 capsule by mouth at bedtime.      Cyanocobalamin (VITAMIN B-12 PO) Take 1 tablet by mouth at bedtime.      EPINEPHrine  (EPIPEN  2-PAK) 0.3 mg/0.3 mL IJ SOAJ injection Inject 0.3 mg into the muscle as needed for anaphylaxis. 2 each 0    FIBER ADULT GUMMIES PO Take 1 tablet by mouth daily as needed.      furosemide  (LASIX ) 40 MG tablet TAKE 1 TABLET BY MOUTH EVERY DAY 90 tablet 4    hydrocortisone  (CORTEF ) 5 MG tablet TAKE THREE TABLETS IN THE MORNING AND ONE TABLET IN THE AFTERNOON (Patient taking differently: Take 5 mg by mouth daily.)      meclizine  (ANTIVERT ) 25 MG tablet Take 1 tablet (25 mg total) by mouth 3 (three) times daily as needed for dizziness. 60 tablet 2    metFORMIN  (GLUCOPHAGE ) 1000 MG tablet Take 1 tablet (1,000 mg  total) by mouth 2 (two) times daily with a meal. 180 tablet 4    OneTouch UltraSoft 2 Lancets MISC USE TO CHECK BLOOD SUGAR 3 TIMES A DAY AND DOCUMENT RESULTS, BRING TO APPOINTMENTS. GOAL IS <130 FASTING BLOOD SUGAR AND <180 TWO HOURS AFTER MEALS. 100 each 2    pantoprazole  (PROTONIX ) 20 MG tablet Take 1 tablet (20 mg total) by mouth daily. 90 tablet 1    pregabalin  (LYRICA ) 150 MG capsule Take 1 capsule (150 mg total) by mouth 2 (two) times daily. 150 mg in am, 150 MG in afternoon, and 300 mg in pm 60 capsule 0    rosuvastatin  (CRESTOR ) 10 MG tablet Take 1 tablet (10 mg total) by mouth daily. 90 tablet 3    Suvorexant  (BELSOMRA ) 15 MG TABS Take 1 tablet (15 mg total) by mouth at bedtime as needed. 30 tablet 0    Scheduled:   amitriptyline   50 mg Oral QHS   amLODipine   2.5 mg Oral Daily   furosemide   40 mg Oral Daily   hydrocortisone  sod succinate (SOLU-CORTEF ) inj  50 mg Intravenous Q8H   insulin  aspart  0-5 Units Subcutaneous QHS   insulin  aspart  0-9 Units Subcutaneous TID WC   losartan   50 mg Oral Daily   melatonin  5 mg Oral QHS   pantoprazole   20 mg Oral Daily   pregabalin   150 mg Oral BID   And   pregabalin   300 mg Oral QHS   rosuvastatin   10 mg Oral QHS   Infusions:   heparin  1,550 Units/hr (11/07/23 0452)    Assessment: 73 year old female with history of T2DM, CHF presenting to the emergency department for evaluation of left leg pain and swelling. Starting heparin  drip for Extensive acute appearing deep venous thrombosis throughout the entirety of the left lower extremity No anticoagulation PTA per med rec and med fill hx Baseline Hgb 11.0  Plt 186  INR 1.0 aPTT 23   Goal of Therapy:  Heparin  level 0.3-0.7  units/ml Monitor platelets by anticoagulation protocol: Yes  10/18 0427 HL 0.22, subtherapeutic 10/18 1309 HL 0.30, therapeutic x1, barely 10/18 2206 HL 0.30, therapeutic x 2 10/19 0429 HL 0.36 therapeutic x 3   Plan:  Will continue heparin  infusion at 1550  unit/hr Recheck heparin  level daily w/ AM labs CBC daily while on heparin   Rankin CANDIE Dills, PharmD, [facility] 11/07/2023 5:43 AM

## 2023-11-07 NOTE — Plan of Care (Signed)

## 2023-11-08 ENCOUNTER — Encounter: Payer: Self-pay | Admitting: Vascular Surgery

## 2023-11-08 ENCOUNTER — Encounter
Admission: EM | Disposition: A | Discharge status: Skilled Nursing Facility | Payer: Self-pay | Source: Ambulatory Visit | Attending: Internal Medicine

## 2023-11-08 DIAGNOSIS — I871 Compression of vein: Secondary | ICD-10-CM | POA: Diagnosis not present

## 2023-11-08 DIAGNOSIS — E271 Primary adrenocortical insufficiency: Secondary | ICD-10-CM | POA: Diagnosis not present

## 2023-11-08 DIAGNOSIS — I82422 Acute embolism and thrombosis of left iliac vein: Secondary | ICD-10-CM | POA: Diagnosis not present

## 2023-11-08 DIAGNOSIS — I82432 Acute embolism and thrombosis of left popliteal vein: Secondary | ICD-10-CM

## 2023-11-08 DIAGNOSIS — I82412 Acute embolism and thrombosis of left femoral vein: Secondary | ICD-10-CM | POA: Diagnosis not present

## 2023-11-08 DIAGNOSIS — I5032 Chronic diastolic (congestive) heart failure: Secondary | ICD-10-CM | POA: Diagnosis not present

## 2023-11-08 DIAGNOSIS — Z981 Arthrodesis status: Secondary | ICD-10-CM | POA: Diagnosis not present

## 2023-11-08 HISTORY — PX: PERIPHERAL VASCULAR THROMBECTOMY: CATH118306

## 2023-11-08 LAB — COMPREHENSIVE METABOLIC PANEL WITH GFR
ALT: 12 U/L (ref 0–44)
AST: 13 U/L — ABNORMAL LOW (ref 15–41)
Albumin: 2.6 g/dL — ABNORMAL LOW (ref 3.5–5.0)
Alkaline Phosphatase: 49 U/L (ref 38–126)
Anion gap: 11 (ref 5–15)
BUN: 24 mg/dL — ABNORMAL HIGH (ref 8–23)
CO2: 26 mmol/L (ref 22–32)
Calcium: 8.5 mg/dL — ABNORMAL LOW (ref 8.9–10.3)
Chloride: 100 mmol/L (ref 98–111)
Creatinine, Ser: 0.79 mg/dL (ref 0.44–1.00)
GFR, Estimated: >60 mL/min (ref >60)
Glucose, Bld: 300 mg/dL — ABNORMAL HIGH (ref 70–99)
Potassium: 3.8 mmol/L (ref 3.5–5.1)
Sodium: 137 mmol/L (ref 135–145)
Total Bilirubin: 0.3 mg/dL (ref 0.0–1.2)
Total Protein: 5.9 g/dL — ABNORMAL LOW (ref 6.5–8.1)

## 2023-11-08 LAB — GLUCOSE, CAPILLARY
Glucose-Capillary: 233 mg/dL — ABNORMAL HIGH (ref 70–99)
Glucose-Capillary: 241 mg/dL — ABNORMAL HIGH (ref 70–99)
Glucose-Capillary: 268 mg/dL — ABNORMAL HIGH (ref 70–99)
Glucose-Capillary: 224 mg/dL — ABNORMAL HIGH (ref 70–99)
Glucose-Capillary: 285 mg/dL — ABNORMAL HIGH (ref 70–99)
Glucose-Capillary: 381 mg/dL — ABNORMAL HIGH (ref 70–99)

## 2023-11-08 LAB — CBC
HCT: 29.5 % — ABNORMAL LOW (ref 36.0–46.0)
Hemoglobin: 9.6 g/dL — ABNORMAL LOW (ref 12.0–15.0)
MCH: 28.5 pg (ref 26.0–34.0)
MCHC: 32.5 g/dL (ref 30.0–36.0)
MCV: 87.5 fL (ref 80.0–100.0)
Platelets: 214 K/uL (ref 150–400)
RBC: 3.37 MIL/uL — ABNORMAL LOW (ref 3.87–5.11)
RDW: 13.8 % (ref 11.5–15.5)
WBC: 8.6 K/uL (ref 4.0–10.5)
nRBC: 0 % (ref 0.0–0.2)

## 2023-11-08 LAB — MAGNESIUM: Magnesium: 1.9 mg/dL (ref 1.7–2.4)

## 2023-11-08 LAB — HEPARIN LEVEL (UNFRACTIONATED): Heparin Unfractionated: 0.45 [IU]/mL (ref 0.30–0.70)

## 2023-11-08 SURGERY — PERIPHERAL VASCULAR THROMBECTOMY
Anesthesia: Moderate Sedation | Laterality: Left

## 2023-11-08 MED ORDER — HEPARIN (PORCINE) IN NACL 1000-0.9 UT/500ML-% IV SOLN
INTRAVENOUS | Status: DC | PRN
  Administered 2023-11-08: 1000 mL

## 2023-11-08 MED ORDER — MIDAZOLAM HCL (PF) 2 MG/2ML IJ SOLN
INTRAMUSCULAR | Status: DC | PRN
  Administered 2023-11-08: 2 mg via INTRAVENOUS
  Administered 2023-11-08 (×4): .5 mg via INTRAVENOUS

## 2023-11-08 MED ORDER — MIDAZOLAM HCL 2 MG/2ML IJ SOLN
INTRAMUSCULAR | Status: AC
  Filled 2023-11-08: qty 2

## 2023-11-08 MED ORDER — ORAL CARE MOUTH RINSE: 15.0000 mL | OROMUCOSAL | Status: DC | PRN

## 2023-11-08 MED ORDER — FENTANYL CITRATE (PF) 50 MCG/ML IJ SOSY
PREFILLED_SYRINGE | INTRAMUSCULAR | Status: AC
  Filled 2023-11-08: qty 1

## 2023-11-08 MED ORDER — IODIXANOL 320 MG/ML IV SOLN
INTRAVENOUS | Status: DC | PRN
  Administered 2023-11-08: 40 mL

## 2023-11-08 MED ORDER — MIDAZOLAM HCL 2 MG/2ML IJ SOLN
INTRAMUSCULAR | Status: AC
  Filled 2023-11-08: qty 4

## 2023-11-08 MED ORDER — HYDROCORTISONE 10 MG PO TABS
20.0000 mg | ORAL_TABLET | Freq: Two times a day (BID) | ORAL | Status: DC
  Administered 2023-11-08 – 2023-11-10 (×5): 20 mg via ORAL
  Filled 2023-11-08 (×5): qty 2

## 2023-11-08 MED ORDER — CEFAZOLIN SODIUM-DEXTROSE 2-4 GM/100ML-% IV SOLN
2.0000 g | Freq: Once | INTRAVENOUS | Status: AC
  Administered 2023-11-08: 2 g via INTRAVENOUS

## 2023-11-08 MED ORDER — FENTANYL CITRATE (PF) 100 MCG/2ML IJ SOLN
INTRAMUSCULAR | Status: AC
  Filled 2023-11-08: qty 2

## 2023-11-08 MED ORDER — SODIUM CHLORIDE 0.9 % IV SOLN: INTRAVENOUS | Status: DC

## 2023-11-08 MED ORDER — CEFAZOLIN SODIUM-DEXTROSE 2-4 GM/100ML-% IV SOLN
INTRAVENOUS | Status: AC
  Filled 2023-11-08: qty 100

## 2023-11-08 MED ORDER — FENTANYL CITRATE (PF) 100 MCG/2ML IJ SOLN
INTRAMUSCULAR | Status: DC | PRN
  Administered 2023-11-08 (×3): 25 ug via INTRAVENOUS
  Administered 2023-11-08: 50 ug via INTRAVENOUS
  Administered 2023-11-08: 25 ug via INTRAVENOUS

## 2023-11-08 MED ORDER — SENNOSIDES-DOCUSATE SODIUM 8.6-50 MG PO TABS
2.0000 | ORAL_TABLET | Freq: Two times a day (BID) | ORAL | Status: DC
  Administered 2023-11-08 – 2023-11-10 (×4): 2 via ORAL
  Filled 2023-11-08 (×5): qty 2

## 2023-11-08 MED ORDER — LACTULOSE 10 GM/15ML PO SOLN
20.0000 g | Freq: Once | ORAL | Status: AC
  Administered 2023-11-08: 20 g via ORAL
  Filled 2023-11-08: qty 30

## 2023-11-08 MED ORDER — HEPARIN SODIUM (PORCINE) 1000 UNIT/ML IJ SOLN
INTRAMUSCULAR | Status: AC
Start: 2023-11-08 — End: 2023-11-08
  Filled 2023-11-08: qty 10

## 2023-11-08 MED ORDER — LIDOCAINE-EPINEPHRINE (PF) 1 %-1:200000 IJ SOLN
INTRAMUSCULAR | Status: DC | PRN
  Administered 2023-11-08: 10 mL

## 2023-11-08 SURGICAL SUPPLY — 15 items
BALLOON ATG 12X6X80 (BALLOONS) IMPLANT
CANISTER PENUMBRA ENGINE (MISCELLANEOUS) IMPLANT
CATH LIGHTNI FLASH 16XTORQ 100 (CATHETERS) IMPLANT
CLOSURE PERCLOSE PROSTYLE (Vascular Products) IMPLANT
COVER PROBE ULTRASOUND 5X96 (MISCELLANEOUS) IMPLANT
DEVICE PRESTO INFLATION (MISCELLANEOUS) IMPLANT
KIT MICROPUNCTURE VSI 5F STIFF (SHEATH) IMPLANT
NDL ENTRY 21GA 7CM ECHOTIP (NEEDLE) IMPLANT
NEEDLE ENTRY 21GA 7CM ECHOTIP (NEEDLE) ×1 IMPLANT
PACK ANGIOGRAPHY (CUSTOM PROCEDURE TRAY) ×1 IMPLANT
SHEATH BRITE TIP 6FRX11 (SHEATH) IMPLANT
SHEATH ELEMENT 17FR 13 (SHEATH) IMPLANT
SUT MNCRL AB 4-0 PS2 18 (SUTURE) IMPLANT
WIRE J 3MM .035X145CM (WIRE) IMPLANT
WIRE SUPRACORE 190CM (WIRE) IMPLANT

## 2023-11-08 NOTE — Plan of Care (Signed)

## 2023-11-08 NOTE — Op Note (Signed)
 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   US  guidance for vascular access to left popliteal vein 2.   Catheter placement into left external iliac vein from left popliteal approach 3.   IVC gram and left lower extremity venogram 4.   Mechanical thrombectomy to left superficial femoral vein, common femoral vein, external and common iliac veins with the penumbra 16 flash device 5.   PTA of left common iliac vein with 12 mm balloon    SURGEON: Selinda Gu, MD  ASSISTANT(S): none  ANESTHESIA: local with moderate conscious sedation for 40 minutes using 4 mg of Versed and 150 mcg of Fentanyl  ESTIMATED BLOOD LOSS: 300 cc  FINDING(S): 1.  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  Extensive spinal hardware in the area may have contributed to the stenosis.  SPECIMEN(S):  none  INDICATIONS:    Patient is a 73 y.o. female who presents with extensive left lower extremity DVT.  Patient has marked leg swelling and pain.  Venous intervention is performed to reduce the symtpoms and avoid long term postphlebitic symptoms.    DESCRIPTION: After obtaining full informed written consent, the patient was brought back to the vascular suite and placed supine upon the table. Moderate conscious sedation was administered during a face to face encounter with the patient throughout the procedure with my supervision of the RN administering medicines and monitoring the patient's vital signs, pulse oximetry, telemetry and mental status throughout from the start of the procedure until the patient was taken to the recovery room.  After obtaining adequate anesthesia, the patient was prepped and draped in the  standard fashion.    The patient was then placed into the prone position.  The left popliteal vein was then accessed under direct ultrasound guidance without difficulty with a micropuncture needle and a permanent image was recorded.  A micropuncture wire and sheath replaced and imaging performed to the micropuncture sheath showed extensive thrombus distally.  A J-wire was then placed and a ProGlide device was placed in a preclose fashion.  I then upsized to an 17 Fr sheath over a J wire.  4000 units of heparin  were then given.  Imaging showed extensive DVT with minimal flow.  A Kumpe catheter and Magic tourque wire were then advanced into the external iliac and images were performed.  There was extensive thrombosis of the left external and common iliac veins with what appeared to be a significant narrowing that was better seen after the thrombus was removed.  The IVC appeared to be patent. I used the Penumbra Cat 16 flash catheter and evacuated about 300 cc of effluent with mechanical thrombectomy throughout the iliac veins, CFV, and SFV.  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  This was in close proximity to her spinal hardware and we had to angle our images to be able to see it well.  I then turned my attention to the iliac veins.  The stenosis/occlusion and thrombus was treated with a 12 mm diameter angioplasty balloon.  Following angioplasty, there was brisk  flow through this area with what appeared to be a less than 25% residual stenosis.  Imaging was somewhat difficult due to the spinal hardware and patient motion, but there was a marked improvement after intervention.  I then elected to terminate the procedure.  The sheath was removed and a dressing was placed.  She was taken to the recovery room in stable condition having tolerated  the procedure well.    COMPLICATIONS: None  CONDITION: Stable  Selinda Gu 11/08/2023 9:44 AM

## 2023-11-08 NOTE — Interval H&P Note (Signed)
 History and Physical Interval Note:  11/08/2023 8:15 AM  Sarah [name]  has presented today for surgery, with the diagnosis of dvt.  The various methods of treatment have been discussed with the patient and family. After consideration of risks, benefits and other options for treatment, the patient has consented to  Procedure(s): PERIPHERAL VASCULAR THROMBECTOMY (Left) as a surgical intervention.  The patient's history has been reviewed, patient examined, no change in status, stable for surgery.  I have reviewed the patient's chart and labs.  Questions were answered to the patient's satisfaction.     Lynnlee Revels

## 2023-11-08 NOTE — Progress Notes (Signed)
  Progress Note   Patient: Sarah [name] FMW:[id number] DOB: [birth year] DOA: 11/05/2023     3 DOS: the patient was seen and examined on 11/08/2023   Brief hospital course: Sarah [name] is a 73 y.o. 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  Left lower extremity duplex ultrasound showed extensive DVT, CT angiogram of the chest ruled out PE. Thrombectomy performed on 10/20, continued on heparin  drip.   Principal Problem:   DVT (deep venous thrombosis)_left leg extensive DVT Active Problems:   Leukocytosis   (HFpEF) heart failure with preserved ejection fraction (HCC)   Adrenal insufficiency (Addison's disease) (HCC)   Hypertension associated with diabetes (HCC)   Hyperlipidemia associated with type 2 diabetes mellitus (HCC)   Type 2 diabetes mellitus in patient with obesity (HCC)   Depression with anxiety   Obesity (BMI 30-39.9)   Class 2 obesity   Assessment and Plan:  DVT (deep venous thrombosis)_left leg extensive DVT: Lower extremity venous Doppler showed extensive acute appearing deep venous thrombosis throughout the entirety of the left lower extremity. CTA negative for PE.  Has significant left leg edema, no shortness of breath or chest pain. Patient is continue with the heparin  infusion, thrombectomy performed on 10/20.  Will transition to Eliquis tomorrow before discharge. Obtain PT/OT P  Chronic (HFpEF) heart failure with preserved ejection fraction (HCC): 2D echo on 09/05/2023 showed EF of 55-60% with grade 2 diastolic dysfunction.  BNP normal 38.3.   Still stable   Depression with anxiety -Amitriptyline    Hypertension associated with diabetes (HCC) Continue home medicines.   Hyperlipidemia associated with type 2 diabetes mellitus (HCC) -Crestor    Adrenal insufficiency (Addison's  disease) [facility]): Patient is hydrocortisone . Was treated with hydrocortisone  IV.  Status post the surgery, will transition to oral hydrocortisone .   Type 2 diabetes mellitus in patient with obesity [facility]): Recent A1c 6.9, continue sliding scale insulin    Obesity (BMI 30-39.9): Patient has Obesity Class II, with body weight 102.1  Kg and BMI 38.62 kg/m2.  Diet and exercise       Subjective:  Patient doing well today, status post thrombectomy.  Physical Exam: Vitals:   11/08/23 0938 11/08/23 0943 11/08/23 1000 11/08/23 1015  BP: (!) 162/71 130/60 (!) 120/103 129/60  Pulse: (!) 0 (!) 0 80 75  Resp:  (!) 23 15 16   Temp:  98.1 F (36.7 C)  98.2 F (36.8 C)  TempSrc:  Oral  Oral  SpO2:  94% 94% 90%  Weight:      Height:       General exam: Appears calm and comfortable  Respiratory system: Clear to auscultation. Respiratory effort normal. Cardiovascular system: S1 & S2 heard, RRR. No JVD, murmurs, rubs, gallops or clicks.  Gastrointestinal system: Abdomen is nondistended, soft and nontender. No organomegaly or masses felt. Normal bowel sounds heard. Central nervous system: Alert and oriented. No focal neurological deficits. Extremities: 3+ left leg edema. Skin: No rashes, lesions or ulcers Psychiatry: Judgement and insight appear normal. Mood & affect appropriate.    Data Reviewed:  Results reviewed.  Family Communication: Daughter updated over the phone.  Disposition: Status is: Inpatient Remains inpatient appropriate because: Severity of disease, IV treatment.      Time spent: 35 minutes  Author: Murvin Mana, MD 11/08/2023 1:54 PM  For on call review [email or web address].

## 2023-11-08 NOTE — TOC Initial Note (Signed)
 Transition of Care [facility]) - Initial/Assessment Note    Patient Details  Name: Sarah [name] MRN: [id number] Date of Birth: Mar 04, 1950  Transition of Care [facility]) CM/SW Contact:    Corean [name], RN Phone Number: 11/08/2023, 3:22 PM  Clinical Narrative:                  Patient admitted from [facility] independent living  Patient requesting to go to the skilled side to [facility] PT OT pending Patient would require insurance auth Per Alfonso at [facility] patient does have 3 respite days available        Patient Goals and CMS Choice            Expected Discharge Plan and Services                                              Prior Living Arrangements/Services                       Activities of Daily Living   ADL Screening (condition at time of admission) Independently performs ADLs?: Yes (appropriate for developmental age) Is the patient deaf or have difficulty hearing?: No Does the patient have difficulty seeing, even when wearing glasses/contacts?: No Does the patient have difficulty concentrating, remembering, or making decisions?: No  Permission Sought/Granted                  Emotional Assessment              Admission diagnosis:  SOB (shortness of breath) [R06.02] DVT (deep venous thrombosis) (HCC) [I82.409] Left leg pain [M79.605] Acute deep vein thrombosis (DVT) of proximal vein of left lower extremity (HCC) [I82.4Y2] Patient Active Problem List   Diagnosis Date Noted   Class 2 obesity 11/06/2023   DVT (deep venous thrombosis)_left leg extensive DVT 11/05/2023   Depression with anxiety 11/05/2023   Obesity (BMI 30-39.9) 11/05/2023   Cerebral infarction, remote, resolved 10/13/2023   Dizziness 10/13/2023   Type 2 diabetes mellitus in patient with obesity (HCC) 09/19/2023   Syncope 09/14/2023   Leukocytosis 09/14/2023   (HFpEF) heart failure with preserved ejection fraction (HCC) 09/14/2023   Acute kidney injury  superimposed on chronic kidney disease 09/14/2023   Hypotension 09/14/2023   Gram-negative pneumonia (HCC) 09/05/2023   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   Obesity 09/20/2022   Osteopenia of neck of right femur 10/02/2021   Cervical radiculitis 07/11/2021   Vitamin D  deficiency 02/05/2021   Adrenal insufficiency (Addison's disease) (HCC) 02/04/2021   Chronic lower back pain 02/02/2021   Insomnia due to other mental disorder 02/02/2021   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   B12 deficiency 02/01/2018   History of antineoplastic chemotherapy 10/17/2017   Prolonged Q-T interval on ECG 09/04/2017   History of malignant melanoma 01/07/2017   Recurrent major depression in partial remission 12/26/2011   Hypertension associated with diabetes (HCC) 09/17/2010   Hyperlipidemia associated with type 2 diabetes mellitus (HCC) 09/17/2010   PCP:  Valerio Melanie ONEIDA, NP Pharmacy:   [facility] [phone] [address], [place] - [address] [place] [address] Phone: [phone] Fax: [phone]  [facility] - [place], [place] - [address] [place] [place] [address] Phone: [phone] Fax: [phone]  [facility] [address] [place] [address]  Phone: [phone] Fax: [phone]  [facility] [place], [place] - [address] [place] [address] Phone: [phone] Fax: [phone]     Social Drivers of Health (SDOH) Social History: SDOH Screenings   Food Insecurity: No Food Insecurity (11/06/2023)  Housing: Low Risk  (11/06/2023)  Transportation Needs: No Transportation Needs (11/07/2023)  Utilities: Not At Risk (11/07/2023)  Alcohol Screen: Low Risk  (09/29/2022)  Depression (PHQ2-9): Medium Risk (09/14/2023)  Financial Resource Strain: Low Risk  (10/24/2023)   Received from [facility]  Physical Activity:  Insufficiently Active (09/29/2022)  Social Connections: Moderately Integrated (11/07/2023)  Recent Concern: Social Connections - Moderately Isolated (09/05/2023)  Stress: No Stress Concern Present (09/29/2022)  Tobacco Use: Low Risk  (11/05/2023)  Health Literacy: Adequate Health Literacy (09/29/2022)   SDOH Interventions:     Readmission Risk Interventions     No data to display

## 2023-11-08 NOTE — Progress Notes (Signed)
 PHARMACY - ANTICOAGULATION CONSULT NOTE  Pharmacy Consult for heparin  drip Indication: DVT  Allergies  Allergen Reactions   Clindamycin/Lincomycin Anaphylaxis   Doxycycline  Rash   Aspirin  Other (See Comments)    Pt is unsure of reaction, but states that she is allergic to aspirin    Ciprofloxacin Rash    very bad rash   Penicillins Rash    Has patient had a PCN reaction causing immediate rash, facial/tongue/throat swelling, SOB or lightheadedness with hypotension: No Has patient had a PCN reaction causing severe rash involving mucus membranes or skin necrosis: No Has patient had a PCN reaction that required hospitalization: No Has patient had a PCN reaction occurring within the last 10 years: No If all of the above answers are NO, then may proceed with Cephalosporin use.     Patient Measurements: Height: 5' 4 (162.6 cm) Weight: 110.8 kg (244 lb 4.3 oz) IBW/kg (Calculated) : 54.7 HEPARIN  DW (KG): 78.5  Vital Signs: Temp: 97.8 F (36.6 C) (10/20 0501) Temp Source: Oral (10/19 2025) BP: 114/49 (10/20 0501) Pulse Rate: 80 (10/20 0501)  Labs: Recent Labs    11/05/23 1514 11/05/23 1604 11/05/23 1923 11/06/23 0427 11/06/23 1309 11/06/23 2137 11/07/23 0429 11/08/23 0527  HGB 11.0*  --   --  10.1*  --   --  9.9* 9.6*  HCT 33.7*  --   --  30.4*  --   --  30.5* 29.5*  PLT 186  --   --  183  --   --  202 214  APTT  --   --  23*  --   --   --   --   --   LABPROT  --   --  13.9  --   --   --   --   --   INR  --   --  1.0  --   --   --   --   --   HEPARINUNFRC  --   --   --  0.22*   < > 0.30 0.36 0.45  CREATININE 0.94  --   --  0.82  --   --   --   --   TROPONINIHS 7 6  --   --   --   --   --   --    < > = values in this interval not displayed.    Estimated Creatinine Clearance: 75.5 mL/min (by C-G formula based on SCr of 0.82 mg/dL).   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 90 tablet 1 Past Week   amLODipine  (NORVASC ) 2.5 MG tablet Take 1  tablet (2.5 mg total) by mouth daily. 90 tablet 3 Past Week   Cholecalciferol (VITAMIN D ) 50 MCG (2000 UT) CAPS Take 1 capsule by mouth at bedtime.   Past Week   Cyanocobalamin (VITAMIN B-12 PO) Take 1 tablet by mouth at bedtime.   Past Week   EPINEPHrine  (EPIPEN  2-PAK) 0.3 mg/0.3 mL IJ SOAJ injection Inject 0.3 mg into the muscle as needed for anaphylaxis. 2 each 0 Taking As Needed   FIBER ADULT GUMMIES PO Take 1 tablet by mouth daily as needed.   Unknown   furosemide  (LASIX ) 40 MG tablet TAKE 1 TABLET BY MOUTH EVERY DAY 90 tablet 4 Past Week   hydrocortisone  (CORTEF ) 5 MG tablet TAKE THREE TABLETS IN THE MORNING AND ONE TABLET IN THE AFTERNOON (Patient taking differently: Take 5 mg by mouth daily.)   Past Week   losartan  (COZAAR ) 50 MG tablet Take 50 mg by mouth daily.   Past Week   meclizine  (ANTIVERT ) 25 MG tablet Take 1 tablet (25 mg total) by mouth 3 (three) times daily as needed for dizziness. 60 tablet 2 Unknown   metFORMIN  (GLUCOPHAGE ) 1000 MG tablet Take 1 tablet (1,000 mg total) by mouth 2 (two) times daily with a meal. 180 tablet 4 Past Week   pantoprazole  (PROTONIX ) 20 MG tablet Take 1 tablet (20 mg total) by mouth daily. 90 tablet 1 Past Week   pregabalin  (LYRICA ) 150 MG capsule  Take 1 capsule (150 mg total) by mouth 2 (two) times daily. 150 mg in am, 150 MG in afternoon, and 300 mg in pm 60 capsule 0 Past Week   rosuvastatin  (CRESTOR ) 10 MG tablet Take 1 tablet (10 mg total) by mouth daily. 90 tablet 3 Past Week   Suvorexant  (BELSOMRA ) 15 MG TABS Take 1 tablet (15 mg total) by mouth at bedtime as needed. 30 tablet 0 Unknown   Blood Glucose Monitoring Suppl (ONETOUCH VERIO) w/Device KIT Use to check blood sugar 3 times a day and document results, bring to appointments.  Goal is <130 fasting blood sugar and <180 two hours after meals. 1 kit 0    OneTouch UltraSoft 2 Lancets MISC USE TO CHECK BLOOD SUGAR 3 TIMES A DAY AND DOCUMENT RESULTS, BRING TO APPOINTMENTS. GOAL IS <130 FASTING BLOOD SUGAR AND <180 TWO HOURS AFTER MEALS. 100 each 2    SOLU-CORTEF  100 MG injection Inject 100 mg into the muscle once. (Patient not taking: Reported on 11/07/2023)   Not Taking   Scheduled:   amitriptyline   50 mg Oral QHS   amLODipine   2.5 mg Oral Daily   furosemide   40 mg Oral Daily   hydrocortisone  sod succinate (SOLU-CORTEF ) inj  50 mg Intravenous Q8H   insulin  aspart  0-5 Units Subcutaneous QHS   insulin  aspart  0-9 Units Subcutaneous TID WC   losartan   50 mg Oral Daily   melatonin  5 mg Oral QHS   pantoprazole   20 mg Oral Daily   pregabalin   150 mg Oral BID   And   pregabalin   300 mg Oral QHS   rosuvastatin   10 mg Oral QHS   Infusions:   heparin  1,550 Units/hr (11/07/23 2323)    Assessment: 73 year old female with history of T2DM, CHF presenting to the emergency department for evaluation of left leg pain and swelling. Starting heparin  drip for Extensive acute appearing deep venous thrombosis throughout the entirety of the left lower extremity No anticoagulation PTA per med rec and med fill hx Baseline Hgb 11.0  Plt 186  INR 1.0 aPTT 23   Goal of Therapy:  Heparin  level 0.3-0.7 units/ml Monitor platelets by anticoagulation protocol: Yes  10/18 0427 HL 0.22,  subtherapeutic 10/18 1309 HL 0.30, therapeutic x1, barely 10/18 2206 HL 0.30, therapeutic x 2 10/19 0429 HL 0.36 therapeutic x 3 10/20 0527 HL 0.45, therapeutic x 4   Plan:  Will continue heparin  infusion at 1550 unit/hr Recheck heparin  level daily w/ AM labs CBC daily while on heparin   Rankin CANDIE Dills, PharmD, [facility] 11/08/2023 6:13 AM

## 2023-11-09 ENCOUNTER — Other Ambulatory Visit (HOSPITAL_COMMUNITY): Payer: Self-pay

## 2023-11-09 ENCOUNTER — Telehealth (HOSPITAL_COMMUNITY): Payer: Self-pay | Admitting: Pharmacy Technician

## 2023-11-09 DIAGNOSIS — E1159 Type 2 diabetes mellitus with other circulatory complications: Secondary | ICD-10-CM | POA: Diagnosis not present

## 2023-11-09 DIAGNOSIS — I5032 Chronic diastolic (congestive) heart failure: Secondary | ICD-10-CM | POA: Diagnosis not present

## 2023-11-09 DIAGNOSIS — I82412 Acute embolism and thrombosis of left femoral vein: Secondary | ICD-10-CM | POA: Diagnosis not present

## 2023-11-09 DIAGNOSIS — I152 Hypertension secondary to endocrine disorders: Secondary | ICD-10-CM | POA: Diagnosis not present

## 2023-11-09 LAB — GLUCOSE, CAPILLARY
Glucose-Capillary: 182 mg/dL — ABNORMAL HIGH (ref 70–99)
Glucose-Capillary: 362 mg/dL — ABNORMAL HIGH (ref 70–99)
Glucose-Capillary: 140 mg/dL — ABNORMAL HIGH (ref 70–99)
Glucose-Capillary: 148 mg/dL — ABNORMAL HIGH (ref 70–99)
Glucose-Capillary: 155 mg/dL — ABNORMAL HIGH (ref 70–99)

## 2023-11-09 LAB — CBC
HCT: 26.9 % — ABNORMAL LOW (ref 36.0–46.0)
Hemoglobin: 8.8 g/dL — ABNORMAL LOW (ref 12.0–15.0)
MCH: 28.7 pg (ref 26.0–34.0)
MCHC: 32.7 g/dL (ref 30.0–36.0)
MCV: 87.6 fL (ref 80.0–100.0)
Platelets: 232 K/uL (ref 150–400)
RBC: 3.07 MIL/uL — ABNORMAL LOW (ref 3.87–5.11)
RDW: 13.8 % (ref 11.5–15.5)
WBC: 8.9 K/uL (ref 4.0–10.5)
nRBC: 0.4 % — ABNORMAL HIGH (ref 0.0–0.2)

## 2023-11-09 LAB — HEPARIN LEVEL (UNFRACTIONATED): Heparin Unfractionated: 0.58 [IU]/mL (ref 0.30–0.70)

## 2023-11-09 MED ORDER — LACTULOSE 10 GM/15ML PO SOLN
30.0000 g | Freq: Once | ORAL | Status: AC
  Administered 2023-11-09: 30 g via ORAL
  Filled 2023-11-09: qty 60

## 2023-11-09 MED ORDER — INSULIN GLARGINE-YFGN 100 UNIT/ML ~~LOC~~ SOLN
8.0000 [IU] | Freq: Every day | SUBCUTANEOUS | Status: DC
  Administered 2023-11-09 – 2023-11-10 (×2): 8 [IU] via SUBCUTANEOUS
  Filled 2023-11-09 (×2): qty 0.08

## 2023-11-09 MED ORDER — APIXABAN 5 MG PO TABS
10.0000 mg | ORAL_TABLET | Freq: Two times a day (BID) | ORAL | Status: DC
  Administered 2023-11-09 – 2023-11-10 (×3): 10 mg via ORAL
  Filled 2023-11-09 (×3): qty 2

## 2023-11-09 MED ORDER — APIXABAN 5 MG PO TABS: 5.0000 mg | ORAL_TABLET | Freq: Two times a day (BID) | ORAL | Status: DC

## 2023-11-09 MED ORDER — FLEET ENEMA RE ENEM
1.0000 | ENEMA | Freq: Once | RECTAL | Status: AC
  Administered 2023-11-09: 1 via RECTAL

## 2023-11-09 NOTE — TOC Progression Note (Signed)
 Transition of Care [facility]) - Progression Note    Patient Details  Name: Makynzee Tigges MRN: [id number] Date of Birth: August 06, 1950  Transition of Care [facility]) CM/SW Contact  Corean [name], RN Phone Number: 11/09/2023, 10:39 AM  Clinical Narrative:          Notified by therapy via secure chat that recs are for SNF Requested [facility] with IP Care Management to initiate auth after notes are in for [place] [facility] notified                Expected Discharge Plan and Services                                               Social Drivers of Health (SDOH) Interventions SDOH Screenings   Food Insecurity: No Food Insecurity (11/06/2023)  Housing: Low Risk  (11/06/2023)  Transportation Needs: No Transportation Needs (11/07/2023)  Utilities: Not At Risk (11/07/2023)  Alcohol Screen: Low Risk  (09/29/2022)  Depression (PHQ2-9): Medium Risk (09/14/2023)  Financial Resource Strain: Low Risk  (10/24/2023)   [facility]  Physical Activity: Insufficiently Active (09/29/2022)  Social Connections: Moderately Integrated (11/07/2023)  Recent Concern: Social Connections - Moderately Isolated (09/05/2023)  Stress: No Stress Concern Present (09/29/2022)  Tobacco Use: Low Risk  (11/05/2023)  Health Literacy: Adequate Health Literacy (09/29/2022)    Readmission Risk Interventions     No data to display

## 2023-11-09 NOTE — Care Management Important Message (Signed)
 Important Message  Patient Details  Name: Sarah [name] MRN: [id number] Date of Birth: 1950-08-27   Important Message Given:  Yes - Medicare IM     Sarah [name] 11/09/2023, 1:43 PM

## 2023-11-09 NOTE — Evaluation (Signed)
 Physical Therapy Evaluation Patient Details Name: Sarah [name] MRN: [id number] DOB: [birth year] Today's Date: 11/09/2023  History of Present Illness  73 y.o. 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 Work up revealed extensive DVT. S/p thrombectomy on 10/20.   Clinical Impression  Pt A&Ox4, pleasant and agreeable to PT/OT co-evaluation. At most recent baseline, pt reports using quad cane or rollator for ambulation, cites multiple recent falls since previous admission. Pt was met semi-supine in bed, minA supine > sit for LLE assist. Performed 2 STS from elevated EOB with CGA and VC for proper hand placement on RW. Pt amb short distance around bed to recliner with no LOB or c/o dizziness, mild decreased stance time on LLE but no instability noted. Pt was left seated in recliner at end of session, all needs in reach. Pt would benefit from skilled PT to address listed deficits (see PT Problem List) and allow for safe return to PLOF.         If plan is discharge home, recommend the following: A little help with walking and/or transfers;A little help with bathing/dressing/bathroom;Assistance with cooking/housework;Assist for transportation   Can travel by private vehicle   Yes    Equipment Recommendations Other (comment) (TBD at next venue of care)  Recommendations for Other Services       Functional Status Assessment Patient has had a recent decline in their functional status and demonstrates the ability to make significant improvements in function in a reasonable and predictable amount of time.     Precautions / Restrictions Precautions Precautions: Fall Recall of Precautions/Restrictions: Intact Precaution/Restrictions Comments: watch dizziness with positional changes - not orthostatic on  evaluation Restrictions Weight Bearing Restrictions Per Provider Order: No      Mobility  Bed Mobility Overal bed mobility: Needs Assistance Bed Mobility: Supine to Sit     Supine to sit: Min assist, HOB elevated, Used rails     General bed mobility comments: minA to assist LLE out of bed    Transfers Overall transfer level: Needs assistance Equipment used: Rolling walker (2 wheels) Transfers: Sit to/from Stand Sit to Stand: Contact guard assist, From elevated surface           General transfer comment: 2 STS from EOB with CGA, VC for hand placement on RW    Ambulation/Gait Ambulation/Gait assistance: Contact guard assist Gait Distance (Feet): 10 Feet Assistive device: Rolling walker (2 wheels) Gait Pattern/deviations: Step-through pattern, Decreased stance time - left, Wide base of support Gait velocity: decreased     General Gait Details: No LOB, pt able to walk short distance in room with no dizziness or lightheadedness. Mild decreased stance time on LLE, no buckling or instability  Stairs            Wheelchair Mobility     Tilt Bed    Modified Rankin (Stroke Patients Only)       Balance Overall balance assessment: Needs assistance Sitting-balance support: Feet supported Sitting balance-Leahy Scale: Fair     Standing balance support: Single extremity supported, Bilateral upper extremity supported, No upper extremity supported Standing balance-Leahy Scale: Fair Standing balance comment: mild increased postural sway with no UE support                             Pertinent Vitals/Pain Pain Assessment  Pain Assessment: 0-10 Pain Score: 3  Pain Location: lateral thigh L Pain Descriptors / Indicators: Aching Pain Intervention(s): Limited activity within patient's tolerance, Monitored during session, Repositioned    Home Living Family/patient expects to be discharged to:: Private residence Living Arrangements: Alone Available Help  at Discharge: Family;Available PRN/intermittently Type of Home: Independent living facility Home Access: Level entry       Home Layout: One level Home Equipment: Shower seat - built in;Grab bars - toilet;Grab bars - tub/shower;Hand held shower head;Cane - quad;Rollator (4 wheels) Additional Comments: Pt [facility]    Prior Function Prior Level of Function : Independent/Modified Independent;Driving;History of Falls (last six months)             Mobility Comments: Pt reports using quad cane for household ambulation, rollator for community ambulation. Pt cites at least 3 falls since last admission ADLs Comments: IND with ADLs, still driving     Extremity/Trunk Assessment   Upper Extremity Assessment Upper Extremity Assessment: Defer to OT evaluation    Lower Extremity Assessment Lower Extremity Assessment: Generalized weakness       Communication   Communication Communication: No apparent difficulties    Cognition Arousal: Alert Behavior During Therapy: WFL for tasks assessed/performed   PT - Cognitive impairments: No apparent impairments                       PT - Cognition Comments: A&Ox4, pleasant and cooperative Following commands: Intact       Cueing Cueing Techniques: Verbal cues     General Comments General comments (skin integrity, edema, etc.): LLE dressing behind knee dry and intact throughout session    Exercises Other Exercises Other Exercises: Orthostatic vitals assessed: 143/83 sitting, 144/72 initial standing, 172/81 standing for 3 minutes. Pt reported brief dizziness with position changes   Assessment/Plan    PT Assessment Patient needs continued PT services  PT Problem List Decreased strength;Decreased activity tolerance;Decreased balance;Decreased mobility;Decreased safety awareness;Pain       PT Treatment Interventions DME instruction;Gait training;Functional mobility training;Therapeutic  activities;Therapeutic exercise;Balance training;Neuromuscular re-education;Patient/family education    PT Goals (Current goals can be found in the Care Plan section)  Acute Rehab PT Goals Patient Stated Goal: to go to [facility] PT Goal Formulation: With patient Time For Goal Achievement: 11/23/23 Potential to Achieve Goals: Fair    Frequency Min 2X/week     Co-evaluation PT/OT/SLP Co-Evaluation/Treatment: Yes Reason for Co-Treatment: To address functional/ADL transfers PT goals addressed during session: Mobility/safety with mobility OT goals addressed during session: ADL's and self-care;Proper use of Adaptive equipment and DME       AM-PAC PT 6 Clicks Mobility  Outcome Measure Help needed turning from your back to your side while in a flat bed without using bedrails?: None Help needed moving from lying on your back to sitting on the side of a flat bed without using bedrails?: A Little Help needed moving to and from a bed to a chair (including a wheelchair)?: A Little Help needed standing up from a chair using your arms (e.g., wheelchair or bedside chair)?: A Little Help needed to walk in hospital room?: A Little Help needed climbing 3-5 steps with a railing? : A Lot 6 Click Score: 18    End of Session Equipment Utilized During Treatment: Gait belt Activity Tolerance: Patient tolerated treatment well Patient left: in chair;with call bell/phone within reach;with chair alarm set Nurse Communication: Mobility status PT Visit Diagnosis: Unsteadiness on feet (R26.81);Other  abnormalities of gait and mobility (R26.89);Muscle weakness (generalized) (M62.81);History of falling (Z91.81);Pain Pain - Right/Left: Left Pain - part of body: Leg    Time: 9098-9064 PT Time Calculation (min) (ACUTE ONLY): 34 min   Charges:   PT Evaluation $PT Eval Moderate Complexity: 1 Mod   PT General Charges $$ ACUTE PT VISIT: 1 Visit         Janell Axe, SPT

## 2023-11-09 NOTE — Progress Notes (Signed)
 Mobility Specialist Progress Note:    11/09/23 1159  Mobility  Activity Ambulated with assistance  Level of Assistance Standby assist, set-up cues, supervision of patient - no hands on  Assistive Device Front wheel walker  Distance Ambulated (ft) 15 ft  Range of Motion/Exercises Active;All extremities  Activity Response Tolerated well  Mobility visit 1 Mobility  Mobility Specialist Start Time (ACUTE ONLY) 1132  Mobility Specialist Stop Time (ACUTE ONLY) 1138  Mobility Specialist Time Calculation (min) (ACUTE ONLY) 6 min   Assisted pt to bathroom. Tolerated well, asx throughout. Left pt supine, alarm on and belongings in reach. All needs met.  Sherrilee Ditty Mobility Specialist Please contact via Special educational needs teacher or  Rehab office at [phone]

## 2023-11-09 NOTE — Progress Notes (Signed)
 Progress Note   Patient: Sarah [name] FMW:[id number] DOB: May 14, 1950 DOA: 11/05/2023     4 DOS: the patient was seen and examined on 11/09/2023   Brief hospital course: Sarah [name] is a 73 y.o. 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  Left lower extremity duplex ultrasound showed extensive DVT, CT angiogram of the chest ruled out PE. Thrombectomy performed on 10/20, continued on heparin  drip, transition to oral Eliquis 10/21. Medically stable for discharge, but pending nursing home placement.   Principal Problem:   DVT (deep venous thrombosis)_left leg extensive DVT Active Problems:   Leukocytosis   (HFpEF) heart failure with preserved ejection fraction (HCC)   Adrenal insufficiency (Addison's disease) (HCC)   Hypertension associated with diabetes (HCC)   Hyperlipidemia associated with type 2 diabetes mellitus (HCC)   Type 2 diabetes mellitus in patient with obesity (HCC)   Depression with anxiety   Obesity (BMI 30-39.9)   Class 2 obesity   Assessment and Plan: DVT (deep venous thrombosis)_left leg extensive DVT: Lower extremity venous Doppler showed extensive acute appearing deep venous thrombosis throughout the entirety of the left lower extremity. CTA negative for PE.  Has significant left leg edema, no shortness of breath or chest pain. Patient is continue with the heparin  infusion, thrombectomy performed on 10/20.  Changed to Eliquis today. Patient seen by PT/OT, recommending nursing placement.  TOC aware.   Chronic (HFpEF) heart failure with preserved ejection fraction (HCC): 2D echo on 09/05/2023 showed EF of 55-60% with grade 2 diastolic dysfunction.  BNP normal 38.3.   Still stable   Depression with anxiety -Amitriptyline    Hypertension associated with diabetes (HCC) Continue home  medicines.   Hyperlipidemia associated with type 2 diabetes mellitus (HCC) -Crestor    Adrenal insufficiency (Addison's disease) (HCC): Patient is hydrocortisone . Was treated with hydrocortisone  IV.  Status post the surgery, transitioned to oral hydrocortisone .   Type 2 diabetes mellitus in patient with obesity [name]): Recent A1c 6.9, continue sliding scale insulin    Obesity (BMI 30-39.9): Patient has Obesity Class II, with body weight 102.1  Kg and BMI 38.62 kg/m2.  Diet and exercise       Subjective:  Patient doing well today, leg swelling much better.  No short of breath or chest pain  Physical Exam: Vitals:   11/08/23 1919 11/09/23 0138 11/09/23 0239 11/09/23 0800  BP: (!) 114/34  (!) 155/71 122/69  Pulse: 81  78 77  Resp: 18  18 18   Temp: 98 F (36.7 C)  98.2 F (36.8 C) 98 F (36.7 C)  TempSrc:    Oral  SpO2: 98%  97% 95%  Weight:  112.6 kg    Height:       General exam: Appears calm and comfortable  Respiratory system: Clear to auscultation. Respiratory effort normal. Cardiovascular system: S1 & S2 heard, RRR. No JVD, murmurs, rubs, gallops or clicks. Gastrointestinal system: Abdomen is nondistended, soft and nontender. No organomegaly or masses felt. Normal bowel sounds heard. Central nervous system: Alert and oriented. No focal neurological deficits. Extremities: Edema improving.. Skin: No rashes, lesions or ulcers Psychiatry: Judgement and insight appear normal. Mood & affect appropriate.    Data Reviewed:  Lab results reviewed  Family Communication: None  Disposition: Status is: Inpatient Remains inpatient appropriate because: Unsafe discharge, pending nursing home placement     Time spent: 35 minutes  Author: Murvin Mana, MD 11/09/2023 11:04 AM  For on call review [email or web address].

## 2023-11-09 NOTE — NC FL2 (Signed)
 [place]  MEDICAID FL2 LEVEL OF CARE FORM     IDENTIFICATION  Patient Name: Sarah [name] Birthdate: 06/11/1950 Sex: female Admission Date (Current Location): 11/05/2023  [facility] and [place] Number:  Chiropodist and Address:  [facility], [address] [place], [place] [address]      Provider Number: [id number]  Attending Physician Name and Address:  Laurita Pillion, MD  Relative Name and Phone Number:       Current Level of Care: Hospital Recommended Level of Care: Skilled Nursing Facility Prior Approval Number:    Date Approved/Denied:   PASRR Number: pending  Discharge Plan: SNF    Current Diagnoses: Patient Active Problem List   Diagnosis Date Noted   Class 2 obesity 11/06/2023   DVT (deep venous thrombosis)_left leg extensive DVT 11/05/2023   Depression with anxiety 11/05/2023   Obesity (BMI 30-39.9) 11/05/2023   Cerebral infarction, remote, resolved 10/13/2023   Dizziness 10/13/2023   Type 2 diabetes mellitus in patient with obesity (HCC) 09/19/2023   Syncope 09/14/2023   Leukocytosis 09/14/2023   (HFpEF) heart failure with preserved ejection fraction (HCC) 09/14/2023   Acute kidney injury superimposed on chronic kidney disease 09/14/2023   Hypotension 09/14/2023   Gram-negative pneumonia (HCC) 09/05/2023   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   Obesity 09/20/2022   Osteopenia of neck of right femur 10/02/2021   Cervical radiculitis 07/11/2021   Vitamin D  deficiency 02/05/2021   Adrenal insufficiency (Addison's disease) (HCC) 02/04/2021   Chronic lower back pain 02/02/2021   Insomnia due to other mental disorder 02/02/2021   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   B12 deficiency 02/01/2018   History of antineoplastic chemotherapy 10/17/2017   Prolonged Q-T interval on ECG 09/04/2017   History of malignant melanoma 01/07/2017   Recurrent major depression in partial remission 12/26/2011   Hypertension associated with diabetes  (HCC) 09/17/2010   Hyperlipidemia associated with type 2 diabetes mellitus (HCC) 09/17/2010    Orientation RESPIRATION BLADDER Height & Weight     Self, Time, Situation, Place  Normal Continent Weight: 112.6 kg Height:  5' 4 (162.6 cm)  BEHAVIORAL SYMPTOMS/MOOD NEUROLOGICAL BOWEL NUTRITION STATUS      Continent Diet (Carb modifed)  AMBULATORY STATUS COMMUNICATION OF NEEDS Skin   Extensive Assist Verbally Skin abrasions, Bruising, Other (Comment) (blister, erythema)                       Personal Care Assistance Level of Assistance              Functional Limitations Info             SPECIAL CARE FACTORS FREQUENCY  PT (By licensed PT), OT (By licensed OT)                    Contractures Contractures Info: Not present    Additional Factors Info  Code Status, Allergies Code Status Info: Full Allergies Info: Clindamycin/lincomycin, Doxycycline , Aspirin , Ciprofloxacin, Penicillins           Current Medications (11/09/2023):  This is the current hospital active medication list Current Facility-Administered Medications  Medication Dose Route Frequency Provider Last Rate Last Admin   acetaminophen  (TYLENOL ) tablet 650 mg  650 mg Oral Q6H PRN Dew, Jason S, MD       albuterol  (PROVENTIL ) (2.5 MG/3ML) 0.083% nebulizer solution 2.5 mg  2.5 mg Inhalation Q4H PRN Dew, Jason S, MD       amitriptyline  (ELAVIL ) tablet 50 mg  50 mg Oral  QHS Dew, Jason S, MD   50 mg at 11/08/23 2159   amLODipine  (NORVASC ) tablet 2.5 mg  2.5 mg Oral Daily Dew, Jason S, MD   2.5 mg at 11/09/23 0833   apixaban (ELIQUIS) tablet 10 mg  10 mg Oral BID Grubb, Rodney D, RPH   10 mg at 11/09/23 9167   Followed by   NOREEN ON 11/13/2023] apixaban (ELIQUIS) tablet 5 mg  5 mg Oral BID Nada Adriana BIRCH, [facility]       furosemide  (LASIX ) tablet 40 mg  40 mg Oral Daily Dew, Jason S, MD   40 mg at 11/09/23 9166   Heparin  (Porcine) in NaCl 1000-0.9 UT/500ML-% SOLN    PRN Dew, Jason S, MD   1,000 mL at  11/08/23 0940   hydrALAZINE (APRESOLINE) injection 5 mg  5 mg Intravenous Q2H PRN Dew, Jason S, MD       hydrocortisone  (CORTEF ) tablet 20 mg  20 mg Oral BID Zhang, Dekui, MD   20 mg at 11/09/23 9166   insulin  aspart (novoLOG ) injection 0-5 Units  0-5 Units Subcutaneous QHS Dew, Jason S, MD   5 Units at 11/08/23 2206   insulin  aspart (novoLOG ) injection 0-9 Units  0-9 Units Subcutaneous TID WC Dew, Jason S, MD   9 Units at 11/09/23 1158   insulin  glargine-yfgn (SEMGLEE) injection 8 Units  8 Units Subcutaneous Daily Laurita Pillion, MD       iodixanol (VISIPAQUE) 320 MG/ML injection    PRN Dew, Jason S, MD   40 mL at 11/08/23 0940   lidocaine -EPINEPHrine  (PF) (XYLOCAINE -EPINEPHrine ) 1 %-1:200000 (PF) injection    PRN Marea Selinda RAMAN, MD   10 mL at 11/08/23 0940   losartan  (COZAAR ) tablet 50 mg  50 mg Oral Daily Dew, Jason S, MD   50 mg at 11/09/23 9171   meclizine  (ANTIVERT ) tablet 25 mg  25 mg Oral TID PRN Dew, Jason S, MD       melatonin tablet 5 mg  5 mg Oral QHS Dew, Jason S, MD   5 mg at 11/08/23 2159   ondansetron  (ZOFRAN ) injection 4 mg  4 mg Intravenous Q8H PRN Dew, Jason S, MD       Oral care mouth rinse  15 mL Mouth Rinse PRN Dew, Selinda RAMAN, MD       oxyCODONE -acetaminophen  (PERCOCET/ROXICET) 5-325 MG per tablet 1 tablet  1 tablet Oral Q4H PRN Dew, Jason S, MD   1 tablet at 11/06/23 1455   pantoprazole  (PROTONIX ) EC tablet 20 mg  20 mg Oral Daily Dew, Jason S, MD   20 mg at 11/09/23 9167   pregabalin  (LYRICA ) capsule 150 mg  150 mg Oral BID Dew, Jason S, MD   150 mg at 11/09/23 9171   And   pregabalin  (LYRICA ) capsule 300 mg  300 mg Oral QHS Dew, Jason S, MD   300 mg at 11/08/23 2159   rosuvastatin  (CRESTOR ) tablet 10 mg  10 mg Oral QHS Dew, Jason S, MD   10 mg at 11/08/23 2159   senna-docusate (Senokot-S) tablet 2 tablet  2 tablet Oral BID Laurita Pillion, MD   2 tablet at 11/09/23 9171   Suvorexant  TABS 15 mg  15 mg Oral QHS PRN Dew, Jason S, MD         Discharge Medications: Please see  discharge summary for a list of discharge medications.  Relevant Imaging Results:  Relevant Lab Results:   Additional Information ss [id number]  Corean [name], RN

## 2023-11-09 NOTE — Telephone Encounter (Signed)
 Patient Product/process development scientist completed.    The patient is insured through [facility]. Patient has Medicare and is not eligible for a copay card, but may be able to apply for patient assistance or Medicare RX Payment Plan (Patient Must reach out to their plan, if eligible for payment plan), if available.    Ran test claim for Eliquis 5 mg and the current 30 day co-pay is $0.00.   This test claim was processed through [facility]- copay amounts may vary at other pharmacies due to pharmacy/plan contracts, or as the patient moves through the different stages of their insurance plan.     Sarah [name], CPHT Pharmacy Technician Patient Advocate Specialist Lead [facility] Patient Advocate Team Direct Number: [phone]  Fax: [phone]

## 2023-11-09 NOTE — Evaluation (Signed)
 Occupational Therapy Evaluation Patient Details Name: Sarah [name] MRN: [id number] DOB: 1950-10-27 Today's Date: 11/09/2023   History of Present Illness   73 y.o. 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 Work up revealed extensive DVT. S/p thrombectomy on 10/20.     Clinical Impressions Pt was seen for OT evaluation and cotx with PT this date. Prior to hospital admission, pt was independent with ADL, IADL, and caring for her dog in her independent living villa at [facility]. She endorses a few falls in the past 11mo and also notes recent history of dizziness with positional changes since admission in August. Pt presents with deficits in LLE pain, LLE pins and needles, decr strength, balance, and dizzy with positional changes (resolves with time, BP only increased), affecting safe and optimal ADL completion. Pt currently requires MIN A for bed mobility, CGA for ADL transfers with VC for hand placement on RW, MIN A for LB ADL tasks, and CGA in standing for grooming tasks at the sink. Pt with 1 episode of increased dizziness after standing for 3+min where she felt sudden need to return to sitting EOB. Pt then able to ambulate around the bed to the recliner. Pt edu in ECS including home/routines modifications, AE/DME for LB ADL tasks and minimizing bending/positional changes when possible, falls prevention, and activity pacing with gradual return to prior activity levels (enjoys walking her dog). Pt verbalized understanding. Pt would benefit from skilled OT services to address noted impairments and functional limitations (see below for any additional details) in order to maximize safety and independence while minimizing future risk of falls, injury, and readmission. Do anticipate the need for follow up OT services upon  acute hospital DC.    If plan is discharge home, recommend the following:   A little help with walking and/or transfers;A little help with bathing/dressing/bathroom;Assistance with cooking/housework;Assist for transportation;Help with stairs or ramp for entrance     Functional Status Assessment   Patient has had a recent decline in their functional status and demonstrates the ability to make significant improvements in function in a reasonable and predictable amount of time.     Equipment Recommendations   Other (comment) (consider 2WW pending progress)     Recommendations for Other Services         Precautions/Restrictions   Precautions Precautions: Fall Recall of Precautions/Restrictions: Intact Precaution/Restrictions Comments: watch dizziness with positional changes - not orthostatic on evaluation Restrictions Weight Bearing Restrictions Per Provider Order: No     Mobility Bed Mobility Overal bed mobility: Needs Assistance Bed Mobility: Supine to Sit     Supine to sit: Min assist, HOB elevated, Used rails     General bed mobility comments: MIN A for LLE    Transfers Overall transfer level: Needs assistance Equipment used: Rolling walker (2 wheels) Transfers: Sit to/from Stand Sit to Stand: Contact guard assist, From elevated surface           General transfer comment: VC for hand placement      Balance Overall balance assessment: Needs assistance Sitting-balance support: No upper extremity supported, Feet supported Sitting balance-Leahy Scale: Fair     Standing balance support: Single extremity supported, No upper extremity supported, During functional activity Standing balance-Leahy Scale: Fair  ADL either performed or assessed with clinical judgement   ADL Overall ADL's : Needs assistance/impaired     Grooming: Standing;Supervision/safety;Oral care;Wash/dry hands;Brushing hair;Contact guard  assist Grooming Details (indicate cue type and reason): intermittent UE support on counter             Lower Body Dressing: Sit to/from stand;Minimal assistance Lower Body Dressing Details (indicate cue type and reason): decreased hip flexibility, unable to utilize figure 4 technique             Functional mobility during ADLs: Contact guard assist;Rolling walker (2 wheels)       Vision         Perception         Praxis         Pertinent Vitals/Pain Pain Assessment Pain Assessment: 0-10 Pain Score: 3  Pain Location: lateral thigh L Pain Descriptors / Indicators: Aching Pain Intervention(s): Monitored during session, Repositioned     Extremity/Trunk Assessment Upper Extremity Assessment Upper Extremity Assessment: Overall WFL for tasks assessed   Lower Extremity Assessment Lower Extremity Assessment: Defer to PT evaluation;Generalized weakness       Communication Communication Communication: No apparent difficulties   Cognition Arousal: Alert Behavior During Therapy: WFL for tasks assessed/performed Cognition: No apparent impairments             OT - Cognition Comments: good self awareness                 Following commands: Intact       Cueing  General Comments   Cueing Techniques: Verbal cues  Pt was symptomatic with each positional change and at one point even had to sit promptly due to dizziness. Orthostatics were taken but her BP only seemed to go up. (140's/70's  up to 170's/80's).   Exercises Other Exercises Other Exercises: Pt edu in ECS including home/routines modifications, AE/DME for LB ADL tasks and minimizing bending/positional changes when possible, falls prevention, and activity pacing with gradual return to prior activity levels (enjoys walking her dog)   Shoulder Instructions      Home Living Family/patient expects to be discharged to:: Private residence [facility] : [facility]) Living Arrangements:  Alone Available Help at Discharge: Family;Available PRN/intermittently (daughter, SIL) Type of Home: Independent living facility Home Access: Level entry     Home Layout: One level     Bathroom Shower/Tub: Producer, television/film/video: Standard     Home Equipment: Shower seat - built in;Grab bars - toilet;Grab bars - tub/shower;Hand held shower head;Cane - quad;Rollator (4 wheels)          Prior Functioning/Environment Prior Level of Function : Independent/Modified Independent;Driving;History of Falls (last six months)             Mobility Comments: rollator for community mobility, QC in the home, 4 falls in 40mo ADLs Comments: indep, indep with caring for her dog, has dinner delivered to her villa, does light meal prep at home, drives    OT Problem List: Decreased strength;Pain;Cardiopulmonary status limiting activity;Decreased activity tolerance;Impaired balance (sitting and/or standing);Decreased knowledge of use of DME or AE   OT Treatment/Interventions: Self-care/ADL training;Therapeutic exercise;Therapeutic activities;Energy conservation;DME and/or AE instruction;Patient/family education;Balance training      OT Goals(Current goals can be found in the care plan section)   Acute Rehab OT Goals Patient Stated Goal: go to [facility] SNF for rehab then go home OT Goal Formulation: With patient Time For Goal Achievement: 11/23/23 Potential to Achieve Goals: Good ADL Goals Pt Will  Perform Lower Body Dressing: with modified independence;sitting/lateral leans;sit to/from stand;with adaptive equipment Pt Will Transfer to Toilet: with modified independence;ambulating (LRAD) Pt Will Perform Toileting - Clothing Manipulation and hygiene: with modified independence Additional ADL Goal #1: Pt will verbalize plan to implement at least 2 learned home/routines modifications to help prevent positional dizziness during ADL/mobility.   OT Frequency:  Min 2X/week    Co-evaluation  PT/OT/SLP Co-Evaluation/Treatment: Yes Reason for Co-Treatment: To address functional/ADL transfers PT goals addressed during session: Mobility/safety with mobility;Proper use of DME;Balance OT goals addressed during session: ADL's and self-care;Proper use of Adaptive equipment and DME      AM-PAC OT 6 Clicks Daily Activity     Outcome Measure Help from another person eating meals?: None Help from another person taking care of personal grooming?: A Little Help from another person toileting, which includes using toliet, bedpan, or urinal?: A Little Help from another person bathing (including washing, rinsing, drying)?: A Little Help from another person to put on and taking off regular upper body clothing?: None Help from another person to put on and taking off regular lower body clothing?: A Little 6 Click Score: 20   End of Session Nurse Communication: Mobility status;Other (comment) (dizziness)  Activity Tolerance: Patient tolerated treatment well;Treatment limited secondary to medical complications (Comment) (dizziness) Patient left: in chair;with call bell/phone within reach;with chair alarm set  OT Visit Diagnosis: Other abnormalities of gait and mobility (R26.89);Repeated falls (R29.6);Muscle weakness (generalized) (M62.81);Pain Pain - Right/Left: Left Pain - part of body: Leg (lateral aspect of L thigh)                Time: 9092-9063 OT Time Calculation (min): 29 min Charges:  OT General Charges $OT Visit: 1 Visit OT Evaluation $OT Eval Low Complexity: 1 Low OT Treatments $Self Care/Home Management : 8-22 mins  Warren SAUNDERS., MPH, MS, OTR/L ascom [phone] 11/09/23, 10:01 AM

## 2023-11-09 NOTE — TOC Progression Note (Signed)
 Transition of Care [facility]) - Progression Note    Patient Details  Name: Sarah [name] MRN: [id number] Date of Birth: 1950/12/07  [facility] CM/SW Contact  Corean [name], RN Phone Number: 11/09/2023, 3:47 PM  Clinical Narrative:      PASRR pending Clinical uploaded to [facility]                   Expected Discharge Plan and Services                                               Social Drivers of Health (SDOH) Interventions SDOH Screenings   Food Insecurity: No Food Insecurity (11/06/2023)  Housing: Low Risk  (11/06/2023)  Transportation Needs: No Transportation Needs (11/07/2023)  Utilities: Not At Risk (11/07/2023)  Alcohol Screen: Low Risk  (09/29/2022)  Depression (PHQ2-9): Medium Risk (09/14/2023)  Financial Resource Strain: Low Risk  (10/24/2023)   Received from [facility]  Physical Activity: Insufficiently Active (09/29/2022)  Social Connections: Moderately Integrated (11/07/2023)  Recent Concern: Social Connections - Moderately Isolated (09/05/2023)  Stress: No Stress Concern Present (09/29/2022)  Tobacco Use: Low Risk  (11/05/2023)  Health Literacy: Adequate Health Literacy (09/29/2022)    Readmission Risk Interventions     No data to display

## 2023-11-09 NOTE — Progress Notes (Signed)
 PHARMACY - ANTICOAGULATION CONSULT NOTE  Pharmacy Consult for transition from heparin  drip to apixaban Indication: DVT  Allergies  Allergen Reactions   Clindamycin/Lincomycin Anaphylaxis   Doxycycline  Rash   Aspirin  Other (See Comments)    Pt is unsure of reaction, but states that she is allergic to aspirin    Ciprofloxacin Rash    very bad rash   Penicillins Rash    Has patient had a PCN reaction causing immediate rash, facial/tongue/throat swelling, SOB or lightheadedness with hypotension: No Has patient had a PCN reaction causing severe rash involving mucus membranes or skin necrosis: No Has patient had a PCN reaction that required hospitalization: No Has patient had a PCN reaction occurring within the last 10 years: No If all of the above answers are NO, then may proceed with Cephalosporin use.     Patient Measurements: Height: 5' 4 (162.6 cm) Weight: 112.6 kg (248 lb 3.8 oz) IBW/kg (Calculated) : 54.7 HEPARIN  DW (KG): 78.5  Vital Signs: Temp: 98.2 F (36.8 C) (10/21 0239) BP: 155/71 (10/21 0239) Pulse Rate: 78 (10/21 0239)  Labs: Recent Labs    11/07/23 0429 11/08/23 0527 11/09/23 0417  HGB 9.9* 9.6* 8.8*  HCT 30.5* 29.5* 26.9*  PLT 202 214 232  HEPARINUNFRC 0.36 0.45 0.58  CREATININE  --  0.79  --     Estimated Creatinine Clearance: 78.2 mL/min (by C-G formula based on SCr of 0.79 mg/dL).   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 90 tablet 1 Past Week   amLODipine  (NORVASC ) 2.5 MG tablet Take 1 tablet (2.5 mg total) by mouth daily. 90 tablet 3 Past Week   Cholecalciferol (VITAMIN D ) 50 MCG (2000 UT) CAPS Take 1 capsule by mouth at bedtime.   Past Week   Cyanocobalamin (VITAMIN B-12 PO) Take 1 tablet by mouth at bedtime.   Past Week   EPINEPHrine  (EPIPEN  2-PAK) 0.3 mg/0.3 mL IJ SOAJ injection Inject 0.3 mg into the muscle as needed for anaphylaxis. 2 each 0 Taking As Needed   FIBER ADULT GUMMIES PO Take 1 tablet by mouth daily as needed.   Unknown   furosemide  (LASIX ) 40 MG tablet TAKE 1 TABLET BY MOUTH EVERY DAY 90 tablet 4 Past Week   hydrocortisone  (CORTEF ) 5 MG tablet TAKE THREE TABLETS IN THE MORNING AND ONE TABLET IN THE AFTERNOON (Patient taking differently: Take 5 mg by mouth daily.)   Past Week   losartan  (COZAAR ) 50 MG tablet Take 50 mg by mouth daily.   Past Week   meclizine  (ANTIVERT ) 25 MG tablet Take 1 tablet (25 mg total) by mouth 3 (three) times daily as  needed for dizziness. 60 tablet 2 Unknown   metFORMIN  (GLUCOPHAGE ) 1000 MG tablet Take 1 tablet (1,000 mg total) by mouth 2 (two) times daily with a meal. 180 tablet 4 Past Week   pantoprazole  (PROTONIX ) 20 MG tablet Take 1 tablet (20 mg total) by mouth daily. 90 tablet 1 Past Week   pregabalin  (LYRICA ) 150 MG capsule Take 1 capsule (150 mg total) by mouth 2 (two) times daily. 150 mg in am, 150 MG in afternoon, and 300 mg in pm 60 capsule 0 Past Week   rosuvastatin  (CRESTOR ) 10 MG tablet Take 1 tablet (10 mg total) by mouth daily. 90 tablet 3 Past Week   Suvorexant  (BELSOMRA ) 15 MG TABS Take 1 tablet (15 mg total) by mouth at bedtime as needed. 30 tablet 0 Unknown   Blood Glucose Monitoring Suppl (ONETOUCH VERIO) w/Device KIT Use to check blood sugar  3 times a day and document results, bring to appointments.  Goal is <130 fasting blood sugar and <180 two hours after meals. 1 kit 0    OneTouch UltraSoft 2 Lancets MISC USE TO CHECK BLOOD SUGAR 3 TIMES A DAY AND DOCUMENT RESULTS, BRING TO APPOINTMENTS. GOAL IS <130 FASTING BLOOD SUGAR AND <180 TWO HOURS AFTER MEALS. 100 each 2    SOLU-CORTEF  100 MG injection Inject 100 mg into the muscle once. (Patient not taking: Reported on 11/07/2023)   Not Taking   Scheduled:   amitriptyline   50 mg Oral QHS   amLODipine   2.5 mg Oral Daily   furosemide   40 mg Oral Daily   hydrocortisone   20 mg Oral BID   insulin  aspart  0-5 Units Subcutaneous QHS   insulin  aspart  0-9 Units Subcutaneous TID WC   losartan   50 mg Oral Daily   melatonin  5 mg Oral QHS   pantoprazole   20 mg Oral Daily   pregabalin   150 mg Oral BID   And   pregabalin   300 mg Oral QHS   rosuvastatin   10 mg Oral QHS   senna-docusate  2 tablet Oral BID    Assessment: 73 year old female with history of T2DM, CHF presenting to the emergency department for evaluation of left leg pain and swelling. Starting heparin  drip for Extensive acute appearing deep venous thrombosis throughout the entirety of the left lower extremity No anticoagulation PTA per med rec and med fill hx  Goal of Therapy:  Monitor platelets by anticoagulation protocol: Yes   Plan:  ---stop heparin  infusion  ---start apixaban 10 mg po BID x 4 days (>72h consecutive therapeutic heparin  levels while on IV heparin ) then 5 mg po BID thereafter ---CBC and serum creatinine at least once weekly   Adriana Bolster, PharmD, BCPS 11/09/2023 8:05 AM

## 2023-11-09 NOTE — Progress Notes (Signed)
 PHARMACY - ANTICOAGULATION CONSULT NOTE  Pharmacy Consult for heparin  drip Indication: DVT  Allergies  Allergen Reactions   Clindamycin/Lincomycin Anaphylaxis   Doxycycline  Rash   Aspirin  Other (See Comments)    Pt is unsure of reaction, but states that she is allergic to aspirin    Ciprofloxacin Rash    very bad rash   Penicillins Rash    Has patient had a PCN reaction causing immediate rash, facial/tongue/throat swelling, SOB or lightheadedness with hypotension: No Has patient had a PCN reaction causing severe rash involving mucus membranes or skin necrosis: No Has patient had a PCN reaction that required hospitalization: No Has patient had a PCN reaction occurring within the last 10 years: No If all of the above answers are NO, then may proceed with Cephalosporin use.     Patient Measurements: Height: 5' 4 (162.6 cm) Weight: 112.6 kg (248 lb 3.8 oz) IBW/kg (Calculated) : 54.7 HEPARIN  DW (KG): 78.5  Vital Signs: Temp: 98.2 F (36.8 C) (10/21 0239) BP: 155/71 (10/21 0239) Pulse Rate: 78 (10/21 0239)  Labs: Recent Labs    11/07/23 0429 11/08/23 0527 11/09/23 0417  HGB 9.9* 9.6* 8.8*  HCT 30.5* 29.5* 26.9*  PLT 202 214 232  HEPARINUNFRC 0.36 0.45 0.58  CREATININE  --  0.79  --     Estimated Creatinine Clearance: 78.2 mL/min (by C-G formula based on SCr of 0.79 mg/dL).   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 90 tablet 1 Past Week   amLODipine  (NORVASC ) 2.5 MG tablet Take 1 tablet (2.5 mg total) by mouth daily. 90 tablet 3 Past Week   Cholecalciferol (VITAMIN D ) 50 MCG (2000 UT) CAPS Take 1 capsule by mouth at bedtime.   Past Week   Cyanocobalamin (VITAMIN B-12 PO) Take 1 tablet by mouth at bedtime.   Past Week   EPINEPHrine  (EPIPEN  2-PAK) 0.3 mg/0.3 mL IJ SOAJ injection Inject 0.3 mg into the muscle as needed for anaphylaxis. 2 each 0 Taking As Needed   FIBER ADULT GUMMIES PO Take 1 tablet by mouth daily as needed.   Unknown   furosemide  (LASIX ) 40 MG tablet TAKE 1 TABLET BY MOUTH EVERY DAY 90 tablet 4 Past Week   hydrocortisone  (CORTEF ) 5 MG tablet TAKE THREE TABLETS IN THE MORNING AND ONE TABLET IN THE AFTERNOON (Patient taking differently: Take 5 mg by mouth daily.)   Past Week   losartan  (COZAAR ) 50 MG tablet Take 50 mg by mouth daily.   Past Week   meclizine  (ANTIVERT ) 25 MG tablet Take 1 tablet (25 mg total) by mouth 3 (three) times daily as needed for dizziness. 60  tablet 2 Unknown   metFORMIN  (GLUCOPHAGE ) 1000 MG tablet Take 1 tablet (1,000 mg total) by mouth 2 (two) times daily with a meal. 180 tablet 4 Past Week   pantoprazole  (PROTONIX ) 20 MG tablet Take 1 tablet (20 mg total) by mouth daily. 90 tablet 1 Past Week   pregabalin  (LYRICA ) 150 MG capsule Take 1 capsule (150 mg total) by mouth 2 (two) times daily. 150 mg in am, 150 MG in afternoon, and 300 mg in pm 60 capsule 0 Past Week   rosuvastatin  (CRESTOR ) 10 MG tablet Take 1 tablet (10 mg total) by mouth daily. 90 tablet 3 Past Week   Suvorexant  (BELSOMRA ) 15 MG TABS Take 1 tablet (15 mg total) by mouth at bedtime as needed. 30 tablet 0 Unknown   Blood Glucose Monitoring Suppl (ONETOUCH VERIO) w/Device KIT Use to check blood sugar 3 times a day and  document results, bring to appointments.  Goal is <130 fasting blood sugar and <180 two hours after meals. 1 kit 0    OneTouch UltraSoft 2 Lancets MISC USE TO CHECK BLOOD SUGAR 3 TIMES A DAY AND DOCUMENT RESULTS, BRING TO APPOINTMENTS. GOAL IS <130 FASTING BLOOD SUGAR AND <180 TWO HOURS AFTER MEALS. 100 each 2    SOLU-CORTEF  100 MG injection Inject 100 mg into the muscle once. (Patient not taking: Reported on 11/07/2023)   Not Taking   Scheduled:   amitriptyline   50 mg Oral QHS   amLODipine   2.5 mg Oral Daily   furosemide   40 mg Oral Daily   hydrocortisone   20 mg Oral BID   insulin  aspart  0-5 Units Subcutaneous QHS   insulin  aspart  0-9 Units Subcutaneous TID WC   losartan   50 mg Oral Daily   melatonin  5 mg Oral QHS   pantoprazole   20 mg Oral Daily   pregabalin   150 mg Oral BID   And   pregabalin   300 mg Oral QHS   rosuvastatin   10 mg Oral QHS   senna-docusate  2 tablet Oral BID   Infusions:   heparin  1,550 Units/hr (11/08/23 1741)    Assessment: 73 year old female with history of T2DM, CHF presenting to the emergency department for evaluation of left leg pain and swelling. Starting heparin  drip for Extensive acute appearing deep venous thrombosis throughout the entirety of the left lower extremity No anticoagulation PTA per med rec and med fill hx Baseline Hgb 11.0  Plt 186  INR 1.0 aPTT 23   Goal of Therapy:  Heparin  level 0.3-0.7 units/ml Monitor platelets by anticoagulation protocol: Yes  10/18 0427 HL 0.22, subtherapeutic 10/18 1309 HL 0.30, therapeutic x1, barely 10/18 2206 HL 0.30, therapeutic x 2 10/19 0429 HL 0.36 therapeutic x 3 10/20 0527 HL 0.45, therapeutic x 4 10/21 0417 HL 0.58, therapeutic x 5   Plan:  Will continue heparin  infusion at 1550 unit/hr Recheck heparin  level daily w/ AM labs CBC daily while on heparin   Rankin CANDIE Dills, PharmD, [facility] 11/09/2023 5:12 AM

## 2023-11-10 ENCOUNTER — Other Ambulatory Visit: Payer: Self-pay

## 2023-11-10 DIAGNOSIS — I82412 Acute embolism and thrombosis of left femoral vein: Secondary | ICD-10-CM | POA: Diagnosis not present

## 2023-11-10 DIAGNOSIS — D638 Anemia in other chronic diseases classified elsewhere: Secondary | ICD-10-CM | POA: Diagnosis present

## 2023-11-10 DIAGNOSIS — E66813 Obesity, class 3: Secondary | ICD-10-CM | POA: Diagnosis present

## 2023-11-10 LAB — CBC
HCT: 28.1 % — ABNORMAL LOW (ref 36.0–46.0)
Hemoglobin: 9 g/dL — ABNORMAL LOW (ref 12.0–15.0)
MCH: 28.3 pg (ref 26.0–34.0)
MCHC: 32 g/dL (ref 30.0–36.0)
MCV: 88.4 fL (ref 80.0–100.0)
Platelets: 240 K/uL (ref 150–400)
RBC: 3.18 MIL/uL — ABNORMAL LOW (ref 3.87–5.11)
RDW: 14 % (ref 11.5–15.5)
WBC: 10.2 K/uL (ref 4.0–10.5)
nRBC: 0.6 % — ABNORMAL HIGH (ref 0.0–0.2)

## 2023-11-10 LAB — GLUCOSE, CAPILLARY
Glucose-Capillary: 170 mg/dL — ABNORMAL HIGH (ref 70–99)
Glucose-Capillary: 205 mg/dL — ABNORMAL HIGH (ref 70–99)

## 2023-11-10 MED ORDER — PREGABALIN 150 MG PO CAPS
ORAL_CAPSULE | ORAL | 0 refills | Status: DC
  Filled 2023-11-10: qty 120, 30d supply, fill #0

## 2023-11-10 MED ORDER — SENNOSIDES-DOCUSATE SODIUM 8.6-50 MG PO TABS
2.0000 | ORAL_TABLET | Freq: Two times a day (BID) | ORAL | 0 refills | Status: AC
  Filled 2023-11-10: qty 120, 30d supply, fill #0

## 2023-11-10 MED ORDER — APIXABAN 5 MG PO TABS
ORAL_TABLET | ORAL | 0 refills | Status: DC
  Filled 2023-11-10: qty 66, 30d supply, fill #0

## 2023-11-10 MED ORDER — HYDROCORTISONE 20 MG PO TABS
20.0000 mg | ORAL_TABLET | Freq: Two times a day (BID) | ORAL | 0 refills | Status: DC
  Filled 2023-11-10: qty 60, 30d supply, fill #0

## 2023-11-10 MED ORDER — ALBUTEROL SULFATE (2.5 MG/3ML) 0.083% IN NEBU
2.5000 mg | INHALATION_SOLUTION | RESPIRATORY_TRACT | 12 refills | Status: DC | PRN
  Filled 2023-11-10: qty 75, 5d supply, fill #0

## 2023-11-10 MED ORDER — INSULIN GLARGINE 100 UNIT/ML SOLOSTAR PEN
8.0000 [IU] | PEN_INJECTOR | Freq: Every day | SUBCUTANEOUS | 11 refills | Status: DC
  Filled 2023-11-10: qty 3, 37d supply, fill #0

## 2023-11-10 NOTE — NC FL2 (Signed)
 [place]  MEDICAID FL2 LEVEL OF CARE FORM     IDENTIFICATION  Patient Name: Sarah [name] Birthdate: [birth year] Sex: female Admission Date (Current Location): 11/05/2023  [facility] and [place] Number:  Chiropodist and Address:  [facility], [address], [place], [place] [address]      Provider Number: [id number]  Attending Physician Name and Address:  Lanetta Lingo, MD  Relative Name and Phone Number:       Current Level of Care: Hospital Recommended Level of Care: Skilled Nursing Facility Prior Approval Number:    Date Approved/Denied:   PASRR Number: pending  Discharge Plan: SNF    Current Diagnoses: Patient Active Problem List   Diagnosis Date Noted   Obesity, Class III, BMI 40-49.9 (morbid obesity) (HCC) 11/10/2023   Anemia of chronic disease 11/10/2023   Class 2 obesity 11/06/2023   DVT (deep venous thrombosis)_left leg extensive DVT 11/05/2023   Depression with anxiety 11/05/2023   Obesity (BMI 30-39.9) 11/05/2023   Cerebral infarction, remote, resolved 10/13/2023   Dizziness 10/13/2023   Type 2 diabetes mellitus in patient with obesity (HCC) 09/19/2023   Syncope 09/14/2023   Leukocytosis 09/14/2023   (HFpEF) heart failure with preserved ejection fraction (HCC) 09/14/2023   Acute kidney injury superimposed on chronic kidney disease 09/14/2023   Hypotension 09/14/2023   Gram-negative pneumonia (HCC) 09/05/2023   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   Obesity 09/20/2022   Osteopenia of neck of right femur 10/02/2021   Cervical radiculitis 07/11/2021   Vitamin D  deficiency 02/05/2021   Adrenal insufficiency (Addison's disease) (HCC) 02/04/2021   Chronic lower back pain 02/02/2021   Insomnia due to other mental disorder 02/02/2021   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   B12 deficiency 02/01/2018   History of antineoplastic chemotherapy 10/17/2017   Prolonged Q-T interval on ECG 09/04/2017   History of malignant melanoma  01/07/2017   Recurrent major depression in partial remission 12/26/2011   Hypertension associated with diabetes (HCC) 09/17/2010   Hyperlipidemia associated with type 2 diabetes mellitus (HCC) 09/17/2010    Orientation RESPIRATION BLADDER Height & Weight     Self, Time, Situation, Place  Normal Continent Weight: 112.3 kg Height:  5' 4 (162.6 cm)  BEHAVIORAL SYMPTOMS/MOOD NEUROLOGICAL BOWEL NUTRITION STATUS      Continent Diet (Carb modifed)  AMBULATORY STATUS COMMUNICATION OF NEEDS Skin   Extensive Assist Verbally Skin abrasions, Bruising, Other (Comment) (blister, erythema)                       Personal Care Assistance Level of Assistance              Functional Limitations Info             SPECIAL CARE FACTORS FREQUENCY  PT (By licensed PT), OT (By licensed OT)                    Contractures Contractures Info: Not present    Additional Factors Info  Code Status, Allergies Code Status Info: Full Allergies Info: Clindamycin/lincomycin, Doxycycline , Aspirin , Ciprofloxacin, Penicillins           Current Medications (11/10/2023):  This is the current hospital active medication list Current Facility-Administered Medications  Medication Dose Route Frequency Provider Last Rate Last Admin   acetaminophen  (TYLENOL ) tablet 650 mg  650 mg Oral Q6H PRN Dew, Jason S, MD       albuterol  (PROVENTIL ) (2.5 MG/3ML) 0.083% nebulizer solution 2.5 mg  2.5 mg Inhalation Q4H PRN Dew,  Selinda RAMAN, MD       amitriptyline  (ELAVIL ) tablet 50 mg  50 mg Oral QHS Dew, Jason S, MD   50 mg at 11/09/23 2200   amLODipine  (NORVASC ) tablet 2.5 mg  2.5 mg Oral Daily Dew, Jason S, MD   2.5 mg at 11/10/23 0919   apixaban (ELIQUIS) tablet 10 mg  10 mg Oral BID Grubb, Rodney D, RPH   10 mg at 11/10/23 9080   Followed by   NOREEN ON 11/13/2023] apixaban (ELIQUIS) tablet 5 mg  5 mg Oral BID Nada Adriana BIRCH, RPH       furosemide  (LASIX ) tablet 40 mg  40 mg Oral Daily Dew, Jason S, MD   40 mg  at 11/10/23 9080   Heparin  (Porcine) in NaCl 1000-0.9 UT/500ML-% SOLN    PRN Dew, Jason S, MD   1,000 mL at 11/08/23 0940   hydrALAZINE (APRESOLINE) injection 5 mg  5 mg Intravenous Q2H PRN Dew, Jason S, MD       hydrocortisone  (CORTEF ) tablet 20 mg  20 mg Oral BID Zhang, Dekui, MD   20 mg at 11/10/23 9081   insulin  aspart (novoLOG ) injection 0-5 Units  0-5 Units Subcutaneous QHS Dew, Jason S, MD   5 Units at 11/08/23 2206   insulin  aspart (novoLOG ) injection 0-9 Units  0-9 Units Subcutaneous TID WC Dew, Jason S, MD   2 Units at 11/10/23 9082   insulin  glargine-yfgn [facility]) injection 8 Units  8 Units Subcutaneous Daily Zhang, Dekui, MD   8 Units at 11/10/23 0917   iodixanol (VISIPAQUE) 320 MG/ML injection    PRN Marea Selinda RAMAN, MD   40 mL at 11/08/23 0940   lidocaine -EPINEPHrine  (PF) (XYLOCAINE -EPINEPHrine ) 1 %-1:200000 (PF) injection    PRN Marea Selinda RAMAN, MD   10 mL at 11/08/23 0940   losartan  (COZAAR ) tablet 50 mg  50 mg Oral Daily Dew, Jason S, MD   50 mg at 11/10/23 9080   meclizine  (ANTIVERT ) tablet 25 mg  25 mg Oral TID PRN Dew, Jason S, MD       melatonin tablet 5 mg  5 mg Oral QHS Dew, Jason S, MD   5 mg at 11/09/23 2200   ondansetron  (ZOFRAN ) injection 4 mg  4 mg Intravenous Q8H PRN Dew, Jason S, MD       Oral care mouth rinse  15 mL Mouth Rinse PRN Marea, Selinda RAMAN, MD       oxyCODONE -acetaminophen  (PERCOCET/ROXICET) 5-325 MG per tablet 1 tablet  1 tablet Oral Q4H PRN Dew, Jason S, MD   1 tablet at 11/06/23 1455   pantoprazole  (PROTONIX ) EC tablet 20 mg  20 mg Oral Daily Dew, Jason S, MD   20 mg at 11/10/23 9080   pregabalin  (LYRICA ) capsule 150 mg  150 mg Oral BID Dew, Jason S, MD   150 mg at 11/10/23 9081   And   pregabalin  (LYRICA ) capsule 300 mg  300 mg Oral QHS Dew, Jason S, MD   300 mg at 11/09/23 2200   rosuvastatin  (CRESTOR ) tablet 10 mg  10 mg Oral QHS Dew, Jason S, MD   10 mg at 11/09/23 2200   senna-docusate (Senokot-S) tablet 2 tablet  2 tablet Oral BID Laurita Pillion, MD   2  tablet at 11/10/23 9081   Suvorexant  TABS 15 mg  15 mg Oral QHS PRN Dew, Jason S, MD         Discharge Medications: Please see discharge summary for a list of discharge  medications.  Relevant Imaging Results:  Relevant Lab Results:   Additional Information Ss [id number]  Corean [name], RN

## 2023-11-10 NOTE — Plan of Care (Signed)

## 2023-11-10 NOTE — Discharge Summary (Addendum)
 Physician Discharge Summary   Patient: Sarah [name] MRN: [id number] DOB: 12/18/1950  Admit date:     11/05/2023  Discharge date: 11/10/23  Discharge Physician: Gwenda Heiner   PCP: Cannady, [name] T, NP   Recommendations at discharge:   Take anticoagulants as recommended Check blood sugars daily Return to the ER for evaluation of worsening symptoms  Discharge Diagnoses: Principal Problem:   DVT (deep venous thrombosis)_left leg extensive DVT Active Problems:   Leukocytosis   (HFpEF) heart failure with preserved ejection fraction (HCC)   Adrenal insufficiency (Addison's disease) (HCC)   Hypertension associated with diabetes (HCC)   Hyperlipidemia associated with type 2 diabetes mellitus (HCC)   Type 2 diabetes mellitus in patient with obesity (HCC)   Depression with anxiety   Class 2 obesity   Obesity, Class III, BMI 40-49.9 (morbid obesity) (HCC)   Anemia of chronic disease  Resolved Problems:   * No resolved hospital problems. *  Hospital Course: Sarah [name] is a 73 y.o. 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   Patient states that she has left leg pain, erythema and swelling for more than 3 days, which has been progressively worsening. It involves the whole left leg from foot to upper thigh.  The pain is constant, aching, moderate, nonradiating, not aggravated or alleviated by known factors.  No recent long distant traveling.  Patient has SOB, no cough, chest pain.  No fever or chills.  Patient does not have nausea, vomiting, diarrhea or abdominal pain.  No symptoms of UTI.  No rectal bleeding or dark stool.  No recent fall or head injury. Lower extremity venous Doppler of left leg:  1. Extensive acute appearing deep venous thrombosis throughout the entirety of the left lower extremity  as above. CT angiogram negative for PE   Assessment and Plan:  DVT (deep venous thrombosis)_left leg extensive DVT:  Lower extremity venous Doppler showed extensive acute appearing deep venous thrombosis throughout the entirety of the left lower extremity. CTA negative for PE.  Has significant left leg edema, no shortness of breath or chest pain. Patient was initially placed on IV heparin , thrombectomy performed on 10/20.  Changed to Eliquis . Patient seen by PT/OT, recommending nursing placement For discharge to skilled nursing facility today   Chronic (HFpEF) heart failure with preserved ejection fraction (HCC): 2D echo on 09/05/2023 showed EF of 55-60% with grade 2 diastolic dysfunction.  BNP normal 38.3.   Continue losartan  and amlodipine    Depression with anxiety -Amitriptyline    Hypertension associated with diabetes (HCC) Continue home medicines.   Hyperlipidemia associated with type 2 diabetes mellitus (HCC) -Crestor    Adrenal insufficiency (Addison's disease) (HCC):  Continue oral hydrocortisone .   Type 2 diabetes mellitus in patient with obesity [facility]):  Continue long-acting insulin  and metformin     Morbid obesity (BMI 42.50):  Complicates overall prognosis and care Diet and exercise     Anemia of chronic disease H&H is stable. Monitor closely while on anticoagulation       Consultants: Vascular surgery Procedures performed: Thrombectomy Disposition: Skilled nursing facility Diet recommendation:  Cardiac and Carb modified diet DISCHARGE MEDICATION: Allergies as of 11/10/2023       Reactions   Clindamycin/lincomycin Anaphylaxis   Doxycycline  Rash   Aspirin  Other (See Comments)   Pt is unsure of reaction, but states that she is allergic to aspirin   Ciprofloxacin Rash   very bad rash   Penicillins Rash   Has patient had a PCN reaction causing immediate rash, facial/tongue/throat swelling, SOB or lightheadedness with hypotension: No Has patient had  a PCN reaction causing severe rash involving mucus membranes or skin necrosis: No Has patient had a PCN reaction that required hospitalization: No Has patient had a PCN reaction occurring within the last 10 years: No If all of the above answers are NO, then may proceed with Cephalosporin use.        Medication List     STOP taking these medications    Solu-CORTEF  100 MG injection Generic drug: hydrocortisone  sodium succinate        TAKE these medications    albuterol  (2.5 MG/3ML) 0.083% nebulizer solution Commonly known as: PROVENTIL  Inhale 3 mLs (2.5 mg total) into the lungs every 4 (four) hours as needed for wheezing or shortness of breath.   amitriptyline  50 MG tablet Commonly known as: ELAVIL  Take 1 tablet (50 mg total) by mouth at bedtime.   amLODipine  2.5 MG tablet Commonly known as: NORVASC  Take 1 tablet (2.5 mg total) by mouth daily.   apixaban 5 MG Tabs tablet Commonly known as: ELIQUIS Take 2 tablets (10 mg total) by mouth 2 (two) times daily for 3 days, THEN 1 tablet (5 mg total) 2 (two) times daily for 27 days. Start taking on: November 10, 2023   Belsomra  15 MG Tabs Generic drug: Suvorexant  Take 1 tablet (15 mg total) by mouth at bedtime as needed.   EPINEPHrine  0.3 mg/0.3 mL Soaj injection Commonly known as: EpiPen  2-Pak Inject 0.3 mg into the muscle as needed for anaphylaxis.   FIBER ADULT GUMMIES PO Take 1 tablet by mouth daily as needed.   furosemide  40 MG tablet Commonly known as: LASIX  TAKE 1 TABLET BY MOUTH EVERY DAY   hydrocortisone  20 MG tablet Commonly known as: CORTEF  Take 1 tablet (20 mg total) by mouth 2 (two) times daily. What changed:  medication strength how much to take how to take this when to take this additional instructions   insulin  glargine-yfgn 100 UNIT/ML Pen Commonly known as: SEMGLEE Inject 8 Units into the skin daily. Start taking on: November 11, 2023   losartan  50 MG tablet Commonly known as: COZAAR  Take  50 mg by mouth daily.   meclizine  25 MG tablet Commonly known as: ANTIVERT  Take 1 tablet (25 mg total) by mouth 3 (three) times daily as needed for dizziness.   metFORMIN  1000 MG tablet Commonly known as: GLUCOPHAGE  Take 1 tablet (1,000 mg total) by mouth 2 (two) times daily with a meal.   OneTouch UltraSoft 2 Lancets Misc USE TO CHECK BLOOD SUGAR 3 TIMES A DAY AND DOCUMENT RESULTS, BRING TO APPOINTMENTS. GOAL IS <130 FASTING BLOOD SUGAR AND <180 TWO HOURS AFTER MEALS.   OneTouch Verio w/Device Kit Use to check blood sugar 3 times a day and document results, bring to appointments.  Goal is <130 fasting blood sugar and <180 two hours after meals.   pantoprazole  20 MG tablet Commonly known as: Protonix  Take 1 tablet (20 mg total) by mouth daily.   pregabalin  150 MG capsule Commonly known as: LYRICA  Take 1 capsule (150 mg total) by mouth 2 (two) times daily AND 2 capsules (300 mg total) at bedtime. What changed: See the new instructions.   rosuvastatin  10 MG tablet Commonly known as: CRESTOR  Take 1 tablet (10 mg total) by mouth daily.   senna-docusate 8.6-50 MG tablet Commonly known as: Senokot-S  Take 2 tablets by mouth 2 (two) times daily.   VITAMIN B-12 PO Take 1 tablet by mouth at bedtime.   Vitamin D  50 MCG (2000 UT) Caps Take 1 capsule by mouth at bedtime.        Contact information for after-discharge care     Destination     [facility] .   Service: Skilled Nursing Contact information: [address] [place]  [phone]                    Discharge Exam: Sarah [name]   11/08/23 0500 11/09/23 0138 11/10/23 0500  Weight: 110.8 kg 112.6 kg 112.3 kg   General exam: Appears calm and comfortable  Respiratory system: Clear to auscultation. Respiratory effort normal. Cardiovascular system: S1 & S2 heard, RRR. No JVD, murmurs, rubs, gallops or clicks. Gastrointestinal system: Abdomen is nondistended, soft and nontender.  No organomegaly or masses felt. Normal bowel sounds heard. Central nervous system: Alert and oriented. No focal neurological deficits. Extremities: Edema improving.. Skin: No rashes, lesions or ulcers Psychiatry: Judgement and insight appear normal. Mood & affect appropriate.  Condition at discharge: stable  The results of significant diagnostics from this hospitalization (including imaging, microbiology, ancillary and laboratory) are listed below for reference.   Imaging Studies: PERIPHERAL VASCULAR CATHETERIZATION Result Date: 11/08/2023 See surgical note for result.  CT Angio Chest PE W/Cm &/Or Wo Cm Result Date: 11/05/2023 EXAM: CTA CHEST 11/05/2023 06:04:06 PM TECHNIQUE: CTA of the chest was performed with and without the administration of 50 mL of intravenous contrast. Multiplanar reformatted images are provided for review. MIP images are provided for review. Automated exposure control, iterative reconstruction, and/or weight based adjustment of the mA/kV was utilized to reduce the radiation dose to as low as reasonably achievable. COMPARISON: Chest x-ray and CT 09/04/2023. CLINICAL HISTORY: Pulmonary embolism (PE) suspected, low to intermediate prob, positive D-dimer. Pt comes with sob since Tuesday and sever swelling in left leg. Pt states wheeping from leg too. Pt states more exertion when walking. FINDINGS: PULMONARY ARTERIES: Pulmonary arteries are adequately opacified for evaluation. No acute pulmonary embolus. Main pulmonary artery is normal in caliber. MEDIASTINUM: Coronary artery and aortic atherosclerotic calcification. The heart and pericardium demonstrate no acute abnormality. There is no acute abnormality of the thoracic aorta. LYMPH NODES: No mediastinal, hilar or axillary lymphadenopathy. LUNGS AND PLEURA: The lungs are without acute process. No focal consolidation or pulmonary edema. No evidence of pleural effusion or pneumothorax. UPPER ABDOMEN: Limited images of the upper  abdomen demonstrate hepatic steatosis. Otherwise, the visualized portions are unremarkable. SOFT TISSUES AND BONES: No acute bone or soft tissue abnormality. IMPRESSION: 1. No pulmonary embolism. Electronically signed by: Norman Gatlin MD 11/05/2023 06:39 PM EDT RP Workstation: [id number]   US  Venous Img Lower Unilateral Left Result Date: 11/05/2023 CLINICAL DATA:  Left lower extremity edema for 4 days EXAM: LEFT LOWER EXTREMITY VENOUS DOPPLER ULTRASOUND TECHNIQUE: Gray-scale sonography with graded compression, as well as color Doppler and duplex ultrasound were performed to evaluate the lower extremity deep venous systems from the level of the common femoral vein and including the common femoral, femoral, profunda femoral, popliteal and calf veins including the posterior tibial, peroneal and gastrocnemius veins when visible. The superficial great saphenous vein was also interrogated. Spectral Doppler was utilized to evaluate flow at rest and with distal augmentation maneuvers in the common femoral, femoral and popliteal veins. COMPARISON:  None Available. FINDINGS: Contralateral Common Femoral Vein: Respiratory phasicity is normal and symmetric with  the symptomatic side. No evidence of thrombus. Normal compressibility. Common Femoral Vein: Occlusive thrombus. The vessel is noncompressible with no Doppler flow detected. Saphenofemoral Junction: Occlusive thrombus with no Doppler flow detected. Profunda Femoral Vein: Occlusive thrombus. The vessel is noncompressible with no Doppler flow detected. Femoral Vein: Occlusive thrombus. The vessel is noncompressible with no Doppler flow detected. Popliteal Vein: Occlusive thrombus, the vessel is noncompressible with no Doppler flow detected. Calf Veins: No evidence of thrombus, though evaluation is limited due to subcutaneous edema. Superficial Great Saphenous Vein: Near occlusive thrombus is identified, with only a small segment of the saphenous vein demonstrating  Doppler flow. Other Findings:  None. IMPRESSION: 1. Extensive acute appearing deep venous thrombosis throughout the entirety of the left lower extremity as above. Critical Value/emergent results were called by telephone at the time of interpretation on 11/05/2023 at 5:55 pm to provider LORELLE CHEADLE, who verbally acknowledged these results. Electronically Signed   By: Ozell Daring M.D.   On: 11/05/2023 17:59   DG Chest 2 View Result Date: 11/05/2023 CLINICAL DATA:  Shortness of breath. EXAM: CHEST - 2 VIEW COMPARISON:  09/14/2023. FINDINGS: Low lung volume. Bilateral lung fields are clear. Bilateral costophrenic angles are clear. Stable cardio-mediastinal silhouette. No acute osseous abnormalities. The soft tissues are within normal limits. IMPRESSION: No active cardiopulmonary disease. Electronically Signed   By: Ree Molt M.D.   On: 11/05/2023 15:04    Microbiology: Results for orders placed or performed during the hospital encounter of 11/05/23  Culture, blood (Routine X 2) w Reflex to ID Panel     Status: None (Preliminary result)   Collection Time: 11/05/23 11:28 PM   Specimen: BLOOD  Result Value Ref Range Status   Specimen Description BLOOD BLOOD RIGHT ARM  Final   Special Requests   Final    BOTTLES DRAWN AEROBIC AND ANAEROBIC Blood Culture adequate volume   Culture   Final    NO GROWTH 4 DAYS Performed at [facility], [address] [place], [place] [address]    Report Status PENDING  Incomplete  Culture, blood (Routine X 2) w Reflex to ID Panel     Status: None (Preliminary result)   Collection Time: 11/05/23 11:30 PM   Specimen: BLOOD  Result Value Ref Range Status   Specimen Description BLOOD BLOOD LEFT HAND  Final   Special Requests   Final    BOTTLES DRAWN AEROBIC AND ANAEROBIC Blood Culture results may not be optimal due to an excessive volume of blood received in culture bottles   Culture   Final    NO GROWTH 4 DAYS Performed at [facility], [address] [place], [place] [address]    Report Status PENDING  Incomplete    Labs: CBC: Recent Labs  Lab 11/06/23 0427 11/07/23 0429 11/08/23 0527 11/09/23 0417 11/10/23 0519  WBC 10.3 10.5 8.6 8.9 10.2  HGB 10.1* 9.9* 9.6* 8.8* 9.0*  HCT 30.4* 30.5* 29.5* 26.9* 28.1*  MCV 86.4 88.4 87.5 87.6 88.4  PLT 183 202 214 232 240   Basic Metabolic Panel: Recent Labs  Lab 11/05/23 1514 11/06/23 0427 11/08/23 0527  NA 135 136 137  K 3.8 3.8 3.8  CL 99 100 100  CO2 24 24 26   GLUCOSE 196* 274* 300*  BUN 19 17 24*  CREATININE 0.94 0.82 0.79  CALCIUM  8.8* 8.5* 8.5*  MG  --   --  1.9   Liver Function Tests: Recent Labs  Lab 11/08/23 0527  AST 13*  ALT 12  ALKPHOS 49  BILITOT 0.3  PROT 5.9*  ALBUMIN 2.6*   CBG: Recent Labs  Lab 11/09/23 1147 11/09/23 1657 11/09/23 1710 11/09/23 2146 11/10/23 0809  GLUCAP 362* 148* 140* 155* 170*    Discharge time spent: greater than 30 minutes.  Signed: Aimee Somerset, MD [facility] 11/10/2023

## 2023-11-10 NOTE — TOC Transition Note (Signed)
 Transition of Care [facility]) - Discharge Note   Patient Details  Name: Turner Kunzman MRN: [id number] Date of Birth: 20-Sep-1950  [facility]) CM/SW Contact:  Corean [name], RN Phone Number: 11/10/2023, 1:00 PM   Clinical Narrative:      [name] approved for [facility] received [id number]    Patient will DC to [facility] Anticipated DC date:11/10/23  Transport by: Deanie Iha   Per MD patient ready for DC to . RN, patient, patient's family, and facility notified of DC. Discharge Summary sent to facility. RN given number for report. DC packet on chart. TOC signing off.    Patient Goals and CMS Choice            Discharge Placement                       Discharge Plan and Services Additional resources added to the After Visit Summary for                                       Social Drivers of Health (SDOH) Interventions SDOH Screenings   Food Insecurity: No Food Insecurity (11/06/2023)  Housing: Low Risk  (11/06/2023)  Transportation Needs: No Transportation Needs (11/07/2023)  Utilities: Not At Risk (11/07/2023)  Alcohol Screen: Low Risk  (09/29/2022)  Depression (PHQ2-9): Medium Risk (09/14/2023)  Financial Resource Strain: Low Risk  (10/24/2023)   Received from [facility]  Physical Activity: Insufficiently Active (09/29/2022)  Social Connections: Moderately Integrated (11/07/2023)  Recent Concern: Social Connections - Moderately Isolated (09/05/2023)  Stress: No Stress Concern Present (09/29/2022)  Tobacco Use: Low Risk  (11/05/2023)  Health Literacy: Adequate Health Literacy (09/29/2022)     Readmission Risk Interventions     No data to display

## 2023-11-10 NOTE — TOC PASRR Note (Signed)
 RE: Sarah [name] Date of Birth: 2050/05/31 Date: 11/10/23      To Whom It May Concern:   Please be advised that the above-named patient will require a short-term nursing home stay - anticipated 30 days or less for rehabilitation and strengthening.  The plan is for return home

## 2023-11-10 NOTE — TOC Progression Note (Addendum)
 Transition of Care [facility]) - Progression Note    Patient Details  Name: Sarah [name] MRN: [id number] Date of Birth: 31-Dec-1950  Transition of Care [facility]) CM/SW Contact  Sarah [name], RN Phone Number: 11/10/2023, 10:11 AM  Clinical Narrative:     [name] approved for [facility] and they can admit today pending PASRR returns Dc information sent to [facility] in [place] Additional clinical for PASRR sent to MD to sign       1115am additional clinical uploaded to [facility]              Expected Discharge Plan and Services         Expected Discharge Date: 11/10/23                                     Social Drivers of Health (SDOH) Interventions SDOH Screenings   Food Insecurity: No Food Insecurity (11/06/2023)  Housing: Low Risk  (11/06/2023)  Transportation Needs: No Transportation Needs (11/07/2023)  Utilities: Not At Risk (11/07/2023)  Alcohol Screen: Low Risk  (09/29/2022)  Depression (PHQ2-9): Medium Risk (09/14/2023)  Financial Resource Strain: Low Risk  (10/24/2023)   Received from [facility]  Physical Activity: Insufficiently Active (09/29/2022)  Social Connections: Moderately Integrated (11/07/2023)  Recent Concern: Social Connections - Moderately Isolated (09/05/2023)  Stress: No Stress Concern Present (09/29/2022)  Tobacco Use: Low Risk  (11/05/2023)  Health Literacy: Adequate Health Literacy (09/29/2022)    Readmission Risk Interventions     No data to display

## 2023-11-10 NOTE — Progress Notes (Unsigned)
   11/10/2023  Patient ID: Sarah [name], female   DOB: [birth year], 73 y.o.   MRN: [id number]  This patient is appearing on a report for being at risk of failing the adherence measure for diabetes medications this calendar year.   Medication: metformin  1000 mg tablets Last fill date: 07/02/23 for 90 day supply  MyChart message sent to patient.   Sarah [name] C. Everli Rother [facility] PharmD Candidate Class of [id number]

## 2023-11-11 ENCOUNTER — Encounter: Payer: Self-pay | Admitting: Nurse Practitioner

## 2023-11-11 ENCOUNTER — Non-Acute Institutional Stay (SKILLED_NURSING_FACILITY): Payer: Self-pay | Admitting: Nurse Practitioner

## 2023-11-11 ENCOUNTER — Ambulatory Visit: Admitting: Nurse Practitioner

## 2023-11-11 DIAGNOSIS — I82412 Acute embolism and thrombosis of left femoral vein: Secondary | ICD-10-CM

## 2023-11-11 DIAGNOSIS — F99 Mental disorder, not otherwise specified: Secondary | ICD-10-CM

## 2023-11-11 DIAGNOSIS — K219 Gastro-esophageal reflux disease without esophagitis: Secondary | ICD-10-CM

## 2023-11-11 DIAGNOSIS — E119 Type 2 diabetes mellitus without complications: Secondary | ICD-10-CM

## 2023-11-11 DIAGNOSIS — I5032 Chronic diastolic (congestive) heart failure: Secondary | ICD-10-CM | POA: Diagnosis not present

## 2023-11-11 DIAGNOSIS — F3341 Major depressive disorder, recurrent, in partial remission: Secondary | ICD-10-CM | POA: Diagnosis not present

## 2023-11-11 DIAGNOSIS — F5105 Insomnia due to other mental disorder: Secondary | ICD-10-CM

## 2023-11-11 DIAGNOSIS — G6289 Other specified polyneuropathies: Secondary | ICD-10-CM

## 2023-11-11 DIAGNOSIS — E669 Obesity, unspecified: Secondary | ICD-10-CM

## 2023-11-11 DIAGNOSIS — I10 Essential (primary) hypertension: Secondary | ICD-10-CM

## 2023-11-11 DIAGNOSIS — E271 Primary adrenocortical insufficiency: Secondary | ICD-10-CM | POA: Diagnosis not present

## 2023-11-11 DIAGNOSIS — E785 Hyperlipidemia, unspecified: Secondary | ICD-10-CM

## 2023-11-11 LAB — CULTURE, BLOOD (ROUTINE X 2)
Culture: NO GROWTH
Culture: NO GROWTH
Special Requests: ADEQUATE

## 2023-11-11 MED ORDER — BELSOMRA 15 MG PO TABS: 15.0000 mg | ORAL_TABLET | Freq: Every evening | ORAL | 0 refills | Status: DC | PRN

## 2023-11-11 MED ORDER — PREGABALIN 150 MG PO CAPS: ORAL_CAPSULE | ORAL | 0 refills | Status: DC

## 2023-11-11 NOTE — Progress Notes (Signed)
 Location:  Other [facility].  Nursing Home Room Number: [facility] Place of Service:  SNF [phone]) Harlene An, NP  PCP: Valerio Melanie DASEN, NP  Patient Care Team: Valerio Melanie DASEN, NP as PCP - General (Nurse Practitioner) [facility] (Optometry) Danis, Victory LITTIE MOULD, MD as Consulting Physician (Gastroenterology) Phebe Janina Pates, MD as Referring Physician (Endocrinology) Simers, Saralyn Caldron, NP as Nurse Practitioner (Neurology) [name], NP as Nurse Practitioner (Behavioral Health) [name], MD (Dermatology)  Extended Emergency Contact Information Primary Emergency Contact: Willard,Kimberly  [place] Home Phone: [phone] Relation: Daughter Secondary Emergency Contact: Willard,Jay Mobile Phone: [phone] Relation: Other  Goals of care: Advanced Directive information    11/11/2023   11:26 AM  Advanced Directives  Does Patient Have a Medical Advance Directive? No  Would patient like information on creating a medical advance directive? No - Patient declined     Chief Complaint  Patient presents with   Hospitalization Follow-up    Hospital Follow up    HPI:  Pt is a 73 y.o. female seen today for hospital follow up The patient is a 73 year old with congestive heart failure and adrenal insufficiency who presents for a hospital follow-up after treatment for a blood clot.  She was recently hospitalized due to left leg pain and swelling, which was attributed to an extensive blood clot. The clot was treated with heparin  and a thrombectomy on November 08, 2023, followed by a transition to Eliquis, which she is currently taking twice daily. She continues to experience leg swelling and pain, describing it as feeling like her leg is 'on fire' near the ankles. The swelling has exacerbated her neuropathy, which she describes as 'awful'.  She has a history of congestive heart failure and was told her  echocardiogram showed an ejection fraction of 55-60%.   Her blood pressure has been well controlled with losartan  50 mg and amlodipine  2.5 mg, although it was elevated during her hospital stay and upon arrival at the current facility. She experiences headache and dizziness, particularly when getting up quickly.  Her diabetes management was adjusted during the hospital stay, adding Lantus 8 units daily to her existing metformin  regimen. Her A1c was 6.9 in August, but her blood sugars were elevated during hospitalization.  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 She is on hydrocortisone  (Cortef ) 20 mg twice daily, which was increased from a lower dose last spring. She follows up with an endocrinologist for this condition.  She takes Lyrica  150 mg twice daily and 300 mg at bedtime for neuropathy, but has not received it at the current facility, exacerbating her symptoms.'   She also takes pantoprazole  for acid reflux, which is typically well controlled.  No family history of blood clots.  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)    PMS (premenstrual syndrome)    Past Surgical History:  Procedure Laterality Date   BREAST BIOPSY Right    benign   CHOLECYSTECTOMY     COLONOSCOPY  11/08/2018   Danis-polyps   COLONOSCOPY  12/05/2019   Danis   DILATION AND CURETTAGE OF UTERUS     FOOT SURGERY Right 2018   removal of melanoma   LUMBAR FUSION      lymph node removal Right 2018   groin    PERIPHERAL VASCULAR THROMBECTOMY Left 11/08/2023   Procedure: PERIPHERAL VASCULAR THROMBECTOMY;  Surgeon: Marea Selinda RAMAN, MD;  Location: [facility] INVASIVE CV LAB;  Service: Cardiovascular;  Laterality: Left;   POLYPECTOMY     TOTAL ABDOMINAL HYSTERECTOMY W/ BILATERAL SALPINGOOPHORECTOMY     TUBAL LIGATION      Allergies  Allergen Reactions   Clindamycin/Lincomycin Anaphylaxis   Doxycycline  Rash   Aspirin  Other (See Comments)    Pt is unsure of reaction, but states that she is allergic to aspirin    Ciprofloxacin Rash    very bad rash   Penicillins Rash    Has patient had a PCN reaction causing immediate rash, facial/tongue/throat swelling, SOB or lightheadedness with hypotension: No Has patient had a PCN reaction causing severe rash involving mucus membranes or skin necrosis: No Has patient had a PCN reaction that required hospitalization: No Has patient had a PCN reaction occurring within the last 10 years: No If all of the above answers are NO, then may proceed with Cephalosporin use.     Outpatient Encounter Medications as of 11/11/2023  Medication Sig   albuterol  (PROVENTIL ) (2.5 MG/3ML) 0.083% nebulizer solution Inhale 3 mLs (2.5 mg total) into the lungs every 4 (four) hours as needed for wheezing or shortness of breath.   amitriptyline  (ELAVIL ) 50 MG tablet Take 1 tablet (50 mg total) by mouth at bedtime.   amLODipine  (NORVASC ) 2.5 MG tablet Take 1 tablet (2.5 mg total) by mouth daily.   apixaban (ELIQUIS) 5 MG TABS tablet Take 2 tablets (10 mg total) by mouth 2 (two) times daily for 3 days, THEN 1 tablet (5 mg total) 2 (two) times daily for 27 days.   Blood Glucose Monitoring Suppl (ONETOUCH VERIO) w/Device KIT Use to check blood sugar 3 times a day and document results, bring to appointments.  Goal is <130 fasting blood sugar and <180 two hours after meals.   Cholecalciferol (VITAMIN D ) 50 MCG (2000 UT) CAPS Take 1 capsule by mouth at  bedtime.   Cyanocobalamin (VITAMIN B-12 PO) Take 1 tablet by mouth at bedtime.   EPINEPHrine  (EPIPEN  2-PAK) 0.3 mg/0.3 mL IJ SOAJ injection Inject 0.3 mg into the muscle as needed for anaphylaxis.   FIBER ADULT GUMMIES PO Take 1 tablet by mouth daily as needed.   furosemide  (LASIX ) 40 MG tablet TAKE 1 TABLET BY MOUTH EVERY DAY   hydrocortisone  (CORTEF ) 20 MG tablet Take 1 tablet (20 mg total) by mouth 2 (two) times daily.   insulin  glargine (LANTUS) 100 UNIT/ML Solostar Pen Inject 8 Units into the skin daily.   losartan  (COZAAR ) 50 MG tablet Take 50 mg by mouth daily.   meclizine  (ANTIVERT ) 25 MG tablet Take 1 tablet (25 mg total) by mouth 3 (three) times daily as needed for dizziness.   metFORMIN  (GLUCOPHAGE ) 1000 MG tablet Take 1 tablet (1,000 mg total) by mouth 2 (two) times daily with a meal.   OneTouch UltraSoft 2 Lancets MISC USE TO CHECK BLOOD SUGAR 3 TIMES  A DAY AND DOCUMENT RESULTS, BRING TO APPOINTMENTS. GOAL IS <130 FASTING BLOOD SUGAR AND <180 TWO HOURS AFTER MEALS.   pantoprazole  (PROTONIX ) 20 MG tablet Take 1 tablet (20 mg total) by mouth daily.   pregabalin  (LYRICA ) 150 MG capsule Take 1 capsule (150 mg total) by mouth 2 (two) times daily AND 2 capsules (300 mg total) at bedtime.   rosuvastatin  (CRESTOR ) 10 MG tablet Take 1 tablet (10 mg total) by mouth daily.   senna-docusate (SENOKOT-S) 8.6-50 MG tablet Take 2 tablets by mouth 2 (two) times daily.   Suvorexant  (BELSOMRA ) 15 MG TABS Take 1 tablet (15 mg total) by mouth at bedtime as needed.   [DISCONTINUED] pregabalin  (LYRICA ) 150 MG capsule Take 1 capsule (150 mg total) by mouth 2 (two) times daily AND 2 capsules (300 mg total) at bedtime.   [DISCONTINUED] Suvorexant  (BELSOMRA ) 15 MG TABS Take 1 tablet (15 mg total) by mouth at bedtime as needed.   No facility-administered encounter medications on file as of 11/11/2023.    Review of Systems  Constitutional:  Negative for activity change, appetite change, fatigue and unexpected  weight change.  HENT:  Negative for congestion and hearing loss.   Eyes: Negative.   Respiratory:  Negative for cough and shortness of breath.   Cardiovascular:  Positive for leg swelling. Negative for chest pain and palpitations.  Gastrointestinal:  Negative for abdominal pain, constipation and diarrhea.  Genitourinary:  Negative for difficulty urinating and dysuria.  Musculoskeletal:  Negative for arthralgias and myalgias.  Skin:  Negative for color change and wound.  Neurological:  Negative for dizziness and weakness.  Psychiatric/Behavioral:  Negative for agitation, behavioral problems and confusion.     Immunization History  Administered Date(s) Administered   Fluad Quad(high Dose 65+) 02/04/2021, 11/10/2021   Fluad Trivalent(High Dose 65+) 09/23/2022   INFLUENZA, HIGH DOSE SEASONAL PF 10/25/2023   Influenza Split 12/19/2010, 12/25/2011   Influenza,inj,Quad PF,6+ Mos 09/20/2014, 12/02/2015, 01/16/2019   Influenza-Unspecified 08/19/2017   PFIZER(Purple Top)SARS-COV-2 Vaccination 04/14/2019, 05/09/2019, 12/23/2019, 01/02/2020   Pneumococcal Conjugate-13 05/24/2015   Pneumococcal Polysaccharide-23 07/19/2009, 02/04/2021   Tdap 05/24/1992, 02/16/2013   Zoster, Live 02/28/2013   Pertinent  Health Maintenance Due  Topic Date Due   OPHTHALMOLOGY EXAM  04/19/2021   HEMOGLOBIN A1C  03/02/2024   Colonoscopy  03/19/2024   FOOT EXAM  09/13/2024   Mammogram  10/11/2024   DEXA SCAN  10/03/2026   Influenza Vaccine  Completed      09/23/2022   10:40 AM 09/29/2022    3:52 PM 12/30/2022    1:20 PM 09/14/2023   10:08 AM 10/13/2023    9:00 AM  Fall Risk  Falls in the past year? 0 1 0 1 1  Was there an injury with Fall? 0 0 0 0 0  Fall Risk Category Calculator 0 1 0 2 2  Patient at Risk for Falls Due to No Fall Risks History of fall(s) No Fall Risks History of fall(s) History of fall(s);Impaired mobility  Fall risk Follow up Falls evaluation completed Falls prevention discussed;Falls  evaluation completed;Education provided Falls evaluation completed Falls evaluation completed Falls evaluation completed   Functional Status Survey:    Vitals:   11/11/23 1121  BP: (!) 172/78  Pulse: 81  Resp: 18  Temp: (!) 97.3 F (36.3 C)  SpO2: 96%  Weight: 246 lb 9.6 oz (111.9 kg)  Height: 5' 4 (1.626 m)   Body mass index is 42.33 kg/m. Physical Exam Constitutional:      General: She  is not in acute distress.    Appearance: She is well-developed. She is not diaphoretic.  HENT:     Head: Normocephalic and atraumatic.     Mouth/Throat:     Pharynx: No oropharyngeal exudate.  Eyes:     Conjunctiva/sclera: Conjunctivae normal.     Pupils: Pupils are equal, round, and reactive to light.  Cardiovascular:     Rate and Rhythm: Normal rate and regular rhythm.     Heart sounds: Normal heart sounds.  Pulmonary:     Effort: Pulmonary effort is normal.     Breath sounds: Normal breath sounds.  Abdominal:     General: Bowel sounds are normal.     Palpations: Abdomen is soft.  Musculoskeletal:        General: No tenderness.     Cervical back: Normal range of motion and neck supple.     Right lower leg: Edema present.     Left lower leg: Edema present.  Skin:    General: Skin is warm and dry.  Neurological:     Mental Status: She is alert and oriented to person, place, and time.     Labs reviewed: Recent Labs    08/31/23 0514 09/01/23 0350 09/02/23 0316 09/04/23 1755 09/14/23 1209 09/15/23 0600 11/05/23 1514 11/06/23 0427 11/08/23 0527  NA 135 137 141   < > 143   < > 135 136 137  K 4.5 4.3 4.2   < > 3.7   < > 3.8 3.8 3.8  CL 105 104 105   < > 98   < > 99 100 100  CO2 20* 24 26   < > 30   < > 24 24 26   GLUCOSE 244* 161* 143*   < > 155*   < > 196* 274* 300*  BUN 30* 27* 28*   < > 20   < > 19 17 24*  CREATININE 1.65* 1.18* 1.00   < > 1.77*   < > 0.94 0.82 0.79  CALCIUM  8.0* 8.2* 8.3*   < > 9.1   < > 8.8* 8.5* 8.5*  MG 1.7 2.1 2.2  --  1.6*  --   --   --  1.9   PHOS 3.1 2.5 2.3*  --   --   --   --   --   --    < > = values in this interval not displayed.   Recent Labs    09/16/23 0641 10/13/23 0905 11/08/23 0527  AST 12* 5 13*  ALT 14 9 12   ALKPHOS 49 76 49  BILITOT 0.6 0.3 0.3  PROT 5.8* 5.5* 5.9*  ALBUMIN 3.1* 3.8 2.6*   Recent Labs    09/06/23 0534 09/07/23 0530 09/14/23 1045 09/23/23 0000 11/05/23 1514 11/08/23 0527 11/09/23 0417 11/10/23 0519  WBC 13.7* 11.4*   < > 8.1   < > 8.6 8.9 10.2  NEUTROABS 10.3* 8.5*  --  3,451.00  --   --   --   --   HGB 9.8* 9.6*   < > 12.2   < > 9.6* 8.8* 9.0*  HCT 29.8* 29.7*   < > 38   < > 29.5* 26.9* 28.1*  MCV 88.4 88.1   < >  --    < > 87.5 87.6 88.4  PLT 183 184   < > 219   < > 214 232 240   < > = values in this interval not displayed.   Lab Results  Component Value  Date   TSH 0.736 09/04/2023   Lab Results  Component Value Date   HGBA1C 6.9 (H) 08/31/2023   Lab Results  Component Value Date   CHOL 136 10/13/2023   HDL 57 10/13/2023   LDLCALC 46 10/13/2023   TRIG 206 (H) 10/13/2023   CHOLHDL 4.1 04/23/2020    Significant Diagnostic Results in last 30 days:  PERIPHERAL VASCULAR CATHETERIZATION Result Date: 11/08/2023 See surgical note for result.  CT Angio Chest PE W/Cm &/Or Wo Cm Result Date: 11/05/2023 EXAM: CTA CHEST 11/05/2023 06:04:06 PM TECHNIQUE: CTA of the chest was performed with and without the administration of 50 mL of intravenous contrast. Multiplanar reformatted images are provided for review. MIP images are provided for review. Automated exposure control, iterative reconstruction, and/or weight based adjustment of the mA/kV was utilized to reduce the radiation dose to as low as reasonably achievable. COMPARISON: Chest x-ray and CT 09/04/2023. CLINICAL HISTORY: Pulmonary embolism (PE) suspected, low to intermediate prob, positive D-dimer. Pt comes with sob since Tuesday and sever swelling in left leg. Pt states wheeping from leg too. Pt states more exertion when  walking. FINDINGS: PULMONARY ARTERIES: Pulmonary arteries are adequately opacified for evaluation. No acute pulmonary embolus. Main pulmonary artery is normal in caliber. MEDIASTINUM: Coronary artery and aortic atherosclerotic calcification. The heart and pericardium demonstrate no acute abnormality. There is no acute abnormality of the thoracic aorta. LYMPH NODES: No mediastinal, hilar or axillary lymphadenopathy. LUNGS AND PLEURA: The lungs are without acute process. No focal consolidation or pulmonary edema. No evidence of pleural effusion or pneumothorax. UPPER ABDOMEN: Limited images of the upper abdomen demonstrate hepatic steatosis. Otherwise, the visualized portions are unremarkable. SOFT TISSUES AND BONES: No acute bone or soft tissue abnormality. IMPRESSION: 1. No pulmonary embolism. Electronically signed by: Norman Gatlin MD 11/05/2023 06:39 PM EDT RP Workstation: [id number]   US  Venous Img Lower Unilateral Left Result Date: 11/05/2023 CLINICAL DATA:  Left lower extremity edema for 4 days EXAM: LEFT LOWER EXTREMITY VENOUS DOPPLER ULTRASOUND TECHNIQUE: Gray-scale sonography with graded compression, as well as color Doppler and duplex ultrasound were performed to evaluate the lower extremity deep venous systems from the level of the common femoral vein and including the common femoral, femoral, profunda femoral, popliteal and calf veins including the posterior tibial, peroneal and gastrocnemius veins when visible. The superficial great saphenous vein was also interrogated. Spectral Doppler was utilized to evaluate flow at rest and with distal augmentation maneuvers in the common femoral, femoral and popliteal veins. COMPARISON:  None Available. FINDINGS: Contralateral Common Femoral Vein: Respiratory phasicity is normal and symmetric with the symptomatic side. No evidence of thrombus. Normal compressibility. Common Femoral Vein: Occlusive thrombus. The vessel is noncompressible with no Doppler flow  detected. Saphenofemoral Junction: Occlusive thrombus with no Doppler flow detected. Profunda Femoral Vein: Occlusive thrombus. The vessel is noncompressible with no Doppler flow detected. Femoral Vein: Occlusive thrombus. The vessel is noncompressible with no Doppler flow detected. Popliteal Vein: Occlusive thrombus, the vessel is noncompressible with no Doppler flow detected. Calf Veins: No evidence of thrombus, though evaluation is limited due to subcutaneous edema. Superficial Great Saphenous Vein: Near occlusive thrombus is identified, with only a small segment of the saphenous vein demonstrating Doppler flow. Other Findings:  None. IMPRESSION: 1. Extensive acute appearing deep venous thrombosis throughout the entirety of the left lower extremity as above. Critical Value/emergent results were called by telephone at the time of interpretation on 11/05/2023 at 5:55 pm to provider LORELLE CHEADLE, who verbally acknowledged these results. Electronically Signed  By: Ozell Daring M.D.   On: 11/05/2023 17:59   DG Chest 2 View Result Date: 11/05/2023 CLINICAL DATA:  Shortness of breath. EXAM: CHEST - 2 VIEW COMPARISON:  09/14/2023. FINDINGS: Low lung volume. Bilateral lung fields are clear. Bilateral costophrenic angles are clear. Stable cardio-mediastinal silhouette. No acute osseous abnormalities. The soft tissues are within normal limits. IMPRESSION: No active cardiopulmonary disease. Electronically Signed   By: Ree Molt M.D.   On: 11/05/2023 15:04    Assessment/Plan No problem-specific Assessment & Plan notes found for this encounter.  1. 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  Rx provided today - Suvorexant  (BELSOMRA ) 15 MG TABS; Take 1 tablet (15 mg total) by mouth at bedtime as needed.  Dispense: 30 tablet; Refill: 0  2. Chronic heart failure with preserved ejection fraction (HCC) Heart failure with mildly reduced ejection fraction  of 55-60% and diastolic dysfunction. Managed with losartan  and amlodipine , emphasizing blood pressure control to manage symptoms. - Continue losartan  and amlodipine . - Monitor for symptoms of heart failure, such as shortness of breath or chest pain.  3. Recurrent major depression in partial remission Stable on amitriptyline    4. Adrenal insufficiency (Addison's disease) (HCC) Adrenal insufficiency managed with hydrocortisone  20 mg twice daily.  - Continue hydrocortisone  20 mg twice daily. - Follow up with endocrinologist for ongoing management.  5. Primary hypertension Elevated bp since she has been in facility htn generally well-controlled on losartan  and amlodipine , but recent readings elevated.  Current blood pressure is 149/77 mmHg. - Monitor blood pressure closely adjust medications if needed  6. Hyperlipidemia LDL goal <70 Continues on crestor .   7. Gastroesophageal reflux disease without esophagitis Well controlled on protonix   8. Axonal sensorimotor neuropathy Ongoing, did not have her lyrica  last night or today Rx sent to pharmacy  9. Diabetes  Diabetes management adjusted with addition of Lantus insulin  to regimen, previously included metformin . A1c was 6.9% in August, but blood sugars elevated during hospitalization, possibly due to stress. - Continue Lantus insulin  and metformin . - Monitor fasting blood sugars.  10. Extensive left lower extremity deep vein thrombosis, post-thrombectomy Hospitalized for extensive left lower extremity DVT, treated with thrombectomy, IV heparin , and transitioned to Eliquis. Significant swelling and pain persist. Etiology unclear of DVT; further investigation into genetic predispositions or malignancy recommended by PCP.  - Continue Eliquis twice daily for at least six months. - Monitor for signs of abnormal bruising or bleeding. - Discuss potential underlying causes of DVT with primary care provider for further workup. -continues here at  [facility] for short term rehab.   Alejah Aristizabal K. Caro BODILY [facility] [phone]

## 2023-11-14 NOTE — Patient Instructions (Signed)
 Be Involved in Caring For Your Health:  Taking Medications When medications are taken as directed, they can greatly improve your health. But if they are not taken as prescribed, they may not work. In some cases, not taking them correctly can be harmful. To help ensure your treatment remains effective and safe, understand your medications and how to take them. Bring your medications to each visit for review by your provider.  Your lab results, notes, and after visit summary will be available on My Chart. We strongly encourage you to use this feature. If lab results are abnormal the clinic will contact you with the appropriate steps. If the clinic does not contact you assume the results are satisfactory. You can always view your results on My Chart. If you have questions regarding your health or results, please contact the clinic during office hours. You can also ask questions on My Chart.  We at [facility] are grateful that you chose us  to provide your care. We strive to provide evidence-based and compassionate care and are always looking for feedback. If you get a survey from the clinic please complete this so we can hear your opinions.   Deep Vein Thrombosis  Deep vein thrombosis (DVT) is a condition in which a blood clot forms in a vein of the deep venous system. This can occur in the lower leg, thigh, pelvis, arm, or neck. A clot is blood that has thickened into a gel or solid. This condition is serious and can be life-threatening if the clot travels to the arteries of the lungs and causes a blockage (pulmonary embolism). A DVT can also damage veins in the leg, which can lead to long-term venous disease, leg pain, swelling, discoloration, and ulcers or sores (post-thrombotic syndrome). What are the causes? This condition may be caused by: A slowdown of blood flow. Damage to a vein. A condition that causes blood to clot more easily, such as certain bleeding disorders. What increases  the risk? The following factors may make you more likely to develop this condition: Obesity. Being older, especially older than age 11. Being inactive or not moving around (sedentary lifestyle). This may include: Sitting or lying down for longer than 4-6 hours other than to sleep at night. Being in the hospital, or having major or lengthy surgery. Having any recent bone injuries, such as breaks (fractures), that reduce movement, especially in the lower extremities. Having recent orthopedic surgery on the lower extremities. Being pregnant, giving birth, or having recently given birth. Taking medicines that contain estrogen, such as birth control or hormone replacement therapy. Using products that contain nicotine or tobacco, especially if you use hormonal birth control. Having a history of a blood vessel disease (peripheral vascular disease) or congestive heart disease. Having a history of cancer, especially if being treated with chemotherapy. What are the signs or symptoms? Symptoms of this condition include: Swelling, pain, pressure, or tenderness in an arm or a leg. An arm or a leg becoming warm, red, or discolored. A leg turning very pale or blue. You may have a large DVT. This is rare. If the clot is in your leg, you may notice that symptoms get worse when you stand or walk. In some cases, there are no symptoms. How is this diagnosed? This condition is diagnosed with: Your medical history and a physical exam. Tests, such as: Blood tests to check how well your blood clots. Doppler ultrasound. This is the best way to find a DVT. CT venogram. Contrast dye  is injected into a vein, and X-rays are taken to check for clots. This is helpful for veins in the chest or pelvis. How is this treated? Treatment for this condition depends on: The cause of your DVT. The size and location of your DVT, or having more than one DVT. Your risk for bleeding or developing more clots. Other medical  conditions you may have. Treatment may include: Taking a blood thinner medicine (anticoagulant) to prevent more clots from forming or current clots from growing. Wearing compression stockings. Injecting medicines into the affected vein to break up the clot (catheter-directed thrombolysis). Surgical procedures, when DVT is severe or hard to treat. These may be done to: Isolate and remove your clot. Place an inferior vena cava (IVC) filter. This filter is placed into a large vein called the inferior vena cava to catch blood clots before they reach your lungs. You may get some medical treatments for 6 months or longer. Follow these instructions at home: If you are taking blood thinners: Talk with your health care provider before you take any medicines that contain aspirin  or NSAIDs, such as ibuprofen. These medicines increase your risk for dangerous bleeding. Take your medicine exactly as told, at the same time every day. Do not skip a dose. Do not take more than the prescribed dose. This is important. Ask your health care provider about foods and medicines that could change or interact with the way your blood thinner works. Avoid these foods and medicines if you are told to do so. Avoid anything that may cause bleeding or bruising. You may bleed more easily while taking blood thinners. Be very careful when using knives, scissors, or other sharp objects. Use an electric razor instead of a blade. Avoid activities that could cause injury or bruising, and follow instructions for preventing falls. Tell your health care provider if you have had any internal bleeding, bleeding ulcers, or neurologic diseases, such as strokes or cerebral aneurysms. Wear a medical alert bracelet or carry a card that lists what medicines you take. General instructions Take over-the-counter and prescription medicines only as told by your health care provider. Return to your normal activities as told by your health care  provider. Ask your health care provider what activities are safe for you. If recommended, wear compression stockings as told by your health care provider. These stockings help to prevent blood clots and reduce swelling in your legs. Never wear your compression stockings while sleeping at night. Keep all follow-up visits. This is important. Where to find more information American Heart Association: www.heart.org Centers for Disease Control and Prevention: FootballExhibition.com.br National Heart, Lung, and Blood Institute: PopSteam.is Contact a health care provider if: You miss a dose of your blood thinner. You have unusual bruising or other color changes. You have new or worse pain, swelling, or redness in an arm or a leg. You have worsening numbness or tingling in an arm or a leg. You have a significant color change (pale or blue) in the extremity that has the DVT. Get help right away if: You have signs or symptoms that a blood clot has moved to the lungs. These may include: Shortness of breath. Chest pain. Fast or irregular heartbeats (palpitations). Light-headedness, dizziness, or fainting. Coughing up blood. You have signs or symptoms that your blood is too thin. These may include: Blood in your vomit, stool, or urine. A cut that will not stop bleeding. A menstrual period that is heavier than usual. A severe headache or confusion. These symptoms may  be an emergency. Get help right away. Call 911. Do not wait to see if the symptoms will go away. Do not drive yourself to the hospital. Summary Deep vein thrombosis (DVT) happens when a blood clot forms in a deep vein. This may occur in the lower leg, thigh, pelvis, arm, or neck. Symptoms affect the arm or leg and can include swelling, pain, tenderness, warmth, redness, or discoloration. This condition may be treated with medicines. In severe cases, a procedure or surgery may be done to remove or dissolve the clots. If you are taking blood  thinners, take them exactly as told. Do not skip a dose. Do not take more than is prescribed. Get help right away if you have a severe headache, shortness of breath, chest pain, fast or irregular heartbeats, or blood in your vomit, urine, or stool. This information is not intended to replace advice given to you by your health care provider. Make sure you discuss any questions you have with your health care provider. Document Revised: 07/29/2020 Document Reviewed: 07/29/2020 [employer] Patient Education  2024 [employer].

## 2023-11-15 ENCOUNTER — Ambulatory Visit: Admitting: Nurse Practitioner

## 2023-11-15 ENCOUNTER — Encounter: Payer: Self-pay | Admitting: Nurse Practitioner

## 2023-11-15 VITALS — BP 126/68 | HR 80 | Temp 98.4°F | Resp 14 | Ht 64.02 in | Wt 244.6 lb

## 2023-11-15 DIAGNOSIS — N179 Acute kidney failure, unspecified: Secondary | ICD-10-CM | POA: Diagnosis not present

## 2023-11-15 DIAGNOSIS — D638 Anemia in other chronic diseases classified elsewhere: Secondary | ICD-10-CM

## 2023-11-15 DIAGNOSIS — M25511 Pain in right shoulder: Secondary | ICD-10-CM | POA: Insufficient documentation

## 2023-11-15 DIAGNOSIS — I5032 Chronic diastolic (congestive) heart failure: Secondary | ICD-10-CM

## 2023-11-15 DIAGNOSIS — E66812 Obesity, class 2: Secondary | ICD-10-CM

## 2023-11-15 DIAGNOSIS — N189 Chronic kidney disease, unspecified: Secondary | ICD-10-CM

## 2023-11-15 DIAGNOSIS — I82412 Acute embolism and thrombosis of left femoral vein: Secondary | ICD-10-CM

## 2023-11-15 DIAGNOSIS — E669 Obesity, unspecified: Secondary | ICD-10-CM

## 2023-11-15 DIAGNOSIS — E119 Type 2 diabetes mellitus without complications: Secondary | ICD-10-CM

## 2023-11-15 DIAGNOSIS — Z6836 Body mass index (BMI) 36.0-36.9, adult: Secondary | ICD-10-CM

## 2023-11-15 DIAGNOSIS — D72829 Elevated white blood cell count, unspecified: Secondary | ICD-10-CM

## 2023-11-15 LAB — CBC
Hematocrit: 30 % — ABNORMAL LOW (ref 34.0–46.6)
Hemoglobin: 10.5 g/dL — ABNORMAL LOW (ref 11.1–15.9)
MCH: 30.2 pg (ref 26.6–33.0)
MCHC: 35 g/dL (ref 31.5–35.7)
MCV: 86 fL (ref 79–97)
Platelets: 218 x10E3/uL (ref 150–450)
RBC: 3.48 x10E6/uL — ABNORMAL LOW (ref 3.77–5.28)
RDW: 15 % (ref 11.7–15.4)
WBC: 10.4 x10E3/uL (ref 3.4–10.8)

## 2023-11-15 MED ORDER — PANTOPRAZOLE SODIUM 40 MG PO TBEC: 40.0000 mg | DELAYED_RELEASE_TABLET | Freq: Every day | ORAL | 3 refills | Status: DC

## 2023-11-15 MED ORDER — APIXABAN 5 MG PO TABS: 5.0000 mg | ORAL_TABLET | Freq: Two times a day (BID) | ORAL | 0 refills | Status: DC

## 2023-11-15 NOTE — Progress Notes (Signed)
 BP 126/68 (BP Location: Left Arm, Patient Position: Sitting, Cuff Size: Large)   Pulse 80   Temp 98.4 F (36.9 C) (Oral)   Resp 14   Ht 5' 4.02 (1.626 m)   Wt 244 lb 9.6 oz (110.9 kg)   SpO2 99%   BMI 41.96 kg/m    Subjective:    Patient ID: Sarah [name], female    DOB: 11/09/1950, 73 y.o.   MRN: [id number]  HPI: Sarah [name] is a 73 y.o. female  Chief Complaint  Patient presents with   Dizziness    Hasn't been to bad since last hospital visit.    Gastroesophageal Reflux   Fatigue    Currently in STR as [place] and goes home on Wednesday.    Transition of Care Hospital Follow up.  Was admitted to hospital on 11/05/23 for DVT left leg, extensive in nature.  Presented after prior admissions for pneumonia and weakness, 09/14/23, 09/04/23, and 08/30/23. Currently is at [facility] for rehab. Was started on Eliquis in the hospital and to follow-up with vascular. Thrombectomy performed on 10/20. Left leg continues to have swelling, she has not been wearing compression, but no further pain.  No further dizziness is present, gets up slowly.  Continues to feel fatigued. To return home on Wednesday.  Has some heart burn, takes Protonix . Last A1c in August was 6.9%.  Has been having right arm pain for 2 weeks. No swelling in arm, lots of bruising. Is hurting when she tries to lift arm up. Will hurt in elbow and to shoulder when tries to lift up. Was hurting in hospital too. Nurse at [facility] feels it is way patient sleeps. No recent injuries or falls.  Hospital Course: [name] is a 73 y.o. 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   Patient states that she has left leg pain, erythema and swelling for more than 3 days, which has been progressively worsening. It involves the whole left leg from foot  to upper thigh.  The pain is constant, aching, moderate, nonradiating, not aggravated or alleviated by known factors.  No recent long distant traveling.  Patient has SOB, no cough, chest pain.  No fever or chills.  Patient does not have nausea, vomiting, diarrhea or abdominal pain.  No symptoms of UTI.  No rectal bleeding or dark stool.  No recent fall or head injury.  Lower extremity venous Doppler of left leg:  1. Extensive acute appearing deep venous thrombosis throughout the entirety of the left lower extremity as above. CT angiogram negative for PE  Assessment and Plan:   DVT (deep venous thrombosis)_left leg extensive DVT:  Lower extremity venous Doppler showed extensive acute appearing deep venous thrombosis throughout the entirety of the left lower extremity. CTA negative for PE.  Has significant left leg edema, no shortness of breath or chest pain. Patient was initially placed on IV heparin , thrombectomy performed on 10/20.  Changed to Eliquis . Patient seen by PT/OT, recommending nursing placement For discharge to skilled nursing facility today   Chronic (HFpEF) heart failure with preserved ejection fraction (HCC): 2D echo on 09/05/2023 showed EF of 55-60% with grade 2 diastolic dysfunction.  BNP normal 38.3.   Continue losartan  and amlodipine    Depression with anxiety -Amitriptyline    Hypertension associated with diabetes (HCC) Continue home medicines.   Hyperlipidemia  associated with type 2 diabetes mellitus (HCC) -Crestor    Adrenal insufficiency (Addison's disease) (HCC):  Continue oral hydrocortisone .   Type 2 diabetes mellitus in patient with obesity (HCC):  Continue long-acting insulin  and metformin      Morbid obesity (BMI 42.50):  Complicates overall prognosis and care Diet and exercise     Anemia of chronic disease H&H is stable. Monitor closely while on anticoagulation   Hospital/Facility: [facility] D/C Physician: Dr. Lanetta D/C Date: 11/10/23  Records  Requested: 11/15/23 Records Received: 11/15/23 Records Reviewed: 11/15/23  Diagnoses on Discharge: DVT (deep venous thrombosis)_left leg extensive DVT   Date of interactive Contact within 48 hours of discharge:  Contact was through: none  Date of 7 day or 14 day face-to-face visit:   within 14 days  Outpatient Encounter Medications as of 11/15/2023  Medication Sig   albuterol  (PROVENTIL ) (2.5 MG/3ML) 0.083% nebulizer solution Inhale 3 mLs (2.5 mg total) into the lungs every 4 (four) hours as needed for wheezing or shortness of breath.   amitriptyline  (ELAVIL ) 50 MG tablet Take 1 tablet (50 mg total) by mouth at bedtime.   amLODipine  (NORVASC ) 2.5 MG tablet Take 1 tablet (2.5 mg total) by mouth daily.   apixaban (ELIQUIS) 5 MG TABS tablet Take 1 tablet (5 mg total) by mouth 2 (two) times daily.   Blood Glucose Monitoring Suppl (ONETOUCH VERIO) w/Device KIT Use to check blood sugar 3 times a day and document results, bring to appointments.  Goal is <130 fasting blood sugar and <180 two hours after meals.   Cholecalciferol (VITAMIN D ) 50 MCG (2000 UT) CAPS Take 1 capsule by mouth at bedtime.   Cyanocobalamin (VITAMIN B-12 PO) Take 1 tablet by mouth at bedtime.   EPINEPHrine  (EPIPEN  2-PAK) 0.3 mg/0.3 mL IJ SOAJ injection Inject 0.3 mg into the muscle as needed for anaphylaxis.   FIBER ADULT GUMMIES PO Take 1 tablet by mouth daily as needed.   furosemide  (LASIX ) 40 MG tablet TAKE 1 TABLET BY MOUTH EVERY DAY   hydrocortisone  (CORTEF ) 20 MG tablet Take 1 tablet (20 mg total) by mouth 2 (two) times daily.   insulin  glargine (LANTUS) 100 UNIT/ML Solostar Pen Inject 8 Units into the skin daily.   losartan  (COZAAR ) 50 MG tablet Take 50 mg by mouth daily.   meclizine  (ANTIVERT ) 25 MG tablet Take 1 tablet (25 mg total) by mouth 3 (three) times daily as needed for dizziness.   metFORMIN  (GLUCOPHAGE ) 1000 MG tablet Take 1 tablet (1,000 mg total) by mouth 2 (two) times daily with a meal.   OneTouch  UltraSoft 2 Lancets MISC USE TO CHECK BLOOD SUGAR 3 TIMES A DAY AND DOCUMENT RESULTS, BRING TO APPOINTMENTS. GOAL IS <130 FASTING BLOOD SUGAR AND <180 TWO HOURS AFTER MEALS.   pantoprazole  (PROTONIX ) 40 MG tablet Take 1 tablet (40 mg total) by mouth daily.   pregabalin  (LYRICA ) 150 MG capsule Take 1 capsule (150 mg total) by mouth 2 (two) times daily AND 2 capsules (300 mg total) at bedtime.   rosuvastatin  (CRESTOR ) 10 MG tablet Take 1 tablet (10 mg total) by mouth daily.   senna-docusate (SENOKOT-S) 8.6-50 MG tablet Take 2 tablets by mouth 2 (two) times daily.   Suvorexant  (BELSOMRA ) 15 MG TABS Take 1 tablet (15 mg total) by mouth at bedtime as needed.   [DISCONTINUED] apixaban (ELIQUIS) 5 MG TABS tablet Take 2 tablets (10 mg total) by mouth 2 (two) times daily for 3 days, THEN 1 tablet (5 mg total) 2 (two) times daily for  27 days.   [DISCONTINUED] pantoprazole  (PROTONIX ) 20 MG tablet Take 1 tablet (20 mg total) by mouth daily.   No facility-administered encounter medications on file as of 11/15/2023.   Diagnostic Tests Reviewed/Disposition:      Latest Ref Rng & Units 11/15/2023    3:02 PM 11/10/2023    5:19 AM 11/09/2023    4:17 AM  CBC  WBC 3.4 - 10.8 x10E3/uL 10.4  10.2  8.9   Hemoglobin 11.1 - 15.9 g/dL 89.4  9.0  8.8   Hematocrit 34.0 - 46.6 % 30.0  28.1  26.9   Platelets 150 - 450 x10E3/uL 218  240  232     CMP     Component Value Date/Time   NA 137 11/08/2023 0527   NA 134 10/13/2023 0905   NA 146 (H) 06/04/2011 0148   K 3.8 11/08/2023 0527   K 3.4 (L) 06/04/2011 0148   CL 100 11/08/2023 0527   CL 110 (H) 06/04/2011 0148   CO2 26 11/08/2023 0527   CO2 26 06/04/2011 0148   GLUCOSE 300 (H) 11/08/2023 0527   GLUCOSE 111 (H) 06/04/2011 0148   BUN 24 (H) 11/08/2023 0527   BUN 26 10/13/2023 0905   BUN 10 06/04/2011 0148   CREATININE 0.79 11/08/2023 0527   CREATININE 0.89 04/23/2020 1150   CALCIUM  8.5 (L) 11/08/2023 0527   CALCIUM  7.5 (L) 06/04/2011 0148   PROT 5.9 (L)  11/08/2023 0527   PROT 5.5 (L) 10/13/2023 0905   PROT 6.3 (L) 06/03/2011 0945   ALBUMIN 2.6 (L) 11/08/2023 0527   ALBUMIN 3.8 10/13/2023 0905   ALBUMIN 3.3 (L) 06/03/2011 0945   AST 13 (L) 11/08/2023 0527   AST 15 06/03/2011 0945   ALT 12 11/08/2023 0527   ALT 12 06/03/2011 0945   ALKPHOS 49 11/08/2023 0527   ALKPHOS 56 06/03/2011 0945   BILITOT 0.3 11/08/2023 0527   BILITOT 0.3 10/13/2023 0905   BILITOT 0.4 06/03/2011 0945   EGFR 52 (L) 10/13/2023 0905   GFRNONAA >60 11/08/2023 0527   GFRNONAA 67 04/23/2020 1150    Consults: Vascular  Discharge Instructions: Take anticoagulants as recommended Check blood sugars daily Return to the ER for evaluation of worsening symptoms  Disease/illness Education: Reviewed at length with patient and daughter  Home Health/Community Services Discussions/Referrals: Referral to home health OT and PT placed  Establishment or re-establishment of referral orders for community resources: none  Discussion with other health care providers: Reviewed all recent notes  Assessment and Support of treatment regimen adherence: Reviewed at length with patient and daughter  Appointments Coordinated with: Reviewed at length with patient and daughter  Education for self-management, independent living, and ADLs:  Reviewed at length with patient and daughter  Relevant past medical, surgical, family and social history reviewed and updated as indicated. Interim medical history since our last visit reviewed. Allergies and medications reviewed and updated.  Review of Systems  Constitutional:  Positive for fatigue. Negative for activity change, appetite change, diaphoresis and fever.  Respiratory:  Negative for cough, chest tightness, shortness of breath and wheezing.   Cardiovascular:  Positive for leg swelling (at baseline, wears compression). Negative for chest pain and palpitations.  Gastrointestinal: Negative.   Endocrine: Negative for polydipsia,  polyphagia and polyuria.  Musculoskeletal:  Positive for arthralgias.  Neurological:  Positive for weakness. Negative for dizziness, syncope, light-headedness, numbness and headaches.  Psychiatric/Behavioral: Negative.      Per HPI unless specifically indicated above     Objective:  BP 126/68 (BP Location: Left Arm, Patient Position: Sitting, Cuff Size: Large)   Pulse 80   Temp 98.4 F (36.9 C) (Oral)   Resp 14   Ht 5' 4.02 (1.626 m)   Wt 244 lb 9.6 oz (110.9 kg)   SpO2 99%   BMI 41.96 kg/m   Wt Readings from Last 3 Encounters:  11/15/23 244 lb 9.6 oz (110.9 kg)  11/11/23 246 lb 9.6 oz (111.9 kg)  11/10/23 247 lb 9.2 oz (112.3 kg)    Physical Exam Vitals and nursing note reviewed.  Constitutional:      General: She is awake. She is not in acute distress.    Appearance: She is well-developed and well-groomed. She is obese. She is not ill-appearing or toxic-appearing.  HENT:     Head: Normocephalic.     Right Ear: Hearing, ear canal and external ear normal.     Left Ear: Hearing, ear canal and external ear normal.  Eyes:     General: Lids are normal.        Right eye: No discharge.        Left eye: No discharge.     Conjunctiva/sclera: Conjunctivae normal.     Pupils: Pupils are equal, round, and reactive to light.  Neck:     Thyroid : No thyromegaly.     Vascular: No carotid bruit.  Cardiovascular:     Rate and Rhythm: Normal rate and regular rhythm.     Heart sounds: Normal heart sounds. No murmur heard.    No gallop.  Pulmonary:     Effort: Pulmonary effort is normal. No accessory muscle usage or respiratory distress.     Breath sounds: Normal breath sounds. No decreased breath sounds, wheezing or rales.  Abdominal:     General: Bowel sounds are normal. There is no distension.     Palpations: Abdomen is soft.     Tenderness: There is no abdominal tenderness. There is no right CVA tenderness or left CVA tenderness.  Musculoskeletal:     Right shoulder:  Tenderness (anterior shoulder) present. No swelling, bony tenderness or crepitus. Decreased range of motion (unable to raise arm up to full degree). Decreased strength (3/5). Normal pulse.     Left shoulder: Normal.     Cervical back: Normal range of motion and neck supple.     Right lower leg: 2+ Pitting Edema present.     Left lower leg: 2+ Pitting Edema present.     Comments: Multiple bruises in various stages of healing both arms.  Lymphadenopathy:     Cervical: No cervical adenopathy.  Skin:    General: Skin is warm and dry.  Neurological:     Mental Status: She is alert and oriented to person, place, and time.     Cranial Nerves: Cranial nerves 2-12 are intact.     Coordination: Coordination is intact.     Gait: Gait is intact.     Deep Tendon Reflexes: Reflexes are normal and symmetric.     Reflex Scores:      Brachioradialis reflexes are 2+ on the right side and 2+ on the left side.      Patellar reflexes are 2+ on the right side and 2+ on the left side.    Comments: Steady gait present with walker, improved from last assessment.  Psychiatric:        Attention and Perception: Attention normal.        Mood and Affect: Mood normal.  Speech: Speech normal.        Behavior: Behavior normal. Behavior is cooperative.        Thought Content: Thought content normal.     Results for orders placed or performed in visit on 11/15/23  CBC (STAT)   Collection Time: 11/15/23  3:02 PM  Result Value Ref Range   WBC 10.4 3.4 - 10.8 x10E3/uL   RBC 3.48 (L) 3.77 - 5.28 x10E6/uL   Hemoglobin 10.5 (L) 11.1 - 15.9 g/dL   Hematocrit 69.9 (L) 65.9 - 46.6 %   MCV 86 79 - 97 fL   MCH 30.2 26.6 - 33.0 pg   MCHC 35.0 31.5 - 35.7 g/dL   RDW 84.9 88.2 - 84.5 %   Platelets 218 150 - 450 x10E3/uL      Assessment & Plan:   Problem List Items Addressed This Visit       Cardiovascular and Mediastinum   DVT (deep venous thrombosis)_left leg extensive DVT - Primary   To LLE, continue  Eliquis BID for at least 3 months and then will need to reassess.  Is higher risk for clots. Referral to vascular for follow-up. Suspect provoked due to recent hospitalizations prior to this and pneumonia.      Relevant Medications   apixaban (ELIQUIS) 5 MG TABS tablet   Other Relevant Orders   Comprehensive metabolic panel with GFR   CBC (STAT) (Completed)   Ambulatory referral to Home Health   Ambulatory referral to Vascular Surgery   (HFpEF) heart failure with preserved ejection fraction [facility])   Ongoing, will get into cardiology at future visit.  At this time EF 55 to 60% on 09/05/23.  Overall stable.  Recommend: - Reminded to call for an overnight weight gain of >2 pounds or a weekly weight gain of >5 pounds - not adding salt to food and read food labels. Reviewed the importance of keeping daily sodium intake to 2000mg  daily.       Relevant Medications   apixaban (ELIQUIS) 5 MG TABS tablet   Other Relevant Orders   Ambulatory referral to Home Health     Endocrine   Type 2 diabetes mellitus in patient with obesity (HCC)   Chronic, ongoing with A1c 6.9% in hospital in August, urine ALB 80 September 2025  Taking Metformin  1000 MG BID and tolerating.  Recommend check BS fasting every morning and document for visits.  Recommend heavy focus on diabetic diet at home and regular exercise. - Eye exam up to date. Will get records. - Foot exam up to date - ARB and Statin on board - Pneumococcal and Flu vaccines up to date.      Relevant Orders   Comprehensive metabolic panel with GFR     Genitourinary   Acute kidney injury superimposed on chronic kidney disease   Acute and improving prior to discharge.  Recheck labs today and ensure good hydration at home.      Relevant Orders   Comprehensive metabolic panel with GFR   Ambulatory referral to Home Health     Other   Obesity   BMI 41.96. Recommended eating smaller high protein, low fat meals more frequently and exercising 30 mins a  day 5 times a week with a goal of 10-15lb weight loss in the next 3 months. Patient voiced their understanding and motivation to adhere to these recommendations.       Anemia of chronic disease   Ongoing on labs, on check today levels are trending up.  Check  iron and ferritin.  Recommend increasing iron rich foods in diet.  Hematology as needed in future.      Relevant Orders   Ferritin   Iron   Acute pain of right shoulder   Ongoing since her hospitalization with reduction in ROM present, dominant arm.  Will get her into ortho and continue OT at home. Referral to home health.       Relevant Orders   Ambulatory referral to Home Health   Ambulatory referral to Orthopedic Surgery     Follow up plan: Return in about 4 weeks (around 12/13/2023) for T2DM, HTN/HLD -- can cancel November 3rd visit.

## 2023-11-15 NOTE — Assessment & Plan Note (Signed)
 BMI 41.96. Recommended eating smaller high protein, low fat meals more frequently and exercising 30 mins a day 5 times a week with a goal of 10-15lb weight loss in the next 3 months. Patient voiced their understanding and motivation to adhere to these recommendations.

## 2023-11-15 NOTE — Assessment & Plan Note (Signed)
 Ongoing on labs, on check today levels are trending up.  Check iron and ferritin.  Recommend increasing iron rich foods in diet.  Hematology as needed in future.

## 2023-11-15 NOTE — Assessment & Plan Note (Signed)
 Chronic, ongoing with A1c 6.9% in hospital in August, urine ALB 80 September 2025  Taking Metformin  1000 MG BID and tolerating.  Recommend check BS fasting every morning and document for visits.  Recommend heavy focus on diabetic diet at home and regular exercise. - Eye exam up to date. Will get records. - Foot exam up to date - ARB and Statin on board - Pneumococcal and Flu vaccines up to date.

## 2023-11-15 NOTE — Assessment & Plan Note (Addendum)
 To LLE, continue Eliquis BID for at least 3 months and then will need to reassess.  Is higher risk for clots. Referral to vascular for follow-up. Suspect provoked due to recent hospitalizations prior to this and pneumonia.

## 2023-11-15 NOTE — Assessment & Plan Note (Signed)
 Ongoing since her hospitalization with reduction in ROM present, dominant arm.  Will get her into ortho and continue OT at home. Referral to home health.

## 2023-11-15 NOTE — Assessment & Plan Note (Signed)
 Ongoing, will get into cardiology at future visit.  At this time EF 55 to 60% on 09/05/23.  Overall stable.  Recommend: - Reminded to call for an overnight weight gain of >2 pounds or a weekly weight gain of >5 pounds - not adding salt to food and read food labels. Reviewed the importance of keeping daily sodium intake to 2000mg  daily.

## 2023-11-15 NOTE — Assessment & Plan Note (Signed)
 Acute and improving prior to discharge.  Recheck labs today and ensure good hydration at home.

## 2023-11-16 ENCOUNTER — Non-Acute Institutional Stay (SKILLED_NURSING_FACILITY): Payer: Self-pay | Admitting: Internal Medicine

## 2023-11-16 ENCOUNTER — Encounter: Payer: Self-pay | Admitting: Internal Medicine

## 2023-11-16 ENCOUNTER — Ambulatory Visit: Payer: Self-pay | Admitting: Nurse Practitioner

## 2023-11-16 DIAGNOSIS — E876 Hypokalemia: Secondary | ICD-10-CM

## 2023-11-16 DIAGNOSIS — M25511 Pain in right shoulder: Secondary | ICD-10-CM

## 2023-11-16 DIAGNOSIS — E669 Obesity, unspecified: Secondary | ICD-10-CM

## 2023-11-16 DIAGNOSIS — F99 Mental disorder, not otherwise specified: Secondary | ICD-10-CM

## 2023-11-16 DIAGNOSIS — Z6841 Body Mass Index (BMI) 40.0 and over, adult: Secondary | ICD-10-CM

## 2023-11-16 DIAGNOSIS — I5032 Chronic diastolic (congestive) heart failure: Secondary | ICD-10-CM

## 2023-11-16 DIAGNOSIS — F5105 Insomnia due to other mental disorder: Secondary | ICD-10-CM | POA: Diagnosis not present

## 2023-11-16 DIAGNOSIS — I82412 Acute embolism and thrombosis of left femoral vein: Secondary | ICD-10-CM

## 2023-11-16 DIAGNOSIS — E119 Type 2 diabetes mellitus without complications: Secondary | ICD-10-CM

## 2023-11-16 DIAGNOSIS — E271 Primary adrenocortical insufficiency: Secondary | ICD-10-CM

## 2023-11-16 LAB — COMPREHENSIVE METABOLIC PANEL WITH GFR
ALT: 16 IU/L (ref 0–32)
AST: 8 IU/L (ref 0–40)
Albumin: 3.7 g/dL — ABNORMAL LOW (ref 3.8–4.8)
Alkaline Phosphatase: 63 IU/L (ref 49–135)
BUN/Creatinine Ratio: 18 (ref 12–28)
BUN: 15 mg/dL (ref 8–27)
Bilirubin Total: 0.3 mg/dL (ref 0.0–1.2)
CO2: 27 mmol/L (ref 20–29)
Calcium: 8.8 mg/dL (ref 8.7–10.3)
Chloride: 98 mmol/L (ref 96–106)
Creatinine, Ser: 0.83 mg/dL (ref 0.57–1.00)
Globulin, Total: 1.9 g/dL (ref 1.5–4.5)
Glucose: 181 mg/dL — ABNORMAL HIGH (ref 70–99)
Potassium: 3.4 mmol/L — ABNORMAL LOW (ref 3.5–5.2)
Sodium: 140 mmol/L (ref 134–144)
Total Protein: 5.6 g/dL — ABNORMAL LOW (ref 6.0–8.5)
eGFR: 75 mL/min/1.73 (ref >59)

## 2023-11-16 LAB — FERRITIN: Ferritin: 63 ng/mL (ref 15–150)

## 2023-11-16 LAB — IRON: Iron: 51 ug/dL (ref 27–139)

## 2023-11-16 MED ORDER — BELSOMRA 15 MG PO TABS: 15.0000 mg | ORAL_TABLET | Freq: Every evening | ORAL | 0 refills | Status: DC | PRN

## 2023-11-16 MED ORDER — AMITRIPTYLINE HCL 50 MG PO TABS: 50.0000 mg | ORAL_TABLET | Freq: Every day | ORAL | 0 refills | Status: DC

## 2023-11-16 MED ORDER — LOSARTAN POTASSIUM 50 MG PO TABS: 50.0000 mg | ORAL_TABLET | Freq: Every day | ORAL | 0 refills | Status: DC

## 2023-11-16 MED ORDER — ROSUVASTATIN CALCIUM 10 MG PO TABS: 10.0000 mg | ORAL_TABLET | Freq: Every day | ORAL | 0 refills | Status: AC

## 2023-11-16 MED ORDER — METFORMIN HCL 1000 MG PO TABS: 1000.0000 mg | ORAL_TABLET | Freq: Two times a day (BID) | ORAL | 0 refills | Status: DC

## 2023-11-16 MED ORDER — MECLIZINE HCL 25 MG PO TABS: 25.0000 mg | ORAL_TABLET | Freq: Three times a day (TID) | ORAL | 0 refills | Status: AC | PRN

## 2023-11-16 MED ORDER — POTASSIUM CHLORIDE CRYS ER 10 MEQ PO TBCR: 10.0000 meq | EXTENDED_RELEASE_TABLET | Freq: Every day | ORAL | 0 refills | Status: DC

## 2023-11-16 MED ORDER — FUROSEMIDE 40 MG PO TABS: 40.0000 mg | ORAL_TABLET | Freq: Every day | ORAL | 0 refills | Status: DC

## 2023-11-16 MED ORDER — INSULIN GLARGINE 100 UNIT/ML SOLOSTAR PEN
8.0000 [IU] | PEN_INJECTOR | Freq: Every day | SUBCUTANEOUS | 0 refills | Status: AC
Start: 2023-11-16 — End: ?

## 2023-11-16 MED ORDER — APIXABAN 5 MG PO TABS: 5.0000 mg | ORAL_TABLET | Freq: Two times a day (BID) | ORAL | 0 refills | Status: DC

## 2023-11-16 MED ORDER — PREGABALIN 150 MG PO CAPS: ORAL_CAPSULE | ORAL | 0 refills | Status: DC

## 2023-11-16 MED ORDER — PANTOPRAZOLE SODIUM 40 MG PO TBEC: 40.0000 mg | DELAYED_RELEASE_TABLET | Freq: Every day | ORAL | 0 refills | Status: AC

## 2023-11-16 MED ORDER — ALBUTEROL SULFATE (2.5 MG/3ML) 0.083% IN NEBU: 2.5000 mg | INHALATION_SOLUTION | RESPIRATORY_TRACT | 0 refills | Status: AC | PRN

## 2023-11-16 MED ORDER — HYDROCORTISONE 20 MG PO TABS: 20.0000 mg | ORAL_TABLET | Freq: Two times a day (BID) | ORAL | 0 refills | Status: AC

## 2023-11-16 MED ORDER — AMLODIPINE BESYLATE 2.5 MG PO TABS: 2.5000 mg | ORAL_TABLET | Freq: Every day | ORAL | 0 refills | Status: AC

## 2023-11-16 NOTE — Assessment & Plan Note (Addendum)
 Stable.  Continue with amitriptyline  50 mg nightly, Belsomra  50 mg nightly as needed

## 2023-11-16 NOTE — Assessment & Plan Note (Addendum)
 Stable.  Euvolemic.  Last echocardiogram in August 2025 showed LVEF of 55 to 60% .  Continue losartan  50 mg daily, Lasix  40 mg daily

## 2023-11-16 NOTE — Assessment & Plan Note (Addendum)
 Patient on Eliquis 5 mg twice daily now.  She will need at least 6 to 12 months of therapy.  This is her first unprovoked VTE.  She has been cancer free now for several years.  If her PCP is uncomfortable with duration of therapy, she can be referred back to her oncologist for final determination of length of therapy.

## 2023-11-16 NOTE — Progress Notes (Signed)
 [facility] SNF Admission H&P  Provider: Camellia Door, DO Location:  Other [place].  Nursing Home Room Number: [facility] 106A Place of Service:  SNF (31)   PCP: Cannady, [name] T, NP Patient Care Team: Valerio Melanie DASEN, NP as PCP - General (Nurse Practitioner) [facility] (Optometry) Danis, Victory LITTIE MOULD, MD as Consulting Physician (Gastroenterology) Phebe, Janina Pates, MD as Referring Physician (Endocrinology) Simers, Saralyn Caldron, NP as Nurse Practitioner (Neurology) [name], NP as Nurse Practitioner (Behavioral Health) [name], MD (Dermatology)   Extended Emergency Contact Information Primary Emergency Contact: Ferdie Iha  [place] Home Phone: [phone] Relation: Daughter Secondary Emergency Contact: Willard,Jay Mobile Phone: [phone] Relation: Other   Goals of Care: Advanced Directive information    11/11/2023   11:26 AM  Advanced Directives  Does Patient Have a Medical Advance Directive? No  Would patient like information on creating a medical advance directive? No - Patient declined    CODE STATUS: Full Code    Chief Complaint  Patient presents with   New Admit To SNF    Admission.      HPI: Patient is a 73 y.o. female seen today for admission to [place] at [facility] in the [facility] community.  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  She has been disease-free for about 7 years.  She no longer follows with oncology on an annual basis now.  She presented to the hospital on 11/05/2023 after having right leg pain and swelling for about 4 days prior to admission.  She was noted to have a very large DVT noted on lower extremity ultrasound that encompassed the common femoral, saphenofemoral junction, profunda, popliteal and calf veins.  Vascular surgery was consulted.   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  She went underwent mechanical thrombectomy on 11/08/2018.  She had been on IV heparin  prior to surgery and then converted over to p.o. Eliquis after surgery.  She was discharged from the hospital and admitted to acute rehab at [facility].  She has been doing well since admission.  Pain is minimal.  She still having residual edema of her left leg.  She has had right lower extremity edema since her prior lymph node dissection on her right leg for her melanoma many years ago.  This is a comprehensive admission note to this SNFperformed on this date less than 30 days from date of admission. Included are preadmission medical/surgical history; reconciled medication list; family history; social history and comprehensive review of systems.   Corrections and additions to the records were documented. Comprehensive physical exam was also performed. Additionally a clinical summary was entered for each active diagnosis pertinent to this admission in the Problem List to enhance continuity of care.   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)    PMS (premenstrual syndrome)    Past Surgical History:  Procedure Laterality Date   BREAST BIOPSY Right    benign   CHOLECYSTECTOMY     COLONOSCOPY  11/08/2018   Danis-polyps   COLONOSCOPY  12/05/2019   Danis   DILATION AND CURETTAGE OF UTERUS     FOOT SURGERY Right 2018   removal of melanoma    LUMBAR FUSION     lymph node removal Right 2018   groin    PERIPHERAL VASCULAR THROMBECTOMY Left 11/08/2023   Procedure: PERIPHERAL VASCULAR THROMBECTOMY;  Surgeon: Marea Selinda RAMAN, MD;  Location: [facility] INVASIVE CV LAB;  Service: Cardiovascular;  Laterality: Left;   POLYPECTOMY     TOTAL ABDOMINAL HYSTERECTOMY W/ BILATERAL SALPINGOOPHORECTOMY     TUBAL LIGATION      reports that she has never smoked. She has never been exposed to tobacco smoke. She has never used smokeless tobacco. She reports that she does not currently use alcohol. She reports that she does not use drugs. Social History   Socioeconomic History   Marital status: Widowed    Spouse name: Not on file   Number of children: 1   Years of education: 15   Highest education level: High school graduate  Occupational History   Occupation: Retired  Tobacco Use   Smoking status: Never    Passive exposure: Never   Smokeless tobacco: Never  Vaping Use   Vaping status: Never Used  Substance and Sexual Activity   Alcohol use: Not Currently   Drug use: Never   Sexual activity: Not Currently  Other Topics Concern   Not on file  Social History Narrative   Daughter lives in [place].  Patient lives [address] in [place].    Social Drivers of Corporate Investment Banker Strain: Low Risk  (10/24/2023)   Received from [facility]   Overall Financial Resource Strain (CARDIA)    Difficulty of Paying Living Expenses: Not hard at all  Food Insecurity: No Food Insecurity (11/06/2023)   Hunger Vital Sign    Worried About Running Out of Food in the Last Year: Never true    Ran Out of Food in the Last Year: Never true  Transportation Needs: No Transportation Needs (11/07/2023)   PRAPARE - Administrator, Civil Service (Medical): No    Lack of Transportation (Non-Medical): No  Physical Activity: Insufficiently Active (09/29/2022)   Exercise Vital Sign    Days of Exercise per  Week: 3 days    Minutes of Exercise per Session: 40 min  Stress: No Stress Concern Present (09/29/2022)   [employer] of Occupational Health - Occupational Stress Questionnaire    Feeling of Stress : Not at all  Social Connections: Moderately Integrated (11/07/2023)   Social Connection and Isolation Panel    Frequency of Communication with Friends and Family: More than three times a week    Frequency of Social Gatherings with Friends and Family: More than three times a week    Attends Religious Services: More than 4 times per year    Active Member of [employer] or Organizations: Yes    Attends Banker Meetings: Never    Marital Status: Widowed  Recent Concern: Social Connections - Moderately Isolated (09/05/2023)   Social Connection and Isolation Panel    Frequency of Communication with  Friends and Family: More than three times a week    Frequency of Social Gatherings with Friends and Family: More than three times a week    Attends Religious Services: More than 4 times per year    Active Member of [employer] or Organizations: No    Attends Banker Meetings: Never    Marital Status: Widowed  Intimate Partner Violence: Not At Risk (11/09/2023)   Humiliation, Afraid, Rape, and Kick questionnaire    Fear of Current or Ex-Partner: No    Emotionally Abused: No    Physically Abused: No    Sexually Abused: No     Functional Status Survey:     Family History  Problem Relation Age of Onset   Depression Mother    Anxiety disorder Mother    Bipolar disorder Mother    Diabetes Mother    Mental illness Mother    Heart disease Mother    Stroke Mother    Lung cancer Father    Diabetes Maternal Grandmother    Heart attack Maternal Grandmother    CVA Paternal Grandmother    Colon cancer Neg Hx    Liver cancer Neg Hx    Pancreatic cancer Neg Hx    Esophageal cancer Neg Hx    Stomach cancer Neg Hx    Rectal cancer Neg Hx    Colon polyps Neg Hx    Breast cancer Neg  Hx      Health Maintenance  Topic Date Due   OPHTHALMOLOGY EXAM  04/19/2021   Medicare Annual Wellness (AWV)  09/29/2023   COVID-19 Vaccine (5 - 2025-26 season) 11/30/2023 (Originally 09/20/2023)   Zoster Vaccines- Shingrix  (1 of 2) 01/09/2024 (Originally 07/30/1969)   DTaP/Tdap/Td (3 - Td or Tdap) 04/14/2024 (Originally 02/17/2023)   HEMOGLOBIN A1C  03/02/2024   Colonoscopy  03/19/2024   FOOT EXAM  09/13/2024   Mammogram  10/11/2024   Diabetic kidney evaluation - Urine ACR  10/12/2024   Diabetic kidney evaluation - eGFR measurement  11/14/2024   DEXA SCAN  10/03/2026   Pneumococcal Vaccine: 50+ Years  Completed   Influenza Vaccine  Completed   Hepatitis C Screening  Completed   Meningococcal B Vaccine  Aged Out     Allergies  Allergen Reactions   Clindamycin/Lincomycin Anaphylaxis   Doxycycline  Rash   Aspirin  Other (See Comments)    Pt is unsure of reaction, but states that she is allergic to aspirin    Ciprofloxacin Rash    very bad rash   Penicillins Rash    Has patient had a PCN reaction causing immediate rash, facial/tongue/throat swelling, SOB or lightheadedness with hypotension: No Has patient had a PCN reaction causing severe rash involving mucus membranes or skin necrosis: No Has patient had a PCN reaction that required hospitalization: No Has patient had a PCN reaction occurring within the last 10 years: No If all of the above answers are NO, then may proceed with Cephalosporin use.      Outpatient Encounter Medications as of 11/16/2023  Medication Sig   acetaminophen  (TYLENOL ) 325 MG tablet Take 650 mg by mouth every 4 (four) hours as needed.   albuterol  (PROVENTIL ) (2.5 MG/3ML) 0.083% nebulizer solution Inhale 3 mLs (2.5 mg total) into the lungs every 4 (four) hours as needed for wheezing or shortness of breath.   amitriptyline  (ELAVIL ) 50 MG tablet Take 1 tablet (50 mg total) by mouth at bedtime.   amLODipine  (NORVASC ) 2.5 MG tablet Take 1 tablet (2.5 mg  total) by mouth  daily.   apixaban (ELIQUIS) 5 MG TABS tablet Take 1 tablet (5 mg total) by mouth 2 (two) times daily.   Blood Glucose Monitoring Suppl (ONETOUCH VERIO) w/Device KIT Use to check blood sugar 3 times a day and document results, bring to appointments.  Goal is <130 fasting blood sugar and <180 two hours after meals.   Cholecalciferol (VITAMIN D ) 50 MCG (2000 UT) CAPS Take 1 capsule by mouth at bedtime.   Cyanocobalamin (VITAMIN B-12 PO) Take 1 tablet by mouth at bedtime.   EPINEPHrine  (EPIPEN  2-PAK) 0.3 mg/0.3 mL IJ SOAJ injection Inject 0.3 mg into the muscle as needed for anaphylaxis.   FIBER ADULT GUMMIES PO Take 1 tablet by mouth daily as needed.   furosemide  (LASIX ) 40 MG tablet TAKE 1 TABLET BY MOUTH EVERY DAY   hydrocortisone  (CORTEF ) 20 MG tablet Take 1 tablet (20 mg total) by mouth 2 (two) times daily.   insulin  glargine (LANTUS) 100 UNIT/ML Solostar Pen Inject 8 Units into the skin daily.   losartan  (COZAAR ) 50 MG tablet Take 50 mg by mouth daily.   meclizine  (ANTIVERT ) 25 MG tablet Take 1 tablet (25 mg total) by mouth 3 (three) times daily as needed for dizziness.   metFORMIN  (GLUCOPHAGE ) 1000 MG tablet Take 1 tablet (1,000 mg total) by mouth 2 (two) times daily with a meal.   OneTouch UltraSoft 2 Lancets MISC USE TO CHECK BLOOD SUGAR 3 TIMES A DAY AND DOCUMENT RESULTS, BRING TO APPOINTMENTS. GOAL IS <130 FASTING BLOOD SUGAR AND <180 TWO HOURS AFTER MEALS.   pantoprazole  (PROTONIX ) 40 MG tablet Take 1 tablet (40 mg total) by mouth daily.   pregabalin  (LYRICA ) 150 MG capsule Take 1 capsule (150 mg total) by mouth 2 (two) times daily AND 2 capsules (300 mg total) at bedtime.   rosuvastatin  (CRESTOR ) 10 MG tablet Take 1 tablet (10 mg total) by mouth daily.   senna-docusate (SENOKOT-S) 8.6-50 MG tablet Take 2 tablets by mouth 2 (two) times daily.   Suvorexant  (BELSOMRA ) 15 MG TABS Take 1 tablet (15 mg total) by mouth at bedtime as needed.   No facility-administered encounter  medications on file as of 11/16/2023.     Review of Systems  Constitutional: Negative.   HENT: Negative.    Eyes: Negative.   Respiratory: Negative.    Cardiovascular: Negative.   Gastrointestinal: Negative.   Endocrine: Negative.   Genitourinary: Negative.   Skin:        Continued left lower extremity edema.  She is not wearing compression stockings on her right leg.  She is not wearing compression stockings on her left leg.  Allergic/Immunologic: Negative.   Neurological: Negative.   Hematological: Negative.   Psychiatric/Behavioral: Negative.    All other systems reviewed and are negative.    Vitals:   11/16/23 0923  BP: 139/85  Pulse: 78  Resp: 18  Temp: (!) 96.9 F (36.1 C)  SpO2: 95%  Weight: 246 lb 9.6 oz (111.9 kg)  Height: 5' 4 (1.626 m)   Body mass index is 42.33 kg/m. Physical Exam Vitals and nursing note reviewed.  Constitutional:      General: She is not in acute distress.    Appearance: She is not toxic-appearing or diaphoretic.  HENT:     Head: Normocephalic and atraumatic.  Cardiovascular:     Rate and Rhythm: Normal rate and regular rhythm.  Pulmonary:     Effort: Pulmonary effort is normal.     Breath sounds: Normal breath sounds.  Abdominal:  General: Bowel sounds are normal. There is no distension.     Palpations: Abdomen is soft.  Musculoskeletal:     Right lower leg: Edema present.     Left lower leg: Edema present.     Comments: 1-2+ pitting edema bilateral lower extremities, pretibial, ankle, pedal.  Skin:    General: Skin is warm and dry.     Capillary Refill: Capillary refill takes less than 2 seconds.  Neurological:     Mental Status: She is alert and oriented to person, place, and time.      Labs reviewed: Basic Metabolic Panel: Recent Labs    08/31/23 0514 09/01/23 0350 09/02/23 0316 09/04/23 1755 09/14/23 1209 09/15/23 0600 11/06/23 0427 11/08/23 0527 11/15/23 1450  NA 135 137 141   < > 143   < > 136 137 140   K 4.5 4.3 4.2   < > 3.7   < > 3.8 3.8 3.4*  CL 105 104 105   < > 98   < > 100 100 98  CO2 20* 24 26   < > 30   < > 24 26 27   GLUCOSE 244* 161* 143*   < > 155*   < > 274* 300* 181*  BUN 30* 27* 28*   < > 20   < > 17 24* 15  CREATININE 1.65* 1.18* 1.00   < > 1.77*   < > 0.82 0.79 0.83  CALCIUM  8.0* 8.2* 8.3*   < > 9.1   < > 8.5* 8.5* 8.8  MG 1.7 2.1 2.2  --  1.6*  --   --  1.9  --   PHOS 3.1 2.5 2.3*  --   --   --   --   --   --    < > = values in this interval not displayed.   Liver Function Tests: Recent Labs    10/13/23 0905 11/08/23 0527 11/15/23 1450  AST 5 13* 8  ALT 9 12 16   ALKPHOS 76 49 63  BILITOT 0.3 0.3 0.3  PROT 5.5* 5.9* 5.6*  ALBUMIN 3.8 2.6* 3.7*   Recent Labs    08/30/23 1506 09/04/23 1755  LIPASE 22 35    CBC: Recent Labs    09/06/23 0534 09/07/23 0530 09/14/23 1045 09/23/23 0000 11/05/23 1514 11/09/23 0417 11/10/23 0519 11/15/23 1502  WBC 13.7* 11.4*   < > 8.1   < > 8.9 10.2 10.4  NEUTROABS 10.3* 8.5*  --  3,451.00  --   --   --   --   HGB 9.8* 9.6*   < > 12.2   < > 8.8* 9.0* 10.5*  HCT 29.8* 29.7*   < > 38   < > 26.9* 28.1* 30.0*  MCV 88.4 88.1   < >  --    < > 87.6 88.4 86  PLT 183 184   < > 219   < > 232 240 218   < > = values in this interval not displayed.    BNP: Invalid input(s): POCBNP Lab Results  Component Value Date   HGBA1C 6.9 (H) 08/31/2023   Lab Results  Component Value Date   TSH 0.736 09/04/2023   Lab Results  Component Value Date   VITAMINB12 1,867 (H) 04/15/2023   Lab Results  Component Value Date   FOLATE 12.1 07/19/2012   Lab Results  Component Value Date   IRON 51 11/15/2023   FERRITIN 63 11/15/2023     Imaging and Procedures obtained  prior to SNF admission: CT Angio Chest PE W/Cm &/Or Wo Cm Result Date: 11/05/2023 EXAM: CTA CHEST 11/05/2023 06:04:06 PM TECHNIQUE: CTA of the chest was performed with and without the administration of 50 mL of intravenous contrast. Multiplanar reformatted images are  provided for review. MIP images are provided for review. Automated exposure control, iterative reconstruction, and/or weight based adjustment of the mA/kV was utilized to reduce the radiation dose to as low as reasonably achievable. COMPARISON: Chest x-ray and CT 09/04/2023. CLINICAL HISTORY: Pulmonary embolism (PE) suspected, low to intermediate prob, positive D-dimer. Pt comes with sob since Tuesday and sever swelling in left leg. Pt states wheeping from leg too. Pt states more exertion when walking. FINDINGS: PULMONARY ARTERIES: Pulmonary arteries are adequately opacified for evaluation. No acute pulmonary embolus. Main pulmonary artery is normal in caliber. MEDIASTINUM: Coronary artery and aortic atherosclerotic calcification. The heart and pericardium demonstrate no acute abnormality. There is no acute abnormality of the thoracic aorta. LYMPH NODES: No mediastinal, hilar or axillary lymphadenopathy. LUNGS AND PLEURA: The lungs are without acute process. No focal consolidation or pulmonary edema. No evidence of pleural effusion or pneumothorax. UPPER ABDOMEN: Limited images of the upper abdomen demonstrate hepatic steatosis. Otherwise, the visualized portions are unremarkable. SOFT TISSUES AND BONES: No acute bone or soft tissue abnormality. IMPRESSION: 1. No pulmonary embolism. Electronically signed by: Norman Gatlin MD 11/05/2023 06:39 PM EDT RP Workstation: [id number]   US  Venous Img Lower Unilateral Left Result Date: 11/05/2023 CLINICAL DATA:  Left lower extremity edema for 4 days EXAM: LEFT LOWER EXTREMITY VENOUS DOPPLER ULTRASOUND TECHNIQUE: Gray-scale sonography with graded compression, as well as color Doppler and duplex ultrasound were performed to evaluate the lower extremity deep venous systems from the level of the common femoral vein and including the common femoral, femoral, profunda femoral, popliteal and calf veins including the posterior tibial, peroneal and gastrocnemius veins when  visible. The superficial great saphenous vein was also interrogated. Spectral Doppler was utilized to evaluate flow at rest and with distal augmentation maneuvers in the common femoral, femoral and popliteal veins. COMPARISON:  None Available. FINDINGS: Contralateral Common Femoral Vein: Respiratory phasicity is normal and symmetric with the symptomatic side. No evidence of thrombus. Normal compressibility. Common Femoral Vein: Occlusive thrombus. The vessel is noncompressible with no Doppler flow detected. Saphenofemoral Junction: Occlusive thrombus with no Doppler flow detected. Profunda Femoral Vein: Occlusive thrombus. The vessel is noncompressible with no Doppler flow detected. Femoral Vein: Occlusive thrombus. The vessel is noncompressible with no Doppler flow detected. Popliteal Vein: Occlusive thrombus, the vessel is noncompressible with no Doppler flow detected. Calf Veins: No evidence of thrombus, though evaluation is limited due to subcutaneous edema. Superficial Great Saphenous Vein: Near occlusive thrombus is identified, with only a small segment of the saphenous vein demonstrating Doppler flow. Other Findings:  None. IMPRESSION: 1. Extensive acute appearing deep venous thrombosis throughout the entirety of the left lower extremity as above. Critical Value/emergent results were called by telephone at the time of interpretation on 11/05/2023 at 5:55 pm to provider LORELLE CHEADLE, who verbally acknowledged these results. Electronically Signed   By: Ozell Daring M.D.   On: 11/05/2023 17:59   DG Chest 2 View Result Date: 11/05/2023 CLINICAL DATA:  Shortness of breath. EXAM: CHEST - 2 VIEW COMPARISON:  09/14/2023. FINDINGS: Low lung volume. Bilateral lung fields are clear. Bilateral costophrenic angles are clear. Stable cardio-mediastinal silhouette. No acute osseous abnormalities. The soft tissues are within normal limits. IMPRESSION: No active cardiopulmonary disease. Electronically Signed   By:  Ree Molt M.D.   On: 11/05/2023 15:04     Assessment/Plan Assessment & Plan Insomnia due to other mental disorder Stable.  Continue with amitriptyline  50 mg nightly, Belsomra  50 mg nightly as needed     Acute deep vein thrombosis (DVT) of femoral vein of left lower extremity (HCC) Patient on Eliquis 5 mg twice daily now.  She will need at least 6 to 12 months of therapy.  This is her first unprovoked VTE.  She has been cancer free now for several years.  If her PCP is uncomfortable with duration of therapy, she can be referred back to her oncologist for final determination of length of therapy.     Acute pain of right shoulder Patient is able to laterally raise her right arm and raise her right arm posteriorly.  It appears that either she has a weak anterior deltoid or she may have some arthritis or acromioclavicular impingement causing her inability to forward flex her shoulder.  She states that she is being seen by orthopedics in a few weeks.     Adrenal insufficiency (Addison's disease) (HCC) Continue on her current dose of hydrocortisone  20 mg twice daily     Chronic heart failure with preserved ejection fraction (HFpEF) (HCC) Stable.  Euvolemic.  Last echocardiogram in August 2025 showed LVEF of 55 to 60% .  Continue losartan  50 mg daily, Lasix  40 mg daily     Morbid obesity with BMI of 40.0-44.9, adult (HCC) Body mass index is 42.33 kg/m.      Type 2 diabetes mellitus in patient with obesity (HCC) Continue with lantus, metformin  and       Camellia Door, DO  [facility] [phone]

## 2023-11-16 NOTE — Assessment & Plan Note (Addendum)
 Continue with lantus, metformin  and

## 2023-11-16 NOTE — Assessment & Plan Note (Addendum)
 Patient is able to laterally raise her right arm and raise her right arm posteriorly.  It appears that either she has a weak anterior deltoid or she may have some arthritis or acromioclavicular impingement causing her inability to forward flex her shoulder.  She states that she is being seen by orthopedics in a few weeks.

## 2023-11-16 NOTE — Assessment & Plan Note (Addendum)
 Continue on her current dose of hydrocortisone  20 mg twice daily

## 2023-11-16 NOTE — Assessment & Plan Note (Addendum)
Body mass index is 42.33 kg/m.

## 2023-11-17 NOTE — Progress Notes (Signed)
 Called patient and left a message for her to call back to get scheduled for labs .

## 2023-11-18 ENCOUNTER — Ambulatory Visit

## 2023-11-18 DIAGNOSIS — M75101 Unspecified rotator cuff tear or rupture of right shoulder, not specified as traumatic: Secondary | ICD-10-CM | POA: Diagnosis not present

## 2023-11-18 DIAGNOSIS — M25521 Pain in right elbow: Secondary | ICD-10-CM | POA: Diagnosis not present

## 2023-11-18 DIAGNOSIS — M25511 Pain in right shoulder: Secondary | ICD-10-CM

## 2023-11-18 MED ORDER — TRIAMCINOLONE ACETONIDE 40 MG/ML IJ SUSP
40.0000 mg | INTRAMUSCULAR | Status: AC | PRN
  Administered 2023-11-18: 40 mg via INTRA_ARTICULAR

## 2023-11-18 NOTE — Progress Notes (Signed)
 Chief Complaint: Right arm pain and weakness    History of Present Illness:    Sarah [name] is a 73 y.o. female presents with 2 to 3-week history of sudden right shoulder pain and weakness.  Patient was recently hospitalized for DVT (at [facility] on 11/05/2023-11/10/2023).  Patient states while in hospital bed she often used her right arm to help lift herself further up in the hospital bed.  Unsure of specific moment of injury.  Reports pain over anterior and lateral aspect of shoulder.  Will occasionally have radiating pain to elbow, along lateral aspect of upper arm.  Patient has been using over-the-counter Tylenol  and previously prescribed baclofen  and Lyrica  with no symptom improvement.  Denies forearm/hand weakness or paresthesias. Patient states has affected ADLs including doing her hair, dressing, eating.  Patient has not driven since hospitalization. Patient is right hand dominant.  Patient denies any previous history of RUE pain/issues. Patient with previous history of left sided cervical radiculopathy. Treated by Dr. Avanell with [facility] in 2023. Patient also with previous history of left elbow fracture s/p fall; healed well, no surgical treatment.  Status post discharge from the hospital for DVT, patient was at [facility] skilled nursing facility x 1 week.  Patient was just recently discharged home.  Patient lives at [facility].  Receiving twice weekly PT for balance/lower extremity strengthening. Patient currently using wheeled walker for ambulation assistance.  Patient is currently on Eliquis for DVT. Status post thrombectomy performed on 11/08/23.  Last A1c on 08/31/2023 was 6.9%. Patient on long-acting insulin  and metformin .   Patient seen by PCP, Melanie Polio NP with [facility], on 11/15/2023. Referred to orthopedics.  Patient accompanied to today's appointment by daughter, [name].   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)    PMS (premenstrual syndrome)    Past Surgical History:  Procedure Laterality Date   BREAST BIOPSY Right    benign   CHOLECYSTECTOMY     COLONOSCOPY  11/08/2018   Danis-polyps   COLONOSCOPY  12/05/2019   Danis   DILATION AND CURETTAGE OF UTERUS     FOOT SURGERY Right 2018   removal of melanoma   LUMBAR FUSION     lymph node removal Right 2018   groin    PERIPHERAL VASCULAR THROMBECTOMY Left 11/08/2023   Procedure: PERIPHERAL VASCULAR THROMBECTOMY;  Surgeon: Marea Selinda RAMAN, MD;  Location: [facility] INVASIVE CV LAB;  Service: Cardiovascular;  Laterality: Left;   POLYPECTOMY     TOTAL ABDOMINAL HYSTERECTOMY W/ BILATERAL SALPINGOOPHORECTOMY     TUBAL LIGATION     Social History   Socioeconomic History   Marital status: Widowed    Spouse name: Not on file   Number of children: 1   Years of education: 15   Highest education level: High school graduate  Occupational History   Occupation: Retired  Tobacco Use   Smoking status: Never    Passive exposure: Never   Smokeless tobacco: Never  Vaping Use   Vaping status: Never Used  Substance and Sexual Activity   Alcohol use: Not Currently   Drug use: Never   Sexual activity: Not Currently  Other Topics Concern   Not on file  Social History Narrative   Daughter lives in [place].  Patient lives [address] in [place].    Social Drivers of Corporate Investment Banker Strain: Low Risk   (10/24/2023)   Received from [facility]   Overall Financial Resource Strain (CARDIA)    Difficulty of Paying Living Expenses: Not hard at all  Food Insecurity: No Food Insecurity (11/06/2023)   Hunger Vital Sign    Worried About Running Out of Food in the Last Year: Never true    Ran Out of Food in the Last Year: Never true  Transportation Needs: No Transportation Needs (11/07/2023)   PRAPARE - Administrator, Civil Service (Medical): No    Lack of Transportation (Non-Medical): No  Physical Activity: Insufficiently Active (09/29/2022)   Exercise Vital Sign    Days of Exercise per Week: 3 days    Minutes of Exercise per Session: 40 min  Stress: No Stress Concern Present (09/29/2022)   [employer] of Occupational Health - Occupational Stress Questionnaire    Feeling of Stress : Not at all  Social Connections: Moderately Integrated (11/07/2023)   Social Connection and Isolation Panel    Frequency of Communication with Friends and Family: More than three times a week    Frequency of Social Gatherings with Friends and Family: More than three times a week    Attends Religious Services: More than 4 times per year    Active Member of [employer] or Organizations: Yes    Attends Banker Meetings: Never    Marital Status: Widowed  Recent Concern: Social Connections - Moderately Isolated (09/05/2023)   Social Connection and Isolation Panel    Frequency of Communication with Friends and Family: More than three times a week    Frequency of Social Gatherings with Friends and Family: More than three times a week    Attends Religious Services: More than 4 times per year    Active Member of [employer] or Organizations: No    Attends Banker Meetings: Never    Marital Status: Widowed   Family History  Problem Relation Age of Onset   Depression Mother    Anxiety disorder Mother    Bipolar disorder Mother    Diabetes Mother    Mental illness Mother     Heart disease Mother    Stroke Mother    Lung cancer Father    Diabetes Maternal Grandmother    Heart attack Maternal Grandmother    CVA Paternal Grandmother    Colon cancer Neg Hx    Liver cancer Neg Hx    Pancreatic cancer Neg Hx    Esophageal cancer Neg Hx    Stomach cancer Neg Hx    Rectal cancer Neg Hx    Colon polyps Neg Hx    Breast cancer Neg Hx    Allergies  Allergen Reactions   Clindamycin/Lincomycin Anaphylaxis   Doxycycline  Rash   Aspirin  Other (See Comments)    Pt is unsure of reaction, but states that she is allergic to aspirin    Ciprofloxacin Rash    very bad  rash   Penicillins Rash    Has patient had a PCN reaction causing immediate rash, facial/tongue/throat swelling, SOB or lightheadedness with hypotension: No Has patient had a PCN reaction causing severe rash involving mucus membranes or skin necrosis: No Has patient had a PCN reaction that required hospitalization: No Has patient had a PCN reaction occurring within the last 10 years: No If all of the above answers are NO, then may proceed with Cephalosporin use.    Current Outpatient Medications  Medication Sig Dispense Refill   acetaminophen  (TYLENOL ) 325 MG tablet Take 650 mg by mouth every 4 (four) hours as needed.     albuterol  (PROVENTIL ) (2.5 MG/3ML) 0.083% nebulizer solution Inhale 3 mLs (2.5 mg total) into the lungs every 4 (four) hours as needed for wheezing or shortness of breath. 75 mL 0   amitriptyline  (ELAVIL ) 50 MG tablet Take 1 tablet (50 mg total) by mouth at bedtime. 30 tablet 0   amLODipine  (NORVASC ) 2.5 MG tablet Take 1 tablet (2.5 mg total) by mouth daily. 30 tablet 0   apixaban (ELIQUIS) 5 MG TABS tablet Take 1 tablet (5 mg total) by mouth 2 (two) times daily. 60 tablet 0   Blood Glucose Monitoring Suppl (ONETOUCH VERIO) w/Device KIT Use to check blood sugar 3 times a day and document results, bring to appointments.  Goal is <130 fasting blood sugar and <180 two hours after meals.  1 kit 0   Cholecalciferol (VITAMIN D ) 50 MCG (2000 UT) CAPS Take 1 capsule by mouth at bedtime.     Cyanocobalamin (VITAMIN B-12 PO) Take 1 tablet by mouth at bedtime.     EPINEPHrine  (EPIPEN  2-PAK) 0.3 mg/0.3 mL IJ SOAJ injection Inject 0.3 mg into the muscle as needed for anaphylaxis. 2 each 0   FIBER ADULT GUMMIES PO Take 1 tablet by mouth daily as needed.     furosemide  (LASIX ) 40 MG tablet Take 1 tablet (40 mg total) by mouth daily. 30 tablet 0   hydrocortisone  (CORTEF ) 20 MG tablet Take 1 tablet (20 mg total) by mouth 2 (two) times daily. 60 tablet 0   insulin  glargine (LANTUS) 100 UNIT/ML Solostar Pen Inject 8 Units into the skin daily. 10 mL 0   losartan  (COZAAR ) 50 MG tablet Take 1 tablet (50 mg total) by mouth daily. 30 tablet 0   meclizine  (ANTIVERT ) 25 MG tablet Take 1 tablet (25 mg total) by mouth 3 (three) times daily as needed for dizziness. 60 tablet 0   metFORMIN  (GLUCOPHAGE ) 1000 MG tablet Take 1 tablet (1,000 mg total) by mouth 2 (two) times daily with a meal. 60 tablet 0   OneTouch UltraSoft 2 Lancets MISC USE TO CHECK BLOOD SUGAR 3 TIMES A DAY AND DOCUMENT RESULTS, BRING TO APPOINTMENTS. GOAL IS <130 FASTING BLOOD SUGAR AND <180 TWO HOURS AFTER MEALS. 100 each 2   pantoprazole  (PROTONIX ) 40 MG tablet Take 1 tablet (40 mg total) by mouth daily. 30 tablet 0   potassium chloride  (KLOR-CON  M) 10 MEQ tablet Take 1 tablet (10 mEq total) by mouth daily for 5 days. 5 tablet 0   pregabalin  (LYRICA ) 150 MG capsule Take 1 capsule (150 mg total) by mouth 2 (two) times daily AND 2 capsules (300 mg total) at bedtime. 120 capsule 0   rosuvastatin  (CRESTOR ) 10 MG tablet Take 1 tablet (10 mg total) by mouth daily. 30 tablet 0   senna-docusate (SENOKOT-S) 8.6-50 MG tablet Take 2 tablets by mouth 2 (two) times daily. 120 tablet 0  Suvorexant  (BELSOMRA ) 15 MG TABS Take 1 tablet (15 mg total) by mouth at bedtime as needed. 30 tablet 0   No current facility-administered medications for this visit.    No results found.  I have reviewed past medical, surgical, social and family history, medications, and allergies as documented in the EMR.   Review of Systems:    A ROS was performed including pertinent positives and negatives as documented in the HPI.   Physical Exam :    General/Constitutional: NAD and appears stated age Vascular: No edema, swelling or tenderness, except as noted in detailed exam Integumentary: No impressive skin lesions present, except as noted in detailed exam Neurological: Alert and oriented Psych: Appropriate affect and cooperative Musculoskeletal: Normal, except as noted in detailed exam and HPI There were no vitals taken for this visit.    Focused Orthopedic Exam:    Neck focused exam: Palpation: non-tender to palpation about the mid-line spine and paraspinal musculature ROM: within normal limits in flexion, extension, rotation and side-bending Spurling's negative   Shoulder focused exam:  Skin is intact about the right shoulder.  No obvious signs of trauma or deformity.    RIGHT LEFT  Scapula Atrophy Negative Negative   Winging Negative Negative  Rotator cuff Supraspinatus 4-/5 5/5   Infraspinatus 4-/5 5/5   Subscapularis 5/5 5/5  AROM/PROM (degrees) FF 0-10 / 0-170 0-170 / 0-170   ER0 0-10 / 0-45 0-60 / 0-60   IR(back) T6 / T6 T6 / T6  Palpation (pain): AC negative negative   Biceps positive negative   Coracoid negative negative  Special Tests: O'Briens positive negative   Mayo-shear Not performed Not performed   Cross body Adduction positive negative   Speeds  positive negative   Jobe's positive negative   Neer negative negative   Hawkins positive negative   Belly Press negative negative   Hornblower's positive negative  Other: Sulcus sign negative negative   Lateral deltoid 5/5 5/5    Vascular/Lymphatic: Fingers warm and well perfused with 2+ radial pulse.    Neurologic: Sensation intact to the Median, Ulnar and Radial nerve  distribution of the hand. Sensation intact to lateral deltoid (axillary nerve).      Imaging:    Xray (Right Elbow): Xray including 4-views (lateral, oblique x 2, AP) obtained today 11/18/2023 at [facility] were reviewed personally by me. Per my independent interpretation these images show no acute fracture or dislocation. Joint spaces well preserved. No anterior or posterior fat pad visualized.    Xray (Right Shoulder): Xray including 4-views (AP, grashey, y-view, axillary) obtained today 11/18/2023 at [facility] at [facility] were reviewed personally by me. Per my independent interpretation these images show no acute fracture or dislocation. Mild AC joint degenerative changes. Small humeral head cyst. No soft tissue abnormality.      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 female with 2 to 3-week history of right shoulder pain and weakness.  Patient's physical exam reveals pseudoparalysis, with significant lack of active flexion and external rotation and associated weakness.  Patient's imaging (right shoulder and right elbow x-ray) showed no acute abnormality.  Suspect patient has partial or complete tear of rotator cuff, specifically supraspinatus and infraspinatus.  Discussed situation with patient and patient's daughter.  Patient very hesitant to consider surgical options at this time due to co-morbidities and recent hospitalizations, experienced a DVT with subsequent thrombectomy within the  past few weeks and is currently on Eliquis.  Patient would prefer conservative modalities for now.  Discussed treatment measures including subacromial steroid injection as well as formal physical therapy. Provided corticosteroid injection into subacromial space (patient with insulin -dependent DM2, well-controlled, last A1c 6.9% two months ago and has tolerated  corticosteroids previously) and referral to formal physical therapy (offered at [facility]).  Advised patient to monitor glucose values as could see transient elevation from corticosteroid injection.   Informed patient and her daughter that if she does have a full-thickness rotator cuff tear we would need to assess with further imaging.  It is uncertain that conservative treatment measures will allow for reestablishment of normal function in the right arm.  Patient acknowledges and would like to proceed with conservative measures for now given her circumstances.  We will plan on seeing her back in 6 to 8 weeks for repeat evaluation.  She may contact the office sooner if needed.  All questions and concerns were addressed to the best of my ability.  Lucie Stabs PA-C Orthopedic Surgery & Sports Medicine [facility]   I personally saw and evaluated the patient, and participated in the management and treatment plan.  Arlyss GEANNIE Schneider DO Orthopedic Surgery & Sports Medicine [facility] with [facility]   Large Joint Inj: R subacromial bursa on 11/18/2023 9:46 AM Indications: pain Details: (21 G) 1.5 in needle, posterior approach Medications: 40 mg triamcinolone  acetonide 40 MG/ML (4 cc Ropivicaine 0.5%) Outcome: tolerated well, no immediate complications Procedure, treatment alternatives, risks and benefits explained, specific risks discussed. Consent was given by the patient. Immediately prior to procedure a time out was called to verify the correct patient, procedure, equipment, support staff and site/side marked as required. Patient was prepped and draped in the usual sterile fashion.    Procedure: The risks and benefits of a cortisone injection were discussed and the patient wishes to proceed.  The posterior aspect of the right shoulder was cleansed with alcohol swabs and ChloraPrep.  The skin was flash cooled with Ethyl Chloride, and using sterile technique,  a 21 gauge needle was immediately introduced into the subacromial space.  After aspiration revealed no backfill of blood, the injectate which consisted of 1cc of 40 mg/mL of Kenalog  and 4cc of 0.5% ropivacaine easily flowed into the subacromial space.  The needle was withdrawn and pressure was applied for hemostasis.  A bandage was applied.  The patient tolerated the procedure well. Patient instructed to ice the area tonight if sore.  Cortisone Injections You have received a cortisone injection today. This injection consists of a numbing medication and cortisone.  There may be side effects after receiving the injection.   If you are diabetic: Your blood sugar may increase for up to 48 hours.  Check it more often than normal.   General reactions: A cortisone flare reaction. This generally occurs 6-8 hours after receiving the injection.  Ice the area and take Tylenol  for pain.  If the pain lasts longer than 48 hours call the office.   Some patients may experience flushing, increased heart rate, red face or increase in body temperature.  This is rare but can happen.   Over the counter Benadryl , if appropriate, often reduces these symptoms.  For a severe reaction contact your family doctor or go to the emergency room.  If you have any other questions, feel free to ask.    This document was dictated using Conservation officer, historic buildings. A reasonable attempt at  proof reading has been made to minimize errors.

## 2023-11-18 NOTE — Progress Notes (Unsigned)
   11/18/2023  Patient ID: Sarah [name], female   DOB: 05/01/1950, 73 y.o.   MRN: [id number]  This patient is appearing on a report for being at risk of failing the adherence measure for diabetes medications this calendar year.   Medication: metformin  1000 mg tablets Last fill date: 11/16/23 for 90 day supply  Insurance report was not up to date. No action needed at this time.   Nayra Coury C. Dannel Rafter [facility] PharmD Candidate Class of [phone]

## 2023-11-18 NOTE — Patient Instructions (Signed)
 [facility] [address], [place] [address] [phone]   Thank you for visiting the office today. We appreciate your trust and allowing us  to help you with your orthopedic needs.  Please do not hesitate to call if you have further questions or concerns following your visit with us . If you experience life-threatening symptoms or it cannot wait until normal office hours, please go to the nearest Emergency Department for immediate evaluation.      Cortisone Injection Patient Information  -A cortisone injection has been recommended for you today during your office visit. The injection consists of two medications administered at the same time. The first medication is a numbing medication. This medication will help you to feel better today for a couple hours.  However, this medication will wear off today and your discomfort may return. The second medication is the steroid medication. This medication will usually take a few days for full effect.   -Some individuals, about 1 in 20, can have an increase in their pain after the injection for approximately 24-36 hours. This is called a steroid flare and may be associated with some slight flushing of the face. The best thing to do if this occurs is to ice the area as much as possible and take ibuprofen if you are able.   -Any patients who are currently receiving treatment for problems with their blood sugar should be aware that this medication may cause you to have elevated blood sugar. This will typically occur for 24-36 hours after the injection. We recommend that you check your blood sugar regularly during this time and contact your primary care provider immediately or go to the nearest emergency room if the levels get to high.

## 2023-11-22 ENCOUNTER — Inpatient Hospital Stay: Admitting: Nurse Practitioner

## 2023-11-22 ENCOUNTER — Encounter: Payer: Self-pay | Admitting: Radiology

## 2023-11-25 ENCOUNTER — Non-Acute Institutional Stay: Admitting: Nurse Practitioner

## 2023-11-25 ENCOUNTER — Encounter: Payer: Self-pay | Admitting: Nurse Practitioner

## 2023-11-25 VITALS — BP 154/74 | HR 87 | Temp 97.5°F | Ht 64.0 in | Wt 240.7 lb

## 2023-11-25 DIAGNOSIS — R0602 Shortness of breath: Secondary | ICD-10-CM | POA: Diagnosis not present

## 2023-11-25 DIAGNOSIS — I82412 Acute embolism and thrombosis of left femoral vein: Secondary | ICD-10-CM

## 2023-11-25 DIAGNOSIS — R531 Weakness: Secondary | ICD-10-CM

## 2023-11-25 DIAGNOSIS — I5032 Chronic diastolic (congestive) heart failure: Secondary | ICD-10-CM

## 2023-11-25 DIAGNOSIS — E271 Primary adrenocortical insufficiency: Secondary | ICD-10-CM

## 2023-11-25 DIAGNOSIS — R197 Diarrhea, unspecified: Secondary | ICD-10-CM

## 2023-11-25 DIAGNOSIS — R21 Rash and other nonspecific skin eruption: Secondary | ICD-10-CM

## 2023-11-25 LAB — BRAIN NATRIURETIC PEPTIDE: Brain Natriuretic Peptide: 155 pg/mL — ABNORMAL HIGH (ref <100)

## 2023-11-25 NOTE — Progress Notes (Signed)
 Careteam: Patient Care Team: Valerio Melanie DASEN, NP as PCP - General (Nurse Practitioner) [facility] (Optometry) Danis, Victory LITTIE MOULD, MD as Consulting Physician (Gastroenterology) Phebe, Janina Pates, MD as Referring Physician (Endocrinology) Simers, Saralyn Caldron, NP as Nurse Practitioner (Neurology) [name], NP as Nurse Practitioner (Behavioral Health) [name], MD (Dermatology) PLACE OF SERVICE:  [facility]   Advanced Directive information    Allergies  Allergen Reactions   Clindamycin/Lincomycin Anaphylaxis   Doxycycline  Rash   Aspirin  Other (See Comments)    Pt is unsure of reaction, but states that she is allergic to aspirin    Ciprofloxacin Rash    very bad rash   Penicillins Rash    Has patient had a PCN reaction causing immediate rash, facial/tongue/throat swelling, SOB or lightheadedness with hypotension: No Has patient had a PCN reaction causing severe rash involving mucus membranes or skin necrosis: No Has patient had a PCN reaction that required hospitalization: No Has patient had a PCN reaction occurring within the last 10 years: No If all of the above answers are NO, then may proceed with Cephalosporin use.     Chief Complaint  Patient presents with   Allergic Reaction    Allergic Reaction. Rash on Extremities. Started on Sunday. New Medications started are Eliquis and Potassium.      HPI: Patient is a 73 y.o. female seen in today  Discussed the use of AI scribe software for clinical note transcription with the patient, who gave verbal consent to proceed.  History of Present Illness Sarah [name] is a 73 year old female with heart failure and adrenal insufficiency who presents with a redness to her skin and diarrhea.  She has developed a rash resembling a severe sunburn, primarily on her legs and chest, with associated pruritus, especially on her legs and face. The facial itching began yesterday but  no redness. The rash is improving compared to previous days and is peeling, similar to a past allergic reaction to an antibiotic in August that required intensive care admission. She is not currently on antibiotics at this time.   She has experienced diarrhea for 4 days, with frequent bowel movements and decreased appetite. Her oral intake has been minimal, with only half a grilled cheese sandwich consumed yesterday. She feels weak and finds daily activities challenging, though she denies depression.   She is on metformin  but has discontinued insulin  after returning home. Denies hypoglycemia   She reports a cold, and a sore throat since Sunday, but denies fever. She experiences dyspnea on exertion, particularly when walking from her bedroom to the sunroom, which is unusual for her. She has a persistent cough and nasal congestion that began yesterday, with the cough disrupting her sleep. No chest pain, palpitations, or lightheadedness.  She has a history of heart failure and notes leg swelling. She has been unable to wear compression hose for a week due to difficulty donning them. She is currently taking Lasix .   She is on Eliquis since her hospitalization on October 17th due to DVT and recently completed a course of potassium.   She continues to take Cortef  twice daily for adrenal insufficiency. She regularly monitors her blood sugar and blood pressure, though she did not check her blood sugar this morning.     Review of Systems:  Review of Systems  Constitutional:  Negative for chills, fever and weight loss.  HENT:  Negative for tinnitus.   Respiratory:  Negative for cough,  sputum production and shortness of breath.   Cardiovascular:  Negative for chest pain, palpitations and leg swelling.  Gastrointestinal:  Negative for abdominal pain, constipation, diarrhea and heartburn.  Genitourinary:  Negative for dysuria, frequency and urgency.  Musculoskeletal:  Negative for back pain, falls,  joint pain and myalgias.  Skin: Negative.   Neurological:  Negative for dizziness and headaches.  Psychiatric/Behavioral:  Negative for depression and memory loss. The patient does not have insomnia.     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)    PMS (premenstrual syndrome)    Past Surgical History:  Procedure Laterality Date   BREAST BIOPSY Right    benign   CHOLECYSTECTOMY     COLONOSCOPY  11/08/2018   Danis-polyps   COLONOSCOPY  12/05/2019   Danis   DILATION AND CURETTAGE OF UTERUS     FOOT SURGERY Right 2018   removal of melanoma   LUMBAR FUSION     lymph node removal Right 2018   groin    PERIPHERAL VASCULAR THROMBECTOMY Left 11/08/2023   Procedure: PERIPHERAL VASCULAR THROMBECTOMY;  Surgeon: Marea Selinda RAMAN, MD;  Location: [facility] INVASIVE CV LAB;  Service: Cardiovascular;  Laterality: Left;   POLYPECTOMY     TOTAL ABDOMINAL HYSTERECTOMY W/ BILATERAL SALPINGOOPHORECTOMY     TUBAL LIGATION     Social History:   reports that she has never smoked. She has never been exposed to tobacco smoke. She has never used smokeless tobacco. She reports that she does not currently use alcohol. She reports that she does not use drugs.  Family History  Problem Relation Age of Onset   Depression Mother    Anxiety disorder Mother    Bipolar disorder Mother    Diabetes Mother    Mental illness Mother    Heart disease Mother    Stroke Mother    Lung cancer Father    Diabetes Maternal Grandmother     Heart attack Maternal Grandmother    CVA Paternal Grandmother    Colon cancer Neg Hx    Liver cancer Neg Hx    Pancreatic cancer Neg Hx    Esophageal cancer Neg Hx    Stomach cancer Neg Hx    Rectal cancer Neg Hx    Colon polyps Neg Hx    Breast cancer Neg Hx     Medications: Patient's Medications  New Prescriptions   No medications on file  Previous Medications   ACETAMINOPHEN  (TYLENOL ) 325 MG TABLET    Take 650 mg by mouth every 4 (four) hours as needed.   ALBUTEROL  (PROVENTIL ) (2.5 MG/3ML) 0.083% NEBULIZER SOLUTION    Inhale 3 mLs (2.5 mg total) into the lungs every 4 (four) hours as needed for wheezing or shortness of breath.   AMITRIPTYLINE  (ELAVIL ) 50 MG TABLET    Take 1 tablet (50 mg total) by mouth at bedtime.  AMLODIPINE  (NORVASC ) 2.5 MG TABLET    Take 1 tablet (2.5 mg total) by mouth daily.   APIXABAN (ELIQUIS) 5 MG TABS TABLET    Take 1 tablet (5 mg total) by mouth 2 (two) times daily.   BLOOD GLUCOSE MONITORING SUPPL (ONETOUCH VERIO) W/DEVICE KIT    Use to check blood sugar 3 times a day and document results, bring to appointments.  Goal is <130 fasting blood sugar and <180 two hours after meals.   CHOLECALCIFEROL (VITAMIN D ) 50 MCG (2000 UT) CAPS    Take 1 capsule by mouth at bedtime.   CYANOCOBALAMIN (VITAMIN B-12 PO)    Take 1 tablet by mouth at bedtime.   EPINEPHRINE  (EPIPEN  2-PAK) 0.3 MG/0.3 ML IJ SOAJ INJECTION    Inject 0.3 mg into the muscle as needed for anaphylaxis.   FIBER ADULT GUMMIES PO    Take 1 tablet by mouth daily as needed.   FUROSEMIDE  (LASIX ) 40 MG TABLET    Take 1 tablet (40 mg total) by mouth daily.   HYDROCORTISONE  (CORTEF ) 20 MG TABLET    Take 1 tablet (20 mg total) by mouth 2 (two) times daily.   INSULIN  GLARGINE (LANTUS) 100 UNIT/ML SOLOSTAR PEN    Inject 8 Units into the skin daily.   LOSARTAN  (COZAAR ) 50 MG TABLET    Take 1 tablet (50 mg total) by mouth daily.   MECLIZINE  (ANTIVERT ) 25 MG TABLET    Take 1 tablet (25 mg total) by mouth 3 (three)  times daily as needed for dizziness.   METFORMIN  (GLUCOPHAGE ) 1000 MG TABLET    Take 1 tablet (1,000 mg total) by mouth 2 (two) times daily with a meal.   ONETOUCH ULTRASOFT 2 LANCETS MISC    USE TO CHECK BLOOD SUGAR 3 TIMES A DAY AND DOCUMENT RESULTS, BRING TO APPOINTMENTS. GOAL IS <130 FASTING BLOOD SUGAR AND <180 TWO HOURS AFTER MEALS.   PANTOPRAZOLE  (PROTONIX ) 40 MG TABLET    Take 1 tablet (40 mg total) by mouth daily.   POTASSIUM CHLORIDE  (KLOR-CON  M) 10 MEQ TABLET    Take 1 tablet (10 mEq total) by mouth daily for 5 days.   PREGABALIN  (LYRICA ) 150 MG CAPSULE    Take 1 capsule (150 mg total) by mouth 2 (two) times daily AND 2 capsules (300 mg total) at bedtime.   ROSUVASTATIN  (CRESTOR ) 10 MG TABLET    Take 1 tablet (10 mg total) by mouth daily.   SENNA-DOCUSATE (SENOKOT-S) 8.6-50 MG TABLET    Take 2 tablets by mouth 2 (two) times daily.   SUVOREXANT  (BELSOMRA ) 15 MG TABS    Take 1 tablet (15 mg total) by mouth at bedtime as needed.  Modified Medications   No medications on file  Discontinued Medications   No medications on file    Physical Exam:  Vitals:   11/25/23 1050 11/25/23 1053  BP: (!) 168/72 (!) 154/74  Pulse: 87   Temp: (!) 97.5 F (36.4 C)   SpO2: 96%   Weight: 240 lb 11.2 oz (109.2 kg)   Height: 5' 4 (1.626 m)    Body mass index is 41.32 kg/m. Wt Readings from Last 3 Encounters:  11/25/23 240 lb 11.2 oz (109.2 kg)  11/16/23 246 lb 9.6 oz (111.9 kg)  11/15/23 244 lb 9.6 oz (110.9 kg)    Physical Exam Constitutional:      General: She is not in acute distress.    Appearance: She is well-developed. She is not diaphoretic.  HENT:  Head: Normocephalic and atraumatic.     Mouth/Throat:     Pharynx: No oropharyngeal exudate.  Eyes:     Conjunctiva/sclera: Conjunctivae normal.     Pupils: Pupils are equal, round, and reactive to light.  Cardiovascular:     Rate and Rhythm: Normal rate and regular rhythm.     Heart sounds: Normal heart sounds.  Pulmonary:      Effort: Pulmonary effort is normal.     Breath sounds: Normal breath sounds.  Abdominal:     General: Bowel sounds are normal.     Palpations: Abdomen is soft.  Musculoskeletal:     Cervical back: Normal range of motion and neck supple.     Right lower leg: No edema.     Left lower leg: No edema.  Skin:    General: Skin is warm and dry.     Comments: Redness most notable on bilateral LE and back  Neurological:     Mental Status: She is alert.     Motor: Weakness present.  Psychiatric:        Mood and Affect: Mood normal.     Labs reviewed: Basic Metabolic Panel: Recent Labs    04/15/23 0931 08/30/23 1147 08/31/23 0514 09/01/23 0350 09/02/23 0316 09/04/23 1755 09/04/23 2300 09/05/23 0453 09/14/23 1209 09/15/23 0600 11/06/23 0427 11/08/23 0527 11/15/23 1450  NA 140   < > 135 137 141   < >  --    < > 143   < > 136 137 140  K 4.2   < > 4.5 4.3 4.2   < >  --    < > 3.7   < > 3.8 3.8 3.4*  CL 102   < > 105 104 105   < >  --    < > 98   < > 100 100 98  CO2 20   < > 20* 24 26   < >  --    < > 30   < > 24 26 27   GLUCOSE 180*   < > 244* 161* 143*   < >  --    < > 155*   < > 274* 300* 181*  BUN 22   < > 30* 27* 28*   < >  --    < > 20   < > 17 24* 15  CREATININE 1.11*   < > 1.65* 1.18* 1.00   < > 0.99   < > 1.77*   < > 0.82 0.79 0.83  CALCIUM  9.1   < > 8.0* 8.2* 8.3*   < >  --    < > 9.1   < > 8.5* 8.5* 8.8  MG  --    < > 1.7 2.1 2.2  --   --   --  1.6*  --   --  1.9  --   PHOS  --   --  3.1 2.5 2.3*  --   --   --   --   --   --   --   --   TSH 2.650  --   --   --   --   --  0.736  --   --   --   --   --   --    < > = values in this interval not displayed.   Liver Function Tests: Recent Labs    10/13/23 0905 11/08/23 0527 11/15/23 1450  AST 5 13* 8  ALT 9  12 16  ALKPHOS 76 49 63  BILITOT 0.3 0.3 0.3  PROT 5.5* 5.9* 5.6*  ALBUMIN 3.8 2.6* 3.7*   Recent Labs    08/30/23 1506 09/04/23 1755  LIPASE 22 35   No results for input(s): AMMONIA in the last 8760  hours. CBC: Recent Labs    09/06/23 0534 09/07/23 0530 09/14/23 1045 09/23/23 0000 11/05/23 1514 11/09/23 0417 11/10/23 0519 11/15/23 1502  WBC 13.7* 11.4*   < > 8.1   < > 8.9 10.2 10.4  NEUTROABS 10.3* 8.5*  --  3,451.00  --   --   --   --   HGB 9.8* 9.6*   < > 12.2   < > 8.8* 9.0* 10.5*  HCT 29.8* 29.7*   < > 38   < > 26.9* 28.1* 30.0*  MCV 88.4 88.1   < >  --    < > 87.6 88.4 86  PLT 183 184   < > 219   < > 232 240 218   < > = values in this interval not displayed.   Lipid Panel: Recent Labs    12/30/22 1328 04/15/23 0931 09/04/23 2300 10/13/23 0905  CHOL 140 140  --  136  HDL 51 58  --  57  LDLCALC 59 62  --  46  TRIG 182* 108 79 206*   TSH: Recent Labs    04/15/23 0931 09/04/23 2300  TSH 2.650 0.736   A1C: Lab Results  Component Value Date   HGBA1C 6.9 (H) 08/31/2023     Assessment/Plan Assessment and Plan Assessment & Plan Generalized skin rash and pruritus Rash resembling sunburn, primarily on legs and chest, with pruritus. Improved today.  Not currently on any antibiotics.  Will have her admit to SNF for ongoing monitoring  Diarrhea with decreased appetite and weakness Diarrhea for 4 with decreased appetite, minimal oral intake and weakness. Possible dehydration due to diarrhea and inadequate oral intake. Metformin  may contribute to diarrhea, especially with decreased food intake. - Ordered blood work to check electrolytes. - Encouraged oral intake to prevent dehydration. Admit to rehab for observation and assistant with meals.  Dc metformin  at this time.   Shortness of breath and cough Shortness of breath since yesterday, exacerbated by walking. Persistent cough disruptive to sleep. Possible dehydration contributing to symptoms. - Ordered chest x-ray to evaluate shortness of breath. - Performed COVID and flu tests which were negative   Chronic diastolic heart failure Recent increase in shortness of breath and leg swelling. Blood pressure  slightly elevated. Compression hose not consistently used, contributing to leg swelling. - Encouraged use of compression hose to manage leg swelling. Continues on lasix  Will get chest xray and bnp  Adrenal insufficiency Managed with Cortef  twice daily. Follow up cortisol level.   Type 2 diabetes mellitus Managed with metformin . Recent diarrhea may be exacerbated by metformin , especially with decreased food intake. -dc metformin  and monitor blood sugars BID>   Acute DVT Reports compliance with eliquis Continues BID   Sarah [name] K. Caro BODILY  [facility] [phone]

## 2023-11-26 ENCOUNTER — Telehealth: Payer: Self-pay | Admitting: Nurse Practitioner

## 2023-11-26 ENCOUNTER — Encounter: Payer: Self-pay | Admitting: Orthopedic Surgery

## 2023-11-26 ENCOUNTER — Non-Acute Institutional Stay (SKILLED_NURSING_FACILITY): Payer: Self-pay | Admitting: Orthopedic Surgery

## 2023-11-26 DIAGNOSIS — E271 Primary adrenocortical insufficiency: Secondary | ICD-10-CM | POA: Diagnosis not present

## 2023-11-26 DIAGNOSIS — R531 Weakness: Secondary | ICD-10-CM

## 2023-11-26 DIAGNOSIS — R197 Diarrhea, unspecified: Secondary | ICD-10-CM | POA: Diagnosis not present

## 2023-11-26 DIAGNOSIS — I5032 Chronic diastolic (congestive) heart failure: Secondary | ICD-10-CM | POA: Diagnosis not present

## 2023-11-26 DIAGNOSIS — I82412 Acute embolism and thrombosis of left femoral vein: Secondary | ICD-10-CM

## 2023-11-26 DIAGNOSIS — K1379 Other lesions of oral mucosa: Secondary | ICD-10-CM | POA: Diagnosis not present

## 2023-11-26 NOTE — Progress Notes (Deleted)
  Cardiology Office Note   Date:  11/26/2023  ID:  Sarah [name], DOB September 14, 1950, MRN [id number] PCP: Valerio Melanie DASEN, NP  [facility] Providers Cardiologist:  None { Click to update primary MD,subspecialty MD or APP then REFRESH:1}    History of Present Illness Sarah [name] is a 73 y.o. female PMH HTN, DM 2, reported Addison's disease, morbid obesity, unprovoked DVT on Eliquis who presents for dizziness and palpitations.  ***.  Last LDL 46 09/2023.  Relevant CVD History -Monitor pending -CTPA 08/2023 with aortic atherosclerosis and CAC -TTE 08/2023 LVEF 55 to 60%, grade 2 diastolic dysfunction, normal RV function, no significant valvular disease   ROS: Pt denies any chest discomfort, jaw pain, arm pain, palpitations, syncope, presyncope, orthopnea, PND, or LE edema.  Studies Reviewed I have independently reviewed the patient's ECG, ***.  Physical Exam VS:  There were no vitals taken for this visit.       Wt Readings from Last 3 Encounters:  11/26/23 242 lb 3.2 oz (109.9 kg)  11/25/23 240 lb 11.2 oz (109.2 kg)  11/16/23 246 lb 9.6 oz (111.9 kg)    GEN: No acute distress. NECK: No JVD; No carotid bruits. CARDIAC: ***RRR, no murmurs, rubs, gallops. RESPIRATORY:  Clear to auscultation. EXTREMITIES:  Warm and well-perfused. No edema.  ASSESSMENT AND PLAN Dizziness Palpitations CAC Aortic atherosclerosis HLD        {Are you ordering a CV Procedure (e.g. stress test, cath, DCCV, TEE, etc)?   Press F2        :[id number]}  Dispo: ***  Signed, Caron Poser, MD

## 2023-11-26 NOTE — Telephone Encounter (Signed)
 Called and notified Sarah [name] that [name] gave the ok for the requested start of care date.

## 2023-11-26 NOTE — Telephone Encounter (Signed)
 Copied from CRM #[id number]. Topic: Clinical - Home Health Verbal Orders >> Nov 26, 2023 10:34 AM Rosaria BRAVO wrote: Caller/Agency: [facility]  Callback Number: [phone] Service Requested: Physical Therapy Frequency:   Needs new start of care date because he was unable to reach the patient this week.  Requesting Monday start of care date.   Any new concerns about the patient? No

## 2023-11-26 NOTE — Progress Notes (Signed)
 Location:  Other [place].  Nursing Home Room Number: [facility] [id number] Place of Service:  SNF [phone]) Provider:  Greig Cluster, NP  PCP: Valerio Melanie DASEN, NP  Patient Care Team: Valerio Melanie DASEN, NP as PCP - General (Nurse Practitioner) [facility] (Optometry) Danis, Victory LITTIE MOULD, MD as Consulting Physician (Gastroenterology) Phebe Janina Pates, MD as Referring Physician (Endocrinology) Simers, Saralyn Caldron, NP as Nurse Practitioner (Neurology) [name], NP as Nurse Practitioner (Behavioral Health) [name], MD (Dermatology)  Extended Emergency Contact Information Primary Emergency Contact: Ferdie Iha  [place] Home Phone: [phone] Relation: Daughter Secondary Emergency Contact: Willard,Jay Mobile Phone: [phone] Relation: Other  Code Status:  Full Code Goals of care: Advanced Directive information    11/11/2023   11:26 AM  Advanced Directives  Does Patient Have a Medical Advance Directive? No  Would patient like information on creating a medical advance directive? No - Patient declined     Chief Complaint  Patient presents with   Rash    Follow up Sunburn Rash    HPI:  Pt is a 73 y.o. female seen today for acute visit due to ongoing rash.   She currently resides on the rehab unit at [facility]. PMH: HFpEF, DVT, HTN, Addison's disease, HLD, T2DM, osteopenia, anemia, B12 deficiency, obesity, malignant melanoma and depression.   11/05 developed severe rash resembling sunburn. She also had shortness of breath, poor appetite and ongoing diarrhea. She was not taking medications for a few days prior to symptoms. 11/06 she was readmitted to rehab due to weakness. Today, appetite has improved. Sunburn rash has started to fade. BNP was noted to be 155. CXR was negative for infiltrate or effusion. WBC was noted to be 15.5. Hgb 9.8, hct 30.0, glucose 221, BUN/creat 16/0.97, Na+ 140, K+ 3.7, calcium  8.8,  protein 5.8, AST/ALT 6/10. She was given extra furosemide  due to elevated BNP. Cortisol pending. Nursing called [facility] and was unable to get f/u appointment. Diarrhea ongoing. Denies blood in stool, N/V or abdominal pain. She reports having intermittent diarrhea > 2 weeks. She was recently hospitalized due to DVT. Afebrile. Vitals stable.    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)    PMS (premenstrual syndrome)    Past Surgical History:  Procedure Laterality Date   BREAST BIOPSY Right    benign   CHOLECYSTECTOMY     COLONOSCOPY  11/08/2018   Danis-polyps   COLONOSCOPY  12/05/2019   Danis   DILATION AND CURETTAGE OF UTERUS     FOOT SURGERY Right 2018   removal of melanoma   LUMBAR FUSION     lymph node removal Right 2018   groin    PERIPHERAL VASCULAR THROMBECTOMY Left 11/08/2023   Procedure: PERIPHERAL VASCULAR THROMBECTOMY;  Surgeon: Marea Selinda RAMAN, MD;  Location: [facility] INVASIVE [facility];  Service: Cardiovascular;  Laterality: Left;   POLYPECTOMY     TOTAL ABDOMINAL HYSTERECTOMY W/ BILATERAL SALPINGOOPHORECTOMY     TUBAL LIGATION      Allergies  Allergen Reactions   Clindamycin/Lincomycin Anaphylaxis   Doxycycline  Rash   Aspirin  Other (See Comments)    Pt is unsure of reaction, but states that she is allergic to aspirin    Ciprofloxacin Rash    very bad rash   Penicillins Rash    Has patient had a PCN reaction causing immediate rash, facial/tongue/throat swelling, SOB or  lightheadedness with hypotension: No Has patient had a PCN reaction causing severe rash involving mucus membranes or skin necrosis: No Has patient had a PCN reaction that required hospitalization: No Has patient had a PCN reaction occurring within the last 10 years: No If all of the above answers are NO, then may proceed with Cephalosporin use.     Outpatient Encounter Medications as of 11/26/2023  Medication Sig   acetaminophen  (TYLENOL ) 325 MG tablet Take 650 mg by mouth every 4 (four) hours as needed.   albuterol  (PROVENTIL ) (2.5 MG/3ML) 0.083% nebulizer solution Inhale 3 mLs (2.5 mg total) into the lungs every 4 (four) hours as needed for wheezing or shortness of breath.   amitriptyline  (ELAVIL ) 50 MG tablet Take 1 tablet (50 mg total) by mouth at bedtime.   amLODipine  (NORVASC ) 2.5 MG tablet Take 1 tablet (2.5 mg total) by mouth daily.   apixaban (ELIQUIS) 5 MG TABS tablet Take 1 tablet (5 mg total) by mouth 2 (two) times daily.   Blood Glucose Monitoring Suppl (ONETOUCH VERIO) w/Device KIT Use to check blood sugar 3 times a day and document results, bring to appointments.  Goal is <130 fasting blood sugar and <180 two hours after meals.   Cholecalciferol (VITAMIN D ) 50 MCG (2000 UT) CAPS Take 1 capsule by mouth at bedtime.   Cyanocobalamin (VITAMIN B-12 PO) Take 1 tablet by mouth at bedtime.   EPINEPHrine  (EPIPEN  2-PAK) 0.3 mg/0.3 mL IJ SOAJ injection Inject 0.3 mg into the muscle as needed for anaphylaxis.   FIBER ADULT GUMMIES PO Take 1 tablet by mouth daily as needed.   furosemide  (LASIX ) 40 MG tablet Take 1 tablet (40 mg total) by mouth daily. (Patient taking differently: Take 40 mg by mouth 2 (two) times daily.)   hydrocortisone  (CORTEF ) 20 MG tablet Take 1 tablet (20 mg total) by mouth 2 (two) times daily.   insulin  glargine (LANTUS) 100 UNIT/ML Solostar Pen Inject 8 Units into the skin daily.   losartan  (COZAAR ) 50 MG tablet Take 1 tablet (50 mg total) by mouth daily.    meclizine  (ANTIVERT ) 25 MG tablet Take 1 tablet (25 mg total) by mouth 3 (three) times daily as needed for dizziness.   OneTouch UltraSoft 2 Lancets MISC USE TO CHECK BLOOD SUGAR 3 TIMES A DAY AND DOCUMENT RESULTS, BRING TO APPOINTMENTS. GOAL IS <130 FASTING BLOOD SUGAR AND <180 TWO HOURS AFTER MEALS.   pantoprazole  (PROTONIX ) 40 MG tablet Take 1 tablet (40 mg total) by mouth daily.   pregabalin  (LYRICA ) 150 MG capsule Take 1 capsule (150 mg total) by mouth 2 (two) times daily AND 2 capsules (300 mg total) at bedtime.   rosuvastatin  (CRESTOR ) 10 MG tablet Take 1 tablet (10 mg total) by mouth daily.   senna-docusate (SENOKOT-S) 8.6-50 MG tablet Take 2 tablets by mouth 2 (two) times daily.   Suvorexant  (BELSOMRA ) 15 MG TABS Take 1 tablet (15 mg total) by mouth at bedtime as needed.  metFORMIN  (GLUCOPHAGE ) 1000 MG tablet Take 1 tablet (1,000 mg total) by mouth 2 (two) times daily with a meal. (Patient not taking: Reported on 11/26/2023)   potassium chloride  (KLOR-CON  M) 10 MEQ tablet Take 1 tablet (10 mEq total) by mouth daily for 5 days. (Patient not taking: Reported on 11/26/2023)   No facility-administered encounter medications on file as of 11/26/2023.    Review of Systems  Constitutional:  Positive for appetite change.  HENT: Negative.    Respiratory:  Positive for shortness of breath. Negative for cough.   Cardiovascular:  Positive for leg swelling. Negative for chest pain.  Gastrointestinal:  Positive for diarrhea. Negative for abdominal distention, abdominal pain, blood in stool, constipation, nausea and vomiting.  Genitourinary: Negative.   Musculoskeletal:  Positive for gait problem.  Skin:  Positive for color change and rash.  Neurological:  Positive for weakness. Negative for headaches.  Psychiatric/Behavioral:  Positive for confusion. Negative for dysphoric mood and sleep disturbance. The patient is not nervous/anxious.     Immunization History  Administered Date(s) Administered    Fluad Quad(high Dose 65+) 02/04/2021, 11/10/2021   Fluad Trivalent(High Dose 65+) 09/23/2022   INFLUENZA, HIGH DOSE SEASONAL PF 10/25/2023   Influenza Split 12/19/2010, 12/25/2011   Influenza,inj,Quad PF,6+ Mos 09/20/2014, 12/02/2015, 01/16/2019   Influenza-Unspecified 08/19/2017   PFIZER(Purple Top)SARS-COV-2 Vaccination 04/14/2019, 05/09/2019, 12/23/2019, 01/02/2020   Pneumococcal Conjugate-13 05/24/2015   Pneumococcal Polysaccharide-23 07/19/2009, 02/04/2021   Tdap 05/24/1992, 02/16/2013   Zoster, Live 02/28/2013   Pertinent  Health Maintenance Due  Topic Date Due   OPHTHALMOLOGY EXAM  04/19/2021   Colonoscopy  03/19/2024   HEMOGLOBIN A1C  03/02/2024   FOOT EXAM  09/13/2024   Mammogram  10/11/2024   DEXA SCAN  10/03/2026   Influenza Vaccine  Completed      09/29/2022    3:52 PM 12/30/2022    1:20 PM 09/14/2023   10:08 AM 10/13/2023    9:00 AM 11/15/2023    2:45 PM  Fall Risk  Falls in the past year? 1 0 1 1 0  Was there an injury with Fall? 0 0 0 0 0  Fall Risk Category Calculator 1 0 2 2 0  Patient at Risk for Falls Due to History of fall(s) No Fall Risks History of fall(s) History of fall(s);Impaired mobility No Fall Risks  Fall risk Follow up Falls prevention discussed;Falls evaluation completed;Education provided Falls evaluation completed Falls evaluation completed Falls evaluation completed Falls evaluation completed   Functional Status Survey:    Vitals:   11/26/23 1255  BP: (!) 170/74  Pulse: 75  Resp: 12  Temp: (!) 97.4 F (36.3 C)  SpO2: 95%  Weight: 242 lb 3.2 oz (109.9 kg)  Height: 5' 4 (1.626 m)   Body mass index is 41.57 kg/m. Physical Exam Vitals reviewed.  Constitutional:      General: She is not in acute distress. HENT:     Head: Normocephalic.  Eyes:     General:        Right eye: No discharge.        Left eye: No discharge.  Cardiovascular:     Rate and Rhythm: Normal rate and regular rhythm.     Pulses: Normal pulses.     Heart  sounds: Normal heart sounds.  Pulmonary:     Effort: Pulmonary effort is normal. No respiratory distress.     Breath sounds: Normal breath sounds. No wheezing or rales.  Abdominal:     General: Bowel sounds are normal. There  is no distension.     Palpations: Abdomen is soft. There is no mass.     Tenderness: There is no abdominal tenderness. There is no guarding or rebound.     Hernia: No hernia is present.  Musculoskeletal:     Cervical back: Neck supple.     Right lower leg: Edema present.     Left lower leg: Edema present.     Comments: BLE 1+ pitting, compression stocking on  Skin:    General: Skin is warm.     Capillary Refill: Capillary refill takes less than 2 seconds.  Neurological:     General: No focal deficit present.     Mental Status: She is alert and oriented to person, place, and time.     Motor: Weakness present.     Gait: Gait abnormal.  Psychiatric:        Mood and Affect: Mood normal.     Labs reviewed: Recent Labs    08/31/23 0514 09/01/23 0350 09/02/23 0316 09/04/23 1755 09/14/23 1209 09/15/23 0600 11/08/23 0527 11/15/23 1450 11/25/23 1357  NA 135 137 141   < > 143   < > 137 140 140  K 4.5 4.3 4.2   < > 3.7   < > 3.8 3.4* 3.7  CL 105 104 105   < > 98   < > 100 98 99  CO2 20* 24 26   < > 30   < > 26 27 26   GLUCOSE 244* 161* 143*   < > 155*   < > 300* 181* 221*  BUN 30* 27* 28*   < > 20   < > 24* 15 16  CREATININE 1.65* 1.18* 1.00   < > 1.77*   < > 0.79 0.83 0.97  CALCIUM  8.0* 8.2* 8.3*   < > 9.1   < > 8.5* 8.8 8.8  MG 1.7 2.1 2.2  --  1.6*  --  1.9  --   --   PHOS 3.1 2.5 2.3*  --   --   --   --   --   --    < > = values in this interval not displayed.   Recent Labs    10/13/23 0905 11/08/23 0527 11/15/23 1450 11/25/23 1357  AST 5 13* 8 6*  ALT 9 12 16 10   ALKPHOS 76 49 63  --   BILITOT 0.3 0.3 0.3 0.6  PROT 5.5* 5.9* 5.6* 5.8*  ALBUMIN 3.8 2.6* 3.7*  --    Recent Labs    09/07/23 0530 09/14/23 1045 09/23/23 0000 11/05/23 1514  11/10/23 0519 11/15/23 1502 11/25/23 1357  WBC 11.4*   < > 8.1   < > 10.2 10.4 15.5*  NEUTROABS 8.5*  --  3,451.00  --   --   --  13,919*  HGB 9.6*   < > 12.2   < > 9.0* 10.5* 9.8*  HCT 29.7*   < > 38   < > 28.1* 30.0* 30.0*  MCV 88.1   < >  --    < > 88.4 86 86.2  PLT 184   < > 219   < > 240 218 343   < > = values in this interval not displayed.   Lab Results  Component Value Date   TSH 0.736 09/04/2023   Lab Results  Component Value Date   HGBA1C 6.9 (H) 08/31/2023   Lab Results  Component Value Date   CHOL 136 10/13/2023   HDL 57  10/13/2023   LDLCALC 46 10/13/2023   TRIG 206 (H) 10/13/2023   CHOLHDL 4.1 04/23/2020    Significant Diagnostic Results in last 30 days:  DG Elbow Complete Right Result Date: 11/19/2023 EXAM: 3 VIEW(S) XRAY OF THE RIGHT ELBOW COMPARISON: None available. CLINICAL HISTORY: Pain. FINDINGS: BONES AND JOINTS: No acute fracture. No focal osseous lesion. No joint dislocation. No joint effusion. SOFT TISSUES: The soft tissues are unremarkable. IMPRESSION: 1. No acute abnormality. Electronically signed by: Morgane Naveau MD 11/19/2023 03:54 AM EDT RP Workstation: [id number]   DG Shoulder Right Result Date: 11/19/2023 EXAM: 1 VIEW XRAY OF THE RIGHT SHOULDER 11/18/2023 08:33:54 AM COMPARISON: None available. CLINICAL HISTORY: pain FINDINGS: BONES AND JOINTS: Glenohumeral joint is normally aligned. No acute fracture or dislocation. [facility] joint is unremarkable in appearance. Mild degenerative changes are present. SOFT TISSUES: No abnormal calcifications. Visualized lung is unremarkable. IMPRESSION: 1. No acute findings. Electronically signed by: Morgane Naveau MD 11/19/2023 03:53 AM EDT RP Workstation: [id number]   PERIPHERAL VASCULAR CATHETERIZATION Result Date: 11/08/2023 See surgical note for result.  CT Angio Chest PE W/Cm &/Or Wo Cm Result Date: 11/05/2023 EXAM: CTA CHEST 11/05/2023 06:04:06 PM TECHNIQUE: CTA of the chest was performed with and  without the administration of 50 mL of intravenous contrast. Multiplanar reformatted images are provided for review. MIP images are provided for review. Automated exposure control, iterative reconstruction, and/or weight based adjustment of the mA/kV was utilized to reduce the radiation dose to as low as reasonably achievable. COMPARISON: Chest x-ray and CT 09/04/2023. CLINICAL HISTORY: Pulmonary embolism (PE) suspected, low to intermediate prob, positive D-dimer. Pt comes with sob since Tuesday and sever swelling in left leg. Pt states wheeping from leg too. Pt states more exertion when walking. FINDINGS: PULMONARY ARTERIES: Pulmonary arteries are adequately opacified for evaluation. No acute pulmonary embolus. Main pulmonary artery is normal in caliber. MEDIASTINUM: Coronary artery and aortic atherosclerotic calcification. The heart and pericardium demonstrate no acute abnormality. There is no acute abnormality of the thoracic aorta. LYMPH NODES: No mediastinal, hilar or axillary lymphadenopathy. LUNGS AND PLEURA: The lungs are without acute process. No focal consolidation or pulmonary edema. No evidence of pleural effusion or pneumothorax. UPPER ABDOMEN: Limited images of the upper abdomen demonstrate hepatic steatosis. Otherwise, the visualized portions are unremarkable. SOFT TISSUES AND BONES: No acute bone or soft tissue abnormality. IMPRESSION: 1. No pulmonary embolism. Electronically signed by: Norman Gatlin MD 11/05/2023 06:39 PM EDT RP Workstation: [id number]   US  Venous Img Lower Unilateral Left Result Date: 11/05/2023 CLINICAL DATA:  Left lower extremity edema for 4 days EXAM: LEFT LOWER EXTREMITY VENOUS DOPPLER ULTRASOUND TECHNIQUE: Gray-scale sonography with graded compression, as well as color Doppler and duplex ultrasound were performed to evaluate the lower extremity deep venous systems from the level of the common femoral vein and including the common femoral, femoral, profunda femoral,  popliteal and calf veins including the posterior tibial, peroneal and gastrocnemius veins when visible. The superficial great saphenous vein was also interrogated. Spectral Doppler was utilized to evaluate flow at rest and with distal augmentation maneuvers in the common femoral, femoral and popliteal veins. COMPARISON:  None Available. FINDINGS: Contralateral Common Femoral Vein: Respiratory phasicity is normal and symmetric with the symptomatic side. No evidence of thrombus. Normal compressibility. Common Femoral Vein: Occlusive thrombus. The vessel is noncompressible with no Doppler flow detected. Saphenofemoral Junction: Occlusive thrombus with no Doppler flow detected. Profunda Femoral Vein: Occlusive thrombus. The vessel is noncompressible with no Doppler flow detected. Femoral Vein: Occlusive thrombus. The  vessel is noncompressible with no Doppler flow detected. Popliteal Vein: Occlusive thrombus, the vessel is noncompressible with no Doppler flow detected. Calf Veins: No evidence of thrombus, though evaluation is limited due to subcutaneous edema. Superficial Great Saphenous Vein: Near occlusive thrombus is identified, with only a small segment of the saphenous vein demonstrating Doppler flow. Other Findings:  None. IMPRESSION: 1. Extensive acute appearing deep venous thrombosis throughout the entirety of the left lower extremity as above. Critical Value/emergent results were called by telephone at the time of interpretation on 11/05/2023 at 5:55 pm to provider LORELLE CHEADLE, who verbally acknowledged these results. Electronically Signed   By: Ozell Daring M.D.   On: 11/05/2023 17:59   DG Chest 2 View Result Date: 11/05/2023 CLINICAL DATA:  Shortness of breath. EXAM: CHEST - 2 VIEW COMPARISON:  09/14/2023. FINDINGS: Low lung volume. Bilateral lung fields are clear. Bilateral costophrenic angles are clear. Stable cardio-mediastinal silhouette. No acute osseous abnormalities. The soft tissues are within  normal limits. IMPRESSION: No active cardiopulmonary disease. Electronically Signed   By: Ree Molt M.D.   On: 11/05/2023 15:04    Assessment/Plan 1. Chronic heart failure with preserved ejection fraction (HFpEF) (HCC) (Primary) - recent BNP 155 (11/06)> was 38 (10/17) - mild sob, 1+ pitting edema - CXR negative for infiltrate or effusion - extra furosemide  40 mg x 2 doses given - cont furosemide  daily  - cont losartan  and amlodipdine  2. Adrenal insufficiency (Addison's disease) (HCC) - 11/05 sun tan appearance - followed by Dr. Phebe with [facility]> unable to get f/u - cortisol level pending - did not take medication for a few days - no sun tan appearance today - cont hydrocortisone   3. Diarrhea, unspecified type - ongoing x 2 weeks - abdomen soft, BS + x 4 - stool pcr - consider KUB  4. Mouth sores - start Abeva  5. Acute deep vein thrombosis (DVT) of femoral vein of left lower extremity (HCC) - hospitalized 10/17-10/22 - cont Eliquis> recommended course 6-12 months  6. Weakness generalized - cont PT/OT    Family/ staff Communication: plan discussed with patient  Labs/tests ordered:  cortisol level pending, stool pcr

## 2023-11-28 MED ORDER — FUROSEMIDE 40 MG PO TABS
40.0000 mg | ORAL_TABLET | Freq: Every day | ORAL | Status: AC
Start: 2023-11-28 — End: ?

## 2023-11-29 LAB — COMPREHENSIVE METABOLIC PANEL WITH GFR
Calcium: 8.1 — AB (ref 8.7–10.7)
eGFR: 63

## 2023-11-29 LAB — BASIC METABOLIC PANEL WITH GFR
BUN: 18 (ref 4–21)
CO2: 34 — AB (ref 13–22)
Chloride: 97 — AB (ref 99–108)
Creatinine: 1 (ref 0.5–1.1)
Glucose: 187
Potassium: 3.1 meq/L — AB (ref 3.5–5.1)
Sodium: 142 (ref 137–147)

## 2023-11-29 LAB — CBC AND DIFFERENTIAL
HCT: 33 — AB (ref 36–46)
Hemoglobin: 10.7 — AB (ref 12.0–16.0)
Neutrophils Absolute: 4752
Platelets: 355 K/uL (ref 150–400)
WBC: 7.2

## 2023-11-29 LAB — CBC: RBC: 3.81 — AB (ref 3.87–5.11)

## 2023-11-30 ENCOUNTER — Ambulatory Visit

## 2023-12-01 LAB — CBC WITH DIFFERENTIAL/PLATELET
Absolute Lymphocytes: 729 {cells}/uL — ABNORMAL LOW (ref 850–3900)
Absolute Monocytes: 636 {cells}/uL (ref 200–950)
Basophils Absolute: 31 {cells}/uL (ref 0–200)
Basophils Relative: 0.2 %
Eosinophils Absolute: 186 {cells}/uL (ref 15–500)
Eosinophils Relative: 1.2 %
HCT: 30 % — ABNORMAL LOW (ref 35.0–45.0)
Hemoglobin: 9.8 g/dL — ABNORMAL LOW (ref 11.7–15.5)
MCH: 28.2 pg (ref 27.0–33.0)
MCHC: 32.7 g/dL (ref 32.0–36.0)
MCV: 86.2 fL (ref 80.0–100.0)
MPV: 11.1 fL (ref 7.5–12.5)
Monocytes Relative: 4.1 %
Neutro Abs: 13919 {cells}/uL — ABNORMAL HIGH (ref 1500–7800)
Neutrophils Relative %: 89.8 %
Platelets: 343 Thousand/uL (ref 140–400)
RBC: 3.48 Million/uL — ABNORMAL LOW (ref 3.80–5.10)
RDW: 13.5 % (ref 11.0–15.0)
Total Lymphocyte: 4.7 %
WBC: 15.5 Thousand/uL — ABNORMAL HIGH (ref 3.8–10.8)

## 2023-12-01 LAB — COMPREHENSIVE METABOLIC PANEL WITH GFR
AG Ratio: 1.8 (calc) (ref 1.0–2.5)
ALT: 10 U/L (ref 6–29)
AST: 6 U/L — ABNORMAL LOW (ref 10–35)
Albumin: 3.7 g/dL (ref 3.6–5.1)
Alkaline phosphatase (APISO): 63 U/L (ref 37–153)
BUN: 16 mg/dL (ref 7–25)
CO2: 26 mmol/L (ref 20–32)
Calcium: 8.8 mg/dL (ref 8.6–10.4)
Chloride: 99 mmol/L (ref 98–110)
Creat: 0.97 mg/dL (ref 0.60–1.00)
Globulin: 2.1 g/dL (ref 1.9–3.7)
Glucose, Bld: 221 mg/dL — ABNORMAL HIGH (ref 65–99)
Potassium: 3.7 mmol/L (ref 3.5–5.3)
Sodium: 140 mmol/L (ref 135–146)
Total Bilirubin: 0.6 mg/dL (ref 0.2–1.2)
Total Protein: 5.8 g/dL — ABNORMAL LOW (ref 6.1–8.1)
eGFR: 62 mL/min/1.73m2 (ref >=60)

## 2023-12-01 LAB — CORTISOL, FREE: Cortisol Free, Ser: 0.56 ug/dL

## 2023-12-02 ENCOUNTER — Encounter: Payer: Self-pay | Admitting: Internal Medicine

## 2023-12-02 ENCOUNTER — Emergency Department
Admission: EM | Admit: 2023-12-02 | Discharge: 2023-12-02 | Disposition: A | Discharge status: Home / Self Care | Attending: Emergency Medicine | Admitting: Emergency Medicine

## 2023-12-02 ENCOUNTER — Non-Acute Institutional Stay (SKILLED_NURSING_FACILITY): Payer: Self-pay | Admitting: Internal Medicine

## 2023-12-02 ENCOUNTER — Emergency Department

## 2023-12-02 ENCOUNTER — Other Ambulatory Visit: Payer: Self-pay

## 2023-12-02 DIAGNOSIS — Z86718 Personal history of other venous thrombosis and embolism: Secondary | ICD-10-CM

## 2023-12-02 DIAGNOSIS — I5032 Chronic diastolic (congestive) heart failure: Secondary | ICD-10-CM

## 2023-12-02 DIAGNOSIS — H5789 Other specified disorders of eye and adnexa: Secondary | ICD-10-CM

## 2023-12-02 DIAGNOSIS — R5381 Other malaise: Secondary | ICD-10-CM

## 2023-12-02 DIAGNOSIS — N189 Chronic kidney disease, unspecified: Secondary | ICD-10-CM | POA: Insufficient documentation

## 2023-12-02 DIAGNOSIS — E271 Primary adrenocortical insufficiency: Secondary | ICD-10-CM

## 2023-12-02 DIAGNOSIS — Z794 Long term (current) use of insulin: Secondary | ICD-10-CM

## 2023-12-02 DIAGNOSIS — I5030 Unspecified diastolic (congestive) heart failure: Secondary | ICD-10-CM | POA: Diagnosis not present

## 2023-12-02 DIAGNOSIS — E669 Obesity, unspecified: Secondary | ICD-10-CM | POA: Diagnosis not present

## 2023-12-02 DIAGNOSIS — Z85820 Personal history of malignant melanoma of skin: Secondary | ICD-10-CM | POA: Insufficient documentation

## 2023-12-02 DIAGNOSIS — Z6841 Body Mass Index (BMI) 40.0 and over, adult: Secondary | ICD-10-CM | POA: Diagnosis not present

## 2023-12-02 DIAGNOSIS — E119 Type 2 diabetes mellitus without complications: Secondary | ICD-10-CM | POA: Diagnosis not present

## 2023-12-02 DIAGNOSIS — I130 Hypertensive heart and chronic kidney disease with heart failure and stage 1 through stage 4 chronic kidney disease, or unspecified chronic kidney disease: Secondary | ICD-10-CM | POA: Insufficient documentation

## 2023-12-02 DIAGNOSIS — L03213 Periorbital cellulitis: Secondary | ICD-10-CM | POA: Insufficient documentation

## 2023-12-02 LAB — CBC WITH DIFFERENTIAL/PLATELET
Abs Immature Granulocytes: 0.38 K/uL — ABNORMAL HIGH (ref 0.00–0.07)
Basophils Absolute: 0.1 K/uL (ref 0.0–0.1)
Basophils Relative: 1 %
Eosinophils Absolute: 0.1 K/uL (ref 0.0–0.5)
Eosinophils Relative: 1 %
HCT: 36.4 % (ref 36.0–46.0)
Hemoglobin: 11.5 g/dL — ABNORMAL LOW (ref 12.0–15.0)
Immature Granulocytes: 3 %
Lymphocytes Relative: 9 %
Lymphs Abs: 1.4 K/uL (ref 0.7–4.0)
MCH: 27.6 pg (ref 26.0–34.0)
MCHC: 31.6 g/dL (ref 30.0–36.0)
MCV: 87.3 fL (ref 80.0–100.0)
Monocytes Absolute: 0.7 K/uL (ref 0.1–1.0)
Monocytes Relative: 4 %
Neutro Abs: 12.3 K/uL — ABNORMAL HIGH (ref 1.7–7.7)
Neutrophils Relative %: 82 %
Platelets: 384 K/uL (ref 150–400)
RBC: 4.17 MIL/uL (ref 3.87–5.11)
RDW: 14.2 % (ref 11.5–15.5)
WBC: 14.9 K/uL — ABNORMAL HIGH (ref 4.0–10.5)
nRBC: 0.1 % (ref 0.0–0.2)

## 2023-12-02 LAB — BASIC METABOLIC PANEL WITH GFR
Anion gap: 12 (ref 5–15)
BUN: 14 mg/dL (ref 8–23)
CO2: 31 mmol/L (ref 22–32)
Calcium: 8.8 mg/dL — ABNORMAL LOW (ref 8.9–10.3)
Chloride: 99 mmol/L (ref 98–111)
Creatinine, Ser: 0.86 mg/dL (ref 0.44–1.00)
GFR, Estimated: >60 mL/min (ref >60)
Glucose, Bld: 254 mg/dL — ABNORMAL HIGH (ref 70–99)
Potassium: 3.3 mmol/L — ABNORMAL LOW (ref 3.5–5.1)
Sodium: 143 mmol/L (ref 135–145)

## 2023-12-02 MED ORDER — IOHEXOL 300 MG/ML  SOLN
75.0000 mL | Freq: Once | INTRAMUSCULAR | Status: AC | PRN
  Administered 2023-12-02: 75 mL via INTRAVENOUS

## 2023-12-02 MED ORDER — CEPHALEXIN 500 MG PO CAPS
500.0000 mg | ORAL_CAPSULE | Freq: Four times a day (QID) | ORAL | 0 refills | Status: AC
Start: 2023-12-02 — End: 2023-12-09

## 2023-12-02 MED ORDER — SULFAMETHOXAZOLE-TRIMETHOPRIM 800-160 MG PO TABS: 1.0000 | ORAL_TABLET | Freq: Two times a day (BID) | ORAL | 0 refills | Status: AC

## 2023-12-02 MED ORDER — SODIUM CHLORIDE 0.9 % IV SOLN
2.0000 g | INTRAVENOUS | Status: DC
  Administered 2023-12-02: 2 g via INTRAVENOUS
  Filled 2023-12-02: qty 20

## 2023-12-02 NOTE — Assessment & Plan Note (Addendum)
 Patient remains on hydrocortisone  20 mg twice daily.

## 2023-12-02 NOTE — ED Notes (Signed)
 Pt's son here to get her and take her back to facility. Pt CAOx4, report given to pt's son.

## 2023-12-02 NOTE — Assessment & Plan Note (Addendum)
 Patient had an extensive DVT of her left leg requiring mechanical thrombectomy during her October 2025 admission. She remains on Eliquis 5 mg twice daily.

## 2023-12-02 NOTE — Assessment & Plan Note (Addendum)
 Continue with physical therapy and efforts to get the patient back to independent living status.  Patient is a resident at [facility].

## 2023-12-02 NOTE — ED Provider Notes (Signed)
 [facility] Provider Note    Event Date/Time   First MD Initiated Contact with Patient 12/02/23 1112     (approximate)   History   Eye Pain   HPI  Sarah [name] is a 73 y.o. female who presents today for evaluation of left eye swelling and redness.  Patient reports that 2 days ago she had what she thought was a stye, but it has worsened and now she has redness around her eye.  She has no pain with moving her eyes.  No visual changes.  No fevers or chills.  Patient Active Problem List   Diagnosis Date Noted   Swelling of left eye 12/02/2023   History of deep vein thrombosis (DVT) of lower extremity 12/02/2023   Debility 12/02/2023   Acute pain of right shoulder 11/15/2023   Morbid obesity with BMI of 40.0-44.9, adult (HCC) 11/10/2023   Anemia of chronic disease 11/10/2023   Cerebral infarction, remote, resolved 10/13/2023   Type 2 diabetes mellitus in patient with obesity (HCC) 09/19/2023   Leukocytosis 09/14/2023   (HFpEF) heart failure with preserved ejection fraction (HCC) 09/14/2023   Acute kidney injury superimposed on chronic kidney disease 09/14/2023   Hypotension 09/14/2023   Obesity 09/20/2022   Osteopenia of neck of right femur 10/02/2021   Cervical radiculitis 07/11/2021   Vitamin D  deficiency 02/05/2021   Adrenal insufficiency (Addison's disease) (HCC) 02/04/2021   Chronic lower back pain 02/02/2021   Insomnia due to other mental disorder 02/02/2021   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   B12 deficiency 02/01/2018   History of antineoplastic chemotherapy 10/17/2017   Prolonged Q-T interval on ECG 09/04/2017   History of malignant melanoma 01/07/2017   Recurrent major depression in partial remission 12/26/2011   Hypertension associated with diabetes (HCC) 09/17/2010   Hyperlipidemia associated with type 2 diabetes mellitus (HCC) 09/17/2010          Physical Exam   Triage Vital Signs: ED Triage Vitals  Encounter Vitals  Group     BP 12/02/23 1030 (!) 112/95     Girls Systolic BP Percentile --      Girls Diastolic BP Percentile --      Boys Systolic BP Percentile --      Boys Diastolic BP Percentile --      Pulse Rate 12/02/23 1029 74     Resp 12/02/23 1029 16     Temp 12/02/23 1029 98.7 F (37.1 C)     Temp Source 12/02/23 1029 Oral     SpO2 12/02/23 1029 96 %     Weight --      Height --      Head Circumference --      Peak Flow --      Pain Score 12/02/23 1028 8     Pain Loc --      Pain Education --      Exclude from Growth Chart --     Most recent vital signs: Vitals:   12/02/23 1030 12/02/23 1117  BP: (!) 112/95   Pulse:    Resp:    Temp:    SpO2:  97%    Physical Exam Vitals and nursing note reviewed.  Constitutional:      General: Awake and alert. No acute distress.    Appearance: Normal appearance. The patient is normal weight.  HENT:     Head: Normocephalic and atraumatic.     Mouth: Mucous membranes are moist.  Eyes:     General: PERRL. Normal  EOMs        Right eye: No discharge.        Left eye: No discharge.  Erythema around her left eye including eyelid.  Normal globe.  No scleral injection.  Normal extraocular movements.    Conjunctiva/sclera: Conjunctivae normal.  Cardiovascular:     Rate and Rhythm: Normal rate.     Pulses: Normal pulses.  Pulmonary:     Effort: Pulmonary effort is normal. No respiratory distress.     Breath sounds: Normal breath sounds.  Abdominal:     Abdomen is soft. There is no abdominal tenderness. No rebound or guarding. No distention. Musculoskeletal:        General: No swelling. Normal range of motion.     Cervical back: Normal range of motion and neck supple.  Skin:    General: Skin is warm and dry.     Capillary Refill: Capillary refill takes less than 2 seconds.     Findings: No rash.  Neurological:     Mental Status: The patient is awake and alert.      ED Results / Procedures / Treatments   Labs (all labs ordered are  listed, but only abnormal results are displayed) Labs Reviewed  CBC WITH DIFFERENTIAL/PLATELET - Abnormal; Notable for the following components:      Result Value   WBC 14.9 (*)    Hemoglobin 11.5 (*)    Neutro Abs 12.3 (*)    Abs Immature Granulocytes 0.38 (*)    All other components within normal limits  BASIC METABOLIC PANEL WITH GFR - Abnormal; Notable for the following components:   Potassium 3.3 (*)    Glucose, Bld 254 (*)    Calcium  8.8 (*)    All other components within normal limits     EKG     RADIOLOGY I independently reviewed and interpreted imaging and agree with radiologists findings.     PROCEDURES:  Critical Care performed:   Procedures   MEDICATIONS ORDERED IN ED: Medications  cefTRIAXone  (ROCEPHIN ) 2 g in sodium chloride  0.9 % 100 mL IVPB (0 g Intravenous Stopped 12/02/23 1334)  iohexol  (OMNIPAQUE ) 300 MG/ML solution 75 mL (75 mLs Intravenous Contrast Given 12/02/23 1236)     IMPRESSION / MDM / ASSESSMENT AND PLAN / ED COURSE  I reviewed the triage vital signs and the nursing notes.   Differential diagnosis includes, but is not limited to, blepharitis, preseptal cellulitis, orbital cellulitis.  Patient is awake and alert, hemodynamically stable and afebrile.  She is nontoxic in appearance.  She has periorbital erythema, though full and normal extraocular movements without pain.  There is no proptosis.  No globe abnormalities.  Labs obtained revealing leukocytosis.  CT orbit reveals a uncomplicated preseptal cellulitis.  She is diabetic and we discussed the importance of glucose control.  Considered admission given that she is diabetic, however it is not unreasonable to trial oral antibiotics first.  Additionally, she prefers to be discharged on oral antibiotics though she was given a dose of Rocephin  in the ER.  We discussed strict return precautions and the importance of close outpatient follow-up for recheck.  Patient understands and agrees with  plan.  She was discharged in stable condition.   Patient's presentation is most consistent with acute complicated illness / injury requiring diagnostic workup.     FINAL CLINICAL IMPRESSION(S) / ED DIAGNOSES   Final diagnoses:  Preseptal cellulitis of left eye     Rx / DC Orders   ED Discharge Orders  Ordered    cephALEXin  (KEFLEX ) 500 MG capsule  4 times daily        12/02/23 1344    sulfamethoxazole-trimethoprim (BACTRIM DS) 800-160 MG tablet  2 times daily        12/02/23 1344             Note:  This document was prepared using Dragon voice recognition software and may include unintentional dictation errors.   Willmar Stockinger E, PA-C 12/02/23 1433    Dicky Anes, MD 12/02/23 [phone]

## 2023-12-02 NOTE — Assessment & Plan Note (Addendum)
Body mass index is 41.16 kg/m.

## 2023-12-02 NOTE — Discharge Instructions (Addendum)
Please take the antibiotics as prescribed.  Please return for any new, worsening, or changing symptoms or other concerns.  It was a pleasure caring for you today.

## 2023-12-02 NOTE — Assessment & Plan Note (Addendum)
 Patient had a mildly elevated BNP when she was admitted to [facility] again.  She was diuresed.  She has stable edema of her lower extremities.

## 2023-12-02 NOTE — ED Triage Notes (Signed)
 Patient to ED via [facility] for left eye pain, drainage and swelling x 2 days.

## 2023-12-02 NOTE — Assessment & Plan Note (Addendum)
 Patient states that she wants to learn how to give her own Lantus shots.  Patient does live in independent living.  Will have the SNF nurse teach her how to give herself Lantus pen injections.

## 2023-12-02 NOTE — Assessment & Plan Note (Addendum)
 Unable to properly evaluate her eye in SNF setting.  While the left globe of the eye does not feel hard on palpation, I think she needs to have her intraocular pressures measured with a tonometer.  She also likely needs a CT of her face to rule out pre versus post septal cellulitis.  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  She does not have any other vesicular lesions that I can see.  She does have a severe clindamycin allergy as she had bright red rash with denuding of the skin and an anaphylaxis reaction requiring ICU admission in August 2025.  I think I have to send her to the ER for an expedited evaluation.  Patient has been informed and agrees.  Will coordinate with the nurse on the floor to get her to the ER today.

## 2023-12-02 NOTE — Progress Notes (Signed)
 [facility] Admission H&P  Provider: Laurence Locus MD Location:  Other [facility]) Nursing Home Room Number: [facility]  117-A Place of Service:  SNF (31)   PCP: Cannady, [name] T, NP Patient Care Team: Valerio Melanie DASEN, NP as PCP - General (Nurse Practitioner) [facility] (Optometry) Danis, Victory LITTIE MOULD, MD as Consulting Physician (Gastroenterology) Phebe Janina Pates, MD as Referring Physician (Endocrinology) Simers, Saralyn Caldron, NP as Nurse Practitioner (Neurology) [name], NP as Nurse Practitioner (Behavioral Health) [name], MD (Dermatology)   Extended Emergency Contact Information Primary Emergency Contact: Willard,Kimberly  [place] Home Phone: [phone] Relation: Daughter Secondary Emergency Contact: Willard,Jay Mobile Phone: [phone] Relation: Other   Goals of Care: Advanced Directive information    12/02/2023    8:40 AM  Advanced Directives  Does Patient Have a Medical Advance Directive? No  Would patient like information on creating a medical advance directive? No - Patient declined    CODE STATUS: Full Code    Chief Complaint  Patient presents with   New Admit To SNF     HPI: Patient is a 73 y.o. female seen today for admission to [facility].  Patient known to me from her prior admission.  She had been admitted to [facility] on November 10, 2023. She presented to the hospital on 11/05/2023 after having right leg pain and swelling for about 4 days prior to admission. She was noted to have a very large DVT noted on lower extremity ultrasound that encompassed the common femoral, saphenofemoral junction, profunda, popliteal and calf veins. Vascular surgery was consulted. 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 She went underwent mechanical thrombectomy on 11/08/2018. She had been on IV heparin  prior to surgery and then converted over  to p.o. Eliquis after surgery. She was discharged from the hospital and admitted to acute rehab at [facility]. She has been doing well since admission. Pain is minimal. She still having residual edema of her left leg. She has had right lower extremity edema since her prior lymph node dissection on her right leg for her melanoma many years ago.   She was subsequently discharged from [facility] after completing therapy back to independent living within the [facility] community.  She was seen in follow-up in the [facility] independent living clinic on November 25, 2023 for rash and diarrhea.  Given how bad she looked, she was admitted back to [facility] skilled nursing care for evaluation.  At that time she had been complaining of diarrhea.  Had had very little to eat.  She had developed cold sores on her face and lips after she was discharged to home.  She does have a history of adrenal insufficiency and takes Cortef  twice a day.  She was readmitted back to [facility] on November 25, 2023.  She had labs drawn and she was gently diuresed due to a mildly elevated BNP.  Over the course the last 2 to 3 days, she has developed erythema, drainage of her left eye.  She has left upper eyelid edema.  She has some blurry vision of her left eye.  She denies any history of glaucoma.  She uses glasses to read.  The fever blisters/cold sores on her lips are healing.  She has no prior history of shingles.  Over the last 24 hours, she states that her swelling in her left eye has  increased a lot.  She woke up with drainage from the medial canthus this morning.  This is a comprehensive admission note to this SNFperformed on this date less than 30 days from date of admission. Included are preadmission medical/surgical history; reconciled medication list; family history; social history and comprehensive review of systems.  Corrections and additions to the records were documented. Comprehensive physical exam was also  performed. Additionally a clinical summary was entered for each active diagnosis pertinent to this admission in the Problem List to enhance continuity of care.   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)    PMS (premenstrual syndrome)    Past Surgical History:  Procedure Laterality Date   BREAST BIOPSY Right    benign   CHOLECYSTECTOMY     COLONOSCOPY  11/08/2018   Danis-polyps   COLONOSCOPY  12/05/2019   Danis   DILATION AND CURETTAGE OF UTERUS     FOOT SURGERY Right 2018   removal of melanoma   LUMBAR FUSION     lymph node removal Right 2018   groin    PERIPHERAL VASCULAR THROMBECTOMY Left 11/08/2023   Procedure: PERIPHERAL VASCULAR THROMBECTOMY;  Surgeon: Marea Selinda RAMAN, MD;  Location: [facility] INVASIVE CV LAB;  Service: Cardiovascular;  Laterality: Left;   POLYPECTOMY     TOTAL ABDOMINAL HYSTERECTOMY W/ BILATERAL SALPINGOOPHORECTOMY     TUBAL LIGATION      reports that she has never smoked. She has never been exposed to tobacco smoke. She has never used smokeless tobacco. She reports that she does not currently use alcohol. She reports that she does not use drugs. Social History   Socioeconomic History   Marital status: Widowed    Spouse name: Not on file   Number of children: 1   Years of education: 15   Highest education level: High school graduate  Occupational History   Occupation: Retired  Tobacco Use    Smoking status: Never    Passive exposure: Never   Smokeless tobacco: Never  Vaping Use   Vaping status: Never Used  Substance and Sexual Activity   Alcohol use: Not Currently   Drug use: Never   Sexual activity: Not Currently  Other Topics Concern   Not on file  Social History Narrative   Daughter lives in [place].  Patient lives [address] in [place].    Social Drivers of Corporate Investment Banker Strain: Low Risk  (10/24/2023)   Received from [facility]   Overall Financial Resource Strain (CARDIA)    Difficulty of Paying Living Expenses: Not hard at all  Food Insecurity: No Food Insecurity (11/06/2023)   Hunger Vital Sign    Worried About Running Out of Food in the Last Year: Never true    Ran  Out of Food in the Last Year: Never true  Transportation Needs: No Transportation Needs (11/07/2023)   PRAPARE - Administrator, Civil Service (Medical): No    Lack of Transportation (Non-Medical): No  Physical Activity: Insufficiently Active (09/29/2022)   Exercise Vital Sign    Days of Exercise per Week: 3 days    Minutes of Exercise per Session: 40 min  Stress: No Stress Concern Present (09/29/2022)   [employer] of Occupational Health - Occupational Stress Questionnaire    Feeling of Stress : Not at all  Social Connections: Moderately Integrated (11/07/2023)   Social Connection and Isolation Panel    Frequency of Communication with Friends and Family: More than three times a week    Frequency of Social Gatherings with Friends and Family: More than three times a week    Attends Religious Services: More than 4 times per year    Active Member of [employer] or Organizations: Yes    Attends Banker Meetings: Never    Marital Status: Widowed  Recent Concern: Social Connections - Moderately Isolated (09/05/2023)   Social Connection and Isolation Panel    Frequency of Communication with Friends and Family: More  than three times a week    Frequency of Social Gatherings with Friends and Family: More than three times a week    Attends Religious Services: More than 4 times per year    Active Member of [employer] or Organizations: No    Attends Banker Meetings: Never    Marital Status: Widowed  Intimate Partner Violence: Not At Risk (11/09/2023)   Humiliation, Afraid, Rape, and Kick questionnaire    Fear of Current or Ex-Partner: No    Emotionally Abused: No    Physically Abused: No    Sexually Abused: No     Functional Status Survey:     Family History  Problem Relation Age of Onset   Depression Mother    Anxiety disorder Mother    Bipolar disorder Mother    Diabetes Mother    Mental illness Mother    Heart disease Mother    Stroke Mother    Lung cancer Father    Diabetes Maternal Grandmother    Heart attack Maternal Grandmother    CVA Paternal Grandmother    Colon cancer Neg Hx    Liver cancer Neg Hx    Pancreatic cancer Neg Hx    Esophageal cancer Neg Hx    Stomach cancer Neg Hx    Rectal cancer Neg Hx    Colon polyps Neg Hx    Breast cancer Neg Hx      Health Maintenance  Topic Date Due   OPHTHALMOLOGY EXAM  04/19/2021   COVID-19 Vaccine (5 - 2025-26 season) 09/20/2023   Medicare Annual Wellness (AWV)  09/29/2023   Colonoscopy  03/19/2024   Zoster Vaccines- Shingrix  (1 of 2) 01/09/2024 (Originally 07/30/1969)   DTaP/Tdap/Td (3 - Td or Tdap) 04/14/2024 (Originally 02/17/2023)   HEMOGLOBIN A1C  03/02/2024   FOOT EXAM  09/13/2024   Mammogram  10/11/2024   Diabetic kidney evaluation - Urine ACR  10/12/2024   Diabetic kidney evaluation - eGFR measurement  11/24/2024   DEXA SCAN  10/03/2026   Pneumococcal Vaccine: 50+ Years  Completed   Influenza Vaccine  Completed   Hepatitis C Screening  Completed   Meningococcal B Vaccine  Aged Out     Allergies  Allergen Reactions   Clindamycin/Lincomycin Anaphylaxis   Doxycycline  Rash   Aspirin  Other (  See Comments)    Pt  is unsure of reaction, but states that she is allergic to aspirin    Ciprofloxacin Rash    very bad rash   Penicillins Rash    Has patient had a PCN reaction causing immediate rash, facial/tongue/throat swelling, SOB or lightheadedness with hypotension: No Has patient had a PCN reaction causing severe rash involving mucus membranes or skin necrosis: No Has patient had a PCN reaction that required hospitalization: No Has patient had a PCN reaction occurring within the last 10 years: No If all of the above answers are NO, then may proceed with Cephalosporin use.      Outpatient Encounter Medications as of 12/02/2023  Medication Sig   acetaminophen  (TYLENOL ) 325 MG tablet Take 650 mg by mouth every 4 (four) hours as needed.   albuterol  (PROVENTIL ) (2.5 MG/3ML) 0.083% nebulizer solution Inhale 3 mLs (2.5 mg total) into the lungs every 4 (four) hours as needed for wheezing or shortness of breath.   amitriptyline  (ELAVIL ) 50 MG tablet Take 1 tablet (50 mg total) by mouth at bedtime.   amLODipine  (NORVASC ) 2.5 MG tablet Take 1 tablet (2.5 mg total) by mouth daily.   apixaban (ELIQUIS) 5 MG TABS tablet Take 1 tablet (5 mg total) by mouth 2 (two) times daily.   Cholecalciferol (VITAMIN D ) 50 MCG (2000 UT) CAPS Take 1 capsule by mouth at bedtime.   Cyanocobalamin (VITAMIN B-12 PO) Take 1 tablet by mouth at bedtime.   EPINEPHrine  (EPIPEN  2-PAK) 0.3 mg/0.3 mL IJ SOAJ injection Inject 0.3 mg into the muscle as needed for anaphylaxis.   FIBER ADULT GUMMIES PO Take 1 tablet by mouth daily as needed.   furosemide  (LASIX ) 40 MG tablet Take 1 tablet (40 mg total) by mouth daily.   hydrocortisone  (CORTEF ) 20 MG tablet Take 1 tablet (20 mg total) by mouth 2 (two) times daily.   hydroxypropyl methylcellulose / hypromellose (ISOPTO TEARS / GONIOVISC) 2.5 % ophthalmic solution Place 2 drops into both eyes 3 (three) times daily.   insulin  glargine (LANTUS) 100 UNIT/ML Solostar Pen Inject 8 Units into the skin  daily.   losartan  (COZAAR ) 50 MG tablet Take 1 tablet (50 mg total) by mouth daily.   Magnesium -Potassium 40-40 MG CAPS Take 40 mEq by mouth once. Give 40 mEq by mouth one time a day for supplement for 2 days.   meclizine  (ANTIVERT ) 25 MG tablet Take 1 tablet (25 mg total) by mouth 3 (three) times daily as needed for dizziness.   pantoprazole  (PROTONIX ) 40 MG tablet Take 1 tablet (40 mg total) by mouth daily.   pregabalin  (LYRICA ) 150 MG capsule Take 1 capsule (150 mg total) by mouth 2 (two) times daily AND 2 capsules (300 mg total) at bedtime.   rosuvastatin  (CRESTOR ) 10 MG tablet Take 1 tablet (10 mg total) by mouth daily.   senna-docusate (SENOKOT-S) 8.6-50 MG tablet Take 2 tablets by mouth 2 (two) times daily.   Suvorexant  (BELSOMRA ) 15 MG TABS Take 1 tablet (15 mg total) by mouth at bedtime as needed.   Blood Glucose Monitoring Suppl (ONETOUCH VERIO) w/Device KIT Use to check blood sugar 3 times a day and document results, bring to appointments.  Goal is <130 fasting blood sugar and <180 two hours after meals. (Patient not taking: Reported on 12/02/2023)   OneTouch UltraSoft 2 Lancets MISC USE TO CHECK BLOOD SUGAR 3 TIMES A DAY AND DOCUMENT RESULTS, BRING TO APPOINTMENTS. GOAL IS <130 FASTING BLOOD SUGAR AND <180 TWO HOURS AFTER MEALS. (Patient  not taking: Reported on 12/02/2023)   No facility-administered encounter medications on file as of 12/02/2023.     Review of Systems  Constitutional: Negative.   HENT:  Negative for ear discharge and ear pain.        Swelling, erythema of the left upper eyelid.  Mucoid drainage of the left inferior lacrimal duct.  Recent fever blisters healing on upper and lower lips. Was present when she was admitted to [facility]. Developed [place] [facility] from [facility])  Eyes:  Positive for discharge and redness.       Blurred vision left eye  Cardiovascular:  Positive for leg swelling.       Chronic swelling in  right LE. Not wearing compression stocks as present time  Gastrointestinal: Negative.   Endocrine: Negative.   Genitourinary: Negative.   Musculoskeletal: Negative.   Skin:        Dry skin, peeling skin on left leg  Allergic/Immunologic: Negative.   Neurological: Negative.   Hematological: Negative.   Psychiatric/Behavioral: Negative.    All other systems reviewed and are negative.    Vitals:   12/02/23 0830  BP: (!) 180/88  Pulse: 72  Temp: (!) 97 F (36.1 C)  SpO2: 96%  Weight: 239 lb 12.8 oz (108.8 kg)  Height: 5' 4 (1.626 m)   Body mass index is 41.16 kg/m. Physical Exam Vitals and nursing note reviewed.  Constitutional:      Appearance: She is obese.  HENT:     Head: Normocephalic and atraumatic.     Mouth/Throat:     Comments: Healing cold sore on upper lip and lower lips. Non-vesicular Eyes:     Comments: Left upper eyelid edema and erythema. No proptosis of left eye. Left eye globe not firm with palpation.  +mucoid/purulent drainage from left inferior lacrimal duct.  Mild left scleral edema  See pictures  Cardiovascular:     Rate and Rhythm: Normal rate and regular rhythm.  Pulmonary:     Effort: Pulmonary effort is normal. No respiratory distress.     Breath sounds: Normal breath sounds. No wheezing.  Abdominal:     General: Bowel sounds are normal. There is no distension.     Palpations: Abdomen is soft.     Tenderness: There is no abdominal tenderness. There is no guarding or rebound.  Musculoskeletal:     Right lower leg: Edema present.     Left lower leg: Edema present.  Skin:    General: Skin is warm and dry.     Capillary Refill: Capillary refill takes less than 2 seconds.  Neurological:     Mental Status: She is alert and oriented to person, place, and time.                   Labs reviewed: Basic Metabolic Panel: Recent Labs    08/31/23 0514 09/01/23 0350 09/02/23 0316 09/04/23 1755 09/14/23 1209 09/15/23 0600  11/08/23 0527 11/15/23 1450 11/25/23 1357  NA 135 137 141   < > 143   < > 137 140 140  K 4.5 4.3 4.2   < > 3.7   < > 3.8 3.4* 3.7  CL 105 104 105   < > 98   < > 100 98 99  CO2 20* 24 26   < > 30   < > 26 27 26   GLUCOSE 244* 161* 143*   < > 155*   < > 300* 181*  221*  BUN 30* 27* 28*   < > 20   < > 24* 15 16  CREATININE 1.65* 1.18* 1.00   < > 1.77*   < > 0.79 0.83 0.97  CALCIUM  8.0* 8.2* 8.3*   < > 9.1   < > 8.5* 8.8 8.8  MG 1.7 2.1 2.2  --  1.6*  --  1.9  --   --   PHOS 3.1 2.5 2.3*  --   --   --   --   --   --    < > = values in this interval not displayed.   Liver Function Tests: Recent Labs    10/13/23 0905 11/08/23 0527 11/15/23 1450 11/25/23 1357  AST 5 13* 8 6*  ALT 9 12 16 10   ALKPHOS 76 49 63  --   BILITOT 0.3 0.3 0.3 0.6  PROT 5.5* 5.9* 5.6* 5.8*  ALBUMIN 3.8 2.6* 3.7*  --    Recent Labs    08/30/23 1506 09/04/23 1755  LIPASE 22 35    CBC: Recent Labs    09/07/23 0530 09/14/23 1045 09/23/23 0000 11/05/23 1514 11/10/23 0519 11/15/23 1502 11/25/23 1357  WBC 11.4*   < > 8.1   < > 10.2 10.4 15.5*  NEUTROABS 8.5*  --  3,451.00  --   --   --  13,919*  HGB 9.6*   < > 12.2   < > 9.0* 10.5* 9.8*  HCT 29.7*   < > 38   < > 28.1* 30.0* 30.0*  MCV 88.1   < >  --    < > 88.4 86 86.2  PLT 184   < > 219   < > 240 218 343   < > = values in this interval not displayed.   BNP: Invalid input(s): POCBNP Lab Results  Component Value Date   HGBA1C 6.9 (H) 08/31/2023   Lab Results  Component Value Date   TSH 0.736 09/04/2023   Lab Results  Component Value Date   VITAMINB12 1,867 (H) 04/15/2023   Lab Results  Component Value Date   FOLATE 12.1 07/19/2012   Lab Results  Component Value Date   IRON 51 11/15/2023   FERRITIN 63 11/15/2023     Imaging and Procedures obtained prior to SNF admission: CT Angio Chest PE W/Cm &/Or Wo Cm Result Date: 11/05/2023 EXAM: CTA CHEST 11/05/2023 06:04:06 PM TECHNIQUE: CTA of the chest was performed with and without the  administration of 50 mL of intravenous contrast. Multiplanar reformatted images are provided for review. MIP images are provided for review. Automated exposure control, iterative reconstruction, and/or weight based adjustment of the mA/kV was utilized to reduce the radiation dose to as low as reasonably achievable. COMPARISON: Chest x-ray and CT 09/04/2023. CLINICAL HISTORY: Pulmonary embolism (PE) suspected, low to intermediate prob, positive D-dimer. Pt comes with sob since Tuesday and sever swelling in left leg. Pt states wheeping from leg too. Pt states more exertion when walking. FINDINGS: PULMONARY ARTERIES: Pulmonary arteries are adequately opacified for evaluation. No acute pulmonary embolus. Main pulmonary artery is normal in caliber. MEDIASTINUM: Coronary artery and aortic atherosclerotic calcification. The heart and pericardium demonstrate no acute abnormality. There is no acute abnormality of the thoracic aorta. LYMPH NODES: No mediastinal, hilar or axillary lymphadenopathy. LUNGS AND PLEURA: The lungs are without acute process. No focal consolidation or pulmonary edema. No evidence of pleural effusion or pneumothorax. UPPER ABDOMEN: Limited images of the upper abdomen demonstrate hepatic steatosis. Otherwise, the visualized portions  are unremarkable. SOFT TISSUES AND BONES: No acute bone or soft tissue abnormality. IMPRESSION: 1. No pulmonary embolism. Electronically signed by: Norman Gatlin MD 11/05/2023 06:39 PM EDT RP Workstation: [id number]   US  Venous Img Lower Unilateral Left Result Date: 11/05/2023 CLINICAL DATA:  Left lower extremity edema for 4 days EXAM: LEFT LOWER EXTREMITY VENOUS DOPPLER ULTRASOUND TECHNIQUE: Gray-scale sonography with graded compression, as well as color Doppler and duplex ultrasound were performed to evaluate the lower extremity deep venous systems from the level of the common femoral vein and including the common femoral, femoral, profunda femoral, popliteal and calf  veins including the posterior tibial, peroneal and gastrocnemius veins when visible. The superficial great saphenous vein was also interrogated. Spectral Doppler was utilized to evaluate flow at rest and with distal augmentation maneuvers in the common femoral, femoral and popliteal veins. COMPARISON:  None Available. FINDINGS: Contralateral Common Femoral Vein: Respiratory phasicity is normal and symmetric with the symptomatic side. No evidence of thrombus. Normal compressibility. Common Femoral Vein: Occlusive thrombus. The vessel is noncompressible with no Doppler flow detected. Saphenofemoral Junction: Occlusive thrombus with no Doppler flow detected. Profunda Femoral Vein: Occlusive thrombus. The vessel is noncompressible with no Doppler flow detected. Femoral Vein: Occlusive thrombus. The vessel is noncompressible with no Doppler flow detected. Popliteal Vein: Occlusive thrombus, the vessel is noncompressible with no Doppler flow detected. Calf Veins: No evidence of thrombus, though evaluation is limited due to subcutaneous edema. Superficial Great Saphenous Vein: Near occlusive thrombus is identified, with only a small segment of the saphenous vein demonstrating Doppler flow. Other Findings:  None. IMPRESSION: 1. Extensive acute appearing deep venous thrombosis throughout the entirety of the left lower extremity as above. Critical Value/emergent results were called by telephone at the time of interpretation on 11/05/2023 at 5:55 pm to provider LORELLE CHEADLE, who verbally acknowledged these results. Electronically Signed   By: Ozell Daring M.D.   On: 11/05/2023 17:59   DG Chest 2 View Result Date: 11/05/2023 CLINICAL DATA:  Shortness of breath. EXAM: CHEST - 2 VIEW COMPARISON:  09/14/2023. FINDINGS: Low lung volume. Bilateral lung fields are clear. Bilateral costophrenic angles are clear. Stable cardio-mediastinal silhouette. No acute osseous abnormalities. The soft tissues are within normal limits.  IMPRESSION: No active cardiopulmonary disease. Electronically Signed   By: Ree Molt M.D.   On: 11/05/2023 15:04     Assessment/Plan Assessment & Plan Swelling of left eye Unable to properly evaluate her eye in SNF setting.  While the left globe of the eye does not feel hard on palpation, I think she needs to have her intraocular pressures measured with a tonometer.  She also likely needs a CT of her face to rule out pre versus post septal cellulitis.  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  She does not have any other vesicular lesions that I can see.  She does have a severe clindamycin allergy as she had bright red rash with denuding of the skin and an anaphylaxis reaction requiring ICU admission in August 2025.  I think I have to send her to the ER for an expedited evaluation.  Patient has been informed and agrees.  Will coordinate with the nurse on the floor to get her to the ER today.     Chronic heart failure with preserved ejection fraction (HFpEF) [facility]) Patient had a mildly elevated BNP when she was admitted to [facility] again.  She was diuresed.  She has stable  edema of her lower extremities.     Adrenal insufficiency (Addison's disease) (HCC) Patient remains on hydrocortisone  20 mg twice daily.     Morbid obesity with BMI of 40.0-44.9, adult (HCC) Body mass index is 41.16 kg/m.      Type 2 diabetes mellitus in patient with obesity [facility]) Patient states that she wants to learn how to give her own Lantus shots.  Patient does live in independent living.  Will have the SNF nurse teach her how to give herself Lantus pen injections.     History of deep vein thrombosis (DVT) of lower extremity Patient had an extensive DVT of her left leg requiring mechanical thrombectomy during her October 2025 admission. She remains on Eliquis 5 mg twice daily.     Debility Continue with physical therapy and efforts to  get the patient back to independent living status.  Patient is a resident at [facility].          Family/ staff Communication:  discussed with SNF LPN that pt needs to be sent to ER for evaluation of her left eye swelling/erythema. Attempted to call pt's dtr kim willard [phone]. Received message due to network difficulties your call cannot be completed at this time. Tried from both my cellphone and with [employer].   Labs/tests ordered:    Camellia Door, DO [facility] [phone]

## 2023-12-05 NOTE — Patient Instructions (Signed)

## 2023-12-06 ENCOUNTER — Encounter: Payer: Self-pay | Admitting: Orthopedic Surgery

## 2023-12-06 ENCOUNTER — Non-Acute Institutional Stay (SKILLED_NURSING_FACILITY): Admitting: Orthopedic Surgery

## 2023-12-06 DIAGNOSIS — L03213 Periorbital cellulitis: Secondary | ICD-10-CM | POA: Diagnosis not present

## 2023-12-06 DIAGNOSIS — E271 Primary adrenocortical insufficiency: Secondary | ICD-10-CM

## 2023-12-06 DIAGNOSIS — R197 Diarrhea, unspecified: Secondary | ICD-10-CM

## 2023-12-06 DIAGNOSIS — I82412 Acute embolism and thrombosis of left femoral vein: Secondary | ICD-10-CM | POA: Diagnosis not present

## 2023-12-06 DIAGNOSIS — R531 Weakness: Secondary | ICD-10-CM

## 2023-12-06 DIAGNOSIS — I5032 Chronic diastolic (congestive) heart failure: Secondary | ICD-10-CM

## 2023-12-06 DIAGNOSIS — E876 Hypokalemia: Secondary | ICD-10-CM

## 2023-12-06 NOTE — Progress Notes (Signed)
 Location:  Other [facility]) Nursing Home Room Number: 117- A Place of Service:  SNF (31)  Provider: Greig CHARLENA Cluster, NP    PCP: Valerio Melanie DASEN, NP Patient Care Team: Valerio Melanie DASEN, NP as PCP - General (Nurse Practitioner) [facility] (Optometry) Danis, Victory LITTIE MOULD, MD as Consulting Physician (Gastroenterology) Phebe Janina Pates, MD as Referring Physician (Endocrinology) Simers, Saralyn Caldron, NP as Nurse Practitioner (Neurology) [name], NP as Nurse Practitioner (Behavioral Health) [name], MD (Dermatology)  Extended Emergency Contact Information Primary Emergency Contact: Ferdie Iha  [place] Home Phone: [phone] Relation: Daughter Secondary Emergency Contact: Willard,Jay Mobile Phone: [phone] Relation: Other  Code Status: Full Code  Goals of care:  Advanced Directive information    12/02/2023   10:29 AM  Advanced Directives  Does Patient Have a Medical Advance Directive? Yes  Type of Advance Directive Living will  Does patient want to make changes to medical advance directive? No - Patient declined  Would patient like information on creating a medical advance directive? No - Patient declined     Allergies  Allergen Reactions   Clindamycin/Lincomycin Anaphylaxis   Doxycycline  Rash   Aspirin  Other (See Comments)    Pt is unsure of reaction, but states that she is allergic to aspirin    Ciprofloxacin Rash    very bad rash   Penicillins Rash    Has patient had a PCN reaction causing immediate rash, facial/tongue/throat swelling, SOB or lightheadedness with hypotension: No Has patient had a PCN reaction causing severe rash involving mucus membranes or skin necrosis: No Has patient had a PCN reaction that required hospitalization: No Has patient had a PCN reaction occurring within the last 10 years: No If all of the above answers are NO, then may proceed with Cephalosporin use.      Chief Complaint  Patient presents with   Discharge Note    Discharge from [facility]     HPI:  73 y.o. female seen today for discharge evaluation.   She currently resides on the rehab unit at [facility]. PMH: HFpEF, DVT, HTN, Addison's disease, HLD, T2DM, osteopenia, anemia, B12 deficiency, obesity, malignant melanoma and depression.   11/05 developed severe rash resembling sunburn. She also had shortness of breath, poor appetite and ongoing diarrhea. She was not taking medications for a few days prior to symptoms. 11/06 she was readmitted to rehab due to weakness. Today, appetite has improved. Sunburn rash has started to fade. BNP was noted to be 155. CXR was negative for infiltrate or effusion. WBC was noted to be 15.5. Hgb 9.8, hct 30.0, glucose 221, BUN/creat 16/0.97, Na+ 140, K+ 3.7, calcium  8.8, protein 5.8, AST/ALT 6/10. She was given extra furosemide  due to elevated BNP. Cortisol pending. Nursing called [facility] and was unable to get f/u appointment. Diarrhea ongoing. Denies blood in stool, N/V or abdominal pain. She reports having intermittent diarrhea > 2 weeks. She was recently hospitalized due to DVT.    11/06 she was readmitted to rehab, see above. 11/13 she had left eye swelling and redness. ED evaluation recommended by Dr. Laurence. CT orbit noted uncomplicated preseptal cellulitis. She was given 1 dose of IV Rocephin . She was discharged with Keflex  and Bactrim.   Today she plans to discharge home. Sunburn rash has subsided. Diarrhea has improved. She denies pain or change in vision to left eyes. She is ambulating moderate distances with rolator. HH PT/OT will visit her at home. We discussed importance  of taking medication daily and eating. PCP follow up the week after thanksgiving. Afebrile. Vitals stable.   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)    PMS (premenstrual syndrome)     Past Surgical History:  Procedure Laterality Date   BREAST BIOPSY Right    benign   CHOLECYSTECTOMY     COLONOSCOPY  11/08/2018   Danis-polyps   COLONOSCOPY  12/05/2019   Danis   DILATION AND CURETTAGE OF UTERUS     FOOT SURGERY Right 2018   removal of melanoma   LUMBAR FUSION     lymph node removal Right 2018   groin    PERIPHERAL VASCULAR THROMBECTOMY Left 11/08/2023   Procedure: PERIPHERAL VASCULAR THROMBECTOMY;  Surgeon: Marea Selinda RAMAN, MD;  Location: [facility] INVASIVE CV LAB;  Service: Cardiovascular;  Laterality: Left;   POLYPECTOMY     TOTAL ABDOMINAL HYSTERECTOMY W/ BILATERAL SALPINGOOPHORECTOMY     TUBAL LIGATION        reports that she has never smoked. She has never been exposed to tobacco smoke. She has never used smokeless tobacco. She reports that she does not currently use alcohol. She reports that she does not use drugs. Social History   Socioeconomic History   Marital status: Widowed    Spouse name: Not on file   Number of children: 1   Years of education: 15   Highest education level: High school graduate  Occupational History   Occupation: Retired  Tobacco Use   Smoking status: Never    Passive exposure: Never   Smokeless tobacco: Never  Vaping Use   Vaping status: Never Used  Substance and Sexual Activity   Alcohol use: Not Currently   Drug use: Never   Sexual activity: Not Currently  Other Topics Concern   Not on file  Social History Narrative   Daughter lives in  [place].  Patient lives [address] in [place].    Social Drivers of Corporate Investment Banker Strain: Low Risk  (10/24/2023)   Received from [facility]   Overall Financial Resource Strain (CARDIA)    Difficulty of Paying Living Expenses: Not hard at all  Food Insecurity: No Food Insecurity (11/06/2023)   Hunger Vital Sign    Worried About Running Out of Food in the Last Year: Never true    Ran Out of Food in the Last Year: Never true  Transportation Needs: No Transportation Needs (11/07/2023)   PRAPARE - Administrator, Civil Service (Medical): No    Lack of Transportation (Non-Medical): No  Physical Activity: Insufficiently Active (09/29/2022)   Exercise Vital Sign    Days of Exercise per Week: 3 days    Minutes of Exercise per Session: 40 min  Stress: No Stress  Concern Present (09/29/2022)   [employer] of Occupational Health - Occupational Stress Questionnaire    Feeling of Stress : Not at all  Social Connections: Moderately Integrated (11/07/2023)   Social Connection and Isolation Panel    Frequency of Communication with Friends and Family: More than three times a week    Frequency of Social Gatherings with Friends and Family: More than three times a week    Attends Religious Services: More than 4 times per year    Active Member of [employer] or Organizations: Yes    Attends Banker Meetings: Never    Marital Status: Widowed  Recent Concern: Social Connections - Moderately Isolated (09/05/2023)   Social Connection and Isolation Panel    Frequency of Communication with Friends and Family: More than three times a week    Frequency of Social Gatherings with Friends and Family: More than three times a week    Attends Religious Services: More than 4 times per year    Active Member of [employer] or Organizations: No    Attends Banker Meetings: Never    Marital Status: Widowed  Intimate Partner Violence:  Not At Risk (11/09/2023)   Humiliation, Afraid, Rape, and Kick questionnaire    Fear of Current or Ex-Partner: No    Emotionally Abused: No    Physically Abused: No    Sexually Abused: No   Functional Status Survey:    Allergies  Allergen Reactions   Clindamycin/Lincomycin Anaphylaxis   Doxycycline  Rash   Aspirin  Other (See Comments)    Pt is unsure of reaction, but states that she is allergic to aspirin    Ciprofloxacin Rash    very bad rash   Penicillins Rash    Has patient had a PCN reaction causing immediate rash, facial/tongue/throat swelling, SOB or lightheadedness with hypotension: No Has patient had a PCN reaction causing severe rash involving mucus membranes or skin necrosis: No Has patient had a PCN reaction that required hospitalization: No Has patient had a PCN reaction occurring within the last 10 years: No If all of the above answers are NO, then may proceed with Cephalosporin use.     Pertinent  Health Maintenance Due  Topic Date Due   OPHTHALMOLOGY EXAM  04/19/2021   Colonoscopy  03/19/2024   HEMOGLOBIN A1C  03/02/2024   FOOT EXAM  09/13/2024   Mammogram  10/11/2024   DEXA SCAN  10/03/2026   Influenza Vaccine  Completed    Medications: Outpatient Encounter Medications as of 12/06/2023  Medication Sig   acetaminophen  (TYLENOL ) 325 MG tablet Take 650 mg by mouth every 4 (four) hours as needed.   albuterol  (PROVENTIL ) (2.5 MG/3ML) 0.083% nebulizer solution Inhale 3 mLs (2.5 mg total) into the lungs every 4 (four) hours as needed for wheezing or shortness of breath.   amitriptyline  (ELAVIL ) 50 MG tablet Take 1 tablet (50 mg total) by mouth at bedtime.   amLODipine  (NORVASC ) 2.5 MG tablet Take 1 tablet (2.5 mg total) by mouth daily.   apixaban (ELIQUIS) 5 MG TABS tablet Take 1 tablet (5 mg total) by mouth 2 (two) times daily.   cephALEXin  (KEFLEX ) 500 MG capsule Take 1 capsule (500 mg total) by mouth 4 (four) times daily for 7 days.   Cholecalciferol  (VITAMIN D ) 50 MCG (2000 UT) CAPS Take 1 capsule by mouth at bedtime.   Cyanocobalamin (VITAMIN B-12 PO) Take 1 tablet by mouth at bedtime.   Docosanol (ABREVA) 10 % CREA Apply 1 Application topically 5 (five) times  daily. Apply to lips for cold sores   EPINEPHrine  (EPIPEN  2-PAK) 0.3 mg/0.3 mL IJ SOAJ injection Inject 0.3 mg into the muscle as needed for anaphylaxis.   FIBER ADULT GUMMIES PO Take 1 tablet by mouth daily as needed.   furosemide  (LASIX ) 40 MG tablet Take 1 tablet (40 mg total) by mouth daily.   hydrocortisone  (CORTEF ) 20 MG tablet Take 1 tablet (20 mg total) by mouth 2 (two) times daily.   hydroxypropyl methylcellulose / hypromellose (ISOPTO TEARS / GONIOVISC) 2.5 % ophthalmic solution Place 2 drops into both eyes 3 (three) times daily.   insulin  glargine (LANTUS) 100 UNIT/ML Solostar Pen Inject 8 Units into the skin daily.   losartan  (COZAAR ) 50 MG tablet Take 1 tablet (50 mg total) by mouth daily.   meclizine  (ANTIVERT ) 25 MG tablet Take 1 tablet (25 mg total) by mouth 3 (three) times daily as needed for dizziness.   pantoprazole  (PROTONIX ) 40 MG tablet Take 1 tablet (40 mg total) by mouth daily.   pregabalin  (LYRICA ) 150 MG capsule Take 150 mg by mouth daily. For neuropathy pain see other listing   pregabalin  (LYRICA ) 150 MG capsule Take 300 mg by mouth at bedtime.   rosuvastatin  (CRESTOR ) 10 MG tablet Take 1 tablet (10 mg total) by mouth daily.   senna-docusate (SENOKOT-S) 8.6-50 MG tablet Take 2 tablets by mouth 2 (two) times daily.   sulfamethoxazole-trimethoprim (BACTRIM DS) 800-160 MG tablet Take 1 tablet by mouth 2 (two) times daily for 7 days.   Suvorexant  (BELSOMRA ) 15 MG TABS Take 1 tablet (15 mg total) by mouth at bedtime as needed.   Blood Glucose Monitoring Suppl (ONETOUCH VERIO) w/Device KIT Use to check blood sugar 3 times a day and document results, bring to appointments.  Goal is <130 fasting blood sugar and <180 two hours after meals. (Patient not taking: Reported  on 12/06/2023)   Magnesium -Potassium 40-40 MG CAPS Take 40 mEq by mouth once. Give 40 mEq by mouth one time a day for supplement for 2 days. (Patient not taking: Reported on 12/06/2023)   OneTouch UltraSoft 2 Lancets MISC USE TO CHECK BLOOD SUGAR 3 TIMES A DAY AND DOCUMENT RESULTS, BRING TO APPOINTMENTS. GOAL IS <130 FASTING BLOOD SUGAR AND <180 TWO HOURS AFTER MEALS. (Patient not taking: Reported on 12/06/2023)   pregabalin  (LYRICA ) 150 MG capsule Take 1 capsule (150 mg total) by mouth 2 (two) times daily AND 2 capsules (300 mg total) at bedtime. (Patient not taking: No sig reported)   No facility-administered encounter medications on file as of 12/06/2023.    Review of Systems  Constitutional:  Negative for fatigue and fever.  HENT:  Negative for sore throat and trouble swallowing.   Eyes:  Negative for pain, discharge and visual disturbance.  Respiratory:  Negative for cough and shortness of breath.   Gastrointestinal:  Positive for diarrhea. Negative for abdominal distention, abdominal pain, constipation, nausea and vomiting.  Genitourinary:  Negative for dysuria, frequency and hematuria.  Musculoskeletal:  Positive for gait problem.  Skin:  Negative for color change.  Neurological:  Positive for weakness. Negative for dizziness and headaches.  Psychiatric/Behavioral:  Negative for confusion, dysphoric mood and sleep disturbance. The patient is not nervous/anxious.     Vitals:   12/06/23 0906 12/06/23 0910  BP: (S) (!) 154/80 (!) 140/71  Pulse: 71   Temp: (!) 97.5 F (36.4 C)   SpO2: 96%   Weight: 236 lb (107 kg)   Height: 5' 4 (1.626 m)    Body  mass index is 40.51 kg/m. Physical Exam Vitals reviewed.  Constitutional:      General: She is not in acute distress. HENT:     Head: Normocephalic.  Eyes:     General:        Right eye: No discharge.        Left eye: No discharge.  Cardiovascular:     Rate and Rhythm: Normal rate and regular rhythm.     Pulses: Normal  pulses.     Heart sounds: Normal heart sounds.  Pulmonary:     Effort: Pulmonary effort is normal.     Breath sounds: Normal breath sounds.  Abdominal:     General: Bowel sounds are normal. There is no distension.     Palpations: Abdomen is soft.     Tenderness: There is no abdominal tenderness.  Musculoskeletal:     Cervical back: Neck supple.     Right lower leg: Edema present.     Left lower leg: Edema present.     Comments: BLE non pitting  Skin:    General: Skin is warm.     Capillary Refill: Capillary refill takes less than 2 seconds.  Neurological:     General: No focal deficit present.     Mental Status: She is alert. Mental status is at baseline.     Gait: Gait abnormal.  Psychiatric:        Mood and Affect: Mood normal.     Labs reviewed: Basic Metabolic Panel: Recent Labs    08/31/23 0514 09/01/23 0350 09/02/23 0316 09/04/23 1755 09/14/23 1209 09/15/23 0600 11/08/23 0527 11/15/23 1450 11/25/23 1357 11/29/23 0000 12/02/23 1131  NA 135 137 141   < > 143   < > 137 140 140 142 143  K 4.5 4.3 4.2   < > 3.7   < > 3.8 3.4* 3.7 3.1* 3.3*  CL 105 104 105   < > 98   < > 100 98 99 97* 99  CO2 20* 24 26   < > 30   < > 26 27 26  34* 31  GLUCOSE 244* 161* 143*   < > 155*   < > 300* 181* 221*  --  254*  BUN 30* 27* 28*   < > 20   < > 24* 15 16 18 14   CREATININE 1.65* 1.18* 1.00   < > 1.77*   < > 0.79 0.83 0.97 1.0 0.86  CALCIUM  8.0* 8.2* 8.3*   < > 9.1   < > 8.5* 8.8 8.8 8.1* 8.8*  MG 1.7 2.1 2.2  --  1.6*  --  1.9  --   --   --   --   PHOS 3.1 2.5 2.3*  --   --   --   --   --   --   --   --    < > = values in this interval not displayed.   Liver Function Tests: Recent Labs    10/13/23 0905 11/08/23 0527 11/15/23 1450 11/25/23 1357  AST 5 13* 8 6*  ALT 9 12 16 10   ALKPHOS 76 49 63  --   BILITOT 0.3 0.3 0.3 0.6  PROT 5.5* 5.9* 5.6* 5.8*  ALBUMIN 3.8 2.6* 3.7*  --    Recent Labs    08/30/23 1506 09/04/23 1755  LIPASE 22 35   No results for input(s):  AMMONIA in the last 8760 hours. CBC: Recent Labs    11/15/23 1502 11/25/23 1357 11/29/23 0000  12/02/23 1131  WBC 10.4 15.5* 7.2 14.9*  NEUTROABS  --  13,919* 4,752.00 12.3*  HGB 10.5* 9.8* 10.7* 11.5*  HCT 30.0* 30.0* 33* 36.4  MCV 86 86.2  --  87.3  PLT 218 343 355 384   Cardiac Enzymes: No results for input(s): CKTOTAL, CKMB, CKMBINDEX, TROPONINI in the last 8760 hours. BNP: Invalid input(s): POCBNP CBG: Recent Labs    11/09/23 2146 11/10/23 0809 11/10/23 1131  GLUCAP 155* 170* 205*    Procedures and Imaging Studies During Stay: CT Orbits W Contrast Result Date: 12/02/2023 EXAM: CT ORBITS WITH CONTRAST 12/02/2023 12:47:02 PM TECHNIQUE: CT of the orbits was performed with the administration of 75 mL of iohexol  (OMNIPAQUE ) 300 MG/ML solution. Multiplanar reformatted images are provided for review. Suboptimal intravascular contrast timing but the visible major vascular structures at the skull base appear to be enhancing as expected. Automated exposure control, iterative reconstruction, and/or weight based adjustment of the mA/kV was utilized to reduce the radiation dose to as low as reasonably achievable. COMPARISON: None available. CLINICAL HISTORY: 73 year old female. Periorbital cellulitis. FINDINGS: ORBITS: Globes are intact. Normal extraocular muscles. Normal optic nerve-sheath complexes. Bilateral intraorbital soft tissues appear normal. No hematoma or inflammatory change posterior to the orbital septum. No mass. SOFT TISSUES: Mild asymmetric left orbit preseptal soft tissue thickening and stranding (such as inferior to the orbit on series 2 image 18), No soft tissue gas or fluid collection. Negative visible deep soft tissue spaces of the face. SINUSES AND MASTOIDS: No acute maxillary dental finding identified. Tympanic cavities and mastoids are well aerated. Generally well aerated paranasal sinuses. There is mild mucosal thickening and bubbly opacity in the right  sphenoid sinus. Minimal ethmoid mucosal thickening. These findings do not appear clinically significant. BONES: Intact orbit walls. No acute abnormality. VASCULATURE: ICA siphon calcified atherosclerosis. BRAIN PARENCHYMA: Negative for age visible brain parenchyma. IMPRESSION: 1. Preseptal, periorbital cellulitis with no complicating features. Electronically signed by: Helayne Hurst MD 12/02/2023 12:59 PM EST RP Workstation: [id number]   DG Elbow Complete Right Result Date: 11/19/2023 EXAM: 3 VIEW(S) XRAY OF THE RIGHT ELBOW COMPARISON: None available. CLINICAL HISTORY: Pain. FINDINGS: BONES AND JOINTS: No acute fracture. No focal osseous lesion. No joint dislocation. No joint effusion. SOFT TISSUES: The soft tissues are unremarkable. IMPRESSION: 1. No acute abnormality. Electronically signed by: Morgane Naveau MD 11/19/2023 03:54 AM EDT RP Workstation: [id number]   DG Shoulder Right Result Date: 11/19/2023 EXAM: 1 VIEW XRAY OF THE RIGHT SHOULDER 11/18/2023 08:33:54 AM COMPARISON: None available. CLINICAL HISTORY: pain FINDINGS: BONES AND JOINTS: Glenohumeral joint is normally aligned. No acute fracture or dislocation. [facility] joint is unremarkable in appearance. Mild degenerative changes are present. SOFT TISSUES: No abnormal calcifications. Visualized lung is unremarkable. IMPRESSION: 1. No acute findings. Electronically signed by: Morgane Naveau MD 11/19/2023 03:53 AM EDT RP Workstation: [id number]   PERIPHERAL VASCULAR CATHETERIZATION Result Date: 11/08/2023 See surgical note for result.   Assessment/Plan:   1. Preseptal cellulitis of left eye (Primary) - 11/13 ED evaluation - IV rocephin  given - cont Keflex  and Bactrim  2. Chronic heart failure with preserved ejection fraction (HCC) - BUN/creat 14/0.86 12/02/2023 - non pitting edema, lungs clear, no sob/cough - cont furosemide   3. Adrenal insufficiency (Addison's disease) (HCC) - 11/06 concerns for crisis> sun tan complexion, malaise,  diarrhea - she had not been taking medication consistently - cortisol level 0.56 11/25/2023 - complexion, condition improved in rehab - followed by Dr. Phebe with [facility]> unable to get f/u  4. Acute deep vein thrombosis (DVT) of femoral vein of left lower extremity (HCC)  hospitalized 10/17-10/22 - cont Eliquis> recommended course 6-12 months  5. Diarrhea, unspecified type - improved  6. Generalized weakness - no recent falls - cont HH PT/OT  7. Hypokalemia - resolved  - recent K+ 4.1 12/06/2023    Patient is being discharged with the following home health services:  [facility] PT/OT  Patient is being discharged with the following durable medical equipment: none  Patient has been advised to f/u with their PCP in 1-2 weeks to for a transitions of care visit.  Social services at their facility was responsible for arranging this appointment.  Pt was provided with adequate prescriptions of noncontrolled medications to reach the scheduled appointment .  For controlled substances, a limited supply was provided as appropriate for the individual patient.  If the pt normally receives these medications from a pain clinic or has a contract with another physician, these medications should be received from that clinic or physician only).    Future labs/tests needed: bmp

## 2023-12-07 ENCOUNTER — Encounter (INDEPENDENT_AMBULATORY_CARE_PROVIDER_SITE_OTHER): Admitting: Vascular Surgery

## 2023-12-09 ENCOUNTER — Ambulatory Visit: Payer: Self-pay | Admitting: Nurse Practitioner

## 2023-12-09 ENCOUNTER — Ambulatory Visit: Admitting: Nurse Practitioner

## 2023-12-09 ENCOUNTER — Encounter: Payer: Self-pay | Admitting: Nurse Practitioner

## 2023-12-09 VITALS — BP 130/73 | HR 78 | Temp 98.4°F | Resp 16 | Ht 64.0 in | Wt 235.0 lb

## 2023-12-09 DIAGNOSIS — E669 Obesity, unspecified: Secondary | ICD-10-CM

## 2023-12-09 DIAGNOSIS — H5789 Other specified disorders of eye and adnexa: Secondary | ICD-10-CM

## 2023-12-09 DIAGNOSIS — D649 Anemia, unspecified: Secondary | ICD-10-CM

## 2023-12-09 DIAGNOSIS — E1169 Type 2 diabetes mellitus with other specified complication: Secondary | ICD-10-CM | POA: Diagnosis not present

## 2023-12-09 DIAGNOSIS — I5032 Chronic diastolic (congestive) heart failure: Secondary | ICD-10-CM

## 2023-12-09 DIAGNOSIS — F5105 Insomnia due to other mental disorder: Secondary | ICD-10-CM

## 2023-12-09 DIAGNOSIS — E1159 Type 2 diabetes mellitus with other circulatory complications: Secondary | ICD-10-CM

## 2023-12-09 DIAGNOSIS — E119 Type 2 diabetes mellitus without complications: Secondary | ICD-10-CM

## 2023-12-09 DIAGNOSIS — F3341 Major depressive disorder, recurrent, in partial remission: Secondary | ICD-10-CM

## 2023-12-09 DIAGNOSIS — I152 Hypertension secondary to endocrine disorders: Secondary | ICD-10-CM

## 2023-12-09 DIAGNOSIS — Z6841 Body Mass Index (BMI) 40.0 and over, adult: Secondary | ICD-10-CM

## 2023-12-09 LAB — BAYER DCA HB A1C WAIVED: HB A1C (BAYER DCA - WAIVED): 8.1 % — ABNORMAL HIGH (ref 4.8–5.6)

## 2023-12-09 MED ORDER — OZEMPIC (0.25 OR 0.5 MG/DOSE) 2 MG/3ML ~~LOC~~ SOPN: PEN_INJECTOR | SUBCUTANEOUS | 4 refills | Status: DC

## 2023-12-09 NOTE — Assessment & Plan Note (Signed)
 Ongoing, will collaborate with cardiology.  At this time EF 55 to 60% on 09/05/23.  Baseline edema to ankles present. May need to stop Amlodipine  next visit if ongoing HF diagnosis.  Recommend: - Reminded to call for an overnight weight gain of >2 pounds or a weekly weight gain of >5 pounds - not adding salt to food and read food labels. Reviewed the importance of keeping daily sodium intake to 2000mg  daily.  - Avoid Ibuprofen 

## 2023-12-09 NOTE — Assessment & Plan Note (Signed)
Chronic, ongoing.  Followed by psychiatry who fills Belsomra, continue current medication regimen as prescribed by them. 

## 2023-12-09 NOTE — Assessment & Plan Note (Signed)
 Acute and improved at this time, complete full abx regimen.

## 2023-12-09 NOTE — Assessment & Plan Note (Signed)
 BMI 40.34 with T2DM, HTN, HLD. Recommended eating smaller high protein, low fat meals more frequently and exercising 30 mins a day 5 times a week with a goal of 10-15lb weight loss in the next 3 months. Patient voiced their understanding and motivation to adhere to these recommendations.

## 2023-12-09 NOTE — Assessment & Plan Note (Signed)
Chronic, stable.  Denies SI/HI.  Followed by psychiatry -- recent notes reviewed and will maintain current medication regimen as prescribed by them. 

## 2023-12-09 NOTE — Progress Notes (Signed)
 Sarah [name]                                          MRN: [id number]   12/09/2023   The [facility] Quality Team Specialist reviewed this patient medical record for the purposes of chart review for care gap closure. The following were reviewed: abstraction for care gap closure-kidney health evaluation for diabetes:eGFR  and uACR.    [facility] Quality Team

## 2023-12-09 NOTE — Progress Notes (Signed)
 BP 130/73 (BP Location: Left Arm, Patient Position: Sitting, Cuff Size: Large)   Pulse 78   Temp 98.4 F (36.9 C) (Oral)   Resp 16   Ht 5' 4 (1.626 m)   Wt 235 lb (106.6 kg)   SpO2 98%   BMI 40.34 kg/m    Subjective:    Patient ID: Sarah [name], female    DOB: 1950-09-17, 73 y.o.   MRN: [id number]  HPI: Sarah [name] is a 73 y.o. female  Chief Complaint  Patient presents with   Hospitalization Follow-up    Discharged from [facility] on . Has PT coming into home 2 x weekly and she does her home exercises. Says energy/stregth is taking a bit to return.    DIABETES She is taking Metformin  1000 MG BID, was not on list/added back, and Lantus 8 units. A1c in August was 6.9%.  We discussed option of GLP1 if A1c elevated, which she is interested in. No family history of thyroid  cancer (MTC, MEN 2, thyroid  cell tumors) or pancreatitis.    Hypoglycemic episodes:no Polydipsia/polyuria: mouth stays dry and stays thirsty Visual disturbance: no Chest pain: no Paresthesias: no Glucose Monitoring: yes  Accucheck frequency: BID  Fasting glucose: 130 to 200 range  Post prandial:  Evening:  Before meals: Taking Insulin ?: yes  Long acting insulin : Lantus 8 units  Short acting insulin : Blood Pressure Monitoring: daily Retinal Examination: Up to Date -- [facility] in March 2025 Foot Exam: Up to Date Diabetic Education: Not Completed Pneumovax: Up to Date Influenza: Up to Date Aspirin : no   HYPERTENSION / HYPERLIPIDEMIA Sees cardiology and vascular next week. Taking Amlodipine , Eliquis, Lasix , Losartan , and Rosuvastatin .  EF August 2025 was 55-60% with normal LVF.  Satisfied with current treatment? yes Duration of hypertension: chronic BP monitoring frequency: daily BP range:  BP medication side effects: no Duration of hyperlipidemia: chronic Cholesterol medication side effects: no Cholesterol supplements: none Medication compliance: good compliance Aspirin :  no Recent stressors: no Recurrent headaches: no Visual changes: no Palpitations: no Dyspnea: no Chest pain: no Lower extremity edema: yes Dizzy/lightheaded: no   ER FOLLOW UP Was seen in ER on 12/02/23 for preseptal cellulitis. Treated outpatient with Keflex  and Bactrim. This is improving per patient. Time since discharge: 7 days Hospital/facility: [facility] Diagnosis: Preseptal cellulitis Procedures/tests: CT orbits and CBC + BMP -- glucose elevated (had just ate), K+ a little low, WBC elevated, HGB lower side Consultants: none New medications: as above abx therapy Discharge instructions: Follow-up with PCP Status: better   DEPRESSION & INSOMNIA Sees psychiatry next in December. Mood status: stable Satisfied with current treatment?: yes Symptom severity: moderate  Duration of current treatment : chronic Side effects: no Medication compliance: good compliance Psychotherapy/counseling: yes in the past Depressed mood: no Anxious mood: no Anhedonia: no Significant weight loss or gain: yes Insomnia: yes hard to stay asleep Fatigue: yes Feelings of worthlessness or guilt: no Impaired concentration/indecisiveness: no Suicidal ideations: no Hopelessness: no Crying spells: no    12/09/2023   10:11 AM 11/28/2023    2:13 PM 11/15/2023    2:45 PM 09/14/2023   10:07 AM 04/15/2023    9:30 AM  Depression screen PHQ 2/9  Decreased Interest 1 1 1 2 1   Down, Depressed, Hopeless 0 0 1 1 0  PHQ - 2 Score 1 1 2 3 1   Altered sleeping 0  1 0 1  Tired, decreased energy 1  1 2 1   Change in appetite 1  1 0 0  Feeling bad or failure about yourself  0  0 1 0  Trouble concentrating 0  0 1 0  Moving slowly or fidgety/restless 0  0 1 0  Suicidal thoughts 0  0 0 0  PHQ-9 Score 3  5  8  3    Difficult doing work/chores Somewhat difficult  Somewhat difficult Somewhat difficult Not difficult at all     Data saved with a previous flowsheet row definition       12/09/2023   10:12 AM 11/15/2023     2:46 PM 09/14/2023   10:07 AM 04/15/2023    9:30 AM  GAD 7 : Generalized Anxiety Score  Nervous, Anxious, on Edge 0 1 1 0  Control/stop worrying 0 1 0 0  Worry too much - different things 0 1 1 0  Trouble relaxing 0 1 0 0  Restless 0 1 0 0  Easily annoyed or irritable 0 1 0 0  Afraid - awful might happen 0 1 1 0  Total GAD 7 Score 0 7 3 0  Anxiety Difficulty Not difficult at all  Not difficult at all Not difficult at all    Relevant past medical, surgical, family and social history reviewed and updated as indicated. Interim medical history since our last visit reviewed. Allergies and medications reviewed and updated.  Review of Systems  Constitutional:  Positive for activity change and fatigue. Negative for appetite change, chills and fever.  Respiratory:  Negative for cough, chest tightness, shortness of breath and wheezing.   Cardiovascular:  Negative for chest pain, palpitations and leg swelling.  Gastrointestinal: Negative.   Neurological:  Positive for weakness. Negative for dizziness, facial asymmetry, speech difficulty, light-headedness, numbness and headaches.  Psychiatric/Behavioral:  Negative for decreased concentration, sleep disturbance and suicidal ideas. The patient is not nervous/anxious.     Per HPI unless specifically indicated above     Objective:    BP 130/73 (BP Location: Left Arm, Patient Position: Sitting, Cuff Size: Large)   Pulse 78   Temp 98.4 F (36.9 C) (Oral)   Resp 16   Ht 5' 4 (1.626 m)   Wt 235 lb (106.6 kg)   SpO2 98%   BMI 40.34 kg/m   Wt Readings from Last 3 Encounters:  12/09/23 235 lb (106.6 kg)  12/06/23 236 lb (107 kg)  12/02/23 239 lb 12.8 oz (108.8 kg)    Physical Exam Vitals and nursing note reviewed.  Constitutional:      General: She is awake. She is not in acute distress.    Appearance: She is well-developed and well-groomed. She is obese. She is not ill-appearing or toxic-appearing.  HENT:     Head: Normocephalic.      Right Ear: Hearing and external ear normal.     Left Ear: Hearing and external ear normal.  Eyes:     General: Lids are normal.        Right eye: No discharge.        Left eye: No discharge.     Conjunctiva/sclera: Conjunctivae normal.     Pupils: Pupils are equal, round, and reactive to light.  Neck:     Thyroid : No thyromegaly.     Vascular: No carotid bruit.  Cardiovascular:     Rate and Rhythm: Normal rate and regular rhythm.     Heart sounds: Normal heart sounds. No murmur heard.    No gallop.  Pulmonary:     Effort: Pulmonary effort is normal. No accessory muscle  usage or respiratory distress.     Breath sounds: Normal breath sounds. No decreased breath sounds, wheezing or rales.  Abdominal:     General: Bowel sounds are normal. There is no distension.     Palpations: Abdomen is soft.     Tenderness: There is no abdominal tenderness.  Musculoskeletal:     Cervical back: Normal range of motion and neck supple.     Right lower leg: No edema.     Left lower leg: No edema.  Lymphadenopathy:     Cervical: No cervical adenopathy.  Skin:    General: Skin is warm and dry.  Neurological:     Mental Status: She is alert and oriented to person, place, and time.     Deep Tendon Reflexes: Reflexes are normal and symmetric.     Reflex Scores:      Brachioradialis reflexes are 2+ on the right side and 2+ on the left side.      Patellar reflexes are 2+ on the right side and 2+ on the left side. Psychiatric:        Attention and Perception: Attention normal.        Mood and Affect: Mood normal.        Speech: Speech normal.        Behavior: Behavior normal. Behavior is cooperative.        Thought Content: Thought content normal.     Results for orders placed or performed in visit on 12/09/23  Bayer DCA Hb A1c Waived   Collection Time: 12/09/23 10:42 AM  Result Value Ref Range   HB A1C (BAYER DCA - WAIVED) 8.1 (H) 4.8 - 5.6 %      Assessment & Plan:   Problem List Items  Addressed This Visit       Cardiovascular and Mediastinum   Hypertension associated with diabetes (HCC)   Chronic, ongoing.  BP at goal today for age. Continue current medication regimen and adjust as needed. Recommend she monitor BP at least a few mornings a week at home and document.  DASH diet at home.  Refills as needed.  Losartan  for kidney protection.  LABS: TSH and CMP.  Continue to collaborate with cardiology. Return in 3 months.       Relevant Medications   metFORMIN  (GLUCOPHAGE ) 1000 MG tablet   Semaglutide,0.25 or 0.5MG /DOS, (OZEMPIC, 0.25 OR 0.5 MG/DOSE,) 2 MG/3ML SOPN   Other Relevant Orders   Bayer DCA Hb A1c Waived (Completed)   Ambulatory referral to Home Health   (HFpEF) heart failure with preserved ejection fraction [facility])   Ongoing, will collaborate with cardiology.  At this time EF 55 to 60% on 09/05/23.  Baseline edema to ankles present. May need to stop Amlodipine  next visit if ongoing HF diagnosis.  Recommend: - Reminded to call for an overnight weight gain of >2 pounds or a weekly weight gain of >5 pounds - not adding salt to food and read food labels. Reviewed the importance of keeping daily sodium intake to 2000mg  daily.  - Avoid Ibuprofen       Relevant Orders   Ambulatory referral to Home Health     Endocrine   Type 2 diabetes mellitus in patient with obesity (HCC) - Primary   Chronic, ongoing with A1c 8.1% today which is trend up, urine ALB 80 September 2025  Taking Metformin  1000 MG BID and Lantus 8 units nightly, started in hospital.  Recommend check BS fasting every morning and document for visits.  Recommend heavy focus  on diabetic diet at home and regular exercise. - Discussed options with her, we will start Ozempic if affordable. This would benefit diabetes and weight. Educated her on this medication and side effects to report. No family history of thyroid  cancer (MTC, MEN 2, thyroid  cell tumors) or pancreatitis.  Long term goal would be to discontinue  insulin . - Eye exam up to date. Will get records. - Foot exam up to date - ARB and Statin on board - Pneumococcal and Flu vaccines up to date.      Relevant Medications   metFORMIN  (GLUCOPHAGE ) 1000 MG tablet   Semaglutide,0.25 or 0.5MG /DOS, (OZEMPIC, 0.25 OR 0.5 MG/DOSE,) 2 MG/3ML SOPN   Other Relevant Orders   Bayer DCA Hb A1c Waived (Completed)   TSH   CBC with Differential/Platelet   Ambulatory referral to Home Health   Hyperlipidemia associated with type 2 diabetes mellitus (HCC)   Chronic, ongoing.  Continue Crestor  and adjust as needed.  CMP and Lipid check today.      Relevant Medications   metFORMIN  (GLUCOPHAGE ) 1000 MG tablet   Semaglutide,0.25 or 0.5MG /DOS, (OZEMPIC, 0.25 OR 0.5 MG/DOSE,) 2 MG/3ML SOPN   Other Relevant Orders   Bayer DCA Hb A1c Waived (Completed)   Lipid Panel w/o Chol/HDL Ratio   Comprehensive metabolic panel with GFR   Ambulatory referral to Home Health     Other   Swelling of left eye   Acute and improved at this time, complete full abx regimen.      Recurrent major depression in partial remission   Chronic, stable.  Denies SI/HI.  Followed by psychiatry -- recent notes reviewed and will maintain current medication regimen as prescribed by them.      Morbid obesity with BMI of 40.0-44.9, adult (HCC)   BMI 40.34 with T2DM, HTN, HLD. Recommended eating smaller high protein, low fat meals more frequently and exercising 30 mins a day 5 times a week with a goal of 10-15lb weight loss in the next 3 months. Patient voiced their understanding and motivation to adhere to these recommendations.       Relevant Medications   metFORMIN  (GLUCOPHAGE ) 1000 MG tablet   Semaglutide,0.25 or 0.5MG /DOS, (OZEMPIC, 0.25 OR 0.5 MG/DOSE,) 2 MG/3ML SOPN   Insomnia due to other mental disorder   Chronic, ongoing.  Followed by psychiatry who fills Belsomra , continue current medication regimen as prescribed by them.      Other Visit Diagnoses       Low hemoglobin        Check CBC and iron/ferritin today.   Relevant Orders   CBC with Differential/Platelet   Ferritin   Iron        Follow up plan: Return in about 4 weeks (around 01/06/2024) for Diabetes -- new meds added.

## 2023-12-09 NOTE — Assessment & Plan Note (Addendum)
 Chronic, ongoing.  BP at goal today for age. Continue current medication regimen and adjust as needed. Recommend she monitor BP at least a few mornings a week at home and document.  DASH diet at home.  Refills as needed.  Losartan  for kidney protection.  LABS: TSH and CMP.  Continue to collaborate with cardiology. Return in 3 months.

## 2023-12-09 NOTE — Progress Notes (Signed)
 Contacted via MyChart, but please call with instructions too in case she does not see.  Good morning Sarah [name], your A1c has returned and as expected it has trended up to 8.1%. As we discussed I would like to start a GLP1 weekly injection and then continue other current medications for now with goal to stop insulin  in future, but not at present. I have sent in Ozempic to start weekly. If you have any questions about how to use pen and inject let us  know, instructions are on the packaging and very good. If any issues obtaining it or if it is too costly let use know.  Continue to check sugars twice a day and write these down for visits. If sugars consistently go <70 then stop insulin  and let me know. Any questions? Keep being awesome!!  Thank you for allowing me to participate in your care.  I appreciate you. Kindest regards, Alysiah Suppa

## 2023-12-09 NOTE — Assessment & Plan Note (Signed)
 Chronic, ongoing with A1c 8.1% today which is trend up, urine ALB 80 September 2025  Taking Metformin  1000 MG BID and Lantus 8 units nightly, started in hospital.  Recommend check BS fasting every morning and document for visits.  Recommend heavy focus on diabetic diet at home and regular exercise. - Discussed options with her, we will start Ozempic if affordable. This would benefit diabetes and weight. Educated her on this medication and side effects to report. No family history of thyroid  cancer (MTC, MEN 2, thyroid  cell tumors) or pancreatitis.  Long term goal would be to discontinue insulin . - Eye exam up to date. Will get records. - Foot exam up to date - ARB and Statin on board - Pneumococcal and Flu vaccines up to date.

## 2023-12-09 NOTE — Assessment & Plan Note (Signed)
Chronic, ongoing.  Continue Crestor and adjust as needed.  CMP and Lipid check today.

## 2023-12-10 LAB — CBC WITH DIFFERENTIAL/PLATELET
Basophils Absolute: 0.1 x10E3/uL (ref 0.0–0.2)
Basos: 1 %
EOS (ABSOLUTE): 0.2 x10E3/uL (ref 0.0–0.4)
Eos: 2 %
Hematocrit: 37.1 % (ref 34.0–46.6)
Hemoglobin: 11.4 g/dL (ref 11.1–15.9)
Immature Grans (Abs): 0.1 x10E3/uL (ref 0.0–0.1)
Immature Granulocytes: 1 %
Lymphocytes Absolute: 1.5 x10E3/uL (ref 0.7–3.1)
Lymphs: 15 %
MCH: 27.5 pg (ref 26.6–33.0)
MCHC: 30.7 g/dL — ABNORMAL LOW (ref 31.5–35.7)
MCV: 89 fL (ref 79–97)
Monocytes Absolute: 0.6 x10E3/uL (ref 0.1–0.9)
Monocytes: 6 %
Neutrophils Absolute: 7.6 x10E3/uL — ABNORMAL HIGH (ref 1.4–7.0)
Neutrophils: 75 %
Platelets: 322 x10E3/uL (ref 150–450)
RBC: 4.15 x10E6/uL (ref 3.77–5.28)
RDW: 14.1 % (ref 11.7–15.4)
WBC: 10 x10E3/uL (ref 3.4–10.8)

## 2023-12-10 LAB — COMPREHENSIVE METABOLIC PANEL WITH GFR
ALT: 16 IU/L (ref 0–32)
AST: 10 IU/L (ref 0–40)
Albumin: 4.3 g/dL (ref 3.8–4.8)
Alkaline Phosphatase: 67 IU/L (ref 49–135)
BUN/Creatinine Ratio: 15 (ref 12–28)
BUN: 19 mg/dL (ref 8–27)
Bilirubin Total: 0.3 mg/dL (ref 0.0–1.2)
CO2: 20 mmol/L (ref 20–29)
Calcium: 9.7 mg/dL (ref 8.7–10.3)
Chloride: 99 mmol/L (ref 96–106)
Creatinine, Ser: 1.31 mg/dL — ABNORMAL HIGH (ref 0.57–1.00)
Globulin, Total: 2.1 g/dL (ref 1.5–4.5)
Glucose: 185 mg/dL — ABNORMAL HIGH (ref 70–99)
Potassium: 4.6 mmol/L (ref 3.5–5.2)
Sodium: 139 mmol/L (ref 134–144)
Total Protein: 6.4 g/dL (ref 6.0–8.5)
eGFR: 43 mL/min/1.73 — ABNORMAL LOW (ref >59)

## 2023-12-10 LAB — LIPID PANEL W/O CHOL/HDL RATIO
Cholesterol, Total: 148 mg/dL (ref 100–199)
HDL: 52 mg/dL (ref >39)
LDL Chol Calc (NIH): 75 mg/dL (ref 0–99)
Triglycerides: 120 mg/dL (ref 0–149)
VLDL Cholesterol Cal: 21 mg/dL (ref 5–40)

## 2023-12-10 LAB — IRON: Iron: 68 ug/dL (ref 27–139)

## 2023-12-10 LAB — FERRITIN: Ferritin: 47 ng/mL (ref 15–150)

## 2023-12-10 LAB — TSH: TSH: 1.01 u[IU]/mL (ref 0.450–4.500)

## 2023-12-10 NOTE — Progress Notes (Signed)
 Contacted via MyChart  Good afternoon Adrienna, your labs have returned: - Kidney function, creatinine ane eGFR, shows mild trend down this check. Recommend ensuring good water intake daily and avoid Ibuprofen . We will recheck next visit.  Liver function normal, ALT and AST. - CBC shows blood counts improving. Great news!! - Remainder of labs stable. Any questions? Keep being amazing!!  Thank you for allowing me to participate in your care.  I appreciate you. Kindest regards, Annelise Mccoy

## 2023-12-12 NOTE — Progress Notes (Unsigned)
 Cardiology Office Note   Date:  12/15/2023  ID:  Sarah [name], DOB 05-03-1950, MRN [id number] PCP: Valerio Melanie DASEN, NP  [facility] Providers Cardiologist:  Caron Poser, MD     History of Present Illness Sarah [name] is a 73 y.o. 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  Patient reports ever since she was hospitalized back in August for DVT, she has had dizziness with standing.  She denies any symptoms of vertigo.  She denies any syncope.  She denies any ongoing DOE.  She does have chronic lower extremity edema for which she takes Lasix .  Orthostatic vital signs are mildly positive today.  Relevant CVD History -Monitor pending -CTPA 08/2023 with aortic atherosclerosis and CAC -TTE 08/2023 LVEF 55 to 60%, grade 2 diastolic dysfunction, normal RV function, no significant valvular disease. On my personal review, the diastolic parameters are normal and do not show elevated filling pressures. LA size is normal. TR is trivial.   ROS: Pt denies any chest discomfort, jaw pain, arm pain, palpitations, syncope, orthopnea, PND, or LE edema.  Studies Reviewed I have independently reviewed the patient's ECG, previous medical records, recent blood work, previous cardiac testing.  Physical Exam VS:  BP (!) 146/78 (BP Location: Left Arm, Patient Position: Sitting, Cuff Size: Normal)   Pulse 77   Ht 5' 4 (1.626 m)   Wt 241 lb (109.3 kg)   SpO2 98%   BMI 41.37 kg/m   Orthostatic VS for the past 24 hrs (Last 3 readings):  BP- Lying Pulse- Lying BP- Sitting Pulse- Sitting BP- Standing at 0 minutes Pulse- Standing at 0 minutes BP- Standing at 3 minutes Pulse- Standing at 3 minutes  12/15/23 1449 166/79 77 151/78 78 136/78 81 153/79 84      Wt Readings from Last 3 Encounters:  12/15/23 241 lb (109.3 kg)  12/09/23 235 lb (106.6 kg)  12/06/23 236 lb (107 kg)    GEN: No acute  distress. NECK: No JVD; No carotid bruits. CARDIAC: RRR, no murmurs, rubs, gallops. RESPIRATORY:  Clear to auscultation. EXTREMITIES:  Warm and well-perfused. No edema.  ASSESSMENT AND PLAN Dizziness Palpitations Paroxysmal tachycardia Patient presents with dizziness and palpitation.  She notes this seems to occur mostly with standing.  She has not had any syncopal episodes.  She had a recent echocardiogram which was essentially normal and ruled out any obvious structural heart disease that would be contributory.  Somewhat difficult volume exam, but her neck veins are at her clavicle today and she has a mild orthostasis based on orthostatic vital signs. She also has clear lung fields and dry mucus membranes.  She notes she is able to lie flat at night.  She does have lower extremity edema which is chronic.  Somewhat unclear, but I have low suspicion that this is a primary cardiac etiology and have higher suspicion that this could be related to polypharmacy or autonomic insufficiency.  Plan: - Zio monitor to rule out arrhythmia - Would do a short trial of holding Lasix  to see if this helps with her symptoms; she does have lower extremity edema but clear lungs and JVP at the clavicle with dry mucous membranes which suggests low intravascular volume.  She is also able to lay flat at night.  I think the lower extremity edema is likely chronic from venous insufficiency as well as chronic steroid use given Addison's disease. - If the above is unrevealing  or unhelpful with her symptoms, then it is probably worth trying to hold some of her other medications that could cause orthostasis since she has an extensive medication list - Can consider RHC if volume assessment continues to be discrepant  CAC Aortic atherosclerosis HLD Seen on prior CT scan.  Last LDL 46 09/2023.  Continue Crestor  10 mg daily.  No aspirin  for now given Eliquis  for unprovoked DVT.        Dispo: RTC 1 month or sooner  PRN  Signed, Caron Poser, MD

## 2023-12-13 ENCOUNTER — Ambulatory Visit: Admitting: Nurse Practitioner

## 2023-12-15 ENCOUNTER — Ambulatory Visit

## 2023-12-15 VITALS — BP 146/78 | HR 77 | Ht 64.0 in | Wt 241.0 lb

## 2023-12-15 DIAGNOSIS — R42 Dizziness and giddiness: Secondary | ICD-10-CM

## 2023-12-15 DIAGNOSIS — I479 Paroxysmal tachycardia, unspecified: Secondary | ICD-10-CM

## 2023-12-15 DIAGNOSIS — I2510 Atherosclerotic heart disease of native coronary artery without angina pectoris: Secondary | ICD-10-CM | POA: Diagnosis not present

## 2023-12-15 DIAGNOSIS — I700 Atherosclerosis of aorta: Secondary | ICD-10-CM

## 2023-12-15 DIAGNOSIS — E782 Mixed hyperlipidemia: Secondary | ICD-10-CM

## 2023-12-15 NOTE — Patient Instructions (Signed)
 Medication Instructions:  Your physician recommends the following medication changes.  HOLD LASIX ; will review at 1 month follow up  Continue all other medications as prescribed *If you need a refill on your cardiac medications before your next appointment, please call your pharmacy*  Lab Work: No labs ordered today  If you have labs (blood work) drawn today and your tests are completely normal, you will receive your results only by: MyChart Message (if you have MyChart) OR A paper copy in the mail If you have any lab test that is abnormal or we need to change your treatment, we will call you to review the results.  Testing/Procedures: Sarah [name]- Long Term Monitor Instructions  Your physician has requested you wear a ZIO patch monitor for 14 days.  This is a single patch monitor. [employer] supplies one patch monitor per enrollment. Additional stickers are not available. Please do not apply patch if you will be having a Nuclear Stress Test, Echocardiogram, Cardiac CT, MRI, or Chest Xray during the period you would be wearing the monitor. The patch cannot be worn during these tests. You cannot remove and re-apply the ZIO XT patch monitor.  Your ZIO patch monitor will be mailed 3 day USPS to your address on file. It may take 3-5 days to receive your monitor after you have been enrolled. Once you have received your monitor, please review the enclosed instructions. Your monitor has already been registered assigning a specific monitor serial number to you.  Billing and Patient Assistance Program Information  We have supplied [employer] with any of your insurance information on file for billing purposes.  [employer] offers a sliding scale Patient Assistance Program for patients that do not have insurance, or whose insurance does not completely cover the cost of the ZIO monitor.  You must apply for the Patient Assistance Program to qualify for this discounted rate.  To apply, please call [employer] at  [phone], select option 4, select option 2, ask to apply for Patient Assistance Program. Meredeth will ask your household income, and how many people are in your household. They will quote your out-of-pocket cost based on that information. [employer] will also be able to set up a 91-month, interest-free payment plan if needed.  Applying the monitor  Hold abrader disc by orange tab. Rub abrader in 40 strokes over the upper left chest as indicated in your monitor instructions.  Clean area with 4 enclosed alcohol pads. Let dry.  Apply patch as indicated in monitor instructions. Patch will be placed under collarbone on left side of chest with arrow pointing upward.  Rub patch adhesive wings for 2 minutes. Remove white label marked 1. Remove the white label marked 2. Rub patch adhesive wings for 2 additional minutes.  While looking in a mirror, press and release button in center of patch. A small green light will flash 3-4 times. This will be your only indicator that the monitor has been turned on.   After Applying Monitor: Do not shower for the first 24 hours. You may shower after the first 24 hours.  Press the button if you feel a symptom. You will hear a small click. Record Date, Time and Symptom in the Patient Logbook.   After Completing 14 Days: When you are ready to remove the patch, follow instructions on the last 2 pages of Patient Logbook.  Stick patch monitor into the tabs at the bottom of the return box.  Place Patient Logbook in the blue and white box. Use locking tab  on box and tape box closed securely. The blue and white box has prepaid postage on it. Please place it in the mailbox as soon as possible. Your physician should have your test results approximately 7-14 days after the monitor has been mailed back to [facility].   Troubleshooting: Call [facility] at [phone] if you have questions regarding your ZIO XT patch monitor.  Call them immediately if you  see an orange light blinking on your monitor.  If your monitor falls off in less than 4 days, contact our Monitor department at [phone].  If your monitor becomes loose or falls off after 4 days call [employer] at [phone] for suggestions on securing your monitor.   Follow-Up: At [facility], you and your health needs are our priority.  As part of our continuing mission to provide you with exceptional heart care, our providers are all part of one team.  This team includes your primary Cardiologist (physician) and Advanced Practice Providers or APPs (Physician Assistants and Nurse Practitioners) who all work together to provide you with the care you need, when you need it.  Your next appointment:   1 month(s)  Provider:   You may see Caron Poser, MD or one of the following Advanced Practice Providers on your designated Care Team:   Lonni Meager, NP Lesley Maffucci, PA-C Bernardino Bring, PA-C Cadence Carroll, PA-C Tylene Lunch, NP Barnie Hila, NP  We recommend signing up for the patient portal called MyChart.  Sign up information is provided on this After Visit Summary.  MyChart is used to connect with patients for Virtual Visits (Telemedicine).  Patients are able to view lab/test results, encounter notes, upcoming appointments, etc.  Non-urgent messages can be sent to your provider as well.   To learn more about what you can do with MyChart, go to [email or web address].

## 2023-12-19 ENCOUNTER — Other Ambulatory Visit: Payer: Self-pay | Admitting: Nurse Practitioner

## 2023-12-20 ENCOUNTER — Encounter (INDEPENDENT_AMBULATORY_CARE_PROVIDER_SITE_OTHER): Payer: Self-pay | Admitting: Nurse Practitioner

## 2023-12-20 ENCOUNTER — Ambulatory Visit (INDEPENDENT_AMBULATORY_CARE_PROVIDER_SITE_OTHER): Admitting: Nurse Practitioner

## 2023-12-20 VITALS — BP 147/80 | HR 82 | Resp 18 | Ht 64.5 in | Wt 233.2 lb

## 2023-12-20 DIAGNOSIS — E669 Obesity, unspecified: Secondary | ICD-10-CM

## 2023-12-20 DIAGNOSIS — E119 Type 2 diabetes mellitus without complications: Secondary | ICD-10-CM

## 2023-12-20 DIAGNOSIS — I890 Lymphedema, not elsewhere classified: Secondary | ICD-10-CM | POA: Diagnosis not present

## 2023-12-20 DIAGNOSIS — E1169 Type 2 diabetes mellitus with other specified complication: Secondary | ICD-10-CM

## 2023-12-20 DIAGNOSIS — E785 Hyperlipidemia, unspecified: Secondary | ICD-10-CM

## 2023-12-20 DIAGNOSIS — I871 Compression of vein: Secondary | ICD-10-CM

## 2023-12-20 MED ORDER — APIXABAN 5 MG PO TABS: 5.0000 mg | ORAL_TABLET | Freq: Two times a day (BID) | ORAL | 1 refills | Status: AC

## 2023-12-20 NOTE — Progress Notes (Signed)
 Subjective:    Patient ID: Sarah [name], female    DOB: 02-24-1950, 73 y.o.   MRN: [id number] Chief Complaint  Patient presents with   Establish Care    New patient consult. DVT done 11/05/23 acute deep vein thrombosis of femoral vein of LLE ref. Cannady    HPI  Discussed the use of AI scribe software for clinical note transcription with the patient, who gave verbal consent to proceed.  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   She experienced pain and swelling in her right  leg, prompting an emergency room visit where a blood clot was discovered. A thrombectomy was performed on October 20th to remove the clot. Currently, she feels good with minimal swelling and no pain.  She is taking Eliquis  5 mg twice a day and is inquiring about the duration of this treatment. 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 She manages this with compression socks and has been taught how to wrap her legs to manage swelling. She reports that her left leg has had persistent swelling since the melanoma treatment.  She lives at [facility] and had a nurse advise her to go to the emergency room when her leg began swelling and looking 'really nasty'. She uses compression socks regularly, which help to some extent, but her left ankle still swells significantly.  No family history of blood clots. She is concerned about the cost of Eliquis , although her current insurance covers it.    Results DIAGNOSTIC Thrombectomy: Performed for thrombus removal, no stent placed, angioplasty conducted (11/08/2023)   Review of Systems  Cardiovascular:  Positive for leg swelling.  Musculoskeletal:  Positive for gait problem.  All other systems reviewed and are negative.      Objective:   Physical Exam Vitals reviewed.  HENT:     Head:  Normocephalic.  Cardiovascular:     Rate and Rhythm: Normal rate.  Pulmonary:     Effort: Pulmonary effort is normal.  Skin:    General: Skin is warm and dry.  Neurological:     Mental Status: She is alert and oriented to person, place, and time.  Psychiatric:        Mood and Affect: Mood normal.        Behavior: Behavior normal.        Thought Content: Thought content normal.        Judgment: Judgment normal.     Physical Exam EXTREMITIES: Left leg 1+ edema, right leg 2+ edema.  BP (!) 147/80 (BP Location: Right Arm, Patient Position: Sitting)   Pulse 82   Resp 18   Ht 5' 4.5 (1.638 m)   Wt 233 lb 3.2 oz (105.8 kg)   BMI 39.41 kg/m   Past Medical History:  Diagnosis Date   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HCC)    Adrenal insufficiency    Anxiety    B12 deficiency    Cancer (HCC) 2018-2019   melanoma   Cervical radiculitis    Constipation    Dependent edema    Depression    Diabetes mellitus    PRE,NO MEDICATIONS AS OF 03/05/21   DVT (deep venous thrombosis)_left leg extensive DVT 11/05/2023   11/05/23 LLE     Dysmenorrhea    H/O: suicide attempt    History of antineoplastic chemotherapy  History of chemotherapy    Hyperlipidemia    Hypertension    Insomnia    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HCC)    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)    PMS (premenstrual syndrome)     Social History   Socioeconomic History   Marital status: Widowed    Spouse name: Not on file   Number of children: 1   Years of education: 15   Highest education level: High school graduate  Occupational History   Occupation: Retired  Tobacco Use   Smoking status: Never    Passive exposure: Never   Smokeless tobacco: Never  Vaping Use   Vaping status: Never Used  Substance and Sexual Activity   Alcohol use: Not Currently   Drug use: Never   Sexual activity: Not Currently  Other Topics Concern   Not on file  Social History Narrative   Daughter lives in [place].  Patient lives  [address] in [place].    Social Drivers of Corporate Investment Banker Strain: Low Risk  (10/24/2023)   Received from [facility]   Overall Financial Resource Strain (CARDIA)    Difficulty of Paying Living Expenses: Not hard at all  Food Insecurity: No Food Insecurity (11/06/2023)   Hunger Vital Sign    Worried About Running Out of Food in the Last Year: Never true    Ran Out of Food in the Last Year: Never true  Transportation Needs: No Transportation Needs (11/07/2023)   PRAPARE - Administrator, Civil Service (Medical): No    Lack of Transportation (Non-Medical): No  Physical Activity: Insufficiently Active (09/29/2022)   Exercise Vital Sign    Days of Exercise per Week: 3 days    Minutes of Exercise per Session: 40 min  Stress: No Stress Concern Present (09/29/2022)   [employer] of Occupational Health - Occupational Stress Questionnaire    Feeling of Stress : Not at all  Social Connections: Moderately Integrated (11/07/2023)   Social Connection and Isolation Panel    Frequency of Communication with Friends and Family: More than three times a week    Frequency of Social Gatherings with Friends and Family: More than three times a week    Attends Religious Services: More than 4 times per year    Active Member of [employer] or Organizations: Yes    Attends Banker Meetings: Never    Marital Status: Widowed  Recent Concern: Social Connections - Moderately Isolated (09/05/2023)   Social Connection and Isolation Panel    Frequency of Communication with Friends and Family: More than three times a week    Frequency of Social Gatherings with Friends and Family: More than three times a week    Attends Religious Services: More than 4 times per year    Active Member of [employer] or Organizations: No    Attends Banker Meetings: Never    Marital Status: Widowed  Intimate Partner Violence: Not At Risk (11/09/2023)    Humiliation, Afraid, Rape, and Kick questionnaire    Fear of Current or Ex-Partner: No    Emotionally Abused: No    Physically Abused: No    Sexually Abused: No    Past Surgical History:  Procedure Laterality Date   BREAST BIOPSY Right    benign   CHOLECYSTECTOMY     COLONOSCOPY  11/08/2018   Danis-polyps   COLONOSCOPY  12/05/2019   Danis   DILATION AND CURETTAGE OF UTERUS  FOOT SURGERY Right 2018   removal of melanoma   LUMBAR FUSION     lymph node removal Right 2018   groin    PERIPHERAL VASCULAR THROMBECTOMY Left 11/08/2023   Procedure: PERIPHERAL VASCULAR THROMBECTOMY;  Surgeon: Marea Selinda RAMAN, MD;  Location: [facility] INVASIVE CV [facility];  Service: Cardiovascular;  Laterality: Left;   POLYPECTOMY     TOTAL ABDOMINAL HYSTERECTOMY W/ BILATERAL SALPINGOOPHORECTOMY     TUBAL LIGATION      Family History  Problem Relation Age of Onset   Depression Mother    Anxiety disorder Mother    Bipolar disorder Mother    Diabetes Mother    Mental illness Mother    Heart disease Mother    Stroke Mother    Lung cancer Father    Diabetes Maternal Grandmother    Heart attack Maternal Grandmother    CVA Paternal Grandmother    Colon cancer Neg Hx    Liver cancer Neg Hx    Pancreatic cancer Neg Hx    Esophageal cancer Neg Hx    Stomach cancer Neg Hx    Rectal cancer Neg Hx    Colon polyps Neg Hx    Breast cancer Neg Hx     Allergies  Allergen Reactions   Clindamycin/Lincomycin Anaphylaxis   Doxycycline  Rash   Aspirin  Other (See Comments)    Pt is unsure of reaction, but states that she is allergic to aspirin    Ciprofloxacin Rash    very bad rash   Penicillins Rash    Has patient had a PCN reaction causing immediate rash, facial/tongue/throat swelling, SOB or lightheadedness with hypotension: No Has patient had a PCN reaction causing severe rash involving mucus membranes or skin necrosis: No Has patient had a PCN reaction that required hospitalization: No Has patient had a  PCN reaction occurring within the last 10 years: No If all of the above answers are NO, then may proceed with Cephalosporin use.        Latest Ref Rng & Units 12/09/2023   10:43 AM 12/02/2023   11:31 AM 11/29/2023   12:00 AM  CBC  WBC 3.4 - 10.8 x10E3/uL 10.0  14.9  7.2      Hemoglobin 11.1 - 15.9 g/dL 88.5  88.4  89.2      Hematocrit 34.0 - 46.6 % 37.1  36.4  33      Platelets 150 - 450 x10E3/uL 322  384  355         This result is from an external source.      CMP     Component Value Date/Time   NA 139 12/09/2023 1043   NA 146 (H) 06/04/2011 0148   K 4.6 12/09/2023 1043   K 3.4 (L) 06/04/2011 0148   CL 99 12/09/2023 1043   CL 110 (H) 06/04/2011 0148   CO2 20 12/09/2023 1043   CO2 26 06/04/2011 0148   GLUCOSE 185 (H) 12/09/2023 1043   GLUCOSE 254 (H) 12/02/2023 1131   GLUCOSE 111 (H) 06/04/2011 0148   BUN 19 12/09/2023 1043   BUN 10 06/04/2011 0148   CREATININE 1.31 (H) 12/09/2023 1043   CREATININE 0.97 11/25/2023 1357   CALCIUM  9.7 12/09/2023 1043   CALCIUM  7.5 (L) 06/04/2011 0148   PROT 6.4 12/09/2023 1043   PROT 6.3 (L) 06/03/2011 0945   ALBUMIN 4.3 12/09/2023 1043   ALBUMIN 3.3 (L) 06/03/2011 0945   AST 10 12/09/2023 1043   AST 15 06/03/2011 0945   ALT 16 12/09/2023  1043   ALT 12 06/03/2011 0945   ALKPHOS 67 12/09/2023 1043   ALKPHOS 56 06/03/2011 0945   BILITOT 0.3 12/09/2023 1043   BILITOT 0.4 06/03/2011 0945   EGFR 43 (L) 12/09/2023 1043   GFRNONAA >60 12/02/2023 1131   GFRNONAA 67 04/23/2020 1150     No results found.     Assessment & Plan:   1. May-Thurner syndrome (Primary) Left lower extremity deep vein thrombosis due to May-Thurner syndrome, post-thrombectomy Post-thrombectomy for DVT secondary to May-Thurner syndrome. On Eliquis  with low recurrence risk. Compression therapy and leg elevation recommended. - Continue Eliquis  5 mg twice daily for at least 6 months. - Ordered ultrasound of the left lower extremity to assess DVT  status. - Scheduled follow-up in one month to review ultrasound and discuss management. - Advised wearing knee-high compression socks, especially in the morning, removing before bedtime. - Encouraged leg elevation and regular activity to manage swelling.  2. Hyperlipidemia associated with type 2 diabetes mellitus (HCC) Continue statin as ordered and reviewed, no changes at this time  3. Type 2 diabetes mellitus in patient with obesity (HCC) Continue hypoglycemic medications as already ordered, these medications have been reviewed and there are no changes at this time.  Hgb A1C to be monitored as already arranged by primary service   4. Lymphedema Left lower extremity lymphedema Chronic lymphedema likely due to previous melanoma surgery. Compression therapy effective but persistent ankle swelling noted. - Continue compression therapy with knee-high socks. - Consider lymphedema pumps if compression becomes insufficient. - Monitor swelling and adjust management as needed.     Current Outpatient Medications on File Prior to Visit  Medication Sig Dispense Refill   acetaminophen  (TYLENOL ) 325 MG tablet Take 650 mg by mouth every 4 (four) hours as needed.     albuterol  (PROVENTIL ) (2.5 MG/3ML) 0.083% nebulizer solution Inhale 3 mLs (2.5 mg total) into the lungs every 4 (four) hours as needed for wheezing or shortness of breath. 75 mL 0   amitriptyline  (ELAVIL ) 50 MG tablet Take 1 tablet (50 mg total) by mouth at bedtime. 30 tablet 0   amLODipine  (NORVASC ) 2.5 MG tablet Take 1 tablet (2.5 mg total) by mouth daily. 30 tablet 0   Blood Glucose Monitoring Suppl (ONETOUCH VERIO) w/Device KIT Use to check blood sugar 3 times a day and document results, bring to appointments.  Goal is <130 fasting blood sugar and <180 two hours after meals. 1 kit 0   Cholecalciferol (VITAMIN D ) 50 MCG (2000 UT) CAPS Take 1 capsule by mouth at bedtime.     Cyanocobalamin (VITAMIN B-12 PO) Take 1 tablet by mouth at  bedtime.     EPINEPHrine  (EPIPEN  2-PAK) 0.3 mg/0.3 mL IJ SOAJ injection Inject 0.3 mg into the muscle as needed for anaphylaxis. 2 each 0   escitalopram  (LEXAPRO ) 10 MG tablet Take 10 mg by mouth daily.     FIBER ADULT GUMMIES PO Take 1 tablet by mouth daily as needed.     furosemide  (LASIX ) 40 MG tablet Take 1 tablet (40 mg total) by mouth daily.     insulin  glargine (LANTUS ) 100 UNIT/ML Solostar Pen Inject 8 Units into the skin daily. 10 mL 0   losartan  (COZAAR ) 50 MG tablet Take 1 tablet (50 mg total) by mouth daily. 30 tablet 0   meclizine  (ANTIVERT ) 25 MG tablet Take 1 tablet (25 mg total) by mouth 3 (three) times daily as needed for dizziness. 60 tablet 0   metFORMIN  (GLUCOPHAGE ) 1000 MG tablet Take  1,000 mg by mouth 2 (two) times daily with a meal.     OneTouch UltraSoft 2 Lancets MISC USE TO CHECK BLOOD SUGAR 3 TIMES A DAY AND DOCUMENT RESULTS, BRING TO APPOINTMENTS. GOAL IS <130 FASTING BLOOD SUGAR AND <180 TWO HOURS AFTER MEALS. 100 each 2   pantoprazole  (PROTONIX ) 40 MG tablet Take 1 tablet (40 mg total) by mouth daily. 30 tablet 0   pregabalin  (LYRICA ) 150 MG capsule Take 150 mg by mouth daily. For neuropathy pain see other listing     pregabalin  (LYRICA ) 150 MG capsule Take 300 mg by mouth at bedtime.     rosuvastatin  (CRESTOR ) 10 MG tablet Take 1 tablet (10 mg total) by mouth daily. 30 tablet 0   Semaglutide ,0.25 or 0.5MG /DOS, (OZEMPIC , 0.25 OR 0.5 MG/DOSE,) 2 MG/3ML SOPN Start with 0.25 MG injected into the skin once a week x 4 weeks, then on week 5 increase to 0.5 MG weekly injected into the skin. 3 mL 4   Suvorexant  (BELSOMRA ) 15 MG TABS Take 1 tablet (15 mg total) by mouth at bedtime as needed. 30 tablet 0   Docosanol (ABREVA) 10 % CREA Apply 1 Application topically 5 (five) times daily. Apply to lips for cold sores (Patient not taking: Reported on 12/20/2023)     hydroxypropyl methylcellulose / hypromellose (ISOPTO TEARS / GONIOVISC) 2.5 % ophthalmic solution Place 2 drops into  both eyes 3 (three) times daily. (Patient not taking: Reported on 12/20/2023)     No current facility-administered medications on file prior to visit.    There are no Patient Instructions on file for this visit. Return in about 4 weeks (around 01/17/2024) for DVT.   Sarah [name] E Nhi Butrum, NP

## 2023-12-23 ENCOUNTER — Ambulatory Visit

## 2023-12-23 VITALS — Ht 64.5 in | Wt 225.0 lb

## 2023-12-23 DIAGNOSIS — Z0000 Encounter for general adult medical examination without abnormal findings: Secondary | ICD-10-CM

## 2023-12-23 DIAGNOSIS — Z1231 Encounter for screening mammogram for malignant neoplasm of breast: Secondary | ICD-10-CM

## 2023-12-23 NOTE — Progress Notes (Signed)
 Chief Complaint  Patient presents with   Medicare Wellness     Subjective:   Sarah [name] is a 73 y.o. female who presents for a Medicare Annual Wellness Visit.  Visit info / Clinical Intake: Medicare Wellness Visit Type:: Subsequent Annual Wellness Visit Persons participating in visit and providing information:: patient Medicare Wellness Visit Mode:: Telephone If telephone:: video declined Since this visit was completed virtually, some vitals may be partially provided or unavailable. Missing vitals are due to the limitations of the virtual format.: Documented vitals are patient reported If Telephone or Video please confirm:: I connected with patient using audio/video enable telemedicine. I verified patient identity with two identifiers, discussed telehealth limitations, and patient agreed to proceed. Patient Location:: home Provider Location:: home Interpreter Needed?: No Pre-visit prep was completed: yes AWV questionnaire completed by patient prior to visit?: no Living arrangements:: in retirement community [facility]) Patient's Overall Health Status Rating: good Typical amount of pain: (!) a lot Does pain affect daily life?: (!) yes Are you currently prescribed opioids?: no  Dietary Habits and Nutritional Risks How many meals a day?: 2 Eats fruit and vegetables daily?: yes Most meals are obtained by: preparing own meals In the last 2 weeks, have you had any of the following?: none Diabetic:: (!) yes Any non-healing wounds?: no How often do you check your BS?: 1 (FBS 149 per patient) Would you like to be referred to a Nutritionist or for Diabetic Management? : no  Functional Status Activities of Daily Living (to include ambulation/medication): Independent Ambulation: Independent with device- listed below Home Assistive Devices/Equipment: Walker (specify Type); Eyeglasses (rollator, uses when going outside her home, glasses for reading) Medication Administration:  Independent Home Management (perform basic housework or laundry): Independent Manage your own finances?: yes Primary transportation is: driving Concerns about vision?: no *vision screening is required for WTM* Concerns about hearing?: no  Fall Screening Falls in the past year?: 1 Number of falls in past year: 1 Was there an injury with Fall?: 0 Fall Risk Category Calculator: 2 Patient Fall Risk Level: Moderate Fall Risk  Fall Risk Patient at Risk for Falls Due to: History of fall(s); Impaired balance/gait; Impaired mobility; Orthopedic patient Fall risk Follow up: Falls evaluation completed; Education provided; Falls prevention discussed  Home and Transportation Safety: All rugs have non-skid backing?: yes All stairs or steps have railings?: yes Grab bars in the bathtub or shower?: yes Have non-skid surface in bathtub or shower?: yes Good home lighting?: yes Regular seat belt use?: yes Hospital stays in the last year:: (!) yes How many hospital stays:: 4 Reason: 1.severe allergic reaction 2. pneumonia 3. allergic reaction 4. blood clot leg  Cognitive Assessment Difficulty concentrating, remembering, or making decisions? : no Will 6CIT or Mini Cog be Completed: yes What year is it?: 0 points What month is it?: 0 points Give patient an address phrase to remember (5 components): [address] [place] About what time is it?: 0 points Count backwards from 20 to 1: 0 points Say the months of the year in reverse: 0 points Repeat the address phrase from earlier: 0 points 6 CIT Score: 0 points  Advance Directives (For Healthcare) Does Patient Have a Medical Advance Directive?: Yes Does patient want to make changes to medical advance directive?: No - Patient declined Type of Advance Directive: Living will Copy of Living Will in Chart?: No - copy requested Would patient like information on creating a medical advance directive?: No - Patient declined  Reviewed/Updated  Reviewed/Updated: Reviewed All (Medical, Surgical, Family, Medications, Allergies, Care Teams, Patient Goals)    Allergies (verified) Clindamycin/lincomycin, Doxycycline , Aspirin , Ciprofloxacin, and Penicillins   Current Medications (verified) Outpatient Encounter Medications as of 12/23/2023  Medication Sig   acetaminophen  (TYLENOL ) 325 MG tablet Take 650 mg by mouth every 4 (four) hours as needed.   albuterol  (PROVENTIL ) (2.5 MG/3ML) 0.083% nebulizer solution Inhale 3 mLs (2.5 mg total) into the lungs every 4 (four) hours as needed for wheezing or shortness of breath.   amitriptyline  (ELAVIL ) 50 MG tablet Take 1 tablet (50 mg total) by mouth at bedtime.   amLODipine  (NORVASC ) 2.5 MG tablet Take 1 tablet (2.5 mg total) by mouth daily.   apixaban  (ELIQUIS ) 5 MG TABS tablet Take 1 tablet (5 mg total) by mouth 2 (two) times daily.   Blood Glucose Monitoring Suppl (ACCU-CHEK GUIDE) w/Device KIT TO CHECK BLOOD SUGAR 2-3 TIMES DAILY AND DOCUMENT FOR VISITS.   Cholecalciferol (VITAMIN D ) 50 MCG (2000 UT) CAPS Take 1 capsule by mouth at bedtime.   Cyanocobalamin (VITAMIN B-12 PO) Take 1 tablet by mouth at bedtime.   EPINEPHrine  (EPIPEN  2-PAK) 0.3 mg/0.3 mL IJ SOAJ injection Inject 0.3 mg into the muscle as needed for anaphylaxis.   escitalopram  (LEXAPRO ) 10 MG tablet Take 10 mg by mouth daily.   FIBER ADULT GUMMIES PO Take 1 tablet by mouth daily as needed.   furosemide  (LASIX ) 40 MG tablet Take 1 tablet (40 mg total) by mouth daily.   insulin  glargine (LANTUS ) 100 UNIT/ML Solostar Pen Inject 8 Units into the skin daily.   losartan  (COZAAR ) 50 MG tablet Take 1 tablet (50 mg total) by mouth daily.   meclizine  (ANTIVERT ) 25 MG tablet Take 1 tablet (25 mg total) by mouth 3 (three) times daily as needed for dizziness.   metFORMIN  (GLUCOPHAGE ) 1000 MG tablet Take 1,000 mg by mouth 2 (two) times daily with a meal.   OneTouch UltraSoft 2 Lancets MISC USE TO CHECK BLOOD SUGAR 3 TIMES A DAY AND DOCUMENT  RESULTS, BRING TO APPOINTMENTS. GOAL IS <130 FASTING BLOOD SUGAR AND <180 TWO HOURS AFTER MEALS.   pantoprazole  (PROTONIX ) 40 MG tablet Take 1 tablet (40 mg total) by mouth daily.   pregabalin  (LYRICA ) 150 MG capsule Take 150 mg by mouth daily. For neuropathy pain see other listing (Patient taking differently: Take 150 mg by mouth daily. For neuropathy pain see other listing  Taking 1 in the morning and 1 in the afternoon)   pregabalin  (LYRICA ) 150 MG capsule Take 300 mg by mouth at bedtime.   rosuvastatin  (CRESTOR ) 10 MG tablet Take 1 tablet (10 mg total) by mouth daily.   Semaglutide ,0.25 or 0.5MG /DOS, (OZEMPIC , 0.25 OR 0.5 MG/DOSE,) 2 MG/3ML SOPN Start with 0.25 MG injected into the skin once a week x 4 weeks, then on week 5 increase to 0.5 MG weekly injected into the skin.   Suvorexant  (BELSOMRA ) 15 MG TABS Take 1 tablet (15 mg total) by mouth at bedtime as needed.   Docosanol (ABREVA) 10 % CREA Apply 1 Application topically 5 (five) times daily. Apply to lips for cold sores (Patient not taking: Reported on 12/23/2023)   hydroxypropyl methylcellulose / hypromellose (ISOPTO TEARS / GONIOVISC) 2.5 % ophthalmic solution Place 2 drops into both eyes 3 (three) times daily. (Patient not taking: Reported on 12/23/2023)   No facility-administered encounter medications on file as of 12/23/2023.    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)    PMS (premenstrual syndrome)    Past Surgical History:  Procedure Laterality Date   BREAST BIOPSY Right    benign   CHOLECYSTECTOMY     COLONOSCOPY  11/08/2018   Danis-polyps   COLONOSCOPY  12/05/2019   Danis   DILATION AND CURETTAGE OF UTERUS     FOOT SURGERY Right 2018   removal of melanoma   LUMBAR FUSION     lymph node removal Right 2018   groin    PERIPHERAL VASCULAR THROMBECTOMY Left 11/08/2023   Procedure: PERIPHERAL VASCULAR THROMBECTOMY;  Surgeon: Marea Selinda RAMAN, MD;  Location: [facility] INVASIVE CV LAB;  Service: Cardiovascular;  Laterality: Left;   POLYPECTOMY     TOTAL ABDOMINAL HYSTERECTOMY W/ BILATERAL SALPINGOOPHORECTOMY     TUBAL LIGATION     Family History  Problem Relation Age of Onset   Depression Mother    Anxiety disorder Mother    Bipolar disorder Mother    Diabetes Mother    Mental illness Mother    Heart disease Mother    Stroke Mother    Lung cancer Father    Diabetes Maternal Grandmother    Heart attack Maternal Grandmother    CVA Paternal Grandmother    Colon cancer Neg Hx    Liver cancer Neg Hx    Pancreatic cancer Neg Hx    Esophageal cancer Neg Hx    Stomach cancer Neg Hx    Rectal cancer Neg Hx    Colon polyps Neg Hx    Breast cancer Neg Hx    Social History   Occupational History   Occupation: Retired  Tobacco Use   Smoking status: Never    Passive exposure: Never   Smokeless tobacco: Never  Vaping Use   Vaping status: Never Used  Substance and Sexual Activity   Alcohol use: Not Currently   Drug use: Never   Sexual activity: Not Currently   Tobacco Counseling Counseling given: Not Answered  SDOH Screenings   Food Insecurity: No Food Insecurity (12/23/2023)  Housing: Low Risk  (12/23/2023)  Transportation Needs: No Transportation Needs (12/23/2023)  Utilities: Not At Risk (12/23/2023)  Alcohol Screen: Low Risk  (09/29/2022)  Depression (PHQ2-9): Low Risk  (12/23/2023)  Recent Concern: Depression (PHQ2-9) -  Medium Risk (11/15/2023)  Financial Resource Strain: Low Risk  (10/24/2023)   Received from [facility]  Physical Activity: Sufficiently Active (12/23/2023)  Social Connections: Moderately Integrated (12/23/2023)  Stress: No Stress Concern Present (12/23/2023)  Tobacco Use: Low Risk  (12/23/2023)  Health Literacy: Adequate Health Literacy (12/23/2023)   See flowsheets for full screening details  Depression Screen PHQ 2 & 9 Depression Scale- Over the past 2 weeks, how often have you been bothered by any of the following problems? Little interest or pleasure in doing things: 0 Feeling down, depressed, or hopeless (PHQ  Adolescent also includes...irritable): 0 PHQ-2 Total Score: 0 Trouble falling or staying asleep, or sleeping too much: 0 Feeling tired or having little energy: 3 Poor appetite or overeating (PHQ Adolescent also includes...weight loss): 0 Feeling bad about yourself - or that you are a failure or have let yourself or your family down: 0 Trouble concentrating on things, such as reading the newspaper or watching television (PHQ Adolescent also includes...like school work): 0 Moving or speaking so slowly that other people could have noticed. Or the opposite - being so fidgety or restless that you have been moving around a lot more than usual: 0 Thoughts that you would be better off dead, or of hurting yourself in some way: 0 PHQ-9 Total Score: 3 If you checked off any problems, how difficult have these problems made it for you to do your work, take care of things at home, or get along with other people?: Somewhat difficult  Depression Treatment Depression Interventions/Treatment : EYV7-0 Score <4 Follow-up Not Indicated; Currently on Treatment     Goals Addressed               This Visit's Progress     Weight (lb) < 170 lb (77.1 kg) (pt-stated)   225 lb (102.1 kg)            Objective:    Today's Vitals   12/23/23 1514  Weight: 225 lb (102.1 kg)   Height: 5' 4.5 (1.638 m)   Body mass index is 38.02 kg/m.  Hearing/Vision screen Hearing Screening - Comments:: Denies hearing loss  Vision Screening - Comments:: UTD @ [facility] [place] per patient. Requested most recent eye exam (2025) Immunizations and Health Maintenance Health Maintenance  Topic Date Due   OPHTHALMOLOGY EXAM  04/19/2021   COVID-19 Vaccine (5 - 2025-26 season) 09/20/2023   Mammogram  10/12/2023   Colonoscopy  03/19/2024   Zoster Vaccines- Shingrix  (1 of 2) 01/09/2024 (Originally 07/30/1969)   DTaP/Tdap/Td (3 - Td or Tdap) 04/14/2024 (Originally 02/17/2023)   HEMOGLOBIN A1C  06/07/2024   FOOT EXAM  09/13/2024   Diabetic kidney evaluation - Urine ACR  10/12/2024   Diabetic kidney evaluation - eGFR measurement  12/08/2024   Medicare Annual Wellness (AWV)  12/22/2024   Bone Density Scan  10/03/2026   Pneumococcal Vaccine: 50+ Years  Completed   Influenza Vaccine  Completed   Hepatitis C Screening  Completed   Meningococcal B Vaccine  Aged Out        Assessment/Plan:  This is a routine wellness examination for Sarah [name].  Patient Care Team: Cannady, [name] T, NP as PCP - General (Nurse Practitioner) Argentina Clap, MD as PCP - Cardiology (Cardiology) [facility] (Optometry) Danis, Victory LITTIE MOULD, MD as Consulting Physician (Gastroenterology) Phebe, Janina Pates, MD as Referring Physician (Endocrinology) [name], NP as Nurse Practitioner (Behavioral Health) Delores Orvin BRAVO, NP as Nurse Practitioner (Vascular Surgery) Cammie Lacks, [place] (Neurology) Gust Molly, DO as Consulting Physician (Orthopedic Surgery)  I have personally reviewed and noted the following in the patient's chart:   Medical and social history Use of alcohol, tobacco or illicit drugs  Current medications and supplements including opioid prescriptions. Functional ability and status Nutritional status Physical activity Advanced directives List  of other physicians Hospitalizations, surgeries, and ER visits in previous 12 months Vitals Screenings to include cognitive, depression, and falls Referrals and appointments  Orders Placed This Encounter  Procedures   MM 3D SCREENING MAMMOGRAM BILATERAL BREAST    Standing Status:  Future    Expected Date:   12/24/2023    Expiration Date:   12/22/2024    Reason for Exam (SYMPTOM  OR DIAGNOSIS REQUIRED):   screening for breast cancer    Preferred imaging location?:   [facility]   In addition, I have reviewed and discussed with patient certain preventive protocols, quality metrics, and best practice recommendations. A written personalized care plan for preventive services as well as general preventive health recommendations were provided to patient.   Vina Ned, CMA   12/23/2023   Return in 1 year (on 12/26/2024) for Medicare Annual Wellness Visit.  After Visit Summary: (MyChart) Due to this being a telephonic visit, the after visit summary with patients personalized plan was offered to patient via MyChart   Nurse Notes:  FBS this morning per patient was 149 Placed order for MMG (was due ~10/12/23) Requested most recent DM eye exam (03/2023)  from [facility] in [place] Colon[facility] due ~ 03/19/24. Patient to call and schedule Needs Tdap and shingles vaccines (pharmacy) Declined Covid vaccine Declined DM & Nutrition education referral

## 2023-12-23 NOTE — Telephone Encounter (Signed)
 Requested medication (s) are due for refill today: routing for review  Requested medication (s) are on the active medication list: yes  Last refill:  03/17/22  Future visit scheduled: {Yes  Notes to clinic:  routing for review, Rx expired.     Requested Prescriptions  Pending Prescriptions Disp Refills   Blood Glucose Monitoring Suppl (ACCU-CHEK GUIDE) w/Device KIT [Pharmacy Med Name: ACCU-CHEK GUIDE MONITOR SYSTEM]      Sig: TO CHECK BLOOD SUGAR 2-3 TIMES DAILY AND DOCUMENT FOR VISITS.     Endocrinology: Diabetes - Testing Supplies Passed - 12/23/2023  9:58 AM      Passed - Valid encounter within last 12 months    Recent Outpatient Visits           2 weeks ago Type 2 diabetes mellitus in patient with obesity (HCC)   [place] [facility] [place], Melanie T, NP   1 month ago Acute deep vein thrombosis (DVT) of femoral vein of left lower extremity (HCC)   [place] [facility] [place], Melanie T, NP   2 months ago Gram-negative pneumonia (HCC)   [place] [facility] [place], Melanie T, NP   3 months ago Gram-negative pneumonia (HCC)   [place] [facility] [place], Melanie T, NP   8 months ago Type 2 diabetes mellitus with obesity   [place] [facility] [place], Melanie T, NP       Future Appointments             In 1 week Gust Molly, DO [facility]   In 1 month Argentina Clap, MD [place] [facility] Prescriptions Disp Refills   potassium chloride  (KLOR-CON  M) 10 MEQ tablet [Pharmacy Med Name: POTASSIUM CL ER 10 MEQ TAB MCR] 5 tablet 0    Sig: TAKE 1 TABLET BY MOUTH EVERY DAY FOR 5 DAYS     Endocrinology:  Minerals - Potassium Supplementation Failed - 12/23/2023  9:58 AM      Failed - Cr in normal range and within 360 days    Creat  Date Value Ref Range Status  11/25/2023 0.97 0.60 - 1.00 mg/dL Final   Creatinine, Ser  Date Value Ref  Range Status  12/09/2023 1.31 (H) 0.57 - 1.00 mg/dL Final   Creatinine, Urine  Date Value Ref Range Status  01/10/2018 14 (L) 20 - 275 mg/dL Final         Passed - K in normal range and within 360 days    Potassium  Date Value Ref Range Status  12/09/2023 4.6 3.5 - 5.2 mmol/L Final  06/04/2011 3.4 (L) 3.5 - 5.1 mmol/L Final         Passed - Valid encounter within last 12 months    Recent Outpatient Visits           2 weeks ago Type 2 diabetes mellitus in patient with obesity (HCC)   [place] [facility] [place], [place] T, NP   1 month ago Acute deep vein thrombosis (DVT) of femoral vein of left lower extremity (HCC)   [place] [facility] [place], Melanie T, NP   2 months ago Gram-negative pneumonia (HCC)   [place] [facility] [place], Melanie T, NP   3 months ago Gram-negative pneumonia [facility])    [facility] [place], [place] T, NP   8 months ago Type 2 diabetes  mellitus with obesity   [place] [facility] [place], Melanie DASEN, NP       Future Appointments             In 1 week Gust Molly, DO [place]   In 1 month Argentina Clap, MD [facility]

## 2023-12-23 NOTE — Patient Instructions (Signed)
 Sarah [name],  Thank you for taking the time for your Medicare Wellness Visit. I appreciate your continued commitment to your health goals. Please review the care plan we discussed, and feel free to reach out if I can assist you further.  Please note that Annual Wellness Visits do not include a physical exam. Some assessments may be limited, especially if the visit was conducted virtually. If needed, we may recommend an in-person follow-up with your provider.  Ongoing Care Seeing your primary care provider every 3 to 6 months helps us  monitor your health and provide consistent, personalized care.   Referrals If a referral was made during today's visit and you haven't received any updates within two weeks, please contact the referred provider directly to check on the status.  Recommended Screenings:  Your colonoscopy will be due around 03/19/24. Please call to schedule an appointment at your convenience.  You may get a tetanus and shingles vaccines at your local pharmacy at your convenience.  Please call to schedule your mammogram:  [facility] Address: [address], [place], [place] Phone: [phone]  [facility] [address], [address] [place], [place] [address] Phone: [phone]   Health Maintenance  Topic Date Due   Eye exam for diabetics  04/19/2021   COVID-19 Vaccine (5 - 2025-26 season) 09/20/2023   Medicare Annual Wellness Visit  09/29/2023   Breast Cancer Screening  10/12/2023   Colon Cancer Screening  03/19/2024   Zoster (Shingles) Vaccine (1 of 2) 01/09/2024*   DTaP/Tdap/Td vaccine (3 - Td or Tdap) 04/14/2024*   Hemoglobin A1C  06/07/2024   Complete foot exam   09/13/2024   Yearly kidney health urinalysis for diabetes  10/12/2024   Yearly kidney function blood test for diabetes  12/08/2024   Osteoporosis screening with Bone Density Scan  10/03/2026   Pneumococcal Vaccine for age over 58   Completed   Flu Shot  Completed   Hepatitis C Screening  Completed   Meningitis B Vaccine  Aged Out  *Topic was postponed. The date shown is not the original due date.       12/23/2023    3:21 PM  Advanced Directives  Does Patient Have a Medical Advance Directive? Yes  Type of Advance Directive Living will  Does patient want to make changes to medical advance directive? No - Patient declined  Would patient like information on creating a medical advance directive? No - Patient declined    Vision: Annual vision screenings are recommended for early detection of glaucoma, cataracts, and diabetic retinopathy. These exams can also reveal signs of chronic conditions such as diabetes and high blood pressure.  Dental: Annual dental screenings help detect early signs of oral cancer, gum disease, and other conditions linked to overall health, including heart disease and diabetes.  Please see the attached documents for additional preventive care recommendations.

## 2023-12-23 NOTE — Telephone Encounter (Signed)
 Potassium chloride  discontinued 11/28/23.  Requested Prescriptions  Pending Prescriptions Disp Refills   Blood Glucose Monitoring Suppl (ACCU-CHEK GUIDE) w/Device KIT [Pharmacy Med Name: ACCU-CHEK GUIDE MONITOR SYSTEM]      Sig: TO CHECK BLOOD SUGAR 2-3 TIMES DAILY AND DOCUMENT FOR VISITS.     Endocrinology: Diabetes - Testing Supplies Passed - 12/23/2023  9:57 AM      Passed - Valid encounter within last 12 months    Recent Outpatient Visits           2 weeks ago Type 2 diabetes mellitus in patient with obesity (HCC)   [place] [facility] [place], Melanie T, NP   1 month ago Acute deep vein thrombosis (DVT) of femoral vein of left lower extremity (HCC)   [place] [facility] [place], Melanie T, NP   2 months ago Gram-negative pneumonia (HCC)   [place] [facility] [place], Melanie T, NP   3 months ago Gram-negative pneumonia (HCC)   [place] [facility] [place], Melanie T, NP   8 months ago Type 2 diabetes mellitus with obesity   [place] [facility] [place], Melanie T, NP       Future Appointments             In 1 week Gust Molly, DO [facility] [place]   In 1 month Argentina Clap, MD [facility]             potassium chloride  (KLOR-CON  M) 10 MEQ tablet [Pharmacy Med Name: POTASSIUM CL ER 10 MEQ TAB MCR] 5 tablet 0    Sig: TAKE 1 TABLET BY MOUTH EVERY DAY FOR 5 DAYS     Endocrinology:  Minerals - Potassium Supplementation Failed - 12/23/2023  9:57 AM      Failed - Cr in normal range and within 360 days    Creat  Date Value Ref Range Status  11/25/2023 0.97 0.60 - 1.00 mg/dL Final   Creatinine, Ser  Date Value Ref Range Status  12/09/2023 1.31 (H) 0.57 - 1.00 mg/dL Final   Creatinine, Urine  Date Value Ref Range Status  01/10/2018 14 (L) 20 - 275 mg/dL Final         Passed - K in normal range and within 360 days    Potassium  Date Value Ref Range  Status  12/09/2023 4.6 3.5 - 5.2 mmol/L Final  06/04/2011 3.4 (L) 3.5 - 5.1 mmol/L Final         Passed - Valid encounter within last 12 months    Recent Outpatient Visits           2 weeks ago Type 2 diabetes mellitus in patient with obesity (HCC)   [place] [facility] [place], [place] T, NP   1 month ago Acute deep vein thrombosis (DVT) of femoral vein of left lower extremity (HCC)   [place] [facility] [place], Melanie T, NP   2 months ago Gram-negative pneumonia (HCC)   [place] [facility] [place], Melanie T, NP   3 months ago Gram-negative pneumonia (HCC)   [place] [facility] [place], [place] T, NP   8 months ago Type 2 diabetes mellitus with obesity    [facility] [place] Melanie DASEN, NP       Future Appointments             In 1 week Gust Molly, DO  [facility]  In 1 month Argentina Clap, MD [facility]

## 2024-01-02 DIAGNOSIS — I871 Compression of vein: Secondary | ICD-10-CM | POA: Insufficient documentation

## 2024-01-02 NOTE — Patient Instructions (Signed)

## 2024-01-03 ENCOUNTER — Ambulatory Visit

## 2024-01-04 ENCOUNTER — Ambulatory Visit: Admitting: Behavioral Health

## 2024-01-05 ENCOUNTER — Ambulatory Visit

## 2024-01-05 DIAGNOSIS — M75101 Unspecified rotator cuff tear or rupture of right shoulder, not specified as traumatic: Secondary | ICD-10-CM

## 2024-01-05 NOTE — Progress Notes (Signed)
 Orthopedic Follow-Up Note  6-week follow-up right shoulder pain and weakness    SUBJECTIVE:   Sarah [name] is a 73 y.o. year old  who presents for follow up of right shoulder pain and weakness.  She was last seen in office on 11/18/2023.  At that appointment, there was suspicion for right shoulder massive rotator cuff tear.  Given patient's recent hospitalization, she had opted for initial conservative management.  At that appointment a right shoulder subacromial injection was performed as well as a course of formal physical therapy.  She reports the injection helped with her symptoms of pain for a few weeks but since then her pain has returned.  She also states that physical therapy was very difficult and she was unable to achieve improvement in range of motion or strength with her right shoulder.  She states that she is at a point where she would like to consider more aggressive treatment options.  No new or worsening complaints today.     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)    PMS (premenstrual syndrome)    Past Surgical History:  Procedure Laterality Date   BREAST BIOPSY Right    benign   CHOLECYSTECTOMY     COLONOSCOPY  11/08/2018   Danis-polyps   COLONOSCOPY  12/05/2019   Danis   DILATION AND CURETTAGE OF UTERUS     FOOT SURGERY Right 2018   removal of melanoma   LUMBAR FUSION     lymph node removal Right  2018   groin    PERIPHERAL VASCULAR THROMBECTOMY Left 11/08/2023   Procedure: PERIPHERAL VASCULAR THROMBECTOMY;  Surgeon: Marea Selinda RAMAN, MD;  Location: [facility] INVASIVE CV LAB;  Service: Cardiovascular;  Laterality: Left;   POLYPECTOMY     TOTAL ABDOMINAL HYSTERECTOMY W/ BILATERAL SALPINGOOPHORECTOMY     TUBAL LIGATION     Current Medications[1] Allergies[2] Social History   Socioeconomic History   Marital status: Widowed    Spouse name: Not on file   Number of children: 1   Years of education: 15   Highest education level: High school graduate  Occupational History   Occupation: Retired  Tobacco Use   Smoking status: Never    Passive exposure: Never   Smokeless tobacco: Never  Vaping Use   Vaping status: Never Used  Substance and Sexual Activity   Alcohol use: Not Currently   Drug use: Never   Sexual activity: Not Currently  Other Topics Concern   Not on file  Social History Narrative   Daughter lives in [place].  Patient lives [address] in [place].    Social Drivers of Health   Tobacco Use: Low Risk (12/23/2023)   Patient History    Smoking Tobacco Use: Never    Smokeless Tobacco Use: Never  Passive Exposure: Never  Physicist, Medical Strain: Low Risk  (10/24/2023)   Received from [facility]   Overall Financial Resource Strain (CARDIA)    Difficulty of Paying Living Expenses: Not hard at all  Food Insecurity: No Food Insecurity (12/23/2023)   Epic    Worried About Programme Researcher, Broadcasting/film/video in the Last Year: Never true    Ran Out of Food in the Last Year: Never true  Transportation Needs: No Transportation Needs (12/23/2023)   Epic    Lack of Transportation (Medical): No    Lack of Transportation (Non-Medical): No  Physical Activity: Sufficiently Active (12/23/2023)   Exercise Vital Sign    Days of Exercise per Week: 3 days    Minutes of Exercise per Session: 60 min  Stress: No Stress Concern Present (12/23/2023)   [employer] Occupational [employer] - Occupational Stress Questionnaire    Feeling of Stress: Not at all  Social Connections: Moderately Integrated (12/23/2023)   Social Connection and Isolation Panel    Frequency of Communication with Friends and Family: More than three times a week    Frequency of Social Gatherings with Friends and Family: More than three times a week    Attends Religious Services: More than 4 times per year    Active Member of [employer] or Organizations: Yes    Attends Banker Meetings: More than 4 times per year    Marital Status: Widowed  Intimate Partner Violence: Not At Risk (12/23/2023)   Epic    Fear of Current or Ex-Partner: No    Emotionally Abused: No    Physically Abused: No    Sexually Abused: No  Depression (PHQ2-9): Low Risk (12/23/2023)   Depression (PHQ2-9)    PHQ-2 Score: 3  Recent Concern: Depression (PHQ2-9) - Medium Risk (11/15/2023)   Depression (PHQ2-9)    PHQ-2 Score: 5  Alcohol Screen: Low Risk (09/29/2022)   Alcohol Screen    Last Alcohol Screening Score (AUDIT): 0  Housing: Low Risk (12/23/2023)   Epic    Unable to Pay for Housing in the Last Year: No    Number of Times Moved in the Last Year: 0    Homeless in the Last Year: No  Utilities: Not At Risk (12/23/2023)   Epic    Threatened with loss of utilities: No  Health Literacy: Adequate Health Literacy (12/23/2023)   B1300 Health Literacy    Frequency of need for help with medical instructions: Never   Family History  Problem Relation Age of Onset   Depression Mother    Anxiety disorder Mother    Bipolar disorder Mother    Diabetes Mother    Mental illness Mother    Heart disease Mother    Stroke Mother    Lung cancer Father    Diabetes Maternal Grandmother    Heart attack Maternal Grandmother    CVA Paternal Grandmother    Colon cancer Neg Hx    Liver cancer Neg Hx    Pancreatic cancer Neg Hx    Esophageal cancer Neg Hx    Stomach cancer Neg Hx    Rectal cancer Neg  Hx    Colon polyps Neg Hx    Breast cancer Neg Hx      ROS: A review of systems was performed and is negative unless stated above in HPI    OBJECTIVE:    Constitutional:   The patient is alert and oriented x 3, appears to be stated age and in  no distress.   Orthopaedic Examination:  Shoulder focused exam:   Skin is intact about the right shoulder.  No obvious signs of trauma or deformity.  With forward flexion, abduction and external rotation of the arm at the side patient does demonstrate significant compensation with complementary musculature around the shoulder.       RIGHT LEFT  Scapula Atrophy Negative Negative    Winging Negative Negative  Rotator cuff Supraspinatus 4-/5 5/5    Infraspinatus 4-/5 5/5    Subscapularis 5/5 5/5  AROM/PROM (degrees) FF 0-50 / 0-170 0-170 / 0-170    ER0 0-10 / 0-45 0-60 / 0-60    IR(back) T6 / T6 T6 / T6  Palpation (pain): AC negative negative    Biceps positive negative    Coracoid negative negative  Special Tests: O'Briens positive negative    Mayo-shear Not performed Not performed    Cross body Adduction positive negative    Speeds  positive negative    Jobe's positive negative    Neer negative negative    Hawkins positive negative    Belly Press negative negative    Hornblower's positive negative  Other: Sulcus sign negative negative    Lateral deltoid 5/5 5/5      Vascular/Lymphatic: Fingers warm and well perfused with 2+ radial pulse.     Neurologic: Sensation intact to the Median, Ulnar and Radial nerve distribution of the hand. Sensation intact to lateral deltoid (axillary nerve).    IMAGING:   No new imaging available for review at today's encounter      ASSESSMENT:  Right shoulder pain and weakness- suspect significant rotator cuff tear     PLAN:   Patient was seen in office today for follow up of right shoulder pain and weakness.  At last encounter, patient had received subacromial injection as well as  engaged in formal physical therapy.  She did receive temporary relief with injection but has continued with weakness and limited range of motion in several planes.  She would like to consider more aggressive treatment options at this point as she believes that she cannot function given her current condition.  We discussed consideration of MRI of the right shoulder for further evaluation.  Based on her examination I suspect significant rotator cuff tear.  We also discussed the fact that I will be transitioning out of my role and unable to perform any anticipated surgery in the near future.  We discussed appropriate referral options with the patient and she would like a referral to Dr. Onesimo at [facility].  Patient will notify the office when her MRI right shoulder has been completed and we will put appropriate referral in for Dr. Onesimo.  She will notify the office with any issues in obtaining follow-up.  She also states that she does have anxiety with imaging like MRI.  She does have a follow-up on Friday with her primary care provider and I suggested that she discuss a medication for the morning of MRI.  Follow-up with Dr. Onesimo after MRI right shoulder has been performed for discussion of next steps  All questions and concerns were answered to the best of my ability.  Patient can call any time with further concerns.  I discussed with the patient today that I will be transitioning out of my role within the near future. In order to provide appropriate continuity of care, we offered the patient options for follow up regarding their orthopedic concerns. Patient has chosen to follow up with [facility] (  Dr. Onesimo).  Patient may reach out to our office if there are any difficulties in scheduling follow up care. The patient understands who to contact for future orthopedic concerns and has contact information for the receiving practice.   Arlyss Schneider, DO Orthopedic Surgery & Sports Medicine [facility]     [1]  Current Outpatient Medications:    acetaminophen  (TYLENOL ) 325 MG tablet, Take 650 mg by mouth every 4 (four) hours as needed., Disp: , Rfl:    albuterol  (PROVENTIL ) (2.5 MG/3ML) 0.083% nebulizer solution, Inhale 3 mLs (2.5 mg total) into the lungs every 4 (four) hours as needed for wheezing or shortness of breath., Disp: 75 mL, Rfl: 0   amitriptyline  (ELAVIL ) 50 MG tablet, Take 1 tablet (50 mg total) by mouth at bedtime., Disp: 30 tablet, Rfl: 0   amLODipine  (NORVASC ) 2.5 MG tablet, Take 1 tablet (2.5 mg total) by mouth daily., Disp: 30 tablet, Rfl: 0   apixaban  (ELIQUIS ) 5 MG TABS tablet, Take 1 tablet (5 mg total) by mouth 2 (two) times daily., Disp: 180 tablet, Rfl: 1   Blood Glucose Monitoring Suppl (ACCU-CHEK GUIDE) w/Device KIT, TO CHECK BLOOD SUGAR 2-3 TIMES DAILY AND DOCUMENT FOR VISITS., Disp: 1 kit, Rfl: 1   Cholecalciferol (VITAMIN D ) 50 MCG (2000 UT) CAPS, Take 1 capsule by mouth at bedtime., Disp: , Rfl:    Cyanocobalamin (VITAMIN B-12 PO), Take 1 tablet by mouth at bedtime., Disp: , Rfl:    Docosanol (ABREVA) 10 % CREA, Apply 1 Application topically 5 (five) times daily. Apply to lips for cold sores (Patient not taking: Reported on 12/23/2023), Disp: , Rfl:    EPINEPHrine  (EPIPEN  2-PAK) 0.3 mg/0.3 mL IJ SOAJ injection, Inject 0.3 mg into the muscle as needed for anaphylaxis., Disp: 2 each, Rfl: 0   escitalopram  (LEXAPRO ) 10 MG tablet, Take 10 mg by mouth daily., Disp: , Rfl:    FIBER ADULT GUMMIES PO, Take 1 tablet by mouth daily as needed., Disp: , Rfl:    furosemide  (LASIX ) 40 MG tablet, Take 1 tablet (40 mg total) by mouth daily., Disp: , Rfl:    hydroxypropyl methylcellulose / hypromellose (ISOPTO TEARS / GONIOVISC) 2.5 % ophthalmic solution, Place 2 drops into both eyes 3 (three) times daily. (Patient not taking: Reported on 12/23/2023), Disp: , Rfl:    insulin  glargine (LANTUS ) 100 UNIT/ML Solostar Pen, Inject 8 Units into the skin daily., Disp: 10 mL,  Rfl: 0   losartan  (COZAAR ) 50 MG tablet, Take 1 tablet (50 mg total) by mouth daily., Disp: 30 tablet, Rfl: 0   meclizine  (ANTIVERT ) 25 MG tablet, Take 1 tablet (25 mg total) by mouth 3 (three) times daily as needed for dizziness., Disp: 60 tablet, Rfl: 0   metFORMIN  (GLUCOPHAGE ) 1000 MG tablet, Take 1,000 mg by mouth 2 (two) times daily with a meal., Disp: , Rfl:    OneTouch UltraSoft 2 Lancets MISC, USE TO CHECK BLOOD SUGAR 3 TIMES A DAY AND DOCUMENT RESULTS, BRING TO APPOINTMENTS. GOAL IS <130 FASTING BLOOD SUGAR AND <180 TWO HOURS AFTER MEALS., Disp: 100 each, Rfl: 2   pantoprazole  (PROTONIX ) 40 MG tablet, Take 1 tablet (40 mg total) by mouth daily., Disp: 30 tablet, Rfl: 0   pregabalin  (LYRICA ) 150 MG capsule, Take 150 mg by mouth daily. For neuropathy pain see other listing (Patient taking differently: Take 150 mg by mouth daily. For neuropathy pain see other listing  Taking 1 in the morning and 1 in the afternoon), Disp: , Rfl:  pregabalin  (LYRICA ) 150 MG capsule, Take 300 mg by mouth at bedtime., Disp: , Rfl:    rosuvastatin  (CRESTOR ) 10 MG tablet, Take 1 tablet (10 mg total) by mouth daily., Disp: 30 tablet, Rfl: 0   Semaglutide ,0.25 or 0.5MG /DOS, (OZEMPIC , 0.25 OR 0.5 MG/DOSE,) 2 MG/3ML SOPN, Start with 0.25 MG injected into the skin once a week x 4 weeks, then on week 5 increase to 0.5 MG weekly injected into the skin., Disp: 3 mL, Rfl: 4   Suvorexant  (BELSOMRA ) 15 MG TABS, Take 1 tablet (15 mg total) by mouth at bedtime as needed., Disp: 30 tablet, Rfl: 0 [2]  Allergies Allergen Reactions   Clindamycin/Lincomycin Anaphylaxis   Doxycycline  Rash   Aspirin  Other (See Comments)    Pt is unsure of reaction, but states that she is allergic to aspirin    Ciprofloxacin Rash    very bad rash   Penicillins Rash    Has patient had a PCN reaction causing immediate rash, facial/tongue/throat swelling, SOB or lightheadedness with hypotension: No Has patient had a PCN reaction causing severe  rash involving mucus membranes or skin necrosis: No Has patient had a PCN reaction that required hospitalization: No Has patient had a PCN reaction occurring within the last 10 years: No If all of the above answers are NO, then may proceed with Cephalosporin use.

## 2024-01-07 ENCOUNTER — Ambulatory Visit (INDEPENDENT_AMBULATORY_CARE_PROVIDER_SITE_OTHER): Admitting: Nurse Practitioner

## 2024-01-07 ENCOUNTER — Encounter: Payer: Self-pay | Admitting: Nurse Practitioner

## 2024-01-07 VITALS — BP 152/79 | HR 82 | Temp 98.4°F | Wt 242.4 lb

## 2024-01-07 DIAGNOSIS — Z79899 Other long term (current) drug therapy: Secondary | ICD-10-CM

## 2024-01-07 DIAGNOSIS — I152 Hypertension secondary to endocrine disorders: Secondary | ICD-10-CM

## 2024-01-07 DIAGNOSIS — F40240 Claustrophobia: Secondary | ICD-10-CM | POA: Diagnosis not present

## 2024-01-07 DIAGNOSIS — E669 Obesity, unspecified: Secondary | ICD-10-CM

## 2024-01-07 DIAGNOSIS — E1159 Type 2 diabetes mellitus with other circulatory complications: Secondary | ICD-10-CM | POA: Diagnosis not present

## 2024-01-07 DIAGNOSIS — Z7985 Long-term (current) use of injectable non-insulin antidiabetic drugs: Secondary | ICD-10-CM | POA: Diagnosis not present

## 2024-01-07 DIAGNOSIS — E119 Type 2 diabetes mellitus without complications: Secondary | ICD-10-CM

## 2024-01-07 MED ORDER — OZEMPIC (0.25 OR 0.5 MG/DOSE) 2 MG/3ML ~~LOC~~ SOPN: 0.5000 mg | PEN_INJECTOR | SUBCUTANEOUS | 4 refills | Status: AC

## 2024-01-07 MED ORDER — ALPRAZOLAM 0.5 MG PO TABS: ORAL_TABLET | ORAL | 0 refills | Status: AC

## 2024-01-07 MED ORDER — LOSARTAN POTASSIUM 100 MG PO TABS: 100.0000 mg | ORAL_TABLET | Freq: Every day | ORAL | 3 refills | Status: AC

## 2024-01-07 NOTE — Assessment & Plan Note (Signed)
 Chronic, ongoing.  Continue current medication regimen, with exception of trial increase in Losartan  to 100 MG due to BP levels not being at goal consistently at home and not at goal in office today.  If this causes dizziness will go back to 50 MG.  Would avoid increase Amlodipine  if possible due to her baseline edema.  Recommend she monitor BP at least a few mornings a week at home and document.  DASH diet at home.  Refills as needed.  Losartan  for kidney protection.  LABS: up to date.  Continue to collaborate with cardiology.

## 2024-01-07 NOTE — Progress Notes (Signed)
 "  BP (!) 152/79 (BP Location: Left Arm, Patient Position: Sitting, Cuff Size: Normal)   Pulse 82   Temp 98.4 F (36.9 C) (Oral)   Wt 242 lb 6.4 oz (110 kg)   SpO2 94%   BMI 40.97 kg/m    Subjective:    Patient ID: Sarah [name], female    DOB: [birth year], 73 y.o.   MRN: [id number]  HPI: Sarah [name] is a 73 y.o. female  Chief Complaint  Patient presents with   Diabetes    Completed one month of ozempic    Daughter at bedside with patient.  DIABETES A1c 8.1% in November and was started on Ozempic . Just finished the 0.25 MG dosing. Continues on Metformin  1000 MG BID and Lantus  8 units. Tolerating Ozempic  without ADR. Hypoglycemic episodes:no Polydipsia/polyuria: no Visual disturbance: no Chest pain: no Paresthesias: no Glucose Monitoring: yes  Accucheck frequency: Daily  Fasting glucose: 132 to 232  Post prandial:  Evening:139 to 198  Before meals: Taking Insulin ?: no  Long acting insulin :  Short acting insulin : Blood Pressure Monitoring: daily Retinal Examination: Not up to Date -- [facility] Foot Exam: Up to Date Diabetic Education: Not Completed Pneumovax: Up to Date Influenza: Up to Date Aspirin : no   HYPERTENSION with Chronic Kidney Disease Taking Losartan , Lasix , and Amlodipine  for BP. Cardiology seen on 12/15/23, tried holding Lasix  but swelling to legs returned and she restarted. She reports the Lasix  helps lower the swelling. Hypertension status: stable  Satisfied with current treatment? yes Duration of hypertension: chronic BP monitoring frequency:  daily BP range: 128/65 to 163/89 -- HR 70 to 80 range BP medication side effects:  no Medication compliance: good compliance Aspirin : no Recurrent headaches: no Visual changes: no Palpitations: no Dyspnea: at baseline for quite awhile Chest pain: no Lower extremity edema: yes -- wearing compression helps Dizzy/lightheaded: yes , at baseline but more over past few weeks, sometimes lasting all day  long -- saw neurology last 02/02/23.  Yesterday felt like she was not in her body and felt like she would fall. Has been feeling weak all over. Has been to ortho, needs MRI right shoulder -- will need benzo for this due to nerves. Has tear to rotator cuff.  Relevant past medical, surgical, family and social history reviewed and updated as indicated. Interim medical history since our last visit reviewed. Allergies and medications reviewed and updated.  Review of Systems  Constitutional:  Positive for fatigue. Negative for activity change, appetite change, diaphoresis and fever.  Respiratory:  Positive for shortness of breath (at baseline). Negative for cough, chest tightness and wheezing.   Cardiovascular:  Positive for leg swelling (at baseline). Negative for chest pain and palpitations.  Gastrointestinal: Negative.   Endocrine: Negative for polydipsia, polyphagia and polyuria.  Neurological:  Positive for dizziness and weakness. Negative for facial asymmetry, speech difficulty, light-headedness and headaches.  Psychiatric/Behavioral: Negative.      Per HPI unless specifically indicated above     Objective:    BP (!) 152/79 (BP Location: Left Arm, Patient Position: Sitting, Cuff Size: Normal)   Pulse 82   Temp 98.4 F (36.9 C) (Oral)   Wt 242 lb 6.4 oz (110 kg)   SpO2 94%   BMI 40.97 kg/m   Wt Readings from Last 3 Encounters:  01/07/24 242 lb 6.4 oz (110 kg)  12/23/23 225 lb (102.1 kg)  12/20/23 233 lb 3.2 oz (105.8 kg)    Physical Exam Vitals and nursing note reviewed.  Constitutional:  General: She is awake. She is not in acute distress.    Appearance: She is well-developed and well-groomed. She is obese. She is not ill-appearing or toxic-appearing.  HENT:     Head: Normocephalic.     Right Ear: Hearing and external ear normal.     Left Ear: Hearing and external ear normal.  Eyes:     General: Lids are normal.        Right eye: No discharge.        Left eye: No  discharge.     Conjunctiva/sclera: Conjunctivae normal.     Pupils: Pupils are equal, round, and reactive to light.  Neck:     Thyroid : No thyromegaly.     Vascular: No carotid bruit.  Cardiovascular:     Rate and Rhythm: Normal rate and regular rhythm.     Heart sounds: Normal heart sounds. No murmur heard.    No gallop.     Comments: Compression hose on. Pulmonary:     Effort: Pulmonary effort is normal. No accessory muscle usage or respiratory distress.     Breath sounds: Normal breath sounds. No decreased breath sounds, wheezing or rales.  Abdominal:     General: Bowel sounds are normal. There is no distension.     Palpations: Abdomen is soft.     Tenderness: There is no abdominal tenderness.  Musculoskeletal:     Cervical back: Normal range of motion and neck supple.     Right lower leg: 1+ Edema present.     Left lower leg: 1+ Edema present.  Lymphadenopathy:     Cervical: No cervical adenopathy.  Skin:    General: Skin is warm and dry.  Neurological:     Mental Status: She is alert and oriented to person, place, and time.     Deep Tendon Reflexes: Reflexes are normal and symmetric.     Reflex Scores:      Brachioradialis reflexes are 2+ on the right side and 2+ on the left side.      Patellar reflexes are 2+ on the right side and 2+ on the left side. Psychiatric:        Attention and Perception: Attention normal.        Mood and Affect: Mood normal.        Speech: Speech normal.        Behavior: Behavior normal. Behavior is cooperative.        Thought Content: Thought content normal.    Results for orders placed or performed in visit on 12/09/23  Bayer DCA Hb A1c Waived   Collection Time: 12/09/23 10:42 AM  Result Value Ref Range   HB A1C (BAYER DCA - WAIVED) 8.1 (H) 4.8 - 5.6 %  Lipid Panel w/o Chol/HDL Ratio   Collection Time: 12/09/23 10:43 AM  Result Value Ref Range   Cholesterol, Total 148 100 - 199 mg/dL   Triglycerides 879 0 - 149 mg/dL   HDL 52 >60  mg/dL   VLDL Cholesterol Cal 21 5 - 40 mg/dL   LDL Chol Calc (NIH) 75 0 - 99 mg/dL  TSH   Collection Time: 12/09/23 10:43 AM  Result Value Ref Range   TSH 1.010 0.450 - 4.500 uIU/mL  Comprehensive metabolic panel with GFR   Collection Time: 12/09/23 10:43 AM  Result Value Ref Range   Glucose 185 (H) 70 - 99 mg/dL   BUN 19 8 - 27 mg/dL   Creatinine, Ser 8.68 (H) 0.57 - 1.00 mg/dL  eGFR 43 (L) >59 mL/min/1.73   BUN/Creatinine Ratio 15 12 - 28   Sodium 139 134 - 144 mmol/L   Potassium 4.6 3.5 - 5.2 mmol/L   Chloride 99 96 - 106 mmol/L   CO2 20 20 - 29 mmol/L   Calcium  9.7 8.7 - 10.3 mg/dL   Total Protein 6.4 6.0 - 8.5 g/dL   Albumin 4.3 3.8 - 4.8 g/dL   Globulin, Total 2.1 1.5 - 4.5 g/dL   Bilirubin Total 0.3 0.0 - 1.2 mg/dL   Alkaline Phosphatase 67 49 - 135 IU/L   AST 10 0 - 40 IU/L   ALT 16 0 - 32 IU/L  CBC with Differential/Platelet   Collection Time: 12/09/23 10:43 AM  Result Value Ref Range   WBC 10.0 3.4 - 10.8 x10E3/uL   RBC 4.15 3.77 - 5.28 x10E6/uL   Hemoglobin 11.4 11.1 - 15.9 g/dL   Hematocrit 62.8 65.9 - 46.6 %   MCV 89 79 - 97 fL   MCH 27.5 26.6 - 33.0 pg   MCHC 30.7 (L) 31.5 - 35.7 g/dL   RDW 85.8 88.2 - 84.5 %   Platelets 322 150 - 450 x10E3/uL   Neutrophils 75 Not Estab. %   Lymphs 15 Not Estab. %   Monocytes 6 Not Estab. %   Eos 2 Not Estab. %   Basos 1 Not Estab. %   Neutrophils Absolute 7.6 (H) 1.4 - 7.0 x10E3/uL   Lymphocytes Absolute 1.5 0.7 - 3.1 x10E3/uL   Monocytes Absolute 0.6 0.1 - 0.9 x10E3/uL   EOS (ABSOLUTE) 0.2 0.0 - 0.4 x10E3/uL   Basophils Absolute 0.1 0.0 - 0.2 x10E3/uL   Immature Granulocytes 1 Not Estab. %   Immature Grans (Abs) 0.1 0.0 - 0.1 x10E3/uL  Ferritin   Collection Time: 12/09/23 10:43 AM  Result Value Ref Range   Ferritin 47 15 - 150 ng/mL  Iron   Collection Time: 12/09/23 10:43 AM  Result Value Ref Range   Iron 68 27 - 139 ug/dL      Assessment & Plan:   Problem List Items Addressed This Visit        Cardiovascular and Mediastinum   Hypertension associated with diabetes (HCC)   Chronic, ongoing.  Continue current medication regimen, with exception of trial increase in Losartan  to 100 MG due to BP levels not being at goal consistently at home and not at goal in office today.  If this causes dizziness will go back to 50 MG.  Would avoid increase Amlodipine  if possible due to her baseline edema.  Recommend she monitor BP at least a few mornings a week at home and document.  DASH diet at home.  Refills as needed.  Losartan  for kidney protection.  LABS: up to date.  Continue to collaborate with cardiology.       Relevant Medications   losartan  (COZAAR ) 100 MG tablet   Semaglutide ,0.25 or 0.5MG /DOS, (OZEMPIC , 0.25 OR 0.5 MG/DOSE,) 2 MG/3ML SOPN     Endocrine   Type 2 diabetes mellitus in patient with obesity (HCC) - Primary   Chronic, ongoing with A1c 8.1% today which is trend up, urine ALB 80 September 2025  Taking Metformin  1000 MG BID and Lantus  8 units nightly, started in hospital.  Recommend check BS fasting every morning and document for visits.  Recommend heavy focus on diabetic diet at home and regular exercise. - Tolerating her Ozempic , recommend she start 0.5 MG dosing next week and educated her on this. No family history of  thyroid  cancer (MTC, MEN 2, thyroid  cell tumors) or pancreatitis.  Long term goal would be to discontinue insulin . - Eye exam up to date. Will get records. - Foot exam up to date - ARB and Statin on board - Pneumococcal and Flu vaccines up to date.      Relevant Medications   losartan  (COZAAR ) 100 MG tablet   Semaglutide ,0.25 or 0.5MG /DOS, (OZEMPIC , 0.25 OR 0.5 MG/DOSE,) 2 MG/3ML SOPN     Other   Polypharmacy   Discussed with her risks with multiple medications and advised her to talk to psychiatry about possibly adjusting some of her medications if possible as concern for this making her more fatigued. We discussed how medications can be challenging when a  patient has multiple chronic conditions to treat, but that goal is to ensure lowest, most effective dose in these scenarios.      Other Visit Diagnoses       Claustrophobia       Xanax sent in for MRI use only.   Relevant Medications   ALPRAZolam (XANAX) 0.5 MG tablet        Follow up plan: Return in about 9 weeks (around 03/10/2024) for T2DM, HTN/HLD, Depression, Addison's.      "

## 2024-01-07 NOTE — Assessment & Plan Note (Signed)
 Chronic, ongoing with A1c 8.1% today which is trend up, urine ALB 80 September 2025  Taking Metformin  1000 MG BID and Lantus  8 units nightly, started in hospital.  Recommend check BS fasting every morning and document for visits.  Recommend heavy focus on diabetic diet at home and regular exercise. - Tolerating her Ozempic , recommend she start 0.5 MG dosing next week and educated her on this. No family history of thyroid  cancer (MTC, MEN 2, thyroid  cell tumors) or pancreatitis.  Long term goal would be to discontinue insulin . - Eye exam up to date. Will get records. - Foot exam up to date - ARB and Statin on board - Pneumococcal and Flu vaccines up to date.

## 2024-01-07 NOTE — Assessment & Plan Note (Addendum)
 Discussed with her risks with multiple medications and advised her to talk to psychiatry about possibly adjusting some of her medications if possible as concern for this making her more fatigued. We discussed how medications can be challenging when a patient has multiple chronic conditions to treat, but that goal is to ensure lowest, most effective dose in these scenarios. Will place referral to [facility] pharmacist to assist with this and recommendations.

## 2024-01-11 ENCOUNTER — Other Ambulatory Visit (INDEPENDENT_AMBULATORY_CARE_PROVIDER_SITE_OTHER): Payer: Self-pay | Admitting: Nurse Practitioner

## 2024-01-11 ENCOUNTER — Telehealth: Payer: Self-pay

## 2024-01-11 DIAGNOSIS — Z86718 Personal history of other venous thrombosis and embolism: Secondary | ICD-10-CM

## 2024-01-11 DIAGNOSIS — I871 Compression of vein: Secondary | ICD-10-CM

## 2024-01-11 NOTE — Progress Notes (Signed)
 Care Guide Pharmacy Note  01/11/2024 Name: Sarah [name] MRN: [id number] DOB: 08/08/1950  Referred By: Valerio Melanie DASEN, NP Reason for referral: Complex Care Management (Outreach to schedule with Pharm d )   Sarah [name] is a 73 y.o. year old female who is a primary care patient of Cannady, [name] T, NP.  Sarah [name] was referred to the pharmacist for assistance related to: HTN and DMII  An unsuccessful telephone outreach was attempted today to contact the patient who was referred to the pharmacy team for assistance with medication management. Additional attempts will be made to contact the patient.  Jeoffrey Buffalo , RMA     [facility]  [facility] Guide  Direct Dial: [phone]  Website: [email or web address]

## 2024-01-18 ENCOUNTER — Ambulatory Visit (INDEPENDENT_AMBULATORY_CARE_PROVIDER_SITE_OTHER)

## 2024-01-18 ENCOUNTER — Encounter (INDEPENDENT_AMBULATORY_CARE_PROVIDER_SITE_OTHER): Payer: Self-pay | Admitting: Vascular Surgery

## 2024-01-18 ENCOUNTER — Ambulatory Visit (INDEPENDENT_AMBULATORY_CARE_PROVIDER_SITE_OTHER): Admitting: Vascular Surgery

## 2024-01-18 VITALS — BP 120/69 | HR 76 | Resp 17 | Ht 64.5 in | Wt 245.6 lb

## 2024-01-18 DIAGNOSIS — I871 Compression of vein: Secondary | ICD-10-CM

## 2024-01-18 DIAGNOSIS — E785 Hyperlipidemia, unspecified: Secondary | ICD-10-CM | POA: Diagnosis not present

## 2024-01-18 DIAGNOSIS — E119 Type 2 diabetes mellitus without complications: Secondary | ICD-10-CM | POA: Diagnosis not present

## 2024-01-18 DIAGNOSIS — I152 Hypertension secondary to endocrine disorders: Secondary | ICD-10-CM | POA: Diagnosis not present

## 2024-01-18 DIAGNOSIS — I82412 Acute embolism and thrombosis of left femoral vein: Secondary | ICD-10-CM | POA: Diagnosis not present

## 2024-01-18 DIAGNOSIS — E1169 Type 2 diabetes mellitus with other specified complication: Secondary | ICD-10-CM

## 2024-01-18 DIAGNOSIS — E669 Obesity, unspecified: Secondary | ICD-10-CM

## 2024-01-18 DIAGNOSIS — Z86718 Personal history of other venous thrombosis and embolism: Secondary | ICD-10-CM

## 2024-01-18 DIAGNOSIS — E1159 Type 2 diabetes mellitus with other circulatory complications: Secondary | ICD-10-CM | POA: Diagnosis not present

## 2024-01-18 NOTE — Assessment & Plan Note (Signed)
 blood pressure control important in reducing the progression of atherosclerotic disease. On appropriate oral medications.

## 2024-01-18 NOTE — Assessment & Plan Note (Signed)
 blood glucose control important in reducing the progression of atherosclerotic disease. Also, involved in wound healing. On appropriate medications.

## 2024-01-18 NOTE — Progress Notes (Signed)
 "   MRN : [id number]  Sarah [name] is a 73 y.o. (May 06, 1950) female who presents with chief complaint of  Chief Complaint  Patient presents with   Follow-up    Return in about 4 weeks (around 01/17/2024) for DVT   .  History of Present Illness: Patient returns today in follow up   Discussed the use of AI scribe software for clinical note transcription with the patient, who gave verbal consent to proceed.  History of Present Illness Sarah [name] is a 73 year old female with recent lower extremity deep vein thrombosis, s/p thrombectomy and iliac vein angioplasty who presents for vascular surgery follow-up after intervention.  She underwent treatment for lower extremity deep vein thrombosis several weeks ago, and her leg swelling resolved within a week with no current edema or leg symptoms.  She remains on anticoagulation without bleeding or other complications, and she has had no chest pain or dyspnea since the intervention.    Results Diagnostic Lower extremity venous ultrasound (12/2023): No deep vein thrombosis, normal venous flow  Current Outpatient Medications  Medication Sig Dispense Refill   acetaminophen  (TYLENOL ) 325 MG tablet Take 650 mg by mouth every 4 (four) hours as needed.     ALPRAZolam  (XANAX ) 0.5 MG tablet Take one tablet (0.5 MG) by mouth 30 minutes prior to procedure and if ongoing anxiety may repeat this 15 minutes prior to procedure (0.5 MG). 3 tablet 0   amitriptyline  (ELAVIL ) 50 MG tablet Take 1 tablet (50 mg total) by mouth at bedtime. 30 tablet 0   amLODipine  (NORVASC ) 2.5 MG tablet Take 1 tablet (2.5 mg total) by mouth daily. 30 tablet 0   apixaban  (ELIQUIS ) 5 MG TABS tablet Take 1 tablet (5 mg total) by mouth 2 (two) times daily. 180 tablet 1   Blood Glucose Monitoring Suppl (ACCU-CHEK GUIDE) w/Device KIT TO CHECK BLOOD SUGAR 2-3 TIMES DAILY AND DOCUMENT FOR VISITS. 1 kit 1   Cholecalciferol (VITAMIN D ) 50 MCG (2000 UT) CAPS Take 1 capsule by mouth at  bedtime.     Cyanocobalamin (VITAMIN B-12 PO) Take 1 tablet by mouth at bedtime.     EPINEPHrine  (EPIPEN  2-PAK) 0.3 mg/0.3 mL IJ SOAJ injection Inject 0.3 mg into the muscle as needed for anaphylaxis. 2 each 0   escitalopram  (LEXAPRO ) 10 MG tablet Take 10 mg by mouth daily.     FIBER ADULT GUMMIES PO Take 1 tablet by mouth daily as needed.     furosemide  (LASIX ) 40 MG tablet Take 1 tablet (40 mg total) by mouth daily.     insulin  glargine (LANTUS ) 100 UNIT/ML Solostar Pen Inject 8 Units into the skin daily. 10 mL 0   losartan  (COZAAR ) 100 MG tablet Take 1 tablet (100 mg total) by mouth daily. 90 tablet 3   meclizine  (ANTIVERT ) 25 MG tablet Take 1 tablet (25 mg total) by mouth 3 (three) times daily as needed for dizziness. 60 tablet 0   metFORMIN  (GLUCOPHAGE ) 1000 MG tablet Take 1,000 mg by mouth 2 (two) times daily with a meal.     OneTouch UltraSoft 2 Lancets MISC USE TO CHECK BLOOD SUGAR 3 TIMES A DAY AND DOCUMENT RESULTS, BRING TO APPOINTMENTS. GOAL IS <130 FASTING BLOOD SUGAR AND <180 TWO HOURS AFTER MEALS. 100 each 2   pantoprazole  (PROTONIX ) 40 MG tablet Take 1 tablet (40 mg total) by mouth daily. 30 tablet 0   pregabalin  (LYRICA ) 150 MG capsule Take 300 mg by mouth at bedtime.     rosuvastatin  (CRESTOR ) 10 MG  tablet Take 1 tablet (10 mg total) by mouth daily. 30 tablet 0   Semaglutide ,0.25 or 0.5MG /DOS, (OZEMPIC , 0.25 OR 0.5 MG/DOSE,) 2 MG/3ML SOPN Inject 0.5 mg into the skin once a week. 3 mL 4   Suvorexant  (BELSOMRA ) 15 MG TABS Take 1 tablet (15 mg total) by mouth at bedtime as needed. 30 tablet 0   albuterol  (PROVENTIL ) (2.5 MG/3ML) 0.083% nebulizer solution Inhale 3 mLs (2.5 mg total) into the lungs every 4 (four) hours as needed for wheezing or shortness of breath. (Patient not taking: Reported on 01/07/2024) 75 mL 0   Docosanol (ABREVA) 10 % CREA Apply 1 Application topically 5 (five) times daily. Apply to lips for cold sores (Patient not taking: Reported on 01/07/2024)      hydroxypropyl methylcellulose / hypromellose (ISOPTO TEARS / GONIOVISC) 2.5 % ophthalmic solution Place 2 drops into both eyes 3 (three) times daily. (Patient not taking: Reported on 01/18/2024)     No current facility-administered medications for this visit.    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)    PMS (premenstrual syndrome)     Past Surgical History:  Procedure Laterality Date   BREAST BIOPSY Right    benign   CHOLECYSTECTOMY     COLONOSCOPY  11/08/2018   Danis-polyps   COLONOSCOPY  12/05/2019   Danis   DILATION AND CURETTAGE OF UTERUS     FOOT SURGERY Right 2018   removal of melanoma   LUMBAR FUSION     lymph node removal Right 2018   groin    PERIPHERAL VASCULAR THROMBECTOMY Left 11/08/2023   Procedure: PERIPHERAL VASCULAR THROMBECTOMY;  Surgeon: Marea Selinda RAMAN, MD;  Location: [facility] IN[facility];  Service: Cardiovascular;  Laterality: Left;   POLYPECTOMY     TOTAL ABDOMINAL HYSTERECTOMY W/ BILATERAL SALPINGOOPHORECTOMY     TUBAL LIGATION       Social History[1]    Family History  Problem Relation Age of Onset   Depression Mother    Anxiety disorder Mother    Bipolar disorder Mother    Diabetes Mother    Mental illness Mother    Heart disease Mother    Stroke Mother    Lung cancer Father    Diabetes Maternal Grandmother    Heart attack Maternal Grandmother    CVA Paternal Grandmother     Colon cancer Neg Hx    Liver cancer Neg Hx    Pancreatic cancer Neg Hx    Esophageal cancer Neg Hx    Stomach cancer Neg Hx    Rectal cancer Neg Hx    Colon polyps Neg Hx    Breast cancer Neg Hx      Allergies[2]   REVIEW OF SYSTEMS (Negative unless checked)  Constitutional: [] Weight loss  [] Fever  [] Chills  Cardiac: [] Chest pain   [] Chest pressure   [] Palpitations   [] Shortness of breath when laying flat   [] Shortness of breath at rest   [] Shortness of breath with exertion. Vascular:  [] Pain in legs with walking   [] Pain in legs at rest   [] Pain in legs when laying flat   [] Claudication   [] Pain in feet when walking  [] Pain in feet at rest  [] Pain in feet when laying flat   [x] History of DVT   [x] Phlebitis   [x] Swelling in legs   [] Varicose veins   [] Non-healing ulcers Pulmonary:   [] Uses home oxygen   [] Productive cough   [] Hemoptysis   [] Wheeze  [] COPD   [] Asthma Neurologic:  [] Dizziness  [] Blackouts   [] Seizures   [] History of stroke   [] History of TIA  [] Aphasia   [] Temporary blindness   [] Dysphagia   [] Weakness or numbness in arms   [] Weakness or numbness in legs Musculoskeletal:  [] Arthritis   [] Joint swelling   [] Joint pain   [] Low back pain Hematologic:  [] Easy bruising  [] Easy bleeding   [] Hypercoagulable state   [] Anemic   Gastrointestinal:  [] Blood in stool   [] Vomiting blood  [] Gastroesophageal reflux/heartburn   [] Abdominal pain Genitourinary:  [] Chronic kidney disease   [] Difficult urination  [] Frequent urination  [] Burning with urination   [] Hematuria Skin:  [] Rashes   [] Ulcers   [] Wounds Psychological:  [x] History of anxiety   [x]  History of major depression.  Physical Examination  BP 120/69   Pulse 76   Resp 17   Ht 5' 4.5 (1.638 m)   Wt 245 lb 9.6 oz (111.4 kg)   BMI 41.51 kg/m  Gen:  WD/WN, NAD Head: [place]/AT, No temporalis wasting. Ear/Nose/Throat: Hearing grossly intact, nares w/o erythema or drainage Eyes: Conjunctiva clear. Sclera non-icteric Neck:  Supple.  Trachea midline Pulmonary:  Good air movement, no use of accessory muscles.  Cardiac: RRR, no JVD Vascular:  Vessel Right Left  Radial Palpable Palpable           Musculoskeletal: M/S 5/5 throughout.  No deformity or atrophy. No appreciable LE edema. Neurologic: Sensation grossly intact in extremities.  Symmetrical.  Speech is fluent.  Psychiatric: Judgment intact, Mood & affect appropriate for pt's clinical situation. Dermatologic: No rashes or ulcers noted.  No cellulitis or open wounds.  Physical Exam     Labs Recent Results (from the past 2160 hours)  D-dimer, quantitative     Status: Abnormal   Collection Time: 11/05/23  3:14 PM  Result Value Ref Range   D-Dimer, Quant 2.39 (H) 0.00 - 0.50 ug/mL-FEU    Comment: (NOTE) At the manufacturer cut-off value of 0.5 g/mL FEU, this assay has a negative predictive value of 95-100%.This assay is intended for use in conjunction with a clinical pretest probability (PTP) assessment model to exclude pulmonary embolism (PE) and deep venous thrombosis (DVT) in outpatients suspected of PE or DVT. Results should be correlated with clinical presentation. Performed at [facility], [address] [place], [place] [address]   Troponin I (High Sensitivity)     Status: None   Collection Time: 11/05/23  3:14 PM  Result Value Ref Range   Troponin I (High Sensitivity) 7 <18 ng/L    Comment: (NOTE) Elevated high sensitivity troponin I (hsTnI) values and significant  changes across serial measurements may suggest ACS but many other  chronic and acute conditions are known to elevate hsTnI results.  Refer to the Links section for chest pain algorithms and additional  guidance. Performed at [facility], [address] [place], [place] [address]   CBC     Status: Abnormal   Collection Time: 11/05/23  3:14 PM  Result Value Ref Range   WBC 13.4 (H) 4.0 - 10.5 K/uL   RBC 3.83 (L) 3.87 - 5.11 MIL/uL    Hemoglobin 11.0 (L) 12.0 - 15.0 g/dL   HCT 66.2 (L) 63.9 - 53.9 %   MCV 88.0 80.0 - 100.0 fL   MCH 28.7 26.0 - 34.0 pg   MCHC 32.6 30.0 - 36.0 g/dL   RDW 85.8 88.4 - 84.4 %   Platelets 186 150 - 400 K/uL   nRBC 0.0 0.0 - 0.2 %    Comment: Performed at [facility], [address] [place], [place], [place] [address]  Brain natriuretic peptide     Status: None   Collection Time: 11/05/23  3:14 PM  Result Value Ref Range   B Natriuretic Peptide 38.3 0.0 - 100.0 pg/mL    Comment: Performed at [facility], [address] [place], [place] [address]  Basic metabolic panel with GFR     Status: Abnormal   Collection Time: 11/05/23  3:14 PM  Result Value Ref Range   Sodium 135 135 - 145 mmol/L   Potassium 3.8 3.5 - 5.1 mmol/L   Chloride 99 98 - 111 mmol/L   CO2 24 22 - 32 mmol/L   Glucose, Bld 196 (H) 70 - 99 mg/dL    Comment: Glucose reference range applies only to samples taken after fasting for at least 8 hours.   BUN 19 8 - 23 mg/dL   Creatinine, Ser 9.05 0.44 - 1.00 mg/dL   Calcium  8.8 (L) 8.9 - 10.3 mg/dL   GFR, Estimated >39 >39 mL/min    Comment: (NOTE) Calculated using the CKD-EPI Creatinine Equation (2021)    Anion gap 12 5 - 15    Comment: Performed at [facility], [address], [place], [place] [address]  Troponin I (High Sensitivity)     Status: None   Collection Time: 11/05/23  4:04 PM  Result Value Ref Range   Troponin I (High Sensitivity) 6 <18 ng/L    Comment: (NOTE) Elevated high sensitivity troponin I (hsTnI) values and significant  changes across serial measurements may suggest ACS but many other  chronic and acute conditions are known to elevate hsTnI results.  Refer to the Links section for chest pain algorithms and additional  guidance. Performed at [facility], [address] [place], [place] [address]   APTT     Status: Abnormal   Collection Time: 11/05/23  7:23 PM  Result Value Ref Range   aPTT 23 (L) 24 - 36  seconds    Comment: Performed at [facility], [address] [place], [place] [address]  Protime-INR     Status: None   Collection Time: 11/05/23  7:23 PM  Result Value Ref Range   Prothrombin Time 13.9 11.4 - 15.2 seconds   INR 1.0 0.8 - 1.2    Comment: (NOTE) INR goal varies based on device and disease states. Performed at [facility], [address] [place], [place] [address]   Glucose, capillary     Status: Abnormal   Collection Time: 11/05/23  8:29 PM  Result Value Ref Range   Glucose-Capillary 182 (H) 70 - 99 mg/dL    Comment: Glucose reference range applies only to samples taken after fasting for at least 8 hours.  Culture,  blood (Routine X 2) w Reflex to ID Panel     Status: None   Collection Time: 11/05/23 11:28 PM   Specimen: BLOOD  Result Value Ref Range   Specimen Description BLOOD BLOOD RIGHT ARM    Special Requests      BOTTLES DRAWN AEROBIC AND ANAEROBIC Blood Culture adequate volume   Culture      NO GROWTH 5 DAYS Performed at [facility], [address] [place], [place] [address]    Report Status 11/11/2023 FINAL   Culture, blood (Routine X 2) w Reflex to ID Panel     Status: None   Collection Time: 11/05/23 11:30 PM   Specimen: BLOOD  Result Value Ref Range   Specimen Description BLOOD BLOOD LEFT HAND    Special Requests      BOTTLES DRAWN AEROBIC AND ANAEROBIC Blood Culture results may not be optimal due to an excessive volume of blood received in culture bottles   Culture      NO GROWTH 5 DAYS Performed at [facility], [address] [place], [place] [address]    Report Status 11/11/2023 FINAL   CBC     Status: Abnormal   Collection Time: 11/06/23  4:27 AM  Result Value Ref Range   WBC 10.3 4.0 - 10.5 K/uL   RBC 3.52 (L) 3.87 - 5.11 MIL/uL   Hemoglobin 10.1 (L) 12.0 - 15.0 g/dL   HCT 69.5 (L) 63.9 - 53.9 %   MCV 86.4 80.0 - 100.0 fL   MCH 28.7 26.0 - 34.0 pg   MCHC 33.2 30.0 - 36.0 g/dL   RDW 85.8  88.4 - 84.4 %   Platelets 183 150 - 400 K/uL   nRBC 0.0 0.0 - 0.2 %    Comment: Performed at [facility], [address] [place], [place] [address]  Basic metabolic panel     Status: Abnormal   Collection Time: 11/06/23  4:27 AM  Result Value Ref Range   Sodium 136 135 - 145 mmol/L   Potassium 3.8 3.5 - 5.1 mmol/L   Chloride 100 98 - 111 mmol/L   CO2 24 22 - 32 mmol/L   Glucose, Bld 274 (H) 70 - 99 mg/dL    Comment: Glucose reference range applies only to samples taken after fasting for at least 8 hours.   BUN 17 8 - 23 mg/dL   Creatinine, Ser 9.17 0.44 - 1.00 mg/dL   Calcium  8.5 (L) 8.9 - 10.3 mg/dL   GFR, Estimated >39 >39 mL/min    Comment: (NOTE) Calculated using the CKD-EPI Creatinine Equation (2021)    Anion gap 12 5 - 15    Comment: Performed at [facility], [address] [place], [place] [address]  Heparin  level (unfractionated)     Status: Abnormal   Collection Time: 11/06/23  4:27 AM  Result Value Ref Range   Heparin  Unfractionated 0.22 (L) 0.30 - 0.70 IU/mL    Comment: (NOTE) The clinical reportable range upper limit is being lowered to >1.10 to align with the FDA approved guidance for the current laboratory assay.  If heparin  results are below expected values, and patient dosage has  been confirmed, suggest follow up testing of antithrombin III levels. Performed at [facility], [address] [place], [place] [address]   Glucose, capillary     Status: Abnormal   Collection Time: 11/06/23  8:46 AM  Result Value Ref Range   Glucose-Capillary 227 (H) 70 - 99 mg/dL  Comment: Glucose reference range applies only to samples taken after fasting for at least 8 hours.   Comment 1 Notify RN    Comment 2 Document in Chart   Glucose, capillary     Status: Abnormal   Collection Time: 11/06/23 12:40 PM  Result Value Ref Range   Glucose-Capillary 299 (H) 70 - 99 mg/dL    Comment: Glucose reference range applies only to samples taken  after fasting for at least 8 hours.   Comment 1 Notify RN    Comment 2 Document in Chart   Heparin  level (unfractionated)     Status: None   Collection Time: 11/06/23  1:09 PM  Result Value Ref Range   Heparin  Unfractionated 0.30 0.30 - 0.70 IU/mL    Comment: (NOTE) The clinical reportable range upper limit is being lowered to >1.10 to align with the FDA approved guidance for the current laboratory assay.  If heparin  results are below expected values, and patient dosage has  been confirmed, suggest follow up testing of antithrombin III levels. Performed at [facility], [address] [place], [place] [address]   Glucose, capillary     Status: Abnormal   Collection Time: 11/06/23  5:25 PM  Result Value Ref Range   Glucose-Capillary 192 (H) 70 - 99 mg/dL    Comment: Glucose reference range applies only to samples taken after fasting for at least 8 hours.  Heparin  level (unfractionated)     Status: None   Collection Time: 11/06/23  9:37 PM  Result Value Ref Range   Heparin  Unfractionated 0.30 0.30 - 0.70 IU/mL    Comment: (NOTE) The clinical reportable range upper limit is being lowered to >1.10 to align with the FDA approved guidance for the current laboratory assay.  If heparin  results are below expected values, and patient dosage has  been confirmed, suggest follow up testing of antithrombin III levels. Performed at [facility], [address] [place], [place] [address]   Glucose, capillary     Status: Abnormal   Collection Time: 11/06/23 10:06 PM  Result Value Ref Range   Glucose-Capillary 243 (H) 70 - 99 mg/dL    Comment: Glucose reference range applies only to samples taken after fasting for at least 8 hours.  Heparin  level (unfractionated)     Status: None   Collection Time: 11/07/23  4:29 AM  Result Value Ref Range   Heparin  Unfractionated 0.36 0.30 - 0.70 IU/mL    Comment: (NOTE) The clinical reportable range upper limit is being lowered to  >1.10 to align with the FDA approved guidance for the current laboratory assay.  If heparin  results are below expected values, and patient dosage has  been confirmed, suggest follow up testing of antithrombin III levels. Performed at [facility], [address] [place], [place] [address]   CBC     Status: Abnormal   Collection Time: 11/07/23  4:29 AM  Result Value Ref Range   WBC 10.5 4.0 - 10.5 K/uL   RBC 3.45 (L) 3.87 - 5.11 MIL/uL   Hemoglobin 9.9 (L) 12.0 - 15.0 g/dL   HCT 69.4 (L) 63.9 - 53.9 %   MCV 88.4 80.0 - 100.0 fL   MCH 28.7 26.0 - 34.0 pg   MCHC 32.5 30.0 - 36.0 g/dL   RDW 85.9 88.4 - 84.4 %   Platelets 202 150 - 400 K/uL   nRBC 0.0 0.0 - 0.2 %    Comment: Performed at [facility], [address] [place], [place] [address]  Glucose, capillary     Status: Abnormal   Collection Time: 11/07/23  9:18 AM  Result Value Ref Range   Glucose-Capillary 249 (H) 70 - 99 mg/dL    Comment: Glucose reference range applies only to samples taken after fasting for at least 8 hours.   Comment 1 Notify RN    Comment 2 Document in Chart   Glucose, capillary     Status: Abnormal   Collection Time: 11/07/23 12:04 PM  Result Value Ref Range   Glucose-Capillary 263 (H) 70 - 99 mg/dL    Comment: Glucose reference range applies only to samples taken after fasting for at least 8 hours.   Comment 1 Notify RN   Glucose, capillary     Status: Abnormal   Collection Time: 11/07/23  4:09 PM  Result Value Ref Range   Glucose-Capillary 225 (H) 70 - 99 mg/dL    Comment: Glucose reference range applies only to samples taken after fasting for at least 8 hours.  Glucose, capillary     Status: Abnormal   Collection Time: 11/07/23  8:24 PM  Result Value Ref Range   Glucose-Capillary 206 (H) 70 - 99 mg/dL    Comment: Glucose reference range applies only to samples taken after fasting for at least 8 hours.  CBC     Status: Abnormal   Collection Time: 11/08/23  5:27 AM  Result  Value Ref Range   WBC 8.6 4.0 - 10.5 K/uL   RBC 3.37 (L) 3.87 - 5.11 MIL/uL   Hemoglobin 9.6 (L) 12.0 - 15.0 g/dL   HCT 70.4 (L) 63.9 - 53.9 %   MCV 87.5 80.0 - 100.0 fL   MCH 28.5 26.0 - 34.0 pg   MCHC 32.5 30.0 - 36.0 g/dL   RDW 86.1 88.4 - 84.4 %   Platelets 214 150 - 400 K/uL   nRBC 0.0 0.0 - 0.2 %    Comment: Performed at [facility], [address] [place], [place] [address]  Comprehensive metabolic panel     Status: Abnormal   Collection Time: 11/08/23  5:27 AM  Result Value Ref Range   Sodium 137 135 - 145 mmol/L   Potassium 3.8 3.5 - 5.1 mmol/L   Chloride 100 98 - 111 mmol/L   CO2 26 22 - 32 mmol/L   Glucose, Bld 300 (H) 70 - 99 mg/dL    Comment: Glucose reference range applies only to samples taken after fasting for at least 8 hours.   BUN 24 (H) 8 - 23 mg/dL   Creatinine, Ser 9.20 0.44 - 1.00 mg/dL   Calcium  8.5 (L) 8.9 - 10.3 mg/dL   Total Protein 5.9 (L) 6.5 - 8.1 g/dL   Albumin 2.6 (L) 3.5 - 5.0 g/dL   AST 13 (L) 15 - 41 U/L   ALT 12 0 - 44 U/L   Alkaline Phosphatase 49 38 - 126 U/L   Total Bilirubin 0.3 0.0 - 1.2 mg/dL   GFR, Estimated >39 >39 mL/min    Comment: (NOTE) Calculated using the CKD-EPI Creatinine Equation (2021)    Anion gap 11 5 - 15    Comment: Performed at [facility], [address] [place], [place] [address]  Heparin  level (unfractionated)     Status: None   Collection Time: 11/08/23  5:27 AM  Result Value Ref Range   Heparin  Unfractionated 0.45 0.30 - 0.70 IU/mL    Comment: (NOTE) The clinical reportable range upper limit is being  lowered to >1.10 to align with the FDA approved guidance for the current laboratory assay.  If heparin  results are below expected values, and patient dosage has  been confirmed, suggest follow up testing of antithrombin III levels. Performed at [facility], [address] [place], [place] [address]   Magnesium      Status: None   Collection Time: 11/08/23  5:27 AM  Result  Value Ref Range   Magnesium  1.9 1.7 - 2.4 mg/dL    Comment: Performed at [facility], [address] [place], [place] [address]  Glucose, capillary     Status: Abnormal   Collection Time: 11/08/23  7:13 AM  Result Value Ref Range   Glucose-Capillary 233 (H) 70 - 99 mg/dL    Comment: Glucose reference range applies only to samples taken after fasting for at least 8 hours.  Glucose, capillary     Status: Abnormal   Collection Time: 11/08/23  7:53 AM  Result Value Ref Range   Glucose-Capillary 241 (H) 70 - 99 mg/dL    Comment: Glucose reference range applies only to samples taken after fasting for at least 8 hours.  Glucose, capillary     Status: Abnormal   Collection Time: 11/08/23 10:17 AM  Result Value Ref Range   Glucose-Capillary 268 (H) 70 - 99 mg/dL    Comment: Glucose reference range applies only to samples taken after fasting for at least 8 hours.  Glucose, capillary     Status: Abnormal   Collection Time: 11/08/23 12:11 PM  Result Value Ref Range   Glucose-Capillary 224 (H) 70 - 99 mg/dL    Comment: Glucose reference range applies only to samples taken after fasting for at least 8 hours.   Comment 1 Notify RN    Comment 2 Document in Chart   Glucose, capillary     Status: Abnormal   Collection Time: 11/08/23  5:36 PM  Result Value Ref Range   Glucose-Capillary 285 (H) 70 - 99 mg/dL    Comment: Glucose reference range applies only to samples taken after fasting for at least 8 hours.  Glucose, capillary     Status: Abnormal   Collection Time: 11/08/23  8:58 PM  Result Value Ref Range   Glucose-Capillary 381 (H) 70 - 99 mg/dL    Comment: Glucose reference range applies only to samples taken after fasting for at least 8 hours.  Heparin  level (unfractionated)     Status: None   Collection Time: 11/09/23  4:17 AM  Result Value Ref Range   Heparin  Unfractionated 0.58 0.30 - 0.70 IU/mL    Comment: (NOTE) The clinical reportable range upper limit is being lowered to  >1.10 to align with the FDA approved guidance for the current laboratory assay.  If heparin  results are below expected values, and patient dosage has  been confirmed, suggest follow up testing of antithrombin III levels. Performed at [facility], [address] [place], [place] [address]   CBC     Status: Abnormal   Collection Time: 11/09/23  4:17 AM  Result Value Ref Range   WBC 8.9 4.0 - 10.5 K/uL   RBC 3.07 (L) 3.87 - 5.11 MIL/uL   Hemoglobin 8.8 (L) 12.0 - 15.0 g/dL   HCT 73.0 (L) 63.9 - 53.9 %   MCV 87.6 80.0 - 100.0 fL   MCH 28.7 26.0 - 34.0 pg   MCHC 32.7 30.0 - 36.0 g/dL   RDW 86.1 88.4 - 84.4 %   Platelets 232 150 -  400 K/uL   nRBC 0.4 (H) 0.0 - 0.2 %    Comment: Performed at [facility], [address] [place], [place] [address]  Glucose, capillary     Status: Abnormal   Collection Time: 11/09/23  8:27 AM  Result Value Ref Range   Glucose-Capillary 182 (H) 70 - 99 mg/dL    Comment: Glucose reference range applies only to samples taken after fasting for at least 8 hours.  Glucose, capillary     Status: Abnormal   Collection Time: 11/09/23 11:47 AM  Result Value Ref Range   Glucose-Capillary 362 (H) 70 - 99 mg/dL    Comment: Glucose reference range applies only to samples taken after fasting for at least 8 hours.   Comment 1 Notify RN    Comment 2 Document in Chart   Glucose, capillary     Status: Abnormal   Collection Time: 11/09/23  4:57 PM  Result Value Ref Range   Glucose-Capillary 148 (H) 70 - 99 mg/dL    Comment: Glucose reference range applies only to samples taken after fasting for at least 8 hours.   Comment 1 Notify RN    Comment 2 Document in Chart   Glucose, capillary     Status: Abnormal   Collection Time: 11/09/23  5:10 PM  Result Value Ref Range   Glucose-Capillary 140 (H) 70 - 99 mg/dL    Comment: Glucose reference range applies only to samples taken after fasting for at least 8 hours.  Glucose, capillary     Status:  Abnormal   Collection Time: 11/09/23  9:46 PM  Result Value Ref Range   Glucose-Capillary 155 (H) 70 - 99 mg/dL    Comment: Glucose reference range applies only to samples taken after fasting for at least 8 hours.  CBC     Status: Abnormal   Collection Time: 11/10/23  5:19 AM  Result Value Ref Range   WBC 10.2 4.0 - 10.5 K/uL   RBC 3.18 (L) 3.87 - 5.11 MIL/uL   Hemoglobin 9.0 (L) 12.0 - 15.0 g/dL   HCT 71.8 (L) 63.9 - 53.9 %   MCV 88.4 80.0 - 100.0 fL   MCH 28.3 26.0 - 34.0 pg   MCHC 32.0 30.0 - 36.0 g/dL   RDW 85.9 88.4 - 84.4 %   Platelets 240 150 - 400 K/uL   nRBC 0.6 (H) 0.0 - 0.2 %    Comment: Performed at [facility] [address] [place], [place] [address]  Glucose, capillary     Status: Abnormal   Collection Time: 11/10/23  8:09 AM  Result Value Ref Range   Glucose-Capillary 170 (H) 70 - 99 mg/dL    Comment: Glucose reference range applies only to samples taken after fasting for at least 8 hours.   Comment 1 Notify RN    Comment 2 Document in Chart   Glucose, capillary     Status: Abnormal   Collection Time: 11/10/23 11:31 AM  Result Value Ref Range   Glucose-Capillary 205 (H) 70 - 99 mg/dL    Comment: Glucose reference range applies only to samples taken after fasting for at least 8 hours.  Ferritin     Status: None   Collection Time: 11/15/23  2:50 PM  Result Value Ref Range   Ferritin 63 15 - 150 ng/mL  Iron     Status: None   Collection Time: 11/15/23  2:50 PM  Result Value Ref Range   Iron 51 27 - 139 ug/dL  Comprehensive metabolic panel with GFR     Status: Abnormal   Collection Time: 11/15/23  2:50 PM  Result Value Ref Range   Glucose 181 (H) 70 - 99 mg/dL   BUN 15 8 - 27 mg/dL   Creatinine, Ser 9.16 0.57 - 1.00 mg/dL   eGFR 75 >40 fO/fpw/8.26   BUN/Creatinine Ratio 18 12 - 28   Sodium 140 134 - 144 mmol/L   Potassium 3.4 (L) 3.5 - 5.2 mmol/L   Chloride 98 96 - 106 mmol/L   CO2 27 20 - 29 mmol/L   Calcium  8.8 8.7 - 10.3 mg/dL   Total  Protein 5.6 (L) 6.0 - 8.5 g/dL   Albumin 3.7 (L) 3.8 - 4.8 g/dL   Globulin, Total 1.9 1.5 - 4.5 g/dL   Bilirubin Total 0.3 0.0 - 1.2 mg/dL   Alkaline Phosphatase 63 49 - 135 IU/L   AST 8 0 - 40 IU/L   ALT 16 0 - 32 IU/L  CBC (STAT)     Status: Abnormal   Collection Time: 11/15/23  3:02 PM  Result Value Ref Range   WBC 10.4 3.4 - 10.8 x10E3/uL   RBC 3.48 (L) 3.77 - 5.28 x10E6/uL   Hemoglobin 10.5 (L) 11.1 - 15.9 g/dL   Hematocrit 69.9 (L) 65.9 - 46.6 %   MCV 86 79 - 97 fL   MCH 30.2 26.6 - 33.0 pg   MCHC 35.0 31.5 - 35.7 g/dL   RDW 84.9 88.2 - 84.5 %   Platelets 218 150 - 450 x10E3/uL  Brain Natriuretic Peptide     Status: Abnormal   Collection Time: 11/25/23  1:57 PM  Result Value Ref Range   Brain Natriuretic Peptide 155 (H) <100 pg/mL    Comment: . BNP levels increase with age in the general population with the highest values seen in individuals greater than 51 years of age. Reference: J. Am. Penne. Cardiol. 2002; 59:023-017. SABRA   CBC with Differential/Platelet     Status: Abnormal   Collection Time: 11/25/23  1:57 PM  Result Value Ref Range   WBC 15.5 (H) 3.8 - 10.8 Thousand/uL   RBC 3.48 (L) 3.80 - 5.10 Million/uL   Hemoglobin 9.8 (L) 11.7 - 15.5 g/dL   HCT 69.9 (L) 64.9 - 54.9 %   MCV 86.2 80.0 - 100.0 fL   MCH 28.2 27.0 - 33.0 pg   MCHC 32.7 32.0 - 36.0 g/dL    Comment: For adults, a slight decrease in the calculated MCHC value (in the range of 30 to 32 g/dL) is most likely not clinically significant; however, it should be interpreted with caution in correlation with other red cell parameters and the patient's clinical condition.    RDW 13.5 11.0 - 15.0 %   Platelets 343 140 - 400 Thousand/uL   MPV 11.1 7.5 - 12.5 fL   Neutro Abs 13,919 (H) 1,500 - 7,800 cells/uL   Absolute Lymphocytes 729 (L) 850 - 3,900 cells/uL   Absolute Monocytes 636 200 - 950 cells/uL   Eosinophils Absolute 186 15 - 500 cells/uL   Basophils Absolute 31 0 - 200 cells/uL   Neutrophils  Relative % 89.8 %   Total Lymphocyte 4.7 %   Monocytes Relative 4.1 %   Eosinophils Relative 1.2 %   Basophils Relative 0.2 %  CMP     Status: Abnormal   Collection Time: 11/25/23  1:57 PM  Result Value Ref Range   Glucose, Bld 221 (H) 65 - 99 mg/dL  Comment: .            Fasting reference interval . For someone without known diabetes, a glucose value >125 mg/dL indicates that they may have diabetes and this should be confirmed with a follow-up test. .    BUN 16 7 - 25 mg/dL   Creat 9.02 9.39 - 8.99 mg/dL   eGFR 62 > OR = 60 fO/fpw/8.26f7   BUN/Creatinine Ratio SEE NOTE: 6 - 22 (calc)    Comment:    Not Reported: BUN and Creatinine are within    reference range. .    Sodium 140 135 - 146 mmol/L   Potassium 3.7 3.5 - 5.3 mmol/L   Chloride 99 98 - 110 mmol/L   CO2 26 20 - 32 mmol/L   Calcium  8.8 8.6 - 10.4 mg/dL   Total Protein 5.8 (L) 6.1 - 8.1 g/dL   Albumin 3.7 3.6 - 5.1 g/dL   Globulin 2.1 1.9 - 3.7 g/dL (calc)   AG Ratio 1.8 1.0 - 2.5 (calc)   Total Bilirubin 0.6 0.2 - 1.2 mg/dL   Alkaline phosphatase (APISO) 63 37 - 153 U/L   AST 6 (L) 10 - 35 U/L   ALT 10 6 - 29 U/L  Cortisol, free, Serum     Status: None   Collection Time: 11/25/23  1:57 PM  Result Value Ref Range   Cortisol Free, Ser 0.56 mcg/dL    Comment: . Adult Reference Ranges for Cortisol, MS, Free: .      8:00 - 10:00 AM      0.07-0.93 mcg/dL      5:99 -  3:99 PM      0.04-0.45 mcg/dL     10:00 - 11:00 PM      0.04-0.35 mcg/dL . This test was developed and its analytical performance characteristics have been determined by [employer]. It has not been cleared or approved by the FDA. This assay has been validated pursuant to the CLIA regulations and is used for clinical purposes.   CBC and differential     Status: Abnormal   Collection Time: 11/29/23 12:00 AM  Result Value Ref Range   Hemoglobin 10.7 (A) 12.0 - 16.0   HCT 33 (A) 36 - 46   Neutrophils Absolute 4,752.00    Platelets 355  150 - 400 K/uL   WBC 7.2   CBC     Status: Abnormal   Collection Time: 11/29/23 12:00 AM  Result Value Ref Range   RBC 3.81 (A) 3.87 - 5.11  Basic metabolic panel with GFR     Status: Abnormal   Collection Time: 11/29/23 12:00 AM  Result Value Ref Range   Glucose 187    BUN 18 4 - 21   CO2 34 (A) 13 - 22   Creatinine 1.0 0.5 - 1.1   Potassium 3.1 (A) 3.5 - 5.1 mEq/L   Sodium 142 137 - 147   Chloride 97 (A) 99 - 108  Comprehensive metabolic panel with GFR     Status: Abnormal   Collection Time: 11/29/23 12:00 AM  Result Value Ref Range   eGFR 63    Calcium  8.1 (A) 8.7 - 10.7  CBC with Differential     Status: Abnormal   Collection Time: 12/02/23 11:31 AM  Result Value Ref Range   WBC 14.9 (H) 4.0 - 10.5 K/uL   RBC 4.17 3.87 - 5.11 MIL/uL   Hemoglobin 11.5 (L) 12.0 - 15.0 g/dL   HCT 63.5 63.9 - 53.9 %  MCV 87.3 80.0 - 100.0 fL   MCH 27.6 26.0 - 34.0 pg   MCHC 31.6 30.0 - 36.0 g/dL   RDW 85.7 88.4 - 84.4 %   Platelets 384 150 - 400 K/uL   nRBC 0.1 0.0 - 0.2 %   Neutrophils Relative % 82 %   Neutro Abs 12.3 (H) 1.7 - 7.7 K/uL   Lymphocytes Relative 9 %   Lymphs Abs 1.4 0.7 - 4.0 K/uL   Monocytes Relative 4 %   Monocytes Absolute 0.7 0.1 - 1.0 K/uL   Eosinophils Relative 1 %   Eosinophils Absolute 0.1 0.0 - 0.5 K/uL   Basophils Relative 1 %   Basophils Absolute 0.1 0.0 - 0.1 K/uL   Immature Granulocytes 3 %   Abs Immature Granulocytes 0.38 (H) 0.00 - 0.07 K/uL    Comment: Performed at [facility], [address], [place], [place] [address]  Basic metabolic panel     Status: Abnormal   Collection Time: 12/02/23 11:31 AM  Result Value Ref Range   Sodium 143 135 - 145 mmol/L   Potassium 3.3 (L) 3.5 - 5.1 mmol/L   Chloride 99 98 - 111 mmol/L   CO2 31 22 - 32 mmol/L   Glucose, Bld 254 (H) 70 - 99 mg/dL    Comment: Glucose reference range applies only to samples taken after fasting for at least 8 hours.   BUN 14 8 - 23 mg/dL   Creatinine, Ser 9.13 0.44 - 1.00  mg/dL   Calcium  8.8 (L) 8.9 - 10.3 mg/dL   GFR, Estimated >39 >39 mL/min    Comment: (NOTE) Calculated using the CKD-EPI Creatinine Equation (2021)    Anion gap 12 5 - 15    Comment: Performed at [facility], [address] [place], [place] [address]  [facility] Hb A1c Waived     Status: Abnormal   Collection Time: 12/09/23 10:42 AM  Result Value Ref Range   HB A1C (BAYER DCA - WAIVED) 8.1 (H) 4.8 - 5.6 %    Comment:          Prediabetes: 5.7 - 6.4          Diabetes: >6.4          Glycemic control for adults with diabetes: <7.0   Lipid Panel w/o Chol/HDL Ratio     Status: None   Collection Time: 12/09/23 10:43 AM  Result Value Ref Range   Cholesterol, Total 148 100 - 199 mg/dL   Triglycerides 879 0 - 149 mg/dL   HDL 52 >60 mg/dL   VLDL Cholesterol Cal 21 5 - 40 mg/dL   LDL Chol Calc (NIH) 75 0 - 99 mg/dL  TSH     Status: None   Collection Time: 12/09/23 10:43 AM  Result Value Ref Range   TSH 1.010 0.450 - 4.500 uIU/mL  Comprehensive metabolic panel with GFR     Status: Abnormal   Collection Time: 12/09/23 10:43 AM  Result Value Ref Range   Glucose 185 (H) 70 - 99 mg/dL   BUN 19 8 - 27 mg/dL   Creatinine, Ser 8.68 (H) 0.57 - 1.00 mg/dL   eGFR 43 (L) >40 fO/fpw/8.26   BUN/Creatinine Ratio 15 12 - 28   Sodium 139 134 - 144 mmol/L   Potassium 4.6 3.5 - 5.2 mmol/L   Chloride 99 96 - 106 mmol/L   CO2 20 20 - 29 mmol/L   Calcium  9.7 8.7 - 10.3 mg/dL   Total Protein  6.4 6.0 - 8.5 g/dL   Albumin 4.3 3.8 - 4.8 g/dL   Globulin, Total 2.1 1.5 - 4.5 g/dL   Bilirubin Total 0.3 0.0 - 1.2 mg/dL   Alkaline Phosphatase 67 49 - 135 IU/L   AST 10 0 - 40 IU/L   ALT 16 0 - 32 IU/L  CBC with Differential/Platelet     Status: Abnormal   Collection Time: 12/09/23 10:43 AM  Result Value Ref Range   WBC 10.0 3.4 - 10.8 x10E3/uL   RBC 4.15 3.77 - 5.28 x10E6/uL   Hemoglobin 11.4 11.1 - 15.9 g/dL   Hematocrit 62.8 65.9 - 46.6 %   MCV 89 79 - 97 fL   MCH 27.5 26.6 - 33.0 pg    MCHC 30.7 (L) 31.5 - 35.7 g/dL   RDW 85.8 88.2 - 84.5 %   Platelets 322 150 - 450 x10E3/uL   Neutrophils 75 Not Estab. %   Lymphs 15 Not Estab. %   Monocytes 6 Not Estab. %   Eos 2 Not Estab. %   Basos 1 Not Estab. %   Neutrophils Absolute 7.6 (H) 1.4 - 7.0 x10E3/uL   Lymphocytes Absolute 1.5 0.7 - 3.1 x10E3/uL   Monocytes Absolute 0.6 0.1 - 0.9 x10E3/uL   EOS (ABSOLUTE) 0.2 0.0 - 0.4 x10E3/uL   Basophils Absolute 0.1 0.0 - 0.2 x10E3/uL   Immature Granulocytes 1 Not Estab. %   Immature Grans (Abs) 0.1 0.0 - 0.1 x10E3/uL  Ferritin     Status: None   Collection Time: 12/09/23 10:43 AM  Result Value Ref Range   Ferritin 47 15 - 150 ng/mL  Iron     Status: None   Collection Time: 12/09/23 10:43 AM  Result Value Ref Range   Iron 68 27 - 139 ug/dL    Radiology No results found.  Assessment/Plan Assessment & Plan Deep vein thrombosis of lower extremity Status post peripheral vascular thrombectomy with complete symptom resolution and no recurrent thrombosis on ultrasound. Anticoagulation well-tolerated. - Continue anticoagulation therapy for at least six months. - Follow-up in six months to reassess anticoagulation strategy. - Discuss future anticoagulation options: continuation, transition to aspirin , or dose reduction for indefinite use.  Hypertension associated with diabetes (HCC) blood pressure control important in reducing the progression of atherosclerotic disease. On appropriate oral medications.   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  Doing well with marked symptom improvement.  Hyperlipidemia associated with type 2 diabetes mellitus (HCC) lipid control important in reducing the progression of atherosclerotic disease. Continue statin therapy   Type 2 diabetes mellitus in patient with obesity (HCC) blood glucose control important in reducing the progression of atherosclerotic disease. Also, involved in wound healing. On  appropriate medications.   Selinda Gu, MD  01/18/2024 4:08 PM    This note was created with Dragon medical transcription system.  Any errors from dictation are purely unintentional     [1]  Social History Tobacco Use   Smoking status: Never    Passive exposure: Never   Smokeless tobacco: Never  Vaping Use   Vaping status: Never Used  Substance Use Topics   Alcohol use: Not Currently   Drug use: Never  [2]  Allergies Allergen Reactions   Clindamycin/Lincomycin Anaphylaxis   Doxycycline  Rash   Aspirin  Other (See Comments)    Pt is unsure of reaction, but states that she is allergic to aspirin    Ciprofloxacin Rash    very bad rash   Penicillins Rash    Has  patient had a PCN reaction causing immediate rash, facial/tongue/throat swelling, SOB or lightheadedness with hypotension: No Has patient had a PCN reaction causing severe rash involving mucus membranes or skin necrosis: No Has patient had a PCN reaction that required hospitalization: No Has patient had a PCN reaction occurring within the last 10 years: No If all of the above answers are NO, then may proceed with Cephalosporin use.    "

## 2024-01-18 NOTE — Assessment & Plan Note (Signed)
 lipid control important in reducing the progression of atherosclerotic disease. Continue statin therapy

## 2024-01-18 NOTE — Assessment & Plan Note (Signed)
 Status post venous thrombectomy with iliac angioplasty.  Doing well with marked symptom improvement.

## 2024-01-19 ENCOUNTER — Telehealth: Payer: Self-pay | Admitting: *Deleted

## 2024-01-19 NOTE — Telephone Encounter (Signed)
 Called and spoke with the patient about her monitor to get clarification. The patient was seen in the office on 12/15/23 and was ordered a zio monitor. The monitor was ordered for home delivery but the patient was also given one in the clinic. The patient did where the one that was given in the clinic and has returned it. She also received a second monitor in the mail. She has been advised to please return that one to [employer] in the post stamped box and that she does not need to wear that one.

## 2024-01-19 NOTE — Progress Notes (Signed)
 HPI The patient is a pleasant 73 year old right hand dominate female who presents today for left lateral neck pain with pain in the left shoulder to the tricep to the elbow. Her pain is moderate to severe that began in early December 2025 without cause. Her rest is interrupted and she has been taking tylenol  with some benefit.  No difficulty with fine motor task.  She states that in the past when this pain flare she received an epidural steroid injection and her pain alleviates.  She is hopeful to schedule for another injection today.  She is planning to have rotator cuff surgery on the right shoulder in the near future.  She was hospitalized in August 25 for sepsis and diagnosed with DVT in October 25.  She is now on Eliquis .  In the past she was evaluated for acute on chronic bilateral low back pain without radiation. Her pain began in fall 2024 when she was walking up an incline. She is able to sleep without pain but does use a heating pad. Her pain is moderate to severe throbbing and aching that is constant with associated with numbness. Her pain is unchanged since onset. She has a history of lumbar spine fusion in early 2000 and she did well with this.  She reports that her pain is worse with standing, walking, bending, lifting, twisting, squatting and stairs.  Sitting along with lying in bed and heat and ice can help to alleviate her pain.  She denies any loss control of bowel or bladder or saddle anesthesia  In 2021 she was evaluated for acute on chronic left lateral neck pain with radiation the shoulder and tricep to the elbow. Her pain began in 2017 after falling down 17 stairs at this time she was in a wheelchair for approximately a year.   Periodic injections have provided good relief of her pain.  Medications have included tylenol  (moderate relief), Lyrica  (mild relief), Norco 5/325 1 tablet every 8 hours (mild to moderate relief).  She recently moved to [facility].  She has a daughter in  [place].  She was initially evaluated on 04/22/2023 at which time she was experiencing acute on chronic bilateral low back pain without radiation.  She was to continue with occasional use of Tylenol  and lumbar spine flexion-extension x-rays were obtained she was referred stat to Dr. Colon.  The [place]  Narcotic Database was reviewed today.   Procedures: 08/01/2021: Left C4-5 transforaminal ESI (75% relief) 08/16/2018: Left C4-5 transforaminal ESI (good relief) 05/25/2018: Left C4-5 transforaminal ESI (mild relief) 01/06/2018: Left C4-5 transforaminal ESI (good relief) 11/04/2017: Left C4-5 transforaminal ESI (no relief) 05/20/2016: Left C4-5 transforaminal ESI (moderate to good relief) 11/27/2015: Left C4-5 transforaminal ESI (good relief) 08/14/2015: Left sacroiliac joint injection 05/01/2015: Left C4-5 transforaminal ESI  Past Medical History:  Diagnosis Date   Acute pain of left shoulder    Acute pain of right knee    Cervical pain    Depression    Dysesthesia    History of cancer    HTN (hypertension)    Hyperlipidemia    Impingement syndrome, shoulder    Lateral epicondylitis    Lumbar pain    Pain of left scapula    PONV (postoperative nausea and vomiting)    Radiculitis of left cervical region    Spondylosis of cervical region without myelopathy or radiculopathy    Trigger finger     Past Surgical History:  Procedure Laterality Date   CHOLECYSTECTOMY  1996   h/o hysterectomy  2004   back surgery  2005   OOPHORECTOMY  2005   PR EXCISN LESN MLG NK/EXT 2.1-3 Right 12/04/2016   Procedure: EXCISION, MALIGNANT LESION INCLUDING MARGINS, SCALP/NECK/HAND/FOOT/GENITALIA; EXCISED DIAMETER 3.1 TO 4.0 CM;  Surgeon: Warden Deward Pac, MD;  Location: [facility] OR;  Service: General Surgery;  Laterality: Right;  Note:  Actually > 4 cm diameter but could not find code    Social History   Socioeconomic History   Marital status: Widowed    Number of children: 1  Tobacco Use   Smoking status: Never   Smokeless tobacco: Never  Vaping Use   Vaping status: Never Used  Substance and Sexual Activity   Alcohol use: Not Currently    Alcohol/week: 1.0 standard drink of alcohol    Types: 1 Glasses of wine per week    Comment: Occ only    Drug use: No   Sexual activity: Not Currently    Birth control/protection: None  Social History Narrative   She has lived in [place], [place] since 2005. She lives alone in a [address] She has family living nearby.   Social Drivers of Corporate Investment Banker Strain: Low Risk  (10/24/2023)   Overall Financial Resource Strain (CARDIA)    Difficulty of Paying Living Expenses: Not hard at all  Food Insecurity: No Food Insecurity (12/23/2023)   Received from [facility]   Hunger Vital Sign    Within the past 12 months, you worried that your food would run out before you got the money to buy more.: Never true    Within the past 12 months, the food you bought just didn't last and you didn't have money to get more.: Never true  Transportation Needs: No Transportation Needs (12/23/2023)   Received from [facility] - Transportation    In the past 12 months, has lack of transportation kept you from medical appointments or from getting medications?: No    In the past 12 months, has lack of transportation kept you from meetings, work, or from getting things needed for daily living?: No    Family History  Problem Relation Name Age of Onset   Heart failure Mother Manford Roulette    Depression Mother Manford Roulette    Diabetes Mother Manford Roulette    Heart disease Mother Manford Roulette    Stroke Mother Manford Roulette    Cancer Father Larnell Roulette        lung   COPD Sister Nathanel Kiang        lung nodules   High blood pressure (Hypertension) Sister Nathanel Kiang    Heart disease Paternal Grandfather     Heart failure Maternal Grandmother Ronal Sharps    Diabetes Maternal  Grandmother Ronal Sharps    Heart disease Maternal Grandfather     Stroke Paternal Grandmother Sherrilyn Roulette    Rheum arthritis Paternal Grandmother Sherrilyn Roulette    Uterine cancer Daughter [name] George        also has had colon removed   Migraines Daughter [name] George    Nephrolithiasis Daughter [name] George    Adrenal disorder Daughter [name] George    Multiple sclerosis Daughter [name] George    Lupus Brother     Skin cancer Brother         non melanoma    Current Outpatient Medications on File Prior to Visit  Medication Sig Dispense Refill   acetaminophen  (TYLENOL ) 500 MG tablet Take 1,000 mg by mouth every 8 (eight)  hours as needed for Pain     amitriptyline  (ELAVIL ) 50 MG tablet Take 1 tablet (50 mg total) by mouth at bedtime 90 tablet 3   apixaban  (ELIQUIS ) 5 mg tablet Take 5 mg by mouth 2 (two) times daily     BELSOMRA  20 mg Tab Take 1 tablet by mouth at bedtime     cholecalciferol (VITAMIN D3) 1000 unit tablet Take 1,000 Units by mouth once daily     cyanocobalamin (VITAMIN B12) 1000 MCG tablet Take 1,000 mcg by mouth once daily.     FUROsemide  (LASIX ) 40 MG tablet Take 40 mg by mouth once daily     hydrocortisone  (CORTEF ) 5 MG tablet TAKE THREE TABLETS IN THE MORNING AND ONE TABLET IN THE AFTERNOON 360 tablet 3   losartan  (COZAAR ) 25 MG tablet Take 1 tablet (25 mg total) by mouth once daily Patient unsure of dosage, will confirm at next appointment.     meclizine  (ANTIVERT ) 25 mg tablet Take by mouth     metFORMIN  (GLUCOPHAGE ) 1000 MG tablet Take 1,000 mg by mouth 2 (two) times daily with meals     pregabalin  (LYRICA ) 150 MG capsule Take 1 capsule in the morning, 1 capsule in the early evening and 2 capsules at bedtime. (Make your neurology appt at [phone] for additional refills) 360 capsule 1   syringe with needle, safety 3 mL 25 gauge x 1 Syrg Use 1 each as directed (for steroid administration) 1 each 12   TORsemide  (DEMADEX ) 20 MG  tablet Take 20 mg by mouth once daily     escitalopram  oxalate (LEXAPRO ) 10 MG tablet Take 1 tablet (10 mg total) by mouth once daily for 90 days 90 tablet 0   No current facility-administered medications on file prior to visit.    Allergies as of 01/19/2024 - Reviewed 01/19/2024  Allergen Reaction Noted   Latex Rash 11/18/2016   Ciprofloxacin Rash 09/03/2017   Doxycycline  monohydrate (bulk) Rash 12/25/2016   Penicillin Rash 04/09/2015    ROS More than 10 system, review of system form was given to the patient to fill out and has been signed by [employer] FNP-C and scanned into the patient's chart.   Vital signs Vitals:   01/19/24 1113  BP: (!) 148/72  Pulse: 85  Temp: 36.5 C (97.7 F)  TempSrc: Oral  Weight: (!) 109 kg (240 lb 4.8 oz)  Height: 162.6 cm (5' 4.02)  PainSc:   7  PainLoc: Neck   Exam General: Alert oriented well-nourished no distress  Cervical Exam (performed 01/19/2024) Upon inspection there are no rashes or scars.  Cervical rotation produces mild posterior cervical spine pain.  Spurling's maneuver is negative bilaterally.  Upper Extremity Exam  She has 5/5 strength in the left wrist extensors, biceps, triceps and deltoids, shoulder internal and external rotators she has 5/5 strength in the right wrist extensor, bicep, 4/5 strength in the right tricep, deltoid, shoulder internal and external rotator (awaiting rotator cuff surgery).  Sensation intact to light touch bilaterally.  Negative Hoffmann's bilaterally.  Unable to elicit bilateral bicep, tricep and brachioradialis reflexes bilaterally.  Lumbar exam (04/22/2023) Upon inspection there are no rashes she has 2 well-healed midline surgical scars.  Mild tenderness to palpation to the lumbosacral paraspinal musculature.  Extension rotation produces mild low back pain bilaterally.  Forward flexion produces low back pain.  Lower extremity exam She has 5/5 strength in bilateral dorsiflexors, knee  extensors and hip flexors.  Sensation is intact to light touch bilaterally.  Straight  leg raise is negative bilaterally.  Full range of motion to bilateral hip joints without pain elicited.  She has absent bilateral patellar and Achilles reflexes.  No ankle clonus.  Radiographic Data Cervical and left shoulder x-rays from [facility] clinic dated 01/19/2024 imaging reviewed awaiting official radiology report.  Impression 1.  Acute on chronic bilateral low back pain without radiating pain to the lower extremities.  Clinically her symptoms are most consistent with elements of lumbar spondylosis.Lumbar spine x-ray from [facility] dated 04/16/2023 revealed previous L4-S1 fusion with noted mild increase in the lytic bone changes surrounding the L4 trans bilateral screws on the lateral view possibly representing some osteolysis reaction to the bone screw with some loosening it was noted to consider flexion-extension images.  Flexion-extension x-rays from [facility] clinic dated 04/22/2023 revealed prior fusion without evidence of complication.    2.  History of acute on chronic left lateral neck pain with radiation the shoulder and tricep to the elbow.  Clinically her symptoms are most consistent with her central and paracentral disc protrusion at C4-5 producing severe left-sided foraminal stenosis.Cervical spine x-ray from [facility] clinic dated 08/29/2021 revealed mild degenerative changes.  MRI from [facility] report dated 03/19/2015 was reviewed today.  MRI report demonstrates at C3-4 there is severe left facet arthropathy with mild left foraminal stenosis.  At C4-5 there is a central and paracentral caudal extrusion.  Probable moderate left posterior lateral and mild foraminal protrusion.  There is severe left and moderate right facet arthropathy.  There is severe left foraminal stenosis.  At C5-6 there is a mild anterior listhesis, severe right facet arthropathy.  There is no significant foraminal  stenosis.  At C6-7 there is a broad-based central and paracentral protrusion without central stenosis.  There is mild foraminal stenosis.  There is moderate left facet arthropathy.    3.  History of malignant melanoma with chemotherapy.  She is followed for a left adrenal nodule last CT abdomen and pelvis on 08/25/2021. 4.  History of elevated creatinine  Plan 1.  We will avoid NSAIDs with history of elevated creatinine 2.  Continue with occasional use of Tylenol  3.  Cervical and left shoulder x-rays obtained today will call with results. 4.  Prednisone  10 mg 6-day taper sent to pharmacy. 5.  Order placed for left C4-5 transforaminal ESI x 1 we will need clearance to stop Eliquis . 6.  She will follow-up with our office 3 weeks after epidural steroid injection for reevaluation.  Attestation Statement:   I personally performed the service, non-incident to. (WP)   WHITNEY MEELER, NP    BENTON DOWSE, NP  This note was generated in part with voice recognition software and I apologize for any typographical errors that were not detected and corrected.  PCP: [facility]

## 2024-01-25 ENCOUNTER — Ambulatory Visit

## 2024-01-25 VITALS — BP 132/70 | HR 72 | Ht 64.0 in | Wt 248.0 lb

## 2024-01-25 DIAGNOSIS — E782 Mixed hyperlipidemia: Secondary | ICD-10-CM

## 2024-01-25 DIAGNOSIS — I2510 Atherosclerotic heart disease of native coronary artery without angina pectoris: Secondary | ICD-10-CM

## 2024-01-25 DIAGNOSIS — R42 Dizziness and giddiness: Secondary | ICD-10-CM

## 2024-01-25 DIAGNOSIS — I700 Atherosclerosis of aorta: Secondary | ICD-10-CM | POA: Diagnosis not present

## 2024-01-25 DIAGNOSIS — I479 Paroxysmal tachycardia, unspecified: Secondary | ICD-10-CM

## 2024-01-25 NOTE — Progress Notes (Signed)
" °  Cardiology Office Note   Date:  01/25/2024  ID:  Sarah [name], DOB [birth year], MRN [id number] PCP: Valerio Melanie DASEN, NP  [facility] Providers Cardiologist:  Caron Poser, MD     History of Present Illness Sarah [name] is a 74 y.o. 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  Patient reports ever since she was hospitalized back in August for DVT, she has had dizziness with standing.  She denies any symptoms of vertigo.  She denies any syncope.  She denies any ongoing DOE.  She does have chronic lower extremity edema for which she takes Lasix .  Orthostatic vital signs are mildly positive today.  Interval history: Since last visit, her dizziness has improved significantly.  She is only having this once per week or less.  She tried to hold the Lasix  but had significant recurrence of lower extremity edema.  Her monitor has not resulted yet.  Relevant CVD History -Monitor pending -CTPA 08/2023 with aortic atherosclerosis and CAC -TTE 08/2023 LVEF 55 to 60%, grade 2 diastolic dysfunction, normal RV function, no significant valvular disease. On my personal review, the diastolic parameters are normal and do not show elevated filling pressures. LA size is normal. TR is trivial.   ROS: Pt denies any chest discomfort, jaw pain, arm pain, palpitations, syncope, orthopnea, PND, or LE edema.  Studies Reviewed I have independently reviewed the patient's ECG, previous medical records, recent blood work, previous cardiac testing.  Physical Exam VS:  BP 132/70 (BP Location: Left Arm, Patient Position: Sitting, Cuff Size: Large)   Pulse 72   Ht 5' 4 (1.626 m)   Wt 248 lb (112.5 kg)   SpO2 96%   BMI 42.57 kg/m         Wt Readings from Last 3 Encounters:  01/25/24 248 lb (112.5 kg)  01/18/24 245 lb 9.6 oz (111.4 kg)  01/07/24 242 lb 6.4 oz (110 kg)    GEN: No  acute distress. NECK: No JVD; No carotid bruits. CARDIAC: RRR, no murmurs, rubs, gallops. RESPIRATORY:  Clear to auscultation. EXTREMITIES:  Warm and well-perfused. No edema.  ASSESSMENT AND PLAN Dizziness Palpitations Paroxysmal tachycardia Patient presents with dizziness and palpitation.  This has improved significantly from her last visit without intervention and is now only happening once per week or less.  She does have lower extremity edema which is chronic and possibly due to May-Thurner syndrome versus chronic venous insufficiency.  She is euvolemic on exam, she has no signs or symptoms of heart failure.  Last echocardiogram was normal without obvious structural explanation.  Somewhat unclear, but I have low suspicion that this is a primary cardiac etiology and have higher suspicion that this could be related to polypharmacy or autonomic insufficiency.  Plan: - Follow-up monitor results when available - We tried to hold her Lasix  at last visit which helped with dizziness, but made the lower extremity edema significantly worse.  If she has worsening orthostasis again, would recommend trying her Lasix  at half dose - Continue trying to address polypharmacy issues if possible  CAC Aortic atherosclerosis HLD Seen on prior CT scan.  Last LDL 46 09/2023.  Continue Crestor  10 mg daily.  No aspirin  for now given chronic Eliquis  for unprovoked DVT.        Dispo: RTC 6 months or sooner PRN  Signed, Caron Poser, MD  "

## 2024-01-25 NOTE — Patient Instructions (Signed)
 Medication Instructions:  Continue current medications. *If you need a refill on your cardiac medications before your next appointment, please call your pharmacy*  Lab Work: No labs order. If you have labs (blood work) drawn today and your tests are completely normal, you will receive your results only by: MyChart Message (if you have MyChart) OR A paper copy in the mail If you have any lab test that is abnormal or we need to change your treatment, we will call you to review the results.  Testing/Procedures: No testing order.  Follow-Up: At [name], you and your health needs are our priority.  As part of our continuing mission to provide you with exceptional heart care, our providers are all part of one team.  This team includes your primary Cardiologist (physician) and Advanced Practice Providers or APPs (Physician Assistants and Nurse Practitioners) who all work together to provide you with the care you need, when you need it.  Your next appointment:   6 month(s)  Provider:   Caron Poser, MD    We recommend signing up for the patient portal called MyChart.  Sign up information is provided on this After Visit Summary.  MyChart is used to connect with patients for Virtual Visits (Telemedicine).  Patients are able to view lab/test results, encounter notes, upcoming appointments, etc.  Non-urgent messages can be sent to your provider as well.   To learn more about what you can do with MyChart, go to [email or web address].

## 2024-01-27 NOTE — Progress Notes (Signed)
 Care Guide Pharmacy Note  01/27/2024 Name: Sarah [name] MRN: [id number] DOB: 1950-09-23  Referred By: Valerio Melanie DASEN, NP Reason for referral: Complex Care Management (Outreach to schedule with Pharm d )   Sarah [name] is a 74 y.o. year old female who is a primary care patient of Cannady, [name] T, NP.  Sarah [name] was referred to the pharmacist for assistance related to: DMII  Successful contact was made with the patient to discuss pharmacy services including being ready for the pharmacist to call at least 5 minutes before the scheduled appointment time and to have medication bottles and any blood pressure readings ready for review. The patient agreed to meet with the pharmacist via telephone visit on (date/time).02/14/2024  Jeoffrey Buffalo , RMA     [place]  [facility], [facility] Guide  Direct Dial: [phone]  Website: [email or web address]

## 2024-02-01 ENCOUNTER — Telehealth: Payer: Self-pay

## 2024-02-01 DIAGNOSIS — G8929 Other chronic pain: Secondary | ICD-10-CM

## 2024-02-01 NOTE — Telephone Encounter (Signed)
 Can this referral be entered for the patient?

## 2024-02-01 NOTE — Telephone Encounter (Signed)
 Copied from CRM #[id number]. Topic: Referral - Request for Referral >> Jan 31, 2024  3:23 PM Montie POUR wrote: Did the patient discuss referral with their provider in the last year? Yes (If No - schedule appointment) (If Yes - send message)  Appointment offered? No  Type of order/referral and detailed reason for visit: On her left shoulder, she is going to have an injection. On her right shoulder, she needs to see   Preference of office, provider, location: [facility] [address] [place], [place] [address]  Office [phone] Fax [phone]  If referral order, have you been seen by this specialty before? Yes (If Yes, this issue or another issue? When? Where? Dr. Arlyss Schneider and he left the practice  Can we respond through MyChart? Yes >> Jan 31, 2024  3:33 PM Montie POUR wrote: On her right shoulder, she has been seeing Dr. Schneider and he left the practice and she needs a new referral.

## 2024-02-02 NOTE — Telephone Encounter (Signed)
 Called and notified patient that referral has been placed

## 2024-02-04 ENCOUNTER — Ambulatory Visit: Payer: Self-pay

## 2024-02-04 NOTE — Telephone Encounter (Signed)
 FYI Only or Action Required?: Action required by provider: clinical question for provider and update on patient condition.  Patient was last seen in primary care on 01/07/2024 by Valerio Melanie DASEN, NP.  Called Nurse Triage reporting Arm Pain.  Symptoms began several days ago.  Interventions attempted: OTC medications: tylenol ; ibuprofen ; vapo freeze spray and Rest, hydration, or home remedies.  Symptoms are: unchanged.  Triage Disposition: See PCP When Office is Open (Within 3 Days)  Patient/caregiver understands and will follow disposition?: No, wishes to speak with PCP      Message from [place] B sent at 02/04/2024 10:59 AM EST  Reason for Triage: patient is calling she in pain in her right arm      Reason for Disposition  [1] MODERATE pain (e.g., interferes with normal activities) AND [2] present > 3 days  Answer Assessment - Initial Assessment Questions PT called to report worsening L arm pain. Pt states she has had R arm issues that PCP referred her to see ortho for and unsure if she has overcompensated using L arm and aggravated the muscles. Pt states that the pain is in her L arm from her shoulder to elbow, no numbness, tingling or pain in lower arm. States that she noticed pain worsening on Tuesday. She has tried heat, tylenol , ibuprofen  and vapo freezer per Dr. Avanell without relief. Pt reports she does have f/u 02/03 for injection but states she cannot tolerate pain until then. Pt is requesting short term rx to help with pain relief. Pt denies radiation of pain elsewhere, no chest pain, no issues with breathing. Please advise.     1. ONSET: When did the pain start?     Tuesday  2. LOCATION: Where is the pain located?     L arm from shoulder to elbow  3. PAIN: How bad is the pain? (Scale 0-10; or none, mild, moderate, severe)     9/10; shooting pain   4. WORK OR EXERCISE: Has there been any recent work or exercise that involved this part of the body?      Pt states she has had issues with her R arm, PCP sent referral to ortho. Unsure if she has been compensating and over working her L arm. States when she got to [facility] and was trying to walk with her Rolator, the pain started and has not improved since. Pt states it is painful to even hold a fork at this time..  5. CAUSE: What do you think is causing the arm pain?     Unsure; pt does have f/u with Dr. Avanell 02/03 for injections but unsure if she has aggravated muscles at this time.   6. OTHER SYMPTOMS: Do you have any other symptoms? (e.g., neck pain, swelling, rash, fever, numbness, weakness)     No  Protocols used: Arm Pain-A-AH

## 2024-02-04 NOTE — Telephone Encounter (Signed)
 Scheduled at [facility]

## 2024-02-07 ENCOUNTER — Ambulatory Visit (INDEPENDENT_AMBULATORY_CARE_PROVIDER_SITE_OTHER): Admitting: Internal Medicine

## 2024-02-07 ENCOUNTER — Encounter: Payer: Self-pay | Admitting: Internal Medicine

## 2024-02-07 VITALS — BP 130/68 | Ht 64.0 in | Wt 250.0 lb

## 2024-02-07 DIAGNOSIS — M5412 Radiculopathy, cervical region: Secondary | ICD-10-CM

## 2024-02-07 DIAGNOSIS — M79602 Pain in left arm: Secondary | ICD-10-CM

## 2024-02-07 MED ORDER — METHOCARBAMOL 500 MG PO TABS: 500.0000 mg | ORAL_TABLET | Freq: Three times a day (TID) | ORAL | 0 refills | Status: AC | PRN

## 2024-02-07 NOTE — Patient Instructions (Signed)
 Cervical Radiculopathy  Cervical radiculopathy means that a nerve in the neck (a cervical nerve) is pinched or bruised. This can happen because of an injury to the cervical spine (vertebrae) in the neck, or as a normal part of getting older. This condition can cause pain or loss of feeling (numbness) that runs from your neck all the way down to your arm and fingers. Often, this condition gets better with rest. Treatment may be needed if the condition does not get better. What are the causes? A neck injury. A bulging disk in your spine. Sudden muscle tightening (muscle spasms). Tight muscles in your neck due to overuse. Arthritis. Breakdown in the bones and joints of the spine (spondylosis) due to getting older. Bone spurs that form near the nerves in the neck. What are the signs or symptoms? Pain. The pain may: Run from the neck to the arm and hand. Be very bad or irritating. Get worse when you move your neck. Loss of feeling or tingling in your arm or hand. Weakness in your arm or hand, in very bad cases. How is this treated? In many cases, treatment is not needed for this condition. With rest, the condition often gets better over time. If treatment is needed, options may include: Wearing a soft neck collar (cervical collar) for short periods of time. Doing exercises (physical therapy) to strengthen your neck muscles. Taking medicines. Having shots (injections) in your spine, in very bad cases. Having surgery. This may be needed if other treatments do not help. The type of surgery that is used will depend on the cause of your condition. Follow these instructions at home: If you have a soft neck collar: Wear it as told by your doctor. Take it off only as told by your doctor. Ask your doctor if you can take the collar off for cleaning and bathing. If you are allowed to take the collar off for cleaning or bathing: Follow instructions from your doctor about how to take off the collar  safely. Clean the collar by wiping it with mild soap and water and drying it completely. Take out any removable pads in the collar every 1-2 days. Wash them by hand with soap and water. Let them air-dry completely before you put them back in the collar. Check your skin under the collar for redness or sores. If you see any, tell your doctor. Managing pain     Take over-the-counter and prescription medicines only as told by your doctor. If told, put ice on the painful area. To do this: If you have a soft neck collar, take if off as told by your doctor. Put ice in a plastic bag. Place a towel between your skin and the bag. Leave the ice on for 20 minutes, 2-3 times a day. Take off the ice if your skin turns bright red. This is very important. If you cannot feel pain, heat, or cold, you have a greater risk of damage to the area. If using ice does not help, you can try using heat. Use the heat source that your doctor recommends, such as a moist heat pack or a heating pad. Place a towel between your skin and the heat source. Leave the heat on for 20-30 minutes. Take off the heat if your skin turns bright red. This is very important. If you cannot feel pain, heat, or cold, you have a greater risk of getting burned. You may try a gentle neck and shoulder rub (massage). Activity Rest as needed. Return  to your normal activities when your doctor says that it is safe. Do exercises as told by your doctor or physical therapist. You may have to avoid lifting. Ask your doctor how much you can safely lift. General instructions Use a flat pillow when you sleep. Do not drive while wearing a soft neck collar. If you do not have a soft neck collar, ask your doctor if it is safe to drive while your neck heals. Ask your doctor if you should avoid driving or using machines while you are taking your medicine. Do not smoke or use any products that contain nicotine or tobacco. If you need help quitting, ask your  doctor. Keep all follow-up visits. Contact a doctor if: Your condition does not get better with treatment. Get help right away if: Your pain gets worse and medicine does not help. You lose feeling or feel weak in your hand, arm, face, or leg. You have a high fever. Your neck is stiff. You cannot control when you poop or pee (have incontinence). You have trouble with walking, balance, or talking. Summary Cervical radiculopathy means that a nerve in the neck is pinched or bruised. A nerve can get pinched from a bulging disk, arthritis, an injury to the neck, or other causes. Symptoms include pain, tingling, or loss of feeling that goes from the neck to the arm or hand. Weakness in your arm or hand can happen in very bad cases. Treatment may include resting, wearing a soft neck collar, and doing exercises. You might need to take medicines for pain. In very bad cases, shots or surgery may be needed. This information is not intended to replace advice given to you by your health care provider. Make sure you discuss any questions you have with your health care provider. Document Revised: 07/11/2020 Document Reviewed: 07/11/2020 Elsevier Patient Education  2024 [employer].

## 2024-02-07 NOTE — Progress Notes (Signed)
 "  Subjective:    Patient ID: Sarah [name], female    DOB: March 04, 1950, 73 y.o.   MRN: [id number]  HPI   Discussed the use of AI scribe software for clinical note transcription with the patient, who gave verbal consent to proceed.  [name] is a 74 year old female with cervical radiculitis who presents with worsening left arm pain.  She has been experiencing sharp, stabbing pain in her left arm, extending from the shoulder to the elbow and recently involving the hand. The pain began before Christmas and has worsened over the past week, impacting her ability to perform movements such as putting on a coat. No numbness or tingling is associated with the pain.  She has a history of cervical radiculitis and is scheduled for an injection on February 3rd. An x-ray of her neck was performed on December 31st.   For pain management, she is currently taking Tylenol  without relief. She completed a six-day course of prednisone  four weeks ago, which did not provide any improvement. She is also taking pregabalin  (Lyrica ) 300 mg at bedtime, with additional doses in the morning and afternoon, for neuropathy in her legs and feet.  Her medical history includes diabetes with a recent A1C on 8.1%.      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     albuterol  (PROVENTIL ) (2.5 MG/3ML) 0.083% nebulizer solution Inhale 3 mLs (2.5 mg total) into the lungs every 4 (four) hours as needed for wheezing or shortness of breath. (Patient not taking: Reported on 01/25/2024) 75 mL 0   ALPRAZolam  (XANAX ) 0.5 MG tablet Take one tablet (0.5 MG) by mouth 30 minutes prior to procedure and if ongoing anxiety may repeat this 15 minutes prior to procedure (0.5 MG). 3 tablet 0   amitriptyline  (ELAVIL ) 50 MG tablet Take 1 tablet (50 mg total) by mouth at bedtime. 30 tablet 0   amLODipine  (NORVASC ) 2.5 MG tablet Take 1 tablet (2.5 mg total) by mouth daily. 30 tablet 0   apixaban  (ELIQUIS ) 5 MG TABS tablet Take 1 tablet (5 mg total) by mouth 2 (two) times daily. 180 tablet 1   Blood Glucose Monitoring Suppl (ACCU-CHEK GUIDE) w/Device KIT TO CHECK BLOOD SUGAR 2-3 TIMES DAILY AND DOCUMENT FOR VISITS. 1 kit 1   Cholecalciferol (VITAMIN D ) 50 MCG (2000 UT) CAPS Take 1 capsule by mouth at bedtime.     Cyanocobalamin (VITAMIN B-12 PO) Take 1 tablet by mouth at bedtime.     Docosanol (ABREVA) 10 % CREA Apply 1 Application topically 5 (five) times daily.  Apply to lips for cold sores (Patient not taking: Reported on 01/25/2024)     EPINEPHrine  (EPIPEN  2-PAK) 0.3 mg/0.3 mL IJ SOAJ injection Inject 0.3 mg into the muscle as needed for anaphylaxis. 2 each 0   escitalopram  (LEXAPRO ) 10 MG tablet Take 10 mg by mouth daily.     FIBER ADULT GUMMIES PO Take 1 tablet by mouth daily as needed.     furosemide  (LASIX ) 40 MG tablet Take 1 tablet (40 mg total) by mouth daily.     hydroxypropyl methylcellulose / hypromellose (ISOPTO TEARS / GONIOVISC) 2.5 % ophthalmic solution Place 2 drops into both eyes 3 (three) times daily. (Patient not taking: Reported on 01/25/2024)     insulin  glargine (LANTUS ) 100 UNIT/ML Solostar Pen Inject 8 Units into the skin daily. 10 mL 0   losartan  (COZAAR ) 100 MG  tablet Take 1 tablet (100 mg total) by mouth daily. 90 tablet 3   meclizine  (ANTIVERT ) 25 MG tablet Take 1 tablet (25 mg total) by mouth 3 (three) times daily as needed for dizziness. 60 tablet 0   metFORMIN  (GLUCOPHAGE ) 1000 MG tablet Take 1,000 mg by mouth 2 (two) times daily with a meal.     OneTouch UltraSoft 2 Lancets MISC USE TO CHECK BLOOD SUGAR 3 TIMES A DAY AND DOCUMENT RESULTS, BRING TO APPOINTMENTS. GOAL IS <130 FASTING BLOOD SUGAR AND <180 TWO HOURS AFTER MEALS. 100 each 2   pantoprazole  (PROTONIX ) 40 MG tablet Take 1 tablet (40 mg total) by mouth daily. 30 tablet 0   pregabalin  (LYRICA ) 150 MG capsule Take 300 mg by mouth at bedtime.     rosuvastatin  (CRESTOR ) 10 MG tablet Take 1 tablet (10 mg total) by mouth daily. 30 tablet 0   Semaglutide ,0.25 or 0.5MG /DOS, (OZEMPIC , 0.25 OR 0.5 MG/DOSE,) 2 MG/3ML SOPN Inject 0.5 mg into the skin once a week. 3 mL 4   Suvorexant  (BELSOMRA ) 15 MG TABS Take 1 tablet (15 mg total) by mouth at bedtime as needed. 30 tablet 0   No current facility-administered medications for this visit.    Allergies[1]  Family History  Problem Relation Age of Onset   Depression Mother    Anxiety disorder Mother    Bipolar disorder Mother    Diabetes Mother    Mental illness Mother    Heart disease Mother    Stroke Mother    Lung cancer Father    Diabetes Maternal Grandmother    Heart attack Maternal Grandmother    CVA Paternal Grandmother    Colon cancer Neg Hx    Liver cancer Neg Hx    Pancreatic cancer Neg Hx    Esophageal cancer Neg Hx    Stomach cancer Neg Hx    Rectal cancer Neg Hx    Colon polyps Neg Hx    Breast cancer Neg Hx     Social History   Socioeconomic History   Marital status: Widowed    Spouse name: Not on file   Number of children: 1   Years of education: 15   Highest education level: High school graduate  Occupational History   Occupation: Retired  Tobacco Use   Smoking status: Never    Passive exposure: Never    Smokeless tobacco: Never  Vaping Use   Vaping status: Never Used  Substance and Sexual Activity   Alcohol use: Not Currently   Drug use: Never   Sexual activity: Not Currently  Other Topics Concern   Not on file  Social History Narrative  Daughter lives in [place].  Patient lives [address] in [place].    Social Drivers of Health   Tobacco Use: Low Risk (01/25/2024)   Patient History    Smoking Tobacco Use: Never    Smokeless Tobacco Use: Never    Passive Exposure: Never  Financial Resource Strain: Low Risk  (10/24/2023)   Received from [facility]   Overall Financial Resource Strain (CARDIA)    Difficulty of Paying Living Expenses: Not hard at all  Food Insecurity: No Food Insecurity (12/23/2023)   Epic    Worried About Radiation Protection Practitioner of Food in the Last Year: Never true    Ran Out of Food in the Last Year: Never true  Transportation Needs: No Transportation Needs (12/23/2023)   Epic    Lack of Transportation (Medical): No    Lack of Transportation (Non-Medical): No  Physical Activity: Sufficiently Active (12/23/2023)   Exercise Vital Sign    Days of Exercise per Week: 3 days    Minutes of Exercise per Session: 60 min  Stress: No Stress Concern Present (12/23/2023)   [employer] of Occupational Health - Occupational Stress Questionnaire    Feeling of Stress: Not at all  Social Connections: Moderately Integrated (12/23/2023)   Social Connection and Isolation Panel    Frequency of Communication with Friends and Family: More than three times a week    Frequency of Social Gatherings with Friends and Family: More than three times a week    Attends Religious Services: More than 4 times per year    Active Member of [employer] or Organizations: Yes    Attends Banker Meetings: More than 4 times per year    Marital Status: Widowed  Intimate Partner Violence: Not At Risk (12/23/2023)   Epic    Fear of Current or Ex-Partner: No     Emotionally Abused: No    Physically Abused: No    Sexually Abused: No  Depression (PHQ2-9): Low Risk (01/07/2024)   Depression (PHQ2-9)    PHQ-2 Score: 2  Recent Concern: Depression (PHQ2-9) - Medium Risk (11/15/2023)   Depression (PHQ2-9)    PHQ-2 Score: 5  Alcohol Screen: Low Risk (09/29/2022)   Alcohol Screen    Last Alcohol Screening Score (AUDIT): 0  Housing: Low Risk (12/23/2023)   Epic    Unable to Pay for Housing in the Last Year: No    Number of Times Moved in the Last Year: 0    Homeless in the Last Year: No  Utilities: Not At Risk (12/23/2023)   Epic    Threatened with loss of utilities: No  Health Literacy: Adequate Health Literacy (12/23/2023)   B1300 Health Literacy    Frequency of need for help with medical instructions: Never     Constitutional: Denies fever, malaise, fatigue, headache or abrupt weight changes.  Respiratory: Denies difficulty breathing, shortness of breath, cough or sputum production.   Cardiovascular: Denies chest pain, chest tightness, palpitations or swelling in the hands or feet.  Musculoskeletal: Patient reports left upper arm pain, decrease in range of motion, chronic neck pain, difficulty with gait..  Denies joint swelling.  Skin: Denies redness, rashes, lesions or ulcercations.  Neurological: Denies numbness, tingling, weakness or problems with balance and coordination.    No other specific complaints in a complete review of systems (except as listed in HPI above).  Objective   BP 130/68 (BP Location: Left Arm, Patient Position: Sitting, Cuff Size: Large)   Ht 5' 4 (1.626 m)  Wt 250 lb (113.4 kg)   BMI 42.91 kg/m   Wt Readings from Last 3 Encounters:  01/25/24 248 lb (112.5 kg)  01/18/24 245 lb 9.6 oz (111.4 kg)  01/07/24 242 lb 6.4 oz (110 kg)    General: Appears her stated age, obese, chronically ill-appearing, in NAD. Skin: Warm, dry and intact. No rashes noted. Cardiovascular: Normal rate and rhythm.  Radial pulse 2+ on  the left. Pulmonary/Chest: Normal effort and positive vesicular breath sounds. No respiratory distress. No wheezes, rales or ronchi noted.  Musculoskeletal: Normal flexion, extension, rotation of the cervical spine.  No pain with palpation over the cervical spine.  Decreased internal and external rotation of the left shoulder.  Pain with palpation of the left AC joint.  Shoulder shrug equal.  Strength 5/5 BUE.  Handgrips equal.  Gait slow and steady with use of rolling walker. Neurological: Alert and oriented. Coordination normal of the left hand.    BMET    Component Value Date/Time   NA 139 12/09/2023 1043   NA 146 (H) 06/04/2011 0148   K 4.6 12/09/2023 1043   K 3.4 (L) 06/04/2011 0148   CL 99 12/09/2023 1043   CL 110 (H) 06/04/2011 0148   CO2 20 12/09/2023 1043   CO2 26 06/04/2011 0148   GLUCOSE 185 (H) 12/09/2023 1043   GLUCOSE 254 (H) 12/02/2023 1131   GLUCOSE 111 (H) 06/04/2011 0148   BUN 19 12/09/2023 1043   BUN 10 06/04/2011 0148   CREATININE 1.31 (H) 12/09/2023 1043   CREATININE 0.97 11/25/2023 1357   CALCIUM  9.7 12/09/2023 1043   CALCIUM  7.5 (L) 06/04/2011 0148   GFRNONAA >60 12/02/2023 1131   GFRNONAA 67 04/23/2020 1150   GFRAA 77 04/23/2020 1150    Lipid Panel     Component Value Date/Time   CHOL 148 12/09/2023 1043   CHOL 191 06/04/2011 0148   TRIG 120 12/09/2023 1043   TRIG 256 (H) 06/04/2011 0148   HDL 52 12/09/2023 1043   HDL 34 (L) 06/04/2011 0148   CHOLHDL 4.1 04/23/2020 1150   VLDL 28 07/20/2016 1035   VLDL 51 (H) 06/04/2011 0148   LDLCALC 75 12/09/2023 1043   LDLCALC 150 (H) 04/23/2020 1150   LDLCALC 106 (H) 06/04/2011 0148    CBC    Component Value Date/Time   WBC 10.0 12/09/2023 1043   WBC 14.9 (H) 12/02/2023 1131   RBC 4.15 12/09/2023 1043   RBC 4.17 12/02/2023 1131   HGB 11.4 12/09/2023 1043   HCT 37.1 12/09/2023 1043   PLT 322 12/09/2023 1043   MCV 89 12/09/2023 1043   MCV 88 06/04/2011 0148   MCH 27.5 12/09/2023 1043   MCH 27.6  12/02/2023 1131   MCHC 30.7 (L) 12/09/2023 1043   MCHC 31.6 12/02/2023 1131   RDW 14.1 12/09/2023 1043   RDW 13.2 06/04/2011 0148   LYMPHSABS 1.5 12/09/2023 1043   LYMPHSABS 2.0 06/04/2011 0148   MONOABS 0.7 12/02/2023 1131   MONOABS 0.4 06/04/2011 0148   EOSABS 0.2 12/09/2023 1043   EOSABS 0.2 06/04/2011 0148   BASOSABS 0.1 12/09/2023 1043   BASOSABS 0.1 06/04/2011 0148    Hgb A1C Lab Results  Component Value Date   HGBA1C 8.1 (H) 12/09/2023       Assessment and Plan   Assessment and Plan    Cervical radiculopathy Chronic cervical radiculopathy with worsening left arm pain, likely related to cervical radiculopathy. Previous prednisone  ineffective. Current pain management insufficient. Scheduled for cervical injection on February  3rd. - Continue Tylenol  1000 mg every 8 hours. - Continue pregabalin  300 mg at bedtime. - Prescribed methocarbamol  500 mg every 8 hours as needed, cautioning drowsiness and driving. - Advised to contact Dr. Gwenette office if symptoms do not improve before injection.      Follow-up with your PCP as previously scheduled  Angeline Laura, NP     [1]  Allergies Allergen Reactions   Clindamycin/Lincomycin Anaphylaxis   Doxycycline  Rash   Aspirin  Other (See Comments)    Pt is unsure of reaction, but states that she is allergic to aspirin    Ciprofloxacin Rash    very bad rash   Penicillins Rash    Has patient had a PCN reaction causing immediate rash, facial/tongue/throat swelling, SOB or lightheadedness with hypotension: No Has patient had a PCN reaction causing severe rash involving mucus membranes or skin necrosis: No Has patient had a PCN reaction that required hospitalization: No Has patient had a PCN reaction occurring within the last 10 years: No If all of the above answers are NO, then may proceed with Cephalosporin use.    "

## 2024-02-09 ENCOUNTER — Ambulatory Visit

## 2024-02-10 ENCOUNTER — Encounter: Payer: Self-pay | Admitting: Behavioral Health

## 2024-02-10 ENCOUNTER — Ambulatory Visit: Admitting: Behavioral Health

## 2024-02-10 DIAGNOSIS — F99 Mental disorder, not otherwise specified: Secondary | ICD-10-CM

## 2024-02-10 DIAGNOSIS — F411 Generalized anxiety disorder: Secondary | ICD-10-CM

## 2024-02-10 DIAGNOSIS — F5105 Insomnia due to other mental disorder: Secondary | ICD-10-CM

## 2024-02-10 DIAGNOSIS — F3341 Major depressive disorder, recurrent, in partial remission: Secondary | ICD-10-CM

## 2024-02-10 MED ORDER — AMITRIPTYLINE HCL 50 MG PO TABS: 50.0000 mg | ORAL_TABLET | Freq: Every day | ORAL | 3 refills | Status: AC

## 2024-02-10 MED ORDER — ESCITALOPRAM OXALATE 10 MG PO TABS: 10.0000 mg | ORAL_TABLET | Freq: Every day | ORAL | 1 refills | Status: AC

## 2024-02-10 MED ORDER — BELSOMRA 15 MG PO TABS: 15.0000 mg | ORAL_TABLET | Freq: Every evening | ORAL | 1 refills | Status: AC | PRN

## 2024-02-10 NOTE — Progress Notes (Signed)
 "     [facility]  Patient ID: Sarah [name],  MRN: [id number]  PCP: Valerio Melanie DASEN, NP  Date of Evaluation: 02/10/2024 Time spent:30 minutes  Chief Complaint:  Chief Complaint   Depression; Anxiety; Follow-up; Medication Refill; Patient Education; Medication Problem     HISTORY/CURRENT STATUS: HPI  24, 75 year old female presents to this office for follow up and medication management.  Patient reports having a very tough year.  She has had multiple health issues and hospitalizations since last visit.  Has pending right shoulder surgery.  However she says that she continues to feel stable with Lexapro  and does not need any changes to her medications at this time.  Still enjoying the retirement community. Staying active. Participating in group trip to see shows etc.  She reports her anxiety at 1/10 and depression 1/10. Sleeping well with aid of Belsomra .  Appetite is good.  She is currently on Ozempic  for weight loss.  Denies mania, no psychosis. No SI/HI.     Individual Medical History/ Review of Systems: Changes? :No   Allergies: Clindamycin/lincomycin, Doxycycline , Aspirin , Ciprofloxacin, and Penicillins  Current Medications: Current Medications[1] Medication Side Effects: none  Family Medical/ Social History: Changes? No  MENTAL HEALTH EXAM:  There were no vitals taken for this visit.There is no height or weight on file to calculate BMI.  General Appearance: Casual, Neat, and Well Groomed  Eye Contact:  Good  Speech:  Clear and Coherent  Volume:  Normal  Mood:  NA  Affect:  Appropriate  Thought Process:  Coherent  Orientation:  Full (Time, Place, and Person)  Thought Content: Logical   Suicidal Thoughts:  No  Homicidal Thoughts:  No  Memory:  WNL  Judgement:  Good  Insight:  Good  Psychomotor Activity:  Normal  Concentration:  Concentration: Good  Recall:  Good  Fund of Knowledge: Good  Language: Good  Assets:  Desire for Improvement  ADL's:   Intact  Cognition: WNL  Prognosis:  Good    DIAGNOSES:    ICD-10-CM   1. Generalized anxiety disorder  F41.1     2. Insomnia due to other mental disorder  F51.05 amitriptyline  (ELAVIL ) 50 MG tablet   F99 Suvorexant  (BELSOMRA ) 15 MG TABS    3. Recurrent major depressive disorder, in partial remission  F33.41       Receiving Psychotherapy: No    RECOMMENDATIONS:  Greater than 50% of 30 min face to face time with patient was spent on counseling and coordination of care.  We discussed her hospitalizations and multiple health issues.  She is currently ambulating with a rollator.  Still reporting low-level anxiety and depression.  She is happy with her medication and is requesting no changes at this time.   Reinforced documented previous and current flagged warning of  QT prolongation and risk with Lexapro . She has been on the medication as reported by Pt since 2018.    To continue Lexapro  10 mg daily To continue Belsomra  20 mg at bedtime To continue Amitriptyline  50 mg at bedtime Will report any worsening symptoms promptly To follow up in 6 months to reassess per pt request Provided emergency contact information Reviewed PDMP      Redell DELENA Pizza, NP     [1]  Current Outpatient Medications:    acetaminophen  (TYLENOL ) 325 MG tablet, Take 650 mg by mouth every 4 (four) hours as needed., Disp: , Rfl:    albuterol  (PROVENTIL ) (2.5 MG/3ML) 0.083% nebulizer solution, Inhale 3 mLs (2.5  mg total) into the lungs every 4 (four) hours as needed for wheezing or shortness of breath. (Patient not taking: Reported on 02/07/2024), Disp: 75 mL, Rfl: 0   ALPRAZolam  (XANAX ) 0.5 MG tablet, Take one tablet (0.5 MG) by mouth 30 minutes prior to procedure and if ongoing anxiety may repeat this 15 minutes prior to procedure (0.5 MG)., Disp: 3 tablet, Rfl: 0   amitriptyline  (ELAVIL ) 50 MG tablet, Take 1 tablet (50 mg total) by mouth at bedtime., Disp: 30 tablet, Rfl: 3   amLODipine  (NORVASC ) 2.5 MG tablet,  Take 1 tablet (2.5 mg total) by mouth daily., Disp: 30 tablet, Rfl: 0   apixaban  (ELIQUIS ) 5 MG TABS tablet, Take 1 tablet (5 mg total) by mouth 2 (two) times daily., Disp: 180 tablet, Rfl: 1   Blood Glucose Monitoring Suppl (ACCU-CHEK GUIDE) w/Device KIT, TO CHECK BLOOD SUGAR 2-3 TIMES DAILY AND DOCUMENT FOR VISITS., Disp: 1 kit, Rfl: 1   Cholecalciferol (VITAMIN D ) 50 MCG (2000 UT) CAPS, Take 1 capsule by mouth at bedtime., Disp: , Rfl:    Cyanocobalamin (VITAMIN B-12 PO), Take 1 tablet by mouth at bedtime., Disp: , Rfl:    Docosanol (ABREVA) 10 % CREA, Apply 1 Application topically 5 (five) times daily. Apply to lips for cold sores (Patient not taking: Reported on 02/07/2024), Disp: , Rfl:    EPINEPHrine  (EPIPEN  2-PAK) 0.3 mg/0.3 mL IJ SOAJ injection, Inject 0.3 mg into the muscle as needed for anaphylaxis., Disp: 2 each, Rfl: 0   escitalopram  (LEXAPRO ) 10 MG tablet, Take 1 tablet (10 mg total) by mouth daily., Disp: 90 tablet, Rfl: 1   FIBER ADULT GUMMIES PO, Take 1 tablet by mouth daily as needed., Disp: , Rfl:    furosemide  (LASIX ) 40 MG tablet, Take 1 tablet (40 mg total) by mouth daily., Disp: , Rfl:    hydroxypropyl methylcellulose / hypromellose (ISOPTO TEARS / GONIOVISC) 2.5 % ophthalmic solution, Place 2 drops into both eyes 3 (three) times daily. (Patient not taking: Reported on 02/07/2024), Disp: , Rfl:    insulin  glargine (LANTUS ) 100 UNIT/ML Solostar Pen, Inject 8 Units into the skin daily., Disp: 10 mL, Rfl: 0   losartan  (COZAAR ) 100 MG tablet, Take 1 tablet (100 mg total) by mouth daily., Disp: 90 tablet, Rfl: 3   meclizine  (ANTIVERT ) 25 MG tablet, Take 1 tablet (25 mg total) by mouth 3 (three) times daily as needed for dizziness., Disp: 60 tablet, Rfl: 0   metFORMIN  (GLUCOPHAGE ) 1000 MG tablet, Take 1,000 mg by mouth 2 (two) times daily with a meal., Disp: , Rfl:    methocarbamol  (ROBAXIN ) 500 MG tablet, Take 1 tablet (500 mg total) by mouth every 8 (eight) hours as needed., Disp: 30  tablet, Rfl: 0   OneTouch UltraSoft 2 Lancets MISC, USE TO CHECK BLOOD SUGAR 3 TIMES A DAY AND DOCUMENT RESULTS, BRING TO APPOINTMENTS. GOAL IS <130 FASTING BLOOD SUGAR AND <180 TWO HOURS AFTER MEALS., Disp: 100 each, Rfl: 2   pantoprazole  (PROTONIX ) 40 MG tablet, Take 1 tablet (40 mg total) by mouth daily., Disp: 30 tablet, Rfl: 0   pregabalin  (LYRICA ) 150 MG capsule, Take 300 mg by mouth at bedtime., Disp: , Rfl:    rosuvastatin  (CRESTOR ) 10 MG tablet, Take 1 tablet (10 mg total) by mouth daily., Disp: 30 tablet, Rfl: 0   Semaglutide ,0.25 or 0.5MG /DOS, (OZEMPIC , 0.25 OR 0.5 MG/DOSE,) 2 MG/3ML SOPN, Inject 0.5 mg into the skin once a week., Disp: 3 mL, Rfl: 4   Suvorexant  (BELSOMRA ) 15  MG TABS, Take 1 tablet (15 mg total) by mouth at bedtime as needed., Disp: 90 tablet, Rfl: 1  "

## 2024-02-14 ENCOUNTER — Other Ambulatory Visit: Payer: Self-pay

## 2024-02-14 NOTE — Progress Notes (Unsigned)
" ° °  02/14/2024  Patient ID: Sarah [name], female   DOB: 1950/03/15, 74 y.o.   MRN: [id number]  Unsuccessful outreach attempt for telephone visit.  I tried to call home x2, but did not get an answer and no voicemail was set up.  I was able to leave a HIPAA compliant voicemail with my direct number on patient's mobile number.  Care guide will attempt to reschedule visit.  Sarah [name], PharmD, DPLA  "

## 2024-02-18 ENCOUNTER — Emergency Department
Admission: EM | Admit: 2024-02-18 | Discharge: 2024-02-19 | Disposition: A | Attending: Emergency Medicine | Admitting: Emergency Medicine

## 2024-02-18 ENCOUNTER — Emergency Department

## 2024-02-18 ENCOUNTER — Other Ambulatory Visit: Payer: Self-pay

## 2024-02-18 ENCOUNTER — Telehealth: Payer: Self-pay

## 2024-02-18 DIAGNOSIS — W19XXXA Unspecified fall, initial encounter: Secondary | ICD-10-CM

## 2024-02-18 DIAGNOSIS — S0081XA Abrasion of other part of head, initial encounter: Secondary | ICD-10-CM | POA: Insufficient documentation

## 2024-02-18 DIAGNOSIS — I10 Essential (primary) hypertension: Secondary | ICD-10-CM | POA: Insufficient documentation

## 2024-02-18 DIAGNOSIS — S0990XA Unspecified injury of head, initial encounter: Secondary | ICD-10-CM | POA: Insufficient documentation

## 2024-02-18 DIAGNOSIS — Z7901 Long term (current) use of anticoagulants: Secondary | ICD-10-CM | POA: Insufficient documentation

## 2024-02-18 DIAGNOSIS — W000XXA Fall on same level due to ice and snow, initial encounter: Secondary | ICD-10-CM | POA: Insufficient documentation

## 2024-02-18 DIAGNOSIS — E119 Type 2 diabetes mellitus without complications: Secondary | ICD-10-CM | POA: Insufficient documentation

## 2024-02-18 LAB — CBC WITH DIFFERENTIAL/PLATELET
Abs Immature Granulocytes: 0.17 10*3/uL — ABNORMAL HIGH (ref 0.00–0.07)
Basophils Absolute: 0.1 10*3/uL (ref 0.0–0.1)
Basophils Relative: 1 %
Eosinophils Absolute: 0.1 10*3/uL (ref 0.0–0.5)
Eosinophils Relative: 1 %
HCT: 33.8 % — ABNORMAL LOW (ref 36.0–46.0)
Hemoglobin: 11 g/dL — ABNORMAL LOW (ref 12.0–15.0)
Immature Granulocytes: 2 %
Lymphocytes Relative: 25 %
Lymphs Abs: 2 10*3/uL (ref 0.7–4.0)
MCH: 27.6 pg (ref 26.0–34.0)
MCHC: 32.5 g/dL (ref 30.0–36.0)
MCV: 84.9 fL (ref 80.0–100.0)
Monocytes Absolute: 0.5 10*3/uL (ref 0.1–1.0)
Monocytes Relative: 6 %
Neutro Abs: 5.5 10*3/uL (ref 1.7–7.7)
Neutrophils Relative %: 65 %
Platelets: 248 10*3/uL (ref 150–400)
RBC: 3.98 MIL/uL (ref 3.87–5.11)
RDW: 15.1 % (ref 11.5–15.5)
WBC: 8.3 10*3/uL (ref 4.0–10.5)
nRBC: 0.2 % (ref 0.0–0.2)

## 2024-02-18 LAB — BASIC METABOLIC PANEL WITH GFR
Anion gap: 16 — ABNORMAL HIGH (ref 5–15)
BUN: 17 mg/dL (ref 8–23)
CO2: 27 mmol/L (ref 22–32)
Calcium: 7.9 mg/dL — ABNORMAL LOW (ref 8.9–10.3)
Chloride: 99 mmol/L (ref 98–111)
Creatinine, Ser: 1.12 mg/dL — ABNORMAL HIGH (ref 0.44–1.00)
GFR, Estimated: 52 mL/min — ABNORMAL LOW
Glucose, Bld: 211 mg/dL — ABNORMAL HIGH (ref 70–99)
Potassium: 3.6 mmol/L (ref 3.5–5.1)
Sodium: 142 mmol/L (ref 135–145)

## 2024-02-18 NOTE — Progress Notes (Unsigned)
 Complex Care Management Care Guide Note  02/18/2024 Name: Sarah [name] MRN: [id number] DOB: [birth year]  Sarah [name] is a 74 y.o. year old female who is a primary care patient of Cannady, [name] T, NP and is actively engaged with the care management team. I reached out to [name] by phone today to assist with re-scheduling  with the Pharmacist.  Follow up plan: Unsuccessful telephone outreach attempt made. A HIPAA compliant phone message was left for the patient providing contact information and requesting a return call.  Jeoffrey Buffalo , RMA     [facility]  [facility], [facility] Guide  Direct Dial: [phone]  Website: [email or web address]

## 2024-02-18 NOTE — ED Triage Notes (Addendum)
 First Nurse Note:  BIB [facility] from [facility] independent house  Pt reportedly slipped on ice and fell. Abrasions and contusions for forehead. Abrasions to bilat knees and L knuckles. Hit head. No LOC. Daily eliquis . Alert and oriented x4. All bleeding controlled.   EMS VS: 140/68 HR 76 96% RA CBG 248

## 2024-02-18 NOTE — ED Triage Notes (Signed)
 Pt states she stepped down and slipped on ice. Fell onto both needs and then hit left side of face. Abrasions to face. Pt is on blood thinner. No LOC

## 2024-02-19 NOTE — Discharge Instructions (Signed)
 Return to the ER for new, worsening, or persistent severe headache, dizziness, vomiting, or any other new or worsening symptoms that concern you.  You may apply bacitracin or Neosporin ointment to the abrasions on your face and hands.

## 2024-02-19 NOTE — ED Provider Notes (Signed)
 "  [facility] Provider Note    Event Date/Time   First MD Initiated Contact with Patient 02/18/24 2342     (approximate)   History   Fall   HPI  Sarah [name] is a 74 y.o. female with a history of DVT, hypertension, hyperlipidemia, diabetes, and Addison disease, on Eliquis , who presents with a head injury.  The patient states that she had a mechanical slip and fall on the ice around 7 PM.  She fell forward hitting the front of her head.  She did not lose consciousness.  She denies any severe headache or vomiting.  She also reports some pain to bilateral knees and her hands after the fall.  She has no pain in the neck.  I reviewed the past medical records.  The patient was seen by family medicine on 1/19 for follow-up of cervical radiculitis with worsening arm pain.   Physical Exam   Triage Vital Signs: ED Triage Vitals [02/18/24 1947]  Encounter Vitals Group     BP 130/71     Girls Systolic BP Percentile      Girls Diastolic BP Percentile      Boys Systolic BP Percentile      Boys Diastolic BP Percentile      Pulse Rate 76     Resp 18     Temp 98.6 F (37 C)     Temp Source Oral     SpO2 100 %     Weight      Height      Head Circumference      Peak Flow      Pain Score 4     Pain Loc      Pain Education      Exclude from Growth Chart     Most recent vital signs: Vitals:   02/19/24 0007 02/19/24 0008  BP:  132/65  Pulse: 78 78  Resp: 18 18  Temp:  98 F (36.7 C)  SpO2: 94% 94%     General: Alert and oriented, well-appearing, no distress.  CV:  Good peripheral perfusion.  Resp:  Normal effort.  Abd:  No distention.  Other:  EOMI.  PERRLA.  No photophobia.  No facial droop.  Normal speech.  Motor intact in all extremities.  No ataxia.  Multiple facial abrasions and forehead hematoma.  Few scattered abrasions to the fingers.  Bilateral hips and knees with full range of motion.  No midline cervical spinal tenderness.   ED Results /  Procedures / Treatments   Labs (all labs ordered are listed, but only abnormal results are displayed) Labs Reviewed  CBC WITH DIFFERENTIAL/PLATELET - Abnormal; Notable for the following components:      Result Value   Hemoglobin 11.0 (*)    HCT 33.8 (*)    Abs Immature Granulocytes 0.17 (*)    All other components within normal limits  BASIC METABOLIC PANEL WITH GFR - Abnormal; Notable for the following components:   Glucose, Bld 211 (*)    Creatinine, Ser 1.12 (*)    Calcium  7.9 (*)    GFR, Estimated 52 (*)    Anion gap 16 (*)    All other components within normal limits     EKG     RADIOLOGY  CT head: I independently viewed and interpreted the images; there is a scalp hematoma with no ICH.  Radiology report indicates no acute intracranial abnormality.  CT maxillofacial: No acute fracture  CT cervical spine: No acute  fracture   PROCEDURES:  Critical Care performed: No  Procedures   MEDICATIONS ORDERED IN ED: Medications - No data to display   IMPRESSION / MDM / ASSESSMENT AND PLAN / ED COURSE  I reviewed the triage vital signs and the nursing notes.  74 year old female with PMH as noted above presents after mechanical fall from standing height with abrasions to her forehead.  She is on Eliquis .  Differential diagnosis includes, but is not limited to, minor head injury, concussion, TBI, facial fracture, cervical spine fracture.  Patient's presentation is most consistent with acute presentation with potential threat to life or bodily function.  BMP and CBC were obtained from triage and are unremarkable except for borderline elevated anion gap which is nonspecific.  The patient's vital signs are normal and she is otherwise asymptomatic.  There is no indication for further workup of this.  CT head, cervical spine, and maxillofacial are all negative for acute traumatic findings.  At this time it has been over 4 hours since the fall.  The patient is stable for  discharge home.  I counseled her on the results of the workup and answered all of her questions.  I gave strict return precautions, and she expressed understanding.   FINAL CLINICAL IMPRESSION(S) / ED DIAGNOSES   Final diagnoses:  Fall, initial encounter  Injury of head, initial encounter  Abrasion of face, initial encounter     Rx / DC Orders   ED Discharge Orders     None        Note:  This document was prepared using Dragon voice recognition software and may include unintentional dictation errors.    Jacolyn Pae, MD 02/19/24 [phone]  "

## 2024-02-19 NOTE — ED Notes (Signed)
 [facility] called x2 for pt transport. Security in route for transport.

## 2024-02-21 NOTE — Progress Notes (Signed)
 Complex Care Management Care Guide Note  02/21/2024 Name: Virga Haltiwanger MRN: [id number] DOB: 1950/12/31  Alessa Mazur is a 74 y.o. year old female who is a primary care patient of Cannady, [name] T, NP and is actively engaged with the care management team. I reached out to [name] by phone today to assist with re-scheduling  with the Pharmacist.  Follow up plan: Telephone appointment with complex care management team member scheduled for:  03/03/2024  Jeoffrey Buffalo , RMA     [place], [facility] Guide  Direct Dial: [phone]  Website: [email or web address]

## 2024-02-25 ENCOUNTER — Other Ambulatory Visit: Payer: Self-pay | Admitting: Student

## 2024-02-25 DIAGNOSIS — G8929 Other chronic pain: Secondary | ICD-10-CM

## 2024-02-25 DIAGNOSIS — M7521 Bicipital tendinitis, right shoulder: Secondary | ICD-10-CM

## 2024-02-25 DIAGNOSIS — M75121 Complete rotator cuff tear or rupture of right shoulder, not specified as traumatic: Secondary | ICD-10-CM

## 2024-03-03 ENCOUNTER — Other Ambulatory Visit: Payer: Self-pay

## 2024-03-13 ENCOUNTER — Ambulatory Visit: Admitting: Nurse Practitioner

## 2024-05-19 ENCOUNTER — Ambulatory Visit (INDEPENDENT_AMBULATORY_CARE_PROVIDER_SITE_OTHER): Admitting: Vascular Surgery

## 2024-08-09 ENCOUNTER — Ambulatory Visit: Admitting: Behavioral Health

## 2024-12-26 ENCOUNTER — Ambulatory Visit
